# Patient Record
Sex: Female | Born: 1940 | Race: Black or African American | Hispanic: No | Smoking: Never smoker
Health system: Southern US, Community
[De-identification: ages and names within clinical notes are randomized; demographics above are authoritative.]

## PROBLEM LIST (undated history)

## (undated) DIAGNOSIS — I119 Hypertensive heart disease without heart failure: Secondary | ICD-10-CM

## (undated) DIAGNOSIS — E119 Type 2 diabetes mellitus without complications: Secondary | ICD-10-CM

## (undated) DIAGNOSIS — I5189 Other ill-defined heart diseases: Secondary | ICD-10-CM

## (undated) DIAGNOSIS — M8580 Other specified disorders of bone density and structure, unspecified site: Secondary | ICD-10-CM

## (undated) DIAGNOSIS — IMO0001 Reserved for inherently not codable concepts without codable children: Secondary | ICD-10-CM

## (undated) DIAGNOSIS — I700 Atherosclerosis of aorta: Secondary | ICD-10-CM

## (undated) DIAGNOSIS — I455 Other specified heart block: Secondary | ICD-10-CM

## (undated) DIAGNOSIS — N6009 Solitary cyst of unspecified breast: Secondary | ICD-10-CM

## (undated) DIAGNOSIS — M1990 Unspecified osteoarthritis, unspecified site: Secondary | ICD-10-CM

## (undated) DIAGNOSIS — I2510 Atherosclerotic heart disease of native coronary artery without angina pectoris: Secondary | ICD-10-CM

## (undated) DIAGNOSIS — K219 Gastro-esophageal reflux disease without esophagitis: Secondary | ICD-10-CM

## (undated) DIAGNOSIS — E785 Hyperlipidemia, unspecified: Secondary | ICD-10-CM

## (undated) DIAGNOSIS — I4719 Other supraventricular tachycardia: Secondary | ICD-10-CM

## (undated) DIAGNOSIS — G629 Polyneuropathy, unspecified: Secondary | ICD-10-CM

## (undated) DIAGNOSIS — Q7649 Other congenital malformations of spine, not associated with scoliosis: Secondary | ICD-10-CM

## (undated) DIAGNOSIS — N183 Chronic kidney disease, stage 3 unspecified: Secondary | ICD-10-CM

## (undated) DIAGNOSIS — T4145XA Adverse effect of unspecified anesthetic, initial encounter: Secondary | ICD-10-CM

## (undated) DIAGNOSIS — I10 Essential (primary) hypertension: Secondary | ICD-10-CM

## (undated) DIAGNOSIS — T7840XA Allergy, unspecified, initial encounter: Secondary | ICD-10-CM

## (undated) DIAGNOSIS — I471 Supraventricular tachycardia: Secondary | ICD-10-CM

## (undated) DIAGNOSIS — D649 Anemia, unspecified: Secondary | ICD-10-CM

## (undated) HISTORY — PX: ABDOMINAL HYSTERECTOMY: SHX81

## (undated) HISTORY — DX: Solitary cyst of unspecified breast: N60.09

## (undated) HISTORY — DX: Supraventricular tachycardia: I47.1

## (undated) HISTORY — DX: Other supraventricular tachycardia: I47.19

## (undated) HISTORY — DX: Hypertensive heart disease without heart failure: I11.9

## (undated) HISTORY — DX: Anemia, unspecified: D64.9

## (undated) HISTORY — DX: Hyperlipidemia, unspecified: E78.5

## (undated) HISTORY — DX: Atherosclerotic heart disease of native coronary artery without angina pectoris: I25.10

## (undated) HISTORY — PX: BREAST LUMPECTOMY: SHX2

## (undated) HISTORY — DX: Other ill-defined heart diseases: I51.89

## (undated) HISTORY — DX: Essential (primary) hypertension: I10

## (undated) HISTORY — PX: ABDOMINAL ADHESION SURGERY: SHX90

## (undated) HISTORY — DX: Type 2 diabetes mellitus without complications: E11.9

## (undated) HISTORY — PX: COLONOSCOPY: SHX5424

## (undated) HISTORY — DX: Other specified disorders of bone density and structure, unspecified site: M85.80

## (undated) HISTORY — DX: Unspecified osteoarthritis, unspecified site: M19.90

## (undated) HISTORY — PX: ANUS SURGERY: SHX302

## (undated) HISTORY — DX: Chronic kidney disease, stage 3 unspecified: N18.30

## (undated) HISTORY — PX: BREAST BIOPSY: SHX20

## (undated) HISTORY — PX: APPENDECTOMY: SHX54

## (undated) HISTORY — DX: Atherosclerosis of aorta: I70.0

## (undated) HISTORY — DX: Other specified heart block: I45.5

---

## 1984-01-12 DIAGNOSIS — T8859XA Other complications of anesthesia, initial encounter: Secondary | ICD-10-CM

## 1984-01-12 HISTORY — DX: Other complications of anesthesia, initial encounter: T88.59XA

## 1984-01-12 HISTORY — PX: CHOLECYSTECTOMY: SHX55

## 1997-07-01 ENCOUNTER — Ambulatory Visit (HOSPITAL_COMMUNITY): Admission: RE | Admit: 1997-07-01 | Discharge: 1997-07-01 | Payer: Self-pay | Admitting: Rheumatology

## 1997-11-06 ENCOUNTER — Ambulatory Visit (HOSPITAL_BASED_OUTPATIENT_CLINIC_OR_DEPARTMENT_OTHER): Admission: RE | Admit: 1997-11-06 | Discharge: 1997-11-06 | Payer: Self-pay | Admitting: Orthopedic Surgery

## 1999-08-19 ENCOUNTER — Encounter: Payer: Self-pay | Admitting: Internal Medicine

## 1999-08-19 ENCOUNTER — Encounter: Admission: RE | Admit: 1999-08-19 | Discharge: 1999-08-19 | Payer: Self-pay | Admitting: Rheumatology

## 1999-11-16 ENCOUNTER — Encounter: Payer: Self-pay | Admitting: *Deleted

## 1999-11-16 ENCOUNTER — Emergency Department (HOSPITAL_COMMUNITY): Admission: EM | Admit: 1999-11-16 | Discharge: 1999-11-17 | Payer: Self-pay | Admitting: *Deleted

## 2000-05-31 ENCOUNTER — Emergency Department (HOSPITAL_COMMUNITY): Admission: EM | Admit: 2000-05-31 | Discharge: 2000-05-31 | Payer: Self-pay | Admitting: Emergency Medicine

## 2000-08-09 ENCOUNTER — Ambulatory Visit (HOSPITAL_COMMUNITY): Admission: RE | Admit: 2000-08-09 | Discharge: 2000-08-09 | Payer: Self-pay | Admitting: Orthopedic Surgery

## 2000-08-09 ENCOUNTER — Encounter: Payer: Self-pay | Admitting: Orthopedic Surgery

## 2000-08-24 ENCOUNTER — Encounter: Payer: Self-pay | Admitting: Orthopedic Surgery

## 2000-08-24 ENCOUNTER — Ambulatory Visit (HOSPITAL_COMMUNITY): Admission: RE | Admit: 2000-08-24 | Discharge: 2000-08-24 | Payer: Self-pay | Admitting: Orthopedic Surgery

## 2001-12-15 ENCOUNTER — Emergency Department (HOSPITAL_COMMUNITY): Admission: EM | Admit: 2001-12-15 | Discharge: 2001-12-15 | Payer: Self-pay | Admitting: Emergency Medicine

## 2001-12-15 ENCOUNTER — Encounter: Payer: Self-pay | Admitting: Emergency Medicine

## 2002-11-19 ENCOUNTER — Emergency Department (HOSPITAL_COMMUNITY): Admission: EM | Admit: 2002-11-19 | Discharge: 2002-11-19 | Payer: Self-pay

## 2004-06-09 ENCOUNTER — Ambulatory Visit (HOSPITAL_COMMUNITY): Admission: RE | Admit: 2004-06-09 | Discharge: 2004-06-09 | Payer: Self-pay | Admitting: Cardiology

## 2005-10-08 ENCOUNTER — Other Ambulatory Visit: Admission: RE | Admit: 2005-10-08 | Discharge: 2005-10-08 | Payer: Self-pay | Admitting: Internal Medicine

## 2005-11-12 ENCOUNTER — Encounter: Admission: RE | Admit: 2005-11-12 | Discharge: 2005-11-12 | Payer: Self-pay | Admitting: Internal Medicine

## 2006-03-20 ENCOUNTER — Emergency Department (HOSPITAL_COMMUNITY): Admission: EM | Admit: 2006-03-20 | Discharge: 2006-03-20 | Payer: Self-pay | Admitting: Emergency Medicine

## 2006-05-03 ENCOUNTER — Emergency Department (HOSPITAL_COMMUNITY): Admission: EM | Admit: 2006-05-03 | Discharge: 2006-05-03 | Payer: Self-pay | Admitting: Emergency Medicine

## 2006-07-08 ENCOUNTER — Emergency Department (HOSPITAL_COMMUNITY): Admission: EM | Admit: 2006-07-08 | Discharge: 2006-07-09 | Payer: Self-pay | Admitting: Obstetrics and Gynecology

## 2006-12-10 ENCOUNTER — Inpatient Hospital Stay (HOSPITAL_COMMUNITY): Admission: EM | Admit: 2006-12-10 | Discharge: 2006-12-13 | Payer: Self-pay | Admitting: Emergency Medicine

## 2008-10-31 ENCOUNTER — Encounter: Admission: RE | Admit: 2008-10-31 | Discharge: 2008-10-31 | Payer: Self-pay | Admitting: Internal Medicine

## 2008-11-08 ENCOUNTER — Encounter: Admission: RE | Admit: 2008-11-08 | Discharge: 2008-11-08 | Payer: Self-pay | Admitting: Internal Medicine

## 2008-11-11 ENCOUNTER — Encounter: Admission: RE | Admit: 2008-11-11 | Discharge: 2008-11-11 | Payer: Self-pay | Admitting: Internal Medicine

## 2008-11-15 ENCOUNTER — Encounter: Admission: RE | Admit: 2008-11-15 | Discharge: 2008-11-15 | Payer: Self-pay | Admitting: Internal Medicine

## 2008-12-19 ENCOUNTER — Encounter: Admission: RE | Admit: 2008-12-19 | Discharge: 2008-12-19 | Payer: Self-pay | Admitting: General Surgery

## 2008-12-20 ENCOUNTER — Ambulatory Visit (HOSPITAL_BASED_OUTPATIENT_CLINIC_OR_DEPARTMENT_OTHER): Admission: RE | Admit: 2008-12-20 | Discharge: 2008-12-20 | Payer: Self-pay | Admitting: General Surgery

## 2008-12-20 ENCOUNTER — Encounter: Admission: RE | Admit: 2008-12-20 | Discharge: 2008-12-20 | Payer: Self-pay | Admitting: General Surgery

## 2010-02-01 ENCOUNTER — Encounter: Payer: Self-pay | Admitting: Internal Medicine

## 2010-03-04 ENCOUNTER — Other Ambulatory Visit: Payer: Self-pay | Admitting: Internal Medicine

## 2010-03-04 DIAGNOSIS — Z1231 Encounter for screening mammogram for malignant neoplasm of breast: Secondary | ICD-10-CM

## 2010-03-26 ENCOUNTER — Ambulatory Visit
Admission: RE | Admit: 2010-03-26 | Discharge: 2010-03-26 | Disposition: A | Discharge status: Home / Self Care | Payer: Medicaid Other | Source: Ambulatory Visit | Attending: Internal Medicine | Admitting: Internal Medicine

## 2010-03-26 DIAGNOSIS — Z1231 Encounter for screening mammogram for malignant neoplasm of breast: Secondary | ICD-10-CM

## 2010-04-14 LAB — BASIC METABOLIC PANEL
BUN: 10 mg/dL (ref 6–23)
Calcium: 9.4 mg/dL (ref 8.4–10.5)
Creatinine, Ser: 0.8 mg/dL (ref 0.4–1.2)
GFR calc non Af Amer: >60 mL/min (ref >60)
Glucose, Bld: 207 mg/dL — ABNORMAL HIGH (ref 70–99)

## 2010-04-14 LAB — CBC
Platelets: 191 10*3/uL (ref 150–400)
RDW: 15.2 % (ref 11.5–15.5)

## 2010-04-14 LAB — DIFFERENTIAL
Basophils Absolute: 0 10*3/uL (ref 0.0–0.1)
Basophils Relative: 0 % (ref 0–1)
Lymphocytes Relative: 31 % (ref 12–46)
Neutro Abs: 3.8 10*3/uL (ref 1.7–7.7)

## 2010-04-14 LAB — GLUCOSE, CAPILLARY
Glucose-Capillary: 117 mg/dL — ABNORMAL HIGH (ref 70–99)
Glucose-Capillary: 138 mg/dL — ABNORMAL HIGH (ref 70–99)

## 2010-05-26 NOTE — H&P (Signed)
NAMEMUNIRA, POLSON           ACCOUNT NO.:  [id number]   MEDICAL RECORD NO.:  [id number]          PATIENT TYPE:  INP   LOCATION:  4729                         FACILITY:  [facility]   PHYSICIAN:  Kela Millin, M.D.DATE OF BIRTH:  02-12-1940   DATE OF ADMISSION:  12/10/2006  DATE OF DISCHARGE:  07/09/2006                              HISTORY & PHYSICAL   PRIMARY CARE PHYSICIAN:   RHEUMATOLOGIST:  Dr. Coral Spikes.   CHIEF COMPLAINT:  Coffee ground emesis in the ER.   HISTORY OF PRESENT ILLNESS:  The patient is a 70 year old black female  with past medical history significant for gout intermittently on  prednisone per her rheumatologist, and also daily BC powder use (2 times  a day), and ibuprofen on average once a month, other history of diabetes  and hypertension, who presents with above complaints.  She says that she  was in her usual state of health until the p.m. prior to admission when  she began having nausea and vomiting.  She states that she really did  not notice that it was dark or bloody until she came to the ER and  vomited a large amount of coffee-ground emesis.  Per family, the patient  had eaten dinner at a local seafood restaurant, and after she went home  she began having nausea and vomiting and complaint of some abdominal  pain that was generalized and radiating to her back.  It is noted per ER  records that the patient had a syncopal episode at home, although at the  time of this interview along with family at bedside do not report any  syncopal episode.  The patient denies diarrhea and admits to one  melanotic stool about a week prior to admission.  The patient also  states that on and off she has had stomach problems and earlier this  year her gastroenterologist, Dr. Madilyn Fireman, did an EGD and per patient  report this was within normal limits.   In the ER lab work revealed an H&H of 9.1 and 28.2, her blood pressure  initially 113/36 but decreased to 97/46 on  recheck.  Gastroenterology  was consulted and Dr. Bosie Clos saw the patient in the ER and a medicine  service admission was requested for further evaluation and management.   PAST MEDICAL HISTORY:  As above.   MEDICATIONS:  1. Metoprolol 100 mg daily  2. Colchicine 0.6 mg b.i.d.  3. Prednisone -- states that she has not taken any prednisone in      several months.  4. Allopurinol 100 mg 2 tablets daily  5. Glyburide 5 mg daily   ALLERGIES:  CODEINE.   SOCIAL HISTORY:  She denies tobacco.  She also denies alcohol.   SOCIAL HISTORY:  Her brother had pancreatic cancer and her sister  cervical cancer.  Her mother had an MI in her 53's.  Her sister had  hypertension with renal failure.   REVIEW OF SYSTEMS:  As per HPI, other review of systems negative.   PHYSICAL EXAMINATION:  IN GENERAL:  The patient is a pleasant, elderly  black female in no apparent distress.  VITAL SIGNS:  Temperature 97.1, blood pressure 147/77, pulse 87,  respiratory rate 16, Oxygen saturation of 100%.  HEENT:  PERRL, EOMI, dry mucous membranes, sclerae anicteric.  NECK:  Supple, no adenopathy, no thyromegaly and no JVD.  LUNGS:  Decreased breath sounds at the bases, otherwise clear to  auscultation.  No wheezes.  CARDIOVASCULAR:  Regular rate and rhythm, normal S1, S2.  ABDOMEN:  Soft, bowel sounds present, nontender, nondistended.  No  organomegaly and no masses palpable.  EXTREMITIES:  No cyanosis and no edema.  NEURO:  Cranial nerves II-XII are grossly intact, strength 4-5/5 and  symmetric.  Nonfocal exam.   LABORATORY DATA:  Occult blood, gastric - positive.  Point of care  markers negative.  White cell count is 11.2 with a hemoglobin of 9.1,  hematocrit 28.2, platelet count 206, neutrophil count 63%.  Sodium is  140 with potassium of 3.5, chloride 103, CO2 of 27, glucose 258, BUN 29,  creatinine of 0.99, calcium 8.5, total protein is 5.9, albumin 2.8.  AST  is 22, lipase is 33. Urinalysis nitrite  negative, small leukocyte  esterase with 7-10 WBC and many bacteria.  Chest x-ray:  Low lung volumes with cardiomegaly, no acute  cardiopulmonary findings.   ASSESSMENT AND PLAN:  1. Upper GI bleed.  As discussed above, likely NSAID-induced.      Gastroenterology consulted and EGD done per Dr. Bosie Clos revealing      a pre-pyloric ulcer with edema, blood clots noted in stomach and      another non-bleeding ulcer also noted.  IV fluids/ fluid      resuscitation, monitor H&H and transfuse as appropriate.  The      patient started on PPI, GI following.  2. Probable UTI.  Obtain urine cultures, place on empiric antibiotics.  3. Hypertension.  Monitor blood pressure and treat as appropriate.      Hold off anti-hypertensives for now secondary to #1.  4. Diabetes mellitus.  Monitor Accu-Cheks, cover with sliding scale,      and resume Glyburide when tolerating p.o.  5. Gout.  Continue Allopurinol.      Kela Millin, M.D.  Electronically Signed     ACV/MEDQ  D:  12/11/2006  T:  12/11/2006  Job:  [id number]   cc:   Demetria Pore. Coral Spikes, M.D.

## 2010-05-26 NOTE — Consult Note (Signed)
NAMEWILDER, AMODEI           ACCOUNT NO.:  [id number]   MEDICAL RECORD NO.:  [id number]          PATIENT TYPE:  EMS   LOCATION:  MAJO                         FACILITY:  [facility]   PHYSICIAN:  Shirley Friar, MDDATE OF BIRTH:  July 01, 1940   DATE OF CONSULTATION:  12/10/2006  DATE OF DISCHARGE:                                 CONSULTATION   REASON FOR CONSULTATION:  Gastrointestinal bleed.   HISTORY OF PRESENT ILLNESS:  Nancy [name] as a 70 year old black female  being seen in consultation at the request of Dr. Weldon Inches in regards to  GI bleed.  She has a history of gout, diabetes, hypertension and  headaches and presented with acute onset of nausea, vomiting and  abdominal pain yesterday while at a restaurant.  Her family also reports  that she had syncope.  She vomited coffee ground emesis last night and  several times this a.m.  She was initially hypotensive in the 90's per a  report from EMS through the ER staff.  She is now in the 110'2 to 140's  blood pressure wise.  Most recent check is 97 systolic.  Hemoglobin is  9.1 and BUN 29.  She does report taking several BC powders daily for  years for headaches.  She denies any previous history of ulcers.  She  reports she had a colonoscopy and upper endoscopy this year but the  results are not available at the time of this dictation.  She currently  has an NG tube down and has about 200 mL of coffee ground emesis in it.  She denies any bright red blood with the vomiting and denies any  hematochezia or melena.   PAST MEDICAL HISTORY:  Diabetes, gout, hypertension, history of  headaches.   MEDICATIONS:  Metoprolol, colchicine, prednisone, allopurinol,  glyburide.   FAMILY HISTORY:  Noncontributory.   SOCIAL HISTORY:  Denies cigarettes, alcohol or tobacco.   ALLERGIES:  Codeine.   PHYSICAL EXAMINATION:  VITAL SIGNS:  Temperature 97.1, pulse 80, blood  pressure 113/36.  GENERAL:  Lethargic, mild acute distress.  ABDOMEN:   Soft, nontender, nondistended.  Positive bowel sounds.   LABS:  Hemoglobin 9.1, white blood count of 11.2, platelet count 206,  BUN 29, creatinine 0.99.   IMPRESSION:  A 70 year old black female with upper gastrointestinal  bleed concerning for bleeding ulcer especially due to her NSAID use and  chronic steroid use.   PLAN:  Would be for upper endoscopy this a.m. to evaluate and try to  treat any active bleeding.  I will also put her on a Protonix continuous  drip at 8 mg per hour.  Will follow her H and H's closely and transfuse  as needed.  Will ask [facility] hospitalist to admit the patient due to her  multiple co-morbidities to help with management for that.      Shirley Friar, MD  Electronically Signed     VCS/MEDQ  D:  12/10/2006  T:  12/10/2006  Job:  [id number]   cc:   Demetria Pore. Coral Spikes, M.D.

## 2010-05-26 NOTE — Op Note (Signed)
NAMETAKILA, KRONBERG           ACCOUNT NO.:  [id number]   MEDICAL RECORD NO.:  [id number]          PATIENT TYPE:  EMS   LOCATION:  MAJO                         FACILITY:  [facility]   PHYSICIAN:  Shirley Friar, MDDATE OF BIRTH:  Sep 14, 1940   DATE OF PROCEDURE:  DATE OF DISCHARGE:                               OPERATIVE REPORT   PROCEDURE:  Upper endoscopy.   INDICATION:  Hematemesis.   MEDICATIONS:  Fentanyl 50 mcg IV, Versed 4 mg IV.   FINDINGS:  The endoscope was inserted through the oropharynx and the  esophagus was intubated.  The endoscope was advanced down to the stomach  where a large amount of dark colored and black colored blood products  were noted in the stomach.  The NG tube was removed during the procedure  and upon removing the NG tube better visualization of the stomach was  obtained.  On careful inspection of the mucosal lining there was an 8 mm  ulcer in the prepyloric channel with a small amount of flat red  appearance in the base.  No obvious vessel and no vessel protuberance  was seen.  The endoscope was carefully advanced through this area into  the pyloric channel which was edematous and down into the duodenal bulb.  No mucosal abnormalities were seen in the duodenal bulb.  There was a  trace amount of black colored material in that area.  The endoscope was  passed to the second portion of the duodenum which again revealed black  colored material but no mucosal abnormalities.  The endoscope was  withdrawn back into the stomach.  Retroflexion revealed a small hiatal  hernia.  Repeated irrigation and aspiration of the black colored  material in the fundus was attempted but the entire material could not  be aspirated completely due to the semisolid amount.  This prevented  adequate visualization of the entire fundus.  No active bleeding was  seen.  On repeated irrigation there was some fresh blood noted on the  lesser curvature of the stomach that appeared  to be scope trauma  related.  The ulcer in the prepyloric channel was again reevaluated and  there was a small amount of oozing around the ulcer which appeared to be  due to scope trauma and this spontaneously subsided.  The decision was  made not to do any therapeutic maneuvers to the ulcer since there was no  evidence of any active bleeding and no evidence of visible vessel.  The  endoscope was withdrawn back to the esophagus and no mucosal  abnormalities were seen at the GE junction nor in the distal esophagus.  The endoscope was then withdrawn to confirm above findings.   ASSESSMENT:  1. Gastric ulcer in prepyloric channel, likely secondary to NSAIDs.  2. Old blood in stomach prevented adequate visualization of entire      fundus.   PLAN:  1. Liquid diet.  2. Leave NG tube out if possible.  3. Start IV Protonix continuous infusion.  4. Follow H and H's.  5. Check Helicobacter pylori serology and treat if positive.  6. Avoid NSAIDs.  Shirley Friar, MD  Electronically Signed     VCS/MEDQ  D:  12/10/2006  T:  12/10/2006  Job:  [id number]   cc:   Demetria Pore. Coral Spikes, M.D.  John C. Madilyn Fireman, M.D.

## 2010-05-29 NOTE — Discharge Summary (Signed)
Nancy [name], Nancy [name]           ACCOUNT NO.:  [id number]   MEDICAL RECORD NO.:  [id number]          PATIENT TYPE:  INP   LOCATION:  4729                         FACILITY:  [facility]   PHYSICIAN:  Ramiro Harvest, MD    DATE OF BIRTH:  17-Jun-1940   DATE OF ADMISSION:  12/10/2006  DATE OF DISCHARGE:  12/13/2006                               DISCHARGE SUMMARY   ATTENDING PHYSICIAN:  Ramiro Harvest, MD.   GASTROENTEROLOGIST:  Shirley Friar, MD.   PCP: [facility]   DISCHARGE DIAGNOSES:  1. Upper gastrointestinal bleed secondary to a gastric ulcer in the      prepyloric channel likely NSAID induced.  Helicobacter pylori      positive.  2. Gastric ulcer likely secondary to NSAID induced.  3. Urinary tract infection.  4. Hypertension.  5. Diabetes mellitus type 2.  6. Hypokalemia.  7. Gout.  8. History of headaches.  9. Status post cholecystectomy.   DISCHARGE MEDICATIONS:  1. Prevpac 1 tab p.o. b.i.d. x14 days.  2. Omeprazole 40 mg p.o. daily x2 months.  Start after Prevpac is      finished.  3. Allopurinol 200 mg p.o. daily.  4. Colchicine 0.6 mg p.o. b.i.d.  5. Glyburide 10 mg p.o. b.i.d.  6. Metoprolol 100 mg p.o. daily.  7. Prednisone 5 mg p.o. p.r.n.  8. Lisinopril 40 mg p.o. daily.  9. Metformin 500 mg p.o. daily.  10.K-Dur 40 mEq p.o. x1 on December 14, 2006.   DISPOSITION AND FOLLOWUP:  The patient will be discharged home.  The  patient is to call to schedule a followup appointment with a primary  care physician in 1-2 weeks.  The patient is also to call to schedule a  followup appointment with Dr. Bosie Clos in 6 weeks.  Dr. Marge Duncans  number is [phone].  On followup the patient's PCP will need to check  the patient's basic metabolic profile to follow up the patient's  electrolytes.  The patient's blood pressure and hypertension will also  need to be reassessed per PCP.   PROCEDURES PERFORMED:  A chest x-ray was performed on December 10, 2006,  that  showed low lung volumes and cardiomegaly.  No acute cardiopulmonary  findings.  An EGD was done on December 10, 2006, which was done by Dr.  Shirley Friar which showed a gastric ulcer in the prepyloric  channel secondary to NSAIDs.  Old blood in the stomach prevented  adequate visualization of entire fundus.  Also a transfusion, blood  transfusion was done, 2 units packed red blood cells was done on  December 11, 2006.   HOSPITAL CONSULTATIONS:  Gastroenterology consult was done.  The patient  was seen by [facility], Dr. Bosie Clos on December 10, 2006.   BRIEF ADMISSION HISTORY AND PHYSICAL:  Nancy [name] is a 70-  year-old African American female with past medical history significant  for gout intermittently on prednisone per rheumatologist and also on  daily BC powder use twice a day and ibuprofen on average once a month  with other history of diabetes and hypertension who presented with  complaints of coffee ground  emesis to the ED.  The patient stated that  she was in her usual state of health until the night prior to admission  when she began having some nausea and vomiting.  The patient states that  she really did not notice that it was dark or bloody until she came to  the ED and vomited a large amount of coffee ground emesis.  Per family  the patient had eaten dinner at a local seafood restaurant and after she  went home she began having and vomiting and complained of some abdominal  pain that was generalized and radiating to her back.  It was noticed per  her ED record that the patient had a syncopal episode at home although  at the time of interview along with family at bedside the patient did  not report any syncopal episode.  The patient denied any diarrhea,  admits to one melenic stool about 1 week prior to admission.  The  patient also stated that on and off she has been having stomach problems  and had been to see gastroenterologist, Dr. Madilyn Fireman, had done an EGD  of  the patient who reports this was within normal limits.  In the ED lab  work revealed an H and H of 9.1 and 28.2.  Blood pressure initially  113/36 but decreased to 97/46.  Gastroenterology was consulted.  The  patient was seen by Dr. Bosie Clos in the ED and medicine service  admission was requested for further evaluation and management.   PHYSICAL EXAMINATION:  VITAL SIGNS:  Temperature 97.1, blood pressure  147/77, pulse of 87, respiratory rate 16, satting 100%.  GENERAL:  The patient is a pleasant, elderly black female in no apparent  distress.  HEENT:  Normocephalic, atraumatic.  Pupils equal, round and reactive to  light.  Extraocular movements intact.  Mucous membranes were dry.  Sclerae was anicteric.  NECK:  Supple.  No lymphadenopathy.  No thyromegaly.  No JVD.  RESPIRATORY:  Decreased breath sounds of the bases otherwise clear to  auscultation bilaterally.  No wheezes, no rhonchi.  CARDIOVASCULAR:  Regular rate and rhythm.  Normal S1, S2.  ABDOMEN:  Was soft.  Nontender.  Nondistended.  Positive bowel sounds.  No organomegaly.  No masses palpable.  EXTREMITIES:  No clubbing, cyanosis or edema.  NEUROLOGICAL:  Cranial nerves II-XII are grossly intact.  Strength was 4-  5/5 bilaterally and symmetrically and nonfocal exam.   ADMISSION LABS:  Occult gastric positive.  Point of care cardiac markers  were negative.  White cell count 11.2, hemoglobin 9.1, hematocrit 28.2,  platelet count 208, neutrophil count 63%, sodium 140, potassium 3.5,  chloride 103, bicarb 27, glucose 258, BUN 29, creatinine 0.99.  Calcium  of 8.5, total protein of 5.9, albumin 2.8, AST 22, lipase 33.  Urinalysis is nitrate negative, small leukocyte esterase with 7-10 white  blood cells and many bacteria.  Chest x-ray low lung volumes with  cardiomegaly.  No acute cardiopulmonary findings.   HOSPITAL COURSE:  1. Upper gastrointestinal bleed.  The patient had initially presented      with coffee ground  emesis.  It was felt this was likely NSAID      induced and gastroenterology was consulted and EGD was done per Dr.      Bosie Clos which revealed prepyloric ulcer with edema, blood clots      also noted in the stomach and another nonbleeding ulcer was also      noted.  The patient was placed  on IV fluids and was fluid      resuscitated.  The patient was initially placed n.p.o.  Hemoglobin      was monitored and the patient was started on a proton pump      inhibitor drip.  GI followed the patient throughout the      hospitalization.  Initially the patient's hemoglobin dropped to      7.0.  The patient was transfused 2 units packed red blood cells.      Helicobacter pylori was also sent after GI was done.  The patient's      hemoglobin remained stable for the rest of the hospitalization.      The patient's proton pump inhibitor was subsequently changed to      b.i.d. Protonix.  CBCs were checked every 8 hours.  The patient's      diet was advanced from n.p.o. to clear liquids.  The patient's      hemoglobin remained stable throughout the hospitalization.  The      patient's Helicobacter pylori came back positive.  The patient was      placed on Prevpac on day prior to discharge.  The patient was      discharged home on Prevpac for a 14 day course of it and then to      continue omeprazole 40 mg daily for 2 months thereafter.  To follow      up with Dr. Bosie Clos in 6 weeks for possible re-endoscopy.  The      patient was stable throughout the rest of the hospitalization and      the patient was discharged in a stable and improved condition.  2. Gastric ulcer secondary to Helicobacter pylori positive.  See      problem #1.  The patient was discharged home on Prevpac and      omeprazole.  3. Urinary tract infection.  It was noted that the patient had a      urinary tract infection initially after urinalysis was done.  Urine      cultures came back with greater than 100,000 of E. coli which  was      sensitive to ciprofloxacin.  The patient was treated with      ciprofloxacin x3 days.  The patient was discharged in a stable and      improved condition.  4. Hypertension.  The patient's blood pressure medications were      initially held on day of admission.  The patient's metoprolol was      then restarted and the patient's blood pressure remained stable      throughout the hospitalization.  The patient was discharged back on      home regimen of lisinopril and metoprolol on discharge.  The      patient is to follow up with PCP on discharge.  5. Type 2 diabetes.  The patient was initially placed on a sliding      scale insulin as she was n.p.o. and CBGs were checked every 4 hours      while she was n.p.o. and then switched to a.c. and at bedtime.      Once the patient started tolerating p.o. the patient was restarted      on the home dose of glyburide.  The patient's CBGs remained stable      throughout her hospitalization.  The patient was discharged back      home on her home regimen of metformin and glyburide.  6.  Hypokalemia.  The patient became hypokalemic throughout      hospitalization.  This could have been likely secondary to problem      #1.  The patient's potassium was repleted and the patient was given      one more dose of K-Dur 40 mEq to be taken the next day after      discharge.  The patient will need a BMET checked by PCP to follow      up on her hypokalemia.  The patient was discharged in stable and      improved condition.  7. Gout, stable.  The patient was maintained on her home dose of      allopurinol throughout the hospitalization and discharged home back      on the home regimen of allopurinol and colchicine.   The rest of the patient's chronic medical history were stable throughout  the hospitalization.  The patient was discharged in a stable and  improved condition to follow up with PCP in 2 weeks and with GI in 6  weeks.  On day of discharge vital  signs:  Temperature of 98.8, pulse of  72, blood pressure was 136/78, satting 94% on room air.  CBGs ranged  from 77 through 187.   DISCHARGE LABS:  BMET - sodium 142, potassium was 2.9, chloride 108,  bicarb 28, BUN 4, creatinine 0.89, glucose of 112, calcium of 8.5.  The  patient was given K-Dur prior to discharge and given a tablet of K-Dur  to be taken the next day at home.  To follow up with PCP for a basic  metabolic profile.  CBC on day of discharge, white count of 8.1,  hemoglobin 9.9, hematocrit 29.0, platelet count 146.   The patient was discharged in stable and improved condition to follow up  with PCP and gastroenterology as stated above.   It was a pleasure taking care of Ms. Gwyndolyn Kaufman.      Ramiro Harvest, MD  Electronically Signed     DT/MEDQ  D:  01/15/2007  T:  01/15/2007  Job:  [id number]   cc:   Demetria Pore. Coral Spikes, M.D.

## 2010-10-19 LAB — CBC
Hemoglobin: 9.7 — ABNORMAL LOW
MCHC: 33.4
MCV: 85.1
MCV: 85.1
Platelets: 146 — ABNORMAL LOW
RDW: 15.4
RDW: 15.7 — ABNORMAL HIGH
WBC: 8.1

## 2010-10-19 LAB — HEMOGLOBIN AND HEMATOCRIT, BLOOD
HCT: 32.3 — ABNORMAL LOW
Hemoglobin: 10.7 — ABNORMAL LOW
Hemoglobin: 9.6 — ABNORMAL LOW

## 2010-10-19 LAB — BASIC METABOLIC PANEL
BUN: 4 — ABNORMAL LOW
Chloride: 108
Creatinine, Ser: 0.89
Glucose, Bld: 112 — ABNORMAL HIGH

## 2010-10-20 LAB — COMPREHENSIVE METABOLIC PANEL
ALT: 21
AST: 22
Albumin: 2.8 — ABNORMAL LOW
Alkaline Phosphatase: 70
Calcium: 8.5
GFR calc Af Amer: >60
Potassium: 3.5
Sodium: 140
Total Protein: 5.9 — ABNORMAL LOW

## 2010-10-20 LAB — CBC
Hemoglobin: 9.1 — ABNORMAL LOW
MCHC: 32.3
Platelets: 206
RDW: 16.2 — ABNORMAL HIGH

## 2010-10-20 LAB — DIFFERENTIAL
Basophils Relative: 0
Eosinophils Absolute: 0.1 — ABNORMAL LOW
Lymphs Abs: 3.5
Monocytes Absolute: 0.4
Monocytes Relative: 4

## 2010-10-20 LAB — I-STAT 8, (EC8 V) (CONVERTED LAB)
BUN: 33 — ABNORMAL HIGH
Chloride: 106
Glucose, Bld: 237 — ABNORMAL HIGH
Hemoglobin: 9.5 — ABNORMAL LOW
Potassium: 3.7
Sodium: 144
pH, Ven: 7.344 — ABNORMAL HIGH

## 2010-10-20 LAB — CROSSMATCH: ABO/RH(D): A POS

## 2010-10-20 LAB — POCT I-STAT 3, ART BLOOD GAS (G3+)
O2 Saturation: 99
Operator id: 138421
pCO2 arterial: 35.5
pH, Arterial: 7.446 — ABNORMAL HIGH

## 2010-10-20 LAB — URINE MICROSCOPIC-ADD ON

## 2010-10-20 LAB — URINE CULTURE
Colony Count: >=100000
Special Requests: POSITIVE

## 2010-10-20 LAB — POCT CARDIAC MARKERS
Myoglobin, poc: 35.4
Operator id: 284251
Troponin i, poc: <0.05

## 2010-10-20 LAB — PREPARE RBC (CROSSMATCH)

## 2010-10-20 LAB — URINALYSIS, ROUTINE W REFLEX MICROSCOPIC
Glucose, UA: NEGATIVE
Hgb urine dipstick: NEGATIVE
Ketones, ur: NEGATIVE
Protein, ur: NEGATIVE
pH: 6

## 2010-10-20 LAB — HEMOGLOBIN AND HEMATOCRIT, BLOOD
HCT: 21.6 — ABNORMAL LOW
HCT: 25.5 — ABNORMAL LOW
Hemoglobin: 7 — CL
Hemoglobin: 9.6 — ABNORMAL LOW

## 2010-10-20 LAB — GASTRIC OCCULT BLOOD (1-CARD TO LAB): Occult Blood, Gastric: POSITIVE — AB

## 2010-10-28 LAB — CBC
HCT: 38
Platelets: 237
RDW: 16.2 — ABNORMAL HIGH

## 2010-10-28 LAB — DIFFERENTIAL
Basophils Relative: 1
Eosinophils Relative: 2
Lymphocytes Relative: 34
Monocytes Absolute: 0.7
Monocytes Relative: 8
Neutro Abs: 5

## 2010-10-28 LAB — COMPREHENSIVE METABOLIC PANEL
AST: 36
Albumin: 3.5
Alkaline Phosphatase: 101
BUN: 6
GFR calc Af Amer: >60
Potassium: 3.3 — ABNORMAL LOW
Total Protein: 7.8

## 2010-10-28 LAB — POCT CARDIAC MARKERS
CKMB, poc: 1
CKMB, poc: 1.4
Myoglobin, poc: 52.7
Troponin i, poc: <0.05

## 2010-12-22 ENCOUNTER — Encounter (INDEPENDENT_AMBULATORY_CARE_PROVIDER_SITE_OTHER): Payer: Self-pay | Admitting: General Surgery

## 2010-12-23 ENCOUNTER — Encounter (INDEPENDENT_AMBULATORY_CARE_PROVIDER_SITE_OTHER): Payer: Self-pay | Admitting: Surgery

## 2010-12-23 ENCOUNTER — Ambulatory Visit (INDEPENDENT_AMBULATORY_CARE_PROVIDER_SITE_OTHER): Payer: Medicare Other | Admitting: Surgery

## 2010-12-23 VITALS — BP 158/88 | HR 100 | Temp 98.8°F | Resp 24 | Ht 62.0 in | Wt 186.8 lb

## 2010-12-23 DIAGNOSIS — L089 Local infection of the skin and subcutaneous tissue, unspecified: Secondary | ICD-10-CM | POA: Insufficient documentation

## 2010-12-23 DIAGNOSIS — L723 Sebaceous cyst: Secondary | ICD-10-CM

## 2010-12-23 NOTE — Patient Instructions (Signed)
Change dressing twice daily. You may shower before dressng changes. Continue your antibiotic until it is all gone, See me for follow up next week

## 2010-12-23 NOTE — Progress Notes (Signed)
NAME: Nancy [name]                                                                                      DOB: 04-06-1940 DATE: 12/23/2010               MRN: [id number]   CC:  Chief Complaint  Patient presents with  . Abscess    urgent- eval lft breast cyst    HPI:  RAEGYN RENDA is a 70 y.o.  female who was referred  by Dr. Gregor Hams evaluation of Infected epidermoid cyst of the medial aspect of the left breast. It was I&D'd but still draining and not shrinking  PMH:  has a past medical history of Hypertension; Gout; Type II diabetes mellitus; Chest pain; Osteopenia; Gastric ulcer; Hyperlipidemia; Osteoarthritis; and Breast cyst.  PSH:   has past surgical history that includes Abdominal hysterectomy; Abdominal adhesion surgery; Breast lumpectomy; Cholecystectomy; Appendectomy; Anus surgery; and Breast biopsy.  ALLERGIES:   Allergies  Allergen Reactions  . Codeine     MEDICATIONS: Current outpatient prescriptions:allopurinol (ZYLOPRIM) 100 MG tablet, Take 100 mg by mouth daily.  , Disp: , Rfl: ;  AMLODIPINE BESYLATE PO, Take 10 mg by mouth daily.  , Disp: , Rfl: ;  aspirin 81 MG tablet, Take 81 mg by mouth daily.  , Disp: , Rfl: ;  colchicine (COLCRYS) 0.6 MG tablet, Take 0.6 mg by mouth daily.  , Disp: , Rfl: ;  gabapentin (NEURONTIN) 300 MG capsule, Take 300 mg by mouth 3 (three) times daily.  , Disp: , Rfl:  glyBURIDE (DIABETA) 5 MG tablet, Take 5 mg by mouth daily with breakfast.  , Disp: , Rfl: ;  lisinopril (PRINIVIL,ZESTRIL) 40 MG tablet, Take 40 mg by mouth daily.  , Disp: , Rfl: ;  loperamide (IMODIUM) 2 MG capsule, Take 2 mg by mouth as needed.  , Disp: , Rfl: ;  metFORMIN (GLUCOPHAGE) 1000 MG tablet, Take 1,000 mg by mouth 2 (two) times daily with a meal.  , Disp: , Rfl:  metoprolol (LOPRESSOR) 100 MG tablet, Take 100 mg by mouth 1 day or 1 dose.  , Disp: , Rfl: ;  omeprazole (PRILOSEC) 20 MG capsule, Take 20 mg by mouth daily.  , Disp: , Rfl: ;  predniSONE (DELTASONE) 5 MG  tablet, Take 5 mg by mouth daily.  , Disp: , Rfl: ;  sucralfate (CARAFATE) 1 G tablet, Take 1 g by mouth as needed.  , Disp: , Rfl:  traMADol (ULTRAM) 50 MG tablet, Take 50 mg by mouth every 6 (six) hours as needed. Maximum dose= 8 tablets per day , Disp: , Rfl:   ROS: Noted and basically negative  EXAM:   General: Patient alert, co-operatitve and no distress Breast; There is an infected sebaceous cyst in the very medial aspect of the breast with a small opening draining purulent material  DATA REVIEWED:  Notes from Dr Jillyn Hidden  IMPRESSION:  Infected sebaceous cyst,  PLAN:   I believe this needs to be excised today with local. Discussed with patient and she is agreeable  Procedure Note: I anesthetized the area with 1% Xylocaine with epi using  about 5 cc. I waited about 10 minutes. There was excellent anesthesia. I made an elliptical incision and took some of the overlying skin off. There was some sebum and and debris which I cleaned out. I packed it.  The patient tolerated the procedure well. She began routine wound care tomorrow.  ; Currie Paris 12/23/2010  CC: Candi Leash, MD, Candi Leash, MD

## 2010-12-25 ENCOUNTER — Telehealth (INDEPENDENT_AMBULATORY_CARE_PROVIDER_SITE_OTHER): Payer: Self-pay

## 2010-12-25 NOTE — Telephone Encounter (Signed)
Patient called and said she could not pack her wound. Talked to Dr Jamey Ripa and he said wound was superficial and to just let soap and water run over area, pat dry and put dry  dressing over area. Patient understood instructions.

## 2010-12-29 ENCOUNTER — Ambulatory Visit (INDEPENDENT_AMBULATORY_CARE_PROVIDER_SITE_OTHER): Payer: Medicare Other | Admitting: Surgery

## 2010-12-29 ENCOUNTER — Encounter (INDEPENDENT_AMBULATORY_CARE_PROVIDER_SITE_OTHER): Payer: Self-pay | Admitting: Surgery

## 2010-12-29 VITALS — BP 118/84 | HR 84 | Temp 97.2°F | Resp 20 | Ht 62.0 in | Wt 190.0 lb

## 2010-12-29 DIAGNOSIS — L723 Sebaceous cyst: Secondary | ICD-10-CM

## 2010-12-29 DIAGNOSIS — L089 Local infection of the skin and subcutaneous tissue, unspecified: Secondary | ICD-10-CM

## 2010-12-29 NOTE — Patient Instructions (Signed)
Continue to take care of the incision as you have been doing. If it does not completely heal in 2-3 weeks make another appointment to see me. Occasionally their is a little residual cyst that has to be removed

## 2010-12-29 NOTE — Progress Notes (Signed)
The patient comes back for followup where we opened and removed a small epidermoid cyst of the left medial breast area.  The patient feels like this area is improving.  Exam: Areas some granulation is forming. There was still a little sebum and cyst wall which was able to easily debride out since we were left as is clean granulation tissues.  Impression resolving epidermoid abscess Plan: continue local wound care and see her back p.r.n. if this fails to heal. I did tell her sometime this had to be excised.

## 2011-07-09 ENCOUNTER — Emergency Department (HOSPITAL_COMMUNITY): Payer: PRIVATE HEALTH INSURANCE

## 2011-07-09 ENCOUNTER — Emergency Department (HOSPITAL_COMMUNITY)
Admission: EM | Admit: 2011-07-09 | Discharge: 2011-07-09 | Disposition: A | Discharge status: Home / Self Care | Payer: PRIVATE HEALTH INSURANCE | Attending: Emergency Medicine | Admitting: Emergency Medicine

## 2011-07-09 ENCOUNTER — Encounter (HOSPITAL_COMMUNITY): Payer: Self-pay

## 2011-07-09 DIAGNOSIS — R0602 Shortness of breath: Secondary | ICD-10-CM

## 2011-07-09 DIAGNOSIS — R0789 Other chest pain: Secondary | ICD-10-CM | POA: Insufficient documentation

## 2011-07-09 DIAGNOSIS — E119 Type 2 diabetes mellitus without complications: Secondary | ICD-10-CM | POA: Insufficient documentation

## 2011-07-09 DIAGNOSIS — I10 Essential (primary) hypertension: Secondary | ICD-10-CM | POA: Insufficient documentation

## 2011-07-09 DIAGNOSIS — R7989 Other specified abnormal findings of blood chemistry: Secondary | ICD-10-CM

## 2011-07-09 DIAGNOSIS — Z79899 Other long term (current) drug therapy: Secondary | ICD-10-CM | POA: Insufficient documentation

## 2011-07-09 DIAGNOSIS — R799 Abnormal finding of blood chemistry, unspecified: Secondary | ICD-10-CM | POA: Insufficient documentation

## 2011-07-09 LAB — BASIC METABOLIC PANEL
BUN: 17 mg/dL (ref 6–23)
Creatinine, Ser: 0.97 mg/dL (ref 0.50–1.10)
GFR calc Af Amer: 68 mL/min — ABNORMAL LOW (ref >90)
GFR calc non Af Amer: 58 mL/min — ABNORMAL LOW (ref >90)

## 2011-07-09 LAB — CBC WITH DIFFERENTIAL/PLATELET
Basophils Relative: 0 % (ref 0–1)
Eosinophils Absolute: 0 10*3/uL (ref 0.0–0.7)
MCH: 28.3 pg (ref 26.0–34.0)
MCHC: 34.3 g/dL (ref 30.0–36.0)
Neutrophils Relative %: 67 % (ref 43–77)
Platelets: 255 10*3/uL (ref 150–400)
RDW: 14.9 % (ref 11.5–15.5)

## 2011-07-09 MED ORDER — IOHEXOL 350 MG/ML SOLN
62.0000 mL | Freq: Once | INTRAVENOUS | Status: AC | PRN
  Administered 2011-07-09: 62 mL via INTRAVENOUS

## 2011-07-09 NOTE — Discharge Instructions (Signed)
Shortness of Breath Shortness of breath (dyspnea) is the feeling of uneasy breathing. Shortness of breath does not always mean that there is a life-threatening illness. However, shortness of breath requires immediate medical care. CAUSES  Causes for shortness of breath include:  Not enough oxygen in the air (as with high altitudes or with a smoke-filled room).   Short-term (acute) lung disease, including:   Infections such as pneumonia.   Fluid in the lungs, such as heart failure.   A blood clot in the lungs (pulmonary embolism).   Lasting (chronic) lung diseases.   Heart disease (heart attack, angina, heart failure, and others).   Low red blood cells (anemia).   Poor physical fitness. This can cause shortness of breath when you exercise.   Chest or back injuries or stiffness.   Being overweight (obese).   Anxiety. This can make you feel like you are not getting enough air.  DIAGNOSIS  Serious medical problems can usually be found during your physical exam. Many tests may also be done to determine why you are having shortness of breath. Tests include:  Chest X-rays.   Lung function tests.   Blood tests.   Electrocardiography.   Exercise testing.   A cardiac echo.   Imaging scans.  Your caregiver may not be able to find a cause for your shortness of breath after your exam. In this case, it is important to have a follow-up exam with your caregiver as directed.  HOME CARE INSTRUCTIONS   Do not smoke. Smoking is a common cause of shortness of breath. Ask for help to stop smoking.   Avoid being around chemicals that may bother your breathing (paint fumes, dust).   Rest as needed. Slowly resume your usual activities.   If medicines were prescribed, take them as directed for the full length of time directed. This includes oxygen and any inhaled medicines.   Follow up with your caregiver as directed. Waiting to do so or failure to follow up could result in worsening of  your condition and possible disability or death.   Be sure you understand what to do or who to call if your shortness of breath worsens.  SEEK MEDICAL CARE IF:   Your condition does not improve in the time expected.   You have a hard time doing your normal activities even with rest.   You have any side effects or problems with the medicines prescribed.   You develop any new symptoms.  SEEK IMMEDIATE MEDICAL CARE IF:   Your shortness of breath is getting worse.   You feel lightheaded, faint, or develop a cough not controlled with medicines.   You start coughing up blood.   You have pain with breathing.   You have chest pain or pain in your arms, shoulders, or abdomen.   You have a fever.   You are unable to walk up stairs or exercise the way you normally do.   Your symptoms are getting worse.  Document Released: 09/22/2000 Document Revised: 12/17/2010 Document Reviewed: 05/10/2007 [employer] Patient Information 2012 [employer]. 

## 2011-07-09 NOTE — ED Notes (Signed)
Pt presents with 1 month h/o shortness of breath.  Pt reports symptoms have been intermittent, pt seen at MD office yesterday for same, had blood work drawn with d-dimer elevated.  Pt referred here.  Pt reports chest "fullness" that has been intermittent, denies any at present.  Pt reports generalized fatigue.

## 2011-07-09 NOTE — ED Provider Notes (Signed)
History     CSN: [id number]  Arrival date & time 07/09/11  1244   First MD Initiated Contact with Patient 07/09/11 1503      Chief Complaint  Patient presents with  . Shortness of Breath    HPI Comments: Pt states she has been fatigued and short of breath.  She mentioned it to her doctor yesterday who did blood tests.  Her d dimer was elevated and she was told to come to the ED.  Patient is a 71 y.o. female presenting with shortness of breath. The history is provided by the patient.  Shortness of Breath  The current episode started more than 2 weeks ago (about month). The onset was gradual. The problem occurs continuously. The problem has been unchanged. The problem is moderate. The symptoms are relieved by rest. The symptoms are aggravated by activity. Associated symptoms include chest pressure and shortness of breath. Pertinent negatives include no chest pain, no stridor, no cough and no wheezing.  Sometimes her chest will feel heavy in her chest when she exerts herself.  She has plans for an echo with her doctor next Tuesday.  Not on estrogen meds.  No prior DVT/PE. Recent car ride to [place].  She did notice leg swelling after that (bilateral) but it got better with diuretics.  Past Medical History  Diagnosis Date  . Hypertension   . Gout   . Type II diabetes mellitus   . Chest pain   . Osteopenia   . Gastric ulcer   . Hyperlipidemia   . Osteoarthritis   . Breast cyst     left breast    Past Surgical History  Procedure Date  . Abdominal hysterectomy   . Abdominal adhesion surgery   . Breast lumpectomy     left  . Cholecystectomy   . Appendectomy   . Anus surgery   . Breast biopsy     Family History  Problem Relation Age of Onset  . Heart disease Mother   . Cancer Sister     ovarian    History  Substance Use Topics  . Smoking status: Never Smoker   . Smokeless tobacco: Not on file  . Alcohol Use: Yes    OB History    Grav Para Term Preterm Abortions  TAB SAB Ect Mult Living                  Review of Systems  Respiratory: Positive for shortness of breath. Negative for cough, wheezing and stridor.   Cardiovascular: Negative for chest pain.  All other systems reviewed and are negative.    Allergies  Codeine  Home Medications   Current Outpatient Rx  Name Route Sig Dispense Refill  . ALLOPURINOL 100 MG PO TABS Oral Take 200 mg by mouth daily.     [name] AMLODIPINE BESYLATE 10 MG PO TABS Oral Take 10 mg by mouth daily.    . ASPIRIN EC 81 MG PO TBEC Oral Take 81 mg by mouth daily.    . COLCHICINE 0.6 MG PO TABS Oral Take 0.6 mg by mouth every other day.     [name] GABAPENTIN 300 MG PO CAPS Oral Take 300 mg by mouth at bedtime.     . GLYBURIDE 5 MG PO TABS Oral Take 5 mg by mouth 2 (two) times daily with a meal.     . HYDROCODONE-ACETAMINOPHEN 5-325 MG PO TABS Oral Take 1 tablet by mouth every 8 (eight) hours as needed. For pain    .  LISINOPRIL 40 MG PO TABS Oral Take 40 mg by mouth daily.      [name] METFORMIN HCL 1000 MG PO TABS Oral Take 1,000 mg by mouth 2 (two) times daily with a meal.      . METOPROLOL TARTRATE 100 MG PO TABS Oral Take 100 mg by mouth daily.     [name] OMEPRAZOLE 20 MG PO CPDR Oral Take 20 mg by mouth daily as needed.     [name] VITAMIN D (ERGOCALCIFEROL) 50000 UNITS PO CAPS Oral Take 50,000 Units by mouth every 7 (seven) days.    [name] PREDNISONE 5 MG PO TABS Oral Take 5 mg by mouth every other day. For swelling      BP 133/68  Pulse 83  Temp 98.5 F (36.9 C) (Oral)  Resp 20  Ht 5\' 2"  (1.575 m)  Wt 193 lb (87.544 kg)  BMI 35.30 kg/m2  SpO2 98%  Physical Exam  Nursing note and vitals reviewed. Constitutional: She appears well-developed and well-nourished. No distress.  HENT:  Head: Normocephalic and atraumatic.  Right Ear: External ear normal.  Left Ear: External ear normal.  Eyes: Conjunctivae are normal. Right eye exhibits no discharge. Left eye exhibits no discharge. No scleral icterus.  Neck: Neck supple. No tracheal  deviation present.  Cardiovascular: Normal rate, regular rhythm and intact distal pulses.   Pulmonary/Chest: Effort normal and breath sounds normal. No stridor. No respiratory distress. She has no wheezes. She has no rales.  Abdominal: Soft. Bowel sounds are normal. She exhibits no distension. There is no tenderness. There is no rebound and no guarding.  Musculoskeletal: She exhibits no edema and no tenderness.       Mild ttp knee, no calf or popliteal ttp  Neurological: She is alert. She has normal strength. No sensory deficit. Cranial nerve deficit:  no gross defecits noted. She exhibits normal muscle tone. She displays no seizure activity. Coordination normal.  Skin: Skin is warm and dry. No rash noted.  Psychiatric: She has a normal mood and affect.    ED Course  Procedures (including critical care time)  Rate: 86  Rhythm: normal sinus rhythm  QRS Axis: normal  Intervals: normal  ST/T Wave abnormalities: normal  Conduction Disutrbances:none  Narrative Interpretation: nl  Old EKG Reviewed: none available  Labs Reviewed  BASIC METABOLIC PANEL - Abnormal; Notable for the following:    GFR calc non Af Amer 58 (*)     GFR calc Af Amer 68 (*)     All other components within normal limits  PRO B NATRIURETIC PEPTIDE - Abnormal; Notable for the following:    Pro B Natriuretic peptide (BNP) 250.6 (*)     All other components within normal limits  D-DIMER, QUANTITATIVE - Abnormal; Notable for the following:    D-Dimer, Quant 1.31 (*)     All other components within normal limits  CBC WITH DIFFERENTIAL  POCT I-STAT TROPONIN I   Dg Chest 2 View  07/09/2011  *RADIOLOGY REPORT*  Clinical Data: Shortness of breath  CHEST - 2 VIEW  Comparison: 12/19/2008  Findings: Surgical clips are present in the left axilla.  Heart size and mediastinal contours appear normal.  No pleural effusion or edema identified.  Mild multilevel spondylosis identified within the thoracic spine.  IMPRESSION:  1.  No  acute cardiopulmonary abnormalities.  Original Report Authenticated By: Rosealee Albee, M.D.   Ct Angio Chest W/cm &/or Wo Cm  07/09/2011  *RADIOLOGY REPORT*  Clinical Data: Shortness of breath.  Elevated  D-dimer.  History of lumpectomy.  CT ANGIOGRAPHY CHEST  Technique:  Multidetector CT imaging of the chest using the standard protocol during bolus administration of intravenous contrast. Multiplanar reconstructed images including MIPs were obtained and reviewed to evaluate the vascular anatomy.  Contrast: 62mL OMNIPAQUE IOHEXOL 350 MG/ML SOLN  Comparison: Plain films 07/09/2011.  No prior CT.  Findings: Lung windows demonstrate mild degradation by motion and patient body habitus.  Mild volume loss in the inferior right middle lobe and dependent lung bases bilaterally.  Soft tissue windows:  The quality of this exam for evaluation of pulmonary embolism is moderate.  The primary limitations are detailed above.  The bolus is well timed. No evidence of pulmonary embolism to the large segmental level.  Tortuous descending thoracic aorta.  Moderate cardiomegaly, without pericardial or pleural effusion.  No mediastinal or hilar adenopathy.  A small hiatal hernia.  Limited abdominal imaging demonstrates too small to characterize left hepatic lobe lesion on image 67.  Cholecystectomy.  Surgical clips in the left axilla or lateral left breast. No acute osseous abnormality.  IMPRESSION: 1.  Degraded exam secondary to patient body habitus and respiratory motion.  No pulmonary embolism to the large segmental level. 2.  Cardiomegaly without explanation for shortness of breath. 3.  Small hiatal hernia. 4.  Too small to characterize lateral segment left liver lobe lesion.  Most likely small cyst.  Original Report Authenticated By: Consuello Bossier, M.D.     1. Shortness of breath   2. Elevated d-dimer       MDM  Patient's symptoms have been ongoing for over a month, I doubt unstable angina.. She was sent in  specifically to the emergency room to evaluate for pulmonary embolism by her cardiologist.. The CT scan although somewhat limited does not show any evidence of significant pulmonary embolism. At this point do not feel that she needs any further workup regarding this etiology. Cardiomegaly was noted on the CT scan however the patient does have plans for followup ultrasound on Tuesday. She would prefer to go home. At this point he do not feel there is any evidence to suggest an acute emergency medical condition. I did explain to her that if she had any worsening symptoms over the weekend to not hesitate to come back to the emergency room.        Celene Kras, MD 07/09/11 [phone]

## 2011-08-31 ENCOUNTER — Other Ambulatory Visit: Payer: Self-pay | Admitting: Pain Medicine

## 2011-08-31 DIAGNOSIS — M25569 Pain in unspecified knee: Secondary | ICD-10-CM

## 2011-09-07 ENCOUNTER — Ambulatory Visit
Admission: RE | Admit: 2011-09-07 | Discharge: 2011-09-07 | Disposition: A | Discharge status: Home / Self Care | Payer: PRIVATE HEALTH INSURANCE | Source: Ambulatory Visit | Attending: Pain Medicine | Admitting: Pain Medicine

## 2011-09-07 DIAGNOSIS — M25569 Pain in unspecified knee: Secondary | ICD-10-CM

## 2011-10-04 ENCOUNTER — Emergency Department (HOSPITAL_COMMUNITY): Payer: PRIVATE HEALTH INSURANCE

## 2011-10-04 ENCOUNTER — Encounter (HOSPITAL_COMMUNITY): Payer: Self-pay | Admitting: *Deleted

## 2011-10-04 ENCOUNTER — Emergency Department (HOSPITAL_COMMUNITY)
Admission: EM | Admit: 2011-10-04 | Discharge: 2011-10-04 | Disposition: A | Discharge status: Home / Self Care | Payer: PRIVATE HEALTH INSURANCE | Attending: Emergency Medicine | Admitting: Emergency Medicine

## 2011-10-04 DIAGNOSIS — S82143A Displaced bicondylar fracture of unspecified tibia, initial encounter for closed fracture: Secondary | ICD-10-CM

## 2011-10-04 DIAGNOSIS — I10 Essential (primary) hypertension: Secondary | ICD-10-CM | POA: Insufficient documentation

## 2011-10-04 DIAGNOSIS — W010XXA Fall on same level from slipping, tripping and stumbling without subsequent striking against object, initial encounter: Secondary | ICD-10-CM | POA: Insufficient documentation

## 2011-10-04 DIAGNOSIS — E119 Type 2 diabetes mellitus without complications: Secondary | ICD-10-CM | POA: Insufficient documentation

## 2011-10-04 DIAGNOSIS — Z8041 Family history of malignant neoplasm of ovary: Secondary | ICD-10-CM | POA: Insufficient documentation

## 2011-10-04 DIAGNOSIS — S82109A Unspecified fracture of upper end of unspecified tibia, initial encounter for closed fracture: Secondary | ICD-10-CM | POA: Insufficient documentation

## 2011-10-04 DIAGNOSIS — Z8249 Family history of ischemic heart disease and other diseases of the circulatory system: Secondary | ICD-10-CM | POA: Insufficient documentation

## 2011-10-04 DIAGNOSIS — Z885 Allergy status to narcotic agent status: Secondary | ICD-10-CM | POA: Insufficient documentation

## 2011-10-04 MED ORDER — OXYCODONE-ACETAMINOPHEN 5-325 MG PO TABS
2.0000 | ORAL_TABLET | Freq: Once | ORAL | Status: AC
  Administered 2011-10-04: 2 via ORAL
  Filled 2011-10-04: qty 2

## 2011-10-04 MED ORDER — OXYCODONE-ACETAMINOPHEN 5-325 MG PO TABS: 1.0000 | ORAL_TABLET | Freq: Four times a day (QID) | ORAL | Status: DC | PRN

## 2011-10-04 NOTE — ED Provider Notes (Signed)
History  This chart was scribed for Ronee Ranganathan B. Bernette Mayers, MD by Shari Heritage. The patient was seen in room TR08C/TR08C. Patient's care was started at 0947.     CSN: [id number]  Arrival date & time 10/04/11  7829   First MD Initiated Contact with Patient 10/04/11 [phone]      Chief Complaint  Patient presents with  . Knee Injury    The history is provided by the patient. No language interpreter was used.    Nancy [name] is a 71 y.o. female who presents to the Emergency Department complaining of moderate to severe, constant and worsening right knee pain resulting from a fall in the bathtub that occurred 2 days ago. Patient states that she twisted it when she fell. Patient states that she has had trouble walking since the incident. Patient sees Dr. Jordan Likes for pain management. She has a history of back pain and left knee pain. She also takes Prednisone as needed for treatment of gout flare-ups.  Past Medical History  Diagnosis Date  . Hypertension   . Gout   . Type II diabetes mellitus   . Chest pain   . Osteopenia   . Gastric ulcer   . Hyperlipidemia   . Osteoarthritis   . Breast cyst     left breast    Past Surgical History  Procedure Date  . Abdominal hysterectomy   . Abdominal adhesion surgery   . Breast lumpectomy     left  . Cholecystectomy   . Appendectomy   . Anus surgery   . Breast biopsy     Family History  Problem Relation Age of Onset  . Heart disease Mother   . Cancer Sister     ovarian    History  Substance Use Topics  . Smoking status: Never Smoker   . Smokeless tobacco: Not on file  . Alcohol Use: Yes    OB History    Grav Para Term Preterm Abortions TAB SAB Ect Mult Living                  Review of Systems A complete 10 system review of systems was obtained and all systems are negative except as noted in the HPI and PMH.   Allergies  Codeine  Home Medications   Current Outpatient Rx  Name Route Sig Dispense Refill  .  ALLOPURINOL 100 MG PO TABS Oral Take 200 mg by mouth daily.     [name] AMLODIPINE BESYLATE 10 MG PO TABS Oral Take 10 mg by mouth daily.    . ASPIRIN EC 81 MG PO TBEC Oral Take 81 mg by mouth daily.    [name] CALCIUM + D PO Oral Take 1 tablet by mouth daily.    . COLCHICINE 0.6 MG PO TABS Oral Take 0.6 mg by mouth every other day.     [name] GABAPENTIN 300 MG PO CAPS Oral Take 300 mg by mouth at bedtime.     . GLYBURIDE 5 MG PO TABS Oral Take 5 mg by mouth 2 (two) times daily with a meal.     . HYDROCODONE-ACETAMINOPHEN 5-325 MG PO TABS Oral Take 1 tablet by mouth every 8 (eight) hours as needed. For pain    . LISINOPRIL 40 MG PO TABS Oral Take 40 mg by mouth daily.      [name] METFORMIN HCL 1000 MG PO TABS Oral Take 1,000 mg by mouth 2 (two) times daily with a meal.      . METOPROLOL TARTRATE  100 MG PO TABS Oral Take 100 mg by mouth daily.     [name] PREDNISONE 5 MG PO TABS Oral Take 5 mg by mouth every other day. For swelling    . VITAMIN D (ERGOCALCIFEROL) 50000 UNITS PO CAPS Oral Take 50,000 Units by mouth every 7 (seven) days.    . OMEPRAZOLE 20 MG PO CPDR Oral Take 20 mg by mouth daily as needed.       BP 157/63  Pulse 110  Temp 98.6 F (37 C) (Oral)  Resp 20  SpO2 96%  Physical Exam  Nursing note and vitals reviewed. Constitutional: She is oriented to person, place, and time. She appears well-developed and well-nourished.  HENT:  Head: Normocephalic and atraumatic.  Eyes: EOM are normal. Pupils are equal, round, and reactive to light.  Neck: Normal range of motion. Neck supple.  Cardiovascular: Normal rate, normal heart sounds and intact distal pulses.   Pulmonary/Chest: Effort normal and breath sounds normal.  Abdominal: Bowel sounds are normal. She exhibits no distension. There is no tenderness.  Musculoskeletal: Normal range of motion. She exhibits no edema and no tenderness.       Right knee: She exhibits swelling. tenderness found.       Right foot: She exhibits swelling.       Swelling and  tenderness to the right knee. Mild swelling to the right foot.  Neurological: She is alert and oriented to person, place, and time. She has normal strength. No cranial nerve deficit or sensory deficit.  Skin: Skin is warm and dry. No rash noted.  Psychiatric: She has a normal mood and affect.    ED Course  Procedures (including critical care time) DIAGNOSTIC STUDIES: Oxygen Saturation is 96% on room air, adequate by my interpretation.    COORDINATION OF CARE: 9:58am- Patient informed of current plan for treatment and evaluation and agrees with plan at this time. Will order CT and X-ray of right knee.   Labs Reviewed - No data to display  Ct Knee Right Wo Contrast  10/04/2011  *RADIOLOGY REPORT*  Clinical Data: Knee pain with inability to bear weight status post fall 2 days ago.  Lateral tibial plateau fracture on radiographs.  CT OF THE RIGHT KNEE WITHOUT CONTRAST  Technique:  Multidetector CT imaging was performed according to the standard protocol. Multiplanar CT image reconstructions were also generated.  Comparison: Radiographs same date.  Findings: Intra-articular fracture involving the lateral tibial plateau posteriorly demonstrates up to 4 mm of depression of the articular surface.  The fracture does not extend into the proximal tibiofibular joint.  No other fractures are identified.  There are tricompartmental degenerative changes with osteophytes and meniscal chondrocalcinosis.  There is a moderate sized hemarthrosis without layering intra- articular fat.  There is a small to moderate Baker's cyst which contains small calcified loose bodies.  IMPRESSION:  1.  Mildly depressed intra-articular fracture of the lateral tibial plateau posteriorly. 2.  Associated hemarthrosis without layering intra-articular fat. 3.  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   Original Report Authenticated By: Gerrianne Scale, M.D.    Dg  Knee Complete 4 Views Right  10/04/2011  *RADIOLOGY REPORT*  Clinical Data: Pain post fall, unable to bear weight  RIGHT KNEE - COMPLETE 4+ VIEW  Comparison: None  Findings: Osseous demineralization. Joint spaces preserved. Chondrocalcinosis. Mildly depressed fracture identified at lateral margin of lateral tibial plateau. No additional fracture, dislocation, or bone destruction. Minimal patellofemoral degenerative changes  and spurring. Large knee joint effusion.  IMPRESSION: Mildly depressed fracture at lateral margin of lateral tibial plateau with associated knee joint effusion. Osseous demineralization. Chondrocalcinosis question CPPD/pseudogout.   Original Report Authenticated By: Lollie Marrow, M.D.      No diagnosis found.    MDM  Discussed with Dr. August Saucer who requests CT of the knee, immobilizer, crutches, non-weight bearing and follow-up in the office this afternoon. Pt feeling better with pain medications.       I personally performed the services described in the documentation, which were scribed in my presence. The recorded information has been reviewed and considered.     Kani Chauvin B. Bernette Mayers, MD 10/04/11 1218

## 2011-10-04 NOTE — ED Notes (Signed)
Pt instructed not to bear weight on right leg, dc in wheelchair

## 2011-10-04 NOTE — Progress Notes (Signed)
Orthopedic Tech Progress Note Patient Details:  Nancy [name] 12/05/1940 [id number] Knee Immobilizer delivered to patient in [employer]. Patient going out for CT scan. Nurse to put patient in Knee Immobilizer upon return.  Ortho Devices Type of Ortho Device: Knee Immobilizer Ortho Device/Splint Location: Right LE Ortho Device/Splint Interventions: Ordered   [place] R Thompson 10/04/2011, 11:12 AM

## 2011-10-04 NOTE — ED Notes (Signed)
To ED for eval of right knee pain and swelling since falling in tube on Saturday. States she has been able to walk 'until now'. Noted swelling to right leg.

## 2011-12-21 ENCOUNTER — Other Ambulatory Visit: Payer: Self-pay | Admitting: Family Medicine

## 2011-12-21 DIAGNOSIS — Z1231 Encounter for screening mammogram for malignant neoplasm of breast: Secondary | ICD-10-CM

## 2011-12-21 DIAGNOSIS — Z780 Asymptomatic menopausal state: Secondary | ICD-10-CM

## 2011-12-21 DIAGNOSIS — IMO0002 Reserved for concepts with insufficient information to code with codable children: Secondary | ICD-10-CM

## 2012-01-24 ENCOUNTER — Other Ambulatory Visit: Payer: PRIVATE HEALTH INSURANCE

## 2012-01-24 ENCOUNTER — Ambulatory Visit: Payer: PRIVATE HEALTH INSURANCE

## 2012-02-16 ENCOUNTER — Ambulatory Visit
Admission: RE | Admit: 2012-02-16 | Discharge: 2012-02-16 | Disposition: A | Discharge status: Home / Self Care | Payer: PRIVATE HEALTH INSURANCE | Source: Ambulatory Visit | Attending: Family Medicine | Admitting: Family Medicine

## 2012-02-16 DIAGNOSIS — Z780 Asymptomatic menopausal state: Secondary | ICD-10-CM

## 2012-02-16 DIAGNOSIS — Z1231 Encounter for screening mammogram for malignant neoplasm of breast: Secondary | ICD-10-CM

## 2012-02-16 DIAGNOSIS — IMO0002 Reserved for concepts with insufficient information to code with codable children: Secondary | ICD-10-CM

## 2012-11-27 ENCOUNTER — Encounter: Payer: Self-pay | Admitting: Podiatry

## 2012-11-27 ENCOUNTER — Ambulatory Visit (INDEPENDENT_AMBULATORY_CARE_PROVIDER_SITE_OTHER): Payer: Medicare Other | Admitting: Podiatry

## 2012-11-27 ENCOUNTER — Ambulatory Visit (INDEPENDENT_AMBULATORY_CARE_PROVIDER_SITE_OTHER): Payer: Medicare Other

## 2012-11-27 VITALS — BP 155/76 | HR 69 | Resp 18

## 2012-11-27 DIAGNOSIS — S93609A Unspecified sprain of unspecified foot, initial encounter: Secondary | ICD-10-CM

## 2012-11-27 DIAGNOSIS — R52 Pain, unspecified: Secondary | ICD-10-CM

## 2012-11-27 NOTE — Patient Instructions (Signed)
Wear her lace up athletic style shoe on the right foot until all pain stops. Return in the next several weeks for debridement of mycotic toenails.

## 2012-11-27 NOTE — Progress Notes (Signed)
°  Subjective:    Patient ID: Nancy [name], female    DOB: 03-28-1940, 72 y.o.   MRN: [id number]  HPI i fell down some steps one month ago and sharp pain and swelling and it was on my right foot and bruised real bad and hurts around big toe and color finally came back. Also is complaining of some pain in her hallux toenails.    Review of Systems  Constitutional: Positive for fatigue.  Eyes: Positive for pain.  Respiratory: Negative.   Cardiovascular: Negative.   Gastrointestinal: Negative.   Endocrine: Negative.   Genitourinary: Negative.   Musculoskeletal: Positive for back pain.  Skin: Negative.   Allergic/Immunologic: Negative.   Neurological: Positive for headaches.  Hematological: Negative.   Psychiatric/Behavioral: Negative.        Objective:   Physical Exam  Orientated x19 72-year-old black female.   Vascular: The DP and PT pulses are two over four bilaterally. Capillary fill is immediate bilaterally. Feet are warm to touch bilaterally.  Neurological: Sensation detained in monofilament wire intact 10 over 10 locations bilaterally. Vibratory sensation intact bilaterally.  Dermatological: Mild hypertrophy and incurvation of toenails x10 including the hallux toenails.  Musculoskeletal: Patient is limping gait because of knee pain. She is palpable tenderness over the second and third metatarsal. On her right foot without a palpable lesions. X-ray report see attached report demonstrates no fractures or dislocations noted.      Assessment & Plan:   Assessment: Sprain right foot Incurvated hallux nails bilaterally Satisfactory neurovascular status  Plan: Patient advised that she has no fracture dislocation in the right foot. Advised her to reduce her activity and wear a lace up athletic style shoe until [place] comfortable. Reappoint at her convenience for evaluation of ingrowing hallux toenails.

## 2012-12-11 ENCOUNTER — Ambulatory Visit: Payer: Medicare Other | Admitting: Podiatry

## 2012-12-11 ENCOUNTER — Other Ambulatory Visit: Payer: Self-pay | Admitting: Gastroenterology

## 2012-12-14 ENCOUNTER — Ambulatory Visit
Admission: RE | Admit: 2012-12-14 | Discharge: 2012-12-14 | Disposition: A | Discharge status: Home / Self Care | Payer: PRIVATE HEALTH INSURANCE | Source: Ambulatory Visit | Attending: Gastroenterology | Admitting: Gastroenterology

## 2012-12-14 MED ORDER — IOHEXOL 300 MG/ML  SOLN
100.0000 mL | Freq: Once | INTRAMUSCULAR | Status: AC | PRN
  Administered 2012-12-14: 100 mL via INTRAVENOUS

## 2012-12-22 ENCOUNTER — Emergency Department (HOSPITAL_COMMUNITY): Payer: PRIVATE HEALTH INSURANCE

## 2012-12-22 ENCOUNTER — Encounter (HOSPITAL_COMMUNITY): Payer: Self-pay | Admitting: Emergency Medicine

## 2012-12-22 DIAGNOSIS — M899 Disorder of bone, unspecified: Secondary | ICD-10-CM | POA: Insufficient documentation

## 2012-12-22 DIAGNOSIS — M542 Cervicalgia: Secondary | ICD-10-CM | POA: Insufficient documentation

## 2012-12-22 DIAGNOSIS — E785 Hyperlipidemia, unspecified: Secondary | ICD-10-CM | POA: Insufficient documentation

## 2012-12-22 DIAGNOSIS — Z8719 Personal history of other diseases of the digestive system: Secondary | ICD-10-CM | POA: Insufficient documentation

## 2012-12-22 DIAGNOSIS — Z7982 Long term (current) use of aspirin: Secondary | ICD-10-CM | POA: Insufficient documentation

## 2012-12-22 DIAGNOSIS — Z79899 Other long term (current) drug therapy: Secondary | ICD-10-CM | POA: Insufficient documentation

## 2012-12-22 DIAGNOSIS — M109 Gout, unspecified: Secondary | ICD-10-CM | POA: Insufficient documentation

## 2012-12-22 DIAGNOSIS — M545 Low back pain, unspecified: Secondary | ICD-10-CM | POA: Insufficient documentation

## 2012-12-22 DIAGNOSIS — M1710 Unilateral primary osteoarthritis, unspecified knee: Secondary | ICD-10-CM | POA: Insufficient documentation

## 2012-12-22 DIAGNOSIS — I10 Essential (primary) hypertension: Secondary | ICD-10-CM | POA: Insufficient documentation

## 2012-12-22 DIAGNOSIS — E119 Type 2 diabetes mellitus without complications: Secondary | ICD-10-CM | POA: Insufficient documentation

## 2012-12-22 DIAGNOSIS — Z885 Allergy status to narcotic agent status: Secondary | ICD-10-CM | POA: Insufficient documentation

## 2012-12-22 DIAGNOSIS — G8929 Other chronic pain: Secondary | ICD-10-CM | POA: Insufficient documentation

## 2012-12-22 NOTE — ED Notes (Signed)
Pt. reports right side of neck pain onset Wednesday morning when she woke up , denies injury . No fever or neck stiffness. Pain worse with movement and certain positions .

## 2012-12-23 ENCOUNTER — Encounter (HOSPITAL_COMMUNITY): Payer: Self-pay | Admitting: Emergency Medicine

## 2012-12-23 ENCOUNTER — Emergency Department (HOSPITAL_COMMUNITY)
Admission: EM | Admit: 2012-12-23 | Discharge: 2012-12-23 | Disposition: A | Discharge status: Home / Self Care | Payer: PRIVATE HEALTH INSURANCE | Attending: Emergency Medicine | Admitting: Emergency Medicine

## 2012-12-23 DIAGNOSIS — M542 Cervicalgia: Secondary | ICD-10-CM

## 2012-12-23 MED ORDER — FENTANYL CITRATE 0.05 MG/ML IJ SOLN
50.0000 ug | INTRAMUSCULAR | Status: DC | PRN
  Administered 2012-12-23 (×3): 50 ug via INTRAVENOUS
  Filled 2012-12-23 (×2): qty 2

## 2012-12-23 MED ORDER — HYDROMORPHONE HCL PF 1 MG/ML IJ SOLN
1.0000 mg | Freq: Once | INTRAMUSCULAR | Status: AC
  Administered 2012-12-23: 1 mg via INTRAVENOUS
  Filled 2012-12-23: qty 1

## 2012-12-23 MED ORDER — CYCLOBENZAPRINE HCL 10 MG PO TABS: 10.0000 mg | ORAL_TABLET | Freq: Two times a day (BID) | ORAL | Status: DC | PRN

## 2012-12-23 MED ORDER — DIAZEPAM 5 MG PO TABS
5.0000 mg | ORAL_TABLET | Freq: Once | ORAL | Status: AC
  Administered 2012-12-23: 5 mg via ORAL
  Filled 2012-12-23: qty 1

## 2012-12-23 MED ORDER — OXYCODONE-ACETAMINOPHEN 5-325 MG PO TABS: 1.0000 | ORAL_TABLET | Freq: Four times a day (QID) | ORAL | Status: DC | PRN

## 2012-12-23 MED ORDER — ONDANSETRON HCL 4 MG/2ML IJ SOLN
4.0000 mg | Freq: Once | INTRAMUSCULAR | Status: AC
  Administered 2012-12-23: 4 mg via INTRAVENOUS
  Filled 2012-12-23: qty 2

## 2012-12-23 NOTE — ED Provider Notes (Signed)
CSN: [id number]     Arrival date & time 12/22/12  2143 History   First MD Initiated Contact with Patient 12/23/12 0014     Chief Complaint  Patient presents with  . Neck Pain   (Consider location/radiation/quality/duration/timing/severity/associated sxs/prior Treatment) HPI History provided by patient. Has chronic low back pain and has been recommended to have surgery by her orthopedic surgeon Dr. Shon Baton. She has been putting this off until she can have her left knee addressed. She has arthritis in her knee and pain with walking unchanged. Most recently he has been sleeping in a recliner due to her back pain. 4 days ago woke up with right-sided neck pain, hurts to move it. Pain moderate to severe. She took one hydrocodone a few days ago but has not taken anything since then. She presents with more severe pain tonight unable to move her neck without excruciating pain. No associated weakness or numbness. She denies any fall or direct trauma. No history of neck problems in the past. Her back pain and knee pain are unchanged.  Past Medical History  Diagnosis Date  . Hypertension   . Gout   . Type II diabetes mellitus   . Chest pain   . Osteopenia   . Gastric ulcer   . Hyperlipidemia   . Osteoarthritis   . Breast cyst     left breast   Past Surgical History  Procedure Laterality Date  . Abdominal hysterectomy    . Abdominal adhesion surgery    . Breast lumpectomy      left  . Cholecystectomy    . Appendectomy    . Anus surgery    . Breast biopsy     Family History  Problem Relation Age of Onset  . Heart disease Mother   . Cancer Sister     ovarian   History  Substance Use Topics  . Smoking status: Never Smoker   . Smokeless tobacco: Not on file  . Alcohol Use: Yes   OB History   Grav Para Term Preterm Abortions TAB SAB Ect Mult Living                 Review of Systems  Constitutional: Negative for fever and chills.  Eyes: Negative for visual disturbance.   Respiratory: Negative for shortness of breath.   Cardiovascular: Negative for chest pain.  Gastrointestinal: Negative for abdominal pain.  Genitourinary: Negative for flank pain.  Musculoskeletal: Positive for back pain, neck pain and neck stiffness.  Skin: Negative for rash.  Neurological: Negative for headaches.  All other systems reviewed and are negative.    Allergies  Codeine  Home Medications   Current Outpatient Rx  Name  Route  Sig  Dispense  Refill  . allopurinol (ZYLOPRIM) 100 MG tablet   Oral   Take 200 mg by mouth daily.          [name] amLODipine (NORVASC) 10 MG tablet   Oral   Take 10 mg by mouth daily.         [name] aspirin EC 81 MG tablet   Oral   Take 81 mg by mouth daily.         . colchicine (COLCRYS) 0.6 MG tablet   Oral   Take 0.6 mg by mouth every other day.          . gabapentin (NEURONTIN) 300 MG capsule   Oral   Take 300 mg by mouth at bedtime.          [name]  glyBURIDE (DIABETA) 5 MG tablet   Oral   Take 5 mg by mouth 2 (two) times daily with a meal.          . HYDROcodone-acetaminophen (NORCO) 5-325 MG per tablet   Oral   Take 1 tablet by mouth every 8 (eight) hours as needed. For pain         . lisinopril (PRINIVIL,ZESTRIL) 40 MG tablet   Oral   Take 40 mg by mouth daily.           . metFORMIN (GLUCOPHAGE) 1000 MG tablet   Oral   Take 1,000 mg by mouth 2 (two) times daily with a meal.           . metoprolol (LOPRESSOR) 100 MG tablet   Oral   Take 100 mg by mouth daily.          [name] omeprazole (PRILOSEC) 20 MG capsule   Oral   Take 20 mg by mouth daily as needed (acid reflux).          . predniSONE (DELTASONE) 5 MG tablet   Oral   Take 5 mg by mouth every other day. For swelling         . Vitamin D, Ergocalciferol, (DRISDOL) 50000 UNITS CAPS   Oral   Take 50,000 Units by mouth every 7 (seven) days.          BP 175/82  Pulse 92  Temp(Src) 98.6 F (37 C) (Oral)  Resp 20  SpO2 100% Physical Exam   Constitutional: She is oriented to person, place, and time. She appears well-developed and well-nourished.  HENT:  Head: Normocephalic and atraumatic.  Eyes: EOM are normal. Pupils are equal, round, and reactive to light.  Neck:  Some midline cervical tenderness with significant right paracervical tenderness and muscle spasm. She has equal grips, biceps, triceps with distal sensorium to light touch equal and intact to upper extremities  Cardiovascular: Normal rate, regular rhythm and intact distal pulses.   Pulmonary/Chest: Effort normal and breath sounds normal. No respiratory distress.  Abdominal: Soft. She exhibits no distension.  Musculoskeletal: Normal range of motion. She exhibits no edema.  No significant tenderness over thoracic or lumbar spine otherwise. Gait intact  Neurological: She is alert and oriented to person, place, and time.  Skin: Skin is warm and dry.    ED Course  Procedures (including critical care time) Labs Review Labs Reviewed - No data to display Imaging Review Dg Cervical Spine Complete  12/22/2012   CLINICAL DATA:  Awoke Wednesday with pain in the back of the neck. Gradual worsening.  EXAM: CERVICAL SPINE  4+ VIEWS  COMPARISON:  None.  FINDINGS: There is no evidence of cervical spine fracture or prevertebral soft tissue swelling. Alignment is normal. No other significant bone abnormalities are identified. Osseous demineralization. Disc space narrowing C5-6 and C6-7. Foraminal narrowing most notable at C5-6 bilaterally. Clear lung apices.  IMPRESSION: Spondylosis as described.  No acute findings.   Electronically Signed   By: Davonna Belling M.D.   On: 12/22/2012 23:42    IV fentanyl and on recheck no change in her pain. Valium provided. IV Dilaudid with pain decreased to 3/10  Plan discharge home with prescription for oxycodone. Patient agrees to take this as prescribed and followup with Dr. Shon Baton in the clinic. She agrees to strict return precautions.   MDM   Dx: Neck pain, cervical strain  X-ray reviewed as above no bony fractures, spondylosis as above IV narcotics pain control. Vital  signs and nursing notes reviewed and considered    Sunnie Nielsen, MD 12/23/12 [phone]

## 2012-12-25 ENCOUNTER — Ambulatory Visit: Payer: Medicare Other | Admitting: Podiatry

## 2013-01-03 ENCOUNTER — Encounter: Payer: Self-pay | Admitting: General Surgery

## 2013-01-03 DIAGNOSIS — I10 Essential (primary) hypertension: Secondary | ICD-10-CM | POA: Insufficient documentation

## 2013-01-17 ENCOUNTER — Encounter: Payer: Self-pay | Admitting: General Surgery

## 2013-01-17 ENCOUNTER — Ambulatory Visit (INDEPENDENT_AMBULATORY_CARE_PROVIDER_SITE_OTHER): Payer: PRIVATE HEALTH INSURANCE | Admitting: Cardiology

## 2013-01-17 ENCOUNTER — Encounter: Payer: Self-pay | Admitting: Cardiology

## 2013-01-17 VITALS — BP 140/80 | HR 80 | Ht 62.0 in | Wt 173.4 lb

## 2013-01-17 DIAGNOSIS — I519 Heart disease, unspecified: Secondary | ICD-10-CM

## 2013-01-17 DIAGNOSIS — I5032 Chronic diastolic (congestive) heart failure: Secondary | ICD-10-CM | POA: Insufficient documentation

## 2013-01-17 DIAGNOSIS — I5189 Other ill-defined heart diseases: Secondary | ICD-10-CM

## 2013-01-17 DIAGNOSIS — I10 Essential (primary) hypertension: Secondary | ICD-10-CM

## 2013-01-17 DIAGNOSIS — R0989 Other specified symptoms and signs involving the circulatory and respiratory systems: Secondary | ICD-10-CM

## 2013-01-17 NOTE — Progress Notes (Signed)
[place], [place]  [address] Phone: [phone] Fax:  [phone]  Date:  01/17/2013   ID:  Nancy [name], DOB January 03, 1941, MRN [id number]  PCP:  Antony Blackbird, MD  Cardiologist:  Fransico Him, MD     History of Present Illness: Nancy [name] is a 73 y.o. female with a history of HTN and diastolic dysfunction who presents today for followup.  She is doing well.  She denies any chest pain, SOB, DOE ,LE edema, palpitations or syncope.     Wt Readings from Last 3 Encounters:  01/17/13 173 lb 6.4 oz (78.654 kg)  07/09/11 193 lb (87.544 kg)  12/29/10 190 lb (86.183 kg)     Past Medical History  Diagnosis Date  . Gout   . Type II diabetes mellitus   . Chest pain   . Osteopenia   . Gastric ulcer   . Hyperlipidemia   . Osteoarthritis   . Breast cyst     left breast  . Hypertension   . Diastolic dysfunction     with mild pulmonary HTN by echo 07/2011    Current Outpatient Prescriptions  Medication Sig Dispense Refill  . allopurinol (ZYLOPRIM) 100 MG tablet Take 200 mg by mouth daily.       [name] amLODipine (NORVASC) 10 MG tablet Take 10 mg by mouth daily.      [name] aspirin EC 81 MG tablet Take 81 mg by mouth daily.      . colchicine (COLCRYS) 0.6 MG tablet Take 0.6 mg by mouth every other day.       . cyclobenzaprine (FLEXERIL) 10 MG tablet Take 1 tablet (10 mg total) by mouth 2 (two) times daily as needed for muscle spasms.  20 tablet  0  . gabapentin (NEURONTIN) 300 MG capsule Take 300 mg by mouth at bedtime.       [name] lisinopril (PRINIVIL,ZESTRIL) 40 MG tablet Take 40 mg by mouth daily.        . metFORMIN (GLUCOPHAGE) 1000 MG tablet Take 1,000 mg by mouth 2 (two) times daily with a meal.        . metoprolol (LOPRESSOR) 100 MG tablet Take 100 mg by mouth daily.       [name] omeprazole (PRILOSEC) 20 MG capsule Take 20 mg by mouth daily as needed (acid reflux).       . predniSONE (DELTASONE) 5 MG tablet Take 5 mg by mouth every other day. For swelling        . Vitamin D, Ergocalciferol, (DRISDOL) 50000 UNITS CAPS Take 50,000 Units by mouth every 7 (seven) days.       No current facility-administered medications for this visit.    Allergies:    Allergies  Allergen Reactions  . Codeine Other (See Comments)    headache    Social History:  The patient  reports that she has never smoked. She does not have any smokeless tobacco history on file. She reports that she drinks alcohol. She reports that she does not use illicit drugs.   Family History:  The patient's family history includes Cancer in her sister; Heart disease in her mother.   ROS:  Please see the history of present illness.      All other systems reviewed and negative.   PHYSICAL EXAM: VS:  BP 140/80  Pulse 80  Ht 5\' 2"  (1.575 m)  Wt 173 lb 6.4 oz (78.654 kg)  BMI 31.71 kg/m2 Well nourished, well developed, in no acute  distress HEENT: normal Neck: no JVD Cardiac:  normal S1, S2; RRR; no murmur Lungs:  clear to auscultation bilaterally, no wheezing, rhonchi or rales Abd: soft, nontender, no hepatomegaly Ext: no edema Skin: warm and dry Neuro:  CNs 2-12 intact, no focal abnormalities noted       ASSESSMENT AND PLAN:  1. Diastolic Dysfunction 2. HTN - well controlled  - continue amlodipine/Lisinopril/metoprolol 3.   Mild pulmonary HTN  - repeat echo to reassess  Followup with me in 1 year  Signed, Fransico Him, MD 01/17/2013 10:33 AM

## 2013-01-17 NOTE — Patient Instructions (Signed)
Your physician recommends that you continue on your current medications as directed. Please refer to the Current Medication list given to you today.  Your physician has requested that you have an echocardiogram. Echocardiography is a painless test that uses sound waves to create images of your heart. It provides your doctor with information about the size and shape of your heart and how well your heart's chambers and valves are working. This procedure takes approximately one hour. There are no restrictions for this procedure.  Your physician wants you to follow-up in: 12 Months with Dr Mallie Snooks will receive a reminder letter in the mail two months in advance. If you don't receive a letter, please call our office to schedule the follow-up appointment.

## 2013-02-01 ENCOUNTER — Encounter: Payer: Self-pay | Admitting: Cardiovascular Disease

## 2013-02-01 ENCOUNTER — Ambulatory Visit (HOSPITAL_COMMUNITY): Payer: PRIVATE HEALTH INSURANCE | Attending: Cardiology | Admitting: Radiology

## 2013-02-01 DIAGNOSIS — E785 Hyperlipidemia, unspecified: Secondary | ICD-10-CM | POA: Insufficient documentation

## 2013-02-01 DIAGNOSIS — I2789 Other specified pulmonary heart diseases: Secondary | ICD-10-CM | POA: Insufficient documentation

## 2013-02-01 DIAGNOSIS — Z6831 Body mass index (BMI) 31.0-31.9, adult: Secondary | ICD-10-CM | POA: Insufficient documentation

## 2013-02-01 DIAGNOSIS — E669 Obesity, unspecified: Secondary | ICD-10-CM | POA: Insufficient documentation

## 2013-02-01 DIAGNOSIS — R072 Precordial pain: Secondary | ICD-10-CM

## 2013-02-01 DIAGNOSIS — R0989 Other specified symptoms and signs involving the circulatory and respiratory systems: Secondary | ICD-10-CM | POA: Insufficient documentation

## 2013-02-01 DIAGNOSIS — R079 Chest pain, unspecified: Secondary | ICD-10-CM | POA: Insufficient documentation

## 2013-02-01 DIAGNOSIS — I10 Essential (primary) hypertension: Secondary | ICD-10-CM | POA: Insufficient documentation

## 2013-02-01 NOTE — Progress Notes (Signed)
Echocardiogram performed.  

## 2013-02-15 ENCOUNTER — Encounter: Payer: Self-pay | Admitting: Neurology

## 2013-02-19 ENCOUNTER — Ambulatory Visit (INDEPENDENT_AMBULATORY_CARE_PROVIDER_SITE_OTHER): Payer: Medicare Other | Admitting: Neurology

## 2013-02-19 ENCOUNTER — Encounter: Payer: Self-pay | Admitting: Neurology

## 2013-02-19 ENCOUNTER — Encounter (INDEPENDENT_AMBULATORY_CARE_PROVIDER_SITE_OTHER): Payer: Self-pay

## 2013-02-19 VITALS — BP 145/85 | HR 69 | Ht 61.75 in | Wt 178.0 lb

## 2013-02-19 DIAGNOSIS — G609 Hereditary and idiopathic neuropathy, unspecified: Secondary | ICD-10-CM

## 2013-02-19 DIAGNOSIS — R269 Unspecified abnormalities of gait and mobility: Secondary | ICD-10-CM

## 2013-02-19 DIAGNOSIS — R2681 Unsteadiness on feet: Secondary | ICD-10-CM

## 2013-02-19 NOTE — Patient Instructions (Signed)
Overall you are doing fairly well but I do want to suggest a few things today:   Remember to drink plenty of fluid, eat healthy meals and do not skip any meals. Try to eat protein with a every meal and eat a healthy snack such as fruit or nuts in between meals. Try to keep a regular sleep-wake schedule and try to exercise daily, particularly in the form of walking, 20-30 minutes a day, if you can.   As far as diagnostic testing:  1)We will check a MRI of the cervical spine  I would like to see you back once the imaging is completed, sooner if we need to. Please call us with any interim questions, concerns, problems, updates or refill requests.   Please also call us for any test results so we can go over those with you on the phone.  My clinical assistant and will answer any of your questions and relay your messages to me and also relay most of my messages to you.   Our phone number is [phone]. We also have an after hours call service for urgent matters and there is a physician on-call for urgent questions. For any emergencies you know to call 911 or go to the nearest emergency room

## 2013-02-19 NOTE — Progress Notes (Signed)
[facility]    Provider:  Dr Janann Colonel Referring Provider: Antony Blackbird, MD Primary Care Physician:  Antony Blackbird, MD  CC:  Gait instability  HPI:  Nancy [name] is a 73 y.o. female here as a referral from Dr. Chapman Fitch for gait instability  Symptoms began around Christmas 2014, noted trouble walking, would feel off balance, felt like she was going to tip over. Would tip/veer to both right and left. Denies any dizziness or vertigo sensation. During these episodes had a lot of cervical neck pain. Patient notes using hydrocodone, valium and flexeril around this time for the pain and muscle spasms. Has not been taking these medications as much recently. Notes some generalized weakness during this time.   She feels that her symptoms have improved, has been doing PT for gait instability and back pain. Notes chronic history of cervical, lumbar pain and bilateral knee pain.   PCP notes: For past few weeks notes a progression in her neck pain, going along with this has had worsening gait instability, bumping into things, feels she would fall if there wasn't something to hold onto. Has history of chronic cervical and lumbar disease. Has poorly controlled DM, HbA1c has been >10, most recent 7.4. B12 >300.    Review of Systems: Out of a complete 14 system review, the patient complains of only the following symptoms, and all other reviewed systems are negative. Positive dizziness difficulty walking knee pain  History   Social History  . Marital Status: Divorced    Spouse Name: N/A    Number of Children: 28  . Years of Education: 12   Occupational History  . Not on file.   Social History Main Topics  . Smoking status: Never Smoker   . Smokeless tobacco: Not on file  . Alcohol Use: Yes  . Drug Use: No  . Sexual Activity: Not on file   Other Topics Concern  . Not on file   Social History Narrative   Patient is single, has 3 children living 1 deceased   Patient is  right handed   Education level is 12   Caffeine consumption is 0    Family History  Problem Relation Age of Onset  . Heart disease Mother   . Cancer Sister     ovarian    Past Medical History  Diagnosis Date  . Gout   . Type II diabetes mellitus   . Chest pain   . Osteopenia   . Gastric ulcer   . Hyperlipidemia   . Osteoarthritis   . Breast cyst     left breast  . Hypertension   . Diastolic dysfunction     with mild pulmonary HTN by echo 07/2011    Past Surgical History  Procedure Laterality Date  . Abdominal hysterectomy    . Abdominal adhesion surgery    . Breast lumpectomy      left  . Cholecystectomy    . Appendectomy    . Anus surgery    . Breast biopsy      Current Outpatient Prescriptions  Medication Sig Dispense Refill  . acetaminophen (TYLENOL) 325 MG tablet Take 325 mg by mouth every 6 (six) hours as needed.      [name] allopurinol (ZYLOPRIM) 100 MG tablet Take 200 mg by mouth daily.       [name] amLODipine (NORVASC) 10 MG tablet Take 10 mg by mouth daily.      [name] amoxicillin (AMOXIL) 500 MG capsule Take 500 mg by mouth  3 (three) times daily.      [name] aspirin EC 81 MG tablet Take 81 mg by mouth daily.      . colchicine (COLCRYS) 0.6 MG tablet Take 0.6 mg by mouth every other day.       . cyclobenzaprine (FLEXERIL) 10 MG tablet Take 1 tablet (10 mg total) by mouth 2 (two) times daily as needed for muscle spasms.  20 tablet  0  . diazepam (VALIUM) 5 MG tablet Take 5 mg by mouth at bedtime as needed for muscle spasms (Take 1/2 tablet or 1 tablet as needed at bedtime).      . furosemide (LASIX) 20 MG tablet Take 20 mg by mouth.      . gabapentin (NEURONTIN) 300 MG capsule Take 300 mg by mouth at bedtime.       [name] glipiZIDE (GLUCOTROL) 5 MG tablet Take 5 mg by mouth daily before breakfast.      . HYDROcodone-acetaminophen (NORCO/VICODIN) 5-325 MG per tablet       . lisinopril (PRINIVIL,ZESTRIL) 40 MG tablet Take 40 mg by mouth daily.        . metFORMIN (GLUCOPHAGE) 1000  MG tablet Take 1,000 mg by mouth 2 (two) times daily with a meal.        . metoprolol (LOPRESSOR) 100 MG tablet Take 100 mg by mouth daily.       [name] omeprazole (PRILOSEC) 20 MG capsule Take 20 mg by mouth daily as needed (acid reflux).       . predniSONE (DELTASONE) 5 MG tablet Take 5 mg by mouth every other day. For swelling      . traZODone (DESYREL) 50 MG tablet Take 50 mg by mouth at bedtime. Take 1 to 2 tablets at bedtime to help with sleep      . Vitamin D, Ergocalciferol, (DRISDOL) 50000 UNITS CAPS Take 50,000 Units by mouth every 7 (seven) days.       No current facility-administered medications for this visit.    Allergies as of 02/19/2013 - Review Complete 02/19/2013  Allergen Reaction Noted  . Codeine Other (See Comments) 12/22/2010    Vitals: BP 145/85  Pulse 69  Ht 5' 1.75" (1.568 m)  Wt 178 lb (80.74 kg)  BMI 32.84 kg/m2 Last Weight:  Wt Readings from Last 1 Encounters:  02/19/13 178 lb (80.74 kg)   Last Height:   Ht Readings from Last 1 Encounters:  02/19/13 5' 1.75" (1.568 m)     Physical exam: Exam: Gen: NAD, conversant Eyes: anicteric sclerae, moist conjunctivae HENT: Atraumatic, oropharynx clear Neck: Trachea midline; supple,  Lungs: CTA, no wheezing, rales, rhonic                          CV: RRR, no MRG Abdomen: Soft, non-tender;  Extremities: No peripheral edema  Skin: Normal temperature, no rash,  Psych: Appropriate affect, pleasant  Neuro: MS: AA&Ox3, appropriately interactive, normal affect   Speech: fluent w/o paraphasic error  Memory: good recent and remote recall  CN: PERRL, EOMI no nystagmus, no ptosis, sensation intact to LT V1-V3 bilat, face symmetric, no weakness, hearing grossly intact, palate elevates symmetrically, shoulder shrug 5/5 bilat,  tongue protrudes midline, no fasiculations noted.  Motor: normal bulk and tone Strength: 5/5  In all extremities  Coord: rapid alternating and point-to-point (FNF, HTS) movements  intact.  Reflexes: diminished but symmetrical, bilat downgoing toes  Sens:intact LT and PP in all extremities, decreased vibration and mildly decreased proprioception  bilateral LE  Gait: stands slowly without assistance, narrow based, good arm swing, unsteady, veers to right and left. Able to toe walk, multiple mis-steps with tandem walking. Wobbles but does not fall with eyes closed in Romberg test, steady with eyes open   Assessment:  After physical and neurologic examination, review of laboratory studies, imaging, neurophysiology testing and pre-existing records, assessment will be reviewed on the problem list.  Plan:  Treatment plan and additional workup will be reviewed under Problem List.  1)Gait instability 2)Peripheral neuropathy  72y/o woman presenting for initial evaluation of gait instability. Physical exam pertinent for some signs of LE peripheral neuropathy. Suspect patients gait instability is likely multifactorial and related to peripheral neuropathy, recent polypharmacy and possible cervical cord involvement. Will check MRI C spine, continue with PT for gait training, follow up with PCP for blood sugar/DM control. Patient states she has tapered down usage of hydrocodone and muscle relaxants. Have lower suspicion her symptoms represent a cerebellar process such as CVA, will hold off on brain imaging at this time. Follow up once MRI completed.    Jim Like, DO  [facility] [address] [place], [place] [address]  Phone [phone] Fax [phone]

## 2013-03-02 ENCOUNTER — Ambulatory Visit
Admission: RE | Admit: 2013-03-02 | Discharge: 2013-03-02 | Disposition: A | Discharge status: Home / Self Care | Payer: PRIVATE HEALTH INSURANCE | Source: Ambulatory Visit | Attending: Neurology | Admitting: Neurology

## 2013-03-02 DIAGNOSIS — R269 Unspecified abnormalities of gait and mobility: Secondary | ICD-10-CM

## 2013-03-02 DIAGNOSIS — R2681 Unsteadiness on feet: Secondary | ICD-10-CM

## 2013-03-06 NOTE — Progress Notes (Signed)
Quick Note:  Spoke with patient to inform her of normal MRI Cervical spine results, patient expressed understanding, and will call back with any questions or concerns ______

## 2013-03-15 ENCOUNTER — Other Ambulatory Visit: Payer: Self-pay

## 2013-03-15 MED ORDER — FUROSEMIDE 20 MG PO TABS: 20.0000 mg | ORAL_TABLET | Freq: Every day | ORAL | Status: DC

## 2013-06-22 ENCOUNTER — Encounter (HOSPITAL_COMMUNITY): Payer: Self-pay | Admitting: Pharmacy Technician

## 2013-07-05 ENCOUNTER — Encounter (HOSPITAL_COMMUNITY): Payer: Self-pay | Admitting: *Deleted

## 2013-07-17 ENCOUNTER — Other Ambulatory Visit: Payer: Self-pay | Admitting: Gastroenterology

## 2013-07-17 NOTE — Addendum Note (Signed)
Addended by: Arta Silence on: 07/17/2013 04:25 PM   Modules accepted: Orders

## 2013-07-18 ENCOUNTER — Ambulatory Visit (HOSPITAL_COMMUNITY)
Admission: RE | Admit: 2013-07-18 | Discharge: 2013-07-18 | Disposition: A | Discharge status: Home / Self Care | Payer: PRIVATE HEALTH INSURANCE | Source: Ambulatory Visit | Attending: Gastroenterology | Admitting: Gastroenterology

## 2013-07-18 ENCOUNTER — Encounter (HOSPITAL_COMMUNITY): Payer: PRIVATE HEALTH INSURANCE | Admitting: Anesthesiology

## 2013-07-18 ENCOUNTER — Encounter (HOSPITAL_COMMUNITY)
Admission: RE | Disposition: A | Discharge status: Home / Self Care | Payer: Self-pay | Source: Ambulatory Visit | Attending: Gastroenterology

## 2013-07-18 ENCOUNTER — Encounter (HOSPITAL_COMMUNITY): Payer: Self-pay

## 2013-07-18 ENCOUNTER — Ambulatory Visit (HOSPITAL_COMMUNITY): Payer: PRIVATE HEALTH INSURANCE | Admitting: Anesthesiology

## 2013-07-18 DIAGNOSIS — R748 Abnormal levels of other serum enzymes: Secondary | ICD-10-CM | POA: Diagnosis not present

## 2013-07-18 DIAGNOSIS — K279 Peptic ulcer, site unspecified, unspecified as acute or chronic, without hemorrhage or perforation: Secondary | ICD-10-CM | POA: Diagnosis not present

## 2013-07-18 DIAGNOSIS — K2960 Other gastritis without bleeding: Secondary | ICD-10-CM | POA: Insufficient documentation

## 2013-07-18 DIAGNOSIS — K219 Gastro-esophageal reflux disease without esophagitis: Secondary | ICD-10-CM | POA: Diagnosis not present

## 2013-07-18 DIAGNOSIS — R1013 Epigastric pain: Secondary | ICD-10-CM | POA: Diagnosis present

## 2013-07-18 DIAGNOSIS — Z800 Family history of malignant neoplasm of digestive organs: Secondary | ICD-10-CM | POA: Diagnosis not present

## 2013-07-18 DIAGNOSIS — E119 Type 2 diabetes mellitus without complications: Secondary | ICD-10-CM | POA: Insufficient documentation

## 2013-07-18 DIAGNOSIS — I10 Essential (primary) hypertension: Secondary | ICD-10-CM | POA: Insufficient documentation

## 2013-07-18 HISTORY — PX: EUS: SHX5427

## 2013-07-18 HISTORY — DX: Other congenital malformations of spine, not associated with scoliosis: Q76.49

## 2013-07-18 HISTORY — PX: ESOPHAGOGASTRODUODENOSCOPY (EGD) WITH PROPOFOL: SHX5813

## 2013-07-18 HISTORY — DX: Adverse effect of unspecified anesthetic, initial encounter: T41.45XA

## 2013-07-18 HISTORY — DX: Gastro-esophageal reflux disease without esophagitis: K21.9

## 2013-07-18 LAB — GLUCOSE, CAPILLARY: Glucose-Capillary: 144 mg/dL — ABNORMAL HIGH (ref 70–99)

## 2013-07-18 SURGERY — ESOPHAGOGASTRODUODENOSCOPY (EGD) WITH PROPOFOL
Anesthesia: Monitor Anesthesia Care

## 2013-07-18 MED ORDER — LACTATED RINGERS IV SOLN
INTRAVENOUS | Status: DC | PRN
  Administered 2013-07-18: 10:00:00 via INTRAVENOUS

## 2013-07-18 MED ORDER — BUTAMBEN-TETRACAINE-BENZOCAINE 2-2-14 % EX AERO
INHALATION_SPRAY | CUTANEOUS | Status: DC | PRN
  Administered 2013-07-18: 2 via TOPICAL

## 2013-07-18 MED ORDER — GLYCOPYRROLATE 0.2 MG/ML IJ SOLN
INTRAMUSCULAR | Status: AC
  Filled 2013-07-18: qty 1

## 2013-07-18 MED ORDER — PROPOFOL INFUSION 10 MG/ML OPTIME
INTRAVENOUS | Status: DC | PRN
  Administered 2013-07-18: 140 ug/kg/min via INTRAVENOUS

## 2013-07-18 MED ORDER — PROPOFOL 10 MG/ML IV BOLUS
INTRAVENOUS | Status: AC
  Filled 2013-07-18: qty 20

## 2013-07-18 MED ORDER — FENTANYL CITRATE 0.05 MG/ML IJ SOLN
INTRAMUSCULAR | Status: DC | PRN
  Administered 2013-07-18 (×2): 50 ug via INTRAVENOUS

## 2013-07-18 MED ORDER — LIDOCAINE HCL (CARDIAC) 20 MG/ML IV SOLN
INTRAVENOUS | Status: AC
  Filled 2013-07-18: qty 5

## 2013-07-18 MED ORDER — LIDOCAINE HCL (CARDIAC) 20 MG/ML IV SOLN
INTRAVENOUS | Status: DC | PRN
  Administered 2013-07-18: 100 mg via INTRAVENOUS

## 2013-07-18 MED ORDER — FENTANYL CITRATE 0.05 MG/ML IJ SOLN
INTRAMUSCULAR | Status: AC
  Filled 2013-07-18: qty 2

## 2013-07-18 MED ORDER — GLYCOPYRROLATE 0.2 MG/ML IJ SOLN
INTRAMUSCULAR | Status: DC | PRN
  Administered 2013-07-18: 0.2 mg via INTRAVENOUS

## 2013-07-18 SURGICAL SUPPLY — 15 items

## 2013-07-18 NOTE — Op Note (Signed)
[facility] [place], [address]   ENDOSCOPIC ULTRASOUND PROCEDURE REPORT  PATIENT: Camora, Tremain  MR#: [id number] BIRTHDATE: [birth year]  GENDER: Female ENDOSCOPIST: Arta Silence, MD REFERRED BY:  Cammie Fulp PROCEDURE DATE:  07/18/2013 PROCEDURE:   Upper EUS ASA CLASS:      Class II INDICATIONS:   1.  epigastric abdominal pain, hyperlipasemia, family history of pancreatic cancer MEDICATIONS: MAC sedation, administered by CRNA  DESCRIPTION OF PROCEDURE:   After the risks benefits and alternatives of the procedure were  explained, informed consent was obtained. The patient was then placed in the left, lateral, decubitus postion and IV sedation was administered. Throughout the procedure, the patients blood pressure, pulse and oxygen saturations were monitored continuously.  Under direct visualization, the standard forward-viewing endoscope followed by the forward-viewing radial echoendoscope were sequentially introduced through the mouth  and advanced to the second portion of the duodenum .  Water was used as necessary to provide an acoustic interface.  Upon completion of the imaging, water was removed and the patient was sent to the recovery room in satisfactory condition.    FINDINGS:      EGD:  Mild antral gastritis.  Otherwise normal endoscopy to the second portion of the duodenum. EUS: Scattered hyperechoic strands/foci, otherwise normal pancreas; no pancreatic mass, chronic pancreatitis, peripancreatic adenopathy, or pancreatic cysts were identified.  Ampulla a bit bulbous but otherwise normal-appearing both endoscopically and via EUS.  Bile duct non-dilated and no evidence of bile duct mass or choledocholithiasis were identified.  Low cystic duct take-off incidentally noted.  Post-cholecystectomy.  IMPRESSION:     As above.  No specific abnormality seen to account for her abdominal pain and mild hyperlipasemia.  Perhaps she  had previously passed a bile duct stone.  No evidence of pancreatic mass or chronic pancreatitis.  RECOMMENDATIONS:     1.  Watch for potential complications of procedure. 2.  If symptoms persist, consider trial of PPI +/- sucralfate. 3.  Follow-up with [facility] in 3 months, or sooner if symptoms worsen.   _______________________________ Lorrin MaisArta Silence, MD 07/18/2013 11:07 AM   CC:

## 2013-07-18 NOTE — Transfer of Care (Signed)
Immediate Anesthesia Transfer of Care Note  Patient: Nancy [name]  Procedure(s) Performed: Procedure(s): ESOPHAGOGASTRODUODENOSCOPY (EGD) WITH PROPOFOL (N/A) ESOPHAGEAL ENDOSCOPIC ULTRASOUND (EUS) RADIAL (N/A)  Patient Location: PACU  Anesthesia Type:MAC  Level of Consciousness: awake, alert  and oriented  Airway & Oxygen Therapy: Patient Spontanous Breathing and Patient connected to nasal cannula oxygen  Post-op Assessment: Report given to PACU RN  Post vital signs: Reviewed and stable  Complications: No apparent anesthesia complications

## 2013-07-18 NOTE — Discharge Instructions (Signed)
Esophagogastroduodenoscopy °Care After °Refer to this sheet in the next few weeks. These instructions provide you with information on caring for yourself after your procedure. Your caregiver may also give you more specific instructions. Your treatment has been planned according to current medical practices, but problems sometimes occur. Call your caregiver if you have any problems or questions after your procedure.  °HOME CARE INSTRUCTIONS °· Do not eat or drink anything until the numbing medicine (local anesthetic) has worn off and your gag reflex has returned. You will know that the local anesthetic has worn off when you can swallow comfortably. °· Do not drive for 12 hours after the procedure or as directed by your caregiver. °· Only take medicines as directed by your caregiver. °SEEK MEDICAL CARE IF:  °· You cannot stop coughing. °· You are not urinating at all or less than usual. °SEEK IMMEDIATE MEDICAL CARE IF: °· You have difficulty swallowing. °· You cannot eat or drink. °· You have worsening throat or chest pain. °· You have dizziness, lightheadedness, or you faint. °· You have nausea or vomiting. °· You have chills. °· You have a fever. °· You have severe abdominal pain. °· You have black, tarry, or bloody stools. °Document Released: 12/15/2011 Document Reviewed: 12/15/2011 °ExitCare® Patient Information ©2015 [employer]. This information is not intended to replace advice given to you by your health care provider. Make sure you discuss any questions you have with your health care provider. ° °

## 2013-07-18 NOTE — Anesthesia Postprocedure Evaluation (Signed)
Anesthesia Post Note  Patient: Nancy [name]  Procedure(s) Performed: Procedure(s) (LRB): ESOPHAGOGASTRODUODENOSCOPY (EGD) WITH PROPOFOL (N/A) ESOPHAGEAL ENDOSCOPIC ULTRASOUND (EUS) RADIAL (N/A)  Anesthesia type: MAC  Patient location: PACU  Post pain: Pain level controlled  Post assessment: Post-op Vital signs reviewed  Last Vitals:  Filed Vitals:   07/18/13 1104  BP: 128/67  Pulse: 76  Temp:   Resp: 14    Post vital signs: Reviewed  Level of consciousness: sedated  Complications: No apparent anesthesia complications

## 2013-07-18 NOTE — H&P (Signed)
Patient interval history reviewed.  Patient examined again.  There has been no change from documented H/P dated 06/19/13 (scanned into chart from our office) except as documented above.  Assessment:  1.  Upper abdominal pain. 2.  Hyperlipasemia.  Plan:  1.  Endoscopy with and without ultrasound (EGD/EUS) with possible mucosal or fine needle aspiration biopsies, as needed. 2.  Risks (bleeding, infection, bowel perforation that could require surgery, sedation-related changes in cardiopulmonary systems), benefits (identification and possible treatment of source of symptoms, exclusion of certain causes of symptoms), and alternatives (watchful waiting, radiographic imaging studies, empiric medical treatment) of upper endoscopy with and without ultrasound with or without biopsies (EGD +/- biopsies, + EUS +/- FNA) were explained to patient/family in detail and patient wishes to proceed.

## 2013-07-18 NOTE — Anesthesia Preprocedure Evaluation (Addendum)
Anesthesia Evaluation  Patient identified by MRN, date of birth, ID band Patient awake    Reviewed: Allergy & Precautions, H&P , NPO status , Patient's Chart, lab work & pertinent test results  History of Anesthesia Complications (+) PROLONGED EMERGENCE  Airway Mallampati: III TM Distance: >3 FB Neck ROM: Full    Dental  (+) Edentulous Upper, Dental Advisory Given, Missing   Pulmonary neg pulmonary ROS,  breath sounds clear to auscultation  Pulmonary exam normal       Cardiovascular hypertension, Pt. on medications Rhythm:Regular Rate:Normal     Neuro/Psych negative neurological ROS  negative psych ROS   GI/Hepatic Neg liver ROS, PUD, GERD-  Medicated,  Endo/Other  diabetes, Type 2, Oral Hypoglycemic Agents  Renal/GU negative Renal ROS  negative genitourinary   Musculoskeletal negative musculoskeletal ROS (+)   Abdominal   Peds  Hematology negative hematology ROS (+)   Anesthesia Other Findings   Reproductive/Obstetrics                          Anesthesia Physical Anesthesia Plan  ASA: II  Anesthesia Plan: MAC   Post-op Pain Management:    Induction: Intravenous  Airway Management Planned: Simple Face Mask and Nasal Cannula  Additional Equipment:   Intra-op Plan:   Post-operative Plan: Extubation in OR  Informed Consent: I have reviewed the patients History and Physical, chart, labs and discussed the procedure including the risks, benefits and alternatives for the proposed anesthesia with the patient or authorized representative who has indicated his/her understanding and acceptance.   Dental advisory given  Plan Discussed with: CRNA  Anesthesia Plan Comments:         Anesthesia Quick Evaluation

## 2013-07-19 ENCOUNTER — Encounter (HOSPITAL_COMMUNITY): Payer: Self-pay | Admitting: Gastroenterology

## 2013-08-09 ENCOUNTER — Other Ambulatory Visit: Payer: Self-pay

## 2013-08-09 DIAGNOSIS — Z1231 Encounter for screening mammogram for malignant neoplasm of breast: Secondary | ICD-10-CM

## 2013-08-28 ENCOUNTER — Ambulatory Visit
Admission: RE | Admit: 2013-08-28 | Discharge: 2013-08-28 | Disposition: A | Discharge status: Home / Self Care | Payer: PRIVATE HEALTH INSURANCE | Source: Ambulatory Visit

## 2013-08-28 DIAGNOSIS — Z1231 Encounter for screening mammogram for malignant neoplasm of breast: Secondary | ICD-10-CM

## 2013-10-29 ENCOUNTER — Other Ambulatory Visit: Payer: Self-pay | Admitting: Family Medicine

## 2013-10-29 DIAGNOSIS — R0989 Other specified symptoms and signs involving the circulatory and respiratory systems: Secondary | ICD-10-CM

## 2013-11-01 ENCOUNTER — Encounter (INDEPENDENT_AMBULATORY_CARE_PROVIDER_SITE_OTHER): Payer: Self-pay

## 2013-11-01 ENCOUNTER — Ambulatory Visit
Admission: RE | Admit: 2013-11-01 | Discharge: 2013-11-01 | Disposition: A | Discharge status: Home / Self Care | Payer: PRIVATE HEALTH INSURANCE | Source: Ambulatory Visit | Attending: Family Medicine | Admitting: Family Medicine

## 2013-11-01 DIAGNOSIS — R0989 Other specified symptoms and signs involving the circulatory and respiratory systems: Secondary | ICD-10-CM

## 2014-01-02 ENCOUNTER — Other Ambulatory Visit: Payer: Self-pay | Admitting: Family Medicine

## 2014-01-02 DIAGNOSIS — N644 Mastodynia: Secondary | ICD-10-CM

## 2014-01-16 ENCOUNTER — Ambulatory Visit
Admission: RE | Admit: 2014-01-16 | Discharge: 2014-01-16 | Disposition: A | Discharge status: Home / Self Care | Payer: Medicare Other | Source: Ambulatory Visit | Attending: Family Medicine | Admitting: Family Medicine

## 2014-01-16 DIAGNOSIS — N644 Mastodynia: Secondary | ICD-10-CM

## 2014-01-17 ENCOUNTER — Encounter: Payer: Self-pay | Admitting: Cardiology

## 2014-01-17 ENCOUNTER — Ambulatory Visit (INDEPENDENT_AMBULATORY_CARE_PROVIDER_SITE_OTHER): Payer: Medicare Other | Admitting: Cardiology

## 2014-01-17 ENCOUNTER — Ambulatory Visit: Payer: Medicaid Other | Admitting: Cardiology

## 2014-01-17 VITALS — BP 140/82 | HR 71 | Ht 61.0 in | Wt 188.0 lb

## 2014-01-17 DIAGNOSIS — I519 Heart disease, unspecified: Secondary | ICD-10-CM

## 2014-01-17 DIAGNOSIS — I5189 Other ill-defined heart diseases: Secondary | ICD-10-CM

## 2014-01-17 DIAGNOSIS — R0989 Other specified symptoms and signs involving the circulatory and respiratory systems: Secondary | ICD-10-CM

## 2014-01-17 DIAGNOSIS — I10 Essential (primary) hypertension: Secondary | ICD-10-CM

## 2014-01-17 NOTE — Patient Instructions (Signed)
Your physician recommends that you continue on your current medications as directed. Please refer to the Current Medication list given to you today.  Your physician wants you to follow-up in: 1 year with Dr. Turner. You will receive a reminder letter in the mail two months in advance. If you don't receive a letter, please call our office to schedule the follow-up appointment.  

## 2014-01-17 NOTE — Progress Notes (Signed)
[facility], [place], [place]  [address] Phone: [phone] Fax:  [phone]  Date:  01/17/2014   ID:  Nancy [name], DOB 02/25/1940, MRN [id number]  PCP:  Antony Blackbird, MD  Cardiologist:  Fransico Him, MD    History of Present Illness: Nancy [name] is a 74 y.o. female with a history of HTN and diastolic dysfunction who presents today for followup. She is doing well. She denies any anginal chest pain, LE edema, palpitations or syncope. She has chronic exertional SOB and problems with fatigue that are stable and have not changed any.    Stress test for this in the past was normal.  She does not sleep well at night due to back pain and wakes up frequently at night.     Wt Readings from Last 3 Encounters:  01/17/14 188 lb (85.276 kg)  07/18/13 178 lb (80.74 kg)  02/19/13 178 lb (80.74 kg)     Past Medical History  Diagnosis Date  . Gout   . Type II diabetes mellitus   . Chest pain   . Osteopenia   . Gastric ulcer   . Hyperlipidemia   . Osteoarthritis   . Breast cyst     left breast  . Hypertension   . Diastolic dysfunction     with mild pulmonary HTN by echo 07/2011  . GERD (gastroesophageal reflux disease)   . Spine deformity     lower spine  . Complication of anesthesia 1986    slow to awaken after cholecystectomy    Current Outpatient Prescriptions  Medication Sig Dispense Refill  . acetaminophen (TYLENOL) 325 MG tablet Take 325 mg by mouth every 6 (six) hours as needed (Pain).     [name] allopurinol (ZYLOPRIM) 100 MG tablet Take 200 mg by mouth daily.     [name] amLODipine (NORVASC) 10 MG tablet Take 10 mg by mouth every morning.     [name] aspirin EC 81 MG tablet Take 81 mg by mouth daily.    . colchicine (COLCRYS) 0.6 MG tablet Take 0.6 mg by mouth daily as needed (Gout).     [name] dexlansoprazole (DEXILANT) 60 MG capsule Take 60 mg by mouth daily.    . furosemide (LASIX) 20 MG tablet Take 1 tablet (20 mg total) by mouth daily. 30 tablet 6  . gabapentin  (NEURONTIN) 300 MG capsule Take 300 mg by mouth at bedtime.     [name] glipiZIDE (GLUCOTROL) 5 MG tablet Take 5 mg by mouth daily before breakfast.    . lisinopril (PRINIVIL,ZESTRIL) 40 MG tablet Take 40 mg by mouth every morning.     . metFORMIN (GLUCOPHAGE) 1000 MG tablet Take 1,000 mg by mouth 2 (two) times daily with a meal.      . metoprolol (LOPRESSOR) 100 MG tablet Take 100 mg by mouth every morning.     [name] omeprazole (PRILOSEC) 20 MG capsule Take 20 mg by mouth daily as needed (acid reflux).     Vladimir Faster Glycol-Propyl Glycol (SYSTANE OP) Apply 1 drop to eye as needed (Dry eyes).    . predniSONE (DELTASONE) 5 MG tablet Take 5 mg by mouth every other day as needed (Swelling). For swelling    . simvastatin (ZOCOR) 20 MG tablet Take 20 mg by mouth every evening.     . traZODone (DESYREL) 50 MG tablet Take 50-100 mg by mouth at bedtime. Take 1 to 2 tablets at bedtime to help with sleep    . diazepam (VALIUM) 5  MG tablet Take 2.5-5 mg by mouth at bedtime as needed for muscle spasms (Take 1/2 tablet or 1 tablet as needed at bedtime).      No current facility-administered medications for this visit.    Allergies:    Allergies  Allergen Reactions  . Codeine Other (See Comments)    headache    Social History:  The patient  reports that she has never smoked. She has never used smokeless tobacco. She reports that she drinks alcohol. She reports that she does not use illicit drugs.   Family History:  The patient's family history includes Cancer in her sister; Heart disease in her mother.   ROS:  Please see the history of present illness.      All other systems reviewed and negative.   PHYSICAL EXAM: VS:  BP 140/82 mmHg  Pulse 71  Ht 5\' 1"  (1.549 m)  Wt 188 lb (85.276 kg)  BMI 35.54 kg/m2 Well nourished, well developed, in no acute distress HEENT: normal Neck: no JVD Cardiac:  normal S1, S2; RRR; no murmur Lungs:  clear to auscultation bilaterally, no wheezing, rhonchi or rales Abd:  soft, nontender, no hepatomegaly Ext: no edema Skin: warm and dry Neuro:  CNs 2-12 intact, no focal abnormalities noted  EKG:  NSR with possible anterior infarct - no change from prior EKG  ASSESSMENT AND PLAN:  1.   Diastolic Dysfunction 2.   HTN - well controlled - continue amlodipine/Lisinopril/metoprolol    3. Mild pulmonary HTN - none on last echo    4.  Type II DM - per PCP  Followup with me in 1 year    Signed, Fransico Him, MD [facility] 01/17/2014 10:12 AM

## 2014-06-02 ENCOUNTER — Other Ambulatory Visit: Payer: Self-pay | Admitting: Cardiology

## 2014-06-04 NOTE — Telephone Encounter (Signed)
Per note 1.7.16

## 2014-08-19 ENCOUNTER — Telehealth: Payer: Self-pay | Admitting: Cardiology

## 2014-08-19 DIAGNOSIS — I498 Other specified cardiac arrhythmias: Secondary | ICD-10-CM

## 2014-08-19 DIAGNOSIS — R002 Palpitations: Secondary | ICD-10-CM

## 2014-08-19 NOTE — Telephone Encounter (Signed)
Place a 30 day event monitor

## 2014-08-19 NOTE — Telephone Encounter (Signed)
Patient called c/o foot swelling, an intermittent "flutter" in her chest, and fatigue for one week. Patient st her feet are not so swollen she cannot wear shoes.  She went to the doctor last week and her BP was elevated at 170/80 (this is an estimate). She had not yet taken her medications that day. She describes the "flutter" as "feeling pins in her chest that go down her back." These episodes last seconds at a time.  She has taken her medications as directed. Patient is scheduled to for an OV with Dr. Radford Pax 8/10.

## 2014-08-19 NOTE — Telephone Encounter (Signed)
Event monitor ordered for scheduling same day as OV.

## 2014-08-19 NOTE — Telephone Encounter (Signed)
New Message   Pt c/o of chest discomfort ("Fluttering" feeling) and swelling in her feet that started about 1 week ago. Pt contacted PCP who told her to make appt w/ Dr Radford Pax- made appt for WEd 8/10 @ 11:15. Please call back and discuss.  Pt c/o swelling: STAT is pt has developed SOB within 24 hours  1. How long have you been experiencing swelling? 1 week   2. Where is the swelling located? feet  3.  Are you currently taking a "fluid pill"? yes  4.  Are you currently SOB? Only after exertion   5.  Have you traveled recently? Yes but not out of country

## 2014-08-20 ENCOUNTER — Other Ambulatory Visit: Payer: Self-pay | Admitting: Family Medicine

## 2014-08-20 DIAGNOSIS — N6320 Unspecified lump in the left breast, unspecified quadrant: Secondary | ICD-10-CM

## 2014-08-21 ENCOUNTER — Ambulatory Visit (INDEPENDENT_AMBULATORY_CARE_PROVIDER_SITE_OTHER): Payer: Medicare Other | Admitting: Cardiology

## 2014-08-21 ENCOUNTER — Encounter: Payer: Self-pay | Admitting: Cardiology

## 2014-08-21 VITALS — BP 158/74 | HR 76 | Ht 61.0 in | Wt 188.8 lb

## 2014-08-21 DIAGNOSIS — I519 Heart disease, unspecified: Secondary | ICD-10-CM

## 2014-08-21 DIAGNOSIS — I5189 Other ill-defined heart diseases: Secondary | ICD-10-CM

## 2014-08-21 DIAGNOSIS — R079 Chest pain, unspecified: Secondary | ICD-10-CM

## 2014-08-21 DIAGNOSIS — I10 Essential (primary) hypertension: Secondary | ICD-10-CM | POA: Diagnosis not present

## 2014-08-21 DIAGNOSIS — R0989 Other specified symptoms and signs involving the circulatory and respiratory systems: Secondary | ICD-10-CM | POA: Diagnosis not present

## 2014-08-21 NOTE — Patient Instructions (Addendum)
Medication Instructions:  Your physician recommends that you continue on your current medications as directed. Please refer to the Current Medication list given to you today.   Labwork: None  Testing/Procedures: Your physician has requested that you have a lexiscan myoview. For further information please visit [email or web address]. Please follow instruction sheet, as given.  Follow-Up: Your physician wants you to follow-up in: 1 year with Dr. Radford Pax. You will receive a reminder letter in the mail two months in advance. If you don't receive a letter, please call our office to schedule the follow-up appointment.   Any Other Special Instructions Will Be Listed Below (If Applicable).

## 2014-08-21 NOTE — Progress Notes (Signed)
Cardiology Office Note   Date:  08/21/2014   ID:  Nancy [name], DOB 1940/05/06, MRN [id number]  PCP:  Antony Blackbird, MD    Chief Complaint  Patient presents with  . Follow-up    essential hypertension, benign      History of Present Illness: Nancy [name] is a 74 y.o. female with a history of HTN and diastolic dysfunction who presents today for followup. She has been having some episodes of chest pain recently that occurs mainly with no exertion although it can occur when she is up moving around.  It is a sharp pain with no radiation and occurs in her left breast.  She denies any nausea or diaphoresis with the pain.  She denies any palpitations or syncope. She occasionally has some mild ankle edema but none today.  She has chronic exertional SOB and problems with fatigue that are stable and have not changed any. Stress test for this in the past was normal.  Past Medical History  Diagnosis Date  . Gout   . Type II diabetes mellitus   . Chest pain   . Osteopenia   . Gastric ulcer   . Hyperlipidemia   . Osteoarthritis   . Breast cyst     left breast  . Hypertension   . Diastolic dysfunction     with mild pulmonary HTN by echo 07/2011  . GERD (gastroesophageal reflux disease)   . Spine deformity     lower spine  . Complication of anesthesia 1986    slow to awaken after cholecystectomy    Past Surgical History  Procedure Laterality Date  . Abdominal hysterectomy    . Abdominal adhesion surgery    . Breast lumpectomy      left  . Appendectomy    . Anus surgery      for torn tissue  . Breast biopsy    . Cholecystectomy  1986  . Esophagogastroduodenoscopy (egd) with propofol N/A 07/18/2013    Procedure: ESOPHAGOGASTRODUODENOSCOPY (EGD) WITH PROPOFOL;  Surgeon: Arta Silence, MD;  Location: [facility];  Service: Endoscopy;  Laterality: N/A;  . Eus N/A 07/18/2013    Procedure: ESOPHAGEAL ENDOSCOPIC ULTRASOUND (EUS) RADIAL;  Surgeon:  Arta Silence, MD;  Location: [facility];  Service: Endoscopy;  Laterality: N/A;     Current Outpatient Prescriptions  Medication Sig Dispense Refill  . acetaminophen (TYLENOL) 325 MG tablet Take 325 mg by mouth every 6 (six) hours as needed (Pain).     [name] AIMSCO INSULIN SYR ULTRA THIN 31G X 5/16" 0.3 ML MISC     . allopurinol (ZYLOPRIM) 100 MG tablet Take 200 mg by mouth daily.     [name] amLODipine (NORVASC) 10 MG tablet Take 10 mg by mouth every morning.     [name] aspirin EC 81 MG tablet Take 81 mg by mouth daily.    . colchicine (COLCRYS) 0.6 MG tablet Take 0.6 mg by mouth daily as needed (Gout).     [name] dexlansoprazole (DEXILANT) 60 MG capsule Take 60 mg by mouth daily.    . diazepam (VALIUM) 5 MG tablet Take 2.5-5 mg by mouth at bedtime as needed for muscle spasms (Take 1/2 tablet or 1 tablet as needed at bedtime).     . furosemide (LASIX) 20 MG tablet TAKE ONE TABLET BY MOUTH ONCE DAILY 30 tablet 10  . gabapentin (NEURONTIN) 300 MG capsule Take 300 mg by mouth at  bedtime.     [name] glipiZIDE (GLUCOTROL) 5 MG tablet Take 5 mg by mouth daily before breakfast.    . HYDROcodone-acetaminophen (NORCO/VICODIN) 5-325 MG per tablet Take 1 tablet by mouth as needed.    [name] lisinopril (PRINIVIL,ZESTRIL) 40 MG tablet Take 40 mg by mouth every morning.     . metFORMIN (GLUCOPHAGE) 1000 MG tablet Take 1,000 mg by mouth 2 (two) times daily with a meal.      . metoprolol (LOPRESSOR) 100 MG tablet Take 100 mg by mouth every morning.     . nystatin cream (MYCOSTATIN) Apply 1 application topically 2 (two) times daily.    [name] omeprazole (PRILOSEC) 20 MG capsule Take 20 mg by mouth daily as needed (acid reflux).     . ONE TOUCH ULTRA TEST test strip     . Polyethyl Glycol-Propyl Glycol (SYSTANE OP) Apply 1 drop to eye as needed (Dry eyes).    . predniSONE (DELTASONE) 20 MG tablet Take 20 mg by mouth as needed (for swelling).     . simvastatin (ZOCOR) 20 MG tablet Take 20 mg by mouth every evening.     . traZODone  (DESYREL) 50 MG tablet Take 50-100 mg by mouth at bedtime. Take 1 to 2 tablets at bedtime to help with sleep     No current facility-administered medications for this visit.    Allergies:   Codeine    Social History:  The patient  reports that she has never smoked. She has never used smokeless tobacco. She reports that she drinks alcohol. She reports that she does not use illicit drugs.   Family History:  The patient's family history includes Cancer in her sister; Heart disease in her mother.    ROS:  Please see the history of present illness.   Otherwise, review of systems are positive for none.   All other systems are reviewed and negative.    PHYSICAL EXAM: VS:  BP 158/74 mmHg  Pulse 76  Ht 5\' 1"  (1.549 m)  Wt 188 lb 12.8 oz (85.639 kg)  BMI 35.69 kg/m2  SpO2 94% , BMI Body mass index is 35.69 kg/(m^2). GEN: Well nourished, well developed, in no acute distress HEENT: normal Neck: no JVD, carotid bruits, or masses Cardiac: RRR; no murmurs, rubs, or gallops,no edema  Respiratory:  clear to auscultation bilaterally, normal work of breathing GI: soft, nontender, nondistended, + BS MS: no deformity or atrophy Skin: warm and dry, no rash Neuro:  Strength and sensation are intact Psych: euthymic mood, full affect   EKG:  EKG is not ordered today.    Recent Labs: No results found for requested labs within last 365 days.    Lipid Panel No results found for: CHOL, TRIG, HDL, CHOLHDL, VLDL, LDLCALC, LDLDIRECT    Wt Readings from Last 3 Encounters:  08/21/14 188 lb 12.8 oz (85.639 kg)  01/17/14 188 lb (85.276 kg)  07/18/13 178 lb (80.74 kg)    ASSESSMENT AND PLAN:  1. Diastolic Dysfunction 2. HTN - borderline controlled - she has not taken her meds yet and she is having a lot of back pain. - continue amlodipine/Lisinopril/metoprolol  - 24 hour BP monitor  3. Mild pulmonary HTN - none on last echo  4. Type II DM - per PCP    5.  Chest pain that is  atypical but she has risk factors including HTN, DM and post menopause state.  I have recommended proceeding with Lexiscan myoview to rule out ischemia.  Current medicines are reviewed at length with the patient today.  The patient does not have concerns regarding medicines.  The following changes have been made:  no change  Labs/ tests ordered today: See above Assessment and Plan No orders of the defined types were placed in this encounter.     Disposition:   FU with me in 1 year  Signed, Sueanne Margarita, MD  08/21/2014 11:30 AM    [facility] [place], [place], [place]  [address] Phone: [phone]; Fax: [phone]

## 2014-08-22 ENCOUNTER — Telehealth (HOSPITAL_COMMUNITY): Payer: Self-pay

## 2014-08-22 NOTE — Telephone Encounter (Signed)
Patient given detailed instructions per Myocardial Perfusion Study Information Sheet for test on 08-27-2014 at 0900. Patient Notified to arrive 15 minutes early, and that it is imperative to arrive on time for appointment to keep from having the test rescheduled. Patient verbalized understanding. Nancy [name], Nancy [name]

## 2014-08-27 ENCOUNTER — Ambulatory Visit (HOSPITAL_COMMUNITY): Payer: Medicare Other | Attending: Cardiovascular Disease

## 2014-08-27 ENCOUNTER — Ambulatory Visit (INDEPENDENT_AMBULATORY_CARE_PROVIDER_SITE_OTHER): Payer: Medicare Other

## 2014-08-27 VITALS — Ht 61.0 in | Wt 188.0 lb

## 2014-08-27 DIAGNOSIS — R002 Palpitations: Secondary | ICD-10-CM

## 2014-08-27 DIAGNOSIS — R079 Chest pain, unspecified: Secondary | ICD-10-CM

## 2014-08-27 DIAGNOSIS — R0602 Shortness of breath: Secondary | ICD-10-CM

## 2014-08-27 DIAGNOSIS — G4440 Drug-induced headache, not elsewhere classified, not intractable: Secondary | ICD-10-CM

## 2014-08-27 LAB — MYOCARDIAL PERFUSION IMAGING
CHL CUP NUCLEAR SDS: 6
CHL CUP NUCLEAR SSS: 7
CSEPPHR: 104 {beats}/min
LHR: 0.27
LV dias vol: 59 mL
LVSYSVOL: 11 mL
Rest HR: 78 {beats}/min
SRS: 1
TID: 0.96

## 2014-08-27 MED ORDER — REGADENOSON 0.4 MG/5ML IV SOLN
0.4000 mg | Freq: Once | INTRAVENOUS | Status: AC
  Administered 2014-08-27: 0.4 mg via INTRAVENOUS

## 2014-08-27 MED ORDER — TECHNETIUM TC 99M SESTAMIBI GENERIC - CARDIOLITE
30.4000 | Freq: Once | INTRAVENOUS | Status: AC | PRN
  Administered 2014-08-27: 30 via INTRAVENOUS

## 2014-08-27 MED ORDER — TECHNETIUM TC 99M SESTAMIBI GENERIC - CARDIOLITE
10.8000 | Freq: Once | INTRAVENOUS | Status: AC | PRN
  Administered 2014-08-27: 11 via INTRAVENOUS

## 2014-08-27 MED ORDER — AMINOPHYLLINE 25 MG/ML IV SOLN
75.0000 mg | Freq: Once | INTRAVENOUS | Status: AC
  Administered 2014-08-27: 75 mg via INTRAVENOUS

## 2014-08-29 ENCOUNTER — Telehealth: Payer: Self-pay | Admitting: *Deleted

## 2014-08-29 NOTE — Telephone Encounter (Signed)
Pt notified of myoview results by phone with verbal understanding 

## 2014-09-03 ENCOUNTER — Other Ambulatory Visit: Payer: Self-pay | Admitting: Family Medicine

## 2014-09-03 DIAGNOSIS — N6320 Unspecified lump in the left breast, unspecified quadrant: Secondary | ICD-10-CM

## 2014-09-11 ENCOUNTER — Other Ambulatory Visit: Payer: Medicare Other

## 2014-09-30 ENCOUNTER — Ambulatory Visit
Admission: RE | Admit: 2014-09-30 | Discharge: 2014-09-30 | Disposition: A | Discharge status: Home / Self Care | Payer: Medicare Other | Source: Ambulatory Visit | Attending: Family Medicine | Admitting: Family Medicine

## 2014-09-30 ENCOUNTER — Other Ambulatory Visit: Payer: Self-pay | Admitting: Family Medicine

## 2014-09-30 DIAGNOSIS — N6320 Unspecified lump in the left breast, unspecified quadrant: Secondary | ICD-10-CM

## 2015-02-27 ENCOUNTER — Ambulatory Visit (HOSPITAL_BASED_OUTPATIENT_CLINIC_OR_DEPARTMENT_OTHER): Payer: Medicare Other | Attending: Family Medicine

## 2015-02-27 VITALS — Ht 62.0 in | Wt 192.0 lb

## 2015-02-27 DIAGNOSIS — Z79899 Other long term (current) drug therapy: Secondary | ICD-10-CM | POA: Insufficient documentation

## 2015-02-27 DIAGNOSIS — R0683 Snoring: Secondary | ICD-10-CM | POA: Insufficient documentation

## 2015-02-27 DIAGNOSIS — Z6835 Body mass index (BMI) 35.0-35.9, adult: Secondary | ICD-10-CM | POA: Insufficient documentation

## 2015-02-27 DIAGNOSIS — E669 Obesity, unspecified: Secondary | ICD-10-CM | POA: Insufficient documentation

## 2015-02-27 DIAGNOSIS — I119 Hypertensive heart disease without heart failure: Secondary | ICD-10-CM | POA: Insufficient documentation

## 2015-02-27 DIAGNOSIS — G4733 Obstructive sleep apnea (adult) (pediatric): Secondary | ICD-10-CM | POA: Insufficient documentation

## 2015-03-02 DIAGNOSIS — G4733 Obstructive sleep apnea (adult) (pediatric): Secondary | ICD-10-CM | POA: Diagnosis not present

## 2015-03-02 NOTE — Progress Notes (Signed)
Patient Name: Nancy [name], [place] Date: 02/27/2015 Gender: Female D.O.B: 1940-06-09 Age (years): 74 Referring Provider: Cammie Fulp Height (inches): 62 Interpreting Physician: Baird Lyons MD, ABSM Weight (lbs): 192 RPSGT: Baxter Flattery BMI: 35 MRN: [id number] Neck Size: 15.50 CLINICAL INFORMATION Sleep Study Type: Split Night CPAP Indication for sleep study: Obesity, OSA, Snoring, Witnessed Apneas Epworth Sleepiness Score: 7  SLEEP STUDY TECHNIQUE As per the AASM Manual for the Scoring of Sleep and Associated Events v2.3 (April 2016) with a hypopnea requiring 4% desaturations. The channels recorded and monitored were frontal, central and occipital EEG, electrooculogram (EOG), submentalis EMG (chin), nasal and oral airflow, thoracic and abdominal wall motion, anterior tibialis EMG, snore microphone, electrocardiogram, and pulse oximetry. Continuous positive airway pressure (CPAP) was initiated when the patient met split night criteria and was titrated according to treat sleep-disordered breathing.  MEDICATIONS Medications taken by the patient : Charted for review Medications administered by patient during sleep study :Gabapentin  RESPIRATORY PARAMETERS Diagnostic Total AHI (/hr): 28.4 RDI (/hr): 28.4 OA Index (/hr): 8.3 CA Index (/hr): 0.0 REM AHI (/hr): 80.0 NREM AHI (/hr): 24.0 Supine AHI (/hr): 36.9 Non-supine AHI (/hr): 4.49 Min O2 Sat (%): 77.00 Mean O2 (%): 91.31 Time below 88% (min): 8.3   Titration Optimal Pressure (cm): 10 AHI at Optimal Pressure (/hr): 0.0 Min O2 at Optimal Pressure (%): 91.0 Supine % at Optimal (%): 0 Sleep % at Optimal (%): 52    SLEEP ARCHITECTURE The recording time for the entire night was 420.6 minutes. During a baseline period of 178.3 minutes, the patient slept for 152.3 minutes in REM and nonREM, yielding a sleep efficiency of 85.4%. Sleep onset after lights out was 16.0 minutes with a REM latency of 93.5 minutes. The patient spent  3.28% of the night in stage N1 sleep, 46.81% in stage N2 sleep, 42.03% in stage N3 and 7.88% in REM.   During the titration period of 234.0 minutes, the patient slept for 88.0 minutes in REM and nonREM, yielding a sleep efficiency of 37.6%. Sleep onset after CPAP initiation was 24.4 minutes with a REM latency of N/A minutes. The patient spent 17.05% of the night in stage N1 sleep, 67.61% in stage N2 sleep, 15.34% in stage N3 and 0.00% in REM.  CARDIAC DATA The 2 lead EKG demonstrated sinus rhythm. The mean heart rate was 65.84 beats per minute. Other EKG findings include: None.  LEG MOVEMENT DATA The total Periodic Limb Movements of Sleep (PLMS) were 86. The PLMS index was 21.46 .  IMPRESSIONS - Moderate obstructive sleep apnea occurred during the diagnostic portion of the study(AHI = 28.4/hour). An optimal PAP pressure was selected for this patient ( 10 cm of water) - No significant central sleep apnea occurred during the diagnostic portion of the study (CAI = 0.0/hour). - Moderate oxygen desaturation was noted during the diagnostic portion of the study (Min O2 =77.00%). - No snoring was audible during the diagnostic portion of the study. - No cardiac abnormalities were noted during this study. - Mild periodic limb movements of sleep occurred during the study.  DIAGNOSIS - Obstructive Sleep Apnea (327.23 [G47.33 ICD-10]) RECOMMENDATIONS - Trial of CPAP therapy on 10 cm H2O with a Medium size Fisher&Paykel Full Face Mask Simplus mask and heated humidification. - Avoid alcohol, sedatives and other CNS depressants that may worsen sleep apnea and disrupt normal sleep architecture. - Sleep hygiene should be reviewed to assess factors that may improve sleep quality. - Weight management and regular exercise should be initiated or  continued.  Deneise Lever Diplomate, American Board of Sleep Medicine  ELECTRONICALLY SIGNED ON:  03/02/2015, 4:56 PM [place] PH: [phone]  [phone]   FX: [phone] [place]

## 2015-05-05 ENCOUNTER — Other Ambulatory Visit: Payer: Self-pay | Admitting: Family Medicine

## 2015-05-05 ENCOUNTER — Ambulatory Visit
Admission: RE | Admit: 2015-05-05 | Discharge: 2015-05-05 | Disposition: A | Discharge status: Home / Self Care | Payer: Medicare Other | Source: Ambulatory Visit | Attending: Family Medicine | Admitting: Family Medicine

## 2015-05-05 DIAGNOSIS — M2550 Pain in unspecified joint: Secondary | ICD-10-CM

## 2015-05-08 ENCOUNTER — Other Ambulatory Visit: Payer: Self-pay | Admitting: Family Medicine

## 2015-05-08 ENCOUNTER — Ambulatory Visit
Admission: RE | Admit: 2015-05-08 | Discharge: 2015-05-08 | Disposition: A | Discharge status: Home / Self Care | Payer: Medicare Other | Source: Ambulatory Visit | Attending: Family Medicine | Admitting: Family Medicine

## 2015-05-08 DIAGNOSIS — M2550 Pain in unspecified joint: Secondary | ICD-10-CM

## 2015-07-04 ENCOUNTER — Other Ambulatory Visit: Payer: Self-pay | Admitting: Orthopedic Surgery

## 2015-07-11 ENCOUNTER — Encounter: Payer: Self-pay | Admitting: Podiatry

## 2015-07-11 ENCOUNTER — Ambulatory Visit (INDEPENDENT_AMBULATORY_CARE_PROVIDER_SITE_OTHER): Payer: Medicare Other | Admitting: Podiatry

## 2015-07-11 ENCOUNTER — Ambulatory Visit (INDEPENDENT_AMBULATORY_CARE_PROVIDER_SITE_OTHER): Payer: Medicare Other

## 2015-07-11 VITALS — BP 140/82 | HR 92 | Resp 12

## 2015-07-11 DIAGNOSIS — M79673 Pain in unspecified foot: Secondary | ICD-10-CM

## 2015-07-11 MED ORDER — NONFORMULARY OR COMPOUNDED ITEM: Status: DC

## 2015-07-11 NOTE — Progress Notes (Signed)
   Subjective:    Patient ID: Nancy [name], female    DOB: [birth year], 75 y.o.   MRN: [id number]  HPI  75 year old female presents the office today for concerns of pain to the ball of her feet on the forefoot which is been ongoing for quite some time greater than 1 year. She states the pain is worse at night. She denies any recent injury or trauma. She is on gabapentin. Denies any swelling or redness. She's had no previous treatment. No other complaints at this time.  Review of Systems  Musculoskeletal: Positive for back pain and gait problem.  Skin: Positive for color change.       Objective:   Physical Exam General: AAO x3, NAD  Dermatological: There are no open sores, no preulcerative lesions, no rash or signs of infection present.  Vascular: Dorsalis Pedis artery and Posterior Tibial artery pedal pulses are palpable bilateral with immedate capillary fill time. There is no pain with calf compression, swelling, warmth, erythema.   Neruologic: Vibratory sensation decreased. Sensation with Simms 1 2 monofilament. We intact.  Musculoskeletal: There is prominence the metatarsal heads plantarly with atrophy of the fat pad. There is no specific area pinpoint bony tenderness there is no pain vibratory sensation. No pain, crepitus, or limitation noted with foot and ankle range of motion bilateral. Muscular strength 5/5 in all groups tested bilateral.  Gait: Unassisted, Nonantalgic.       Assessment & Plan:  75 year old female bilateral forefoot pain, neuropathy, metatarsalgia -Treatment options discussed including all alternatives, risks, and complications -Etiology of symptoms were discussed -X-rays were obtained and reviewed with the patient.  -Ordered compound cream to help with her symptoms as well. -Offloading pads. -Discussed shoe gear modifications -Discussed the treatment options for neuropathy. Will start with a cream. -Follow-up as scheduled or sooner if any issues are  to arise. Encouraged to call any questions, concerns or any change in symptoms.  Celesta Gentile, DPM

## 2015-07-11 NOTE — Patient Instructions (Signed)

## 2015-07-18 ENCOUNTER — Encounter (HOSPITAL_BASED_OUTPATIENT_CLINIC_OR_DEPARTMENT_OTHER): Payer: Self-pay | Admitting: *Deleted

## 2015-07-22 ENCOUNTER — Encounter (HOSPITAL_BASED_OUTPATIENT_CLINIC_OR_DEPARTMENT_OTHER)
Admission: RE | Admit: 2015-07-22 | Discharge: 2015-07-22 | Disposition: A | Discharge status: Home / Self Care | Payer: Medicare Other | Source: Ambulatory Visit | Attending: Orthopedic Surgery | Admitting: Orthopedic Surgery

## 2015-07-22 DIAGNOSIS — K219 Gastro-esophageal reflux disease without esophagitis: Secondary | ICD-10-CM | POA: Diagnosis not present

## 2015-07-22 DIAGNOSIS — E119 Type 2 diabetes mellitus without complications: Secondary | ICD-10-CM | POA: Diagnosis not present

## 2015-07-22 DIAGNOSIS — Z7984 Long term (current) use of oral hypoglycemic drugs: Secondary | ICD-10-CM | POA: Diagnosis not present

## 2015-07-22 DIAGNOSIS — M1990 Unspecified osteoarthritis, unspecified site: Secondary | ICD-10-CM | POA: Diagnosis not present

## 2015-07-22 DIAGNOSIS — E669 Obesity, unspecified: Secondary | ICD-10-CM | POA: Diagnosis not present

## 2015-07-22 DIAGNOSIS — Z6833 Body mass index (BMI) 33.0-33.9, adult: Secondary | ICD-10-CM | POA: Diagnosis not present

## 2015-07-22 DIAGNOSIS — Z794 Long term (current) use of insulin: Secondary | ICD-10-CM | POA: Diagnosis not present

## 2015-07-22 DIAGNOSIS — I10 Essential (primary) hypertension: Secondary | ICD-10-CM | POA: Diagnosis not present

## 2015-07-22 DIAGNOSIS — I5032 Chronic diastolic (congestive) heart failure: Secondary | ICD-10-CM | POA: Diagnosis not present

## 2015-07-22 DIAGNOSIS — M67442 Ganglion, left hand: Secondary | ICD-10-CM | POA: Diagnosis not present

## 2015-07-22 DIAGNOSIS — F419 Anxiety disorder, unspecified: Secondary | ICD-10-CM | POA: Diagnosis not present

## 2015-07-22 DIAGNOSIS — E785 Hyperlipidemia, unspecified: Secondary | ICD-10-CM | POA: Diagnosis not present

## 2015-07-22 DIAGNOSIS — M6748 Ganglion, other site: Secondary | ICD-10-CM | POA: Diagnosis present

## 2015-07-22 DIAGNOSIS — Z7982 Long term (current) use of aspirin: Secondary | ICD-10-CM | POA: Diagnosis not present

## 2015-07-22 LAB — BASIC METABOLIC PANEL
Anion gap: 7 (ref 5–15)
BUN: 9 mg/dL (ref 6–20)
CALCIUM: 9.5 mg/dL (ref 8.9–10.3)
CO2: 29 mmol/L (ref 22–32)
CREATININE: 0.88 mg/dL (ref 0.44–1.00)
Chloride: 103 mmol/L (ref 101–111)
GFR calc non Af Amer: >60 mL/min (ref >60)
GLUCOSE: 136 mg/dL — AB (ref 65–99)
Potassium: 4.9 mmol/L (ref 3.5–5.1)
Sodium: 139 mmol/L (ref 135–145)

## 2015-07-24 ENCOUNTER — Ambulatory Visit (HOSPITAL_BASED_OUTPATIENT_CLINIC_OR_DEPARTMENT_OTHER): Payer: Medicare Other | Admitting: Certified Registered"

## 2015-07-24 ENCOUNTER — Encounter (HOSPITAL_BASED_OUTPATIENT_CLINIC_OR_DEPARTMENT_OTHER)
Admission: RE | Disposition: A | Discharge status: Home / Self Care | Payer: Self-pay | Source: Ambulatory Visit | Attending: Orthopedic Surgery

## 2015-07-24 ENCOUNTER — Encounter (HOSPITAL_BASED_OUTPATIENT_CLINIC_OR_DEPARTMENT_OTHER): Payer: Self-pay

## 2015-07-24 ENCOUNTER — Ambulatory Visit (HOSPITAL_BASED_OUTPATIENT_CLINIC_OR_DEPARTMENT_OTHER)
Admission: RE | Admit: 2015-07-24 | Discharge: 2015-07-24 | Disposition: A | Discharge status: Home / Self Care | Payer: Medicare Other | Source: Ambulatory Visit | Attending: Orthopedic Surgery | Admitting: Orthopedic Surgery

## 2015-07-24 DIAGNOSIS — F419 Anxiety disorder, unspecified: Secondary | ICD-10-CM | POA: Insufficient documentation

## 2015-07-24 DIAGNOSIS — E785 Hyperlipidemia, unspecified: Secondary | ICD-10-CM | POA: Insufficient documentation

## 2015-07-24 DIAGNOSIS — M67442 Ganglion, left hand: Secondary | ICD-10-CM | POA: Insufficient documentation

## 2015-07-24 DIAGNOSIS — I10 Essential (primary) hypertension: Secondary | ICD-10-CM | POA: Insufficient documentation

## 2015-07-24 DIAGNOSIS — Z794 Long term (current) use of insulin: Secondary | ICD-10-CM | POA: Insufficient documentation

## 2015-07-24 DIAGNOSIS — Z6833 Body mass index (BMI) 33.0-33.9, adult: Secondary | ICD-10-CM | POA: Insufficient documentation

## 2015-07-24 DIAGNOSIS — K219 Gastro-esophageal reflux disease without esophagitis: Secondary | ICD-10-CM | POA: Diagnosis not present

## 2015-07-24 DIAGNOSIS — Z7982 Long term (current) use of aspirin: Secondary | ICD-10-CM | POA: Insufficient documentation

## 2015-07-24 DIAGNOSIS — E119 Type 2 diabetes mellitus without complications: Secondary | ICD-10-CM | POA: Insufficient documentation

## 2015-07-24 DIAGNOSIS — E669 Obesity, unspecified: Secondary | ICD-10-CM | POA: Insufficient documentation

## 2015-07-24 DIAGNOSIS — M1990 Unspecified osteoarthritis, unspecified site: Secondary | ICD-10-CM | POA: Insufficient documentation

## 2015-07-24 DIAGNOSIS — Z7984 Long term (current) use of oral hypoglycemic drugs: Secondary | ICD-10-CM | POA: Insufficient documentation

## 2015-07-24 DIAGNOSIS — I5032 Chronic diastolic (congestive) heart failure: Secondary | ICD-10-CM | POA: Insufficient documentation

## 2015-07-24 HISTORY — PX: MASS EXCISION: SHX2000

## 2015-07-24 LAB — GLUCOSE, CAPILLARY
Glucose-Capillary: 142 mg/dL — ABNORMAL HIGH (ref 65–99)
Glucose-Capillary: 146 mg/dL — ABNORMAL HIGH (ref 65–99)

## 2015-07-24 SURGERY — EXCISION MASS
Anesthesia: Monitor Anesthesia Care | Site: Finger | Laterality: Left

## 2015-07-24 MED ORDER — LACTATED RINGERS IV SOLN
INTRAVENOUS | Status: DC
  Administered 2015-07-24: 08:00:00 via INTRAVENOUS

## 2015-07-24 MED ORDER — SCOPOLAMINE 1 MG/3DAYS TD PT72: 1.0000 | MEDICATED_PATCH | Freq: Once | TRANSDERMAL | Status: DC | PRN

## 2015-07-24 MED ORDER — HYDROCODONE-ACETAMINOPHEN 5-325 MG PO TABS: ORAL_TABLET | ORAL | Status: DC

## 2015-07-24 MED ORDER — ONDANSETRON HCL 4 MG/2ML IJ SOLN
INTRAMUSCULAR | Status: AC
  Filled 2015-07-24: qty 2

## 2015-07-24 MED ORDER — FENTANYL CITRATE (PF) 100 MCG/2ML IJ SOLN: 25.0000 ug | INTRAMUSCULAR | Status: DC | PRN

## 2015-07-24 MED ORDER — OXYCODONE HCL 5 MG PO TABS: 5.0000 mg | ORAL_TABLET | Freq: Once | ORAL | Status: DC | PRN

## 2015-07-24 MED ORDER — ONDANSETRON HCL 4 MG/2ML IJ SOLN: 4.0000 mg | Freq: Four times a day (QID) | INTRAMUSCULAR | Status: DC | PRN

## 2015-07-24 MED ORDER — GLYCOPYRROLATE 0.2 MG/ML IJ SOLN: 0.2000 mg | Freq: Once | INTRAMUSCULAR | Status: DC | PRN

## 2015-07-24 MED ORDER — OXYCODONE HCL 5 MG/5ML PO SOLN
5.0000 mg | Freq: Once | ORAL | Status: DC | PRN
Start: 2015-07-24 — End: 2015-07-24

## 2015-07-24 MED ORDER — BUPIVACAINE HCL (PF) 0.25 % IJ SOLN
INTRAMUSCULAR | Status: AC
  Filled 2015-07-24: qty 30

## 2015-07-24 MED ORDER — ONDANSETRON HCL 4 MG/2ML IJ SOLN
INTRAMUSCULAR | Status: DC | PRN
  Administered 2015-07-24: 4 mg via INTRAVENOUS

## 2015-07-24 MED ORDER — FENTANYL CITRATE (PF) 100 MCG/2ML IJ SOLN
INTRAMUSCULAR | Status: AC
  Filled 2015-07-24: qty 2

## 2015-07-24 MED ORDER — PROPOFOL 500 MG/50ML IV EMUL
INTRAVENOUS | Status: DC | PRN
  Administered 2015-07-24: 75 ug/kg/min via INTRAVENOUS

## 2015-07-24 MED ORDER — BUPIVACAINE HCL (PF) 0.25 % IJ SOLN
INTRAMUSCULAR | Status: DC | PRN
  Administered 2015-07-24: 5 mL

## 2015-07-24 MED ORDER — CEFAZOLIN SODIUM-DEXTROSE 2-4 GM/100ML-% IV SOLN
2.0000 g | INTRAVENOUS | Status: AC
  Administered 2015-07-24: 2 g via INTRAVENOUS

## 2015-07-24 MED ORDER — CEFAZOLIN SODIUM-DEXTROSE 2-4 GM/100ML-% IV SOLN
INTRAVENOUS | Status: AC
Start: 2015-07-24 — End: 2015-07-24
  Filled 2015-07-24: qty 100

## 2015-07-24 MED ORDER — MIDAZOLAM HCL 2 MG/2ML IJ SOLN: 1.0000 mg | INTRAMUSCULAR | Status: DC | PRN

## 2015-07-24 MED ORDER — LIDOCAINE HCL (CARDIAC) 20 MG/ML IV SOLN
INTRAVENOUS | Status: DC | PRN
  Administered 2015-07-24: 50 mg via INTRAVENOUS

## 2015-07-24 MED ORDER — CHLORHEXIDINE GLUCONATE 4 % EX LIQD: 60.0000 mL | Freq: Once | CUTANEOUS | Status: DC

## 2015-07-24 MED ORDER — FENTANYL CITRATE (PF) 100 MCG/2ML IJ SOLN
50.0000 ug | INTRAMUSCULAR | Status: DC | PRN
  Administered 2015-07-24: 50 ug via INTRAVENOUS
  Administered 2015-07-24: 25 ug via INTRAVENOUS

## 2015-07-24 SURGICAL SUPPLY — 57 items
APL SKNCLS STERI-STRIP NONHPOA (GAUZE/BANDAGES/DRESSINGS)
BANDAGE ACE 3X5.8 VEL STRL LF (GAUZE/BANDAGES/DRESSINGS) IMPLANT
BANDAGE COBAN STERILE 2 (GAUZE/BANDAGES/DRESSINGS) IMPLANT
BENZOIN TINCTURE PRP APPL 2/3 (GAUZE/BANDAGES/DRESSINGS) IMPLANT
BLADE MINI RND TIP GREEN BEAV (BLADE) ×2 IMPLANT
BLADE SURG 15 STRL LF DISP TIS (BLADE) ×2 IMPLANT
BLADE SURG 15 STRL SS (BLADE) ×6
BNDG CMPR 9X4 STRL LF SNTH (GAUZE/BANDAGES/DRESSINGS)
BNDG COHESIVE 1X5 TAN STRL LF (GAUZE/BANDAGES/DRESSINGS) ×2 IMPLANT
BNDG CONFORM 2 STRL LF (GAUZE/BANDAGES/DRESSINGS) IMPLANT
BNDG ELASTIC 2X5.8 VLCR STR LF (GAUZE/BANDAGES/DRESSINGS) IMPLANT
BNDG ESMARK 4X9 LF (GAUZE/BANDAGES/DRESSINGS) IMPLANT
BNDG GAUZE 1X2.1 STRL (MISCELLANEOUS) IMPLANT
BNDG GAUZE ELAST 4 BULKY (GAUZE/BANDAGES/DRESSINGS) IMPLANT
BNDG PLASTER X FAST 3X3 WHT LF (CAST SUPPLIES) IMPLANT
BNDG PLSTR 9X3 FST ST WHT (CAST SUPPLIES)
CHLORAPREP W/TINT 26ML (MISCELLANEOUS) ×3 IMPLANT
CLOSURE WOUND 1/2 X4 (GAUZE/BANDAGES/DRESSINGS)
CORDS BIPOLAR (ELECTRODE) ×3 IMPLANT
COVER BACK TABLE 60X90IN (DRAPES) ×3 IMPLANT
COVER MAYO STAND STRL (DRAPES) ×3 IMPLANT
CUFF TOURNIQUET SINGLE 18IN (TOURNIQUET CUFF) ×3 IMPLANT
DRAPE EXTREMITY T 121X128X90 (DRAPE) ×3 IMPLANT
DRAPE SURG 17X23 STRL (DRAPES) ×3 IMPLANT
GAUZE SPONGE 4X4 12PLY STRL (GAUZE/BANDAGES/DRESSINGS) ×3 IMPLANT
GAUZE XEROFORM 1X8 LF (GAUZE/BANDAGES/DRESSINGS) ×3 IMPLANT
GLOVE BIO SURGEON STRL SZ 6.5 (GLOVE) ×1 IMPLANT
GLOVE BIO SURGEON STRL SZ7.5 (GLOVE) ×3 IMPLANT
GLOVE BIO SURGEONS STRL SZ 6.5 (GLOVE) ×1
GLOVE BIOGEL PI IND STRL 7.0 (GLOVE) IMPLANT
GLOVE BIOGEL PI IND STRL 8 (GLOVE) ×1 IMPLANT
GLOVE BIOGEL PI INDICATOR 7.0 (GLOVE) ×4
GLOVE BIOGEL PI INDICATOR 8 (GLOVE) ×2
GOWN STRL REUS W/ TWL LRG LVL3 (GOWN DISPOSABLE) ×1 IMPLANT
GOWN STRL REUS W/TWL LRG LVL3 (GOWN DISPOSABLE) ×3
GOWN STRL REUS W/TWL XL LVL3 (GOWN DISPOSABLE) ×3 IMPLANT
NDL HYPO 25X1 1.5 SAFETY (NEEDLE) ×1 IMPLANT
NEEDLE HYPO 25X1 1.5 SAFETY (NEEDLE) ×3 IMPLANT
NS IRRIG 1000ML POUR BTL (IV SOLUTION) ×3 IMPLANT
PACK BASIN DAY SURGERY FS (CUSTOM PROCEDURE TRAY) ×3 IMPLANT
PAD CAST 3X4 CTTN HI CHSV (CAST SUPPLIES) IMPLANT
PAD CAST 4YDX4 CTTN HI CHSV (CAST SUPPLIES) IMPLANT
PADDING CAST ABS 4INX4YD NS (CAST SUPPLIES) ×2
PADDING CAST ABS COTTON 4X4 ST (CAST SUPPLIES) ×1 IMPLANT
PADDING CAST COTTON 3X4 STRL (CAST SUPPLIES)
PADDING CAST COTTON 4X4 STRL (CAST SUPPLIES)
STOCKINETTE 4X48 STRL (DRAPES) ×3 IMPLANT
STRIP CLOSURE SKIN 1/2X4 (GAUZE/BANDAGES/DRESSINGS) IMPLANT
SUT ETHILON 3 0 PS 1 (SUTURE) IMPLANT
SUT ETHILON 4 0 PS 2 18 (SUTURE) ×3 IMPLANT
SUT ETHILON 5 0 P 3 18 (SUTURE)
SUT NYLON ETHILON 5-0 P-3 1X18 (SUTURE) IMPLANT
SUT VIC AB 4-0 P2 18 (SUTURE) IMPLANT
SYR BULB 3OZ (MISCELLANEOUS) ×3 IMPLANT
SYR CONTROL 10ML LL (SYRINGE) ×3 IMPLANT
TOWEL OR 17X24 6PK STRL BLUE (TOWEL DISPOSABLE) ×4 IMPLANT
UNDERPAD 30X30 (UNDERPADS AND DIAPERS) ×3 IMPLANT

## 2015-07-24 NOTE — Discharge Instructions (Addendum)
[facility] Instructions °Hand Surgery ° °Wound Care: °Keep your hand elevated above the level of your heart.  Do not allow it to dangle by your side.  Keep the dressing dry and do not remove it unless your doctor advises you to do so.  He will usually change it at the time of your post-op visit.  Moving your fingers is advised to stimulate circulation but will depend on the site of your surgery.  If you have a splint applied, your doctor will advise you regarding movement. ° °Activity: °Do not drive or operate machinery today.  Rest today and then you may return to your normal activity and work as indicated by your physician. ° °Diet:  °Drink liquids today or eat a light diet.  You may resume a regular diet tomorrow.   ° °General expectations: °Pain for two to three days. °Fingers may become slightly swollen. ° °Call your doctor if any of the following occur: °Severe pain not relieved by pain medication. °Elevated temperature. °Dressing soaked with blood. °Inability to move fingers. °White or bluish color to fingers. ° ° °Post Anesthesia Home Care Instructions ° °Activity: °Get plenty of rest for the remainder of the day. A responsible adult should stay with you for 24 hours following the procedure.  °For the next 24 hours, DO NOT: °-Drive a car °-Operate machinery °-Drink alcoholic beverages °-Take any medication unless instructed by your physician °-Make any legal decisions or sign important papers. ° °Meals: °Start with liquid foods such as gelatin or soup. Progress to regular foods as tolerated. Avoid greasy, spicy, heavy foods. If nausea and/or vomiting occur, drink only clear liquids until the nausea and/or vomiting subsides. Call your physician if vomiting continues. ° °Special Instructions/Symptoms: °Your throat may feel dry or sore from the anesthesia or the breathing tube placed in your throat during surgery. If this causes discomfort, gargle with warm salt water. The discomfort should disappear within 24  hours. ° °If you had a scopolamine patch placed behind your ear for the management of post- operative nausea and/or vomiting: ° °1. The medication in the patch is effective for 72 hours, after which it should be removed.  Wrap patch in a tissue and discard in the trash. Wash hands thoroughly with soap and water. °2. You may remove the patch earlier than 72 hours if you experience unpleasant side effects which may include dry mouth, dizziness or visual disturbances. °3. Avoid touching the patch. Wash your hands with soap and water after contact with the patch. °  ° ° °Post Anesthesia Home Care Instructions ° °Activity: °Get plenty of rest for the remainder of the day. A responsible adult should stay with you for 24 hours following the procedure.  °For the next 24 hours, DO NOT: °-Drive a car °-Operate machinery °-Drink alcoholic beverages °-Take any medication unless instructed by your physician °-Make any legal decisions or sign important papers. ° °Meals: °Start with liquid foods such as gelatin or soup. Progress to regular foods as tolerated. Avoid greasy, spicy, heavy foods. If nausea and/or vomiting occur, drink only clear liquids until the nausea and/or vomiting subsides. Call your physician if vomiting continues. ° °Special Instructions/Symptoms: °Your throat may feel dry or sore from the anesthesia or the breathing tube placed in your throat during surgery. If this causes discomfort, gargle with warm salt water. The discomfort should disappear within 24 hours. ° °If you had a scopolamine patch placed behind your ear for the management of post- operative nausea and/or vomiting: ° °  1. The medication in the patch is effective for 72 hours, after which it should be removed.  Wrap patch in a tissue and discard in the trash. Wash hands thoroughly with soap and water. °2. You may remove the patch earlier than 72 hours if you experience unpleasant side effects which may include dry mouth, dizziness or visual  disturbances. °3. Avoid touching the patch. Wash your hands with soap and water after contact with the patch. °  ° °

## 2015-07-24 NOTE — H&P (Signed)
Nancy [name] is an 75 y.o. female.   Chief Complaint: left long finger cyst HPI: 75 yo female with mass of left long finger.  It is painful.  She wishes to have it removed.  Allergies:  Allergies  Allergen Reactions  . Codeine Other (See Comments)    headache    Past Medical History  Diagnosis Date  . Gout   . Type II diabetes mellitus ([place])   . Chest pain   . Osteopenia   . Gastric ulcer   . Hyperlipidemia   . Osteoarthritis   . Breast cyst     left breast  . Hypertension   . Diastolic dysfunction     with mild pulmonary HTN by echo 07/2011  . GERD (gastroesophageal reflux disease)   . Spine deformity     lower spine  . Complication of anesthesia 1986    slow to awaken after cholecystectomy  . Anxiety     Past Surgical History  Procedure Laterality Date  . Abdominal hysterectomy    . Abdominal adhesion surgery    . Breast lumpectomy      left  . Appendectomy    . Anus surgery      for torn tissue  . Breast biopsy    . Cholecystectomy  1986  . Esophagogastroduodenoscopy (egd) with propofol N/A 07/18/2013    Procedure: ESOPHAGOGASTRODUODENOSCOPY (EGD) WITH PROPOFOL;  Surgeon: Arta Silence, MD;  Location: [facility];  Service: Endoscopy;  Laterality: N/A;  . Eus N/A 07/18/2013    Procedure: ESOPHAGEAL ENDOSCOPIC ULTRASOUND (EUS) RADIAL;  Surgeon: Arta Silence, MD;  Location: [facility];  Service: Endoscopy;  Laterality: N/A;    Family History: Family History  Problem Relation Age of Onset  . Heart disease Mother   . Cancer Sister     ovarian    Social History:   reports that she has never smoked. She has never used smokeless tobacco. She reports that she drinks alcohol. She reports that she does not use illicit drugs.  Medications: Medications Prior to Admission  Medication Sig Dispense Refill  . allopurinol (ZYLOPRIM) 100 MG tablet Take 200 mg by mouth daily.     [name] amLODipine (NORVASC) 10 MG tablet Take 10 mg by mouth every morning.     [name]  aspirin EC 81 MG tablet Take 81 mg by mouth daily.    [name] dexlansoprazole (DEXILANT) 60 MG capsule Take 60 mg by mouth daily.    . furosemide (LASIX) 20 MG tablet TAKE ONE TABLET BY MOUTH ONCE DAILY 30 tablet 10  . gabapentin (NEURONTIN) 300 MG capsule Take 300 mg by mouth 3 (three) times daily.     [name] glipiZIDE (GLUCOTROL) 5 MG tablet Take 5 mg by mouth daily before breakfast.    . HYDROcodone-acetaminophen (NORCO/VICODIN) 5-325 MG per tablet Take 1 tablet by mouth as needed.    [name] lisinopril (PRINIVIL,ZESTRIL) 40 MG tablet Take 40 mg by mouth every morning.     . meloxicam (MOBIC) 7.5 MG tablet Take 7.5 mg by mouth daily.    . metFORMIN (GLUCOPHAGE) 1000 MG tablet Take 1,000 mg by mouth 2 (two) times daily with a meal.      . metoprolol (LOPRESSOR) 100 MG tablet Take 100 mg by mouth every morning.     . NONFORMULARY OR COMPOUNDED [place] compound:  Peripheral Neuropathy cream - Bupivacaine 1%, doxepin 3%, Gabapentin 6%, Pentoxifylline 3%, Topiramate 1%, apply 1-2 grams to affected area 3-4 times daily. 120 each 2  . Polyethyl  Glycol-Propyl Glycol (SYSTANE OP) Apply 1 drop to eye as needed (Dry eyes).    . simvastatin (ZOCOR) 20 MG tablet Take 20 mg by mouth every evening.     . AIMSCO INSULIN SYR ULTRA THIN 31G X 5/16" 0.3 ML MISC     . colchicine (COLCRYS) 0.6 MG tablet Take 0.6 mg by mouth daily as needed (Gout).     . diazepam (VALIUM) 5 MG tablet Take 2.5-5 mg by mouth at bedtime as needed for muscle spasms (Take 1/2 tablet or 1 tablet as needed at bedtime).     . nystatin cream (MYCOSTATIN) Apply 1 application topically 2 (two) times daily.    . ONE TOUCH ULTRA TEST test strip     . predniSONE (DELTASONE) 20 MG tablet Take 20 mg by mouth as needed (for swelling).     . traZODone (DESYREL) 50 MG tablet Take 50-100 mg by mouth at bedtime. Take 1 to 2 tablets at bedtime to help with sleep      Results for orders placed or performed during the hospital encounter of 07/24/15 (from  the past 48 hour(s))  Basic metabolic panel     Status: Abnormal   Collection Time: 07/22/15 10:40 AM  Result Value Ref Range   Sodium 139 135 - 145 mmol/L   Potassium 4.9 3.5 - 5.1 mmol/L   Chloride 103 101 - 111 mmol/L   CO2 29 22 - 32 mmol/L   Glucose, Bld 136 (H) 65 - 99 mg/dL   BUN 9 6 - 20 mg/dL   Creatinine, Ser 0.88 0.44 - 1.00 mg/dL   Calcium 9.5 8.9 - 10.3 mg/dL   GFR calc non Af Amer >60 >60 mL/min   GFR calc Af Amer >60 >60 mL/min    Comment: (NOTE) The eGFR has been calculated using the CKD EPI equation. This calculation has not been validated in all clinical situations. eGFR's persistently <60 mL/min signify possible Chronic Kidney Disease.    Anion gap 7 5 - 15  Glucose, capillary     Status: Abnormal   Collection Time: 07/24/15  8:33 AM  Result Value Ref Range   Glucose-Capillary 142 (H) 65 - 99 mg/dL    No results found.   A comprehensive review of systems was negative except for: Constitutional: positive for sweats Neurological: positive for dizziness  Blood pressure 140/66, pulse 65, temperature 98.2 F (36.8 C), temperature source Oral, resp. rate 18, height 5' 1.5" (1.562 m), weight 81.874 kg (180 lb 8 oz), SpO2 97 %.  General appearance: alert, cooperative and appears stated age Head: Normocephalic, without obvious abnormality, atraumatic Neck: supple, symmetrical, trachea midline Resp: clear to auscultation bilaterally Cardio: regular rate and rhythm GI: non-tender Extremities: Intact sensation and capillary refill all digits.  +epl/fpl/io.  No wounds.  Pulses: 2+ and symmetric Skin: Skin color, texture, turgor normal. No rashes or lesions Neurologic: Grossly normal Incision/Wound:none  Assessment/Plan Left long finger annular ligament cyst.  Non operative and operative treatment options were discussed with the patient and patient wishes to proceed with operative treatment. Risks, benefits, and alternatives of surgery were discussed and the  patient agrees with the plan of care.   KUZMA,KEVIN R 07/24/2015, 8:35 AM   

## 2015-07-24 NOTE — Brief Op Note (Signed)
07/24/2015  10:12 AM  PATIENT:  Nancy [name]  75 y.o. female  PRE-OPERATIVE DIAGNOSIS:  LEFT LONG FINGER ANNULAR LIGAMENT CYST  POST-OPERATIVE DIAGNOSIS:  LEFT LONG FINGER ANNULAR LIGAMENT CYST  PROCEDURE:  Procedure(s): EXCISION MASS LEFT LONG FINGER (Left)  SURGEON:  Surgeon(s) and Role:    * Leanora Cover, MD - Primary  PHYSICIAN ASSISTANT:   ASSISTANTS: none   ANESTHESIA:   Bier block with sedation  EBL:     BLOOD ADMINISTERED:none  DRAINS: none   LOCAL MEDICATIONS USED:  MARCAINE     SPECIMEN:  Source of Specimen:  left long finger  DISPOSITION OF SPECIMEN:  PATHOLOGY  COUNTS:  YES  TOURNIQUET:   Total Tourniquet Time Documented: Forearm (Left) - 25 minutes Total: Forearm (Left) - 25 minutes   DICTATION: .Other Dictation: Dictation Number [id number]  PLAN OF CARE: Discharge to home after PACU  PATIENT DISPOSITION:  PACU - hemodynamically stable.

## 2015-07-24 NOTE — Anesthesia Preprocedure Evaluation (Signed)
Anesthesia Evaluation  Patient identified by MRN, date of birth, ID band Patient awake    Reviewed: Allergy & Precautions, NPO status , Patient's Chart, lab work & pertinent test results  Airway Mallampati: II   Neck ROM: full    Dental   Pulmonary neg pulmonary ROS,    breath sounds clear to auscultation       Cardiovascular hypertension,  Rhythm:regular Rate:Normal     Neuro/Psych Anxiety    GI/Hepatic PUD, GERD  ,  Endo/Other  diabetes, Type 2obese  Renal/GU      Musculoskeletal  (+) Arthritis ,   Abdominal   Peds  Hematology   Anesthesia Other Findings   Reproductive/Obstetrics                             Anesthesia Physical Anesthesia Plan  ASA: III  Anesthesia Plan: MAC   Post-op Pain Management:    Induction: Intravenous  Airway Management Planned: Simple Face Mask  Additional Equipment:   Intra-op Plan:   Post-operative Plan:   Informed Consent: I have reviewed the patients History and Physical, chart, labs and discussed the procedure including the risks, benefits and alternatives for the proposed anesthesia with the patient or authorized representative who has indicated his/her understanding and acceptance.     Plan Discussed with: CRNA, Anesthesiologist and Surgeon  Anesthesia Plan Comments:         Anesthesia Quick Evaluation

## 2015-07-24 NOTE — Transfer of Care (Signed)
Immediate Anesthesia Transfer of Care Note  Patient: Nancy [name]  Procedure(s) Performed: Procedure(s): EXCISION MASS LEFT LONG FINGER (Left)  Patient Location: PACU  Anesthesia Type:Bier block  Level of Consciousness: awake, sedated and patient cooperative  Airway & Oxygen Therapy: Patient Spontanous Breathing and Patient connected to face mask oxygen  Post-op Assessment: Report given to RN and Post -op Vital signs reviewed and stable  Post vital signs: Reviewed and stable  Last Vitals:  Filed Vitals:   07/24/15 0805  BP: 140/66  Pulse: 65  Temp: 36.8 C  Resp: 18    Last Pain: There were no vitals filed for this visit.       Complications: No apparent anesthesia complications

## 2015-07-24 NOTE — Op Note (Signed)
[id number] 

## 2015-07-24 NOTE — Anesthesia Postprocedure Evaluation (Signed)
Anesthesia Post Note  Patient: Nancy [name]  Procedure(s) Performed: Procedure(s) (LRB): EXCISION MASS LEFT LONG FINGER (Left)  Patient location during evaluation: PACU Anesthesia Type: MAC and Bier Block Level of consciousness: awake and alert Pain management: pain level controlled Vital Signs Assessment: post-procedure vital signs reviewed and stable Respiratory status: spontaneous breathing, nonlabored ventilation, respiratory function stable and patient connected to nasal cannula oxygen Cardiovascular status: stable and blood pressure returned to baseline Anesthetic complications: no    Last Vitals:  Filed Vitals:   07/24/15 1100 07/24/15 1110  BP: 129/68 133/72  Pulse: 64 64  Temp:    Resp: 14 13    Last Pain:  Filed Vitals:   07/24/15 1113  PainSc: 0-No pain                 Dierdre Mccalip S

## 2015-07-25 NOTE — Op Note (Deleted)
Nancy [name], Nancy [name]           ACCOUNT NO.:  [id number]  MEDICAL RECORD NO.:  [id number]  LOCATION:                               FACILITY:  [facility]  PHYSICIAN:  Leanora Cover, MD        DATE OF BIRTH:  04-03-1940  DATE OF PROCEDURE:  07/24/2015 DATE OF DISCHARGE:                              OPERATIVE REPORT   PREOPERATIVE DIAGNOSIS:  Left long finger annular ligament cyst.  POSTOPERATIVE DIAGNOSIS:  Left long finger annular ligament cyst.  PROCEDURE:  Excision of left long finger annular ligament cyst.  SURGEON:  Leanora Cover, MD.  ASSISTANT:  None.  ANESTHESIA:  Bier block with sedation.  IV FLUIDS:  Per anesthesia flow sheet.  ESTIMATED BLOOD LOSS:  Minimal.  COMPLICATIONS:  None.  SPECIMEN:  Cyst to Pathology.  TOURNIQUET TIME:  25 minutes.  DISPOSITION:  Stable to PACU.  INDICATIONS:  [name] is a 75 year old female, who has noted a mass in the left long finger.  It is bothersome to her.  She wished to have it excised.  Risks, benefits, and alternatives to surgery were discussed including risk of blood loss, infection, damage to nerves, vessels, tendons, ligaments, bone, failure of surgery, need for additional surgery, complications with wound healing, continued pain, and recurrence of mass.  The patient voiced understanding of risks and elected to proceed.  OPERATIVE COURSE:  After being identified preoperatively by myself, the patient and I agreed upon procedure and site of procedure.  The surgical site was marked.  The risks, benefits, and alternatives of surgery were reviewed.  She wished to proceed.  Surgical consent had been signed. She was given IV Ancef as preoperative antibiotic prophylaxis.  She was transferred to the operating room and placed on the operating room table in supine position with the left upper extremity on an armboard.  Bier block anesthesia was induced by anesthesiologist.  The left upper extremity was prepped and draped in  normal sterile orthopedic fashion. Surgical pause was performed between surgeons, anesthesia, and operating room staff, and all were in agreement as to the patient, procedure, and site.  Tourniquet at the proximal aspect of the forearm had been inflated for the Bier block.  Incision was made at the volar aspect of the proximal finger flexion crease of the long finger.  Skin subcutaneous tissues by spreading technique.  Bipolar electrocautery was used to obtain hemostasis.  The mass was identified.  It was at the ulnar side of the A2 pulley.  It was freed up of soft tissue attachments and removed.  A window was made in the A2 pulley to prevent cyst recurrence.  There was another small cyst distal to this.  This was also removed.  A small window was made in this area as well.  The cyst was sent to Pathology for examination.  The wound was copiously irrigated with sterile saline.  It was closed with 4-0 nylon in a horizontal mattress fashion.  It was injected with 5 mL of 0.25% plain Marcaine to aid in postoperative analgesia.  The wound was then dressed with sterile Xeroform, 4x4s, and wrapped with a Coban dressing.  Tourniquet deflated 25 minutes.  Fingertips were pink with brisk  capillary refill after deflation of the tourniquet.  The operative drapes were broken down. The patient was awoken from anesthesia safely.  She was transferred back to stretcher and taken to PACU in stable condition.  I will see her back in the office in 1 week for postoperative followup.  I will give her Norco 5/325, 1-2 p.o. q.6 hours p.r.n. pain, dispensed #20.     Leanora Cover, MD   ______________________________ Leanora Cover, MD    KK/MEDQ  D:  07/24/2015  T:  07/24/2015  Job:  [id number]

## 2015-07-25 NOTE — Op Note (Signed)
Nancy [name], Nancy [name]           ACCOUNT NO.:  [id number]  MEDICAL RECORD NO.:  [id number]  LOCATION:                               FACILITY:  [facility]  PHYSICIAN:  Leanora Cover, MD        DATE OF BIRTH:  [birth year]  DATE OF PROCEDURE:  07/24/2015 DATE OF DISCHARGE:                              OPERATIVE REPORT   PREOPERATIVE DIAGNOSIS:  Left long finger annular ligament cyst.  POSTOPERATIVE DIAGNOSIS:  Left long finger annular ligament cyst.  PROCEDURE:  Excision of left long finger annular ligament cyst.  SURGEON:  Leanora Cover, MD.  ASSISTANT:  None.  ANESTHESIA:  Bier block with sedation.  IV FLUIDS:  Per anesthesia flow sheet.  ESTIMATED BLOOD LOSS:  Minimal.  COMPLICATIONS:  None.  SPECIMEN:  Cyst to Pathology.  TOURNIQUET TIME:  25 minutes.  DISPOSITION:  Stable to PACU.  INDICATIONS:  Nancy [name] is a 75 year old female, who has noted a mass in the left long finger.  It is bothersome to her.  She wished to have it excised.  Risks, benefits, and alternatives to surgery were discussed including risk of blood loss, infection, damage to nerves, vessels, tendons, ligaments, bone, failure of surgery, need for additional surgery, complications with wound healing, continued pain, and recurrence of mass.  The patient voiced understanding of risks and elected to proceed.  OPERATIVE COURSE:  After being identified preoperatively by myself, the patient and I agreed upon procedure and site of procedure.  The surgical site was marked.  The risks, benefits, and alternatives of surgery were reviewed.  She wished to proceed.  Surgical consent had been signed. She was given IV Ancef as preoperative antibiotic prophylaxis.  She was transferred to the operating room and placed on the operating room table in supine position with the left upper extremity on an armboard.  Bier block anesthesia was induced by anesthesiologist.  The left upper extremity was prepped and draped in  normal sterile orthopedic fashion. Surgical pause was performed between surgeons, anesthesia, and operating room staff, and all were in agreement as to the patient, procedure, and site.  Tourniquet at the proximal aspect of the forearm had been inflated for the Bier block.  Incision was made at the volar aspect of the proximal finger flexion crease of the long finger.  Skin subcutaneous tissues by spreading technique.  Bipolar electrocautery was used to obtain hemostasis.  The mass was identified.  It was at the ulnar side of the A2 pulley.  It was freed up of soft tissue attachments and removed.  A window was made in the A2 pulley to prevent cyst recurrence.  There was another small cyst distal to this.  This was also removed.  A small window was made in this area as well.  The cyst was sent to Pathology for examination.  The wound was copiously irrigated with sterile saline.  It was closed with 4-0 nylon in a horizontal mattress fashion.  It was injected with 5 mL of 0.25% plain Marcaine to aid in postoperative analgesia.  The wound was then dressed with sterile Xeroform, 4x4s, and wrapped with a Coban dressing.  Tourniquet deflated 25 minutes.  Fingertips were pink with brisk  capillary refill after deflation of the tourniquet.  The operative drapes were broken down. The patient was awoken from anesthesia safely.  She was transferred back to stretcher and taken to PACU in stable condition.  I will see her back in the office in 1 week for postoperative followup.  I will give her Norco 5/325, 1-2 p.o. q.6 hours p.r.n. pain, dispensed #20.     Leanora Cover, MD   ______________________________ Leanora Cover, MD    KK/MEDQ  D:  07/24/2015  T:  07/24/2015  Job:  [id number]

## 2015-07-27 ENCOUNTER — Encounter (HOSPITAL_BASED_OUTPATIENT_CLINIC_OR_DEPARTMENT_OTHER): Payer: Self-pay | Admitting: Orthopedic Surgery

## 2015-08-15 ENCOUNTER — Ambulatory Visit: Payer: Self-pay | Admitting: Physician Assistant

## 2015-08-20 ENCOUNTER — Encounter: Payer: Self-pay | Admitting: Cardiology

## 2015-08-20 ENCOUNTER — Telehealth: Payer: Self-pay

## 2015-08-20 NOTE — Telephone Encounter (Signed)
Received surgical clearance request from [facility] for spinal procedure. Per Dr. Ihor Gully was to be called to see if she has had any symptoms prior to giving clearance.  Patient reports no CP, swelling, or SOB.

## 2015-08-29 ENCOUNTER — Encounter: Payer: Self-pay | Admitting: Podiatry

## 2015-08-29 ENCOUNTER — Ambulatory Visit (INDEPENDENT_AMBULATORY_CARE_PROVIDER_SITE_OTHER): Payer: Medicare Other | Admitting: Podiatry

## 2015-08-29 DIAGNOSIS — M79673 Pain in unspecified foot: Secondary | ICD-10-CM

## 2015-08-29 DIAGNOSIS — G629 Polyneuropathy, unspecified: Secondary | ICD-10-CM | POA: Diagnosis not present

## 2015-08-29 DIAGNOSIS — M7740 Metatarsalgia, unspecified foot: Secondary | ICD-10-CM

## 2015-09-01 NOTE — Progress Notes (Signed)
Subjective: 75 year old female presents the office today for concerns of continued burning, tingling pain to both of her feet. She is currently taking gabapentin 600 mg in the morning and at night in 300 and at lunch time. She has Become punk cream and she feels that this has helped somewhat. Offloading pads were also help the pain in the ball of her feet. No recent injury trauma. No swelling or redness. He denies any claudication symptoms. Denies any systemic complaints such as fevers, chills, nausea, vomiting. No acute changes since last appointment, and no other complaints at this time.   She is awaiting back surgery.  Objective: AAO x3, NAD DP/PT pulses palpable bilaterally, CRT less than 3 seconds Decreased sensation with Simms Weinstein monofilament decreased from Dr. Niel Hummer. There is no specific area pinpoint bony tenderness is no pain vibratory sensation. His prominent metatarsal heads after the fat pad. Although there is no tenderness states it gently she states that she has pain in this area with walking. No overlying edema, erythema, increase in warmth. No edema, erythema, increase in warmth to bilateral lower extremities.  No open lesions or pre-ulcerative lesions.  No pain with calf compression, swelling, warmth, erythema  Assessment: Metatarsalgia/capsulitis, neuropathy  Plan: -All treatment options discussed with the patient including all alternatives, risks, complications.  -I discussed the increase in intensity gabapentin however discussed with her primary care physician prior to doing this. She states that sometimes she'll take 3 pills at nighttime to help her sleep due to the pain and this does help. Continue the compound cream as well. -Metatarsal offloading pads to help with the foot pain. Also discussed shoe gear modifications. -Patient encouraged to call the office with any questions, concerns, change in symptoms.   Celesta Gentile, DPM

## 2015-09-02 ENCOUNTER — Encounter: Payer: Self-pay | Admitting: Cardiology

## 2015-09-02 NOTE — Progress Notes (Signed)
Cardiology Office Note    Date:  09/03/2015   ID:  Nancy [name], DOB 1940-03-30, MRN [id number]  PCP:  Antony Blackbird, MD  Cardiologist:  Fransico Him, MD   Chief Complaint  Patient presents with  . Hypertension    History of Present Illness:  Nancy [name] is a 75 y.o. female with a history of HTN and diastolic dysfunction who presents today for followup. She has chronic exertional SOB (mainly with extreme exertion and thinks its because of back problems)  and problems with fatigue that are stable and have not changed any and she thinks that is due to her back problems.When I saw her last she has some atypical CP and nuclear stress test showed no ischemia.  She is doing well today.  She denies any  LE edema, dizziness, palpitations or syncope.  She has not had any more of the CP she had when I saw her last.    Past Medical History:  Diagnosis Date  . Anxiety   . Breast cyst    left breast  . Chest pain   . Complication of anesthesia 1986   slow to awaken after cholecystectomy  . Diastolic dysfunction    with mild pulmonary HTN by echo 07/2011  . Gastric ulcer   . GERD (gastroesophageal reflux disease)   . Gout   . Hyperlipidemia   . Hypertension   . Osteoarthritis   . Osteopenia   . Spine deformity    lower spine  . Type II diabetes mellitus ([place])     Past Surgical History:  Procedure Laterality Date  . ABDOMINAL ADHESION SURGERY    . ABDOMINAL HYSTERECTOMY    . ANUS SURGERY     for torn tissue  . APPENDECTOMY    . BREAST BIOPSY    . BREAST LUMPECTOMY     left  . CHOLECYSTECTOMY  1986  . ESOPHAGOGASTRODUODENOSCOPY (EGD) WITH PROPOFOL N/A 07/18/2013   Procedure: ESOPHAGOGASTRODUODENOSCOPY (EGD) WITH PROPOFOL;  Surgeon: Arta Silence, MD;  Location: [facility];  Service: Endoscopy;  Laterality: N/A;  . EUS N/A 07/18/2013   Procedure: ESOPHAGEAL ENDOSCOPIC ULTRASOUND (EUS) RADIAL;  Surgeon: Arta Silence, MD;  Location: [facility];  Service:  Endoscopy;  Laterality: N/A;  . MASS EXCISION Left 07/24/2015   Procedure: EXCISION MASS LEFT LONG FINGER;  Surgeon: Leanora Cover, MD;  Location: [facility];  Service: Orthopedics;  Laterality: Left;    Current Medications: Outpatient Medications Prior to Visit  Medication Sig Dispense Refill  . AIMSCO INSULIN SYR ULTRA THIN 31G X 5/16" 0.3 ML MISC     . allopurinol (ZYLOPRIM) 100 MG tablet Take 200 mg by mouth daily.     [name] amLODipine (NORVASC) 10 MG tablet Take 10 mg by mouth every morning.     [name] aspirin EC 81 MG tablet Take 81 mg by mouth daily.    . colchicine (COLCRYS) 0.6 MG tablet Take 0.6 mg by mouth daily as needed (Gout).     [name] dexlansoprazole (DEXILANT) 60 MG capsule Take 60 mg by mouth daily.    . diazepam (VALIUM) 5 MG tablet Take 2.5-5 mg by mouth at bedtime as needed for muscle spasms (Take 1/2 tablet or 1 tablet as needed at bedtime).     . furosemide (LASIX) 20 MG tablet TAKE ONE TABLET BY MOUTH ONCE DAILY 30 tablet 10  . gabapentin (NEURONTIN) 300 MG capsule Take 300 mg by mouth 3 (three) times daily.     [name] glipiZIDE (GLUCOTROL) 5  MG tablet Take 5 mg by mouth daily before breakfast.    . HYDROcodone-acetaminophen (NORCO) 5-325 MG tablet 1-2 tabs po q6 hours prn pain 20 tablet 0  . HYDROcodone-acetaminophen (NORCO/VICODIN) 5-325 MG per tablet Take 1 tablet by mouth as needed.    [name] lisinopril (PRINIVIL,ZESTRIL) 40 MG tablet Take 40 mg by mouth every morning.     . meloxicam (MOBIC) 7.5 MG tablet Take 7.5 mg by mouth daily.    . metFORMIN (GLUCOPHAGE) 1000 MG tablet Take 1,000 mg by mouth 2 (two) times daily with a meal.      . metoprolol (LOPRESSOR) 100 MG tablet Take 100 mg by mouth every morning.     . NONFORMULARY OR COMPOUNDED [place] compound:  Peripheral Neuropathy cream - Bupivacaine 1%, doxepin 3%, Gabapentin 6%, Pentoxifylline 3%, Topiramate 1%, apply 1-2 grams to affected area 3-4 times daily. 120 each 2  . nystatin cream (MYCOSTATIN)  Apply 1 application topically 2 (two) times daily.    . ONE TOUCH ULTRA TEST test strip     . Polyethyl Glycol-Propyl Glycol (SYSTANE OP) Apply 1 drop to eye as needed (Dry eyes).    . predniSONE (DELTASONE) 20 MG tablet Take 20 mg by mouth as needed (for swelling).     . simvastatin (ZOCOR) 20 MG tablet Take 20 mg by mouth every evening.     . traZODone (DESYREL) 50 MG tablet Take 50-100 mg by mouth at bedtime. Take 1 to 2 tablets at bedtime to help with sleep     No facility-administered medications prior to visit.      Allergies:   Codeine   Social History   Social History  . Marital status: Divorced    Spouse name: N/A  . Number of children: 4  . Years of education: 39   Social History Main Topics  . Smoking status: Never Smoker  . Smokeless tobacco: Never Used  . Alcohol use Yes     Comment: occasional wine  . Drug use: No  . Sexual activity: Not Asked   Other Topics Concern  . None   Social History Narrative   Patient is single, has 3 children living 1 deceased   Patient is right handed   Education level is 12   Caffeine consumption is 0     Family History:  The patient's family history includes Cancer in her sister; Heart disease in her mother.   ROS:   Please see the history of present illness.    Review of Systems  Musculoskeletal: Positive for back pain.  Neurological: Positive for loss of balance.   All other systems reviewed and are negative.   PHYSICAL EXAM:   VS:  BP (!) 170/62   Pulse 87   Ht 5' 1.5" (1.562 m)   Wt 180 lb 6.4 oz (81.8 kg)   SpO2 97%   BMI 33.53 kg/m    GEN: Well nourished, well developed, in no acute distress  HEENT: normal  Neck: no JVD, carotid bruits, or masses Cardiac: RRR; no murmurs, rubs, or gallops,no edema.  Intact distal pulses bilaterally.  Respiratory:  clear to auscultation bilaterally, normal work of breathing GI: soft, nontender, nondistended, + BS MS: no deformity or atrophy  Skin: warm and dry, no  rash Neuro:  Alert and Oriented x 3, Strength and sensation are intact Psych: euthymic mood, full affect  Wt Readings from Last 3 Encounters:  09/03/15 180 lb 6.4 oz (81.8 kg)  07/24/15 180 lb 8 oz (81.9 kg)  02/27/15 192 lb (87.1 kg)      Studies/Labs Reviewed:   EKG:  EKG is not ordered today.   Recent Labs: 07/22/2015: BUN 9; Creatinine, Ser 0.88; Potassium 4.9; Sodium 139   Lipid Panel No results found for: CHOL, TRIG, HDL, CHOLHDL, VLDL, LDLCALC, LDLDIRECT  Additional studies/ records that were reviewed today include:  none    ASSESSMENT:    1. Essential hypertension, benign   2. Diastolic dysfunction   3. Preoperative clearance   4. Other osteoarthritis of spine, lumbar region      PLAN:  In order of problems listed above:  1. HTN - BP poorly controlled today.She has not taken her meds today. Continue amlodipine/ACE I and BB.  I have asked her to check her BP daily for a week and call with results.  2. Diastolic dysfunction - continue treatment for HTN.  No evidence of CHF on exam.  3. Preoperative cardiac clearance.  She is not having any anginal symptoms and had a nuclear stress test a year ago showing no ischemia.  She is low risk from a cardiac standpoint for back surgery.     Medication Adjustments/Labs and Tests Ordered: Current medicines are reviewed at length with the patient today.  Concerns regarding medicines are outlined above.  Medication changes, Labs and Tests ordered today are listed in the Patient Instructions below.  There are no Patient Instructions on file for this visit.   Signed, Fransico Him, MD  09/03/2015 10:49 AM    [address], [place], [place]  [address] Phone: [phone]; Fax: [phone]

## 2015-09-03 ENCOUNTER — Encounter: Payer: Self-pay | Admitting: Cardiology

## 2015-09-03 ENCOUNTER — Ambulatory Visit (INDEPENDENT_AMBULATORY_CARE_PROVIDER_SITE_OTHER): Payer: Medicare Other | Admitting: Cardiology

## 2015-09-03 ENCOUNTER — Encounter (INDEPENDENT_AMBULATORY_CARE_PROVIDER_SITE_OTHER): Payer: Self-pay

## 2015-09-03 VITALS — BP 170/62 | HR 87 | Ht 61.5 in | Wt 180.4 lb

## 2015-09-03 DIAGNOSIS — I5189 Other ill-defined heart diseases: Secondary | ICD-10-CM

## 2015-09-03 DIAGNOSIS — I10 Essential (primary) hypertension: Secondary | ICD-10-CM

## 2015-09-03 DIAGNOSIS — I519 Heart disease, unspecified: Secondary | ICD-10-CM | POA: Diagnosis not present

## 2015-09-03 DIAGNOSIS — Z01818 Encounter for other preprocedural examination: Secondary | ICD-10-CM | POA: Diagnosis not present

## 2015-09-03 DIAGNOSIS — M47896 Other spondylosis, lumbar region: Secondary | ICD-10-CM

## 2015-09-03 NOTE — Patient Instructions (Signed)
Medication Instructions:  Your physician recommends that you continue on your current medications as directed. Please refer to the Current Medication list given to you today.   Labwork: None  Testing/Procedures: None  Follow-Up: Your physician wants you to follow-up in: 1 year with Dr. Turner. You will receive a reminder letter in the mail two months in advance. If you don't receive a letter, please call our office to schedule the follow-up appointment.   Any Other Special Instructions Will Be Listed Below (If Applicable). Please check your BLOOD PRESSURE daily for a week and call with results.    If you need a refill on your cardiac medications before your next appointment, please call your pharmacy.   

## 2015-09-08 ENCOUNTER — Inpatient Hospital Stay (HOSPITAL_COMMUNITY): Admission: RE | Admit: 2015-09-08 | Payer: Medicare Other | Source: Ambulatory Visit

## 2015-09-10 ENCOUNTER — Encounter (HOSPITAL_COMMUNITY): Payer: Self-pay

## 2015-09-10 ENCOUNTER — Encounter (HOSPITAL_COMMUNITY)
Admission: RE | Admit: 2015-09-10 | Discharge: 2015-09-10 | Disposition: A | Discharge status: Home / Self Care | Payer: Medicare Other | Source: Ambulatory Visit | Attending: Orthopedic Surgery | Admitting: Orthopedic Surgery

## 2015-09-10 DIAGNOSIS — Z01818 Encounter for other preprocedural examination: Secondary | ICD-10-CM | POA: Diagnosis not present

## 2015-09-10 DIAGNOSIS — Z01812 Encounter for preprocedural laboratory examination: Secondary | ICD-10-CM | POA: Diagnosis not present

## 2015-09-10 DIAGNOSIS — Z7982 Long term (current) use of aspirin: Secondary | ICD-10-CM | POA: Diagnosis not present

## 2015-09-10 DIAGNOSIS — K219 Gastro-esophageal reflux disease without esophagitis: Secondary | ICD-10-CM | POA: Diagnosis not present

## 2015-09-10 DIAGNOSIS — M5137 Other intervertebral disc degeneration, lumbosacral region: Secondary | ICD-10-CM | POA: Insufficient documentation

## 2015-09-10 DIAGNOSIS — Z79899 Other long term (current) drug therapy: Secondary | ICD-10-CM | POA: Diagnosis not present

## 2015-09-10 DIAGNOSIS — E785 Hyperlipidemia, unspecified: Secondary | ICD-10-CM | POA: Diagnosis not present

## 2015-09-10 DIAGNOSIS — Z7984 Long term (current) use of oral hypoglycemic drugs: Secondary | ICD-10-CM | POA: Insufficient documentation

## 2015-09-10 DIAGNOSIS — E119 Type 2 diabetes mellitus without complications: Secondary | ICD-10-CM | POA: Diagnosis not present

## 2015-09-10 DIAGNOSIS — Z0183 Encounter for blood typing: Secondary | ICD-10-CM | POA: Diagnosis not present

## 2015-09-10 DIAGNOSIS — I10 Essential (primary) hypertension: Secondary | ICD-10-CM | POA: Insufficient documentation

## 2015-09-10 HISTORY — DX: Reserved for inherently not codable concepts without codable children: IMO0001

## 2015-09-10 LAB — CBC
HCT: 35.4 % — ABNORMAL LOW (ref 36.0–46.0)
Hemoglobin: 11.4 g/dL — ABNORMAL LOW (ref 12.0–15.0)
MCH: 25.9 pg — AB (ref 26.0–34.0)
MCHC: 32.2 g/dL (ref 30.0–36.0)
MCV: 80.5 fL (ref 78.0–100.0)
PLATELETS: 245 10*3/uL (ref 150–400)
RBC: 4.4 MIL/uL (ref 3.87–5.11)
RDW: 15.9 % — ABNORMAL HIGH (ref 11.5–15.5)
WBC: 6.7 10*3/uL (ref 4.0–10.5)

## 2015-09-10 LAB — SURGICAL PCR SCREEN
MRSA, PCR: POSITIVE — AB
STAPHYLOCOCCUS AUREUS: POSITIVE — AB

## 2015-09-10 LAB — BASIC METABOLIC PANEL
Anion gap: 9 (ref 5–15)
BUN: 8 mg/dL (ref 6–20)
CO2: 23 mmol/L (ref 22–32)
CREATININE: 0.82 mg/dL (ref 0.44–1.00)
Calcium: 9.3 mg/dL (ref 8.9–10.3)
Chloride: 107 mmol/L (ref 101–111)
Glucose, Bld: 174 mg/dL — ABNORMAL HIGH (ref 65–99)
POTASSIUM: 3.4 mmol/L — AB (ref 3.5–5.1)
SODIUM: 139 mmol/L (ref 135–145)

## 2015-09-10 LAB — GLUCOSE, CAPILLARY: Glucose-Capillary: 215 mg/dL — ABNORMAL HIGH (ref 65–99)

## 2015-09-10 LAB — TYPE AND SCREEN
ABO/RH(D): A POS
Antibody Screen: NEGATIVE

## 2015-09-10 LAB — ABO/RH: ABO/RH(D): A POS

## 2015-09-10 NOTE — Pre-Procedure Instructions (Signed)
Nancy [name]  09/10/2015      [facility] [place], [place] - [address] [place] [address] Phone: [phone] Fax: [phone]    Your procedure is scheduled on Sept 6.  Report to [facility] Admitting at 630 A.M.  Call this number if you have problems the morning of surgery:   [phone]   Remember:  Do not eat food or drink liquids after midnight.   Take these medicines the morning of surgery with A SIP OF WATER Allopurinol (Zyloprim), amlodipine (Norvasc), Colchicine (Colcrys) if needed, Gabapentin (Neurontin), Hydrocodone (Norco) if needed, Metoprolol (Lopressor), Omeprazole (Prilosec), Prednisone (Deltasone) if needed  1 week prior to surgery stop taking aspirin, BC's, Goody's, Herbal medications, Fish oil, Ibuprofen, Advil, Motrin, Aleve, Meloxicam (Mobic)    How to Manage Your Diabetes Before and After Surgery  Why is it important to control my blood sugar before and after surgery? . Improving blood sugar levels before and after surgery helps healing and can limit problems. . A way of improving blood sugar control is eating a healthy diet by: o  Eating less sugar and carbohydrates o  Increasing activity/exercise o  Talking with your doctor about reaching your blood sugar goals . High blood sugars (greater than 180 mg/dL) can raise your risk of infections and slow your recovery, so you will need to focus on controlling your diabetes during the weeks before surgery. . Make sure that the doctor who takes care of your diabetes knows about your planned surgery including the date and location.  How do I manage my blood sugar before surgery? . Check your blood sugar at least 4 times a day, starting 2 days before surgery, to make sure that the level is not too high or low. o Check your blood sugar the morning of your surgery when you wake up and every 2 hours until you get to the Short Stay unit. . If  your blood sugar is less than 70 mg/dL, you will need to treat for low blood sugar: o Do not take insulin. o Treat a low blood sugar (less than 70 mg/dL) with  cup of clear juice (cranberry or apple), 4 glucose tablets, OR glucose gel. o Recheck blood sugar in 15 minutes after treatment (to make sure it is greater than 70 mg/dL). If your blood sugar is not greater than 70 mg/dL on recheck, call [phone] for further instructions. . Report your blood sugar to the short stay nurse when you get to Short Stay.  . If you are admitted to the hospital after surgery: o Your blood sugar will be checked by the staff and you will probably be given insulin after surgery (instead of oral diabetes medicines) to make sure you have good blood sugar levels. o The goal for blood sugar control after surgery is 80-180 mg/dL.      WHAT DO I DO ABOUT MY DIABETES MEDICATION?   [name] Do not take oral diabetes medicines (pills) the morning of surgery. Glipizide (Glucotrol) and Metformin (Glucophage)  . THE NIGHT BEFORE SURGERY, take ___________ units of ___________insulin.       [name] HE MORNING OF SURGERY, take _____________ units of __________insulin.  . The day of surgery, do not take other diabetes injectables, including Byetta (exenatide), Bydureon (exenatide ER), Victoza (liraglutide), or Trulicity (dulaglutide).  . If your CBG is greater than 220 mg/dL, you may take  of your sliding scale (correction) dose of insulin.  Other  Instructions:          Patient Signature:  Date:   Nurse Signature:  Date:   Reviewed and Endorsed by [facility] Patient Education Committee, August 2015  Do not wear jewelry, make-up or nail polish.  Do not wear lotions, powders, or perfumes, or deoderant.  Do not shave 48 hours prior to surgery.  Men may shave face and neck.  Do not bring valuables to the hospital.  [facility] is not responsible for any belongings or valuables.  Contacts, dentures or bridgework may  not be worn into surgery.  Leave your suitcase in the car.  After surgery it may be brought to your room.  For patients admitted to the hospital, discharge time will be determined by your treatment team.  Patients discharged the day of surgery will not be allowed to drive home.    Special instructions:  [place] - Preparing for Surgery  Before surgery, you can play an important role.  Because skin is not sterile, your skin needs to be as free of germs as possible.  You can reduce the number of germs on you skin by washing with CHG (chlorahexidine gluconate) soap before surgery.  CHG is an antiseptic cleaner which kills germs and bonds with the skin to continue killing germs even after washing.  Please DO NOT use if you have an allergy to CHG or antibacterial soaps.  If your skin becomes reddened/irritated stop using the CHG and inform your nurse when you arrive at Short Stay.  Do not shave (including legs and underarms) for at least 48 hours prior to the first CHG shower.  You may shave your face.  Please follow these instructions carefully:   1.  Shower with CHG Soap the night before surgery and the                                morning of Surgery.  2.  If you choose to wash your hair, wash your hair first as usual with your       normal shampoo.  3.  After you shampoo, rinse your hair and body thoroughly to remove the                      Shampoo.  4.  Use CHG as you would any other liquid soap.  You can apply chg directly       to the skin and wash gently with scrungie or a clean washcloth.  5.  Apply the CHG Soap to your body ONLY FROM THE NECK DOWN.        Do not use on open wounds or open sores.  Avoid contact with your eyes,       ears, mouth and genitals (private parts).  Wash genitals (private parts)       with your normal soap.  6.  Wash thoroughly, paying special attention to the area where your surgery        will be performed.  7.  Thoroughly rinse your body with warm water  from the neck down.  8.  DO NOT shower/wash with your normal soap after using and rinsing off       the CHG Soap.  9.  Pat yourself dry with a clean towel.            10.  Wear clean pajamas.  11.  Place clean sheets on your bed the night of your first shower and do not        sleep with pets.  Day of Surgery  Do not apply any lotions/deoderants the morning of surgery.  Please wear clean clothes to the hospital/surgery center.     Please read over the following fact sheets that you were given. Pain Booklet, Blood Transfusion Information, MRSA Information and Surgical Site Infection Prevention

## 2015-09-10 NOTE — Progress Notes (Addendum)
PCP is Dr. Antony Blackbird Cardiologist is Dr.-Traci Radford Pax Echo noted form 2015 Stress test noted from 2016 Reports fasting Cbg's 99-150

## 2015-09-11 LAB — HEMOGLOBIN A1C
HEMOGLOBIN A1C: 8.6 % — AB (ref 4.8–5.6)
MEAN PLASMA GLUCOSE: 200 mg/dL

## 2015-09-11 NOTE — Progress Notes (Signed)
Anesthesia Chart Review:  Pt is a 75 year old female scheduled for L5-S1 transforaminal lumbar interbody fusion, L4-S1 posterior spinal fusion on 09/17/2015 with Melina Schools, MD.   - PCP is Antony Blackbird, MD.  - Cardiologist is Fransico Him, MD who has cleared pt for surgery at low risk.   PMH includes:  HTN, DM, hyperlipidemia, GERD. Never smoker. BMI 34  Medications include: amlodipine, ASA, lasix, glipizide, lisinopril, metformin, metoprolol, prilosec, simvastatin  Preoperative labs reviewed.  HgbA1c 8.6, glucose 174  EKG 07/22/15: NSR.   Cardiac event monitor 09/30/14:   Normal Sinus Rhythm with average heart rate 74 bpm.  PACs  Atrial triplet  Sinus tachycardia up to 127 bpm  Ectopic atrial rhythm  Nuclear stress test 08/27/14:   Nuclear stress EF: 81%.  The left ventricular ejection fraction is hyperdynamic (>65%).  There was no ST segment deviation noted during stress.  The study is normal.  Echo 02/01/13:  - Left ventricle: The cavity size was normal. There was moderate concentric hypertrophy. Systolic function was vigorous. The estimated ejection fraction was in the rangeof 65% to 70%. Wall motion was normal; there were no regional wall motion abnormalities. - Left atrium: The atrium was mildly dilated. - Atrial septum: No defect or patent foramen ovale was identified.  If no changes, I anticipate pt can proceed with surgery as scheduled.   Willeen Cass, FNP-BC [facility]/Anesthesiology Phone: [phone] 09/11/2015 1:30 PM

## 2015-09-16 MED ORDER — CEFAZOLIN SODIUM-DEXTROSE 2-4 GM/100ML-% IV SOLN
2.0000 g | INTRAVENOUS | Status: AC
  Administered 2015-09-17 (×2): 2 g via INTRAVENOUS
  Filled 2015-09-16: qty 100

## 2015-09-17 ENCOUNTER — Inpatient Hospital Stay (HOSPITAL_COMMUNITY): Payer: Medicare Other

## 2015-09-17 ENCOUNTER — Inpatient Hospital Stay (HOSPITAL_COMMUNITY)
Admission: RE | Disposition: A | Discharge status: Home Health | Payer: Self-pay | Source: Ambulatory Visit | Attending: Orthopedic Surgery

## 2015-09-17 ENCOUNTER — Inpatient Hospital Stay (HOSPITAL_COMMUNITY)
Admission: RE | Admit: 2015-09-17 | Discharge: 2015-09-22 | DRG: 460 | Disposition: A | Discharge status: Home Health | Payer: Medicare Other | Source: Ambulatory Visit | Attending: Orthopedic Surgery | Admitting: Orthopedic Surgery

## 2015-09-17 ENCOUNTER — Inpatient Hospital Stay (HOSPITAL_COMMUNITY): Payer: Medicare Other | Admitting: Emergency Medicine

## 2015-09-17 ENCOUNTER — Encounter (HOSPITAL_COMMUNITY): Payer: Self-pay | Admitting: *Deleted

## 2015-09-17 ENCOUNTER — Inpatient Hospital Stay (HOSPITAL_COMMUNITY): Payer: Medicare Other | Admitting: Critical Care Medicine

## 2015-09-17 DIAGNOSIS — K219 Gastro-esophageal reflux disease without esophagitis: Secondary | ICD-10-CM | POA: Diagnosis present

## 2015-09-17 DIAGNOSIS — M8580 Other specified disorders of bone density and structure, unspecified site: Secondary | ICD-10-CM | POA: Diagnosis present

## 2015-09-17 DIAGNOSIS — M5116 Intervertebral disc disorders with radiculopathy, lumbar region: Secondary | ICD-10-CM | POA: Diagnosis present

## 2015-09-17 DIAGNOSIS — G8918 Other acute postprocedural pain: Secondary | ICD-10-CM | POA: Diagnosis not present

## 2015-09-17 DIAGNOSIS — K5900 Constipation, unspecified: Secondary | ICD-10-CM | POA: Diagnosis not present

## 2015-09-17 DIAGNOSIS — I10 Essential (primary) hypertension: Secondary | ICD-10-CM | POA: Diagnosis present

## 2015-09-17 DIAGNOSIS — E119 Type 2 diabetes mellitus without complications: Secondary | ICD-10-CM | POA: Diagnosis present

## 2015-09-17 DIAGNOSIS — Z794 Long term (current) use of insulin: Secondary | ICD-10-CM

## 2015-09-17 DIAGNOSIS — M1990 Unspecified osteoarthritis, unspecified site: Secondary | ICD-10-CM | POA: Diagnosis present

## 2015-09-17 DIAGNOSIS — Z7982 Long term (current) use of aspirin: Secondary | ICD-10-CM | POA: Diagnosis not present

## 2015-09-17 DIAGNOSIS — I272 Other secondary pulmonary hypertension: Secondary | ICD-10-CM | POA: Diagnosis present

## 2015-09-17 DIAGNOSIS — M4806 Spinal stenosis, lumbar region: Secondary | ICD-10-CM | POA: Diagnosis present

## 2015-09-17 DIAGNOSIS — M4316 Spondylolisthesis, lumbar region: Secondary | ICD-10-CM | POA: Diagnosis present

## 2015-09-17 DIAGNOSIS — M5117 Intervertebral disc disorders with radiculopathy, lumbosacral region: Secondary | ICD-10-CM | POA: Diagnosis present

## 2015-09-17 DIAGNOSIS — Z981 Arthrodesis status: Secondary | ICD-10-CM

## 2015-09-17 DIAGNOSIS — E785 Hyperlipidemia, unspecified: Secondary | ICD-10-CM | POA: Diagnosis present

## 2015-09-17 DIAGNOSIS — M549 Dorsalgia, unspecified: Secondary | ICD-10-CM | POA: Diagnosis present

## 2015-09-17 DIAGNOSIS — Z419 Encounter for procedure for purposes other than remedying health state, unspecified: Secondary | ICD-10-CM

## 2015-09-17 HISTORY — PX: SPINAL FUSION: SHX223

## 2015-09-17 HISTORY — PX: TRANSFORAMINAL LUMBAR INTERBODY FUSION (TLIF) WITH PEDICLE SCREW FIXATION 1 LEVEL: SHX6141

## 2015-09-17 LAB — GLUCOSE, CAPILLARY
GLUCOSE-CAPILLARY: 180 mg/dL — AB (ref 65–99)
Glucose-Capillary: 130 mg/dL — ABNORMAL HIGH (ref 65–99)
Glucose-Capillary: 193 mg/dL — ABNORMAL HIGH (ref 65–99)
Glucose-Capillary: 199 mg/dL — ABNORMAL HIGH (ref 65–99)

## 2015-09-17 SURGERY — TRANSFORAMINAL LUMBAR INTERBODY FUSION (TLIF) WITH PEDICLE SCREW FIXATION 1 LEVEL
Anesthesia: General | Site: Back

## 2015-09-17 MED ORDER — LACTATED RINGERS IV SOLN
INTRAVENOUS | Status: DC | PRN
  Administered 2015-09-17: 09:00:00 via INTRAVENOUS

## 2015-09-17 MED ORDER — ONDANSETRON HCL 4 MG/2ML IJ SOLN
INTRAMUSCULAR | Status: AC
  Filled 2015-09-17: qty 2

## 2015-09-17 MED ORDER — 0.9 % SODIUM CHLORIDE (POUR BTL) OPTIME
TOPICAL | Status: DC | PRN
  Administered 2015-09-17 (×3): 1000 mL

## 2015-09-17 MED ORDER — SIMVASTATIN 20 MG PO TABS
20.0000 mg | ORAL_TABLET | Freq: Every evening | ORAL | Status: DC
  Administered 2015-09-17 – 2015-09-22 (×5): 20 mg via ORAL
  Filled 2015-09-17 (×6): qty 1

## 2015-09-17 MED ORDER — METFORMIN HCL 500 MG PO TABS
1000.0000 mg | ORAL_TABLET | Freq: Two times a day (BID) | ORAL | Status: DC
  Administered 2015-09-17 – 2015-09-22 (×10): 1000 mg via ORAL
  Filled 2015-09-17 (×11): qty 2

## 2015-09-17 MED ORDER — OXYCODONE HCL 5 MG PO TABS
ORAL_TABLET | ORAL | Status: AC
Start: 2015-09-17 — End: 2015-09-18
  Filled 2015-09-17: qty 1

## 2015-09-17 MED ORDER — INSULIN ASPART 100 UNIT/ML ~~LOC~~ SOLN
0.0000 [IU] | Freq: Three times a day (TID) | SUBCUTANEOUS | Status: DC
  Administered 2015-09-18 (×2): 3 [IU] via SUBCUTANEOUS
  Administered 2015-09-18 – 2015-09-19 (×2): 5 [IU] via SUBCUTANEOUS
  Administered 2015-09-19: 3 [IU] via SUBCUTANEOUS
  Administered 2015-09-19: 2 [IU] via SUBCUTANEOUS
  Administered 2015-09-20 – 2015-09-21 (×3): 3 [IU] via SUBCUTANEOUS
  Administered 2015-09-21: 2 [IU] via SUBCUTANEOUS
  Administered 2015-09-22: 3 [IU] via SUBCUTANEOUS
  Administered 2015-09-22: 5 [IU] via SUBCUTANEOUS

## 2015-09-17 MED ORDER — INSULIN ASPART 100 UNIT/ML ~~LOC~~ SOLN
0.0000 [IU] | SUBCUTANEOUS | Status: DC
  Administered 2015-09-17: 3 [IU] via SUBCUTANEOUS

## 2015-09-17 MED ORDER — DEXAMETHASONE SODIUM PHOSPHATE 10 MG/ML IJ SOLN
INTRAMUSCULAR | Status: AC
  Filled 2015-09-17: qty 1

## 2015-09-17 MED ORDER — LACTATED RINGERS IV SOLN
INTRAVENOUS | Status: DC
Start: 2015-09-17 — End: 2015-09-22
  Administered 2015-09-19 – 2015-09-20 (×2): via INTRAVENOUS

## 2015-09-17 MED ORDER — OXYCODONE HCL 5 MG PO TABS
5.0000 mg | ORAL_TABLET | Freq: Once | ORAL | Status: AC | PRN
  Administered 2015-09-17: 5 mg via ORAL

## 2015-09-17 MED ORDER — MORPHINE SULFATE (PF) 2 MG/ML IV SOLN
1.0000 mg | INTRAVENOUS | Status: DC | PRN
  Administered 2015-09-17: 4 mg via INTRAVENOUS
  Filled 2015-09-17: qty 2

## 2015-09-17 MED ORDER — SUCCINYLCHOLINE CHLORIDE 20 MG/ML IJ SOLN
INTRAMUSCULAR | Status: DC | PRN
  Administered 2015-09-17: 120 mg via INTRAVENOUS

## 2015-09-17 MED ORDER — ALLOPURINOL 100 MG PO TABS
200.0000 mg | ORAL_TABLET | Freq: Every day | ORAL | Status: DC
Start: 2015-09-18 — End: 2015-09-22
  Administered 2015-09-18 – 2015-09-22 (×5): 200 mg via ORAL
  Filled 2015-09-17 (×5): qty 2

## 2015-09-17 MED ORDER — HEMOSTATIC AGENTS (NO CHARGE) OPTIME
TOPICAL | Status: DC | PRN
  Administered 2015-09-17 (×2): 1 via TOPICAL

## 2015-09-17 MED ORDER — PROPOFOL 500 MG/50ML IV EMUL
INTRAVENOUS | Status: DC | PRN
  Administered 2015-09-17: 50 ug/kg/min via INTRAVENOUS

## 2015-09-17 MED ORDER — THROMBIN 20000 UNITS EX SOLR
CUTANEOUS | Status: AC
  Filled 2015-09-17: qty 20000

## 2015-09-17 MED ORDER — TRAZODONE HCL 50 MG PO TABS
50.0000 mg | ORAL_TABLET | Freq: Every day | ORAL | Status: DC
  Administered 2015-09-17 – 2015-09-18 (×2): 100 mg via ORAL
  Filled 2015-09-17 (×3): qty 2

## 2015-09-17 MED ORDER — ONDANSETRON HCL 4 MG/2ML IJ SOLN
4.0000 mg | INTRAMUSCULAR | Status: DC | PRN
  Filled 2015-09-17: qty 2

## 2015-09-17 MED ORDER — LIDOCAINE HCL (CARDIAC) 20 MG/ML IV SOLN
INTRAVENOUS | Status: DC | PRN
Start: 2015-09-17 — End: 2015-09-17
  Administered 2015-09-17: 60 mg via INTRAVENOUS

## 2015-09-17 MED ORDER — FENTANYL CITRATE (PF) 100 MCG/2ML IJ SOLN
INTRAMUSCULAR | Status: AC
  Filled 2015-09-17: qty 2

## 2015-09-17 MED ORDER — ACETAMINOPHEN 10 MG/ML IV SOLN
1000.0000 mg | INTRAVENOUS | Status: AC
  Administered 2015-09-17: 1000 mg via INTRAVENOUS
  Filled 2015-09-17: qty 100

## 2015-09-17 MED ORDER — POLYETHYL GLYCOL-PROPYL GLYCOL 0.4-0.3 % OP GEL
OPHTHALMIC | Status: DC | PRN
  Filled 2015-09-17: qty 10

## 2015-09-17 MED ORDER — FENTANYL CITRATE (PF) 100 MCG/2ML IJ SOLN
INTRAMUSCULAR | Status: DC | PRN
  Administered 2015-09-17 (×5): 50 ug via INTRAVENOUS

## 2015-09-17 MED ORDER — ONDANSETRON HCL 4 MG/2ML IJ SOLN
INTRAMUSCULAR | Status: DC | PRN
  Administered 2015-09-17: 4 mg via INTRAVENOUS

## 2015-09-17 MED ORDER — CEFAZOLIN IN D5W 1 GM/50ML IV SOLN
1.0000 g | Freq: Three times a day (TID) | INTRAVENOUS | Status: AC
  Administered 2015-09-17 – 2015-09-18 (×2): 1 g via INTRAVENOUS
  Filled 2015-09-17 (×2): qty 50

## 2015-09-17 MED ORDER — ALBUMIN HUMAN 5 % IV SOLN
INTRAVENOUS | Status: DC | PRN
  Administered 2015-09-17: 11:00:00 via INTRAVENOUS

## 2015-09-17 MED ORDER — AMLODIPINE BESYLATE 10 MG PO TABS
10.0000 mg | ORAL_TABLET | Freq: Every morning | ORAL | Status: DC
  Administered 2015-09-18 – 2015-09-22 (×5): 10 mg via ORAL
  Filled 2015-09-17 (×5): qty 1

## 2015-09-17 MED ORDER — GLIPIZIDE 5 MG PO TABS
5.0000 mg | ORAL_TABLET | Freq: Every day | ORAL | Status: DC
  Administered 2015-09-18 – 2015-09-22 (×5): 5 mg via ORAL
  Filled 2015-09-17 (×6): qty 1

## 2015-09-17 MED ORDER — OXYCODONE HCL 5 MG/5ML PO SOLN: 5.0000 mg | Freq: Once | ORAL | Status: AC | PRN

## 2015-09-17 MED ORDER — SODIUM CHLORIDE 0.9% FLUSH: 3.0000 mL | INTRAVENOUS | Status: DC | PRN

## 2015-09-17 MED ORDER — DEXAMETHASONE SODIUM PHOSPHATE 4 MG/ML IJ SOLN
INTRAMUSCULAR | Status: DC | PRN
  Administered 2015-09-17: 4 mg via INTRAVENOUS

## 2015-09-17 MED ORDER — DIAZEPAM 5 MG PO TABS: 5.0000 mg | ORAL_TABLET | Freq: Three times a day (TID) | ORAL | 0 refills | Status: DC | PRN

## 2015-09-17 MED ORDER — METOPROLOL TARTRATE 100 MG PO TABS
100.0000 mg | ORAL_TABLET | Freq: Every morning | ORAL | Status: DC
  Administered 2015-09-18 – 2015-09-22 (×5): 100 mg via ORAL
  Filled 2015-09-17 (×4): qty 1
  Filled 2015-09-17: qty 4

## 2015-09-17 MED ORDER — LISINOPRIL 20 MG PO TABS
40.0000 mg | ORAL_TABLET | Freq: Every morning | ORAL | Status: DC
  Administered 2015-09-18 – 2015-09-22 (×5): 40 mg via ORAL
  Filled 2015-09-17 (×5): qty 2

## 2015-09-17 MED ORDER — KETAMINE HCL 10 MG/ML IJ SOLN
INTRAMUSCULAR | Status: DC | PRN
  Administered 2015-09-17: 10 mg via INTRAVENOUS
  Administered 2015-09-17: 40 mg via INTRAVENOUS

## 2015-09-17 MED ORDER — OXYCODONE HCL 5 MG PO TABS
10.0000 mg | ORAL_TABLET | ORAL | Status: DC | PRN
  Administered 2015-09-17 – 2015-09-20 (×10): 10 mg via ORAL
  Filled 2015-09-17 (×11): qty 2

## 2015-09-17 MED ORDER — MIDAZOLAM HCL 5 MG/5ML IJ SOLN
INTRAMUSCULAR | Status: DC | PRN
  Administered 2015-09-17: 1 mg via INTRAVENOUS

## 2015-09-17 MED ORDER — ONDANSETRON HCL 4 MG PO TABS: 4.0000 mg | ORAL_TABLET | Freq: Three times a day (TID) | ORAL | 0 refills | Status: DC | PRN

## 2015-09-17 MED ORDER — BUPIVACAINE-EPINEPHRINE 0.25% -1:200000 IJ SOLN
INTRAMUSCULAR | Status: DC | PRN
  Administered 2015-09-17 (×2): 10 mL

## 2015-09-17 MED ORDER — MENTHOL 3 MG MT LOZG: 1.0000 | LOZENGE | OROMUCOSAL | Status: DC | PRN

## 2015-09-17 MED ORDER — FENTANYL CITRATE (PF) 100 MCG/2ML IJ SOLN
25.0000 ug | INTRAMUSCULAR | Status: DC | PRN
  Administered 2015-09-17 (×4): 25 ug via INTRAVENOUS

## 2015-09-17 MED ORDER — FENTANYL CITRATE (PF) 100 MCG/2ML IJ SOLN
INTRAMUSCULAR | Status: AC
  Filled 2015-09-17: qty 4

## 2015-09-17 MED ORDER — PROPOFOL 10 MG/ML IV BOLUS
INTRAVENOUS | Status: DC | PRN
  Administered 2015-09-17: 120 mg via INTRAVENOUS

## 2015-09-17 MED ORDER — PHENOL 1.4 % MT LIQD: 1.0000 | OROMUCOSAL | Status: DC | PRN

## 2015-09-17 MED ORDER — OXYCODONE-ACETAMINOPHEN 10-325 MG PO TABS: 1.0000 | ORAL_TABLET | ORAL | 0 refills | Status: DC | PRN

## 2015-09-17 MED ORDER — LACTATED RINGERS IV SOLN
INTRAVENOUS | Status: DC | PRN
  Administered 2015-09-17 (×2): via INTRAVENOUS

## 2015-09-17 MED ORDER — KETAMINE HCL-SODIUM CHLORIDE 100-0.9 MG/10ML-% IV SOSY
PREFILLED_SYRINGE | INTRAVENOUS | Status: AC
  Filled 2015-09-17: qty 20

## 2015-09-17 MED ORDER — BUPIVACAINE-EPINEPHRINE (PF) 0.25% -1:200000 IJ SOLN
INTRAMUSCULAR | Status: AC
  Filled 2015-09-17: qty 30

## 2015-09-17 MED ORDER — THROMBIN 20000 UNITS EX KIT
PACK | CUTANEOUS | Status: DC | PRN
  Administered 2015-09-17: 20 mL via TOPICAL

## 2015-09-17 MED ORDER — INSULIN ASPART 100 UNIT/ML ~~LOC~~ SOLN: 0.0000 [IU] | Freq: Every day | SUBCUTANEOUS | Status: DC

## 2015-09-17 MED ORDER — PHENYLEPHRINE 40 MCG/ML (10ML) SYRINGE FOR IV PUSH (FOR BLOOD PRESSURE SUPPORT)
PREFILLED_SYRINGE | INTRAVENOUS | Status: AC
  Filled 2015-09-17: qty 10

## 2015-09-17 MED ORDER — DIAZEPAM 5 MG PO TABS
10.0000 mg | ORAL_TABLET | Freq: Three times a day (TID) | ORAL | Status: DC | PRN
  Administered 2015-09-17 – 2015-09-18 (×3): 10 mg via ORAL
  Filled 2015-09-17 (×3): qty 2

## 2015-09-17 MED ORDER — CEFAZOLIN SODIUM 1 G IJ SOLR
INTRAMUSCULAR | Status: AC
  Filled 2015-09-17: qty 20

## 2015-09-17 MED ORDER — SODIUM CHLORIDE 0.9 % IV SOLN: 250.0000 mL | INTRAVENOUS | Status: DC

## 2015-09-17 MED ORDER — SODIUM CHLORIDE 0.9% FLUSH
3.0000 mL | Freq: Two times a day (BID) | INTRAVENOUS | Status: DC
  Administered 2015-09-17 – 2015-09-22 (×7): 3 mL via INTRAVENOUS

## 2015-09-17 MED ORDER — PHENYLEPHRINE HCL 10 MG/ML IJ SOLN
INTRAMUSCULAR | Status: DC | PRN
  Administered 2015-09-17: 20 ug/min via INTRAVENOUS

## 2015-09-17 MED ORDER — POLYVINYL ALCOHOL 1.4 % OP SOLN
1.0000 [drp] | OPHTHALMIC | Status: DC | PRN
  Filled 2015-09-17: qty 15

## 2015-09-17 MED ORDER — PHENYLEPHRINE HCL 10 MG/ML IJ SOLN
INTRAMUSCULAR | Status: DC | PRN
  Administered 2015-09-17 (×2): 40 ug via INTRAVENOUS

## 2015-09-17 MED ORDER — METFORMIN HCL 500 MG PO TABS: 1000.0000 mg | ORAL_TABLET | Freq: Two times a day (BID) | ORAL | Status: DC

## 2015-09-17 MED ORDER — LUBIPROSTONE 24 MCG PO CAPS
24.0000 ug | ORAL_CAPSULE | Freq: Two times a day (BID) | ORAL | Status: DC
  Administered 2015-09-17 – 2015-09-22 (×10): 24 ug via ORAL
  Filled 2015-09-17 (×12): qty 1

## 2015-09-17 MED ORDER — MAGNESIUM CITRATE PO SOLN
1.0000 | Freq: Once | ORAL | Status: AC
  Administered 2015-09-17: 1 via ORAL
  Filled 2015-09-17: qty 296

## 2015-09-17 MED ORDER — PROMETHAZINE HCL 25 MG/ML IJ SOLN: 6.2500 mg | INTRAMUSCULAR | Status: DC | PRN

## 2015-09-17 MED ORDER — FUROSEMIDE 20 MG PO TABS
20.0000 mg | ORAL_TABLET | Freq: Every day | ORAL | Status: DC
  Administered 2015-09-18 – 2015-09-22 (×5): 20 mg via ORAL
  Filled 2015-09-17 (×5): qty 1

## 2015-09-17 MED ORDER — PROPOFOL 10 MG/ML IV BOLUS
INTRAVENOUS | Status: AC
  Filled 2015-09-17: qty 20

## 2015-09-17 MED ORDER — MIDAZOLAM HCL 2 MG/2ML IJ SOLN
INTRAMUSCULAR | Status: AC
  Filled 2015-09-17: qty 2

## 2015-09-17 MED ORDER — COLCHICINE 0.6 MG PO TABS
0.6000 mg | ORAL_TABLET | Freq: Every day | ORAL | Status: DC | PRN
  Administered 2015-09-20: 0.6 mg via ORAL
  Filled 2015-09-17 (×2): qty 1

## 2015-09-17 MED ORDER — LIDOCAINE 2% (20 MG/ML) 5 ML SYRINGE
INTRAMUSCULAR | Status: AC
  Filled 2015-09-17: qty 5

## 2015-09-17 SURGICAL SUPPLY — 90 items
AGENT HMST MTR 8 SURGIFLO (HEMOSTASIS) ×2
BLADE SURG ROTATE 9660 (MISCELLANEOUS) IMPLANT
BUR EGG ELITE 4.0 (BURR) ×1 IMPLANT
BUR EGG ELITE 4.0MM (BURR)
CLIP NEUROVISION LG (CLIP) ×2 IMPLANT
CLOSURE STERI-STRIP 1/2X4 (GAUZE/BANDAGES/DRESSINGS) ×2
CLOSURE WOUND 1/2 X4 (GAUZE/BANDAGES/DRESSINGS)
CLSR STERI-STRIP ANTIMIC 1/2X4 (GAUZE/BANDAGES/DRESSINGS) ×2 IMPLANT
CORDS BIPOLAR (ELECTRODE) ×3 IMPLANT
COVER MAYO STAND STRL (DRAPES) ×4 IMPLANT
COVER SURGICAL LIGHT HANDLE (MISCELLANEOUS) ×3 IMPLANT
DEVICE ENDSKLTN NANOLOCK 10 XL (Cage) IMPLANT
DRAPE C-ARM 42X72 X-RAY (DRAPES) ×3 IMPLANT
DRAPE POUCH INSTRU U-SHP 10X18 (DRAPES) ×3 IMPLANT
DRAPE SURG 17X23 STRL (DRAPES) ×3 IMPLANT
DRAPE U-SHAPE 47X51 STRL (DRAPES) ×3 IMPLANT
DRSG AQUACEL AG ADV 3.5X 6 (GAUZE/BANDAGES/DRESSINGS) ×4 IMPLANT
DRSG AQUACEL AG ADV 3.5X10 (GAUZE/BANDAGES/DRESSINGS) ×1 IMPLANT
DURAPREP 26ML APPLICATOR (WOUND CARE) ×3 IMPLANT
ELECT BLADE 4.0 EZ CLEAN MEGAD (MISCELLANEOUS) ×3
ELECT PENCIL ROCKER SW 15FT (MISCELLANEOUS) ×3 IMPLANT
ELECT REM PT RETURN 9FT ADLT (ELECTROSURGICAL) ×3
ELECTRODE BLDE 4.0 EZ CLN MEGD (MISCELLANEOUS) IMPLANT
ELECTRODE REM PT RTRN 9FT ADLT (ELECTROSURGICAL) ×1 IMPLANT
ENDOSKELETON NANOLOCK 10 XL (Cage) ×3 IMPLANT
GLOVE BIO SURGEON STRL SZ 6.5 (GLOVE) ×2 IMPLANT
GLOVE BIO SURGEONS STRL SZ 6.5 (GLOVE) ×1
GLOVE BIOGEL PI IND STRL 6.5 (GLOVE) ×1 IMPLANT
GLOVE BIOGEL PI IND STRL 8.5 (GLOVE) ×1 IMPLANT
GLOVE BIOGEL PI INDICATOR 6.5 (GLOVE) ×2
GLOVE BIOGEL PI INDICATOR 8.5 (GLOVE) ×2
GLOVE SS BIOGEL STRL SZ 8.5 (GLOVE) ×1 IMPLANT
GLOVE SUPERSENSE BIOGEL SZ 8.5 (GLOVE) ×2
GOWN STRL REUS W/ TWL XL LVL3 (GOWN DISPOSABLE) ×2 IMPLANT
GOWN STRL REUS W/TWL 2XL LVL3 (GOWN DISPOSABLE) ×6 IMPLANT
GOWN STRL REUS W/TWL XL LVL3 (GOWN DISPOSABLE) ×6
GUIDEWIRE NITINOL BEVEL TIP (WIRE) ×12 IMPLANT
KIT BASIN OR (CUSTOM PROCEDURE TRAY) ×3 IMPLANT
KIT ROOM TURNOVER OR (KITS) ×3 IMPLANT
LIGHT SOURCE ANGLE TIP STR 7FT (MISCELLANEOUS) ×2 IMPLANT
MODULE NVM5 NEXT GEN EMG (NEEDLE) ×2 IMPLANT
NDL I-PASS III (NEEDLE) IMPLANT
NDL SPNL 18GX3.5 QUINCKE PK (NEEDLE) ×1 IMPLANT
NEEDLE 22X1 1/2 (OR ONLY) (NEEDLE) ×3 IMPLANT
NEEDLE I-PASS III (NEEDLE) ×3 IMPLANT
NEEDLE SPNL 18GX3.5 QUINCKE PK (NEEDLE) ×6 IMPLANT
NS IRRIG 1000ML POUR BTL (IV SOLUTION) ×3 IMPLANT
PACK LAMINECTOMY ORTHO (CUSTOM PROCEDURE TRAY) ×3 IMPLANT
PACK UNIVERSAL I (CUSTOM PROCEDURE TRAY) ×3 IMPLANT
PAD ARMBOARD 7.5X6 YLW CONV (MISCELLANEOUS) ×8 IMPLANT
PATTIES SURGICAL .5 X.5 (GAUZE/BANDAGES/DRESSINGS) IMPLANT
PATTIES SURGICAL .5 X1 (DISPOSABLE) ×5 IMPLANT
PROBE BALL TIP NVM5 SNG USE (BALLOONS) ×2 IMPLANT
PUTTY BONE DBX 5CC MIX (Putty) ×2 IMPLANT
PUTTY DBX 1CC (Putty) ×3 IMPLANT
PUTTY DBX 1CC DEPUY (Putty) IMPLANT
REDUCTION EXT RELINE MAS MOD (Neuro Prosthesis/Implant) ×4 IMPLANT
ROD RELINE MAS 5.5X55MM LORD (Rod) ×4 IMPLANT
SCREW LOCK RELINE 5.5 TULIP (Screw) ×10 IMPLANT
SCREW RELINE MAS POLY 6.5X40MM (Screw) ×2 IMPLANT
SCREW RELINE RED 6.5X45MM POLY (Screw) ×2 IMPLANT
SCREW RELINE RED POLY 6.5X35MM (Screw) ×2 IMPLANT
SCREW SHANK RELINE MOD 7.5X35 (Screw) ×3 IMPLANT
SCREW SHANK RELINE MOD 7.5X45 (Screw) ×3 IMPLANT
SCREW SHANK RLINE MD 7.5X35 2C (Screw) IMPLANT
SCREW SHANK RLINE MD 7.5X45 2C (Screw) IMPLANT
SHEET CONFORM 45LX20WX5H (Bone Implant) ×2 IMPLANT
SPOGE SURGIFLO 8M (HEMOSTASIS) ×4
SPONGE LAP 4X18 X RAY DECT (DISPOSABLE) ×6 IMPLANT
SPONGE SURGIFLO 8M (HEMOSTASIS) IMPLANT
SPONGE SURGIFOAM ABS GEL 100 (HEMOSTASIS) ×3 IMPLANT
STRIP CLOSURE SKIN 1/2X4 (GAUZE/BANDAGES/DRESSINGS) ×1 IMPLANT
SURGIFLO W/THROMBIN 8M KIT (HEMOSTASIS) IMPLANT
SUT BONE WAX W31G (SUTURE) ×3 IMPLANT
SUT MNCRL AB 3-0 PS2 18 (SUTURE) ×2 IMPLANT
SUT MON AB 3-0 SH 27 (SUTURE) ×3
SUT MON AB 3-0 SH27 (SUTURE) ×1 IMPLANT
SUT STRATAFIX 1PDS 45CM VIOLET (SUTURE) ×2 IMPLANT
SUT VIC AB 1 CT1 18XCR BRD 8 (SUTURE) IMPLANT
SUT VIC AB 1 CT1 8-18 (SUTURE) ×6
SUT VIC AB 1 CTX 36 (SUTURE)
SUT VIC AB 1 CTX36XBRD ANBCTR (SUTURE) ×2 IMPLANT
SUT VIC AB 2-0 CT1 18 (SUTURE) ×5 IMPLANT
SYR BULB IRRIGATION 50ML (SYRINGE) ×3 IMPLANT
SYR CONTROL 10ML LL (SYRINGE) ×3 IMPLANT
TOWEL OR 17X24 6PK STRL BLUE (TOWEL DISPOSABLE) ×5 IMPLANT
TOWEL OR 17X26 10 PK STRL BLUE (TOWEL DISPOSABLE) ×3 IMPLANT
TRAY FOLEY CATH 16FRSI W/METER (SET/KITS/TRAYS/PACK) ×3 IMPLANT
WATER STERILE IRR 1000ML POUR (IV SOLUTION) ×1 IMPLANT
YANKAUER SUCT BULB TIP NO VENT (SUCTIONS) ×3 IMPLANT

## 2015-09-17 NOTE — Anesthesia Postprocedure Evaluation (Signed)
Anesthesia Post Note  Patient: Nancy [name]  Procedure(s) Performed: Procedure(s) (LRB): TRANSFORAMINAL LUMBAR INTERBODY FUSION (TLIF) WITH PEDICLE SCREW FIXATION 1 LEVEL , Lumbar 4-5 (N/A) FUSION POSTERIOR SPINAL MULTILEVEL L4- S1 (N/A)  Patient location during evaluation: PACU Anesthesia Type: General Level of consciousness: sedated Pain management: pain level controlled Vital Signs Assessment: post-procedure vital signs reviewed and stable Respiratory status: spontaneous breathing and respiratory function stable Cardiovascular status: stable Anesthetic complications: no    Last Vitals:  Vitals:   09/17/15 1415 09/17/15 1420  BP:  (!) 184/76  Pulse: 68 70  Resp: 16 14  Temp:                   Masaichi Kracht DANIEL

## 2015-09-17 NOTE — Brief Op Note (Signed)
09/17/2015  1:03 PM  PATIENT:  Nancy [name]  75 y.o. 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  PROCEDURE:  Procedure(s): TRANSFORAMINAL LUMBAR INTERBODY FUSION (TLIF) WITH PEDICLE SCREW FIXATION 1 LEVEL , Lumbar 4-5 (N/A) FUSION POSTERIOR SPINAL MULTILEVEL L4- S1 (N/A)  SURGEON:  Surgeon(s) and Role:    * Melina Schools, MD - Primary  PHYSICIAN ASSISTANT:   ASSISTANTS: Carmen Mayo   ANESTHESIA:   general  EBL:  Total I/O In: 2250 [I.V.:2000; IV Piggyback:250] Out: 900 [Urine:450; Blood:450]  BLOOD ADMINISTERED:none  DRAINS: none   LOCAL MEDICATIONS USED:  MARCAINE     SPECIMEN:  No Specimen  DISPOSITION OF SPECIMEN:  N/A  COUNTS:  YES  TOURNIQUET:  * No tourniquets in log *  DICTATION: .Other Dictation: Dictation Number [id number]  PLAN OF CARE: Admit to inpatient   PATIENT DISPOSITION:  PACU - hemodynamically stable.

## 2015-09-17 NOTE — Anesthesia Procedure Notes (Signed)
Procedure Name: Intubation Date/Time: 09/17/2015 8:48 AM Performed by: Jenne Campus Pre-anesthesia Checklist: Patient identified, Emergency Drugs available, Suction available and Patient being monitored Patient Re-evaluated:Patient Re-evaluated prior to inductionOxygen Delivery Method: Circle System Utilized Preoxygenation: Pre-oxygenation with 100% oxygen Intubation Type: IV induction Ventilation: Mask ventilation without difficulty Laryngoscope Size: Miller and 2 Grade View: Grade I Tube type: Oral Tube size: 7.0 mm Number of attempts: 1 Airway Equipment and Method: Stylet and Oral airway Placement Confirmation: ETT inserted through vocal cords under direct vision,  positive ETCO2 and breath sounds checked- equal and bilateral Secured at: 20 cm Tube secured with: Tape Dental Injury: Teeth and Oropharynx as per pre-operative assessment

## 2015-09-17 NOTE — Anesthesia Preprocedure Evaluation (Addendum)
Anesthesia Evaluation  Patient identified by MRN, date of birth, ID band Patient awake    Reviewed: Allergy & Precautions, NPO status , Patient's Chart, lab work & pertinent test results, reviewed documented beta blocker date and time   History of Anesthesia Complications (+) PROLONGED EMERGENCE and history of anesthetic complications  Airway Mallampati: II  TM Distance: >3 FB Neck ROM: Full    Dental  (+) Edentulous Upper, Dental Advisory Given   Pulmonary    Pulmonary exam normal        Cardiovascular hypertension, Pt. on medications and Pt. on home beta blockers  Rhythm:Regular Rate:Normal     Neuro/Psych    GI/Hepatic Neg liver ROS, PUD, GERD  Medicated and Controlled,  Endo/Other  diabetes, Type 2  Renal/GU negative Renal ROS  negative genitourinary   Musculoskeletal  (+) Arthritis ,   Abdominal   Peds negative pediatric ROS (+)  Hematology   Anesthesia Other Findings   Reproductive/Obstetrics negative OB ROS                           Anesthesia Physical Anesthesia Plan  ASA: III  Anesthesia Plan: General   Post-op Pain Management:    Induction: Intravenous  Airway Management Planned: Oral ETT  Additional Equipment:   Intra-op Plan:   Post-operative Plan: Extubation in OR  Informed Consent: I have reviewed the patients History and Physical, chart, labs and discussed the procedure including the risks, benefits and alternatives for the proposed anesthesia with the patient or authorized representative who has indicated his/her understanding and acceptance.   Dental advisory given  Plan Discussed with:   Anesthesia Plan Comments:         Anesthesia Quick Evaluation

## 2015-09-17 NOTE — Transfer of Care (Signed)
Immediate Anesthesia Transfer of Care Note  Patient: Nancy [name]  Procedure(s) Performed: Procedure(s): TRANSFORAMINAL LUMBAR INTERBODY FUSION (TLIF) WITH PEDICLE SCREW FIXATION 1 LEVEL , Lumbar 4-5 (N/A) FUSION POSTERIOR SPINAL MULTILEVEL L4- S1 (N/A)  Patient Location: PACU  Anesthesia Type:General  Level of Consciousness: awake, alert , patient cooperative and responds to stimulation  Airway & Oxygen Therapy: Patient Spontanous Breathing and Patient connected to face mask oxygen  Post-op Assessment: Report given to RN, Post -op Vital signs reviewed and stable and Patient moving all extremities X 4  Post vital signs: Reviewed and stable  Last Vitals:  Vitals:   09/17/15 0733 09/17/15 0746  BP:  (!) 199/79  Pulse: 72   Resp: 20   Temp: 36.9 C     Last Pain:  Vitals:   09/17/15 0733  TempSrc: Oral         Complications: No apparent anesthesia complications

## 2015-09-17 NOTE — H&P (Signed)
History of Present Illness  The patient is a 75 year old female who comes in today for a preoperative History and Physical. The patient is scheduled for a TLIF L4-5, PSF L4-S1, poss interbody fusion L5-S1 to be performed by Dr. Duane Lope D. Rolena Infante, MD at [facility] on 09/17/15 . Please see the hospital record for complete dictated history and physical.  Transition into care is described as the following: The patient is transitioning into care from another physician (PCP for pre op clearance) and a summary of care was reviewed.   Problem List/Past Medical  Lumbar spinal stenosis (M48.07)  Lumbar radiculopathy (M54.16)  Problems Reconciled   Allergies CODEINE [11/02/2005]: Allergies Reconciled   Family History Heart Disease  mother Osteoarthritis  sister Rheumatoid Arthritis  sister  Social History Tobacco use  never smoker Living situation  live alone Illicit drug use  no Marital status  divorced Alcohol use  current drinker; drinks wine; only occasionally per week Number of flights of stairs before winded  4-5 Exercise  Exercises rarely; does other Children  3 Current work status  retired Engineer, agricultural (Previously)  no Drug/Alcohol Rehab (Currently)  no  Medication History  Meloxicam (7.5MG  Tablet, Oral) Active. (qd) Simvastatin (20MG  Tablet, Oral) Active. (qd) Gabapentin (300MG  Capsule, Oral) Active. (tid) TraZODone HCl (50MG  Tablet, Oral) Active. (qhs prn) Refresh Optive Advanced (Ophthalmic) Specific strength unknown - Active. Aspirin Child (81MG  Tablet Chewable, Oral) Active. (qd "i haven't taken it lately") PriLOSEC (20MG  Capsule DR, Oral) Active. (qd) MetFORMIN HCl (500MG  Tablet, Oral) Active. (bid) GlyBURIDE (5MG  Tablet, Oral) Active. (bid) Metoprolol Succinate (100MG  Tablet ER, Oral) Active. (qd) Lisinopril (40MG  Tablet, Oral) Active. (qd) AmLODIPine Besylate (10MG  Tablet, Oral) Active. (qd) Allopurinol (100MG  Tablet,  Oral) Active. (#2bid) Colcrys (0.6MG  Tablet, Oral) Active. (prn) Hydrocodone-Acetaminophen (7.5-325MG  Tablet, Oral) Active. (prn "for my back , feet and knees" PCP Rx's) Medications Reconciled  Vitals 09/09/2015 8:09 AM Weight: 180 lb Height: 61.5in Body Surface Area: 1.82 m Body Mass Index: 33.46 kg/m  Temp.: 98.67F  Pulse: 96 (Regular)  BP: 142/82 (Sitting, Left Arm, Standard)  General General Appearance-Not in acute distress. Orientation-Oriented X3. Build & Nutrition-Well nourished and Well developed.  Integumentary General Characteristics Surgical Scars - no surgical scar evidence of previous lumbar surgery. Lumbar Spine-Skin examination of the lumbar spine is without deformity, skin lesions, lacerations or abrasions.  Chest and Lung Exam Auscultation Breath sounds - Normal and Clear.  Cardiovascular Auscultation Rhythm - Regular rate and rhythm.  Abdomen Palpation/Percussion Palpation and Percussion of the abdomen reveal - Soft, Non Tender and No Rebound tenderness.  Peripheral Vascular Lower Extremity Palpation - Posterior tibial pulse - Bilateral - 1+. Dorsalis pedis pulse - Bilateral - 1+.  Neurologic Sensation Lower Extremity - Bilateral - sensation is diminished in the lower extremity. Reflexes Patellar Reflex - Bilateral - 2+. Achilles Reflex - Bilateral - 2+. Clonus - Bilateral - clonus not present. Hoffman's Sign - Bilateral - Hoffman's sign not present. Testing Seated Straight Leg Raise - Bilateral - Seated straight leg raise negative.  Musculoskeletal Spine/Ribs/Pelvis  Lumbosacral Spine: Inspection and Palpation - Tenderness - left lumbar paraspinals tender to palpation and right lumbar paraspinals tender to palpation. Strength and Tone: Strength - Hip Flexion - Bilateral - 5/5. Knee Extension - Bilateral - 5/5. Knee Flexion - Bilateral - 5/5. Ankle Dorsiflexion - Left - 4+/5. Right - 5/5. Ankle Plantarflexion - Left - 4+/5.  Right - 5/5. Heel walk - Bilateral - able to heel walk with moderate difficulty. Toe Walk -  Bilateral - able to walk on toes with moderate difficulty. Heel-Toe Walk - Bilateral - able to heel-toe walk with moderate difficulty. ROM - Flexion - moderately decreased range of motion and painful. Extension - moderately decreased range of motion and painful. Left Lateral Bending - moderately decreased range of motion and painful. Right Lateral Bending - moderately decreased range of motion and painful. Right Rotation - moderately decreased range of motion and painful. Left Rotation - moderately decreased range of motion and painful. Pain - . Lumbosacral Spine - Waddell's Signs - no Waddell's signs present. Lower Extremity Range of Motion - No true hip, knee or ankle pain with range of motion. Gait and Station - [employer] - cane.   IMAGING MRI from 07/30/2015 was reviewed. She has severe spinal stenosis grade I, borderline II spondylolisthesis at L4-5. There is a left disc herniation at L5-S1 with advanced degenerative disc disease L5-S1.  Assessment & Plan   Posterior decompression/Fusion:Risks of surgery include infection, bleeding, nerve damage, death, stroke, paralysis, failure to heal, need for further surgery, ongoing or worse pain, need for further surgery, CSF leak, loss of bowel or bladder, and recurrent disc herniation or stenosis which would necessitate need for further surgery. Non-union, hardware failure, adjacent segment disease and recurrent pain. Hardware breakage, mal-position requiring surgery to correct or remove.  Goal Of Surgery: Discussed that goal of surgery is to reduce pain and improve function and quality of life. Patient is aware that despite all appropriate treatment that there pain and function could be the same, worse, or different.  Plans Transcription At this point in time because of the ongoing loss in quality of life and pain and the failure to improve with  physical therapy, ejection therapy and activity modification, she would like to proceed with surgery. To address this, I think the best course of action is posterior transforaminal lumbar interbody fusion at L4-5, posterior decompression at L5-S1 with extension of the fusion down to S1. There is no significant disc space at L5-S1 and I think she may benefit from a fusion to address the discogenic portion of her pain. She would also need a decompression to address that disc herniation. Depending upon the ability to open up that disc space, I would either do a posterolateral arthrodesis or an interbody fusion as well. I have explained the risks and benefits of surgery to the patient. All of her questions were addressed. We will go ahead and get preoperative medical clearance from her primary care physician and then move forward with surgery in a timely fashion.

## 2015-09-18 ENCOUNTER — Inpatient Hospital Stay (HOSPITAL_COMMUNITY): Payer: Medicare Other

## 2015-09-18 ENCOUNTER — Encounter (HOSPITAL_COMMUNITY): Payer: Self-pay | Admitting: Orthopedic Surgery

## 2015-09-18 LAB — GLUCOSE, CAPILLARY
GLUCOSE-CAPILLARY: 233 mg/dL — AB (ref 65–99)
GLUCOSE-CAPILLARY: 193 mg/dL — AB (ref 65–99)
Glucose-Capillary: 214 mg/dL — ABNORMAL HIGH (ref 65–99)
Glucose-Capillary: 186 mg/dL — ABNORMAL HIGH (ref 65–99)
Glucose-Capillary: 151 mg/dL — ABNORMAL HIGH (ref 65–99)

## 2015-09-18 MED ORDER — CHLORHEXIDINE GLUCONATE CLOTH 2 % EX PADS
6.0000 | MEDICATED_PAD | Freq: Every day | CUTANEOUS | Status: AC
  Administered 2015-09-18 – 2015-09-22 (×5): 6 via TOPICAL

## 2015-09-18 NOTE — Evaluation (Signed)
Occupational Therapy Evaluation Patient Details Name: Nancy [name] MRN: [id number] DOB: 09/29/1940 Today's Date: 09/18/2015    History of Present Illness Patient is a 75 y/o female with hx of DM, HTN, HLD, diastolic dysfunction present s/p L4-5 TLIF, posterior fusion L5-S1.   Clinical Impression   PTA, pt was independent with ADLs and occasionally used RW for mobility. Pt currently requires min assist for seated LB ADLs and min guard assist for basic transfers. Reviewed back precautions, brace wear protocol, compensatory strategies for LB ADLs, and use of AE. Pt plans to d/c home with 24/7 assistance from multiple family members. Pt will benefit from continued acute OT to increase independence and safety with ADLs and mobility to allow for safe discharge home. Recommend 3in1 for home use.    Follow Up Recommendations  No OT follow up;Supervision/Assistance - 24 hour    Equipment Recommendations  3 in 1 bedside comode    Recommendations for Other Services       Precautions / Restrictions Precautions Precautions: Back;Fall Precaution Booklet Issued: Yes (comment) Precaution Comments: Pt able to recall 2/3 back precautions at beginning of session. Reviewed precautions and handout Required Braces or Orthoses: Spinal Brace Spinal Brace: Lumbar corset;Applied in sitting position Restrictions Weight Bearing Restrictions: No      Mobility Bed Mobility Overal bed mobility: Needs Assistance Bed Mobility: Rolling;Sidelying to Sit;Sit to Sidelying Rolling: Supervision Sidelying to sit: Min assist     Sit to sidelying: Supervision General bed mobility comments: Assist for trunk support to come to sitting position. HOB flat, no use of bedrails to simulate home environment.  Transfers Overall transfer level: Needs assistance Equipment used: Rolling walker (2 wheeled) Transfers: Sit to/from Stand Sit to Stand: Min guard         General transfer comment: Initial stand, pt  required min guard assist due to weakness in BLE. Subsequent transfers required supervision. VCs for safe hand placement and proper RW use.    Balance Overall balance assessment: Needs assistance Sitting-balance support: No upper extremity supported;Feet supported Sitting balance-Leahy Scale: Good     Standing balance support: No upper extremity supported;During functional activity Standing balance-Leahy Scale: Fair Standing balance comment: Able to maintain balance without UE support for static standing tasks only                            ADL Overall ADL's : Needs assistance/impaired Eating/Feeding: Set up;Sitting   Grooming: Wash/dry hands;Wash/dry face;Supervision/safety;Standing   Upper Body Bathing: Supervision/ safety;Sitting   Lower Body Bathing: Minimal assistance;Sit to/from stand;Cueing for compensatory techniques Lower Body Bathing Details (indicate cue type and reason): educated on use of long-handled sponge Upper Body Dressing : Supervision/safety;Sitting   Lower Body Dressing: Minimal assistance;Sit to/from stand;Cueing for compensatory techniques Lower Body Dressing Details (indicate cue type and reason): unable to reach feet or cross ankle-over-knee; daughter can assist Toilet Transfer: Supervision/safety;Cueing for safety;BSC;RW   Toileting- Clothing Manipulation and Hygiene: Supervision/safety;Sit to/from stand Toileting - Clothing Manipulation Details (indicate cue type and reason): educated on use of wet wipes     Functional mobility during ADLs: Supervision/safety;Rolling walker       Vision Vision Assessment?: No apparent visual deficits   Perception     Praxis      Pertinent Vitals/Pain Pain Assessment: 0-10 Pain Score: 6  Pain Location: back Pain Descriptors / Indicators: Aching;Sore Pain Intervention(s): Limited activity within patient's tolerance;Monitored during session;Premedicated before session;Repositioned     Hand  Dominance Right  Extremity/Trunk Assessment Upper Extremity Assessment Upper Extremity Assessment: Overall WFL for tasks assessed   Lower Extremity Assessment Lower Extremity Assessment: Defer to PT evaluation   Cervical / Trunk Assessment Cervical / Trunk Assessment: Other exceptions Cervical / Trunk Exceptions: s/p lumbar surgery   Communication Communication Communication: No difficulties   Cognition Arousal/Alertness: Awake/alert Behavior During Therapy: WFL for tasks assessed/performed Overall Cognitive Status: Within Functional Limits for tasks assessed                     General Comments       Exercises       Shoulder Instructions      Home Living Family/patient expects to be discharged to:: Private residence Living Arrangements: Children;Other relatives Available Help at Discharge: Family;Available 24 hours/day Type of Home: House Home Access: Stairs to enter [employer] of Steps: 1   Home Layout: Two level;Able to live on main level with bedroom/bathroom     Bathroom Shower/Tub: Occupational psychologist: Standard     Home Equipment: Shower seat - built in;Walker - 2 wheels;Cane - single point          Prior Functioning/Environment Level of Independence: Independent with assistive device(s)  Gait / Transfers Assistance Needed: Uses sister's RW as needed          OT Diagnosis: Generalized weakness;Acute pain   OT Problem List: Decreased strength;Impaired balance (sitting and/or standing);Decreased safety awareness;Decreased knowledge of precautions;Decreased knowledge of use of DME or AE;Pain   OT Treatment/Interventions: Self-care/ADL training;Therapeutic exercise;DME and/or AE instruction;Therapeutic activities;Patient/family education;Balance training    OT Goals(Current goals can be found in the care plan section) Acute Rehab OT Goals Patient Stated Goal: to stop hurting OT Goal Formulation: With patient Time  For Goal Achievement: 10/02/15 Potential to Achieve Goals: Good ADL Goals Pt Will Perform Tub/Shower Transfer: Shower transfer;with supervision;ambulating;shower seat;rolling walker Additional ADL Goal #1: Pt will don/doff back brace independently. Additional ADL Goal #2: Pt will demonstrate adherence to 3/3 back precautions with min verbal cues.  OT Frequency: Min 2X/week   Barriers to D/C:            Co-evaluation              End of Session Equipment Utilized During Treatment: Back brace;Gait belt;Rolling walker Nurse Communication: Mobility status  Activity Tolerance: Patient tolerated treatment well Patient left: in bed;with call bell/phone within reach   Time: 1121-1145 OT Time Calculation (min): 24 min Charges:  OT General Charges $OT Visit: 1 Procedure OT Evaluation $OT Eval Moderate Complexity: 1 Procedure OT Treatments $Self Care/Home Management : 8-22 mins G-Codes:    Redmond Baseman, OTR/L Pager: [phone] 09/18/2015, 1:41 PM

## 2015-09-18 NOTE — Progress Notes (Signed)
OT Cancellation Note  Patient Details Name: Nancy [name] MRN: [id number] DOB: [birth year]   Cancelled Treatment:    Reason Eval/Treat Not Completed: Patient at procedure or test/ unavailable. (Echo)  Redmond Baseman, OTR/L Pager: [phone] 09/18/2015, 8:07 AM

## 2015-09-18 NOTE — Op Note (Signed)
Nancy [name], Nancy [name]           ACCOUNT NO.:  [id number]  MEDICAL RECORD NO.:  [id number]  LOCATION:  [id number]                        FACILITY:  [facility]  PHYSICIAN:  Vedanth Sirico D. Rolena Infante, M.D. DATE OF BIRTH:  1940-02-19  DATE OF PROCEDURE:  09/17/2015 DATE OF DISCHARGE:                              OPERATIVE REPORT   PREOPERATIVE DIAGNOSES: 1. Degenerative lumbar spondylolisthesis, L4-L5 with neuropathic left     leg pain. 2. L5-S1 degenerative disk disease with left foraminal and lateral     recess stenosis.  POSTOPERATIVE DIAGNOSES: 1. Degenerative lumbar spondylolisthesis, L4-L5 with neuropathic left     leg pain. 2. L5-S1 degenerative disk disease with left foraminal and lateral     recess stenosis.  OPERATIVE PROCEDURES: 1. Transforaminal lumbar interbody fusion, L4-L5. 2. Posterior arthrodesis L5-S1 with local bone and allograft DBX mix. 3. Posterior pedicle screw fixation, L5-S1. 4. Posterior fusion instrumentation L4-S1.  HISTORY:  This is a very pleasant woman who has been having progressive debilitating back, buttock, and neuropathic left leg pain.  Attempts at conservative management have failed to alleviate her symptoms, and the overall quality of her life has continued to deteriorate.  After discussing all treatment options and having failed conservative management, she elected to proceed with surgery.  All appropriate risks, benefits, and alternatives were discussed with the patient and consent was obtained.  OPERATIVE NOTE:  The patient was brought to the operating room and placed supine on the operating room table.  After successful induction of general anesthesia and endotracheal intubation, TEDs, SCDs, and Foley were inserted and she was turned prone onto the Wilson frame.  All bony prominences were well padded.  The back was prepped and draped in a standard fashion.  Time-out was taken to confirm patient, procedure, and all other pertinent important data.   Once this was completed, a Wiltse incision was made on the right-hand side.  After identifying the lateral borders of the L4-L5 and S1 pedicles on x-ray, Jamshidi needle was advanced percutaneously down to the level of the lateral aspect of the facet in the midportion of the transverse process.  Once I confirmed satisfactory position in both the AP and lateral planes, I advanced the Jamshidi needle while stimulating it into the L4 pedicle.  Once I had neared the medial wall of the L4 pedicle, I then switched to the lateral view.  I confirmed that I was just beyond the posterior wall of the vertebral body.  Confirming that I had taken a true transpedicular approach.  Also there were no abnormal EMG of free runs during the cannulation portion.  Once I advanced it down into the vertebral body, I placed my guide pin to cannulate the pedicle.  I repeated this exact same procedure at the right L5 and right S1 pedicles.  Once all 3 pedicles were cannulated, I then tapped and placed a 45-mm length NuVasive 6.5 diameter pedicle screw at L4, a 40-mm length screw at L5, and a 35-mm screw at S1.  All screws had excellent purchase.  I then directly stimulated each screw, and there were no adverse free runs at 40 milliamps of stimulation.  At this point, I went to the left-hand side, which was the symptomatic  side.  I again identified the lateral border of the pedicles, made Wiltse incision, sharply dissected down to the deep fascia.  I then advanced Jamshidi needle, and using the same technique I had used on the contralateral side, I cannulated the L4, the L5, and the S1 pedicles. At this point, I elected not to place the L5 screw since it would block by visualization, and I did not think it was adding stability to the overall construct.  I then placed 7.5-mm diameter screws 45-mm length at L4, 35-mm length at S1.  All screws had excellent purchase.  I again directly stimulated the 2 screws and there  were no adverse events at 40 milliamps.  At this point, I then assembled my retracting device and stripped the paraspinal muscles to expose the lateral border of the L4 and L5 spinous processes.  A retracting system was built and I could not clearly visualize the L4-L5 and L5-S1 levels.  At this point, a Cobb osteotome was used to resect the inferior L4 facet in its entirety.  I then used 3-mm Kerrison rongeur to perform a very generous laminotomy of L4.  At this point, I now had a good exposure of the very thickened and almost calcified ligamentum flavum.  Using a Penfield 4, I dissected through the ligamentum flavum until I could create a plane between the thecal sac and the ligamentum flavum.  Using the Kerrison rongeur, I resected the ligamentum flavum to expose the posterolateral aspect of the thecal sac.  I continued my dissection into the lateral recess removing the medial osteophytes from the L5 superior facet and resecting the entire L4 pars.  Once this was done, I could now clearly visualize the L4 nerve root in the foramen, the L4-L5 disk space, and the L5 nerve root as it was passing the medial-inferior aspect of the pedicle.  I then used a neural patty to retract and protect the nerve roots and then incised the annulus with a 15-blade scalpel.  Using pituitary rongeurs, reverse cutting curettes, and Kerrison rongeurs, I resected all of the disk material at this level. Once I had an adequate diskectomy, I then irrigated the wound copiously with normal saline.  I then placed my autograft conform sheet along the anterior anulus.  I then measured and elected to use a size 10 Titan extra long intervertebral TLIF cage, packed with autograft and DBX.  I then malleted this down into a vertical position and then kicked it across to the other side, so it was horizontal in the anterior third of the disk space.  It was well fitting.  At this point, I then went repositioned my  retractors.  I could better visualize the L5-S1 level. I then used a Kerrison rongeur to begin my laminotomy of L5.  I then dissected through the ligamentum flavum with Penfield 4 and then resected the medial overhanging osteophytes to completely decompress the lateral recess.  At this point, I passed my neural patty underneath the remaining portion of the L5 lamina.  With the thecal sac protected, I used my 3-mm Kerrison rongeur to complete my L5 laminectomy.  Once this was done, I then traced the L5 nerve root into its foramen confirming an adequate decompression as well as the S1.  I then obtained hemostasis using bipolar electrocautery and FloSeal. Because of the significant thickening of the ligamentum flavum, I elected to remove this from the right-hand side at L4-L5.  Working from the left across the midline, I protected the  thecal sac and used my 2-mm Kerrison punch to resect the right-sided ligamentum flavum and add to my adequate decompression.  Once I was over into the lateral recess, I could visualize the L5 nerve root in the lateral recess confirming a satisfactory right-sided posterior lateral decompression.  At this point, I then decorticated the L5-S1 facet complex, and then using a large curette, I decorticated the lateral aspect of the facet at L4-L5 and L5-S1.  I then placed the bone graft in the posterior lateral gutter at the L5-S1 level.  At this point, I irrigated, confirmed hemostasis, and closed with interrupted #1 Vicryl sutures, 2-0 Vicryl sutures, and 3-0 Monocryl.  I then went to the contralateral side, measured and placed the same size 55-mm length rod at this level.  I then placed bone graft in the posterior lateral gutter at the L5-S1 level and then placed my locking caps in and secured the right-sided rod.  All screws were torqued off. Again, this wound was irrigated and closed in similar fashion.  Final x-rays were satisfactory.  Steri-Strips dry  dressings were applied.  The patient was ultimately extubated, transferred to PACU without incident.  At the end of the case, all needle and sponge counts were correct.     Nyquan Selbe D. Rolena Infante, M.D.     DDB/MEDQ  D:  09/17/2015  T:  09/18/2015  Job:  [id number]

## 2015-09-18 NOTE — Progress Notes (Signed)
Bed request to transfer  patient to 81M approved for patient safety. Patient alert and oriented, very unsteady, needs 1-2 person assist with walker, has the tendency to get up by herself per family, appreciate patient transfer to another unit equipped with bed alarm. Foley d/cd and patient  Is  voiding, had one incontinent episode prior to transfer. Family members with patient, very supportive. Surgical site with dressing CDI. House supervisor aware of transfer, SWAT RN came to help, help appreciated.. Will follow up.

## 2015-09-18 NOTE — Progress Notes (Signed)
Patient ID: Nancy [name], female   DOB: [birth year], 75 y.o.   MRN: [id number]    Subjective: 1 Day Post-Op Procedure(s) (LRB): TRANSFORAMINAL LUMBAR INTERBODY FUSION (TLIF) WITH PEDICLE SCREW FIXATION 1 LEVEL , Lumbar 4-5 (N/A) FUSION POSTERIOR SPINAL MULTILEVEL L4- S1 (N/A) Patient reports pain as 5 on 0-10 scale.   Denies CP or SOB.  Pt has not voided since foley was removed this am. Positive flatus. Denies Nausea.  reports decreased leg pain Objective: Vital signs in last 24 hours: Temp:  [98 F (36.7 C)-99.6 F (37.6 C)] 99.6 F (37.6 C) (09/07 0400) Pulse Rate:  [64-89] 89 (09/07 0400) Resp:  [14-24] 18 (09/07 0400) BP: (155-199)/(49-104) 171/68 (09/07 0400) SpO2:  [95 %-100 %] 98 % (09/07 0400)  Intake/Output from previous day: 09/06 0701 - 09/07 0700 In: 2500 [I.V.:2250; IV Piggyback:250] Out: 2975 [Urine:2525; Blood:450] Intake/Output this shift: No intake/output data recorded.  Labs: No results for input(s): HGB in the last 72 hours. No results for input(s): WBC, RBC, HCT, PLT in the last 72 hours. No results for input(s): NA, K, CL, CO2, BUN, CREATININE, GLUCOSE, CALCIUM in the last 72 hours. No results for input(s): LABPT, INR in the last 72 hours.  Physical Exam: Neurologically intact ABD soft Sensation intact distally Dorsiflexion/Plantar flexion intact Incision: no drainage Compartment soft  Assessment/Plan: 1 Day Post-Op Procedure(s) (LRB): TRANSFORAMINAL LUMBAR INTERBODY FUSION (TLIF) WITH PEDICLE SCREW FIXATION 1 LEVEL , Lumbar 4-5 (N/A) FUSION POSTERIOR SPINAL MULTILEVEL L4- S1 (N/A) Advance diet Up with therapy  [place], Darla Lesches for Dr. Melina Schools [facility] [phone] 09/18/2015, 7:36 AM

## 2015-09-18 NOTE — Evaluation (Signed)
Physical Therapy Evaluation Patient Details Name: Nancy Nancy [name] MRN: [id number] DOB: [birth year] Today's Date: 09/18/2015   History of Present Illness  Patient is Nancy [name] 75 y/o female with hx of DM, HTN, HLD, diastolic dysfunction present s/p L4-5 TLIF, posterior fusion L5-S1.  Clinical Impression  Patient presents with pain and post surgical deficits s/p above surgery. Education on back precautions, brace, positioning, activity progression and log roll technique. Increased time to perform all mobility. Requires encouragement for mobility. Tolerated transfers and gait training with min Nancy [name] for balance/safety. Will follow acutely to maximize independence and mobility prior to return home. Pt will have support of daughter at d/c.    Follow Up Recommendations Home health PT;Supervision for mobility/OOB    Equipment Recommendations  Rolling walker with 5" wheels;3in1 (PT)    Recommendations for Other Services OT consult     Precautions / Restrictions Precautions Precautions: Back Precaution Booklet Issued: No Precaution Comments: Reviewed back precautions. Required Braces or Orthoses: Spinal Brace Spinal Brace: Lumbar corset Restrictions Weight Bearing Restrictions: No      Mobility  Bed Mobility Overal bed mobility: Needs Assistance Bed Mobility: Rolling;Sidelying to Sit Rolling: Min guard Sidelying to sit: Min assist;HOB elevated       General bed mobility comments: Cues for log roll technique. Increased time for mobility. No dizziness.  Transfers Overall transfer level: Needs assistance Equipment used: Rolling walker (2 wheeled) Transfers: Sit to/from Stand Sit to Stand: Min assist         General transfer comment: Assist toboost to standing with cues for hand placement/technique. Increased time. transferred to chair post ambulation bout.  Ambulation/Gait Ambulation/Gait assistance: Min assist Ambulation Distance (Feet): 50 Feet Assistive device: Rolling walker (2  wheeled) Gait Pattern/deviations: Step-to pattern;Step-through pattern;Decreased stride length;Trunk flexed Gait velocity: decreased Gait velocity interpretation: Below normal speed for age/gender General Gait Details: Very slow, unsteady gait with multiple short standing rest breaks due to arms fatiguing. Cues to keep eyes opened.   Stairs            Wheelchair Mobility    Modified Rankin (Stroke Patients Only)       Balance Overall balance assessment: Needs assistance Sitting-balance support: Feet supported;Single extremity supported Sitting balance-Leahy Scale: Fair     Standing balance support: During functional activity Standing balance-Leahy Scale: Poor Standing balance comment: Reliant on BUEs for support.                             Pertinent Vitals/Pain Pain Assessment: Faces Faces Pain Scale: Hurts whole lot Pain Location: back Pain Descriptors / Indicators: Sore;Operative site guarding Pain Intervention(s): Monitored during session;Repositioned;Patient requesting pain meds-RN notified;Limited activity within patient's tolerance    Home Living Family/patient expects to be discharged to:: Private residence Living Arrangements: Children;Other relatives Available Help at Discharge: Family;Available 24 hours/day Type of Home: House Home Access: Stairs to enter   [employer] of Steps: 1 Home Layout: Two level;Able to live on main level with bedroom/bathroom Home Equipment: Shower seat      Prior Function Level of Independence: Needs assistance   Gait / Transfers Assistance Needed: Uses sister's RW as needed.            Hand Dominance        Extremity/Trunk Assessment   Upper Extremity Assessment: Defer to OT evaluation           Lower Extremity Assessment: Generalized weakness         Communication  Communication: No difficulties  Cognition Arousal/Alertness: Awake/alert Behavior During Therapy: WFL for tasks  assessed/performed Overall Cognitive Status: Within Functional Limits for tasks assessed                      General Comments      Exercises        Assessment/Plan    PT Assessment Patient needs continued PT services  PT Diagnosis Difficulty walking;Acute pain;Generalized weakness   PT Problem List Decreased strength;Decreased mobility;Decreased activity tolerance;Decreased balance;Pain;Decreased knowledge of precautions  PT Treatment Interventions DME instruction;Therapeutic activities;Gait training;Therapeutic exercise;Patient/family education;Balance training;Functional mobility training;Stair training   PT Goals (Current goals can be found in the Care Plan section) Acute Rehab PT Goals Patient Stated Goal: to stop hurting PT Goal Formulation: With patient Time For Goal Achievement: 10/02/15 Potential to Achieve Goals: Fair    Frequency Min 5X/week   Barriers to discharge        Co-evaluation               End of Session Equipment Utilized During Treatment: Gait belt;Back brace Activity Tolerance: Patient limited by pain;Patient limited by fatigue Patient left: in chair;with call bell/phone within reach Nurse Communication: Mobility status         Time: [id number] PT Time Calculation (min) (ACUTE ONLY): 27 min   Charges:   PT Evaluation $PT Eval Moderate Complexity: 1 Procedure PT Treatments $Gait Training: 8-22 mins   PT G Codes:        Nancy Nancy [name] Nancy Nancy [name] 09/18/2015, 9:25 AM Nancy Nancy [name], PT, DPT [phone]

## 2015-09-19 LAB — GLUCOSE, CAPILLARY
GLUCOSE-CAPILLARY: 173 mg/dL — AB (ref 65–99)
GLUCOSE-CAPILLARY: 129 mg/dL — AB (ref 65–99)
Glucose-Capillary: 209 mg/dL — ABNORMAL HIGH (ref 65–99)
Glucose-Capillary: 181 mg/dL — ABNORMAL HIGH (ref 65–99)

## 2015-09-19 MED ORDER — DIAZEPAM 5 MG PO TABS: 5.0000 mg | ORAL_TABLET | Freq: Three times a day (TID) | ORAL | Status: DC | PRN

## 2015-09-19 MED FILL — Thrombin For Soln 20000 Unit: CUTANEOUS | Qty: 1 | Status: AC

## 2015-09-19 NOTE — Progress Notes (Signed)
Patient ID: Nancy [name], female   DOB: 1940/08/13, 75 y.o.   MRN: [id number] Physician Discharge Summary  Patient ID: Nancy [name] MRN: [id number] DOB/AGE: 1940-09-18 75 y.o.  Admit date: 09/17/2015 Discharge date: 09/19/2015  Admission Diagnoses:  Degenerative Lumbar Spondylolisthesis, LLE pain  Discharge Diagnoses:  Active Problems:   Back pain   Past Medical History:  Diagnosis Date  . Breast cyst    left breast  . Chest pain   . Complication of anesthesia 1986   slow to awaken after cholecystectomy  . Diastolic dysfunction    with mild pulmonary HTN by echo 07/2011  . Gastric ulcer   . GERD (gastroesophageal reflux disease)   . Gout   . Hyperlipidemia   . Hypertension   . Osteoarthritis   . Osteopenia   . Shortness of breath dyspnea    with activity  . Spine deformity    lower spine  . Type II diabetes mellitus (HCC)     Surgeries: Procedure(s): TRANSFORAMINAL LUMBAR INTERBODY FUSION (TLIF) WITH PEDICLE SCREW FIXATION 1 LEVEL , Lumbar 4-5 FUSION POSTERIOR SPINAL MULTILEVEL L4- S1 on 09/17/2015   Consultants (if any):   Discharged Condition: Improved  Hospital Course: Nancy [name] is an 75 y.o. female who was admitted 09/17/2015 with a diagnosis of degenerative Lumbar Spondylisthesis, LLE pain and went to the operating room on 09/17/2015 and underwent the above named procedures. Decreased leg pain. Incisional back pain present. On day 1 post op she was having difficulty getting out of bed and ambulation because of pain.  Post op day 2,  She reports 5/10 pain laying in the bed. Decreased leg pain.  Continued incisional pain.  Pt urinating w/o difficulty.  Pain is limiting the pts ability to participate in PT.  Dr Rolena Infante referred the pt for a SW consult to consider rehab facility placement.   She was given perioperative antibiotics:  Anti-infectives    Start     Dose/Rate Route Frequency Ordered Stop   09/17/15 2100  ceFAZolin (ANCEF) IVPB 1 g/50 mL premix      1 g 100 mL/hr over 30 Minutes Intravenous Every 8 hours 09/17/15 1603 09/18/15 0455   09/17/15 0800  ceFAZolin (ANCEF) IVPB 2g/100 mL premix     2 g 200 mL/hr over 30 Minutes Intravenous To ShortStay [facility] 09/16/15 1253 09/17/15 1256    .  She was given sequential compression devices, early ambulation, and TED for DVT prophylaxis.  She benefited maximally from the hospital stay and there were no complications.    Recent vital signs:  Vitals:   09/19/15 0046 09/19/15 0902  BP: (!) 157/69 (!) 155/51  Pulse: (!) 106 100  Resp: 18 20  Temp: 100 F (37.8 C) 99.7 F (37.6 C)    Recent laboratory studies:  Lab Results  Component Value Date   HGB 11.4 (L) 09/10/2015   HGB 12.6 07/09/2011   HGB 12.2 12/19/2008   Lab Results  Component Value Date   WBC 6.7 09/10/2015   PLT 245 09/10/2015   No results found for: INR Lab Results  Component Value Date   NA 139 09/10/2015   K 3.4 (L) 09/10/2015   CL 107 09/10/2015   CO2 23 09/10/2015   BUN 8 09/10/2015   CREATININE 0.82 09/10/2015   GLUCOSE 174 (H) 09/10/2015    Discharge Medications:     Medication List    STOP taking these medications   gabapentin 300 MG capsule Commonly known as:  NEURONTIN  HYDROcodone-acetaminophen 5-325 MG tablet Commonly known as:  NORCO   meloxicam 7.5 MG tablet Commonly known as:  MOBIC   NONFORMULARY OR COMPOUNDED ITEM   predniSONE 20 MG tablet Commonly known as:  DELTASONE     TAKE these medications   AIMSCO INSULIN SYR ULTRA THIN 31G X 5/16" 0.3 ML Misc Generic drug:  Insulin Syringe-Needle U-100   allopurinol 100 MG tablet Commonly known as:  ZYLOPRIM Take 200 mg by mouth daily.   amLODipine 10 MG tablet Commonly known as:  NORVASC Take 10 mg by mouth every morning.   aspirin EC 81 MG tablet Take 81 mg by mouth daily.   COLCRYS 0.6 MG tablet Generic drug:  colchicine Take 0.6 mg by mouth daily as needed (Gout).   diazepam 5 MG tablet Commonly known as:   VALIUM Take 1 tablet (5 mg total) by mouth every 8 (eight) hours as needed for muscle spasms. What changed:  how much to take  when to take this  reasons to take this   furosemide 20 MG tablet Commonly known as:  LASIX TAKE ONE TABLET BY MOUTH ONCE DAILY   glipiZIDE 5 MG tablet Commonly known as:  GLUCOTROL Take 5 mg by mouth daily before breakfast.   lisinopril 40 MG tablet Commonly known as:  PRINIVIL,ZESTRIL Take 40 mg by mouth every morning.   metFORMIN 1000 MG tablet Commonly known as:  GLUCOPHAGE Take 1,000 mg by mouth 2 (two) times daily with a meal.   metoprolol 100 MG tablet Commonly known as:  LOPRESSOR Take 100 mg by mouth every morning.   omeprazole 20 MG capsule Commonly known as:  PRILOSEC Take 20 mg by mouth daily.   ondansetron 4 MG tablet Commonly known as:  ZOFRAN Take 1 tablet (4 mg total) by mouth every 8 (eight) hours as needed for nausea or vomiting.   ONE TOUCH ULTRA TEST test strip Generic drug:  glucose blood   oxyCODONE-acetaminophen 10-325 MG tablet Commonly known as:  PERCOCET Take 1 tablet by mouth every 4 (four) hours as needed for pain.   simvastatin 20 MG tablet Commonly known as:  ZOCOR Take 20 mg by mouth every evening.   SYSTANE OP Apply 1 drop to eye as needed (Dry eyes).   traZODone 50 MG tablet Commonly known as:  DESYREL Take 50-100 mg by mouth at bedtime. Take 1 to 2 tablets at bedtime to help with sleep       Diagnostic Studies: Dg Lumbar Spine 2-3 Views  Result Date: 09/18/2015 CLINICAL DATA:  75 year old female with a history of postop lumbar surgery. EXAM: LUMBAR SPINE - 2-3 VIEW COMPARISON:  Plain film 09/17/2015, CT 12/14/2012 FINDINGS: Surgical changes of posterior lumbar interbody fusion with bilateral pedicle screw and rod fixation spanning L4-S1. There is unilateral pedicle screw on the right at L5. Interbody spacer in place after discectomy of L4-L5. Alignment relatively maintained, with unchanged trace  anterolisthesis of L4 on L5. Disc space narrowing with vacuum disc phenomenon at L5-S1 is unchanged. No complicating features. No fracture identified. IMPRESSION: Status post posterior lumbar interbody fusion spanning L4-S1 with unilateral right L5 pedicle screw and interbody spacer at L4-L5. No complicating features. Signed, Dulcy Fanny. Earleen Newport, DO [facility] Electronically Signed   By: Corrie Mckusick D.O.   On: 09/18/2015 08:55   Dg Lumbar Spine 2-3 Views  Result Date: 09/17/2015 CLINICAL DATA:  75 year old female undergoing lumbar surgery. Initial encounter. EXAM: DG C-ARM GT 120 MIN; LUMBAR SPINE -  2-3 VIEW FLUOROSCOPY TIME:  Fluoroscopy Time:  3 minutes 25 seconds Radiation Exposure Index (if provided by the fluoroscopic device): Number of Acquired Spot Images: None COMPARISON:  CT Abdomen and Pelvis 12/14/2012 FINDINGS: Normal lumbar segmentation demonstrated on the 2014 comparison. Four intraoperative fluoroscopic images of the lower lumbar spine are provided. These demonstrate sequelae of transpedicular fusion hardware placement bilaterally at L4, on the right at L5, and bilaterally at S1. There is an L4-L5 interbody implant. Grade 1 anterolisthesis Re demonstrated at L4-L5. IMPRESSION: Postoperative changes at L4-L5 and L5-S1. Electronically Signed   By: Genevie Ann M.D.   On: 09/17/2015 13:55   Dg C-arm Gt 120 Min  Result Date: 09/17/2015 CLINICAL DATA:  75 year old female undergoing lumbar surgery. Initial encounter. EXAM: DG C-ARM GT 120 MIN; LUMBAR SPINE - 2-3 VIEW FLUOROSCOPY TIME:  Fluoroscopy Time:  3 minutes 25 seconds Radiation Exposure Index (if provided by the fluoroscopic device): Number of Acquired Spot Images: None COMPARISON:  CT Abdomen and Pelvis 12/14/2012 FINDINGS: Normal lumbar segmentation demonstrated on the 2014 comparison. Four intraoperative fluoroscopic images of the lower lumbar spine are provided. These demonstrate sequelae  of transpedicular fusion hardware placement bilaterally at L4, on the right at L5, and bilaterally at S1. There is an L4-L5 interbody implant. Grade 1 anterolisthesis Re demonstrated at L4-L5. IMPRESSION: Postoperative changes at L4-L5 and L5-S1. Electronically Signed   By: Genevie Ann M.D.   On: 09/17/2015 13:55    Disposition: 01-Home or Self Care Pt will present to clinic in 2 weeks.  SW has discussed with pt family Rehab placement. They are in agreement.   Pt will Dc to a Rehab facility Post op medications provided   Follow-up Information    BROOKS,DAHARI D, MD. Schedule an appointment as soon as possible for a visit in 2 weeks.   Specialty:  Orthopedic Surgery Why:  If symptoms worsen, For suture removal, For wound re-check Contact information: [address] [place]  [id number] [phone]            Signed: Valinda Hoar 09/19/2015, 2:02 PM

## 2015-09-19 NOTE — Clinical Social Work Placement (Signed)
   CLINICAL SOCIAL WORK PLACEMENT  NOTE  Date:  09/19/2015  Patient Details  Name: JAIDON WOOLCOTT MRN: [id number] Date of Birth: 02-12-1940  Clinical Social Work is seeking post-discharge placement for this patient at the [place] level of care (*CSW will initial, date and re-position this form in  chart as items are completed):  Yes   Patient/family provided with [place] Work Department's list of facilities offering this level of care within the geographic area requested by the patient (or if unable, by the patient's family).  Yes   Patient/family informed of their freedom to choose among providers that offer the needed level of care, that participate in Medicare, Medicaid or managed care program needed by the patient, have an available bed and are willing to accept the patient.  Yes   Patient/family informed of [place]'s ownership interest in [facility] and [facility], as well as of the fact that they are under no obligation to receive care at these facilities.  PASRR submitted to EDS on 09/19/15     PASRR number received on 09/19/15     Existing PASRR number confirmed on       FL2 transmitted to all facilities in geographic area requested by pt/family on 09/19/15     FL2 transmitted to all facilities within larger geographic area on       Patient informed that his/her managed care company has contracts with or will negotiate with certain facilities, including the following:            Patient/family informed of bed offers received.  Patient chooses bed at       Physician recommends and patient chooses bed at      Patient to be transferred to   on  .  Patient to be transferred to facility by       Patient family notified on   of transfer.  Name of family member notified:        PHYSICIAN       Additional Comment:    _______________________________________________ Darden Dates, LCSW 09/19/2015, 2:07 PM

## 2015-09-19 NOTE — Clinical Social Work Note (Signed)
Clinical Social Work Assessment  Patient Details  Name: Nancy [name] MRN: [id number] Date of Birth: February 01, 1940  Date of referral:  09/19/15               Reason for consult:  Facility Placement                Permission sought to share information with:  Family Supports Permission granted to share information::  Yes, Verbal Permission Granted  Name::     [name]  Relationship::  Daughter  Contact Information:  [phone]  Housing/Transportation Living arrangements for the past 2 months:  Single Family Home Source of Information:  Adult Children Patient Interpreter Needed:  None Criminal Activity/Legal Involvement Pertinent to Current Situation/Hospitalization:  No - Comment as needed Significant Relationships:  Adult Children, Other Family Members Lives with:  Adult Children Do you feel safe going back to the place where you live?  Yes Need for family participation in patient care:  Yes (Comment)  Care giving concerns:  No care giving concerns identified.   Social Worker assessment / plan:  CSW met with pt and daughter to address consult. Pt was sleeping soundly at time of assessment, however is alert and oriented x4. CSW introduced herself and explained role of social work. CSW also explained role of social work. CSW also explained process of discharging to SNF. Pt's daughter is able to assist pt at discharge however pt would benefit from STR prior to returning home. CSW initiated a SNF search and followed up with bed offers. Pt's daughter chose [facility]. CSW updated facility. PA is aware as pt is potentially ready for discharge today. CSW will continue to follow.   Employment status:  Retired Nurse, adult PT Recommendations:  Home with [place], 24 Hour Supervision (MD is recommending SNF. ) Information / Referral to community resources:  [place]  Patient/Family's Response to care:  Pt's daughter was appreciative of  CSW's support.   Patient/Family's Understanding of and Emotional Response to Diagnosis, Current Treatment, and Prognosis:  Pt's daughter understands that pt would benefit from STR at SNF prior to returning home.   Emotional Assessment Appearance:  Appears stated age Attitude/Demeanor/Rapport:  Other Affect (typically observed):    Orientation:  Oriented to Self, Oriented to Place, Oriented to  Time, Oriented to Situation (Per chart review) Alcohol / Substance use:  Never Used Psych involvement (Current and /or in the community):  No (Comment)  Discharge Needs  Concerns to be addressed:  No discharge needs identified Readmission within the last 30 days:  No Current discharge risk:  None Barriers to Discharge:  Continued Medical Work up   [employer], LCSW 09/19/2015, 2:34 PM

## 2015-09-19 NOTE — Progress Notes (Signed)
Patient is not eating at this time. Pt appears lethargic on assessment. VSS. IVF resumed. Family at bedside and request to have PM meds held. Will continue to monitor.   Ave Filter, RN

## 2015-09-19 NOTE — Discharge Summary (Signed)
Nancy Hoar, PA-C Physician Assistant Certified Signed Progress Notes Date of Service: 09/19/2015 2:02 PM    Expand All Collapse All   [] Hide copied text [] Hover for attribution information Patient ID: Nancy [name], female   DOB: [birth year], 75 y.o.   MRN: [id number] Physician Discharge Summary  Patient ID: Nancy [name] MRN: [id number] DOB/AGE: 10-21-1940 75 y.o.  Admit date: 09/17/2015 Discharge date: 09/20/2015  Admission Diagnoses:  Degenerative Lumbar Spondylolisthesis, LLE pain  Discharge Diagnoses:  Active Problems:   Back pain       Past Medical History:  Diagnosis Date  . Breast cyst    left breast  . Chest pain   . Complication of anesthesia 1986   slow to awaken after cholecystectomy  . Diastolic dysfunction    with mild pulmonary HTN by echo 07/2011  . Gastric ulcer   . GERD (gastroesophageal reflux disease)   . Gout   . Hyperlipidemia   . Hypertension   . Osteoarthritis   . Osteopenia   . Shortness of breath dyspnea    with activity  . Spine deformity    lower spine  . Type II diabetes mellitus (HCC)     Surgeries: Procedure(s): TRANSFORAMINAL LUMBAR INTERBODY FUSION (TLIF) WITH PEDICLE SCREW FIXATION 1 LEVEL , Lumbar 4-5 FUSION POSTERIOR SPINAL MULTILEVEL L4- S1 on 09/17/2015   Consultants (if any):   Discharged Condition: Improved  Hospital Course: Nancy [name] is an 75 y.o. female who was admitted 09/17/2015 with a diagnosis of degenerative Lumbar Spondylisthesis, LLE pain and went to the operating room on 09/17/2015 and underwent the above named procedures. Decreased leg pain. Incisional back pain present. On day 1 post op she was having difficulty getting out of bed and ambulation because of pain.  Post op day 2,  She reports 5/10 pain laying in the bed. Decreased leg pain.  Continued incisional pain.  Pt urinating w/o difficulty.  Pain is limiting the pts ability to participate in PT.  Dr Rolena Infante referred  the pt for a SW consult to consider rehab facility placement.   She was given perioperative antibiotics:            Anti-infectives    Start     Dose/Rate Route Frequency Ordered Stop   09/17/15 2100  ceFAZolin (ANCEF) IVPB 1 g/50 mL premix     1 g 100 mL/hr over 30 Minutes Intravenous Every 8 hours 09/17/15 1603 09/18/15 0455   09/17/15 0800  ceFAZolin (ANCEF) IVPB 2g/100 mL premix     2 g 200 mL/hr over 30 Minutes Intravenous To ShortStay Surgical 09/16/15 1253 09/17/15 1256    .  She was given sequential compression devices, early ambulation, and TED for DVT prophylaxis.  She benefited maximally from the hospital stay and there were no complications.    Recent vital signs:      Vitals:   09/19/15 0046 09/19/15 0902  BP: (!) 157/69 (!) 155/51  Pulse: (!) 106 100  Resp: 18 20  Temp: 100 F (37.8 C) 99.7 F (37.6 C)    Recent laboratory studies:  Recent Labs       Lab Results  Component Value Date   HGB 11.4 (L) 09/10/2015   HGB 12.6 07/09/2011   HGB 12.2 12/19/2008     Recent Labs  Lab Results  Component Value Date   WBC 6.7 09/10/2015   PLT 245 09/10/2015     Recent Labs  No results found for: INR   Recent Labs  Lab Results  Component Value Date   NA 139 09/10/2015   K 3.4 (L) 09/10/2015   CL 107 09/10/2015   CO2 23 09/10/2015   BUN 8 09/10/2015   CREATININE 0.82 09/10/2015   GLUCOSE 174 (H) 09/10/2015      Discharge Medications:     Medication List    STOP taking these medications   gabapentin 300 MG capsule Commonly known as:  NEURONTIN  HYDROcodone-acetaminophen 5-325 MG tablet Commonly known as:  NORCO  meloxicam 7.5 MG tablet Commonly known as:  MOBIC  NONFORMULARY OR COMPOUNDED ITEM  predniSONE 20 MG tablet Commonly known as:  DELTASONE    TAKE these medications   AIMSCO INSULIN SYR ULTRA THIN 31G X 5/16" 0.3 ML Misc Generic drug:  Insulin Syringe-Needle U-100  allopurinol 100 MG  tablet Commonly known as:  ZYLOPRIM Take 200 mg by mouth daily.  amLODipine 10 MG tablet Commonly known as:  NORVASC Take 10 mg by mouth every morning.  aspirin EC 81 MG tablet Take 81 mg by mouth daily.  COLCRYS 0.6 MG tablet Generic drug:  colchicine Take 0.6 mg by mouth daily as needed (Gout).  diazepam 5 MG tablet Commonly known as:  VALIUM Take 1 tablet (5 mg total) by mouth every 8 (eight) hours as needed for muscle spasms. What changed:  how much to take  when to take this  reasons to take this  furosemide 20 MG tablet Commonly known as:  LASIX TAKE ONE TABLET BY MOUTH ONCE DAILY  glipiZIDE 5 MG tablet Commonly known as:  GLUCOTROL Take 5 mg by mouth daily before breakfast.  lisinopril 40 MG tablet Commonly known as:  PRINIVIL,ZESTRIL Take 40 mg by mouth every morning.  metFORMIN 1000 MG tablet Commonly known as:  GLUCOPHAGE Take 1,000 mg by mouth 2 (two) times daily with a meal.  metoprolol 100 MG tablet Commonly known as:  LOPRESSOR Take 100 mg by mouth every morning.  omeprazole 20 MG capsule Commonly known as:  PRILOSEC Take 20 mg by mouth daily.  ondansetron 4 MG tablet Commonly known as:  ZOFRAN Take 1 tablet (4 mg total) by mouth every 8 (eight) hours as needed for nausea or vomiting.  ONE TOUCH ULTRA TEST test strip Generic drug:  glucose blood  oxyCODONE-acetaminophen 10-325 MG tablet Commonly known as:  PERCOCET Take 1 tablet by mouth every 4 (four) hours as needed for pain.  simvastatin 20 MG tablet Commonly known as:  ZOCOR Take 20 mg by mouth every evening.  SYSTANE OP Apply 1 drop to eye as needed (Dry eyes).  traZODone 50 MG tablet Commonly known as:  DESYREL Take 50-100 mg by mouth at bedtime. Take 1 to 2 tablets at bedtime to help with sleep      Diagnostic Studies:  Imaging Results  Dg Lumbar Spine 2-3 Views  Result Date: 09/18/2015 CLINICAL DATA:  75 year old female with a history of postop lumbar surgery. EXAM: LUMBAR SPINE -  2-3 VIEW COMPARISON:  Plain film 09/17/2015, CT 12/14/2012 FINDINGS: Surgical changes of posterior lumbar interbody fusion with bilateral pedicle screw and rod fixation spanning L4-S1. There is unilateral pedicle screw on the right at L5. Interbody spacer in place after discectomy of L4-L5. Alignment relatively maintained, with unchanged trace anterolisthesis of L4 on L5. Disc space narrowing with vacuum disc phenomenon at L5-S1 is unchanged. No complicating features. No fracture identified. IMPRESSION: Status post posterior lumbar interbody fusion spanning L4-S1 with unilateral right L5 pedicle screw and interbody spacer at L4-L5. No complicating  features. Signed, Dulcy Fanny. Earleen Newport, DO [facility] Electronically Signed   By: Corrie Mckusick D.O.   On: 09/18/2015 08:55   Dg Lumbar Spine 2-3 Views  Result Date: 09/17/2015 CLINICAL DATA:  75 year old female undergoing lumbar surgery. Initial encounter. EXAM: DG C-ARM GT 120 MIN; LUMBAR SPINE - 2-3 VIEW FLUOROSCOPY TIME:  Fluoroscopy Time:  3 minutes 25 seconds Radiation Exposure Index (if provided by the fluoroscopic device): Number of Acquired Spot Images: None COMPARISON:  CT Abdomen and Pelvis 12/14/2012 FINDINGS: Normal lumbar segmentation demonstrated on the 2014 comparison. Four intraoperative fluoroscopic images of the lower lumbar spine are provided. These demonstrate sequelae of transpedicular fusion hardware placement bilaterally at L4, on the right at L5, and bilaterally at S1. There is an L4-L5 interbody implant. Grade 1 anterolisthesis Re demonstrated at L4-L5. IMPRESSION: Postoperative changes at L4-L5 and L5-S1. Electronically Signed   By: Genevie Ann M.D.   On: 09/17/2015 13:55   Dg C-arm Gt 120 Min  Result Date: 09/17/2015 CLINICAL DATA:  75 year old female undergoing lumbar surgery. Initial encounter. EXAM: DG C-ARM GT 120 MIN; LUMBAR SPINE - 2-3 VIEW FLUOROSCOPY TIME:  Fluoroscopy Time:  3  minutes 25 seconds Radiation Exposure Index (if provided by the fluoroscopic device): Number of Acquired Spot Images: None COMPARISON:  CT Abdomen and Pelvis 12/14/2012 FINDINGS: Normal lumbar segmentation demonstrated on the 2014 comparison. Four intraoperative fluoroscopic images of the lower lumbar spine are provided. These demonstrate sequelae of transpedicular fusion hardware placement bilaterally at L4, on the right at L5, and bilaterally at S1. There is an L4-L5 interbody implant. Grade 1 anterolisthesis Re demonstrated at L4-L5. IMPRESSION: Postoperative changes at L4-L5 and L5-S1. Electronically Signed   By: Genevie Ann M.D.   On: 09/17/2015 13:55     Disposition: 01-Home or Self Care Pt will present to clinic in 2 weeks.  SW has discussed with pt family Rehab placement. They are in agreement.   Pt will Dc to a Rehab facility Post op medications provided      Follow-up Information    Johany Hansman D, MD. Schedule an appointment as soon as possible for a visit in 2 weeks.   Specialty:  Orthopedic Surgery Why:  If symptoms worsen, For suture removal, For wound re-check Contact information: [address] [place] [address] [id number]            Signed: Valinda [name] 09/19/2015, 2:02 PM      Addendum:  [place] Pugh. Babish   PA-C  09/20/2015, 11:07 AM  Patient alert and orientated x3.  Lethargy improved  Ambulation improving. Mild nausea Plan on d/c to home with HHS and hospital bed Cleared by PT F/U in 7-9 days for re-evaluation Wound healing well

## 2015-09-19 NOTE — Progress Notes (Addendum)
    Subjective: Procedure(s) (LRB): TRANSFORAMINAL LUMBAR INTERBODY FUSION (TLIF) WITH PEDICLE SCREW FIXATION 1 LEVEL , Lumbar 4-5 (N/A) FUSION POSTERIOR SPINAL MULTILEVEL L4- S1 (N/A) 2 Days Post-Op  Patient reports pain as 5 on 0-10 scale.  Reports decreased leg pain reports incisional back pain   Positive void Negative bowel movement Positive flatus Negative chest pain or shortness of breath  Objective: Vital signs in last 24 hours: Temp:  [100 F (37.8 C)-101.1 F (38.4 C)] 100 F (37.8 C) (09/08 0046) Pulse Rate:  [92-106] 106 (09/08 0046) Resp:  [18] 18 (09/08 0046) BP: (156-161)/(58-69) 157/69 (09/08 0046) SpO2:  [95 %-98 %] 98 % (09/08 0046) Weight:  [86.8 kg (191 lb 5.8 oz)] 86.8 kg (191 lb 5.8 oz) (09/07 2218)  Intake/Output from previous day: 09/07 0701 - 09/08 0700 In: 480 [P.O.:480] Out: 200 [Urine:200]  Labs: No results for input(s): WBC, RBC, HCT, PLT in the last 72 hours. No results for input(s): NA, K, CL, CO2, BUN, CREATININE, GLUCOSE, CALCIUM in the last 72 hours. No results for input(s): LABPT, INR in the last 72 hours.  Physical Exam: Neurologically intact ABD soft Intact pulses distally Incision: dressing C/D/I and no drainage Compartment soft  Assessment/Plan: Patient stable  xrays satisfactory Continue mobilization with physical therapy Continue care  Advance diet Up with therapy  Patient transferred for bed safety. Will look into SNF placement Continue mobilization  Dressing change today   Melina Schools, MD [place] [phone]   Also patient seems somewhat lethargic. Vitals are stable but she feels somewhat overmedicated. Will adjust valium and d/c trazadone

## 2015-09-19 NOTE — Progress Notes (Signed)
Physical Therapy Treatment Patient Details Name: Nancy [name] MRN: [id number] DOB: [birth year] Today's Date: 09/19/2015    History of Present Illness Patient is a 75 y/o female with hx of DM, HTN, HLD, diastolic dysfunction present s/p L4-5 TLIF, posterior fusion L5-S1.    PT Comments    Patient limited by pain this session. Pt reported no back pain and pain in L knee. Max encouragement to participate. Daughter present. Continue to progress as tolerated.   Follow Up Recommendations  Home health PT;Supervision for mobility/OOB     Equipment Recommendations  Rolling walker with 5" wheels;3in1 (PT)    Recommendations for Other Services OT consult     Precautions / Restrictions Precautions Precautions: Back;Fall Precaution Comments: reviewed 3/3 precautions; pt would not try to verbalize precautions Required Braces or Orthoses: Spinal Brace Spinal Brace: Lumbar corset;Applied in sitting position Restrictions Weight Bearing Restrictions: No    Mobility  Bed Mobility               General bed mobility comments: OOB in chair upon arrival  Transfers Overall transfer level: Needs assistance Equipment used: Rolling walker (2 wheeled) Transfers: Sit to/from Stand Sit to Stand: Max assist         General transfer comment: X2 from recliner; max encouragement to mobilize; max cues with hand over hand guidance of hands and positioning of feet as pt followed commands very inconsistently and kept grasping onto RW and not letting go; assist to power up into standing; pt quickly returned to sitting with first trial and took one L step forward second trial and then returned to sitting due to c/o L knee pain  Ambulation/Gait                 Stairs            Wheelchair Mobility    Modified Rankin (Stroke Patients Only)       Balance                                    Cognition Arousal/Alertness: Lethargic;Suspect due to  medications Behavior During Therapy: [facility] for tasks assessed/performed Overall Cognitive Status: Impaired/Different from baseline Area of Impairment: Following commands;Memory     Memory: Decreased recall of precautions;Decreased short-term memory Following Commands: Follows one step commands inconsistently;Follows one step commands with increased time            Exercises      General Comments        Pertinent Vitals/Pain Pain Assessment: Faces Faces Pain Scale: Hurts little more Pain Location: L knee Pain Descriptors / Indicators: Sore Pain Intervention(s): Limited activity within patient's tolerance;Monitored during session;Premedicated before session;Repositioned    Home Living                      Prior Function            PT Goals (current goals can now be found in the care plan section) Acute Rehab PT Goals Patient Stated Goal: none stated Progress towards PT goals: Not progressing toward goals - comment    Frequency  Min 5X/week    PT Plan Current plan remains appropriate    Co-evaluation             End of Session Equipment Utilized During Treatment: Gait belt;Back brace Activity Tolerance: Patient limited by pain;Patient limited by lethargy Patient left: in chair;with call bell/phone within  reach;with chair alarm set;with family/visitor present     Time: [id number] PT Time Calculation (min) (ACUTE ONLY): 26 min  Charges:  $Therapeutic Activity: 23-37 mins                    G Codes:      Salina April, PTA Pager: [phone]   09/19/2015, 1:00 PM

## 2015-09-19 NOTE — Progress Notes (Signed)
Pt is more alert at this time. ICS encourage. Oral fluid given. Will continue to monitor.   Ave Filter, RN

## 2015-09-19 NOTE — Progress Notes (Signed)
Occupational Therapy Treatment Patient Details Name: Nancy [name] MRN: [id number] DOB: 1940/08/16 Today's Date: 09/19/2015    History of present illness Patient is a 75 y/o female with hx of DM, HTN, HLD, diastolic dysfunction present s/p L4-5 TLIF, posterior fusion L5-S1.   OT comments  Pt demonstrates pocketing of food from lunch during 3n1 transfer. Pt spitting food into her own hand and daughter attempting to give patient a tissue. Pt requires repetition to all commands and implusive behavior. Pt with poor return demo of sequence to basic adl task. Recommend SNF.    Follow Up Recommendations  SNF;Supervision/Assistance - 24 hour    Equipment Recommendations  3 in 1 bedside comode    Recommendations for Other Services      Precautions / Restrictions Precautions Precautions: Back;Fall Precaution Comments: reviewed 3/3 precautions; pt would not try to verbalize precautions Required Braces or Orthoses: Spinal Brace Spinal Brace: Lumbar corset;Applied in sitting position Restrictions Weight Bearing Restrictions: No       Mobility Bed Mobility Overal bed mobility: Needs Assistance Bed Mobility: Sit to Supine       Sit to supine: Total assist;+2 for physical assistance   General bed mobility comments: total (A) for all aspects  Transfers Overall transfer level: Needs assistance Equipment used: 2 person hand held assist Transfers: Sit to/from Stand;Stand Pivot Transfers Sit to Stand: Max assist;+2 physical assistance;+2 safety/equipment Stand pivot transfers: Max assist;+2 physical assistance;+2 safety/equipment       General transfer comment: pt required (A) in all aspects weight shift and to pivot. pt demonstrates no initiation    Balance Overall balance assessment: Needs assistance Sitting-balance support: Bilateral upper extremity supported;Feet supported Sitting balance-Leahy Scale: Poor                             ADL Overall ADL's : Needs  assistance/impaired       Grooming Details (indicate cue type and reason): pt pulling food out of her mouth from lunch. pt pocketing food and self retrieving it now that she is more aroused.                 Toilet Transfer: +2 for physical assistance;Maximal assistance;Stand-pivot Toilet Transfer Details (indicate cue type and reason): pt requires facilitation of weight shift and hips repositioned. Pt states "wait" and posterior leanign at times Toileting- Clothing Manipulation and Hygiene: Total assistance;+2 for physical assistance         General ADL Comments: pt requesting to void bladder and then when staff arriving less motivated to complete transfer. Daughter present stating "come on mom you dont want to have an accident they are here to help you come on. You got to get up" Pt required rocking momentum and pad at hips total +2 to achieve static standing      Vision                     Perception     Praxis      Cognition   Behavior During Therapy: Flat affect Overall Cognitive Status: Impaired/Different from baseline Area of Impairment: Following commands;Safety/judgement;Awareness;Problem solving     Memory: Decreased recall of precautions;Decreased short-term memory  Following Commands: Follows one step commands inconsistently Safety/Judgement: Decreased awareness of safety;Decreased awareness of deficits Awareness: Intellectual Problem Solving: Slow processing;Decreased initiation;Difficulty sequencing General Comments: Pt required repetition of commands from staff and daughter. Daughter present and coaching patient through transfer and task.  Extremity/Trunk Assessment               Exercises     Shoulder Instructions       General Comments      Pertinent Vitals/ Pain       Pain Assessment: Faces Pain Score: 10-Worst pain ever Faces Pain Scale: Hurts worst Pain Location: no location given only facial grimace Pain Descriptors /  Indicators: Grimacing Pain Intervention(s): Limited activity within patient's tolerance;Repositioned;Monitored during session  Home Living                                          Prior Functioning/Environment              Frequency Min 2X/week     Progress Toward Goals  OT Goals(current goals can now be found in the care plan section)  Progress towards OT goals: Progressing toward goals  Acute Rehab OT Goals Patient Stated Goal: none stated OT Goal Formulation: With patient Time For Goal Achievement: 10/02/15 Potential to Achieve Goals: Good ADL Goals Pt Will Perform Tub/Shower Transfer: Shower transfer;with supervision;ambulating;shower seat;rolling walker Additional ADL Goal #1: Pt will don/doff back brace independently. Additional ADL Goal #2: Pt will demonstrate adherence to 3/3 back precautions with min verbal cues.  Plan Discharge plan remains appropriate    Co-evaluation                 End of Session Equipment Utilized During Treatment: Back brace;Gait belt   Activity Tolerance Patient tolerated treatment well   Patient Left in bed;with call bell/phone within reach;with bed alarm set;with family/visitor present   Nurse Communication Mobility status;Precautions        Time: [id number] OT Time Calculation (min): 17 min  Charges: OT General Charges $OT Visit: 1 Procedure OT Treatments $Self Care/Home Management : 8-22 mins $Therapeutic Activity: 8-22 mins  Parke Poisson B 09/19/2015, 4:17 PM    Jeri Modena   OTR/L Pager: [phone] Office: [phone] .

## 2015-09-19 NOTE — NC FL2 (Signed)
[place] LEVEL OF CARE SCREENING TOOL     IDENTIFICATION  Patient Name: Nancy [name] Birthdate: 24-Jul-1940 Sex: female Admission Date (Current Location): 09/17/2015  [facility] and [place] Number:  Herbalist and Address:  [facility] [place], [place] [address] [place], [place] [address]      Provider Number: [id number]  Attending Physician Name and Address:  Melina Schools, MD  Relative Name and Phone Number:       Current Level of Care: Hospital Recommended Level of Care: [place] Prior Approval Number:    Date Approved/Denied:   PASRR Number: [id number]  Discharge Plan: SNF    Current Diagnoses: Patient Active Problem List   Diagnosis Date Noted  . Back pain 09/17/2015  . Gait instability 02/19/2013  . Pulmonary hyperinflation 01/17/2013  . Diastolic dysfunction   . Essential hypertension, benign 01/03/2013  . Infected sebaceous cyst 12/23/2010    Orientation RESPIRATION BLADDER Height & Weight     Self, Time, Situation, Place  Normal Incontinent Weight: 191 lb 5.8 oz (86.8 kg) Height:  5' 1.5" (156.2 cm)  BEHAVIORAL SYMPTOMS/MOOD NEUROLOGICAL BOWEL NUTRITION STATUS      Continent Diet (Carb Modified, Thin Liquids)  AMBULATORY STATUS COMMUNICATION OF NEEDS Skin   Limited Assist Verbally Normal                       Personal Care Assistance Level of Assistance  Bathing, Feeding, Dressing Bathing Assistance: Limited assistance Feeding assistance: Independent Dressing Assistance: Limited assistance     Functional Limitations Info  Sight, Hearing, Speech Sight Info: Adequate Hearing Info: Adequate Speech Info: Adequate    SPECIAL CARE FACTORS FREQUENCY  PT (By licensed PT), OT (By licensed OT)     PT Frequency: 5 OT Frequency: 5            Contractures Contractures Info: Not present    Additional Factors Info  Code Status, Allergies, Insulin Sliding Scale, Isolation Precautions  Code Status Info: Full Code Allergies Info: Codeine   Insulin Sliding Scale Info: 4x/day Isolation Precautions Info: Contact-MRSA     Current Medications (09/19/2015):  This is the current hospital active medication list Current Facility-Administered Medications  Medication Dose Route Frequency Provider Last Rate Last Dose  . 0.9 %  sodium chloride infusion  250 mL Intravenous Continuous Melina Schools, MD      . allopurinol (ZYLOPRIM) tablet 200 mg  200 mg Oral Daily Melina Schools, MD   200 mg at 09/19/15 0901  . amLODipine (NORVASC) tablet 10 mg  10 mg Oral q morning - 10a Melina Schools, MD   10 mg at 09/19/15 0901  . Chlorhexidine Gluconate Cloth 2 % PADS 6 each  6 each Topical Q0600 Melina Schools, MD   6 each at 09/19/15 0612  . colchicine tablet 0.6 mg  0.6 mg Oral Daily PRN Melina Schools, MD      . diazepam (VALIUM) tablet 5 mg  5 mg Oral Q8H PRN Melina Schools, MD      . furosemide (LASIX) tablet 20 mg  20 mg Oral Daily Melina Schools, MD   20 mg at 09/19/15 0901  . glipiZIDE (GLUCOTROL) tablet 5 mg  5 mg Oral QAC breakfast Melina Schools, MD   5 mg at 09/19/15 [id number]  . insulin aspart (novoLOG) injection 0-15 Units  0-15 Units Subcutaneous TID WC Melina Schools, MD   5 Units at 09/19/15 1200  . insulin aspart (novoLOG) injection 0-5  Units  0-5 Units Subcutaneous QHS Melina Schools, MD      . lactated ringers infusion   Intravenous Continuous Melina Schools, MD      . lisinopril (PRINIVIL,ZESTRIL) tablet 40 mg  40 mg Oral q morning - 10a Melina Schools, MD   40 mg at 09/19/15 0901  . lubiprostone (AMITIZA) capsule 24 mcg  24 mcg Oral BID WC Melina Schools, MD   24 mcg at 09/19/15 0849  . menthol-cetylpyridinium (CEPACOL) lozenge 3 mg  1 lozenge Oral PRN Melina Schools, MD       Or  . phenol (CHLORASEPTIC) mouth spray 1 spray  1 spray Mouth/Throat PRN Melina Schools, MD      . metFORMIN (GLUCOPHAGE) tablet 1,000 mg  1,000 mg Oral BID WC Melina Schools, MD   1,000 mg at 09/19/15 0849  . metoprolol  tartrate (LOPRESSOR) tablet 100 mg  100 mg Oral q morning - 10a Melina Schools, MD   100 mg at 09/19/15 0902  . ondansetron (ZOFRAN) injection 4 mg  4 mg Intravenous Q4H PRN Melina Schools, MD      . oxyCODONE (Oxy IR/ROXICODONE) immediate release tablet 10 mg  10 mg Oral Q4H PRN Melina Schools, MD   10 mg at 09/19/15 0849  . polyvinyl alcohol (LIQUIFILM TEARS) 1.4 % ophthalmic solution 1 drop  1 drop Both Eyes PRN Melina Schools, MD      . simvastatin (ZOCOR) tablet 20 mg  20 mg Oral QPM Melina Schools, MD   20 mg at 09/18/15 1700  . sodium chloride flush (NS) 0.9 % injection 3 mL  3 mL Intravenous Q12H Melina Schools, MD   3 mL at 09/19/15 0902  . sodium chloride flush (NS) 0.9 % injection 3 mL  3 mL Intravenous PRN Melina Schools, MD         Discharge Medications: Please see discharge summary for a list of discharge medications.  Relevant Imaging Results:  Relevant Lab Results:   Additional Information [id number]  Darden Dates, LCSW

## 2015-09-19 NOTE — Progress Notes (Signed)
Occupational Therapy Treatment Patient Details Name: Nancy [name] MRN: [id number] DOB: 18-Sep-1940 Today's Date: 09/19/2015    History of present illness Patient is a 75 y/o female with hx of DM, HTN, HLD, diastolic dysfunction present s/p L4-5 TLIF, posterior fusion L5-S1.   OT comments  Pt with minimal arousal this session compared to previous OT evaluation notes. Pt is not appropriate for LB AE education or brace education at this time. Rn made aware of lethargic. Pt with medications provided at 8 Am per chart. Recommendation changed to SNF LEVEL care. Daughter present and made aware.   Follow Up Recommendations  SNF;Supervision/Assistance - 24 hour    Equipment Recommendations  3 in 1 bedside comode    Recommendations for Other Services      Precautions / Restrictions Precautions Precautions: Back;Fall Precaution Comments: reviewed 3/3 precautions; pt would not try to verbalize precautions Required Braces or Orthoses: Spinal Brace Spinal Brace: Lumbar corset;Applied in sitting position Restrictions Weight Bearing Restrictions: No       Mobility Bed Mobility               General bed mobility comments: in chair but unable to arouse enought to transfer back to bed . Recommend RN staff use lift to get back to bed  Transfers Overall transfer level: Needs assistance Equipment used: Rolling walker (2 wheeled) Transfers: Sit to/from Stand Sit to Stand: Max assist         General transfer comment: X2 from recliner; max encouragement to mobilize; max cues with hand over hand guidance of hands and positioning of feet as pt followed commands very inconsistently and kept grasping onto RW and not letting go; assist to power up into standing; pt quickly returned to sitting with first trial and took one L step forward second trial and then returned to sitting due to c/o L knee pain    Balance                                   ADL Overall ADL's : Needs  assistance/impaired                                       General ADL Comments: Pt in chair on arrival. pt provided lights on, wash cloth to face, sternal rub and covers removed. Changed positions with feet dangling. pt with arousal but not demonstrating focused attention. tp falling back to sleep almost as soon as arousal. RN Daphne made aware of inability to progress with thearapy at this time. Ot will change discharge recommendations      Vision                     Perception     Praxis      Cognition   Behavior During Therapy: Flat affect Overall Cognitive Status: Difficult to assess Area of Impairment: Following commands;Memory     Memory: Decreased recall of precautions;Decreased short-term memory  Following Commands: Follows one step commands inconsistently;Follows one step commands with increased time            Extremity/Trunk Assessment               Exercises     Shoulder Instructions       General Comments      Pertinent Vitals/ Pain  Pain Assessment: Faces Pain Score: 10-Worst pain ever Faces Pain Scale: Hurts little more Pain Location: L knee  Pain Descriptors / Indicators: Sore Pain Intervention(s): Limited activity within patient's tolerance;Monitored during session;Premedicated before session;Repositioned  Home Living                                          Prior Functioning/Environment              Frequency Min 2X/week     Progress Toward Goals  OT Goals(current goals can now be found in the care plan section)  Progress towards OT goals: Not progressing toward goals - comment  Acute Rehab OT Goals Patient Stated Goal: none stated OT Goal Formulation: With patient Time For Goal Achievement: 10/02/15 Potential to Achieve Goals: Good ADL Goals Pt Will Perform Tub/Shower Transfer: Shower transfer;with supervision;ambulating;shower seat;rolling walker Additional ADL Goal #1: Pt  will don/doff back brace independently. Additional ADL Goal #2: Pt will demonstrate adherence to 3/3 back precautions with min verbal cues.  Plan Discharge plan needs to be updated    Co-evaluation                 End of Session Equipment Utilized During Treatment: Back brace   Activity Tolerance Patient limited by fatigue   Patient Left in chair;with chair alarm set;with family/visitor present   Nurse Communication Mobility status;Precautions;Need for lift equipment        Time: [id number] OT Time Calculation (min): 12 min  Charges: OT General Charges $OT Visit: 1 Procedure OT Treatments $Therapeutic Activity: 8-22 mins  Parke Poisson B 09/19/2015, 3:09 PM  Jeri Modena   OTR/L Pager: [phone] Office: [phone] .

## 2015-09-19 NOTE — Progress Notes (Signed)
PT Cancellation Note  Patient Details Name: Nancy [name] MRN: [id number] DOB: [birth year]   Cancelled Treatment:    Reason Eval/Treat Not Completed: Pain limiting ability to participate PT will check on pt later as time allows.    Salina April, PTA Pager: [phone]   09/19/2015, 9:08 AM

## 2015-09-20 LAB — GLUCOSE, CAPILLARY
GLUCOSE-CAPILLARY: 185 mg/dL — AB (ref 65–99)
GLUCOSE-CAPILLARY: 169 mg/dL — AB (ref 65–99)
GLUCOSE-CAPILLARY: 108 mg/dL — AB (ref 65–99)
Glucose-Capillary: 113 mg/dL — ABNORMAL HIGH (ref 65–99)

## 2015-09-20 LAB — URINALYSIS, ROUTINE W REFLEX MICROSCOPIC
Bilirubin Urine: NEGATIVE
Glucose, UA: 100 mg/dL — AB
Hgb urine dipstick: NEGATIVE
KETONES UR: 15 mg/dL — AB
LEUKOCYTES UA: NEGATIVE
NITRITE: NEGATIVE
PROTEIN: NEGATIVE mg/dL
Specific Gravity, Urine: 1.016 (ref 1.005–1.030)
pH: 6 (ref 5.0–8.0)

## 2015-09-20 MED ORDER — HYDROCODONE-ACETAMINOPHEN 5-325 MG PO TABS
1.0000 | ORAL_TABLET | ORAL | Status: DC | PRN
  Administered 2015-09-20 – 2015-09-21 (×2): 1 via ORAL
  Administered 2015-09-21: 2 via ORAL
  Administered 2015-09-21: 1 via ORAL
  Administered 2015-09-21 (×2): 2 via ORAL
  Administered 2015-09-21: 1 via ORAL
  Administered 2015-09-22: 2 via ORAL
  Administered 2015-09-22: 1 via ORAL
  Filled 2015-09-20 (×5): qty 1
  Filled 2015-09-20 (×4): qty 2

## 2015-09-20 MED ORDER — ACETAMINOPHEN 325 MG PO TABS
650.0000 mg | ORAL_TABLET | Freq: Four times a day (QID) | ORAL | Status: DC | PRN
  Administered 2015-09-20 (×2): 650 mg via ORAL
  Filled 2015-09-20 (×2): qty 2

## 2015-09-20 NOTE — Progress Notes (Addendum)
     Subjective: 3 Days Post-Op Procedure(s) (LRB): TRANSFORAMINAL LUMBAR INTERBODY FUSION (TLIF) WITH PEDICLE SCREW FIXATION 1 LEVEL , Lumbar 4-5 (N/A) FUSION POSTERIOR SPINAL MULTILEVEL L4- S1 (N/A)   Patient reports pain as mild, pain controlled.  However, pt continued to get lethargic after oxy.  Pt's family also didn't like the choice of facility and wish to bring the pt home instead of bring her to a skilled nursing facility.  Discussed with the nurse regarding d/c discharge do to lethargy.  We will change her meds to Norco and stop the Oxy.  Plan for d/c home tomorrow if she is doing well.  Objective:   VITALS:   Vitals:   09/20/15 0542 09/20/15 0910  BP: (!) 106/44 130/65  Pulse:  98  Resp:  18  Temp: 99.1 F (37.3 C) 98.6 F (37 C)    Dorsiflexion/Plantar flexion intact Incision: dressing C/D/I No cellulitis present Compartment soft  LABS No results for input(s): HGB, HCT, WBC, PLT in the last 72 hours.  No results for input(s): NA, K, BUN, CREATININE, GLUCOSE in the last 72 hours.   Assessment/Plan: 3 Days Post-Op Procedure(s) (LRB): TRANSFORAMINAL LUMBAR INTERBODY FUSION (TLIF) WITH PEDICLE SCREW FIXATION 1 LEVEL , Lumbar 4-5 (N/A) FUSION POSTERIOR SPINAL MULTILEVEL L4- S1 (N/A)  Up with therapy Discharge home with home health, when ready probably tomorrow Changed analgesic meds from Oxy to Norco  Follow up in 2 weeks at [facility]. Follow up with Dr. Rolena Infante in 2 weeks.  Contact information:  [facility] [address] [place] Nancy [name]   PAC  09/20/2015, 11:05 AM

## 2015-09-20 NOTE — Progress Notes (Signed)
Physical Therapy Treatment Patient Details Name: Nancy [name] MRN: [id number] DOB: April 11, 1940 Today's Date: 09/20/2015    History of Present Illness Patient is a 75 y/o female with hx of DM, HTN, HLD, diastolic dysfunction present s/p L4-5 TLIF, posterior fusion L5-S1.    PT Comments    Pt making slow progress today vs previous session with increased activity and less overall assistance needed with mobility. Pt continues to demo flat affect and decreased initiation with mobility. Acute PT to continue to progress toward goals.    Follow Up Recommendations  Home health PT;Supervision for mobility/OOB     Equipment Recommendations  Rolling walker with 5" wheels;3in1 (PT)    Recommendations for Other Services OT consult     Precautions / Restrictions Precautions Precautions: Back;Fall Precaution Comments: reviewed 3/3 precautions; pt was able to verbalize understanding Required Braces or Orthoses: Spinal Brace Spinal Brace: Lumbar corset;Applied in sitting position (already on pt with her in recliner on arrival) Restrictions Weight Bearing Restrictions: No    Mobility  Bed Mobility               General bed mobility comments: not performed today as pt was in reclincer before and after session  Transfers Overall transfer level: Needs assistance Equipment used: Rolling walker (2 wheeled) Transfers: Sit to/from Stand Sit to Stand: Mod assist;+2 safety/equipment;+2 physical assistance         General transfer comment: multimodal cues/assistance/facilitation needed to scoot to edge of chair, for hand/foot placement and for anterior weight shifting to stand, assist needed to power up. min assist with cues for hand placement needed with sitting back down to recliner.  Ambulation/Gait Ambulation/Gait assistance: Min assist;+2 safety/equipment Ambulation Distance (Feet): 10 Feet Assistive device: Rolling walker (2 wheeled) Gait Pattern/deviations: Step-through  pattern;Decreased stride length;Trunk flexed Gait velocity: decreased Gait velocity interpretation: Below normal speed for age/gender General Gait Details: slow gait pattern with cues/facilitation for posture, walker position and to keep eyes open with gait.       Cognition Arousal/Alertness: Awake/alert Behavior During Therapy: Flat affect Overall Cognitive Status: Impaired/Different from baseline Area of Impairment: Safety/judgement;Problem solving     Memory: Decreased recall of precautions;Decreased short-term memory Following Commands: Follows one step commands consistently;Follows multi-step commands inconsistently Safety/Judgement: Decreased awareness of safety;Decreased awareness of deficits Awareness: Intellectual Problem Solving: Decreased initiation;Difficulty sequencing;Requires verbal cues General Comments: pt able to better follow commands today with less cues and direction needed. Does continue to need increased time for processing.      Pertinent Vitals/Pain Pain Assessment: 0-10 Pain Score: 7  Pain Location: back and buttocks Pain Descriptors / Indicators: Aching;Sore;Grimacing;Guarding Pain Intervention(s): Limited activity within patient's tolerance;Monitored during session;Repositioned;RN gave pain meds during session;Patient requesting pain meds-RN notified     PT Goals (current goals can now be found in the care plan section) Acute Rehab PT Goals Patient Stated Goal: none stated PT Goal Formulation: With patient Time For Goal Achievement: 10/02/15 Potential to Achieve Goals: Fair Progress towards PT goals: Progressing toward goals    Frequency  Min 5X/week    PT Plan Current plan remains appropriate    End of Session Equipment Utilized During Treatment: Gait belt;Back brace Activity Tolerance: Patient tolerated treatment well;Patient limited by pain Patient left: in chair;with call bell/phone within reach;with nursing/sitter in room;with chair  alarm set     Time: [id number] PT Time Calculation (min) (ACUTE ONLY): 18 min  Charges:  $Gait Training: 8-22 mins  Willow Ora 09/20/2015, 1:46 PM   Willow Ora, PTA, CLT Acute Rehab Services [phone] 09/20/15, 1:49 PM

## 2015-09-20 NOTE — Progress Notes (Signed)
Patient's daughter states patient will not be going to [facility], states she is going to take patient home to her house where patient lives. She states she has walker, BSC for patient's use. RN discussed discharge f/u appt with Dr. Rolena Infante (pt's daughter states she will make appt), s/sx infection, educated patient and daughter to use incentive spirometer, move frequently, keep anti embolism stockings on. Per Dr. Rolena Infante, if patient sleepy, only administer 1/2 of one percocet to patient, never administer percocet and valium at same time. Patient's neuro assessment unchanged. Prescriptions and discharge instructions given to patient's mother.

## 2015-09-20 NOTE — Clinical Social Work Placement (Addendum)
   CLINICAL SOCIAL WORK PLACEMENT  NOTE  Date:  09/20/2015  Patient Details  Name: Nancy [name] MRN: [id number] Date of Birth: [birth year]  Clinical Social Work is seeking post-discharge placement for this patient at the [place] level of care (*CSW will initial, date and re-position this form in  chart as items are completed):  Yes   Patient/family provided with [place] Work Department's list of facilities offering this level of care within the geographic area requested by the patient (or if unable, by the patient's family).  Yes   Patient/family informed of their freedom to choose among providers that offer the needed level of care, that participate in Medicare, Medicaid or managed care program needed by the patient, have an available bed and are willing to accept the patient.  Yes   Patient/family informed of [facility]'s ownership interest in [facility] and [facility], as well as of the fact that they are under no obligation to receive care at these facilities.  PASRR submitted to E[facility] on 09/19/15     PASRR number received on 09/19/15     Existing PASRR number confirmed on       FL2 transmitted to all facilities in geographic area requested by pt/family on 09/19/15     FL2 transmitted to all facilities within larger geographic area on       Patient informed that his/her managed care company has contracts with or will negotiate with certain facilities, including the following:        Yes   Patient/family informed of bed offers received.  Patient chooses bed at [facility] recommends and patient chooses bed at      Patient to be transferred to [facility] on 09/20/15.  Patient to be transferred to facility by Ambulance     Patient family notified on 09/20/15 of transfer.  Name of family member notified:  Rip Harbour     PHYSICIAN Please prepare priority discharge summary, including  medications, Please prepare prescriptions, Please sign FL2     Additional Comment:   Per MD patient ready for DC to [facility]. RN, patient, patient's family, and facility notified of DC. RN given number for report. DC packet on chart. Ambulance transport requested for patient for 2:30PM pickup per family's request. CSW signing off.     UPDATE: The patient and family have now decided to DC home. Transport updated to patient's home address. RN and [facility] updated on disposition change. RNCM assisting with [facility] needs. CSW signing off _______________________________________________ Rigoberto Noel, LCSW 09/20/2015, 12:12 PM

## 2015-09-20 NOTE — Progress Notes (Signed)
Patient has become more disoriented, oriented x2, states sentences that do not respond appropriately to questions asked. IV to be restarted, IV fluids will be restarted. PA is aware patient more disoriented. Medications changed, discharge held. PTAR notified.

## 2015-09-20 NOTE — Progress Notes (Signed)
Patient transferred to chair from bedside commode. Patient is a 2 person max assist. Patient currently sitting in chair with chair alarm on, daughter at bedside. Encouraged incentive spirometer.

## 2015-09-20 NOTE — Care Management Note (Signed)
Case Management Note  Patient Details  Name: Nancy [name] MRN: [id number] Date of Birth: 04-Mar-1940  Subjective/Objective:                  1. Degenerative lumbar spondylolisthesis, L4-L5 with neuropathic left     leg pain. 2. L5-S1 degenerative disk disease with left foraminal and lateral     recess stenosis Action/Plan: Discharge planning Expected Discharge Date:  09/20/15               Expected Discharge Plan:  [facility]  In-House Referral:     Discharge planning Services  CM Consult  Post Acute Care Choice:  Home Health Choice offered to:  Patient, Adult Children  DME Arranged:  3-N-1 DME Agency:  [facility] Arranged:  PT, Nurse's Aide [place] Agency:  [facility]  Status of Service:  Completed, signed off  If discussed at [place] of Stay Meetings, dates discussed:    Additional Comments: CM met with pt to offer choice of home health agency.  Pt chooses [facility] to render HHPT/Aide and contact is daughter Buzzy Han [phone].  CM called Adrian Prince, PA and requested orders and face to face.  Referral called to [facility] rep, Jermaine with daughter's contact information and monitoring for F2F.  CM notified [facility] DME rep, Reggie to please deliver the 3n1 to room for daughter, Cinda Quest to take home.  CSW Marshell Levan has already set up PTAR for transport home.  No other CM needs were communicated. Dellie Catholic, RN 09/20/2015, 2:17 PM

## 2015-09-20 NOTE — Progress Notes (Signed)
Patient and family aware of discharge to [facility]. Report given to [facility]. Patient's neuro assessment unchanged, Iv removed. Will continue to monitor.

## 2015-09-21 LAB — GLUCOSE, CAPILLARY
GLUCOSE-CAPILLARY: 98 mg/dL (ref 65–99)
GLUCOSE-CAPILLARY: 173 mg/dL — AB (ref 65–99)
GLUCOSE-CAPILLARY: 149 mg/dL — AB (ref 65–99)
GLUCOSE-CAPILLARY: 129 mg/dL — AB (ref 65–99)

## 2015-09-21 NOTE — Progress Notes (Signed)
   Subjective: 4 Days Post-Op Procedure(s) (LRB): TRANSFORAMINAL LUMBAR INTERBODY FUSION (TLIF) WITH PEDICLE SCREW FIXATION 1 LEVEL , Lumbar 4-5 (N/A) FUSION POSTERIOR SPINAL MULTILEVEL L4- S1 (N/A)  Pt c/o mild pain to lumbar spine today Also c/o some recent incontinence Plan is to go home now instead of SNF according to the daughter Working with trying to get hospital bed at home Otherwise doing fair Patient reports pain as mild.  Objective:   VITALS:   Vitals:   09/21/15 0132 09/21/15 0500  BP: (!) 143/63 128/81  Pulse: 96 84  Resp: 20 18  Temp: 99.7 F (37.6 C) 98.6 F (37 C)    Lumbar incision healing well Mild pain with movement nv intact distally bilateral lower extremities  LABS No results for input(s): HGB, HCT, WBC, PLT in the last 72 hours.  No results for input(s): NA, K, BUN, CREATININE, GLUCOSE in the last 72 hours.   Assessment/Plan: 4 Days Post-Op Procedure(s) (LRB): TRANSFORAMINAL LUMBAR INTERBODY FUSION (TLIF) WITH PEDICLE SCREW FIXATION 1 LEVEL , Lumbar 4-5 (N/A) FUSION POSTERIOR SPINAL MULTILEVEL L4- S1 (N/A) Continue PT/Ot Probable d/c home tomorrow Pain management    Merla Riches, MPAS, PA-C  09/21/2015, 8:41 AM

## 2015-09-22 LAB — GLUCOSE, CAPILLARY
GLUCOSE-CAPILLARY: 199 mg/dL — AB (ref 65–99)
Glucose-Capillary: 212 mg/dL — ABNORMAL HIGH (ref 65–99)
Glucose-Capillary: 96 mg/dL (ref 65–99)

## 2015-09-22 MED ORDER — MAGNESIUM CITRATE PO SOLN
0.5000 | Freq: Once | ORAL | Status: AC
  Administered 2015-09-22: 0.5 via ORAL
  Filled 2015-09-22: qty 296

## 2015-09-22 MED ORDER — FLEET ENEMA 7-19 GM/118ML RE ENEM
1.0000 | ENEMA | Freq: Once | RECTAL | Status: AC
  Administered 2015-09-22: 1 via RECTAL
  Filled 2015-09-22: qty 1

## 2015-09-22 NOTE — Clinical Social Work Note (Signed)
Per weekend report and progression, pt will discharge home. RNCM is following for discharge planning needs. CSW is signing off as no further needs identified.   Darden Dates, MSW, LCSW Clinical Social Worker  [phone]

## 2015-09-22 NOTE — Progress Notes (Signed)
    Subjective: Procedure(s) (LRB): TRANSFORAMINAL LUMBAR INTERBODY FUSION (TLIF) WITH PEDICLE SCREW FIXATION 1 LEVEL , Lumbar 4-5 (N/A) FUSION POSTERIOR SPINAL MULTILEVEL L4- S1 (N/A) 5 Days Post-Op  Patient reports pain as 4 on 0-10 scale.  Reports decreased leg pain reports incisional back pain   Positive void Negative bowel movement Positive flatus Negative chest pain or shortness of breath  Objective: Vital signs in last 24 hours: Temp:  [98.8 F (37.1 C)-99.9 F (37.7 C)] 99.5 F (37.5 C) (09/11 1035) Pulse Rate:  [90-95] 90 (09/11 1035) Resp:  [18-20] 18 (09/11 1035) BP: (120-147)/(54-78) 120/66 (09/11 1035) SpO2:  [96 %-99 %] 96 % (09/11 1035)  Intake/Output from previous day: No intake/output data recorded.  Labs: No results for input(s): WBC, RBC, HCT, PLT in the last 72 hours. No results for input(s): NA, K, CL, CO2, BUN, CREATININE, GLUCOSE, CALCIUM in the last 72 hours. No results for input(s): LABPT, INR in the last 72 hours.  Physical Exam: Neurologically intact ABD soft Dorsiflexion/Plantar flexion intact Compartment soft  Assessment/Plan: Patient stable  Continue mobilization with physical therapy Continue care  Advance diet Up with therapy  Patient doing better Bowel prep for constipation No complaints of incontinence of saddle anesthesia Ambulation improved Plan on d/c later today after fleets/mag citrate HHS and hospital bed arranged  Melina Schools, MD [place] [phone]

## 2015-09-22 NOTE — Progress Notes (Signed)
Physical Therapy Treatment Patient Details Name: Nancy [name] MRN: [id number] DOB: 07/18/1940 Today's Date: 09/22/2015    History of Present Illness Patient is a 75 y/o female with hx of DM, HTN, HLD, diastolic dysfunction present s/p L4-5 TLIF, posterior fusion L5-S1.    PT Comments    Patient continues to require max encouragement to participate especially for ambulation. Pt tolerated ambulating 71ft in room. Daughter present for session. Continue to progress as tolerated with anticipated d/c home with HHPT.   Follow Up Recommendations  Home health PT;Supervision for mobility/OOB     Equipment Recommendations  Rolling walker with 5" wheels;3in1 (PT)    Recommendations for Other Services OT consult     Precautions / Restrictions Precautions Precautions: Back;Fall Precaution Booklet Issued: Yes (comment) Precaution Comments: reviewed precautions with pt and daughter Required Braces or Orthoses: Spinal Brace Spinal Brace: Lumbar corset;Applied in sitting position Restrictions Weight Bearing Restrictions: No    Mobility  Bed Mobility Overal bed mobility: Needs Assistance Bed Mobility: Rolling;Sidelying to Sit Rolling: Min assist Sidelying to sit: Mod assist;HOB elevated       General bed mobility comments: cues for sequencing/technique; assist to flex L LE to roll with tactile cues for hand placement on bed rail and assist to elevate trunk into sitting  Transfers Overall transfer level: Needs assistance Equipment used: Rolling walker (2 wheeled) Transfers: Sit to/from [facility] Sit to Stand: Mod assist;+2 physical assistance Stand pivot transfers: Mod assist;+2 physical assistance       General transfer comment: multimodal cues for hand/foot placement; assist to power up and to manage RW; from EOB and [facility]  Ambulation/Gait Ambulation/Gait assistance: Min assist Ambulation Distance (Feet): 16 Feet Assistive device: Rolling walker (2  wheeled) Gait Pattern/deviations: Step-through pattern;Shuffle;Decreased stride length;Trunk flexed     General Gait Details: slow, shuffling gait at times; cues for posture, safe use of AD, and bilat step length; max encouragement to ambulate with pt repeating "I don't want to!"; cues to keep eyes open; pt has tendency to lean on therapist and take hands off of RW   Stairs            Wheelchair Mobility    Modified Rankin (Stroke Patients Only)       Balance     Sitting balance-Leahy Scale: Fair       Standing balance-Leahy Scale: Fair                      Cognition Arousal/Alertness: Awake/alert Behavior During Therapy: Flat affect Overall Cognitive Status: Impaired/Different from baseline Area of Impairment: Safety/judgement;Problem solving;Following commands       Following Commands: Follows one step commands inconsistently;Follows one step commands with increased time Safety/Judgement: Decreased awareness of safety;Decreased awareness of deficits Awareness: Intellectual Problem Solving: Decreased initiation;Difficulty sequencing;Requires verbal cues;Requires tactile cues;Slow processing      Exercises      General Comments        Pertinent Vitals/Pain Pain Assessment: Faces Faces Pain Scale: Hurts little more Pain Location: back Pain Descriptors / Indicators: Grimacing;Guarding;Moaning;Sore Pain Intervention(s): Limited activity within patient's tolerance;Monitored during session;Premedicated before session;Repositioned    Home Living                      Prior Function            PT Goals (current goals can now be found in the care plan section) Acute Rehab PT Goals Patient Stated Goal: none stated Progress towards PT goals:  Progressing toward goals    Frequency  Min 5X/week    PT Plan Current plan remains appropriate    Co-evaluation             End of Session Equipment Utilized During Treatment: Gait  belt;Back brace Activity Tolerance: Patient limited by pain Patient left: in chair;with call bell/phone within reach;with chair alarm set;with family/visitor present     Time: [id number] PT Time Calculation (min) (ACUTE ONLY): 40 min  Charges:  $Gait Training: 8-22 mins $Therapeutic Activity: 8-22 mins                    G Codes:      Salina April, PTA Pager: [phone]   09/22/2015, 1:03 PM

## 2015-09-22 NOTE — Care Management Note (Signed)
Case Management Note  Patient Details  Name: MARKYLA MECCA MRN: [id number] Date of Birth: 1940-06-05  Subjective/Objective:                    Action/Plan: Patient discharging home today with [facility] services and DME. Pts daughter has already taken 3 in 1 and walker home. Daughter requested bed for home. CM informed Carmen with Dr Rolena Infante and Lenna Sciara with [facility] DME. Bed delivered to her home. Pt needing transportation home. CM spoke with pts daughter and she would like transport arranged for after she returns to hospital for discharge. PTAR set up for 5:30 pm and daughter informed and in agreement. Butch Penny with [facility] informed of discharge also. Bedside RN updated.   Expected Discharge Date:   (Pending)               Expected Discharge Plan:  [place]  In-House Referral:     Discharge planning Services  CM Consult  Post Acute Care Choice:  Home Health Choice offered to:  Patient, Adult Children  DME Arranged:  3-N-1 DME Agency:  [place] Arranged:  PT, Nurse's Aide [place] Agency:  [place]  Status of Service:  Completed, signed off  If discussed at [place] of Stay Meetings, dates discussed:    Additional Comments:  Pollie Friar, RN 09/22/2015, 4:25 PM

## 2015-09-22 NOTE — Progress Notes (Signed)
Patient is discharged from room 5M03 at this time. Alert and in stable condition. IV site d/c'd and instructions read to patient and daughter with understanding verbalized. Left unit via stretcher by PTAR with all belongings at side.

## 2015-10-01 ENCOUNTER — Inpatient Hospital Stay (HOSPITAL_COMMUNITY): Payer: Medicare Other | Admitting: Anesthesiology

## 2015-10-01 ENCOUNTER — Encounter (HOSPITAL_COMMUNITY): Payer: Self-pay | Admitting: Surgery

## 2015-10-01 ENCOUNTER — Encounter (HOSPITAL_COMMUNITY)
Admission: AD | Disposition: A | Discharge status: Skilled Nursing Facility | Payer: Self-pay | Source: Ambulatory Visit | Attending: Orthopedic Surgery

## 2015-10-01 ENCOUNTER — Inpatient Hospital Stay (HOSPITAL_COMMUNITY)
Admission: AD | Admit: 2015-10-01 | Discharge: 2015-10-08 | DRG: 908 | Disposition: A | Discharge status: Skilled Nursing Facility | Payer: Medicare Other | Source: Ambulatory Visit | Attending: Orthopedic Surgery | Admitting: Orthopedic Surgery

## 2015-10-01 DIAGNOSIS — Z6835 Body mass index (BMI) 35.0-35.9, adult: Secondary | ICD-10-CM

## 2015-10-01 DIAGNOSIS — Z1612 Extended spectrum beta lactamase (ESBL) resistance: Secondary | ICD-10-CM | POA: Diagnosis present

## 2015-10-01 DIAGNOSIS — A498 Other bacterial infections of unspecified site: Secondary | ICD-10-CM | POA: Diagnosis not present

## 2015-10-01 DIAGNOSIS — Z9049 Acquired absence of other specified parts of digestive tract: Secondary | ICD-10-CM | POA: Diagnosis not present

## 2015-10-01 DIAGNOSIS — Z981 Arthrodesis status: Secondary | ICD-10-CM | POA: Diagnosis not present

## 2015-10-01 DIAGNOSIS — I110 Hypertensive heart disease with heart failure: Secondary | ICD-10-CM | POA: Diagnosis present

## 2015-10-01 DIAGNOSIS — T849XXA Unspecified complication of internal orthopedic prosthetic device, implant and graft, initial encounter: Secondary | ICD-10-CM

## 2015-10-01 DIAGNOSIS — I5189 Other ill-defined heart diseases: Secondary | ICD-10-CM | POA: Diagnosis present

## 2015-10-01 DIAGNOSIS — E1165 Type 2 diabetes mellitus with hyperglycemia: Secondary | ICD-10-CM | POA: Diagnosis not present

## 2015-10-01 DIAGNOSIS — R410 Disorientation, unspecified: Secondary | ICD-10-CM | POA: Diagnosis not present

## 2015-10-01 DIAGNOSIS — I959 Hypotension, unspecified: Secondary | ICD-10-CM | POA: Diagnosis not present

## 2015-10-01 DIAGNOSIS — S31000A Unspecified open wound of lower back and pelvis without penetration into retroperitoneum, initial encounter: Secondary | ICD-10-CM | POA: Diagnosis not present

## 2015-10-01 DIAGNOSIS — T783XXA Angioneurotic edema, initial encounter: Secondary | ICD-10-CM | POA: Diagnosis not present

## 2015-10-01 DIAGNOSIS — B9689 Other specified bacterial agents as the cause of diseases classified elsewhere: Secondary | ICD-10-CM | POA: Diagnosis not present

## 2015-10-01 DIAGNOSIS — R262 Difficulty in walking, not elsewhere classified: Secondary | ICD-10-CM | POA: Diagnosis not present

## 2015-10-01 DIAGNOSIS — M545 Low back pain: Secondary | ICD-10-CM | POA: Diagnosis not present

## 2015-10-01 DIAGNOSIS — M109 Gout, unspecified: Secondary | ICD-10-CM | POA: Diagnosis present

## 2015-10-01 DIAGNOSIS — B9620 Unspecified Escherichia coli [E. coli] as the cause of diseases classified elsewhere: Secondary | ICD-10-CM | POA: Diagnosis not present

## 2015-10-01 DIAGNOSIS — I519 Heart disease, unspecified: Secondary | ICD-10-CM | POA: Diagnosis not present

## 2015-10-01 DIAGNOSIS — T148 Other injury of unspecified body region: Secondary | ICD-10-CM | POA: Diagnosis not present

## 2015-10-01 DIAGNOSIS — E119 Type 2 diabetes mellitus without complications: Secondary | ICD-10-CM | POA: Diagnosis not present

## 2015-10-01 DIAGNOSIS — K219 Gastro-esophageal reflux disease without esophagitis: Secondary | ICD-10-CM | POA: Diagnosis present

## 2015-10-01 DIAGNOSIS — E118 Type 2 diabetes mellitus with unspecified complications: Secondary | ICD-10-CM | POA: Diagnosis not present

## 2015-10-01 DIAGNOSIS — IMO0001 Reserved for inherently not codable concepts without codable children: Secondary | ICD-10-CM

## 2015-10-01 DIAGNOSIS — T814XXA Infection following a procedure, initial encounter: Secondary | ICD-10-CM | POA: Diagnosis present

## 2015-10-01 DIAGNOSIS — I272 Other secondary pulmonary hypertension: Secondary | ICD-10-CM | POA: Diagnosis present

## 2015-10-01 DIAGNOSIS — Z8249 Family history of ischemic heart disease and other diseases of the circulatory system: Secondary | ICD-10-CM

## 2015-10-01 DIAGNOSIS — T783XXD Angioneurotic edema, subsequent encounter: Secondary | ICD-10-CM | POA: Diagnosis not present

## 2015-10-01 DIAGNOSIS — Z79899 Other long term (current) drug therapy: Secondary | ICD-10-CM

## 2015-10-01 DIAGNOSIS — I10 Essential (primary) hypertension: Secondary | ICD-10-CM | POA: Diagnosis present

## 2015-10-01 DIAGNOSIS — K5900 Constipation, unspecified: Secondary | ICD-10-CM | POA: Diagnosis not present

## 2015-10-01 DIAGNOSIS — Z7982 Long term (current) use of aspirin: Secondary | ICD-10-CM

## 2015-10-01 DIAGNOSIS — T8131XA Disruption of external operation (surgical) wound, not elsewhere classified, initial encounter: Secondary | ICD-10-CM | POA: Diagnosis not present

## 2015-10-01 DIAGNOSIS — L89159 Pressure ulcer of sacral region, unspecified stage: Secondary | ICD-10-CM | POA: Diagnosis present

## 2015-10-01 DIAGNOSIS — Z885 Allergy status to narcotic agent status: Secondary | ICD-10-CM | POA: Diagnosis not present

## 2015-10-01 DIAGNOSIS — E1169 Type 2 diabetes mellitus with other specified complication: Secondary | ICD-10-CM | POA: Diagnosis not present

## 2015-10-01 DIAGNOSIS — E785 Hyperlipidemia, unspecified: Secondary | ICD-10-CM | POA: Diagnosis present

## 2015-10-01 DIAGNOSIS — F05 Delirium due to known physiological condition: Secondary | ICD-10-CM | POA: Diagnosis not present

## 2015-10-01 DIAGNOSIS — E1142 Type 2 diabetes mellitus with diabetic polyneuropathy: Secondary | ICD-10-CM

## 2015-10-01 DIAGNOSIS — I5032 Chronic diastolic (congestive) heart failure: Secondary | ICD-10-CM | POA: Diagnosis present

## 2015-10-01 DIAGNOSIS — M549 Dorsalgia, unspecified: Secondary | ICD-10-CM | POA: Diagnosis present

## 2015-10-01 DIAGNOSIS — L089 Local infection of the skin and subcutaneous tissue, unspecified: Secondary | ICD-10-CM | POA: Diagnosis not present

## 2015-10-01 DIAGNOSIS — Y838 Other surgical procedures as the cause of abnormal reaction of the patient, or of later complication, without mention of misadventure at the time of the procedure: Secondary | ICD-10-CM | POA: Diagnosis present

## 2015-10-01 DIAGNOSIS — Z7984 Long term (current) use of oral hypoglycemic drugs: Secondary | ICD-10-CM

## 2015-10-01 DIAGNOSIS — T148XXA Other injury of unspecified body region, initial encounter: Secondary | ICD-10-CM

## 2015-10-01 DIAGNOSIS — T464X5A Adverse effect of angiotensin-converting-enzyme inhibitors, initial encounter: Secondary | ICD-10-CM | POA: Diagnosis not present

## 2015-10-01 HISTORY — PX: LUMBAR WOUND DEBRIDEMENT: SHX1988

## 2015-10-01 LAB — COMPREHENSIVE METABOLIC PANEL
ALT: 12 U/L — AB (ref 14–54)
ANION GAP: 9 (ref 5–15)
AST: 18 U/L (ref 15–41)
Albumin: 2.8 g/dL — ABNORMAL LOW (ref 3.5–5.0)
Alkaline Phosphatase: 77 U/L (ref 38–126)
BUN: 11 mg/dL (ref 6–20)
CALCIUM: 9 mg/dL (ref 8.9–10.3)
CHLORIDE: 99 mmol/L — AB (ref 101–111)
CO2: 27 mmol/L (ref 22–32)
CREATININE: 1 mg/dL (ref 0.44–1.00)
GFR, EST NON AFRICAN AMERICAN: 55 mL/min — AB (ref >60)
Glucose, Bld: 164 mg/dL — ABNORMAL HIGH (ref 65–99)
Potassium: 3.9 mmol/L (ref 3.5–5.1)
SODIUM: 135 mmol/L (ref 135–145)
Total Bilirubin: 0.5 mg/dL (ref 0.3–1.2)
Total Protein: 7.4 g/dL (ref 6.5–8.1)

## 2015-10-01 LAB — GLUCOSE, CAPILLARY
GLUCOSE-CAPILLARY: 143 mg/dL — AB (ref 65–99)
GLUCOSE-CAPILLARY: 143 mg/dL — AB (ref 65–99)
Glucose-Capillary: 133 mg/dL — ABNORMAL HIGH (ref 65–99)

## 2015-10-01 LAB — CBC WITH DIFFERENTIAL/PLATELET
Basophils Absolute: 0 10*3/uL (ref 0.0–0.1)
Basophils Relative: 0 %
EOS ABS: 0 10*3/uL (ref 0.0–0.7)
EOS PCT: 0 %
HCT: 29.3 % — ABNORMAL LOW (ref 36.0–46.0)
Hemoglobin: 9.6 g/dL — ABNORMAL LOW (ref 12.0–15.0)
LYMPHS ABS: 2.2 10*3/uL (ref 0.7–4.0)
LYMPHS PCT: 17 %
MCH: 26.5 pg (ref 26.0–34.0)
MCHC: 32.8 g/dL (ref 30.0–36.0)
MCV: 80.9 fL (ref 78.0–100.0)
MONOS PCT: 8 %
Monocytes Absolute: 1 10*3/uL (ref 0.1–1.0)
Neutro Abs: 9.6 10*3/uL — ABNORMAL HIGH (ref 1.7–7.7)
Neutrophils Relative %: 75 %
PLATELETS: 534 10*3/uL — AB (ref 150–400)
RBC: 3.62 MIL/uL — AB (ref 3.87–5.11)
RDW: 16.7 % — ABNORMAL HIGH (ref 11.5–15.5)
WBC: 13 10*3/uL — AB (ref 4.0–10.5)

## 2015-10-01 LAB — PHOSPHORUS: PHOSPHORUS: 4.7 mg/dL — AB (ref 2.5–4.6)

## 2015-10-01 LAB — MAGNESIUM: MAGNESIUM: 1.3 mg/dL — AB (ref 1.7–2.4)

## 2015-10-01 LAB — SURGICAL PCR SCREEN
MRSA, PCR: NEGATIVE
STAPHYLOCOCCUS AUREUS: NEGATIVE

## 2015-10-01 LAB — APTT: aPTT: 31 seconds (ref 24–36)

## 2015-10-01 LAB — PROTIME-INR
INR: 1.18
Prothrombin Time: 15.1 seconds (ref 11.4–15.2)

## 2015-10-01 SURGERY — LUMBAR WOUND DEBRIDEMENT
Anesthesia: General

## 2015-10-01 MED ORDER — VANCOMYCIN HCL 10 G IV SOLR
1500.0000 mg | Freq: Once | INTRAVENOUS | Status: AC
  Administered 2015-10-01: 1500 mg via INTRAVENOUS
  Filled 2015-10-01: qty 1500

## 2015-10-01 MED ORDER — FUROSEMIDE 20 MG PO TABS
20.0000 mg | ORAL_TABLET | Freq: Every day | ORAL | Status: DC
  Administered 2015-10-02 – 2015-10-08 (×7): 20 mg via ORAL
  Filled 2015-10-01 (×7): qty 1

## 2015-10-01 MED ORDER — PHENOL 1.4 % MT LIQD: 1.0000 | OROMUCOSAL | Status: DC | PRN

## 2015-10-01 MED ORDER — LUBIPROSTONE 24 MCG PO CAPS
24.0000 ug | ORAL_CAPSULE | Freq: Two times a day (BID) | ORAL | Status: DC
  Administered 2015-10-02 – 2015-10-08 (×12): 24 ug via ORAL
  Filled 2015-10-01 (×14): qty 1

## 2015-10-01 MED ORDER — KETAMINE HCL 10 MG/ML IJ SOLN
INTRAMUSCULAR | Status: DC | PRN
  Administered 2015-10-01: 20 mg via INTRAVENOUS
  Administered 2015-10-01: 10 mg via INTRAVENOUS

## 2015-10-01 MED ORDER — ALLOPURINOL 100 MG PO TABS
200.0000 mg | ORAL_TABLET | Freq: Every day | ORAL | Status: DC
  Administered 2015-10-02 – 2015-10-08 (×7): 200 mg via ORAL
  Filled 2015-10-01 (×7): qty 2

## 2015-10-01 MED ORDER — VANCOMYCIN HCL IN DEXTROSE 750-5 MG/150ML-% IV SOLN
750.0000 mg | Freq: Two times a day (BID) | INTRAVENOUS | Status: DC
  Administered 2015-10-02 – 2015-10-04 (×5): 750 mg via INTRAVENOUS
  Filled 2015-10-01 (×6): qty 150

## 2015-10-01 MED ORDER — SODIUM CHLORIDE 0.9% FLUSH: 3.0000 mL | INTRAVENOUS | Status: DC | PRN

## 2015-10-01 MED ORDER — METHOCARBAMOL 1000 MG/10ML IJ SOLN
500.0000 mg | Freq: Four times a day (QID) | INTRAVENOUS | Status: DC | PRN
  Administered 2015-10-02 – 2015-10-03 (×3): 500 mg via INTRAVENOUS
  Filled 2015-10-01 (×7): qty 5

## 2015-10-01 MED ORDER — HYDROMORPHONE HCL 1 MG/ML IJ SOLN
0.2500 mg | INTRAMUSCULAR | Status: DC | PRN
  Administered 2015-10-01: 0.25 mg via INTRAVENOUS
  Administered 2015-10-01: 0.5 mg via INTRAVENOUS
  Administered 2015-10-01: 0.25 mg via INTRAVENOUS

## 2015-10-01 MED ORDER — MENTHOL 3 MG MT LOZG: 1.0000 | LOZENGE | OROMUCOSAL | Status: DC | PRN

## 2015-10-01 MED ORDER — KETOROLAC TROMETHAMINE 30 MG/ML IJ SOLN
INTRAMUSCULAR | Status: AC
  Filled 2015-10-01: qty 1

## 2015-10-01 MED ORDER — PROPOFOL 10 MG/ML IV BOLUS
INTRAVENOUS | Status: AC
  Filled 2015-10-01: qty 20

## 2015-10-01 MED ORDER — PIPERACILLIN-TAZOBACTAM 3.375 G IVPB
3.3750 g | Freq: Three times a day (TID) | INTRAVENOUS | Status: DC
  Administered 2015-10-01 – 2015-10-02 (×3): 3.375 g via INTRAVENOUS
  Filled 2015-10-01 (×5): qty 50

## 2015-10-01 MED ORDER — FLEET ENEMA 7-19 GM/118ML RE ENEM: 1.0000 | ENEMA | Freq: Every day | RECTAL | Status: DC | PRN

## 2015-10-01 MED ORDER — SODIUM CHLORIDE 0.9 % IV SOLN
250.0000 mL | INTRAVENOUS | Status: DC
Start: 2015-10-01 — End: 2015-10-03

## 2015-10-01 MED ORDER — ONDANSETRON HCL 4 MG/2ML IJ SOLN: 4.0000 mg | INTRAMUSCULAR | Status: DC | PRN

## 2015-10-01 MED ORDER — 0.9 % SODIUM CHLORIDE (POUR BTL) OPTIME
TOPICAL | Status: DC | PRN
Start: 2015-10-01 — End: 2015-10-01
  Administered 2015-10-01 (×4): 1000 mL

## 2015-10-01 MED ORDER — OXYCODONE HCL 5 MG PO TABS
10.0000 mg | ORAL_TABLET | ORAL | Status: DC | PRN
  Administered 2015-10-02 – 2015-10-03 (×4): 10 mg via ORAL
  Filled 2015-10-01 (×4): qty 2

## 2015-10-01 MED ORDER — CEFAZOLIN IN D5W 1 GM/50ML IV SOLN
1.0000 g | Freq: Three times a day (TID) | INTRAVENOUS | Status: AC
  Administered 2015-10-02 (×2): 1 g via INTRAVENOUS
  Filled 2015-10-01 (×2): qty 50

## 2015-10-01 MED ORDER — MIDAZOLAM HCL 2 MG/2ML IJ SOLN
INTRAMUSCULAR | Status: AC
  Filled 2015-10-01: qty 2

## 2015-10-01 MED ORDER — HYDROCODONE-ACETAMINOPHEN 7.5-325 MG PO TABS
1.0000 | ORAL_TABLET | Freq: Once | ORAL | Status: AC | PRN
  Administered 2015-10-01: 1 via ORAL

## 2015-10-01 MED ORDER — THROMBIN 20000 UNITS EX SOLR
CUTANEOUS | Status: AC
  Filled 2015-10-01: qty 20000

## 2015-10-01 MED ORDER — ONDANSETRON HCL 4 MG/2ML IJ SOLN
INTRAMUSCULAR | Status: DC | PRN
  Administered 2015-10-01: 4 mg via INTRAVENOUS

## 2015-10-01 MED ORDER — CEFAZOLIN SODIUM 1 G IJ SOLR
INTRAMUSCULAR | Status: DC | PRN
  Administered 2015-10-01: 2 g via INTRAMUSCULAR

## 2015-10-01 MED ORDER — MAGNESIUM SULFATE 2 GM/50ML IV SOLN
2.0000 g | Freq: Once | INTRAVENOUS | Status: AC
  Administered 2015-10-02: 2 g via INTRAVENOUS
  Filled 2015-10-01: qty 50

## 2015-10-01 MED ORDER — PHENYLEPHRINE HCL 10 MG/ML IJ SOLN
INTRAMUSCULAR | Status: DC | PRN
  Administered 2015-10-01: 80 ug via INTRAVENOUS

## 2015-10-01 MED ORDER — LACTATED RINGERS IV SOLN
INTRAVENOUS | Status: DC
  Administered 2015-10-01: 21:00:00 via INTRAVENOUS

## 2015-10-01 MED ORDER — PROPOFOL 10 MG/ML IV BOLUS
INTRAVENOUS | Status: DC | PRN
  Administered 2015-10-01: 150 mg via INTRAVENOUS

## 2015-10-01 MED ORDER — METFORMIN HCL 500 MG PO TABS: 1000.0000 mg | ORAL_TABLET | Freq: Two times a day (BID) | ORAL | Status: DC

## 2015-10-01 MED ORDER — MIDAZOLAM HCL 5 MG/5ML IJ SOLN
INTRAMUSCULAR | Status: DC | PRN
  Administered 2015-10-01: 1 mg via INTRAVENOUS

## 2015-10-01 MED ORDER — FENTANYL CITRATE (PF) 100 MCG/2ML IJ SOLN
INTRAMUSCULAR | Status: AC
  Filled 2015-10-01: qty 4

## 2015-10-01 MED ORDER — MORPHINE SULFATE (PF) 2 MG/ML IV SOLN
1.0000 mg | INTRAVENOUS | Status: DC | PRN
  Administered 2015-10-01: 2 mg via INTRAVENOUS
  Filled 2015-10-01: qty 1

## 2015-10-01 MED ORDER — KETAMINE HCL-SODIUM CHLORIDE 100-0.9 MG/10ML-% IV SOSY
PREFILLED_SYRINGE | INTRAVENOUS | Status: AC
  Filled 2015-10-01: qty 10

## 2015-10-01 MED ORDER — HYDROMORPHONE HCL 1 MG/ML IJ SOLN
INTRAMUSCULAR | Status: AC
Start: 2015-10-01 — End: 2015-10-02
  Filled 2015-10-01: qty 1

## 2015-10-01 MED ORDER — ONDANSETRON HCL 4 MG/2ML IJ SOLN
INTRAMUSCULAR | Status: AC
  Filled 2015-10-01: qty 2

## 2015-10-01 MED ORDER — FENTANYL CITRATE (PF) 100 MCG/2ML IJ SOLN
INTRAMUSCULAR | Status: DC | PRN
  Administered 2015-10-01: 50 ug via INTRAVENOUS
  Administered 2015-10-01: 100 ug via INTRAVENOUS
  Administered 2015-10-01: 50 ug via INTRAVENOUS

## 2015-10-01 MED ORDER — LIDOCAINE HCL (CARDIAC) 20 MG/ML IV SOLN
INTRAVENOUS | Status: DC | PRN
  Administered 2015-10-01: 60 mg via INTRAVENOUS

## 2015-10-01 MED ORDER — ROCURONIUM BROMIDE 100 MG/10ML IV SOLN
INTRAVENOUS | Status: DC | PRN
  Administered 2015-10-01: 40 mg via INTRAVENOUS

## 2015-10-01 MED ORDER — METOPROLOL TARTRATE 5 MG/5ML IV SOLN
INTRAVENOUS | Status: DC | PRN
  Administered 2015-10-01 (×2): 2 mg via INTRAVENOUS
  Administered 2015-10-01: 1 mg via INTRAVENOUS

## 2015-10-01 MED ORDER — HYDROCODONE-ACETAMINOPHEN 7.5-325 MG PO TABS
ORAL_TABLET | ORAL | Status: AC
  Filled 2015-10-01: qty 1

## 2015-10-01 MED ORDER — METHOCARBAMOL 500 MG PO TABS
500.0000 mg | ORAL_TABLET | Freq: Four times a day (QID) | ORAL | Status: DC | PRN
  Administered 2015-10-01 – 2015-10-08 (×13): 500 mg via ORAL
  Filled 2015-10-01 (×13): qty 1

## 2015-10-01 MED ORDER — DIPHENHYDRAMINE HCL 50 MG/ML IJ SOLN
25.0000 mg | Freq: Once | INTRAMUSCULAR | Status: AC
  Administered 2015-10-02: 25 mg via INTRAVENOUS
  Filled 2015-10-01: qty 1

## 2015-10-01 MED ORDER — LACTATED RINGERS IV SOLN
INTRAVENOUS | Status: DC
  Administered 2015-10-01: 17:00:00 via INTRAVENOUS

## 2015-10-01 MED ORDER — METOPROLOL TARTRATE 100 MG PO TABS
100.0000 mg | ORAL_TABLET | Freq: Every morning | ORAL | Status: DC
  Administered 2015-10-02 – 2015-10-08 (×7): 100 mg via ORAL
  Filled 2015-10-01 (×7): qty 1

## 2015-10-01 MED ORDER — COLCHICINE 0.6 MG PO TABS
0.6000 mg | ORAL_TABLET | Freq: Every day | ORAL | Status: DC | PRN
  Administered 2015-10-07: 0.6 mg via ORAL
  Filled 2015-10-01: qty 1

## 2015-10-01 MED ORDER — SODIUM CHLORIDE 0.9% FLUSH
3.0000 mL | Freq: Two times a day (BID) | INTRAVENOUS | Status: DC
  Administered 2015-10-03: 3 mL via INTRAVENOUS

## 2015-10-01 MED ORDER — PANTOPRAZOLE SODIUM 40 MG PO TBEC
40.0000 mg | DELAYED_RELEASE_TABLET | Freq: Every day | ORAL | Status: DC
  Administered 2015-10-02 – 2015-10-08 (×7): 40 mg via ORAL
  Filled 2015-10-01 (×7): qty 1

## 2015-10-01 MED ORDER — BUPIVACAINE-EPINEPHRINE (PF) 0.25% -1:200000 IJ SOLN
INTRAMUSCULAR | Status: AC
  Filled 2015-10-01: qty 30

## 2015-10-01 MED ORDER — AMLODIPINE BESYLATE 10 MG PO TABS
10.0000 mg | ORAL_TABLET | Freq: Every morning | ORAL | Status: DC
  Administered 2015-10-02 – 2015-10-08 (×7): 10 mg via ORAL
  Filled 2015-10-01 (×7): qty 1

## 2015-10-01 MED ORDER — KETOROLAC TROMETHAMINE 15 MG/ML IJ SOLN
15.0000 mg | Freq: Once | INTRAMUSCULAR | Status: AC
  Administered 2015-10-01: 15 mg via INTRAVENOUS
  Filled 2015-10-01: qty 1

## 2015-10-01 MED ORDER — SUGAMMADEX SODIUM 200 MG/2ML IV SOLN
INTRAVENOUS | Status: DC | PRN
  Administered 2015-10-01: 160 mg via INTRAVENOUS

## 2015-10-01 MED ORDER — GLIPIZIDE 5 MG PO TABS
5.0000 mg | ORAL_TABLET | Freq: Every day | ORAL | Status: DC
  Administered 2015-10-03 – 2015-10-08 (×6): 5 mg via ORAL
  Filled 2015-10-01 (×7): qty 1

## 2015-10-01 MED ORDER — PROMETHAZINE HCL 25 MG/ML IJ SOLN: 6.2500 mg | INTRAMUSCULAR | Status: DC | PRN

## 2015-10-01 MED ORDER — LISINOPRIL 40 MG PO TABS: 40.0000 mg | ORAL_TABLET | Freq: Every morning | ORAL | Status: DC

## 2015-10-01 MED ORDER — LIDOCAINE 2% (20 MG/ML) 5 ML SYRINGE
INTRAMUSCULAR | Status: AC
  Filled 2015-10-01: qty 5

## 2015-10-01 SURGICAL SUPPLY — 61 items
BUR EGG ELITE 4.0 (BURR) IMPLANT
BUR EGG ELITE 4.0MM (BURR)
CANISTER SUCTION 2500CC (MISCELLANEOUS) ×3 IMPLANT
CLOSURE WOUND 1/2 X4 (GAUZE/BANDAGES/DRESSINGS)
CORDS BIPOLAR (ELECTRODE) ×1 IMPLANT
COVER SURGICAL LIGHT HANDLE (MISCELLANEOUS) ×3 IMPLANT
DRAPE POUCH INSTRU U-SHP 10X18 (DRAPES) ×1 IMPLANT
DRAPE PROXIMA HALF (DRAPES) ×1 IMPLANT
DRAPE SURG 17X23 STRL (DRAPES) ×3 IMPLANT
DRAPE U-SHAPE 47X51 STRL (DRAPES) ×1 IMPLANT
DRESSING ALLEVYN LIFE SACRUM (GAUZE/BANDAGES/DRESSINGS) ×2 IMPLANT
DRSG AQUACEL AG ADV 3.5X10 (GAUZE/BANDAGES/DRESSINGS) ×1 IMPLANT
DRSG VAC ATS SM SENSATRAC (GAUZE/BANDAGES/DRESSINGS) ×4 IMPLANT
DURAPREP 26ML APPLICATOR (WOUND CARE) ×1 IMPLANT
ELECT BLADE 4.0 EZ CLEAN MEGAD (MISCELLANEOUS)
ELECT CAUTERY BLADE 6.4 (BLADE) ×3 IMPLANT
ELECT PENCIL ROCKER SW 15FT (MISCELLANEOUS) ×3 IMPLANT
ELECT REM PT RETURN 9FT ADLT (ELECTROSURGICAL) ×3
ELECTRODE BLDE 4.0 EZ CLN MEGD (MISCELLANEOUS) IMPLANT
ELECTRODE REM PT RTRN 9FT ADLT (ELECTROSURGICAL) ×1 IMPLANT
EVACUATOR 1/8 PVC DRAIN (DRAIN) IMPLANT
GLOVE BIO SURGEON STRL SZ 6.5 (GLOVE) ×2 IMPLANT
GLOVE BIO SURGEONS STRL SZ 6.5 (GLOVE) ×1
GLOVE BIOGEL PI IND STRL 6.5 (GLOVE) ×1 IMPLANT
GLOVE BIOGEL PI IND STRL 8.5 (GLOVE) ×1 IMPLANT
GLOVE BIOGEL PI INDICATOR 6.5 (GLOVE) ×2
GLOVE BIOGEL PI INDICATOR 8.5 (GLOVE) ×2
GLOVE SS BIOGEL STRL SZ 8.5 (GLOVE) ×1 IMPLANT
GLOVE SUPERSENSE BIOGEL SZ 8.5 (GLOVE) ×2
GOWN STRL REUS W/ TWL LRG LVL3 (GOWN DISPOSABLE) ×1 IMPLANT
GOWN STRL REUS W/TWL 2XL LVL3 (GOWN DISPOSABLE) ×6 IMPLANT
GOWN STRL REUS W/TWL LRG LVL3 (GOWN DISPOSABLE) ×3
KIT BASIN OR (CUSTOM PROCEDURE TRAY) ×3 IMPLANT
KIT ROOM TURNOVER OR (KITS) ×3 IMPLANT
NDL SPNL 18GX3.5 QUINCKE PK (NEEDLE) ×2 IMPLANT
NEEDLE 22X1 1/2 (OR ONLY) (NEEDLE) ×1 IMPLANT
NEEDLE SPNL 18GX3.5 QUINCKE PK (NEEDLE) IMPLANT
NS IRRIG 1000ML POUR BTL (IV SOLUTION) ×3 IMPLANT
PACK LAMINECTOMY ORTHO (CUSTOM PROCEDURE TRAY) ×3 IMPLANT
PACK UNIVERSAL I (CUSTOM PROCEDURE TRAY) ×1 IMPLANT
PAD ARMBOARD 7.5X6 YLW CONV (MISCELLANEOUS) ×2 IMPLANT
PAD NEG PRESSURE SENSATRAC (MISCELLANEOUS) ×2 IMPLANT
PATTIES SURGICAL .5 X.5 (GAUZE/BANDAGES/DRESSINGS) IMPLANT
PATTIES SURGICAL .5 X1 (DISPOSABLE) IMPLANT
SPONGE SURGIFOAM ABS GEL 100 (HEMOSTASIS) IMPLANT
STRIP CLOSURE SKIN 1/2X4 (GAUZE/BANDAGES/DRESSINGS) ×1 IMPLANT
SURGIFLO W/THROMBIN 8M KIT (HEMOSTASIS) IMPLANT
SUT BONE WAX W31G (SUTURE) ×1 IMPLANT
SUT MON AB 3-0 SH 27 (SUTURE)
SUT MON AB 3-0 SH27 (SUTURE) ×1 IMPLANT
SUT VIC AB 0 CT1 27 (SUTURE)
SUT VIC AB 0 CT1 27XBRD ANBCTR (SUTURE) ×1 IMPLANT
SUT VIC AB 1 CTX 36 (SUTURE)
SUT VIC AB 1 CTX36XBRD ANBCTR (SUTURE) ×2 IMPLANT
SUT VIC AB 2-0 CT1 18 (SUTURE) ×1 IMPLANT
SYR BULB IRRIGATION 50ML (SYRINGE) ×3 IMPLANT
SYR CONTROL 10ML LL (SYRINGE) ×1 IMPLANT
TOWEL OR 17X24 6PK STRL BLUE (TOWEL DISPOSABLE) ×3 IMPLANT
TOWEL OR 17X26 10 PK STRL BLUE (TOWEL DISPOSABLE) ×3 IMPLANT
WATER STERILE IRR 1000ML POUR (IV SOLUTION) ×1 IMPLANT
YANKAUER SUCT BULB TIP NO VENT (SUCTIONS) ×3 IMPLANT

## 2015-10-01 NOTE — Anesthesia Preprocedure Evaluation (Signed)
Anesthesia Evaluation  Patient identified by MRN, date of birth, ID band Patient awake    Reviewed: Allergy & Precautions, NPO status , Patient's Chart, lab work & pertinent test results, reviewed documented beta blocker date and time   History of Anesthesia Complications (+) PROLONGED EMERGENCE and history of anesthetic complications  Airway Mallampati: II  TM Distance: >3 FB Neck ROM: Full    Dental  (+) Edentulous Upper, Dental Advisory Given   Pulmonary    Pulmonary exam normal        Cardiovascular hypertension, Pt. on medications and Pt. on home beta blockers  Rhythm:Regular Rate:Normal     Neuro/Psych    GI/Hepatic Neg liver ROS, PUD, GERD  Medicated and Controlled,  Endo/Other  diabetes, Type 2  Renal/GU negative Renal ROS  negative genitourinary   Musculoskeletal  (+) Arthritis ,   Abdominal   Peds negative pediatric ROS (+)  Hematology   Anesthesia Other Findings   Reproductive/Obstetrics negative OB ROS                             Lab Results  Component Value Date   WBC 6.7 09/10/2015   HGB 11.4 (L) 09/10/2015   HCT 35.4 (L) 09/10/2015   MCV 80.5 09/10/2015   PLT 245 09/10/2015   Lab Results  Component Value Date   CREATININE 0.82 09/10/2015   BUN 8 09/10/2015   NA 139 09/10/2015   K 3.4 (L) 09/10/2015   CL 107 09/10/2015   CO2 23 09/10/2015    Anesthesia Physical  Anesthesia Plan  ASA: III  Anesthesia Plan: General   Post-op Pain Management:    Induction: Intravenous  Airway Management Planned: Oral ETT  Additional Equipment:   Intra-op Plan:   Post-operative Plan: Extubation in OR  Informed Consent: I have reviewed the patients History and Physical, chart, labs and discussed the procedure including the risks, benefits and alternatives for the proposed anesthesia with the patient or authorized representative who has indicated his/her understanding  and acceptance.   Dental advisory given  Plan Discussed with:   Anesthesia Plan Comments:         Anesthesia Quick Evaluation

## 2015-10-01 NOTE — Brief Op Note (Signed)
10/01/2015  6:16 PM  PATIENT:  Nancy [name]  75 y.o. female  PRE-OPERATIVE DIAGNOSIS:  infected lumbar wound  POST-OPERATIVE DIAGNOSIS:  infected lumbar wound  PROCEDURE:  Procedure(s): LUMBAR WOUND DEBRIDEMENT (N/A)  SURGEON:  Surgeon(s) and Role:    * Melina Schools, MD - Primary  PHYSICIAN ASSISTANT:   ASSISTANTS: none   ANESTHESIA:   general  EBL:  Total I/O In: 700 [I.V.:700] Out: 50 [Blood:50]  BLOOD ADMINISTERED:none  DRAINS: none   LOCAL MEDICATIONS USED:  MARCAINE     SPECIMEN:  Aspirate and Mastectomy with ALND  DISPOSITION OF SPECIMEN:  micro  COUNTS:  YES  TOURNIQUET:  * No tourniquets in log *  DICTATION: .Other Dictation: Dictation Number [id number]  PLAN OF CARE: Admit to inpatient   PATIENT DISPOSITION:  PACU - hemodynamically stable.

## 2015-10-01 NOTE — Consult Note (Signed)
[facility] Medical Consultation  Nancy [name] [id number] DOB: 1940/05/02 DOA: 10/01/2015 PCP: Antony Blackbird, MD   Requesting physician: Melina Schools, MD Date of consultation: 10/01/2015. Reason for consultation: Medical management.  Impression/Recommendations Principal Problem:   Wound infection ([place]) Continue treatment and postop care per Dr. Rolena Infante. Vancomycin and Zosyn per pharmacy until seen by ID. Follow-up wound culture and sensitivity ID will be consulting in a.m.  Active Problems:   Essential hypertension, benign Lisinopril was held due to angioedema. Please see for coverage note. Continue amlodipine 10 mg by mouth daily. Continue metoprolol 100 mg by mouth daily. Continue furosemide 20 mg by mouth daily. Monitor blood pressure periodically.    Diastolic dysfunction Continue furosemide and beta blocker. Lisinopril held due to angioedema.    Back pain Continue Norco, oxycodone and hydromorphone as needed. Continue Robaxin as needed.    Gout Continue colchicine 0.6 milligram by mouth daily. Continue allopurinol 200 mg by mouth daily.    Hyperlipidemia Continue last done modifications. Follow-up with PCP.    Type 2 diabetes mellitus (HCC) Carbohydrate modified diet. Continue glipizide 5 mg by mouth daily before breakfast.   We will follow up again tomorrow. Please contact me if I can be of assistance in the meanwhile. Thank you for this consultation.  Chief Complaint: Wound infection.  HPI:  75 year old female with a past medical history of GERD, PUD, diastolic CHF, pulmonary hypertension, essential hypertension, gout, hyperlipidemia, type 2 diabetes, osteoarthritis, osteopenia who is 2 weeks s/p TLIF. She was following earlier today with Dr. Rolena Infante at the office and had complaints of  pain, healing complications with drainage and dehiscence of wound. She just underwent a formal I&D and application of wound VAC by Dr. Rolena Infante and we are being  consulted for medical management.  She denies fever, but complains of chills, fatigue, states that she has been mostly bedbound after she was discharged from the hospital. She denies chest pain, dyspnea, palpitations, pitting edema lower extremities, abdominal pain, nausea, emesis, diarrhea or constipation. She denies dysuria, hematuria or urgency.  Review of Systems:  As mentioned in history of present illness.  Past Medical History:  Diagnosis Date  . Breast cyst    left breast  . Chest pain   . Complication of anesthesia 1986   slow to awaken after cholecystectomy  . Diastolic dysfunction    with mild pulmonary HTN by echo 07/2011  . Gastric ulcer   . GERD (gastroesophageal reflux disease)   . Gout   . Hyperlipidemia   . Hypertension   . Osteoarthritis   . Osteopenia   . Shortness of breath dyspnea    with activity  . Spine deformity    lower spine  . Type II diabetes mellitus ([place])    Past Surgical History:  Procedure Laterality Date  . ABDOMINAL ADHESION SURGERY    . ABDOMINAL HYSTERECTOMY    . ANUS SURGERY     for torn tissue  . APPENDECTOMY    . BREAST BIOPSY    . BREAST LUMPECTOMY     left  . CHOLECYSTECTOMY  1986  . COLONOSCOPY    . ESOPHAGOGASTRODUODENOSCOPY (EGD) WITH PROPOFOL N/A 07/18/2013   Procedure: ESOPHAGOGASTRODUODENOSCOPY (EGD) WITH PROPOFOL;  Surgeon: Arta Silence, MD;  Location: [facility];  Service: Endoscopy;  Laterality: N/A;  . EUS N/A 07/18/2013   Procedure: ESOPHAGEAL ENDOSCOPIC ULTRASOUND (EUS) RADIAL;  Surgeon: Arta Silence, MD;  Location: [facility];  Service: Endoscopy;  Laterality: N/A;  . MASS EXCISION Left 07/24/2015   Procedure:  EXCISION MASS LEFT LONG FINGER;  Surgeon: Leanora Cover, MD;  Location: [place];  Service: Orthopedics;  Laterality: Left;  . SPINAL FUSION N/A 09/17/2015   Procedure: FUSION POSTERIOR SPINAL MULTILEVEL L4- S1;  Surgeon: Melina Schools, MD;  Location: [place];  Service: Orthopedics;  Laterality:  N/A;  . TRANSFORAMINAL LUMBAR INTERBODY FUSION (TLIF) WITH PEDICLE SCREW FIXATION 1 LEVEL N/A 09/17/2015   Procedure: TRANSFORAMINAL LUMBAR INTERBODY FUSION (TLIF) WITH PEDICLE SCREW FIXATION 1 LEVEL , Lumbar 4-5;  Surgeon: Melina Schools, MD;  Location: [place];  Service: Orthopedics;  Laterality: N/A;   Social History:  reports that she has never smoked. She has never used smokeless tobacco. She reports that she drinks alcohol. She reports that she does not use drugs.  Allergies  Allergen Reactions  . Codeine Other (See Comments)    headache   Family History  Problem Relation Age of Onset  . Heart disease Mother   . Cancer Sister     ovarian    Prior to Admission medications   Medication Sig Start Date End Date Taking? Authorizing Provider  allopurinol (ZYLOPRIM) 100 MG tablet Take 200 mg by mouth daily.    Yes Historical Provider, MD  amLODipine (NORVASC) 10 MG tablet Take 10 mg by mouth every morning.    Yes Historical Provider, MD  aspirin EC 81 MG tablet Take 81 mg by mouth daily.   Yes Historical Provider, MD  colchicine (COLCRYS) 0.6 MG tablet Take 0.6 mg by mouth daily as needed (Gout).    Yes Historical Provider, MD  furosemide (LASIX) 20 MG tablet TAKE ONE TABLET BY MOUTH ONCE DAILY Patient taking differently: Take 20 mg by mouth once daily 06/04/14  Yes Traci R Turner, MD  glipiZIDE (GLUCOTROL) 5 MG tablet Take 5 mg by mouth daily before breakfast.   Yes Historical Provider, MD  lisinopril (PRINIVIL,ZESTRIL) 40 MG tablet Take 40 mg by mouth every morning.    Yes Historical Provider, MD  metFORMIN (GLUCOPHAGE) 1000 MG tablet Take 1,000 mg by mouth 2 (two) times daily with a meal.     Yes Historical Provider, MD  metoprolol (LOPRESSOR) 100 MG tablet Take 100 mg by mouth every morning.    Yes Historical Provider, MD  omeprazole (PRILOSEC) 20 MG capsule Take 20 mg by mouth daily.   Yes Historical Provider, MD  simvastatin (ZOCOR) 20 MG tablet Take 20 mg by mouth every evening.    Yes  Historical Provider, MD  traZODone (DESYREL) 50 MG tablet Take 50-100 mg by mouth at bedtime as needed for sleep.    Yes Historical Provider, MD  Polyethyl Glycol-Propyl Glycol (SYSTANE OP) Apply 1 drop to eye as needed (Dry eyes).    Historical Provider, MD   Physical Exam: Blood pressure (!) 143/53, pulse 99, temperature 97.5 F (36.4 C), resp. rate 20, height 5' 1.5" (1.562 m), weight 86.8 kg (191 lb 5.8 oz), SpO2 100 %. Vitals:   10/01/15 1629 10/01/15 1822  BP: (!) 143/53   Pulse: 99   Resp: 20   Temp: 99.7 F (37.6 C) 97.5 F (36.4 C)     General:  Afebrile, NAD.   Eyes: PERRL, anicteric.   ENT: Oral mucosa was moist and dentition was accident.   Neck: Supple, no JVD.   Cardiovascular: S1-S2 RRR.   Respiratory: CTA bilaterally.   Abdomen: BS positive, soft, nontender.   Skin: Dressing and wound VAC in place.   Musculoskeletal: No clubbing or cyanosis.   Psychiatric: Depressed mood.  Neurologic: Awake, alert, oriented 3, moves all extremities, but unable to fully evaluate due to back pain.  Labs on Admission:  Basic Metabolic Panel:  Recent Labs Lab 10/01/15 1711  NA 133*  K 6.3*  CL 97*  CO2 25  GLUCOSE 142*  BUN 11  CREATININE 1.07*  CALCIUM 9.4   Liver Function Tests: No results for input(s): AST, ALT, ALKPHOS, BILITOT, PROT, ALBUMIN in the last 168 hours. No results for input(s): LIPASE, AMYLASE in the last 168 hours. No results for input(s): AMMONIA in the last 168 hours. CBC:  Recent Labs Lab 10/01/15 1711  WBC 13.0*  NEUTROABS 9.6*  HGB 9.6*  HCT 29.3*  MCV 80.9  PLT 534*   Cardiac Enzymes: No results for input(s): CKTOTAL, CKMB, CKMBINDEX, TROPONINI in the last 168 hours. BNP: Invalid input(s): POCBNP CBG:  Recent Labs Lab 10/01/15 1638 10/01/15 1831  GLUCAP 143* 133*    Radiological Exams on Admission: No results found.   Time spent: 60 minutes.  Reubin Milan [facility] Pager  [phone].  If 7PM-7AM, please contact night-coverage [email or web address] Password [facility] 10/01/2015, 7:39 PM

## 2015-10-01 NOTE — Transfer of Care (Signed)
Immediate Anesthesia Transfer of Care Note  Patient: Nancy [name]  Procedure(s) Performed: Procedure(s): LUMBAR WOUND DEBRIDEMENT (N/A)  Patient Location: PACU  Anesthesia Type:General  Level of Consciousness: awake, alert  and patient cooperative  Airway & Oxygen Therapy: Patient Spontanous Breathing and Patient connected to nasal cannula oxygen  Post-op Assessment: Report given to RN and Post -op Vital signs reviewed and stable  Post vital signs: Reviewed and stable  Last Vitals:  Vitals:   10/01/15 1629  BP: (!) 143/53  Pulse: 99  Resp: 20  Temp: 37.6 C    Last Pain:  Vitals:   10/01/15 1701  TempSrc:   PainSc: 9       Patients Stated Pain Goal: 2 ([id number])  Complications: No apparent anesthesia complications

## 2015-10-01 NOTE — H&P (Signed)
FULP, CAMMIE, MD Chief Complaint: Wound break down History: Patient is 2 weeks s/p TLIF with pain and wound healing complications.  Presents to office and noted to have pain and wound breakdown.  Patient has been sedentary remaining mostly in bed.  As a result of the dehiscence and underlying DM I elected to take her to the OR for formal debridement and wound vac application.   Past Medical History:  Diagnosis Date  . Breast cyst    left breast  . Chest pain   . Complication of anesthesia 1986   slow to awaken after cholecystectomy  . Diastolic dysfunction    with mild pulmonary HTN by echo 07/2011  . Gastric ulcer   . GERD (gastroesophageal reflux disease)   . Gout   . Hyperlipidemia   . Hypertension   . Osteoarthritis   . Osteopenia   . Shortness of breath dyspnea    with activity  . Spine deformity    lower spine  . Type II diabetes mellitus (HCC)     Allergies  Allergen Reactions  . Codeine Other (See Comments)    headache    No current facility-administered medications on file prior to encounter.    Current Outpatient Prescriptions on File Prior to Encounter  Medication Sig Dispense Refill  . allopurinol (ZYLOPRIM) 100 MG tablet Take 200 mg by mouth daily.     [name] amLODipine (NORVASC) 10 MG tablet Take 10 mg by mouth every morning.     [name] aspirin EC 81 MG tablet Take 81 mg by mouth daily.    . colchicine (COLCRYS) 0.6 MG tablet Take 0.6 mg by mouth daily as needed (Gout).     . diazepam (VALIUM) 5 MG tablet Take 1 tablet (5 mg total) by mouth every 8 (eight) hours as needed for muscle spasms. 60 tablet 0  . furosemide (LASIX) 20 MG tablet TAKE ONE TABLET BY MOUTH ONCE DAILY 30 tablet 10  . glipiZIDE (GLUCOTROL) 5 MG tablet Take 5 mg by mouth daily before breakfast.    . lisinopril (PRINIVIL,ZESTRIL) 40 MG tablet Take 40 mg by mouth every morning.     . metFORMIN (GLUCOPHAGE) 1000 MG tablet Take 1,000 mg by mouth 2 (two) times daily with a meal.      . metoprolol  (LOPRESSOR) 100 MG tablet Take 100 mg by mouth every morning.     [name] omeprazole (PRILOSEC) 20 MG capsule Take 20 mg by mouth daily.    . ondansetron (ZOFRAN) 4 MG tablet Take 1 tablet (4 mg total) by mouth every 8 (eight) hours as needed for nausea or vomiting. 20 tablet 0  . ONE TOUCH ULTRA TEST test strip     . oxyCODONE-acetaminophen (PERCOCET) 10-325 MG tablet Take 1 tablet by mouth every 4 (four) hours as needed for pain. 60 tablet 0  . Polyethyl Glycol-Propyl Glycol (SYSTANE OP) Apply 1 drop to eye as needed (Dry eyes).    . simvastatin (ZOCOR) 20 MG tablet Take 20 mg by mouth every evening.     . traZODone (DESYREL) 50 MG tablet Take 50-100 mg by mouth at bedtime. Take 1 to 2 tablets at bedtime to help with sleep      Physical Exam: Vitals:   10/01/15 1629  BP: (!) 143/53  Pulse: 99  Resp: 20  Temp: 99.7 F (37.6 C)  A+OX3 NVI Negative st leg raise test Compartments soft/NT Intact 1+ DP/PT pulses Wound: erythema and edema noted - eschar formation NO expressible purulent material  No exposed hardware No SOB/CP Abd Soft/NT   Image: Dg Lumbar Spine 2-3 Views  Result Date: 09/18/2015 CLINICAL DATA:  75 year old female with a history of postop lumbar surgery. EXAM: LUMBAR SPINE - 2-3 VIEW COMPARISON:  Plain film 09/17/2015, CT 12/14/2012 FINDINGS: Surgical changes of posterior lumbar interbody fusion with bilateral pedicle screw and rod fixation spanning L4-S1. There is unilateral pedicle screw on the right at L5. Interbody spacer in place after discectomy of L4-L5. Alignment relatively maintained, with unchanged trace anterolisthesis of L4 on L5. Disc space narrowing with vacuum disc phenomenon at L5-S1 is unchanged. No complicating features. No fracture identified. IMPRESSION: Status post posterior lumbar interbody fusion spanning L4-S1 with unilateral right L5 pedicle screw and interbody spacer at L4-L5. No complicating features. Signed, Dulcy Fanny. Earleen Newport, DO [facility] Electronically Signed   By: Corrie Mckusick D.O.   On: 09/18/2015 08:55   Dg Lumbar Spine 2-3 Views  Result Date: 09/17/2015 CLINICAL DATA:  75 year old female undergoing lumbar surgery. Initial encounter. EXAM: DG C-ARM GT 120 MIN; LUMBAR SPINE - 2-3 VIEW FLUOROSCOPY TIME:  Fluoroscopy Time:  3 minutes 25 seconds Radiation Exposure Index (if provided by the fluoroscopic device): Number of Acquired Spot Images: None COMPARISON:  CT Abdomen and Pelvis 12/14/2012 FINDINGS: Normal lumbar segmentation demonstrated on the 2014 comparison. Four intraoperative fluoroscopic images of the lower lumbar spine are provided. These demonstrate sequelae of transpedicular fusion hardware placement bilaterally at L4, on the right at L5, and bilaterally at S1. There is an L4-L5 interbody implant. Grade 1 anterolisthesis Re demonstrated at L4-L5. IMPRESSION: Postoperative changes at L4-L5 and L5-S1. Electronically Signed   By: Genevie Ann M.D.   On: 09/17/2015 13:55   Dg C-arm Gt 120 Min  Result Date: 09/17/2015 CLINICAL DATA:  75 year old female undergoing lumbar surgery. Initial encounter. EXAM: DG C-ARM GT 120 MIN; LUMBAR SPINE - 2-3 VIEW FLUOROSCOPY TIME:  Fluoroscopy Time:  3 minutes 25 seconds Radiation Exposure Index (if provided by the fluoroscopic device): Number of Acquired Spot Images: None COMPARISON:  CT Abdomen and Pelvis 12/14/2012 FINDINGS: Normal lumbar segmentation demonstrated on the 2014 comparison. Four intraoperative fluoroscopic images of the lower lumbar spine are provided. These demonstrate sequelae of transpedicular fusion hardware placement bilaterally at L4, on the right at L5, and bilaterally at S1. There is an L4-L5 interbody implant. Grade 1 anterolisthesis Re demonstrated at L4-L5. IMPRESSION: Postoperative changes at L4-L5 and L5-S1. Electronically Signed   By: Genevie Ann M.D.   On: 09/17/2015 13:55    A/P: Patient is 2 weeks s/p TLIF for  spinal stenosis with instability.  Patient presents to my office today with increase in back and leg pain, and wound drainage.  She reports only getting out bed and ambulating when HHPT came to the house.  Otherwise she has remained in bed.   Evaluation of wound demonstrates wound breakdown and minimal drainage.  Given history of DM and sedentary activity and potential for spread to infection deep I elected to return to OR for formal I&D and application of wound vac.  Will start abx's after cultures are taken.   Given severity of pain will obtain CT scan during this admission to evaluate screw placement.  Will require SNF d/c as she is not fit for home discharge.

## 2015-10-01 NOTE — Progress Notes (Signed)
Pharmacy Antibiotic Note  Nancy [name] is a 75 y.o. female admitted on 10/01/2015 with lumbar wound infection.  Pharmacy has been consulted for Vancomycin and Zosyn dosing.  Patient currently with leukocytosis and Tm 99.7. SCr is 1.07 (up from baseline of 0.8) with estimated CrCl~45-50 mL/min.   Plan: Vancomycin 1500mg  IV once, then 750mg   IV every 12 hours.  Goal trough 15-20 mcg/mL. Zosyn 3.375g IV q8h (4 hour infusion).  Monitor renal function, culture results, and clinical status.   Height: 5' 1.5" (156.2 cm) Weight: 191 lb 5.8 oz (86.8 kg) IBW/kg (Calculated) : 48.95  Temp (24hrs), Avg:99.7 F (37.6 C), Min:99.7 F (37.6 C), Max:99.7 F (37.6 C)   Recent Labs Lab 10/01/15 1711  WBC 13.0*  CREATININE 1.07*    Estimated Creatinine Clearance: 47.4 mL/min (by C-G formula based on SCr of 1.07 mg/dL (H)).    Allergies  Allergen Reactions  . Codeine Other (See Comments)    headache    Antimicrobials this admission: 9/20 Ancef pre-op 2g IV x1.  9/20 Vancomycin >> 9/20 Zosyn >>  Dose adjustments this admission: na  Microbiology results: 9/20 Lumbar wound cx: 9/20 MRSA pcr: negative  Thank you for allowing pharmacy to be a part of this patient's care.  Sloan Leiter, PharmD, BCPS Clinical Pharmacist [phone] 10/01/2015 6:51 PM

## 2015-10-01 NOTE — Anesthesia Procedure Notes (Signed)
Procedure Name: Intubation Date/Time: 10/01/2015 5:23 PM Performed by: Salli Quarry Denney Shein Pre-anesthesia Checklist: Patient identified, Emergency Drugs available, Suction available and Patient being monitored Patient Re-evaluated:Patient Re-evaluated prior to inductionOxygen Delivery Method: Circle System Utilized Preoxygenation: Pre-oxygenation with 100% oxygen Intubation Type: IV induction Ventilation: Mask ventilation without difficulty Laryngoscope Size: Mac and 3 Grade View: Grade I Tube type: Oral Tube size: 7.0 mm Number of attempts: 1 Airway Equipment and Method: Stylet and Oral airway Placement Confirmation: ETT inserted through vocal cords under direct vision,  positive ETCO2 and breath sounds checked- equal and bilateral Secured at: 22 cm Tube secured with: Tape Dental Injury: Teeth and Oropharynx as per pre-operative assessment

## 2015-10-01 NOTE — Progress Notes (Signed)
CRITICAL VALUE ALERT  Critical value received: K+ 6.3  Date of notification:  10/01/15  Time of notification:  [id number]  Critical value read back:Yes.    Nurse who received alert:  Laurita Quint RN  MD notified (1st page):  Rolena Infante MD  Time of first page:  1758  Time MD responded:  1758

## 2015-10-02 ENCOUNTER — Encounter (HOSPITAL_COMMUNITY): Payer: Self-pay | Admitting: Orthopedic Surgery

## 2015-10-02 DIAGNOSIS — Y838 Other surgical procedures as the cause of abnormal reaction of the patient, or of later complication, without mention of misadventure at the time of the procedure: Secondary | ICD-10-CM

## 2015-10-02 DIAGNOSIS — T783XXA Angioneurotic edema, initial encounter: Secondary | ICD-10-CM

## 2015-10-02 DIAGNOSIS — Z981 Arthrodesis status: Secondary | ICD-10-CM

## 2015-10-02 DIAGNOSIS — E119 Type 2 diabetes mellitus without complications: Secondary | ICD-10-CM

## 2015-10-02 DIAGNOSIS — T8131XA Disruption of external operation (surgical) wound, not elsewhere classified, initial encounter: Principal | ICD-10-CM

## 2015-10-02 DIAGNOSIS — E118 Type 2 diabetes mellitus with unspecified complications: Secondary | ICD-10-CM

## 2015-10-02 DIAGNOSIS — T783XXD Angioneurotic edema, subsequent encounter: Secondary | ICD-10-CM

## 2015-10-02 LAB — GLUCOSE, CAPILLARY
GLUCOSE-CAPILLARY: 187 mg/dL — AB (ref 65–99)
Glucose-Capillary: 137 mg/dL — ABNORMAL HIGH (ref 65–99)
Glucose-Capillary: 100 mg/dL — ABNORMAL HIGH (ref 65–99)
Glucose-Capillary: 150 mg/dL — ABNORMAL HIGH (ref 65–99)
Glucose-Capillary: 206 mg/dL — ABNORMAL HIGH (ref 65–99)

## 2015-10-02 MED ORDER — DEXTROSE 5 % IV SOLN
2.0000 g | INTRAVENOUS | Status: DC
  Administered 2015-10-02 – 2015-10-04 (×3): 2 g via INTRAVENOUS
  Filled 2015-10-02 (×4): qty 2

## 2015-10-02 MED ORDER — DIPHENHYDRAMINE HCL 50 MG/ML IJ SOLN
25.0000 mg | Freq: Four times a day (QID) | INTRAMUSCULAR | Status: DC | PRN
  Administered 2015-10-05 – 2015-10-07 (×3): 25 mg via INTRAVENOUS
  Filled 2015-10-02 (×3): qty 1

## 2015-10-02 MED ORDER — INSULIN ASPART 100 UNIT/ML ~~LOC~~ SOLN
0.0000 [IU] | Freq: Three times a day (TID) | SUBCUTANEOUS | Status: DC
  Administered 2015-10-03: 2 [IU] via SUBCUTANEOUS
  Administered 2015-10-03: 5 [IU] via SUBCUTANEOUS
  Administered 2015-10-04: 2 [IU] via SUBCUTANEOUS
  Administered 2015-10-04: 3 [IU] via SUBCUTANEOUS
  Administered 2015-10-05: 2 [IU] via SUBCUTANEOUS
  Administered 2015-10-05 – 2015-10-06 (×2): 5 [IU] via SUBCUTANEOUS
  Administered 2015-10-06: 3 [IU] via SUBCUTANEOUS

## 2015-10-02 MED ORDER — INSULIN ASPART 100 UNIT/ML ~~LOC~~ SOLN
0.0000 [IU] | SUBCUTANEOUS | Status: DC
  Administered 2015-10-02: 2 [IU] via SUBCUTANEOUS
  Administered 2015-10-02: 5 [IU] via SUBCUTANEOUS
  Administered 2015-10-02: 2 [IU] via SUBCUTANEOUS

## 2015-10-02 MED ORDER — EPINEPHRINE HCL 0.1 MG/ML IJ SOSY
PREFILLED_SYRINGE | INTRAMUSCULAR | Status: AC
  Administered 2015-10-02: 01:00:00
  Filled 2015-10-02: qty 10

## 2015-10-02 MED ORDER — EPINEPHRINE HCL 1 MG/ML IJ SOLN: 0.3000 mg | Freq: Once | INTRAMUSCULAR | Status: AC

## 2015-10-02 MED ORDER — SODIUM CHLORIDE 0.9 % IV SOLN
INTRAVENOUS | Status: DC
  Administered 2015-10-02 – 2015-10-06 (×2): via INTRAVENOUS

## 2015-10-02 MED ORDER — HYDROMORPHONE HCL 1 MG/ML IJ SOLN
0.5000 mg | INTRAMUSCULAR | Status: DC | PRN
  Administered 2015-10-02 (×2): 0.5 mg via INTRAVENOUS
  Filled 2015-10-02 (×2): qty 1

## 2015-10-02 MED ORDER — INSULIN ASPART 100 UNIT/ML ~~LOC~~ SOLN
0.0000 [IU] | Freq: Every day | SUBCUTANEOUS | Status: DC
  Administered 2015-10-04: 2 [IU] via SUBCUTANEOUS

## 2015-10-02 MED ORDER — MAGNESIUM CITRATE PO SOLN
0.5000 | Freq: Once | ORAL | Status: AC
  Administered 2015-10-07: 0.5 via ORAL
  Filled 2015-10-02: qty 296

## 2015-10-02 NOTE — Evaluation (Signed)
Occupational Therapy Evaluation Patient Details Name: KEMONIE DUCA MRN: [id number] DOB: 11-25-1940 Today's Date: 10/02/2015    History of Present Illness Patient is a 75 y/o female admitted with surgical wound infection.  hx of DM, HTN, HLD, diastolic dysfunction, s/p L4-5 TLIF, posterior fusion L5-S1 on 9/6.   Clinical Impression   Pt was assisted for ADL and ambulating minimally with RW since her surgery. Pt readily willing to work with therapies today and initiated extending walking distance. Requires min to max assist for ADL and min assist for mobility. Recommending SNF for further therapy prior to return home with pt in agreement.    Follow Up Recommendations  SNF;Supervision/Assistance - 24 hour    Equipment Recommendations  None recommended by OT    Recommendations for Other Services       Precautions / Restrictions Precautions Precautions: Back;Fall Precaution Booklet Issued: No Precaution Comments: pt able to recall 2/3 back precautions, pt with wound vac Required Braces or Orthoses: Spinal Brace Spinal Brace: Lumbar corset;Applied in sitting position Restrictions Weight Bearing Restrictions: No      Mobility Bed Mobility Overal bed mobility: Needs Assistance Bed Mobility: Rolling;Sidelying to Sit Rolling: Supervision Sidelying to sit: Min assist;HOB elevated       General bed mobility comments: pt using log roll technique, min assist to elevate trunk, increased time  Transfers Overall transfer level: Needs assistance Equipment used: Rolling walker (2 wheeled) Transfers: Sit to/from Stand Sit to Stand: Min guard;Min assist         General transfer comment: min from bed to steady, min guard from 3 in 1, cues for hand placement    Balance     Sitting balance-Leahy Scale: Fair       Standing balance-Leahy Scale: Poor                              ADL Overall ADL's : Needs assistance/impaired Eating/Feeding: Set up;Sitting   Grooming: Set up;Sitting   Upper Body Bathing: Minimal assitance;Sitting   Lower Body Bathing: Maximal assistance;Sit to/from stand   Upper Body Dressing : Minimal assistance;Sitting   Lower Body Dressing: Maximal assistance;Sit to/from stand Lower Body Dressing Details (indicate cue type and reason): unable to reach feet or cross ankle-over-knee Toilet Transfer: Minimal assistance;+2 for safety/equipment;Ambulation;BSC;RW   Toileting- Clothing Manipulation and Hygiene: Minimal assistance;Sit to/from stand Toileting - Clothing Manipulation Details (indicate cue type and reason): performed pericare after urinating, assist for buttocks     Functional mobility during ADLs: Min guard;Rolling walker;+2 for safety/equipment General ADL Comments: Pt readily willing to get OOB, requested to walk further distance.     Vision Vision Assessment?: No apparent visual deficits   Perception     Praxis      Pertinent Vitals/Pain Pain Assessment: Faces Faces Pain Scale: Hurts even more Pain Location: back Pain Descriptors / Indicators: Grimacing;Guarding;Moaning Pain Intervention(s): Monitored during session;Premedicated before session;Repositioned     Hand Dominance Right   Extremity/Trunk Assessment Upper Extremity Assessment Upper Extremity Assessment: Overall WFL for tasks assessed   Lower Extremity Assessment Lower Extremity Assessment: Defer to PT evaluation       Communication Communication Communication: Expressive difficulties (low volume, slightly difficult to understand/tongue edema)   Cognition Arousal/Alertness: Awake/alert Behavior During Therapy: Flat affect Overall Cognitive Status: Within Functional Limits for tasks assessed                 General Comments: somewhat slow processing   General  Comments       Exercises       Shoulder Instructions      Home Living Family/patient expects to be discharged to:: Private residence Living Arrangements:  Children Available Help at Discharge: Family;Available 24 hours/day Type of Home: House Home Access: Stairs to enter [employer] of Steps: 1   Home Layout: Two level;Able to live on main level with bedroom/bathroom     Bathroom Shower/Tub: Occupational psychologist: Standard     Home Equipment: Shower seat - built in;Walker - 2 wheels;Cane - single point;Bedside commode;Hospital bed          Prior Functioning/Environment Level of Independence: Needs assistance  Gait / Transfers Assistance Needed: ambulating with a walker short distances with HHPT ADL's / Homemaking Assistance Needed: Daughter was assisting with ADL and performing all IADL. Pt used BSC.            OT Problem List: Decreased strength;Impaired balance (sitting and/or standing);Decreased safety awareness;Decreased knowledge of precautions;Decreased knowledge of use of DME or AE;Pain   OT Treatment/Interventions: Self-care/ADL training;DME and/or AE instruction;Therapeutic activities;Patient/family education;Balance training    OT Goals(Current goals can be found in the care plan section) Acute Rehab OT Goals Patient Stated Goal: pt agreeable to further therapy in SNF OT Goal Formulation: With patient Time For Goal Achievement: 10/16/15 Potential to Achieve Goals: Good ADL Goals Pt Will Perform Grooming: with supervision;standing Pt Will Perform Lower Body Bathing: with supervision;with adaptive equipment;sit to/from stand Pt Will Perform Lower Body Dressing: with supervision;with adaptive equipment;sit to/from stand Pt Will Transfer to Toilet: with supervision;ambulating;bedside commode (over toilet) Pt Will Perform Toileting - Clothing Manipulation and hygiene: with supervision;sit to/from stand Additional ADL Goal #1: Pt will generalize back precautions in ADL and mobility independently.  OT Frequency: Min 2X/week   Barriers to D/C:            Co-evaluation PT/OT/SLP  Co-Evaluation/Treatment: Yes Reason for Co-Treatment: For patient/therapist safety   OT goals addressed during session: ADL's and self-care      End of Session Equipment Utilized During Treatment: Back brace;Gait belt Nurse Communication: Mobility status  Activity Tolerance: Patient tolerated treatment well Patient left: in chair;with call bell/phone within reach   Time: [id number] OT Time Calculation (min): 38 min Charges:  OT General Charges $OT Visit: 1 Procedure OT Evaluation $OT Eval Moderate Complexity: 1 Procedure G-Codes:    Malka So 10/02/2015, 10:13 AM  [phone]

## 2015-10-02 NOTE — Progress Notes (Signed)
    Subjective: 1 Day Post-Op Procedure(s) (LRB): LUMBAR WOUND DEBRIDEMENT (N/A) Patient reports pain as 4 on 0-10 scale.   Denies CP or SOB.  Voiding without difficulty. Positive flatus.  Pt laughing and joking today.  Significant improvement from yesterday. Objective: Vital signs in last 24 hours: Temp:  [97.5 F (36.4 C)-99.7 F (37.6 C)] 98.7 F (37.1 C) (09/21 1003) Pulse Rate:  [80-99] 85 (09/21 1149) Resp:  [10-20] 16 (09/21 0951) BP: (120-150)/(46-96) 133/58 (09/21 1149) SpO2:  [85 %-100 %] 96 % (09/21 1029) Weight:  [86.8 kg (191 lb 5.8 oz)] 86.8 kg (191 lb 5.8 oz) (09/20 1629)  Intake/Output from previous day: 09/20 0701 - 09/21 0700 In: 1137.5 [I.V.:1137.5] Out: 175 [Drains:125; Blood:50] Intake/Output this shift: No intake/output data recorded.  Labs:  Recent Labs  10/01/15 1711  HGB 9.6*    Recent Labs  10/01/15 1711  WBC 13.0*  RBC 3.62*  HCT 29.3*  PLT 534*    Recent Labs  10/01/15 1711 10/01/15 2044  NA 133* 135  K 6.3* 3.9  CL 97* 99*  CO2 25 27  BUN 11 11  CREATININE 1.07* 1.00  GLUCOSE 142* 164*  CALCIUM 9.4 9.0    Recent Labs  10/01/15 1711  INR 1.18    Physical Exam: Neurologically intact ABD soft Sensation intact distally Dorsiflexion/Plantar flexion intact Incision: pt has wound vac Compartment soft  Assessment/Plan: 1 Day Post-Op Procedure(s) (LRB): LUMBAR WOUND DEBRIDEMENT (N/A) Advance diet Up with therapy  Wound vac dressing will be changed on Friday Consider SNF placement   [place], Darla Lesches for Dr. Melina Schools [facility] [phone] 10/02/2015, 1:04 PM    Patient ID: Nancy [name], female   DOB: 02-07-1940, 75 y.o.   MRN: [id number]

## 2015-10-02 NOTE — Anesthesia Postprocedure Evaluation (Signed)
Anesthesia Post Note  Patient: Nancy [name]  Procedure(s) Performed: Procedure(s) (LRB): LUMBAR WOUND DEBRIDEMENT (N/A)  Patient location during evaluation: PACU Anesthesia Type: General Level of consciousness: awake and alert Pain management: pain level controlled Vital Signs Assessment: post-procedure vital signs reviewed and stable Respiratory status: spontaneous breathing, nonlabored ventilation, respiratory function stable and patient connected to nasal cannula oxygen Cardiovascular status: blood pressure returned to baseline and stable Postop Assessment: no signs of nausea or vomiting Anesthetic complications: no    Last Vitals:  Vitals:   10/02/15 0130 10/02/15 0615  BP: (!) 133/96 (!) 120/52  Pulse: 85 80  Resp: 16 16  Temp: 36.7 C 36.7 C    Last Pain:  Vitals:   10/02/15 0809  TempSrc:   PainSc: [place], [place] E

## 2015-10-02 NOTE — Progress Notes (Signed)
Called MD on call b/c pt was given IV Toradol and Morphine @2257  for pain. Upon returning to pt room to assess pt's pain @2350  RN noticed that the pt's tongue was swollen. Pt didn't have and respiratory distress no stridor or hypoxia. Vital signs was taking BP=133/55 O2 Sat=100/2L HR=86. Orders where giving to give pt 25mg  IV Benadryl and Epi pen and placed on tele and continue pulse. RN will continue to monitor.

## 2015-10-02 NOTE — Op Note (Signed)
NAMENOBIA, WOLTHUIS           ACCOUNT NO.:  [id number]  MEDICAL RECORD NO.:  [id number]  LOCATION:  [id number]                        FACILITY:  [facility]  PHYSICIAN:  Daivion Pape D. Rolena Infante, M.D. DATE OF BIRTH:  August 19, 1940  DATE OF PROCEDURE:  10/01/2015 DATE OF DISCHARGE:                              OPERATIVE REPORT   PREOPERATIVE DIAGNOSIS:  Wound dehiscence.  POSTOPERATIVE DIAGNOSIS:  Wound dehiscence.  OPERATIVE PROCEDURE:  Incision and drainage, and application of wound VAC.  COMPLICATIONS:  None.  CONDITION:  Stable.  HISTORY:  This is a very pleasant 75 year old woman who 2 weeks ago had a TLIF at L4-5.  She was ultimately discharged home, doing well, and then over the last 10 days, she has essentially been at bed rest, refusing to get out of bed.  She has gotten out of bed only when the home health PT was there.  Otherwise, she has been lying directly on the wound.  She presents today in my office with evidence of an early stage decubitus ulcer in the sacrum as well as pressure necrosis of the surgical wounds.  As a result, I elected to take her to the operating room to aggressively debride, apply wound VAC, in hopes of preventing a deeper wound infection and the need for removal of hardware especially given her underlying diabetes.  All appropriate risks, benefits, and alternatives were explained to the patient and consent was obtained.  OPERATIVE NOTE:  Patient was brought to the operating room and placed supine on the operating table.  After successful induction of general anesthesia and endotracheal intubation, TEDs, SCDs were applied and she was turned prone onto a Wilson frame.  All bony prominences were well padded and the back was prepped and draped in a standard fashion.  Time- out was taken confirming patient, procedure, and all other pertinent important data.  The Monocryl sutures were removed and the wound edges were cleaned and freshened with a 15-blade  scalpel.  I deeply debrided the subcutaneous adipose tissue which did appear slightly necrotic.  The deep fascia was palpated in both incision sites and the sutures were removed.  There was no purulent drainage deep to the deep fascia.  I did take cultures of each site and then antibiotics were given.  I then irrigated with 4 L of normal saline through the wounds.  I then placed a wound VAC and then the sticky dressing.  I then also placed a sacral dressing to pad the sacral area in hopes of preventing further breakdown.  The patient was ultimately extubated, transferred to the PACU without incident.  At the end of the case, all needle and sponge counts were correct.  There were no adverse intraoperative events.     Chanda Laperle D. Rolena Infante, M.D.     DDB/MEDQ  D:  10/01/2015  T:  10/02/2015  Job:  [id number]

## 2015-10-02 NOTE — Care Management Note (Addendum)
Case Management Note  Patient Details  Name: Nancy [name] MRN: [id number] Date of Birth: [birth year]  Subjective/Objective:                    Action/Plan: OT recommends SNF , consulted SW  Consulted for home health needs. Patient recently discharged with hospital bed, 3 n 1 and walker , and HHPT and aide through [place] [facility] .   Patient currently has VAC in place. VAC application form placed on chart . If home VAC needed MD please sign form. Will need wound measurements .  Awaiting PT/OT evals , if patient discharges home will need orders for home health PT/OT/RN/aide and VAC form signed . Thanks Magdalen Spatz RN BSN [phone]  Expected Discharge Date:                  Expected Discharge Plan:     In-House Referral:     Discharge planning Services  CM Consult  Post Acute Care Choice:  Home Health Choice offered to:     DME Arranged:    DME Agency:     HH Arranged:    HH Agency:     Status of Service:  In process, will continue to follow  If discussed at Long Length of Stay Meetings, dates discussed:    Additional Comments:  Nancy Favre, RN 10/02/2015, 10:03 AM

## 2015-10-02 NOTE — Consult Note (Signed)
[facility] Medical Consultation  Nancy [name] [id number] DOB: January 04, 1941 DOA: 10/01/2015 PCP: Antony Blackbird, MD   Requesting physician: Melina Schools, MD Date of consultation: 10/01/2015. Reason for consultation: Medical management. Chief Complaint: Wound infection  Brief Summary:  75 year old female with a past medical history of GERD, PUD, diastolic CHF, pulmonary hypertension, essential hypertension, gout, hyperlipidemia, type 2 diabetes, osteoarthritis, osteopenia who is 2 weeks s/p TLIF. aAdmitted with healing complications with drainage and dehiscence of wound. She just underwent a formal I&D and application of wound VAC by Dr. Rolena Infante and we are being consulted for medical management.  Patient seen and examined today at bedside. Patient continues to c/o back pain from the procedure. Overnight had an episode of angioedema, which has resolved. ACEi was d/ced. Patient eating well with no complaints.  She denies fever, chest pain, dyspnea, palpitations, pitting edema lower extremities, abdominal pain, nausea, emesis, diarrhea or constipation.   Impression/Recommendations:  Angioedema - Resolved  Patient eating and breathing room air. Likely 2/2 ACEi vs NSAIDs allergic rx.  ACEi hold - ? If ARBS needed  Can d/c telemetry and continues pulse ox   Wound infection (HCC) - Cultures no growth UTD Continue treatment and postop care per Dr. Rolena Infante. Zosyn and Vanc will consult ID for further recommendation  Follow-up wound culture and sensitivity  Essential hypertension, benign - BP stable  Lisinopril was held due to angioedema - last night which has resolved.  Continue amlodipine 10 mg by mouth daily. Continue metoprolol 100 mg by mouth daily. Continue furosemide 20 mg by mouth daily. Monitor blood pressure periodically.  Diastolic dysfunction Continue furosemide and beta blocker. Lisinopril held due to angioedema.  Back pain Continue Norco, oxycodone and  hydromorphone as needed. Continue Robaxin as needed.   Gout - Stable  Continue colchicine 0.6 milligram by mouth daily. Continue allopurinol 200 mg by mouth daily.  Hyperlipidemia - stable  Follow-up with PCP.  Type 2 diabetes mellitus (HCC) - stable Carbohydrate modified diet. Continue glipizide 5 mg by mouth daily before breakfast.  Past Medical History:  Diagnosis Date  . Breast cyst    left breast  . Chest pain   . Complication of anesthesia 1986   slow to awaken after cholecystectomy  . Diastolic dysfunction    with mild pulmonary HTN by echo 07/2011  . Gastric ulcer   . GERD (gastroesophageal reflux disease)   . Gout   . Hyperlipidemia   . Hypertension   . Osteoarthritis   . Osteopenia   . Shortness of breath dyspnea    with activity  . Spine deformity    lower spine  . Type II diabetes mellitus ([place])    Past Surgical History:  Procedure Laterality Date  . ABDOMINAL ADHESION SURGERY    . ABDOMINAL HYSTERECTOMY    . ANUS SURGERY     for torn tissue  . APPENDECTOMY    . BREAST BIOPSY    . BREAST LUMPECTOMY     left  . CHOLECYSTECTOMY  1986  . COLONOSCOPY    . ESOPHAGOGASTRODUODENOSCOPY (EGD) WITH PROPOFOL N/A 07/18/2013   Procedure: ESOPHAGOGASTRODUODENOSCOPY (EGD) WITH PROPOFOL;  Surgeon: Arta Silence, MD;  Location: [facility];  Service: Endoscopy;  Laterality: N/A;  . EUS N/A 07/18/2013   Procedure: ESOPHAGEAL ENDOSCOPIC ULTRASOUND (EUS) RADIAL;  Surgeon: Arta Silence, MD;  Location: [facility];  Service: Endoscopy;  Laterality: N/A;  . LUMBAR WOUND DEBRIDEMENT N/A 10/01/2015   Procedure: LUMBAR WOUND DEBRIDEMENT;  Surgeon: Melina Schools, MD;  Location: [facility];  Service: Orthopedics;  Laterality: N/A;  . MASS EXCISION Left 07/24/2015   Procedure: EXCISION MASS LEFT LONG FINGER;  Surgeon: Leanora Cover, MD;  Location: [facility];  Service: Orthopedics;  Laterality: Left;  . SPINAL FUSION N/A 09/17/2015   Procedure: FUSION POSTERIOR SPINAL  MULTILEVEL L4- S1;  Surgeon: Melina Schools, MD;  Location: [place];  Service: Orthopedics;  Laterality: N/A;  . TRANSFORAMINAL LUMBAR INTERBODY FUSION (TLIF) WITH PEDICLE SCREW FIXATION 1 LEVEL N/A 09/17/2015   Procedure: TRANSFORAMINAL LUMBAR INTERBODY FUSION (TLIF) WITH PEDICLE SCREW FIXATION 1 LEVEL , Lumbar 4-5;  Surgeon: Melina Schools, MD;  Location: [facility];  Service: Orthopedics;  Laterality: N/A;   Social History:  reports that she has never smoked. She has never used smokeless tobacco. She reports that she drinks alcohol. She reports that she does not use drugs.  Allergies  Allergen Reactions  . Morphine And Related Swelling    10/02/15 - given Toradol + morphine and RN noted tongue swelling - given Epi + Benadryl Has had morphine in past w/o problem  . Toradol [Ketorolac Tromethamine] Swelling    10/02/15 - given Toradol + morphine and RN noted tongue swelling - given Epi + Benadryl Has taken ibuprofen in past w/o problem  . Codeine Other (See Comments)    headache   Family History  Problem Relation Age of Onset  . Heart disease Mother   . Cancer Sister     ovarian    Prior to Admission medications   Medication Sig Start Date End Date Taking? Authorizing Provider  allopurinol (ZYLOPRIM) 100 MG tablet Take 200 mg by mouth daily.    Yes Historical Provider, MD  amLODipine (NORVASC) 10 MG tablet Take 10 mg by mouth every morning.    Yes Historical Provider, MD  aspirin EC 81 MG tablet Take 81 mg by mouth daily.   Yes Historical Provider, MD  colchicine (COLCRYS) 0.6 MG tablet Take 0.6 mg by mouth daily as needed (Gout).    Yes Historical Provider, MD  furosemide (LASIX) 20 MG tablet TAKE ONE TABLET BY MOUTH ONCE DAILY Patient taking differently: Take 20 mg by mouth once daily 06/04/14  Yes Traci R Turner, MD  glipiZIDE (GLUCOTROL) 5 MG tablet Take 5 mg by mouth daily before breakfast.   Yes Historical Provider, MD  lisinopril (PRINIVIL,ZESTRIL) 40 MG tablet Take 40 mg by mouth every  morning.    Yes Historical Provider, MD  metFORMIN (GLUCOPHAGE) 1000 MG tablet Take 1,000 mg by mouth 2 (two) times daily with a meal.     Yes Historical Provider, MD  metoprolol (LOPRESSOR) 100 MG tablet Take 100 mg by mouth every morning.    Yes Historical Provider, MD  omeprazole (PRILOSEC) 20 MG capsule Take 20 mg by mouth daily.   Yes Historical Provider, MD  simvastatin (ZOCOR) 20 MG tablet Take 20 mg by mouth every evening.    Yes Historical Provider, MD  traZODone (DESYREL) 50 MG tablet Take 50-100 mg by mouth at bedtime as needed for sleep.    Yes Historical Provider, MD  Polyethyl Glycol-Propyl Glycol (SYSTANE OP) Apply 1 drop to eye as needed (Dry eyes).    Historical Provider, MD   Physical Exam: Blood pressure (!) 102/52, pulse 79, temperature 98.3 F (36.8 C), temperature source Oral, resp. rate 16, height 5' 1.5" (1.562 m), weight 86.8 kg (191 lb 5.8 oz), SpO2 98 %. Vitals:   10/02/15 1149 10/02/15 1404  BP: (!) 133/58 (!) 102/52  Pulse: 85 79  Resp:    Temp:  98.3 F (36.8 C)    General:  Afebrile, Mild distress due to pain   ENT: OP clear and patent   Neck: Supple, no JVD.   Cardiovascular: S1-S2 RRR.   Respiratory: CTA bilaterally.   Abdomen: BS positive, soft, nontender.   Skin: Dressing and wound VAC in place.   Musculoskeletal: No clubbing or cyanosis.        Neurologic: Awake, alert, oriented 3, moves all extremities, but unable to fully evaluate due to back pain.  Labs on Admission:  Basic Metabolic Panel:  Recent Labs Lab 10/01/15 1711 10/01/15 2044  NA 133* 135  K 6.3* 3.9  CL 97* 99*  CO2 25 27  GLUCOSE 142* 164*  BUN 11 11  CREATININE 1.07* 1.00  CALCIUM 9.4 9.0  MG  --  1.3*  PHOS  --  4.7*   Liver Function Tests:  Recent Labs Lab 10/01/15 2044  AST 18  ALT 12*  ALKPHOS 77  BILITOT 0.5  PROT 7.4  ALBUMIN 2.8*   No results for input(s): LIPASE, AMYLASE in the last 168 hours. No results for input(s): AMMONIA in the last  168 hours. CBC:  Recent Labs Lab 10/01/15 1711  WBC 13.0*  NEUTROABS 9.6*  HGB 9.6*  HCT 29.3*  MCV 80.9  PLT 534*   Cardiac Enzymes: No results for input(s): CKTOTAL, CKMB, CKMBINDEX, TROPONINI in the last 168 hours. BNP: Invalid input(s): POCBNP CBG:  Recent Labs Lab 10/01/15 1831 10/01/15 2208 10/02/15 0407 10/02/15 0746 10/02/15 1210  GLUCAP 133* 143* 137* 100* 150*    Radiological Exams on Admission: No results found.   Chipper Oman [facility] Pager [phone].  If 7PM-7AM, please contact night-coverage [email or web address] Password [facility] 10/02/2015, 3:34 PM

## 2015-10-02 NOTE — Significant Event (Signed)
Rapid Response Event Note  Overview: Time Called: 0005 Arrival Time: 0015 Event Type: Respiratory   Call received per floor RN regarding Pt with new tongue and facial swelling after administration of Toradol and morphine about 1 hr ago. Surgeon on call and [facility] paged. Benadryl ordered x 1 per [facility] NP. Pt seen at 0015 resting in bed, follows commands, no respiratory distress. Placed on continuous pulse ox yielding 98-100% on 2 L [place]. VSS.  Pt nods head to trouble swallowing, small amount of clear sputum suction from oral cavity. Tongue is  very large and Pt unable to close mouth. No swelling in neck or rash assessed. No stridor noted. Benadryl given 25 mg IVP. Advised RN to page [facility] NP to request bedside consult to determine if Pt ok to remain on med surg due to concerns over her airway.  RN to monitor closely and update Provider and myself for worsening changes.     Event Summary: Name of Physician Notified: Alanda Slim at [facility]  Name of Consulting Physician Notified: Tylene Fantasia NP [facility] to bedside at    Outcome: Stayed in room and stabalized  Event End Time: 0030  Ranell Patrick Lautaro Koral

## 2015-10-02 NOTE — Progress Notes (Signed)
Pharmacy Antibiotic Note  Nancy [name] is a 75 y.o. female admitted on 10/01/2015 with lumbar wound infection.  Continues on vancomycin with zosyn >> ceftriaxone.  Remains afebrile, with WBC mildly elevated at 13.0 as of 9/20. Wound cultures NGTD x1 day. SCr 1.00 (up from baseline of 0.8) with estimated CrCl~50 mL/min.   Plan: -D/C Zosyn -Start ceftriaxone 2g q24h -Continue vancomycin 750mg  q12h -Monitor renal function, culture results, and clinical course   Height: 5' 1.5" (156.2 cm) Weight: 191 lb 5.8 oz (86.8 kg) IBW/kg (Calculated) : 48.95  Temp (24hrs), Avg:98.4 F (36.9 C), Min:97.9 F (36.6 C), Max:99.2 F (37.3 C)   Recent Labs Lab 10/01/15 1711 10/01/15 2044  WBC 13.0*  --   CREATININE 1.07* 1.00    Estimated Creatinine Clearance: 50.7 mL/min (by C-G formula based on SCr of 1 mg/dL).    Allergies  Allergen Reactions  . Morphine And Related Swelling    10/02/15 - given Toradol + morphine and RN noted tongue swelling - given Epi + Benadryl Has had morphine in past w/o problem  . Toradol [Ketorolac Tromethamine] Swelling    10/02/15 - given Toradol + morphine and RN noted tongue swelling - given Epi + Benadryl Has taken ibuprofen in past w/o problem  . Codeine Other (See Comments)    headache    Antimicrobials this admission: 9/21 Ceftriaxone >> 9/20 Vancomycin >> 9/20 Zosyn >> 9/21 9/20 Ancef pre-op 2g IV x1  Dose adjustments this admission: na  Microbiology results: 9/20 Lumbar wound cx: NGTD x1 day 9/20 MRSA pcr: negative  Thank you for allowing pharmacy to be a part of this patient's care.  Stephens November, PharmD Clinical Pharmacist 6:33 PM, 10/02/2015  10/02/2015 6:27 PM

## 2015-10-02 NOTE — Consult Note (Signed)
[facility] for Infectious Disease  Date of Admission:  10/01/2015  Date of Consult:  10/02/2015  Reason for Consult: post op wound dehiscence  Referring Physician: Dr. Melina Schools   Impression/Recommendation Nancy [name] op wound dehiscence - unclear if this was related to infection or multifactorial in the setting of DM and immobility  Uncontrolled DM (HbA1c 8/30 8.6)  Continue vancomycin  Switch zosyn to ceftriaxone  Follow up intraoperative wound culture   Thank you so much for this interesting consult,   Nancy [name] (pager) [phone] [email or web address]  Nancy [name] is an 75 y.o. female. PMH lumbar spinal stenosis, type 2 diabetes mellitus ( HbA1c 8/30 8.6), gout, osteoarthritis diastolic CHF, GERD, HTN, HLD.  HPI: She had posterior spinal fusion surgery 9/6. Presented to 2 week follow up appointment yesterday and was found to have wound dehiscence. Wound debridement was performed and 2 wound vacs were placed. When she returned home from the initial surgery she continued to have back pain but did not notice that the wound wasn't healing and she doesn't remember if she had fever or chills. She denies fever, dysuria, shortness of breath, cough, headaches, changes in vision, peripheral numbness or tingling. On admission she had stable vital signs and WBC 13,000.    Past Medical History:  Diagnosis Date  . Breast cyst    left breast  . Chest pain   . Complication of anesthesia 1986   slow to awaken after cholecystectomy  . Diastolic dysfunction    with mild pulmonary HTN by echo 07/2011  . Gastric ulcer   . GERD (gastroesophageal reflux disease)   . Gout   . Hyperlipidemia   . Hypertension   . Osteoarthritis   . Osteopenia   . Shortness of breath dyspnea    with activity  . Spine deformity    lower spine  . Type II diabetes mellitus ([place])     Past Surgical History:  Procedure Laterality Date  . ABDOMINAL ADHESION SURGERY    . ABDOMINAL HYSTERECTOMY    .  ANUS SURGERY     for torn tissue  . APPENDECTOMY    . BREAST BIOPSY    . BREAST LUMPECTOMY     left  . CHOLECYSTECTOMY  1986  . COLONOSCOPY    . ESOPHAGOGASTRODUODENOSCOPY (EGD) WITH PROPOFOL N/A 07/18/2013   Procedure: ESOPHAGOGASTRODUODENOSCOPY (EGD) WITH PROPOFOL;  Surgeon: Arta Silence, MD;  Location: [facility];  Service: Endoscopy;  Laterality: N/A;  . EUS N/A 07/18/2013   Procedure: ESOPHAGEAL ENDOSCOPIC ULTRASOUND (EUS) RADIAL;  Surgeon: Arta Silence, MD;  Location: [facility];  Service: Endoscopy;  Laterality: N/A;  . LUMBAR WOUND DEBRIDEMENT N/A 10/01/2015   Procedure: LUMBAR WOUND DEBRIDEMENT;  Surgeon: Melina Schools, MD;  Location: [facility];  Service: Orthopedics;  Laterality: N/A;  . MASS EXCISION Left 07/24/2015   Procedure: EXCISION MASS LEFT LONG FINGER;  Surgeon: Leanora Cover, MD;  Location: [facility] [place];  Service: Orthopedics;  Laterality: Left;  . SPINAL FUSION N/A 09/17/2015   Procedure: FUSION POSTERIOR SPINAL MULTILEVEL L4- S1;  Surgeon: Melina Schools, MD;  Location: [facility];  Service: Orthopedics;  Laterality: N/A;  . TRANSFORAMINAL LUMBAR INTERBODY FUSION (TLIF) WITH PEDICLE SCREW FIXATION 1 LEVEL N/A 09/17/2015   Procedure: TRANSFORAMINAL LUMBAR INTERBODY FUSION (TLIF) WITH PEDICLE SCREW FIXATION 1 LEVEL , Lumbar 4-5;  Surgeon: Melina Schools, MD;  Location: [facility];  Service: Orthopedics;  Laterality: N/A;     Allergies  Allergen Reactions  . Morphine And Related Swelling  10/02/15 - given Toradol + morphine and RN noted tongue swelling - given Epi + Benadryl Has had morphine in past w/o problem  . Toradol [Ketorolac Tromethamine] Swelling    10/02/15 - given Toradol + morphine and RN noted tongue swelling - given Epi + Benadryl Has taken ibuprofen in past w/o problem  . Codeine Other (See Comments)    headache    Medications: I have reviewed the patient's current medications.  Abtx:  Anti-infectives    Start     Dose/Rate Route Frequency Ordered Stop    10/02/15 0830  vancomycin (VANCOCIN) IVPB 750 mg/150 ml premix     750 mg 150 mL/hr over 60 Minutes Intravenous Every 12 hours 10/01/15 2022     10/01/15 2100  ceFAZolin (ANCEF) IVPB 1 g/50 mL premix     1 g 100 mL/hr over 30 Minutes Intravenous Every 8 hours 10/01/15 2037 10/02/15 0607   10/01/15 2030  vancomycin (VANCOCIN) 1,500 mg in sodium chloride 0.9 % 500 mL IVPB     1,500 mg 250 mL/hr over 120 Minutes Intravenous  Once 10/01/15 2022 10/01/15 2322   10/01/15 2030  piperacillin-tazobactam (ZOSYN) IVPB 3.375 g     3.375 g 12.5 mL/hr over 240 Minutes Intravenous Every 8 hours 10/01/15 2022        Total days of antibiotics: 2 vanco/zosyn           Social History:  reports that she has never smoked. She has never used smokeless tobacco. She reports that she drinks alcohol. She reports that she does not use drugs.  Family History  Problem Relation Age of Onset  . Heart disease Mother   . Cancer Sister     ovarian    General: denies fever or weakness  Neuro: denies headache, recent changes in vision, dizziness, tingling or numbness  Pulm: denies shortness of breath or cough  Cardiac: denies chest pain or palpitations  GI: denies changes in appetite  GU: denies dysuria  MSK: positive for back pain   Blood pressure (!) 102/52, pulse 79, temperature 98.3 F (36.8 C), temperature source Oral, resp. rate 16, height 5' 1.5" (1.562 m), weight 191 lb 5.8 oz (86.8 kg), SpO2 98 %. BP (!) 102/52 (BP Location: Right Arm)   Pulse 79   Temp 98.3 F (36.8 C) (Oral)   Resp 16   Ht 5' 1.5" (1.562 m)   Wt 191 lb 5.8 oz (86.8 kg)   SpO2 98%   BMI 35.57 kg/m   General Appearance:    Alert, cooperative, no distress, appears stated age  Head:    Normocephalic, without obvious abnormality, atraumatic  Eyes:    PERRL, conjunctiva/corneas clear, EOM's intact     Nose:   Nares normal, no drainage   Throat:   Lips, mucosa, and tongue normal; gums normal     Back:     Symmetric, no  curvature, ROM normal, no CVA tenderness  Lungs:     Clear to auscultation bilaterally, respirations unlabored      Heart:    Regular rate and rhythm, S1 and S2 normal, no murmur, rub   or gallop     Abdomen:     Soft, bowel sounds active all four quadrants, mild diffuse tenderness, no guarding        Extremities:   Extremities normal, atraumatic, no cyanosis or edema     Skin:   Skin color, texture, turgor normal, no rashes or lesions  Lymph nodes:   Cervical, supraclavicular, and  axillary nodes normal        Results for orders placed or performed during the hospital encounter of 10/01/15 (from the past 48 hour(s))  Glucose, capillary     Status: Abnormal   Collection Time: 10/01/15  4:38 PM  Result Value Ref Range   Glucose-Capillary 143 (H) 65 - 99 mg/dL  Surgical pcr screen     Status: None   Collection Time: 10/01/15  4:56 PM  Result Value Ref Range   MRSA, PCR NEGATIVE NEGATIVE   Staphylococcus aureus NEGATIVE NEGATIVE    Comment:        The Xpert SA Assay (FDA approved for NASAL specimens in patients over 37 years of age), is one component of a comprehensive surveillance program.  Test performance has been validated by [facility] for patients greater than or equal to 74 year old. It is not intended to diagnose infection nor to guide or monitor treatment.   CBC with Differential/Platelet     Status: Abnormal   Collection Time: 10/01/15  5:11 PM  Result Value Ref Range   WBC 13.0 (H) 4.0 - 10.5 K/uL   RBC 3.62 (L) 3.87 - 5.11 MIL/uL   Hemoglobin 9.6 (L) 12.0 - 15.0 g/dL   HCT 29.3 (L) 36.0 - 46.0 %   MCV 80.9 78.0 - 100.0 fL   MCH 26.5 26.0 - 34.0 pg   MCHC 32.8 30.0 - 36.0 g/dL   RDW 16.7 (H) 11.5 - 15.5 %   Platelets 534 (H) 150 - 400 K/uL   Neutrophils Relative % 75 %   Neutro Abs 9.6 (H) 1.7 - 7.7 K/uL   Lymphocytes Relative 17 %   Lymphs Abs 2.2 0.7 - 4.0 K/uL   Monocytes Relative 8 %   Monocytes Absolute 1.0 0.1 - 1.0 K/uL   Eosinophils Relative 0  %   Eosinophils Absolute 0.0 0.0 - 0.7 K/uL   Basophils Relative 0 %   Basophils Absolute 0.0 0.0 - 0.1 K/uL  Basic metabolic panel     Status: Abnormal   Collection Time: 10/01/15  5:11 PM  Result Value Ref Range   Sodium 133 (L) 135 - 145 mmol/L   Potassium 6.3 (HH) 3.5 - 5.1 mmol/L    Comment: SPECIMEN HEMOLYZED. HEMOLYSIS MAY AFFECT INTEGRITY OF RESULTS. B BIBBS,RN 1756 10/01/15 D BRADLEY    Chloride 97 (L) 101 - 111 mmol/L   CO2 25 22 - 32 mmol/L   Glucose, Bld 142 (H) 65 - 99 mg/dL   BUN 11 6 - 20 mg/dL   Creatinine, Ser 1.07 (H) 0.44 - 1.00 mg/dL   Calcium 9.4 8.9 - 10.3 mg/dL   GFR calc non Af Amer 50 (L) >60 mL/min   GFR calc Af Amer 58 (L) >60 mL/min    Comment: (NOTE) The eGFR has been calculated using the CKD EPI equation. This calculation has not been validated in all clinical situations. eGFR's persistently <60 mL/min signify possible Chronic Kidney Disease.    Anion gap 11 5 - 15  Protime-INR     Status: None   Collection Time: 10/01/15  5:11 PM  Result Value Ref Range   Prothrombin Time 15.1 11.4 - 15.2 seconds   INR 1.18   APTT     Status: None   Collection Time: 10/01/15  5:11 PM  Result Value Ref Range   aPTT 31 24 - 36 seconds  Aerobic/Anaerobic Culture (surgical/deep wound)     Status: None (Preliminary result)   Collection Time: 10/01/15  5:44 PM  Result Value Ref Range   Specimen Description WOUND    Special Requests RIGHT LUMBAR SPEC A    Gram Stain NO WBC SEEN NO ORGANISMS SEEN     Culture NO GROWTH < 24 HOURS    Report Status PENDING   Aerobic/Anaerobic Culture (surgical/deep wound)     Status: None (Preliminary result)   Collection Time: 10/01/15  5:51 PM  Result Value Ref Range   Specimen Description WOUND    Special Requests LEFT LUMBAR SPEC B    Gram Stain NO WBC SEEN NO ORGANISMS SEEN     Culture NO GROWTH < 24 HOURS    Report Status PENDING   Glucose, capillary     Status: Abnormal   Collection Time: 10/01/15  6:31 PM  Result  Value Ref Range   Glucose-Capillary 133 (H) 65 - 99 mg/dL   Comment 1 Notify RN   Comprehensive metabolic panel     Status: Abnormal   Collection Time: 10/01/15  8:44 PM  Result Value Ref Range   Sodium 135 135 - 145 mmol/L   Potassium 3.9 3.5 - 5.1 mmol/L    Comment: DELTA CHECK NOTED   Chloride 99 (L) 101 - 111 mmol/L   CO2 27 22 - 32 mmol/L   Glucose, Bld 164 (H) 65 - 99 mg/dL   BUN 11 6 - 20 mg/dL   Creatinine, Ser 1.00 0.44 - 1.00 mg/dL   Calcium 9.0 8.9 - 10.3 mg/dL   Total Protein 7.4 6.5 - 8.1 g/dL   Albumin 2.8 (L) 3.5 - 5.0 g/dL   AST 18 15 - 41 U/L   ALT 12 (L) 14 - 54 U/L   Alkaline Phosphatase 77 38 - 126 U/L   Total Bilirubin 0.5 0.3 - 1.2 mg/dL   GFR calc non Af Amer 55 (L) >60 mL/min   GFR calc Af Amer >60 >60 mL/min    Comment: (NOTE) The eGFR has been calculated using the CKD EPI equation. This calculation has not been validated in all clinical situations. eGFR's persistently <60 mL/min signify possible Chronic Kidney Disease.    Anion gap 9 5 - 15  Magnesium     Status: Abnormal   Collection Time: 10/01/15  8:44 PM  Result Value Ref Range   Magnesium 1.3 (L) 1.7 - 2.4 mg/dL  Phosphorus     Status: Abnormal   Collection Time: 10/01/15  8:44 PM  Result Value Ref Range   Phosphorus 4.7 (H) 2.5 - 4.6 mg/dL  Glucose, capillary     Status: Abnormal   Collection Time: 10/01/15 10:08 PM  Result Value Ref Range   Glucose-Capillary 143 (H) 65 - 99 mg/dL   Comment 1 Notify RN    Comment 2 Document in Chart   Glucose, capillary     Status: Abnormal   Collection Time: 10/02/15  4:07 AM  Result Value Ref Range   Glucose-Capillary 137 (H) 65 - 99 mg/dL   Comment 1 Notify RN    Comment 2 Document in Chart   Glucose, capillary     Status: Abnormal   Collection Time: 10/02/15  7:46 AM  Result Value Ref Range   Glucose-Capillary 100 (H) 65 - 99 mg/dL  Glucose, capillary     Status: Abnormal   Collection Time: 10/02/15 12:10 PM  Result Value Ref Range    Glucose-Capillary 150 (H) 65 - 99 mg/dL  Glucose, capillary     Status: Abnormal   Collection Time: 10/02/15  4:54 PM  Result Value Ref Range   Glucose-Capillary 206 (H) 65 - 99 mg/dL      Component Value Date/Time   SDES WOUND 10/01/2015 1751   SPECREQUEST LEFT LUMBAR SPEC B 10/01/2015 1751   CULT NO GROWTH < 24 HOURS 10/01/2015 1751   REPTSTATUS PENDING 10/01/2015 1751   No results found. Recent Results (from the past 240 hour(s))  Surgical pcr screen     Status: None   Collection Time: 10/01/15  4:56 PM  Result Value Ref Range Status   MRSA, PCR NEGATIVE NEGATIVE Final   Staphylococcus aureus NEGATIVE NEGATIVE Final    Comment:        The Xpert SA Assay (FDA approved for NASAL specimens in patients over 11 years of age), is one component of a comprehensive surveillance program.  Test performance has been validated by [facility] for patients greater than or equal to 89 year old. It is not intended to diagnose infection nor to guide or monitor treatment.   Aerobic/Anaerobic Culture (surgical/deep wound)     Status: None (Preliminary result)   Collection Time: 10/01/15  5:44 PM  Result Value Ref Range Status   Specimen Description WOUND  Final   Special Requests RIGHT LUMBAR SPEC A  Final   Gram Stain NO WBC SEEN NO ORGANISMS SEEN   Final   Culture NO GROWTH < 24 HOURS  Final   Report Status PENDING  Incomplete  Aerobic/Anaerobic Culture (surgical/deep wound)     Status: None (Preliminary result)   Collection Time: 10/01/15  5:51 PM  Result Value Ref Range Status   Specimen Description WOUND  Final   Special Requests LEFT LUMBAR SPEC B  Final   Gram Stain NO WBC SEEN NO ORGANISMS SEEN   Final   Culture NO GROWTH < 24 HOURS  Final   Report Status PENDING  Incomplete      10/02/2015, 5:10 PM     LOS: 1 day    Records and images were personally reviewed where available.  Pt seen, case reviewed with Dr Hetty Ely.

## 2015-10-02 NOTE — Evaluation (Signed)
Physical Therapy Evaluation Patient Details Name: Nancy [name] MRN: [id number] DOB: [birth year] Today's Date: 10/02/2015   History of Present Illness  Patient is a 75 y/o female admitted with surgical wound infection.  hx of DM, HTN, HLD, diastolic dysfunction, s/p L4-5 TLIF, posterior fusion L5-S1 on 9/6.  Clinical Impression  Pt admitted with above diagnosis. Pt currently with functional limitations due to the deficits listed below (see PT Problem List). Pt will benefit from skilled PT to increase their independence and safety with mobility to allow discharge to the venue listed below.  Pt moving slowly, but appears more motivated than during last admission.  Recommend SNF at this time to maximize her independence before returning home.     Follow Up Recommendations SNF    Equipment Recommendations  None recommended by PT    Recommendations for Other Services       Precautions / Restrictions Precautions Precautions: Back;Fall Precaution Booklet Issued: No Precaution Comments: pt able to recall 2/3 back precautions, pt with wound vac Required Braces or Orthoses: Spinal Brace Spinal Brace: Lumbar corset;Applied in sitting position Restrictions Weight Bearing Restrictions: No      Mobility  Bed Mobility Overal bed mobility: Independent Bed Mobility: Rolling;Sidelying to Sit Rolling: Supervision Sidelying to sit: Min assist;HOB elevated       General bed mobility comments: pt using log roll technique, min assist to elevate trunk, increased time  Transfers Overall transfer level: Needs assistance Equipment used: Rolling walker (2 wheeled) Transfers: Sit to/from Stand Sit to Stand: Min guard;Min assist         General transfer comment: min from bed to steady, min guard from 3 in 1, cues for hand placement.  slow transition.  Ambulation/Gait Ambulation/Gait assistance: Min assist Ambulation Distance (Feet): 24 Feet Assistive device: Rolling walker (2  wheeled) Gait Pattern/deviations: Decreased step length - right;Decreased step length - left;Shuffle;Wide base of support Gait velocity: decreased   General Gait Details: Slow cadence with decreased step length and occasional shuffle, but when offered seat midway through gait, she declined and ambulated back.  Stairs            Wheelchair Mobility    Modified Rankin (Stroke Patients Only)       Balance     Sitting balance-Leahy Scale: Fair       Standing balance-Leahy Scale: Poor Standing balance comment: requires UE support                             Pertinent Vitals/Pain Pain Assessment: Faces Faces Pain Scale: Hurts even more Pain Location: back Pain Descriptors / Indicators: Grimacing;Guarding;Moaning Pain Intervention(s): Monitored during session;Premedicated before session;Repositioned    Home Living Family/patient expects to be discharged to:: Private residence Living Arrangements: Children Available Help at Discharge: Family;Available 24 hours/day Type of Home: House Home Access: Stairs to enter   [employer] of Steps: 1 Home Layout: Two level;Able to live on main level with bedroom/bathroom Home Equipment: Shower seat - built in;Walker - 2 wheels;Cane - single point;Bedside commode;Hospital bed      Prior Function Level of Independence: Needs assistance   Gait / Transfers Assistance Needed: ambulating with a walker short distances with HHPT  ADL's / Homemaking Assistance Needed: Daughter was assisting with ADL and performing all IADL. Pt used BSC.        Hand Dominance   Dominant Hand: Right    Extremity/Trunk Assessment   Upper Extremity Assessment: Defer to OT evaluation  Lower Extremity Assessment: Overall WFL for tasks assessed;Generalized weakness      Cervical / Trunk Assessment: Other exceptions  Communication   Communication: Expressive difficulties (low volume, slightly difficult to  understand/tongue edema)  Cognition Arousal/Alertness: Awake/alert Behavior During Therapy: Flat affect Overall Cognitive Status: Within Functional Limits for tasks assessed                 General Comments: somewhat slow processing    General Comments      Exercises     Assessment/Plan    PT Assessment Patient needs continued PT services  PT Problem List Decreased strength;Decreased activity tolerance;Decreased balance;Decreased knowledge of use of DME;Decreased mobility          PT Treatment Interventions DME instruction;Gait training;Functional mobility training;Balance training;Therapeutic exercise;Therapeutic activities    PT Goals (Current goals can be found in the Care Plan section)  Acute Rehab PT Goals Patient Stated Goal: pt agreeable to further therapy in SNF PT Goal Formulation: With patient Time For Goal Achievement: 10/09/15 Potential to Achieve Goals: Good    Frequency Min 5X/week   Barriers to discharge        Co-evaluation PT/OT/SLP Co-Evaluation/Treatment: Yes Reason for Co-Treatment: For patient/therapist safety PT goals addressed during session: Mobility/safety with mobility;Proper use of DME OT goals addressed during session: ADL's and self-care       End of Session Equipment Utilized During Treatment: Gait belt;Back brace Activity Tolerance: Patient tolerated treatment well Patient left: in chair;with call bell/phone within reach Nurse Communication: Mobility status         Time: [id number] PT Time Calculation (min) (ACUTE ONLY): 38 min   Charges:   PT Evaluation $PT Eval Moderate Complexity: 1 Procedure PT Treatments $Gait Training: 8-22 mins   PT G Codes:        Sharonica Kraszewski LUBECK 10/02/2015, 10:37 AM

## 2015-10-02 NOTE — NC FL2 (Signed)
[place] LEVEL OF CARE SCREENING TOOL     IDENTIFICATION  Patient Name: Nancy [name] Birthdate: [birth year] Sex: female Admission Date (Current Location): 10/01/2015  [facility] and [place] Number:  Herbalist and Address:  [facility], [place] [address] [place], [place] [address]      Provider Number: [id number]  Attending Physician Name and Address:  Melina Schools, MD  Relative Name and Phone Number:       Current Level of Care: Hospital Recommended Level of Care: [place] Prior Approval Number:    Date Approved/Denied:   PASRR Number:    Discharge Plan: SNF    Current Diagnoses: Patient Active Problem List   Diagnosis Date Noted  . Angioedema   . Gout 10/01/2015  . Hyperlipidemia 10/01/2015  . Type 2 diabetes mellitus ([place]) 10/01/2015  . Wound infection ([place]) 10/01/2015  . Back pain 09/17/2015  . Gait instability 02/19/2013  . Pulmonary hyperinflation 01/17/2013  . Diastolic dysfunction   . Essential hypertension, benign 01/03/2013  . Infected sebaceous cyst 12/23/2010    Orientation RESPIRATION BLADDER Height & Weight     Self, Time, Situation, Place  Normal Incontinent Weight: 191 lb 5.8 oz (86.8 kg) Height:  5' 1.5" (156.2 cm)  BEHAVIORAL SYMPTOMS/MOOD NEUROLOGICAL BOWEL NUTRITION STATUS      Incontinent Diet (Normal)  AMBULATORY STATUS COMMUNICATION OF NEEDS Skin   Limited Assist Verbally  (STAGE 2 ULCER ON buttocks. Patieint has a wouund vac)                       Personal Care Assistance Level of Assistance  Bathing, Dressing, Feeding Bathing Assistance: Limited assistance   Dressing Assistance: Limited assistance     Functional Limitations Info    Sight Info: Adequate Hearing Info: Adequate Speech Info: Adequate    SPECIAL CARE FACTORS FREQUENCY                       Contractures      Additional Factors Info    Code Status Info:  (Full)              Current Medications (10/02/2015):  This is the current hospital active medication list Current Facility-Administered Medications  Medication Dose Route Frequency Provider Last Rate Last Dose  . 0.9 %  sodium chloride infusion  250 mL Intravenous Continuous Melina Schools, MD      . 0.9 %  sodium chloride infusion   Intravenous Continuous Gardiner Barefoot, NP 75 mL/hr at 10/02/15 0107    . allopurinol (ZYLOPRIM) tablet 200 mg  200 mg Oral Daily Melina Schools, MD   200 mg at 10/02/15 1149  . amLODipine (NORVASC) tablet 10 mg  10 mg Oral q morning - 10a Melina Schools, MD   10 mg at 10/02/15 1150  . colchicine tablet 0.6 mg  0.6 mg Oral Daily PRN Melina Schools, MD      . diphenhydrAMINE (BENADRYL) injection 25 mg  25 mg Intravenous Q6H PRN Reubin Milan, MD      . furosemide (LASIX) tablet 20 mg  20 mg Oral Daily Melina Schools, MD   20 mg at 10/02/15 1149  . glipiZIDE (GLUCOTROL) tablet 5 mg  5 mg Oral QAC breakfast Melina Schools, MD      . HYDROmorphone (DILAUDID) injection 0.5 mg  0.5 mg Intravenous Q3H PRN Gardiner Barefoot, NP   0.5 mg at 10/02/15 0809  .  insulin aspart (novoLOG) injection 0-15 Units  0-15 Units Subcutaneous Q4H Gardiner Barefoot, NP   2 Units at 10/02/15 0423  . lubiprostone (AMITIZA) capsule 24 mcg  24 mcg Oral BID WC Melina Schools, MD      . magnesium citrate solution 0.5 Bottle  0.5 Bottle Oral Once Melina Schools, MD      . methocarbamol (ROBAXIN) tablet 500 mg  500 mg Oral Q6H PRN Melina Schools, MD   500 mg at 10/01/15 2129   Or  . methocarbamol (ROBAXIN) 500 mg in dextrose 5 % 50 mL IVPB  500 mg Intravenous Q6H PRN Melina Schools, MD   500 mg at 10/02/15 0811  . metoprolol (LOPRESSOR) tablet 100 mg  100 mg Oral q morning - 10a Melina Schools, MD   100 mg at 10/02/15 1150  . ondansetron (ZOFRAN) injection 4 mg  4 mg Intravenous Q4H PRN Melina Schools, MD      . oxyCODONE (Oxy IR/ROXICODONE) immediate release tablet 10 mg  10 mg Oral Q4H PRN Melina Schools, MD    10 mg at 10/02/15 1150  . pantoprazole (PROTONIX) EC tablet 40 mg  40 mg Oral Daily Melina Schools, MD   40 mg at 10/02/15 1149  . phenol (CHLORASEPTIC) mouth spray 1 spray  1 spray Mouth/Throat PRN Melina Schools, MD      . piperacillin-tazobactam (ZOSYN) IVPB 3.375 g  3.375 g Intravenous Q8H Priscella Mann, RPH   3.375 g at 10/02/15 1246  . sodium chloride flush (NS) 0.9 % injection 3 mL  3 mL Intravenous Q12H Melina Schools, MD      . sodium chloride flush (NS) 0.9 % injection 3 mL  3 mL Intravenous PRN Melina Schools, MD      . sodium phosphate (FLEET) 7-19 GM/118ML enema 1 enema  1 enema Rectal Daily PRN Melina Schools, MD      . vancomycin (VANCOCIN) IVPB 750 mg/150 ml premix  750 mg Intravenous Q12H Priscella Mann, RPH   750 mg at 10/02/15 1105     Discharge Medications: Please see discharge summary for a list of discharge medications.  Relevant Imaging Results:  Relevant Lab Results:   Additional Information    [name], [place] R

## 2015-10-02 NOTE — Progress Notes (Signed)
Shift event: Pt underwent a lumbar wound debridement tonight by ortho secondary to an infected wound 2 weeks postop TLIF. Ortho asked [facility] to consult for medical issues. Dr. Olevia Bowens from [place] saw pt earlier in shift.  Later, RN paged because pt's tongue was swollen. Rapid response to bedside. No respiratory distress but bedside visit warranted. NP's colleague had ordered 25mg  IV Benadryl and RN had given this.  This NP spoke to RN, Gerrit Friends, and went to the bedside. S: Per pt, she has never had anything like this happen to her before. She denies SOB. Difficult to talk with tongue size.  Per RN, pt was given Toradol then Morphine around 2300 hrs. About an hour later, RN returned to check on pt and her tongue was swollen. No stridor, hypoxia, change in vital signs, or respiratory distress noted.  O: Well appearing AAF in NAD. Alert and oriented. Tongue is large and pt is unable to close lips. + drooling. No rash or hives noted. No swelling of lips. No cervical lymphadenopathy noted. No asymmetrical swelling of face noted. No stridor. Lungs clear with good air exchange. RRR.  A/P: 1. Angioedema-never had before. Benadryl and Epi pen administered. Pt just received Toradol and Morphine before event. Pt is on an ACE I which is usually the culprit. Hold ACE and follow BP. Change morphine to Dilaudid. No further toradol. No respiratory distress noted and VSS. OK to leave on floor for now. Continuous pulse Ox for tonight. Q2, then q4 vitals. NPO until swelling down. HOB up as much as OK with ortho. Low threshold for transfer should respiratory compromise occur. Discussed plan with pt and RN. Dr. Olevia Bowens aware of event. 2. DM II-q4 CBG and SSI while NPO. Hold Metformin. 3. HTN-follow off ACE. ? Replace with ARB.  Will follow.  KJKG, NP [facility]

## 2015-10-03 DIAGNOSIS — Z978 Presence of other specified devices: Secondary | ICD-10-CM

## 2015-10-03 DIAGNOSIS — F05 Delirium due to known physiological condition: Secondary | ICD-10-CM

## 2015-10-03 DIAGNOSIS — S31000A Unspecified open wound of lower back and pelvis without penetration into retroperitoneum, initial encounter: Secondary | ICD-10-CM

## 2015-10-03 LAB — CBC
HEMATOCRIT: 24.8 % — AB (ref 36.0–46.0)
Hemoglobin: 7.7 g/dL — ABNORMAL LOW (ref 12.0–15.0)
MCH: 24.8 pg — AB (ref 26.0–34.0)
MCHC: 31 g/dL (ref 30.0–36.0)
MCV: 80 fL (ref 78.0–100.0)
PLATELETS: 417 10*3/uL — AB (ref 150–400)
RBC: 3.1 MIL/uL — AB (ref 3.87–5.11)
RDW: 16.5 % — ABNORMAL HIGH (ref 11.5–15.5)
WBC: 7.4 10*3/uL (ref 4.0–10.5)

## 2015-10-03 LAB — BASIC METABOLIC PANEL
Anion gap: 11 (ref 5–15)
BUN: 11 mg/dL (ref 6–20)
CHLORIDE: 97 mmol/L — AB (ref 101–111)
CO2: 25 mmol/L (ref 22–32)
CREATININE: 1.07 mg/dL — AB (ref 0.44–1.00)
Calcium: 9.4 mg/dL (ref 8.9–10.3)
GFR calc Af Amer: 58 mL/min — ABNORMAL LOW (ref >60)
GFR, EST NON AFRICAN AMERICAN: 50 mL/min — AB (ref >60)
GLUCOSE: 142 mg/dL — AB (ref 65–99)
POTASSIUM: 6.3 mmol/L — AB (ref 3.5–5.1)
SODIUM: 133 mmol/L — AB (ref 135–145)

## 2015-10-03 LAB — GLUCOSE, CAPILLARY
GLUCOSE-CAPILLARY: 129 mg/dL — AB (ref 65–99)
Glucose-Capillary: 232 mg/dL — ABNORMAL HIGH (ref 65–99)
Glucose-Capillary: 92 mg/dL (ref 65–99)
Glucose-Capillary: 160 mg/dL — ABNORMAL HIGH (ref 65–99)

## 2015-10-03 LAB — HEMOGLOBIN A1C
Hgb A1c MFr Bld: 7.3 % — ABNORMAL HIGH (ref 4.8–5.6)
MEAN PLASMA GLUCOSE: 163 mg/dL

## 2015-10-03 LAB — SEDIMENTATION RATE: Sed Rate: 135 mm/hr — ABNORMAL HIGH (ref 0–22)

## 2015-10-03 MED ORDER — WHITE PETROLATUM GEL
Status: AC
  Administered 2015-10-04: 06:00:00
  Filled 2015-10-03: qty 1

## 2015-10-03 MED ORDER — HYDROMORPHONE HCL 1 MG/ML IJ SOLN
2.0000 mg | INTRAMUSCULAR | Status: DC | PRN
  Administered 2015-10-03: 2 mg via INTRAVENOUS

## 2015-10-03 MED ORDER — HYDROMORPHONE HCL 1 MG/ML IJ SOLN
INTRAMUSCULAR | Status: AC
  Filled 2015-10-03: qty 1

## 2015-10-03 MED ORDER — FERROUS SULFATE 325 (65 FE) MG PO TABS
325.0000 mg | ORAL_TABLET | Freq: Three times a day (TID) | ORAL | Status: DC
Start: 2015-10-03 — End: 2015-10-08
  Administered 2015-10-03 – 2015-10-08 (×15): 325 mg via ORAL
  Filled 2015-10-03 (×14): qty 1

## 2015-10-03 MED ORDER — OXYCODONE HCL 5 MG PO TABS
5.0000 mg | ORAL_TABLET | ORAL | Status: DC | PRN
  Administered 2015-10-03 – 2015-10-04 (×4): 10 mg via ORAL
  Filled 2015-10-03 (×4): qty 2

## 2015-10-03 MED ORDER — HYDROMORPHONE HCL 1 MG/ML IJ SOLN
0.5000 mg | INTRAMUSCULAR | Status: DC | PRN
Start: 2015-10-03 — End: 2015-10-06
  Administered 2015-10-03 – 2015-10-06 (×10): 0.5 mg via INTRAVENOUS
  Filled 2015-10-03 (×10): qty 1

## 2015-10-03 MED ORDER — OXYCODONE HCL 5 MG PO TABS
5.0000 mg | ORAL_TABLET | ORAL | Status: DC | PRN
  Administered 2015-10-03 (×2): 5 mg via ORAL
  Filled 2015-10-03 (×2): qty 1

## 2015-10-03 MED ORDER — HYDROMORPHONE HCL 1 MG/ML IJ SOLN: 2.0000 mg | Freq: Once | INTRAMUSCULAR | Status: DC

## 2015-10-03 NOTE — Progress Notes (Signed)
Physical Therapy Treatment Patient Details Name: Nancy [name] MRN: [id number] DOB: 1941/01/03 Today's Date: 10/03/2015    History of Present Illness Patient is a 75 y/o female admitted with surgical wound infection.  hx of DM, HTN, HLD, diastolic dysfunction, s/p L4-5 TLIF, posterior fusion L5-S1 on 9/6.    PT Comments    Attempting to mobilize pt but activity limited by the patient's high level of confusion. All mobility was very slow and increased assistance needed. Unable to safely attempt ambulation due to pt refusal as well as safety concerns. PT to continue to follow and attempt to progress as tolerated. Recommendation for SNF remains appropriate.   Follow Up Recommendations  SNF     Equipment Recommendations  None recommended by PT    Recommendations for Other Services       Precautions / Restrictions Precautions Precautions: Back;Fall Required Braces or Orthoses: Spinal Brace Spinal Brace: Lumbar corset;Applied in sitting position Restrictions Weight Bearing Restrictions: No    Mobility  Bed Mobility Overal bed mobility: Needs Assistance Bed Mobility: Rolling;Sidelying to Sit Rolling: Mod assist Sidelying to sit: Mod assist       General bed mobility comments: Heavy cues needed along with physical assist for pt to come to sitting.    Transfers                 General transfer comment: unable to attempt, pt unwilling to attempt standing despite initial encouragement. Pt sitting EOB approximately 76minutes.   Ambulation/Gait                 Stairs            Wheelchair Mobility    Modified Rankin (Stroke Patients Only)       Balance Overall balance assessment: Needs assistance Sitting-balance support: No upper extremity supported Sitting balance-Leahy Scale: Fair                              Cognition Arousal/Alertness: Awake/alert Behavior During Therapy: Flat affect Overall Cognitive Status:  Impaired/Different from baseline Area of Impairment: Orientation;Awareness Orientation Level: Situation     Following Commands: Follows one step commands inconsistently   Awareness: Intellectual   General Comments: Pt recognizing that she is confused but unable to recall what's happening after explained to pt.     Exercises      General Comments        Pertinent Vitals/Pain Pain Assessment: Faces Faces Pain Scale: Hurts little more Pain Location: back Pain Descriptors / Indicators: Grimacing;Moaning Pain Intervention(s): Limited activity within patient's tolerance;Monitored during session;Repositioned    Home Living                      Prior Function            PT Goals (current goals can now be found in the care plan section) Acute Rehab PT Goals Patient Stated Goal: find out what going on  PT Goal Formulation: With patient Time For Goal Achievement: 10/09/15 Potential to Achieve Goals: Good Progress towards PT goals: Progressing toward goals (overall improving, current session limited by confusion)    Frequency    Min 5X/week      PT Plan Current plan remains appropriate    Co-evaluation             End of Session Equipment Utilized During Treatment: Back brace (brace applied, pt unwilling to attempt standing. ) Activity Tolerance: Other (comment) (  mobility limited by confusion) Patient left: in bed;with call bell/phone within reach;with bed alarm set (on Rt side with pillow between knees. )     Time: [id number] PT Time Calculation (min) (ACUTE ONLY): 30 min  Charges:  $Therapeutic Activity: 23-37 mins                    G Codes:      Cassell Clement, PT, CSCS Pager [phone] Office [phone]  10/03/2015, 2:54 PM

## 2015-10-03 NOTE — Progress Notes (Signed)
CONSULT PROGRESS NOTE [facility]   TASHUNA HODUM   [id number] DOB: [birth year]  DOA: 10/01/2015 PCP: Antony Blackbird, MD   Brief Narrative:   75 year old female with a past medical history of GERD, PUD, diastolic CHF, pulmonary hypertension, essential hypertension, gout, hyperlipidemia, type 2 diabetes, osteoarthritis, osteopenia who is 2 weeks s/p TLIF. aAdmitted with healing complications with drainage and dehiscence of wound. She just underwent a formal I&D and application of wound VAC by Dr. Rolena Infante and we are being consulted for medical management. Patient on IV abx, ID consulted.    Subjective: Patient seen and examined today with daughter at bedside. Patient continues to c/o back pain from the procedure, but better controlled with medications. Overnight had an episode of disorientation and confusion. She denies fever, chest pain, dyspnea, palpitations, pitting edema lower extremities, abdominal pain, nausea, emesis, diarrhea or constipation    Assessment & Plan:  Delirium - This could be pain related vs opioid induced. Is important to keep pain well control, will help with recovery, also there is a fine line between over treating pain which can cause this side effects.  - d/c dilaudid, decrease oxycodone to 5 mg, consider to add Ultram or Ketorolac  - Continue to monitor    Wound infection ([place]) - Cultures no growth UTD Continue treatment and postop care per Dr. Rolena Infante. ID recommendations appreciated  Zosyn switch to Ceftriaxone  Continue Vanco Follow-up wound culture and sensitivity  Essential hypertension, benign - BP stable  Lisinopril was held due to angioedema - last night which has resolved.  Continue amlodipine 10 mg by mouth daily. Continue metoprolol 100 mg by mouth daily. Continue furosemide 20 mg by mouth daily. Monitor blood pressure periodically.  Diastolic dysfunction Continue furosemide and beta blocker. Lisinopril held due to  angioedema.  Back pain - see #1  Medications adjusted   Gout - Stable  Continue colchicine 0.6 milligram by mouth daily. Continue allopurinol 200 mg by mouth daily.  Hyperlipidemia - stable  Follow-up with PCP.  Type 2 diabetes mellitus (HCC) - stable Carbohydrate modified diet. Continue glipizide 5 mg by mouth daily before breakfast  Angioedema - Resolved  Patient eating and breathing room air. Likely 2/2 ACEi vs NSAIDs allergic rx.  ACEi hold - ? If ARBS needed  Can d/c telemetry and continues pulse ox  DVT prophylaxis: SCV Code Status: Full  Family Communication: Daughter at bedside, all questions answered Disposition Plan:  Per Ortho   Objective: Vitals:   10/02/15 1829 10/02/15 2054 10/03/15 0100 10/03/15 0658  BP: 108/62 (!) 112/46 (!) 119/49 (!) 162/69  Pulse: 76 86 95 100  Resp: 16 18 17  (!) 21  Temp: 98.6 F (37 C) 98.6 F (37 C) 99.5 F (37.5 C) 99 F (37.2 C)  TempSrc: Oral Oral    SpO2: 96% 96% 95% 98%  Weight:      Height:        Intake/Output Summary (Last 24 hours) at 10/03/15 0913 Last data filed at 10/03/15 0741  Gross per 24 hour  Intake             3185 ml  Output              700 ml  Net             2485 ml   Filed Weights   10/01/15 1629  Weight: 86.8 kg (191 lb 5.8 oz)    Examination:   General:  Afebrile, Mild distress due to pain  ENT: OP clear and patent   Neck: Supple, no JVD.   Cardiovascular: S1-S2 RRR.   Respiratory: CTA bilaterally.   Abdomen: BS positive, soft, nontender.   Skin: Dressing and wound VAC in place.   Musculoskeletal: No clubbing or cyanosis.   Neurologic: Awake, alert, oriented 3, moves all extremities, but unable to fully evaluate due to back pain   Data Reviewed: I have personally reviewed following labs and imaging studies  CBC:  Recent Labs Lab 10/01/15 1711 10/03/15 0816  WBC 13.0* 7.4  NEUTROABS 9.6*  --   HGB 9.6* 7.7*  HCT 29.3* 24.8*  MCV 80.9 80.0  PLT 534* 417*    Basic Metabolic Panel:  Recent Labs Lab 10/01/15 1711 10/01/15 2044  NA 133* 135  K 6.3* 3.9  CL 97* 99*  CO2 25 27  GLUCOSE 142* 164*  BUN 11 11  CREATININE 1.07* 1.00  CALCIUM 9.4 9.0  MG  --  1.3*  PHOS  --  4.7*   GFR: Estimated Creatinine Clearance: 50.7 mL/min (by C-G formula based on SCr of 1 mg/dL). Liver Function Tests:  Recent Labs Lab 10/01/15 2044  AST 18  ALT 12*  ALKPHOS 77  BILITOT 0.5  PROT 7.4  ALBUMIN 2.8*   No results for input(s): LIPASE, AMYLASE in the last 168 hours. No results for input(s): AMMONIA in the last 168 hours. Coagulation Profile:  Recent Labs Lab 10/01/15 1711  INR 1.18   Cardiac Enzymes: No results for input(s): CKTOTAL, CKMB, CKMBINDEX, TROPONINI in the last 168 hours. BNP (last 3 results) No results for input(s): PROBNP in the last 8760 hours. HbA1C:  Recent Labs  10/01/15 2044  HGBA1C 7.3*   CBG:  Recent Labs Lab 10/02/15 0746 10/02/15 1210 10/02/15 1654 10/02/15 2159 10/03/15 0744  GLUCAP 100* 150* 206* 187* 129*   Lipid Profile: No results for input(s): CHOL, HDL, LDLCALC, TRIG, CHOLHDL, LDLDIRECT in the last 72 hours. Thyroid Function Tests: No results for input(s): TSH, T4TOTAL, FREET4, T3FREE, THYROIDAB in the last 72 hours. Anemia Panel: No results for input(s): VITAMINB12, FOLATE, FERRITIN, TIBC, IRON, RETICCTPCT in the last 72 hours. Sepsis Labs: No results for input(s): PROCALCITON, LATICACIDVEN in the last 168 hours.  Recent Results (from the past 240 hour(s))  Surgical pcr screen     Status: None   Collection Time: 10/01/15  4:56 PM  Result Value Ref Range Status   MRSA, PCR NEGATIVE NEGATIVE Final   Staphylococcus aureus NEGATIVE NEGATIVE Final    Comment:        The Xpert SA Assay (FDA approved for NASAL specimens in patients over 62 years of age), is one component of a comprehensive surveillance program.  Test performance has been validated by [facility] for patients  greater than or equal to 73 year old. It is not intended to diagnose infection nor to guide or monitor treatment.   Aerobic/Anaerobic Culture (surgical/deep wound)     Status: None (Preliminary result)   Collection Time: 10/01/15  5:44 PM  Result Value Ref Range Status   Specimen Description WOUND  Final   Special Requests RIGHT LUMBAR SPEC A  Final   Gram Stain NO WBC SEEN NO ORGANISMS SEEN   Final   Culture NO GROWTH < 24 HOURS  Final   Report Status PENDING  Incomplete  Aerobic/Anaerobic Culture (surgical/deep wound)     Status: None (Preliminary result)   Collection Time: 10/01/15  5:51 PM  Result Value Ref Range Status   Specimen Description WOUND  Final   Special Requests LEFT LUMBAR SPEC B  Final   Gram Stain NO WBC SEEN NO ORGANISMS SEEN   Final   Culture NO GROWTH < 24 HOURS  Final   Report Status PENDING  Incomplete      Radiology Studies: No results found.    Scheduled Meds: . allopurinol  200 mg Oral Daily  . amLODipine  10 mg Oral q morning - 10a  . cefTRIAXone (ROCEPHIN)  IV  2 g Intravenous Q24H  . furosemide  20 mg Oral Daily  . glipiZIDE  5 mg Oral QAC breakfast  . insulin aspart  0-15 Units Subcutaneous TID WC  . insulin aspart  0-5 Units Subcutaneous QHS  . lubiprostone  24 mcg Oral BID WC  . magnesium citrate  0.5 Bottle Oral Once  . metoprolol  100 mg Oral q morning - 10a  . pantoprazole  40 mg Oral Daily  . sodium chloride flush  3 mL Intravenous Q12H  . vancomycin  750 mg Intravenous Q12H   Continuous Infusions: . sodium chloride    . sodium chloride Stopped (10/03/15 0700)     LOS: 2 days   Chipper Oman, MD [facility] Pager [phone]  If 7PM-7AM, please contact night-coverage [email or web address] Password [facility] 10/03/2015, 9:13 AM

## 2015-10-03 NOTE — Care Management Note (Signed)
Case Management Note  Patient Details  Name: Nancy [name] MRN: [id number] Date of Birth: 1940/12/21  Subjective/Objective:                    Action/Plan: Spoke with patient and daughter at bedside . They are in agreement with SNF at discharge. [place].  SW [place] aware patient may need VAC and IV ABX  Expected Discharge Date:                  Expected Discharge Plan:  [place]  In-House Referral:     Discharge planning Services  CM Consult  Post Acute Care Choice:    Choice offered to:  Patient, Adult Children  DME Arranged:    DME Agency:     HH Arranged:    [place] Agency:     Status of Service:  Completed, signed off  If discussed at [employer] of Stay Meetings, dates discussed:    Additional Comments:  Marilu Favre, RN 10/03/2015, 8:59 AM

## 2015-10-03 NOTE — Progress Notes (Signed)
Patient ID: Nancy [name], female   DOB: [birth year], 75 y.o.   MRN: [id number]   Changed the pts wound vac dressing.  Pt found procedure painful.  Tissue was healthy.  No signs of infection.  Consulted wound care nurse after completed procedure.  This will need to be done again on Monday.   Tanaisha Pittman [place], PA-C

## 2015-10-03 NOTE — Progress Notes (Signed)
    Subjective: 2 Days Post-Op Procedure(s) (LRB): LUMBAR WOUND DEBRIDEMENT (N/A) Patient reports pain as 5 on 0-10 scale.  Sitting in chair. Pt found change of wound vac dressing very painful Denies CP or SOB.  Voiding without difficulty. Positive flatus.Pt had an episode of confusion last night.  She reports she was having vivid dreams and woke up and did not know where she was. Pt reports being oriented today. Objective: Vital signs in last 24 hours: Temp:  [98.3 F (36.8 C)-99.5 F (37.5 C)] 98.8 F (37.1 C) (09/22 1100) Pulse Rate:  [76-100] 97 (09/22 1100) Resp:  [16-21] 20 (09/22 1100) BP: (102-162)/(46-84) 146/84 (09/22 1100) SpO2:  [95 %-100 %] 100 % (09/22 1100)  Intake/Output from previous day: 09/21 0701 - 09/22 0700 In: 1968.8 [P.O.:250; I.V.:1718.8] Out: 700 [Urine:700] Intake/Output this shift: Total I/O In: 1216.3 [I.V.:756.3; IV Piggyback:460] Out: -   Labs:  Recent Labs  10/01/15 1711 10/03/15 0816  HGB 9.6* 7.7*    Recent Labs  10/01/15 1711 10/03/15 0816  WBC 13.0* 7.4  RBC 3.62* 3.10*  HCT 29.3* 24.8*  PLT 534* 417*    Recent Labs  10/01/15 1711 10/01/15 2044  NA 133* 135  K 6.3* 3.9  CL 97* 99*  CO2 25 27  BUN 11 11  CREATININE 1.07* 1.00  GLUCOSE 142* 164*  CALCIUM 9.4 9.0    Recent Labs  10/01/15 1711  INR 1.18    Physical Exam: Neurologically intact ABD soft Sensation intact distally Dorsiflexion/Plantar flexion intact Incision: wound vac in place Compartment soft Pt oriented x3  Assessment/Plan: 2 Days Post-Op Procedure(s) (LRB): LUMBAR WOUND DEBRIDEMENT (N/A) Advance diet Up with therapy  IV dilaudid has been discontinued because of disorientation. One time dose for wound vac dressing change Decreased Hgb - started [place] Decreasing WBC count Continue to be monitored by medicine/ID Consider DC to SNF next week  [place], Darla Lesches for Dr. Melina Schools [facility] [phone] 10/03/2015,  12:01 PM    Patient ID: Nancy [name], female   DOB: 28-Oct-1940, 75 y.o.   MRN: [id number]

## 2015-10-03 NOTE — Progress Notes (Signed)
INFECTIOUS DISEASE PROGRESS NOTE  ID: Nancy [name] is a 75 y.o. female with  Principal Problem:   Wound infection ([place]) Active Problems:   Essential hypertension, benign   Diastolic dysfunction   Back pain   Gout   Hyperlipidemia   Type 2 diabetes mellitus (HCC)   Angioedema  Subjective: Nancy [name] has had improvement in her back pain. She denies fever, cough, or burning with urination.  Confused currently, want's to know what it going on.  Abtx:  Anti-infectives    Start     Dose/Rate Route Frequency Ordered Stop   10/02/15 1930  cefTRIAXone (ROCEPHIN) 2 g in dextrose 5 % 50 mL IVPB     2 g 100 mL/hr over 30 Minutes Intravenous Every 24 hours 10/02/15 1826     10/02/15 0830  vancomycin (VANCOCIN) IVPB 750 mg/150 ml premix     750 mg 150 mL/hr over 60 Minutes Intravenous Every 12 hours 10/01/15 2022     10/01/15 2100  ceFAZolin (ANCEF) IVPB 1 g/50 mL premix     1 g 100 mL/hr over 30 Minutes Intravenous Every 8 hours 10/01/15 2037 10/02/15 0607   10/01/15 2030  vancomycin (VANCOCIN) 1,500 mg in sodium chloride 0.9 % 500 mL IVPB     1,500 mg 250 mL/hr over 120 Minutes Intravenous  Once 10/01/15 2022 10/01/15 2322   10/01/15 2030  piperacillin-tazobactam (ZOSYN) IVPB 3.375 g  Status:  Discontinued     3.375 g 12.5 mL/hr over 240 Minutes Intravenous Every 8 hours 10/01/15 2022 10/02/15 1825      Medications: I have reviewed the patient's current medications.  Objective: Vital signs in last 24 hours: Temp:  [98.3 F (36.8 C)-99.5 F (37.5 C)] 99 F (37.2 C) (09/22 0658) Pulse Rate:  [76-100] 100 (09/22 0658) Resp:  [16-21] 21 (09/22 0658) BP: (102-162)/(46-69) 162/69 (09/22 0658) SpO2:  [94 %-98 %] 98 % (09/22 0658)   General: Alert and oriented  Cardiac: regular rate and rhythm, no murmur  Pulm: breath sounds clear bilateral  Abdomen: normal bowel sounds, soft, non tender, non distended  Back: two wound vacs in place clean, dry, and intact draining  serosanguinous fluid    Lab Results  Recent Labs  10/01/15 1711 10/01/15 2044 10/03/15 0816  WBC 13.0*  --  7.4  HGB 9.6*  --  7.7*  HCT 29.3*  --  24.8*  NA 133* 135  --   K 6.3* 3.9  --   CL 97* 99*  --   CO2 25 27  --   BUN 11 11  --   CREATININE 1.07* 1.00  --    Liver Panel  Recent Labs  10/01/15 2044  PROT 7.4  ALBUMIN 2.8*  AST 18  ALT 12*  ALKPHOS 77  BILITOT 0.5   Sedimentation Rate No results for input(s): ESRSEDRATE in the last 72 hours. C-Reactive Protein No results for input(s): CRP in the last 72 hours.  Microbiology: Recent Results (from the past 240 hour(s))  Surgical pcr screen     Status: None   Collection Time: 10/01/15  4:56 PM  Result Value Ref Range Status   MRSA, PCR NEGATIVE NEGATIVE Final   Staphylococcus aureus NEGATIVE NEGATIVE Final    Comment:        The Xpert SA Assay (FDA approved for NASAL specimens in patients over 70 years of age), is one component of a comprehensive surveillance program.  Test performance has been validated by [facility] for patients greater  than or equal to 60 year old. It is not intended to diagnose infection nor to guide or monitor treatment.   Aerobic/Anaerobic Culture (surgical/deep wound)     Status: None (Preliminary result)   Collection Time: 10/01/15  5:44 PM  Result Value Ref Range Status   Specimen Description WOUND  Final   Special Requests RIGHT LUMBAR SPEC A  Final   Gram Stain NO WBC SEEN NO ORGANISMS SEEN   Final   Culture NO GROWTH < 24 HOURS  Final   Report Status PENDING  Incomplete  Aerobic/Anaerobic Culture (surgical/deep wound)     Status: None (Preliminary result)   Collection Time: 10/01/15  5:51 PM  Result Value Ref Range Status   Specimen Description WOUND  Final   Special Requests LEFT LUMBAR SPEC B  Final   Gram Stain NO WBC SEEN NO ORGANISMS SEEN   Final   Culture NO GROWTH < 24 HOURS  Final   Report Status PENDING  Incomplete    Studies/Results: No results  found.   Assessment/Plan: Post op wound dehiscence - unclear if this was related to infection or multifactorial in the setting of DM and immobility  Uncontrolled DM (HbA1c 8/30 8.6) ? sundowning  Continue vancomycin and ceftriaxone for coverage until wound cultures have resulted Follow up intraoperative wound culture   Total days of antibiotics: 2 vancomycin 1 ceftriaxone  Completed 2 days of ancef          [name] Infectious Diseases (pager) [phone] [email or web address] 10/03/2015, 9:06 AM  LOS: 2 days

## 2015-10-04 DIAGNOSIS — E1169 Type 2 diabetes mellitus with other specified complication: Secondary | ICD-10-CM

## 2015-10-04 DIAGNOSIS — L089 Local infection of the skin and subcutaneous tissue, unspecified: Secondary | ICD-10-CM

## 2015-10-04 DIAGNOSIS — B9689 Other specified bacterial agents as the cause of diseases classified elsewhere: Secondary | ICD-10-CM

## 2015-10-04 LAB — CBC
HEMATOCRIT: 24.5 % — AB (ref 36.0–46.0)
HEMOGLOBIN: 7.5 g/dL — AB (ref 12.0–15.0)
MCH: 24.8 pg — AB (ref 26.0–34.0)
MCHC: 30.6 g/dL (ref 30.0–36.0)
MCV: 80.9 fL (ref 78.0–100.0)
PLATELETS: 478 10*3/uL — AB (ref 150–400)
RBC: 3.03 MIL/uL — AB (ref 3.87–5.11)
RDW: 16.4 % — ABNORMAL HIGH (ref 11.5–15.5)
WBC: 7.9 10*3/uL (ref 4.0–10.5)

## 2015-10-04 LAB — VANCOMYCIN, TROUGH: Vancomycin Tr: 22 ug/mL (ref 15–20)

## 2015-10-04 LAB — HIGH SENSITIVITY CRP: CRP, High Sensitivity: 131.03 mg/L — ABNORMAL HIGH (ref 0.00–3.00)

## 2015-10-04 LAB — GLUCOSE, CAPILLARY
GLUCOSE-CAPILLARY: 130 mg/dL — AB (ref 65–99)
GLUCOSE-CAPILLARY: 174 mg/dL — AB (ref 65–99)
Glucose-Capillary: 109 mg/dL — ABNORMAL HIGH (ref 65–99)
Glucose-Capillary: 218 mg/dL — ABNORMAL HIGH (ref 65–99)

## 2015-10-04 MED ORDER — OXYCODONE HCL ER 15 MG PO T12A
15.0000 mg | EXTENDED_RELEASE_TABLET | Freq: Two times a day (BID) | ORAL | Status: DC
  Administered 2015-10-04 – 2015-10-05 (×3): 15 mg via ORAL
  Filled 2015-10-04 (×3): qty 1

## 2015-10-04 MED ORDER — HYDROMORPHONE HCL 1 MG/ML IJ SOLN
1.0000 mg | Freq: Once | INTRAMUSCULAR | Status: AC
  Administered 2015-10-04: 1 mg via INTRAVENOUS
  Filled 2015-10-04: qty 1

## 2015-10-04 MED ORDER — SENNOSIDES-DOCUSATE SODIUM 8.6-50 MG PO TABS
1.0000 | ORAL_TABLET | Freq: Every evening | ORAL | Status: DC | PRN
  Administered 2015-10-05 – 2015-10-06 (×2): 1 via ORAL
  Filled 2015-10-04 (×2): qty 1

## 2015-10-04 NOTE — Progress Notes (Signed)
Pharmacy Antibiotic Note  Nancy [name] is a 75 y.o. female admitted on 10/01/2015 with lumbar wound infection s/p debride on 9/20.  Pharmacy has been consulted for vancomycin dosing.  Vanc trough looks above goal but was drawn only 9hr after last dose and true trough calculates to ~16, within goal.  Plan: This patient's current antibiotics will be continued without adjustments.  Height: 5' 1.5" (156.2 cm) Weight: 191 lb 5.8 oz (86.8 kg) IBW/kg (Calculated) : 48.95  Temp (24hrs), Avg:99.3 F (37.4 C), Min:98.8 F (37.1 C), Max:99.9 F (37.7 C)   Recent Labs Lab 10/01/15 1711 10/01/15 2044 10/03/15 0816 10/04/15 0552  WBC 13.0*  --  7.4 7.9  CREATININE 1.07* 1.00  --   --   VANCOTROUGH  --   --   --  22*    Estimated Creatinine Clearance: 50.7 mL/min (by C-G formula based on SCr of 1 mg/dL).    Allergies  Allergen Reactions  . Morphine And Related Swelling    10/02/15 - given Toradol + morphine and RN noted tongue swelling - given Epi + Benadryl Has had morphine in past w/o problem  . Toradol [Ketorolac Tromethamine] Swelling    10/02/15 - given Toradol + morphine and RN noted tongue swelling - given Epi + Benadryl Has taken ibuprofen in past w/o problem  . Codeine Other (See Comments)    headache    Antimicrobials this admission: 9/21 Ceftriaxone >> 9/20 Vancomycin >> 9/20 Zosyn >> 9/21 9/20 Ancef pre-op 2g IV x1  Microbiology results: 9/20 Lumbar wound cx: ngtd 9/20 MRSA pcr: negative  Thank you for allowing pharmacy to be a part of this patient's care.  Wynona Neat, PharmD, BCPS  10/04/2015 7:24 AM

## 2015-10-04 NOTE — Progress Notes (Signed)
INFECTIOUS DISEASE PROGRESS NOTE  ID: Nancy [name] is a 75 y.o. female with  Principal Problem:   Wound infection ([place]) Active Problems:   Essential hypertension, benign   Diastolic dysfunction   Back pain   Gout   Hyperlipidemia   Type 2 diabetes mellitus (HCC)   Angioedema  Subjective: Resting quietly, still some confusion per family.   Abtx:  Anti-infectives    Start     Dose/Rate Route Frequency Ordered Stop   10/02/15 1930  cefTRIAXone (ROCEPHIN) 2 g in dextrose 5 % 50 mL IVPB     2 g 100 mL/hr over 30 Minutes Intravenous Every 24 hours 10/02/15 1826     10/02/15 0830  vancomycin (VANCOCIN) IVPB 750 mg/150 ml premix     750 mg 150 mL/hr over 60 Minutes Intravenous Every 12 hours 10/01/15 2022     10/01/15 2100  ceFAZolin (ANCEF) IVPB 1 g/50 mL premix     1 g 100 mL/hr over 30 Minutes Intravenous Every 8 hours 10/01/15 2037 10/02/15 0607   10/01/15 2030  vancomycin (VANCOCIN) 1,500 mg in sodium chloride 0.9 % 500 mL IVPB     1,500 mg 250 mL/hr over 120 Minutes Intravenous  Once 10/01/15 2022 10/01/15 2322   10/01/15 2030  piperacillin-tazobactam (ZOSYN) IVPB 3.375 g  Status:  Discontinued     3.375 g 12.5 mL/hr over 240 Minutes Intravenous Every 8 hours 10/01/15 2022 10/02/15 1825      Medications:  Scheduled: . allopurinol  200 mg Oral Daily  . amLODipine  10 mg Oral q morning - 10a  . cefTRIAXone (ROCEPHIN)  IV  2 g Intravenous Q24H  . ferrous sulfate  325 mg Oral TID WC  . furosemide  20 mg Oral Daily  . glipiZIDE  5 mg Oral QAC breakfast  . insulin aspart  0-15 Units Subcutaneous TID WC  . insulin aspart  0-5 Units Subcutaneous QHS  . lubiprostone  24 mcg Oral BID WC  . magnesium citrate  0.5 Bottle Oral Once  . metoprolol  100 mg Oral q morning - 10a  . oxyCODONE  15 mg Oral Q12H  . pantoprazole  40 mg Oral Daily  . vancomycin  750 mg Intravenous Q12H    Objective: Vital signs in last 24 hours: Temp:  [99.1 F (37.3 C)-99.9 F (37.7 C)]  99.1 F (37.3 C) (09/23 0500) Pulse Rate:  [96-98] 98 (09/23 0500) Resp:  [20] 20 (09/23 0500) BP: (131-148)/(65) 148/65 (09/23 0500) SpO2:  [93 %-98 %] 98 % (09/23 0500)   General appearance: cooperative and no distress Incision/Wound: clean, VACs in place.   Lab Results  Recent Labs  10/01/15 1711 10/01/15 2044 10/03/15 0816 10/04/15 0552  WBC 13.0*  --  7.4 7.9  HGB 9.6*  --  7.7* 7.5*  HCT 29.3*  --  24.8* 24.5*  NA 133* 135  --   --   K 6.3* 3.9  --   --   CL 97* 99*  --   --   CO2 25 27  --   --   BUN 11 11  --   --   CREATININE 1.07* 1.00  --   --    Liver Panel  Recent Labs  10/01/15 2044  PROT 7.4  ALBUMIN 2.8*  AST 18  ALT 12*  ALKPHOS 77  BILITOT 0.5   Sedimentation Rate  Recent Labs  10/03/15 0816  ESRSEDRATE 135*   C-Reactive Protein No results for input(s): CRP in  the last 72 hours.  Microbiology: Recent Results (from the past 240 hour(s))  Surgical pcr screen     Status: None   Collection Time: 10/01/15  4:56 PM  Result Value Ref Range Status   MRSA, PCR NEGATIVE NEGATIVE Final   Staphylococcus aureus NEGATIVE NEGATIVE Final    Comment:        The Xpert SA Assay (FDA approved for NASAL specimens in patients over 55 years of age), is one component of a comprehensive surveillance program.  Test performance has been validated by [facility] for patients greater than or equal to 58 year old. It is not intended to diagnose infection nor to guide or monitor treatment.   Aerobic/Anaerobic Culture (surgical/deep wound)     Status: None (Preliminary result)   Collection Time: 10/01/15  5:44 PM  Result Value Ref Range Status   Specimen Description WOUND  Final   Special Requests RIGHT LUMBAR SPEC A  Final   Gram Stain NO WBC SEEN NO ORGANISMS SEEN   Final   Culture RARE GRAM NEGATIVE RODS  Final   Report Status PENDING  Incomplete  Aerobic/Anaerobic Culture (surgical/deep wound)     Status: None (Preliminary result)   Collection  Time: 10/01/15  5:51 PM  Result Value Ref Range Status   Specimen Description WOUND  Final   Special Requests LEFT LUMBAR SPEC B  Final   Gram Stain NO WBC SEEN NO ORGANISMS SEEN   Final   Culture   Final    NO GROWTH 2 DAYS NO ANAEROBES ISOLATED; CULTURE IN PROGRESS FOR 5 DAYS   Report Status PENDING  Incomplete    Studies/Results: No results found.   Assessment/Plan: Post op wound dehiscence- suspected infection Uncontrolled DM (HbA1c 8/30 8.6) ? Sundowning  anbx day 3  Will stop vanco Continue ceftriaxone alone Await Cx's (so far growing GNR) Place PIC (discussed with family, she will go to rehab at d/c.          Bobby Rumpf Infectious Diseases (pager) [phone] [email or web address] 10/04/2015, 3:06 PM  LOS: 3 days

## 2015-10-04 NOTE — Progress Notes (Signed)
   Subjective: 3 Days Post-Op Procedure(s) (LRB): LUMBAR WOUND DEBRIDEMENT (N/A)  Pt had some pain issues last night but somewhat better this am Denies any new symptoms or issues Currently lying on right side Patient reports pain as moderate.  Objective:   VITALS:   Vitals:   10/03/15 2029 10/04/15 0500  BP: 131/65 (!) 148/65  Pulse: 96 98  Resp: 20 20  Temp: 99.9 F (37.7 C) 99.1 F (37.3 C)    Wound vac in place to lumbar spine in good position nv intact distally  No signs of drainage and suction is appropriate  LABS  Recent Labs  10/01/15 1711 10/03/15 0816 10/04/15 0552  HGB 9.6* 7.7* 7.5*  HCT 29.3* 24.8* 24.5*  WBC 13.0* 7.4 7.9  PLT 534* 417* 478*     Recent Labs  10/01/15 1711 10/01/15 2044  NA 133* 135  K 6.3* 3.9  BUN 11 11  CREATININE 1.07* 1.00  GLUCOSE 142* 164*     Assessment/Plan: 3 Days Post-Op Procedure(s) (LRB): LUMBAR WOUND DEBRIDEMENT (N/A) Plan to d/c to SNF next week Continue current treatment Pain management Pulmonary toilet    Brad Ramon Brant, MPAS, PA-C  10/04/2015, 8:30 AM

## 2015-10-04 NOTE — Progress Notes (Signed)
CONSULT PROGRESS NOTE [facility]   Nancy [name]   [id number] DOB: 10/09/1940  DOA: 10/01/2015 PCP: Antony Blackbird, MD   Brief Narrative:   75 year old female with a past medical history of GERD, PUD, diastolic CHF, pulmonary hypertension, essential hypertension, gout, hyperlipidemia, type 2 diabetes, osteoarthritis, osteopenia who is 2 weeks s/p TLIF. aAdmitted with healing complications with drainage and dehiscence of wound. She just underwent a formal I&D and application of wound VAC by Dr. Rolena Infante and we are being consulted for medical management. Patient on IV abx, ID consulted.    Subjective: Patient seen and examined today with daughter at bedside. Patient continues to c/o back pain, now severe from the procedure. She denies fever, chest pain, dyspnea, palpitations, abdominal pain, nausea, emesis, diarrhea or constipation.    Assessment & Plan:  Back pain - Severe 2/2 orthopedic procedure  Pain control adjusted to OxyContin - will give longer relieve, and will continue Dilaudid for breakthrough.  1 mg of Dilaudid ordered OTO given severe pain during my exam.   Delirium - Resolved, today alert and oriented although in severe pain.  This could have been pain related vs opioid induced. Is important to keep pain well control, will help with recovery, also there is a fine line between over treating pain which can cause this side effects.    Wound infection ([place]) - Cultures no growth UTD Continue treatment and postop care per Dr. Rolena Infante. ID recommendations appreciated  Continue Ceftriaxone  Continue Vanco  Follow-up wound culture and sensitivity  Essential hypertension, benign - BP stable  Lisinopril was held due to angioedema - last night which has resolved.  Continue amlodipine 10 mg by mouth daily. Continue metoprolol 100 mg by mouth daily. Continue furosemide 20 mg by mouth daily. Monitor blood pressure periodically.  Diastolic dysfunction Continue  furosemide and beta blocker. Lisinopril held due to angioedema.  Gout - Stable  Continue colchicine 0.6 milligram by mouth daily. Continue allopurinol 200 mg by mouth daily.  Hyperlipidemia - stable  Follow-up with PCP.  Type 2 diabetes mellitus (HCC) - stable Carbohydrate modified diet. Continue glipizide 5 mg by mouth daily before breakfast  Angioedema - Resolved  Patient eating and breathing room air. Likely 2/2 ACEi vs NSAIDs allergic rx.  ACEi hold - ? If ARBS needed  Can d/c telemetry and continues pulse ox  DVT prophylaxis: SCV Code Status: Full  Family Communication: Daughter at bedside, all questions answered Disposition Plan:  Per Ortho   Objective: Vitals:   10/03/15 1100 10/03/15 1451 10/03/15 2029 10/04/15 0500  BP: (!) 146/84 (!) 108/45 131/65 (!) 148/65  Pulse: 97 78 96 98  Resp: 20 20 20 20   Temp: 98.8 F (37.1 C) 99.2 F (37.3 C) 99.9 F (37.7 C) 99.1 F (37.3 C)  TempSrc: Oral Oral Oral Oral  SpO2: 100% 97% 93% 98%  Weight:      Height:        Intake/Output Summary (Last 24 hours) at 10/04/15 1157 Last data filed at 10/04/15 0541  Gross per 24 hour  Intake           184.83 ml  Output              102 ml  Net            82.83 ml   Filed Weights   10/01/15 1629  Weight: 86.8 kg (191 lb 5.8 oz)    Examination:   General:  Afebrile, Severe distress due to pain  Cardiovascular: S1-S2 RRR.   Respiratory: CTA bilaterally.   Abdomen: BS positive, soft, nontender.   Skin: Dressing and wound VAC in place.   Musculoskeletal: No clubbing or cyanosis.   Neurologic: Awake, alert, oriented 3, moves all extremities, but unable to fully evaluate due to back pain   Data Reviewed: I have personally reviewed following labs and imaging studies  CBC:  Recent Labs Lab 10/01/15 1711 10/03/15 0816 10/04/15 0552  WBC 13.0* 7.4 7.9  NEUTROABS 9.6*  --   --   HGB 9.6* 7.7* 7.5*  HCT 29.3* 24.8* 24.5*  MCV 80.9 80.0 80.9  PLT 534* 417*  123456*   Basic Metabolic Panel:  Recent Labs Lab 10/01/15 1711 10/01/15 2044  NA 133* 135  K 6.3* 3.9  CL 97* 99*  CO2 25 27  GLUCOSE 142* 164*  BUN 11 11  CREATININE 1.07* 1.00  CALCIUM 9.4 9.0  MG  --  1.3*  PHOS  --  4.7*   GFR: Estimated Creatinine Clearance: 50.7 mL/min (by C-G formula based on SCr of 1 mg/dL). Liver Function Tests:  Recent Labs Lab 10/01/15 2044  AST 18  ALT 12*  ALKPHOS 77  BILITOT 0.5  PROT 7.4  ALBUMIN 2.8*   No results for input(s): LIPASE, AMYLASE in the last 168 hours. No results for input(s): AMMONIA in the last 168 hours. Coagulation Profile:  Recent Labs Lab 10/01/15 1711  INR 1.18   Cardiac Enzymes: No results for input(s): CKTOTAL, CKMB, CKMBINDEX, TROPONINI in the last 168 hours. BNP (last 3 results) No results for input(s): PROBNP in the last 8760 hours. HbA1C:  Recent Labs  10/01/15 2044  HGBA1C 7.3*   CBG:  Recent Labs Lab 10/03/15 0744 10/03/15 1212 10/03/15 1610 10/03/15 2137 10/04/15 0802  GLUCAP 129* 232* 92 160* 130*   Lipid Profile: No results for input(s): CHOL, HDL, LDLCALC, TRIG, CHOLHDL, LDLDIRECT in the last 72 hours. Thyroid Function Tests: No results for input(s): TSH, T4TOTAL, FREET4, T3FREE, THYROIDAB in the last 72 hours. Anemia Panel: No results for input(s): VITAMINB12, FOLATE, FERRITIN, TIBC, IRON, RETICCTPCT in the last 72 hours. Sepsis Labs: No results for input(s): PROCALCITON, LATICACIDVEN in the last 168 hours.  Recent Results (from the past 240 hour(s))  Surgical pcr screen     Status: None   Collection Time: 10/01/15  4:56 PM  Result Value Ref Range Status   MRSA, PCR NEGATIVE NEGATIVE Final   Staphylococcus aureus NEGATIVE NEGATIVE Final    Comment:        The Xpert SA Assay (FDA approved for NASAL specimens in patients over 45 years of age), is one component of a comprehensive surveillance program.  Test performance has been validated by [facility] for patients  greater than or equal to 37 year old. It is not intended to diagnose infection nor to guide or monitor treatment.   Aerobic/Anaerobic Culture (surgical/deep wound)     Status: None (Preliminary result)   Collection Time: 10/01/15  5:44 PM  Result Value Ref Range Status   Specimen Description WOUND  Final   Special Requests RIGHT LUMBAR SPEC A  Final   Gram Stain NO WBC SEEN NO ORGANISMS SEEN   Final   Culture RARE GRAM NEGATIVE RODS  Final   Report Status PENDING  Incomplete  Aerobic/Anaerobic Culture (surgical/deep wound)     Status: None (Preliminary result)   Collection Time: 10/01/15  5:51 PM  Result Value Ref Range Status   Specimen Description WOUND  Final  Special Requests LEFT LUMBAR SPEC B  Final   Gram Stain NO WBC SEEN NO ORGANISMS SEEN   Final   Culture   Final    NO GROWTH 2 DAYS NO ANAEROBES ISOLATED; CULTURE IN PROGRESS FOR 5 DAYS   Report Status PENDING  Incomplete      Radiology Studies: No results found.   Scheduled Meds: . allopurinol  200 mg Oral Daily  . amLODipine  10 mg Oral q morning - 10a  . cefTRIAXone (ROCEPHIN)  IV  2 g Intravenous Q24H  . ferrous sulfate  325 mg Oral TID WC  . furosemide  20 mg Oral Daily  . glipiZIDE  5 mg Oral QAC breakfast  .  HYDROmorphone (DILAUDID) injection  1 mg Intravenous Once  . insulin aspart  0-15 Units Subcutaneous TID WC  . insulin aspart  0-5 Units Subcutaneous QHS  . lubiprostone  24 mcg Oral BID WC  . magnesium citrate  0.5 Bottle Oral Once  . metoprolol  100 mg Oral q morning - 10a  . oxyCODONE  15 mg Oral Q12H  . pantoprazole  40 mg Oral Daily  . vancomycin  750 mg Intravenous Q12H   Continuous Infusions: . sodium chloride 10 mL/hr at 10/03/15 1231     LOS: 3 days   Nancy Oman, MD [facility] Pager [phone]  If 7PM-7AM, please contact night-coverage [email or web address] Password [facility] 10/04/2015, 11:57 AM

## 2015-10-05 DIAGNOSIS — Z1612 Extended spectrum beta lactamase (ESBL) resistance: Secondary | ICD-10-CM

## 2015-10-05 DIAGNOSIS — A498 Other bacterial infections of unspecified site: Secondary | ICD-10-CM

## 2015-10-05 DIAGNOSIS — B9620 Unspecified Escherichia coli [E. coli] as the cause of diseases classified elsewhere: Secondary | ICD-10-CM

## 2015-10-05 LAB — CBC
HEMATOCRIT: 24.6 % — AB (ref 36.0–46.0)
HEMOGLOBIN: 7.5 g/dL — AB (ref 12.0–15.0)
MCH: 24.5 pg — ABNORMAL LOW (ref 26.0–34.0)
MCHC: 30.5 g/dL (ref 30.0–36.0)
MCV: 80.4 fL (ref 78.0–100.0)
Platelets: 465 10*3/uL — ABNORMAL HIGH (ref 150–400)
RBC: 3.06 MIL/uL — AB (ref 3.87–5.11)
RDW: 16.3 % — ABNORMAL HIGH (ref 11.5–15.5)
WBC: 6.8 10*3/uL (ref 4.0–10.5)

## 2015-10-05 LAB — GLUCOSE, CAPILLARY
GLUCOSE-CAPILLARY: 203 mg/dL — AB (ref 65–99)
GLUCOSE-CAPILLARY: 181 mg/dL — AB (ref 65–99)
Glucose-Capillary: 122 mg/dL — ABNORMAL HIGH (ref 65–99)
Glucose-Capillary: 85 mg/dL (ref 65–99)

## 2015-10-05 MED ORDER — SODIUM CHLORIDE 0.9% FLUSH
10.0000 mL | INTRAVENOUS | Status: DC | PRN
  Administered 2015-10-06: 10 mL
  Filled 2015-10-05: qty 40

## 2015-10-05 MED ORDER — SODIUM CHLORIDE 0.9% FLUSH
10.0000 mL | Freq: Two times a day (BID) | INTRAVENOUS | Status: DC
  Administered 2015-10-05 – 2015-10-07 (×3): 10 mL

## 2015-10-05 MED ORDER — SODIUM CHLORIDE 0.9 % IV SOLN
1.0000 g | INTRAVENOUS | Status: DC
  Administered 2015-10-05 – 2015-10-08 (×4): 1 g via INTRAVENOUS
  Filled 2015-10-05 (×4): qty 1

## 2015-10-05 MED ORDER — SODIUM CHLORIDE 0.9 % IV BOLUS (SEPSIS)
500.0000 mL | Freq: Once | INTRAVENOUS | Status: AC
  Administered 2015-10-05 (×2): 500 mL via INTRAVENOUS

## 2015-10-05 NOTE — Progress Notes (Signed)
Discuss the PICC insertion including risks and benefits.  Pt. Undecided and wants to think about it.  Will follow up later.

## 2015-10-05 NOTE — Progress Notes (Signed)
   Subjective: 4 Days Post-Op Procedure(s) (LRB): LUMBAR WOUND DEBRIDEMENT (N/A) Patient reports pain as mild.   Patient seen in rounds with Dr. Gladstone Lighter for Dr. Rolena Infante. Patient is well, and has had no acute complaints or problems. Sitting up in chair comfortably. No issues overnight.   Objective: Vital signs in last 24 hours: Temp:  [98.5 F (36.9 C)-98.7 F (37.1 C)] 98.7 F (37.1 C) (09/24 0609) Pulse Rate:  [81-91] 91 (09/24 0609) Resp:  [18-20] 18 (09/24 0609) BP: (109-114)/(39-54) 114/54 (09/24 0609) SpO2:  [93 %-95 %] 93 % (09/24 0609)  Intake/Output from previous day:  Intake/Output Summary (Last 24 hours) at 10/05/15 0809 Last data filed at 10/04/15 2000  Gross per 24 hour  Intake              785 ml  Output                0 ml  Net              785 ml     Labs:  Recent Labs  10/03/15 0816 10/04/15 0552 10/05/15 0648  HGB 7.7* 7.5* 7.5*    Recent Labs  10/04/15 0552 10/05/15 0648  WBC 7.9 6.8  RBC 3.03* 3.06*  HCT 24.5* 24.6*  PLT 478* 465*   EXAM General - Patient is Alert and Oriented Extremity - Neurologically intact Intact pulses distally Dorsiflexion/Plantar flexion intact Dressing/Incision - clean, dry Motor Function - intact, moving foot and toes well on exam.   Past Medical History:  Diagnosis Date  . Breast cyst    left breast  . Chest pain   . Complication of anesthesia 1986   slow to awaken after cholecystectomy  . Diastolic dysfunction    with mild pulmonary HTN by echo 07/2011  . Gastric ulcer   . GERD (gastroesophageal reflux disease)   . Gout   . Hyperlipidemia   . Hypertension   . Osteoarthritis   . Osteopenia   . Shortness of breath dyspnea    with activity  . Spine deformity    lower spine  . Type II diabetes mellitus (HCC)     Assessment/Plan: 4 Days Post-Op Procedure(s) (LRB): LUMBAR WOUND DEBRIDEMENT (N/A) Principal Problem:   Wound infection (HCC) Active Problems:   Essential hypertension, benign  Diastolic dysfunction   Back pain   Gout   Hyperlipidemia   Type 2 diabetes mellitus (HCC)   Angioedema  Estimated body mass index is 35.57 kg/m as calculated from the following:   Height as of this encounter: 5' 1.5" (1.562 m).   Weight as of this encounter: 86.8 kg (191 lb 5.8 oz). Advance diet Up with therapy  DVT Prophylaxis - SCDs Weight-Bearing as tolerated to   Will give bolus of NaCl 500 over 2 hours. Will hold off on transfusion for now. Hold amlodipine today due to hypotension but will keep metoprolol and lasix due to history of diastolic dysfunction. Dressing change tomorrow per Dr. Rolena Infante.   Ardeen Jourdain, PA-C Orthopaedic Surgery 10/05/2015, 8:09 AM

## 2015-10-05 NOTE — Progress Notes (Addendum)
INFECTIOUS DISEASE PROGRESS NOTE  ID: Nancy [name] is a 75 y.o. female with  Principal Problem:   Wound infection ([place]) Active Problems:   Essential hypertension, benign   Diastolic dysfunction   Back pain   Gout   Hyperlipidemia   Type 2 diabetes mellitus (HCC)   Angioedema  Subjective: Per family:  No BM in 5 days Getting depressed Has ambulated Prev urine incontenence.   Abtx:  Anti-infectives    Start     Dose/Rate Route Frequency Ordered Stop   10/05/15 1200  ertapenem (INVANZ) 1 g in sodium chloride 0.9 % 50 mL IVPB     1 g 100 mL/hr over 30 Minutes Intravenous Every 24 hours 10/05/15 1156     10/02/15 1930  cefTRIAXone (ROCEPHIN) 2 g in dextrose 5 % 50 mL IVPB  Status:  Discontinued     2 g 100 mL/hr over 30 Minutes Intravenous Every 24 hours 10/02/15 1826 10/05/15 1156   10/02/15 0830  vancomycin (VANCOCIN) IVPB 750 mg/150 ml premix  Status:  Discontinued     750 mg 150 mL/hr over 60 Minutes Intravenous Every 12 hours 10/01/15 2022 10/04/15 1533   10/01/15 2100  ceFAZolin (ANCEF) IVPB 1 g/50 mL premix     1 g 100 mL/hr over 30 Minutes Intravenous Every 8 hours 10/01/15 2037 10/02/15 0607   10/01/15 2030  vancomycin (VANCOCIN) 1,500 mg in sodium chloride 0.9 % 500 mL IVPB     1,500 mg 250 mL/hr over 120 Minutes Intravenous  Once 10/01/15 2022 10/01/15 2322   10/01/15 2030  piperacillin-tazobactam (ZOSYN) IVPB 3.375 g  Status:  Discontinued     3.375 g 12.5 mL/hr over 240 Minutes Intravenous Every 8 hours 10/01/15 2022 10/02/15 1825      Medications:  Scheduled: . allopurinol  200 mg Oral Daily  . amLODipine  10 mg Oral q morning - 10a  . ertapenem  1 g Intravenous Q24H  . ferrous sulfate  325 mg Oral TID WC  . furosemide  20 mg Oral Daily  . glipiZIDE  5 mg Oral QAC breakfast  . insulin aspart  0-15 Units Subcutaneous TID WC  . insulin aspart  0-5 Units Subcutaneous QHS  . lubiprostone  24 mcg Oral BID WC  . magnesium citrate  0.5 Bottle Oral  Once  . metoprolol  100 mg Oral q morning - 10a  . oxyCODONE  15 mg Oral Q12H  . pantoprazole  40 mg Oral Daily  . sodium chloride flush  10-40 mL Intracatheter Q12H    Objective: Vital signs in last 24 hours: Temp:  [98.5 F (36.9 C)-98.7 F (37.1 C)] 98.7 F (37.1 C) (09/24 0609) Pulse Rate:  [81-91] 91 (09/24 0609) Resp:  [18-20] 18 (09/24 0609) BP: (109-114)/(39-54) 114/54 (09/24 0609) SpO2:  [93 %-95 %] 93 % (09/24 0609)   General appearance: alert, fatigued and no distress Incision/Wound: clean, 2 vac in place  Lab Results  Recent Labs  10/04/15 0552 10/05/15 0648  WBC 7.9 6.8  HGB 7.5* 7.5*  HCT 24.5* 24.6*   Liver Panel No results for input(s): PROT, ALBUMIN, AST, ALT, ALKPHOS, BILITOT, BILIDIR, IBILI in the last 72 hours. Sedimentation Rate  Recent Labs  10/03/15 0816  ESRSEDRATE 135*   C-Reactive Protein No results for input(s): CRP in the last 72 hours.  Microbiology: Recent Results (from the past 240 hour(s))  Surgical pcr screen     Status: None   Collection Time: 10/01/15  4:56 PM  Result  Value Ref Range Status   MRSA, PCR NEGATIVE NEGATIVE Final   Staphylococcus aureus NEGATIVE NEGATIVE Final    Comment:        The Xpert SA Assay (FDA approved for NASAL specimens in patients over 70 years of age), is one component of a comprehensive surveillance program.  Test performance has been validated by [facility] for patients greater than or equal to 46 year old. It is not intended to diagnose infection nor to guide or monitor treatment.   Aerobic/Anaerobic Culture (surgical/deep wound)     Status: None (Preliminary result)   Collection Time: 10/01/15  5:44 PM  Result Value Ref Range Status   Specimen Description WOUND  Final   Special Requests RIGHT LUMBAR SPEC A  Final   Gram Stain NO WBC SEEN NO ORGANISMS SEEN   Final   Culture   Final    RARE ESCHERICHIA COLI Confirmed Extended Spectrum Beta-Lactamase Producer (ESBL) NO ANAEROBES  ISOLATED; CULTURE IN PROGRESS FOR 5 DAYS    Report Status PENDING  Incomplete   Organism ID, Bacteria ESCHERICHIA COLI  Final      Susceptibility   Escherichia coli - MIC*    AMPICILLIN >=32 RESISTANT Resistant     CEFAZOLIN >=64 RESISTANT Resistant     CEFEPIME 8 RESISTANT Resistant     CEFTAZIDIME <=1 RESISTANT Resistant     CEFTRIAXONE >=64 RESISTANT Resistant     CIPROFLOXACIN <=0.25 SENSITIVE Sensitive     GENTAMICIN <=1 SENSITIVE Sensitive     IMIPENEM <=0.25 SENSITIVE Sensitive     TRIMETH/SULFA >=320 RESISTANT Resistant     AMPICILLIN/SULBACTAM >=32 RESISTANT Resistant     PIP/TAZO <=4 SENSITIVE Sensitive     Extended ESBL POSITIVE Resistant     * RARE ESCHERICHIA COLI  Aerobic/Anaerobic Culture (surgical/deep wound)     Status: None (Preliminary result)   Collection Time: 10/01/15  5:51 PM  Result Value Ref Range Status   Specimen Description WOUND  Final   Special Requests LEFT LUMBAR SPEC B  Final   Gram Stain NO WBC SEEN NO ORGANISMS SEEN   Final   Culture   Final    NO GROWTH 2 DAYS NO ANAEROBES ISOLATED; CULTURE IN PROGRESS FOR 5 DAYS   Report Status PENDING  Incomplete    Studies/Results: No results found.   Assessment/Plan: Post op wound dehiscence- ESBL E coli Uncontrolled DM (HbA1c 8/30 8.6) ? Sundowning  Total days of antibiotics: 0 Invanz  Change her anbx to better match her bacteria Appreciate our [facility] for 6 weeks of Invanz.  Has PIC now To rehab Explained to family Available as needed.          Bobby Rumpf Infectious Diseases (pager) [phone] [email or web address] 10/05/2015, 11:57 AM  LOS: 4 days    Diagnosis: E coli wound infection  Culture Result: ESBL E coli  Allergies  Allergen Reactions  . Morphine And Related Swelling    10/02/15 - given Toradol + morphine and RN noted tongue swelling - given Epi + Benadryl Has had morphine in past w/o problem  . Toradol [Ketorolac Tromethamine] Swelling     10/02/15 - given Toradol + morphine and RN noted tongue swelling - given Epi + Benadryl Has taken ibuprofen in past w/o problem  . Codeine Other (See Comments)    headache    Discharge antibiotics: Invanz 1 g IVPB qday  Duration: 6 weeks End Date: 11-16-15  [facility] Care Per Protocol:  Labs weekly while on IV antibiotics: _x_ CBC with  differential __ BMP _x_ CMP _x_ CRP x__ ESR __ Vancomycin trough  Fax weekly labs to [phone]  Clinic Follow Up Appt: Primary MD

## 2015-10-05 NOTE — Progress Notes (Signed)
Peripherally Inserted Central Catheter/Midline Placement  The IV Nurse has discussed with the patient and/or persons authorized to consent for the patient, the purpose of this procedure and the potential benefits and risks involved with this procedure.  The benefits include less needle sticks, lab draws from the catheter, and the patient may be discharged home with the catheter. Risks include, but not limited to, infection, bleeding, blood clot (thrombus formation), and puncture of an artery; nerve damage and irregular heartbeat and possibility to perform a PICC exchange if needed/ordered by physician.  Alternatives to this procedure were also discussed.  Bard Power PICC patient education guide, fact sheet on infection prevention and patient information card has been provided to patient /or left at bedside.    PICC/Midline Placement Documentation  PICC Single Lumen 10/05/15 PICC Right Brachial 36 cm 1 cm (Active)  Indication for Insertion or Continuance of Line Home intravenous therapies (PICC only) 10/05/2015  3:23 PM  Exposed Catheter (cm) 1 cm 10/05/2015  3:23 PM  Site Assessment Clean;Dry;Intact 10/05/2015  3:23 PM  Line Status Flushed;Saline locked;Blood return noted 10/05/2015  3:23 PM  Dressing Type Transparent 10/05/2015  3:23 PM  Dressing Status Clean;Dry;Intact 10/05/2015  3:23 PM  Dressing Change Due 10/12/15 10/05/2015  3:23 PM       Nancy [name] 10/05/2015, 3:24 PM

## 2015-10-05 NOTE — Progress Notes (Signed)
CONSULT PROGRESS NOTE [facility]   ADALY MCLINN   [id number] DOB: [birth year]  DOA: 10/01/2015 PCP: Antony Blackbird, MD   Brief Narrative:   75 year old female with a past medical history of GERD, PUD, diastolic CHF, pulmonary hypertension, essential hypertension, gout, hyperlipidemia, type 2 diabetes, osteoarthritis, osteopenia who is 2 weeks s/p TLIF. aAdmitted with healing complications with drainage and dehiscence of wound. She just underwent a formal I&D and application of wound VAC by Dr. Rolena Infante and we are being consulted for medical management. Patient on IV abx, ID consulted. Wound cultures found to be E Coli ESBL +, patient started on Invanz.     Subjective: Patient seen and examined today with daughter at bedside. Patient continues to c/o back pain, now severe from the procedure. No changes. She denies fever, chest pain, dyspnea, palpitations, abdominal pain, nausea, emesis, diarrhea or constipation.    Assessment & Plan:  Wound infection ([place]) - E Coli - ESBL  Continue treatment and postop care per Dr. Rolena Infante. ID recommendations appreciated  D/c ceftriaxone - switch to ertapenem  D/c Vanco For PICC line today  Contact precautions   Back pain - Severe 2/2 orthopedic procedure - well control now  Pain control adjusted to OxyContin - will give longer relieve, and will continue Dilaudid for breakthrough.   Delirium - Resolved, today alert and oriented although in severe pain.  This could had been pain related vs opioid induced. Is important to keep pain well control, will help with recovery, also there is a fine line between over treating pain which can cause this side effects.   Essential hypertension, benign - BP stable  Lisinopril was held due to angioedema - last night which has resolved.  Continue amlodipine 10 mg by mouth daily. Continue metoprolol 100 mg by mouth daily. Continue furosemide 20 mg by mouth daily. Monitor blood pressure  periodically.  Diastolic dysfunction Continue furosemide and beta blocker. Lisinopril held due to angioedema.  Gout - Stable  Continue colchicine 0.6 milligram by mouth daily. Continue allopurinol 200 mg by mouth daily.  Hyperlipidemia - stable  Follow-up with PCP.  Type 2 diabetes mellitus (HCC) - stable Carbohydrate modified diet. Continue glipizide 5 mg by mouth daily before breakfast  Angioedema - Resolved  Patient eating and breathing room air. Likely 2/2 ACEi vs NSAIDs allergic rx.  ACEi hold - ? If [facility] needed  Can d/c telemetry and continues pulse ox  DVT prophylaxis: SCV Code Status: Full  Family Communication: Daughter at bedside, all questions answered Disposition Plan:  Per Ortho   Objective: Vitals:   10/03/15 2029 10/04/15 0500 10/04/15 2005 10/05/15 0609  BP: 131/65 (!) 148/65 (!) 109/39 (!) 114/54  Pulse: 96 98 81 91  Resp: 20 20 20 18   Temp: 99.9 F (37.7 C) 99.1 F (37.3 C) 98.5 F (36.9 C) 98.7 F (37.1 C)  TempSrc: Oral Oral Oral Oral  SpO2: 93% 98% 95% 93%  Weight:      Height:        Intake/Output Summary (Last 24 hours) at 10/05/15 1215 Last data filed at 10/04/15 2000  Gross per 24 hour  Intake              785 ml  Output                0 ml  Net              785 ml   Filed Weights   10/01/15 1629  Weight: 86.8 kg (  191 lb 5.8 oz)    Examination:   General:  Afebrile, comfortable  Cardiovascular: S1-S2 RRR.   Respiratory: CTA bilaterally.   Abdomen: BS positive, soft, nontender.   Skin: Dressing and wound VAC in place.   Musculoskeletal: No clubbing or cyanosis.   Neurologic: Awake, alert, oriented 3, moves all extremities, but unable to fully evaluate due to back pain   Data Reviewed: I have personally reviewed following labs and imaging studies  CBC:  Recent Labs Lab 10/01/15 1711 10/03/15 0816 10/04/15 0552 10/05/15 0648  WBC 13.0* 7.4 7.9 6.8  NEUTROABS 9.6*  --   --   --   HGB 9.6* 7.7* 7.5* 7.5*   HCT 29.3* 24.8* 24.5* 24.6*  MCV 80.9 80.0 80.9 80.4  PLT 534* 417* 478* [id number]*   Basic Metabolic Panel:  Recent Labs Lab 10/01/15 1711 10/01/15 2044  NA 133* 135  K 6.3* 3.9  CL 97* 99*  CO2 25 27  GLUCOSE 142* 164*  BUN 11 11  CREATININE 1.07* 1.00  CALCIUM 9.4 9.0  MG  --  1.3*  PHOS  --  4.7*   GFR: Estimated Creatinine Clearance: 50.7 mL/min (by C-G formula based on SCr of 1 mg/dL). Liver Function Tests:  Recent Labs Lab 10/01/15 2044  AST 18  ALT 12*  ALKPHOS 77  BILITOT 0.5  PROT 7.4  ALBUMIN 2.8*   No results for input(s): LIPASE, AMYLASE in the last 168 hours. No results for input(s): AMMONIA in the last 168 hours. Coagulation Profile:  Recent Labs Lab 10/01/15 1711  INR 1.18   Cardiac Enzymes: No results for input(s): CKTOTAL, CKMB, CKMBINDEX, TROPONINI in the last 168 hours. BNP (last 3 results) No results for input(s): PROBNP in the last 8760 hours. HbA1C: No results for input(s): HGBA1C in the last 72 hours. CBG:  Recent Labs Lab 10/04/15 0802 10/04/15 1228 10/04/15 1651 10/04/15 2137 10/05/15 0743  GLUCAP 130* 174* 109* 218* 122*   Lipid Profile: No results for input(s): CHOL, HDL, LDLCALC, TRIG, CHOLHDL, LDLDIRECT in the last 72 hours. Thyroid Function Tests: No results for input(s): TSH, T4TOTAL, FREET4, T3FREE, THYROIDAB in the last 72 hours. Anemia Panel: No results for input(s): VITAMINB12, FOLATE, FERRITIN, TIBC, IRON, RETICCTPCT in the last 72 hours. Sepsis Labs: No results for input(s): PROCALCITON, LATICACIDVEN in the last 168 hours.  Recent Results (from the past 240 hour(s))  Surgical pcr screen     Status: None   Collection Time: 10/01/15  4:56 PM  Result Value Ref Range Status   MRSA, PCR NEGATIVE NEGATIVE Final   Staphylococcus aureus NEGATIVE NEGATIVE Final    Comment:        The Xpert SA Assay (FDA approved for NASAL specimens in patients over 73 years of age), is one component of a comprehensive  surveillance program.  Test performance has been validated by [facility] for patients greater than or equal to 37 year old. It is not intended to diagnose infection nor to guide or monitor treatment.   Aerobic/Anaerobic Culture (surgical/deep wound)     Status: None (Preliminary result)   Collection Time: 10/01/15  5:44 PM  Result Value Ref Range Status   Specimen Description WOUND  Final   Special Requests RIGHT LUMBAR SPEC A  Final   Gram Stain NO WBC SEEN NO ORGANISMS SEEN   Final   Culture   Final    RARE ESCHERICHIA COLI Confirmed Extended Spectrum Beta-Lactamase Producer (ESBL) NO ANAEROBES ISOLATED; CULTURE IN PROGRESS FOR 5 DAYS  Report Status PENDING  Incomplete   Organism ID, Bacteria ESCHERICHIA COLI  Final      Susceptibility   Escherichia coli - MIC*    AMPICILLIN >=32 RESISTANT Resistant     CEFAZOLIN >=64 RESISTANT Resistant     CEFEPIME 8 RESISTANT Resistant     CEFTAZIDIME <=1 RESISTANT Resistant     CEFTRIAXONE >=64 RESISTANT Resistant     CIPROFLOXACIN <=0.25 SENSITIVE Sensitive     GENTAMICIN <=1 SENSITIVE Sensitive     IMIPENEM <=0.25 SENSITIVE Sensitive     TRIMETH/SULFA >=320 RESISTANT Resistant     AMPICILLIN/SULBACTAM >=32 RESISTANT Resistant     PIP/TAZO <=4 SENSITIVE Sensitive     Extended ESBL POSITIVE Resistant     * RARE ESCHERICHIA COLI  Aerobic/Anaerobic Culture (surgical/deep wound)     Status: None (Preliminary result)   Collection Time: 10/01/15  5:51 PM  Result Value Ref Range Status   Specimen Description WOUND  Final   Special Requests LEFT LUMBAR SPEC B  Final   Gram Stain NO WBC SEEN NO ORGANISMS SEEN   Final   Culture   Final    NO GROWTH 2 DAYS NO ANAEROBES ISOLATED; CULTURE IN PROGRESS FOR 5 DAYS   Report Status PENDING  Incomplete     Radiology Studies: No results found.   Scheduled Meds: . allopurinol  200 mg Oral Daily  . amLODipine  10 mg Oral q morning - 10a  . ertapenem  1 g Intravenous Q24H  . ferrous  sulfate  325 mg Oral TID WC  . furosemide  20 mg Oral Daily  . glipiZIDE  5 mg Oral QAC breakfast  . insulin aspart  0-15 Units Subcutaneous TID WC  . insulin aspart  0-5 Units Subcutaneous QHS  . lubiprostone  24 mcg Oral BID WC  . magnesium citrate  0.5 Bottle Oral Once  . metoprolol  100 mg Oral q morning - 10a  . oxyCODONE  15 mg Oral Q12H  . pantoprazole  40 mg Oral Daily   Continuous Infusions: . sodium chloride 10 mL/hr at 10/03/15 1231     LOS: 4 days   Chipper Oman, MD [facility] Pager [phone]  If 7PM-7AM, please contact night-coverage [email or web address] Password [email or web address] 10/05/2015, 12:15 PM

## 2015-10-06 ENCOUNTER — Inpatient Hospital Stay (HOSPITAL_COMMUNITY): Payer: Medicare Other

## 2015-10-06 LAB — AEROBIC/ANAEROBIC CULTURE W GRAM STAIN (SURGICAL/DEEP WOUND)
Culture: NO GROWTH
Gram Stain: NONE SEEN

## 2015-10-06 LAB — AEROBIC/ANAEROBIC CULTURE (SURGICAL/DEEP WOUND): GRAM STAIN: NONE SEEN

## 2015-10-06 LAB — CBC
HEMATOCRIT: 22.2 % — AB (ref 36.0–46.0)
Hemoglobin: 7 g/dL — ABNORMAL LOW (ref 12.0–15.0)
MCH: 24.7 pg — AB (ref 26.0–34.0)
MCHC: 31.5 g/dL (ref 30.0–36.0)
MCV: 78.4 fL (ref 78.0–100.0)
PLATELETS: 405 10*3/uL — AB (ref 150–400)
RBC: 2.83 MIL/uL — AB (ref 3.87–5.11)
RDW: 16.5 % — ABNORMAL HIGH (ref 11.5–15.5)
WBC: 6.1 10*3/uL (ref 4.0–10.5)

## 2015-10-06 LAB — GLUCOSE, CAPILLARY
GLUCOSE-CAPILLARY: 161 mg/dL — AB (ref 65–99)
GLUCOSE-CAPILLARY: 226 mg/dL — AB (ref 65–99)
GLUCOSE-CAPILLARY: 82 mg/dL (ref 65–99)
Glucose-Capillary: 121 mg/dL — ABNORMAL HIGH (ref 65–99)

## 2015-10-06 MED ORDER — HYDROMORPHONE HCL 1 MG/ML IJ SOLN
INTRAMUSCULAR | Status: AC
  Filled 2015-10-06: qty 1

## 2015-10-06 MED ORDER — HYDROMORPHONE HCL 1 MG/ML IJ SOLN
1.0000 mg | INTRAMUSCULAR | Status: AC
  Administered 2015-10-06: 1 mg via INTRAVENOUS

## 2015-10-06 MED ORDER — OXYCODONE-ACETAMINOPHEN 5-325 MG PO TABS
1.0000 | ORAL_TABLET | ORAL | Status: DC | PRN
  Administered 2015-10-06 – 2015-10-08 (×8): 2 via ORAL
  Filled 2015-10-06 (×9): qty 2

## 2015-10-06 NOTE — Progress Notes (Signed)
CONSULT PROGRESS NOTE [facility]   TERYN ICHIKAWA   [id number] DOB: 1940/10/30  DOA: 10/01/2015 PCP: Antony Blackbird, MD   Brief Narrative:   75 year old female with a past medical history of GERD, PUD, diastolic CHF, pulmonary hypertension, essential hypertension, gout, hyperlipidemia, type 2 diabetes, osteoarthritis, osteopenia who is 2 weeks s/p TLIF. aAdmitted with healing complications with drainage and dehiscence of wound. She just underwent a formal I&D and application of wound VAC by Dr. Rolena Infante and we are being consulted for medical management. Patient on IV abx, ID consulted. Wound cultures found to be E Coli ESBL +, patient started on Invanz.     Subjective: Patient seen and examined today with daughter at bedside. Pain improving Dr. Rolena Infante at bedside cleaning wound. She denies fever, chest pain, dyspnea, palpitations, abdominal pain, nausea, emesis, diarrhea or constipation. PICC line placed yesterday.    Assessment & Plan:  Wound infection ([place]) - E Coli - ESBL  Continue treatment and postop care per Dr. Rolena Infante. ID recommendations appreciated  Continue ertapenem  Contact precautions   Back pain - Severe 2/2 orthopedic procedure - well control now  Pain control adjusted to OxyContin - will give longer relieve, and will continue Dilaudid for breakthrough.   Delirium - Resolved, today alert and oriented although in severe pain.  This could had been pain related vs opioid induced. Is important to keep pain well control, will help with recovery, also there is a fine line between over treating pain which can cause this side effects.   Essential hypertension, benign - BP stable  Lisinopril was held due to angioedema - last night which has resolved.  Continue amlodipine 10 mg by mouth daily. Continue metoprolol 100 mg by mouth daily. Continue furosemide 20 mg by mouth daily. Monitor blood pressure periodically.  Diastolic dysfunction Continue furosemide and beta  blocker. Lisinopril held due to angioedema.  Gout - Stable  Continue colchicine 0.6 milligram by mouth daily. Continue allopurinol 200 mg by mouth daily.  Hyperlipidemia - stable  Follow-up with PCP.  Type 2 diabetes mellitus (HCC) - stable Carbohydrate modified diet. Continue glipizide 5 mg by mouth daily before breakfast  Angioedema - Resolved  Patient eating and breathing room air. Likely 2/2 ACEi vs NSAIDs allergic rx.  ACEi hold - ? If [facility] needed  Can d/c telemetry and continues pulse ox  DVT prophylaxis: SCV Code Status: Full  Family Communication: Daughter at bedside, all questions answered Disposition Plan:  Per Ortho, From the medical stand point patient is medically stable. Will need long term antibiotics and STR.    Objective: Vitals:   10/05/15 0609 10/05/15 1443 10/05/15 2237 10/06/15 0636  BP: (!) 114/54 (!) 110/49 (!) 142/63 (!) 145/60  Pulse: 91 83 (!) 104 95  Resp: 18 17 16 18   Temp: 98.7 F (37.1 C) 99 F (37.2 C) 98.8 F (37.1 C) 98.5 F (36.9 C)  TempSrc: Oral Oral Oral Oral  SpO2: 93% 98% 96% 98%  Weight:      Height:        Intake/Output Summary (Last 24 hours) at 10/06/15 1320 Last data filed at 10/06/15 0500  Gross per 24 hour  Intake          1563.35 ml  Output              800 ml  Net           763.35 ml   Filed Weights   10/01/15 1629  Weight: 86.8 kg (191 lb  5.8 oz)    Examination:   General:  Afebrile, comfortable  Cardiovascular: S1-S2 RRR.   Respiratory: CTA bilaterally.   Abdomen: BS positive, soft, nontender.   Skin: Dressing and wound VAC in place.   Musculoskeletal: No clubbing or cyanosis.   Neurologic: Awake, alert, oriented 3, moves all extremities, but unable to fully evaluate due to back pain   Data Reviewed: I have personally reviewed following labs and imaging studies  CBC:  Recent Labs Lab 10/01/15 1711 10/03/15 0816 10/04/15 0552 10/05/15 0648 10/06/15 0500  WBC 13.0* 7.4 7.9 6.8 6.1    NEUTROABS 9.6*  --   --   --   --   HGB 9.6* 7.7* 7.5* 7.5* 7.0*  HCT 29.3* 24.8* 24.5* 24.6* 22.2*  MCV 80.9 80.0 80.9 80.4 78.4  PLT 534* 417* 478* 465* 123456*   Basic Metabolic Panel:  Recent Labs Lab 10/01/15 1711 10/01/15 2044  NA 133* 135  K 6.3* 3.9  CL 97* 99*  CO2 25 27  GLUCOSE 142* 164*  BUN 11 11  CREATININE 1.07* 1.00  CALCIUM 9.4 9.0  MG  --  1.3*  PHOS  --  4.7*   GFR: Estimated Creatinine Clearance: 50.7 mL/min (by C-G formula based on SCr of 1 mg/dL). Liver Function Tests:  Recent Labs Lab 10/01/15 2044  AST 18  ALT 12*  ALKPHOS 77  BILITOT 0.5  PROT 7.4  ALBUMIN 2.8*   No results for input(s): LIPASE, AMYLASE in the last 168 hours. No results for input(s): AMMONIA in the last 168 hours. Coagulation Profile:  Recent Labs Lab 10/01/15 1711  INR 1.18   Cardiac Enzymes: No results for input(s): CKTOTAL, CKMB, CKMBINDEX, TROPONINI in the last 168 hours. BNP (last 3 results) No results for input(s): PROBNP in the last 8760 hours. HbA1C: No results for input(s): HGBA1C in the last 72 hours. CBG:  Recent Labs Lab 10/05/15 1223 10/05/15 1656 10/05/15 2202 10/06/15 0805 10/06/15 1214  GLUCAP 203* 85 181* 161* 226*   Lipid Profile: No results for input(s): CHOL, HDL, LDLCALC, TRIG, CHOLHDL, LDLDIRECT in the last 72 hours. Thyroid Function Tests: No results for input(s): TSH, T4TOTAL, FREET4, T3FREE, THYROIDAB in the last 72 hours. Anemia Panel: No results for input(s): VITAMINB12, FOLATE, FERRITIN, TIBC, IRON, RETICCTPCT in the last 72 hours. Sepsis Labs: No results for input(s): PROCALCITON, LATICACIDVEN in the last 168 hours.  Recent Results (from the past 240 hour(s))  Surgical pcr screen     Status: None   Collection Time: 10/01/15  4:56 PM  Result Value Ref Range Status   MRSA, PCR NEGATIVE NEGATIVE Final   Staphylococcus aureus NEGATIVE NEGATIVE Final    Comment:        The Xpert SA Assay (FDA approved for NASAL  specimens in patients over 7 years of age), is one component of a comprehensive surveillance program.  Test performance has been validated by [facility] for patients greater than or equal to 42 year old. It is not intended to diagnose infection nor to guide or monitor treatment.   Aerobic/Anaerobic Culture (surgical/deep wound)     Status: None (Preliminary result)   Collection Time: 10/01/15  5:44 PM  Result Value Ref Range Status   Specimen Description WOUND  Final   Special Requests RIGHT LUMBAR SPEC A  Final   Gram Stain NO WBC SEEN NO ORGANISMS SEEN   Final   Culture   Final    RARE ESCHERICHIA COLI Confirmed Extended Spectrum Beta-Lactamase Producer (ESBL) NO  ANAEROBES ISOLATED; CULTURE IN PROGRESS FOR 5 DAYS    Report Status PENDING  Incomplete   Organism ID, Bacteria ESCHERICHIA COLI  Final      Susceptibility   Escherichia coli - MIC*    AMPICILLIN >=32 RESISTANT Resistant     CEFAZOLIN >=64 RESISTANT Resistant     CEFEPIME 8 RESISTANT Resistant     CEFTAZIDIME <=1 RESISTANT Resistant     CEFTRIAXONE >=64 RESISTANT Resistant     CIPROFLOXACIN <=0.25 SENSITIVE Sensitive     GENTAMICIN <=1 SENSITIVE Sensitive     IMIPENEM <=0.25 SENSITIVE Sensitive     TRIMETH/SULFA >=320 RESISTANT Resistant     AMPICILLIN/SULBACTAM >=32 RESISTANT Resistant     PIP/TAZO <=4 SENSITIVE Sensitive     Extended ESBL POSITIVE Resistant     * RARE ESCHERICHIA COLI  Aerobic/Anaerobic Culture (surgical/deep wound)     Status: None (Preliminary result)   Collection Time: 10/01/15  5:51 PM  Result Value Ref Range Status   Specimen Description WOUND  Final   Special Requests LEFT LUMBAR SPEC B  Final   Gram Stain NO WBC SEEN NO ORGANISMS SEEN   Final   Culture   Final    NO GROWTH 2 DAYS NO ANAEROBES ISOLATED; CULTURE IN PROGRESS FOR 5 DAYS   Report Status PENDING  Incomplete     Radiology Studies: No results found.   Scheduled Meds: . allopurinol  200 mg Oral Daily  .  amLODipine  10 mg Oral q morning - 10a  . ertapenem  1 g Intravenous Q24H  . ferrous sulfate  325 mg Oral TID WC  . furosemide  20 mg Oral Daily  . glipiZIDE  5 mg Oral QAC breakfast  . HYDROmorphone      . insulin aspart  0-15 Units Subcutaneous TID WC  . insulin aspart  0-5 Units Subcutaneous QHS  . lubiprostone  24 mcg Oral BID WC  . magnesium citrate  0.5 Bottle Oral Once  . metoprolol  100 mg Oral q morning - 10a  . pantoprazole  40 mg Oral Daily  . sodium chloride flush  10-40 mL Intracatheter Q12H   Continuous Infusions: . sodium chloride 75 mL/hr at 10/06/15 0040     LOS: 5 days   Chipper Oman, MD [facility] Pager [phone]  If 7PM-7AM, please contact night-coverage [email or web address] Password [email or web address] 10/06/2015, 1:20 PM

## 2015-10-06 NOTE — Progress Notes (Signed)
Physical Therapy Treatment Patient Details Name: Nancy [name] MRN: [id number] DOB: 24-Sep-1940 Today's Date: 10/06/2015    History of Present Illness Patient is a 75 y/o female admitted with surgical wound infection.  hx of DM, HTN, HLD, diastolic dysfunction, s/p L4-5 TLIF, posterior fusion L5-S1 on 9/6.    PT Comments    The patient's mobility remains very slow and guarded throughout session. Consistent encouragement needed for pt participation as well as repeated cues for safety and sequencing. Pt reports having Lt knee pain but she is not sure when it started but does state that she has had pain in this areas before. Pt able to attempt ambulation but distance limited by the patient's reports of fatigue and pain. Overall, continuing to recommend SNF for further rehabilitation following acute stay. Patient's daughter present and supportive throughout session.   Follow Up Recommendations  SNF;Supervision/Assistance - 24 hour     Equipment Recommendations  None recommended by PT    Recommendations for Other Services       Precautions / Restrictions Precautions Precautions: Back;Fall Required Braces or Orthoses: Spinal Brace Spinal Brace: Lumbar corset;Applied in sitting position Restrictions Weight Bearing Restrictions: No    Mobility  Bed Mobility               General bed mobility comments: pt sitting on BSC upon arrvial  Transfers Overall transfer level: Needs assistance Equipment used: Rolling walker (2 wheeled) Transfers: Sit to/from Stand Sit to Stand: Min assist         General transfer comment: very slow transition to standing, needing cues for hand placement and assist to stand from [facility].   Ambulation/Gait Ambulation/Gait assistance: Min assist Ambulation Distance (Feet): 6 Feet Assistive device: Rolling walker (2 wheeled) Gait Pattern/deviations: Step-to pattern;Trunk flexed Gait velocity: decreased   General Gait Details: Pt reports having Lt  knee pain throughout session. Pt able to bear weight through LLE without buckling or noted instability or observed signs of increased pain. Heavy encouragement needed to attempt mobility throughout session.    Stairs            Wheelchair Mobility    Modified Rankin (Stroke Patients Only)       Balance Overall balance assessment: Needs assistance Sitting-balance support: No upper extremity supported Sitting balance-Leahy Scale: Fair     Standing balance support: Bilateral upper extremity supported Standing balance-Leahy Scale: Poor Standing balance comment: using rw for support                    Cognition Arousal/Alertness: Awake/alert Behavior During Therapy: Anxious Overall Cognitive Status: Impaired/Different from baseline Area of Impairment: Safety/judgement                    Exercises      General Comments General comments (skin integrity, edema, etc.): Pt indicates having incresed Lt knee pain with an insidious onset. Pt able to perform active knee extension but pt does report having pain, no noted erythema or swelling noted. No pain reproduced with knee flexion.       Pertinent Vitals/Pain Pain Assessment: Faces Faces Pain Scale: Hurts even more Pain Location: Lt knee, back Pain Descriptors / Indicators: Grimacing;Guarding (hurts) Pain Intervention(s): Limited activity within patient's tolerance;Monitored during session    Home Living                      Prior Function            PT Goals (current  goals can now be found in the care plan section) Acute Rehab PT Goals Patient Stated Goal: not expressed PT Goal Formulation: With patient Time For Goal Achievement: 10/09/15 Potential to Achieve Goals: Good Progress towards PT goals: Progressing toward goals    Frequency    Min 5X/week      PT Plan Current plan remains appropriate    Co-evaluation             End of Session Equipment Utilized During Treatment:  Back brace;Gait belt Activity Tolerance: Patient limited by pain Patient left: in chair;with call bell/phone within reach;with family/visitor present     Time: 0917-0949 PT Time Calculation (min) (ACUTE ONLY): 32 min  Charges:  $Therapeutic Activity: 23-37 mins                    G Codes:      Cassell Clement, PT, CSCS Pager [phone] Office [phone]  10/06/2015, 10:28 AM

## 2015-10-06 NOTE — Progress Notes (Signed)
    Subjective: Procedure(s) (LRB): LUMBAR WOUND DEBRIDEMENT (N/A) 5 Days Post-Op  Patient reports pain as 5 on 0-10 scale.  Reports decreased leg pain reports incisional back pain   Positive void Negative bowel movement Positive flatus Negative chest pain or shortness of breath  Objective: Vital signs in last 24 hours: Temp:  [98.5 F (36.9 C)-99 F (37.2 C)] 98.5 F (36.9 C) (09/25 0636) Pulse Rate:  [83-104] 95 (09/25 0636) Resp:  [16-18] 18 (09/25 0636) BP: (110-145)/(49-63) 145/60 (09/25 0636) SpO2:  [96 %-98 %] 98 % (09/25 0636)  Intake/Output from previous day: 09/24 0701 - 09/25 0700 In: 1803.4 [P.O.:600; I.V.:1153.4; IV Piggyback:50] Out: 800 [Urine:800]  Labs:  Recent Labs  10/05/15 0648 10/06/15 0500  WBC 6.8 6.1  RBC 3.06* 2.83*  HCT 24.6* 22.2*  PLT 465* 405*   No results for input(s): NA, K, CL, CO2, BUN, CREATININE, GLUCOSE, CALCIUM in the last 72 hours. No results for input(s): LABPT, INR in the last 72 hours.  Physical Exam: Neurologically intact Intact pulses distally Incision: no drainage Compartment soft  Assessment/Plan: Patient stable  xrays n/a Continue mobilization with physical therapy Continue care  1. VAC changed today.  No purulent drainage, no erythema 2. Sacral breakdown improving 3. Plan: SNF d/c tomorrow/wednesday Will order CT scan L/S spine to evaluate hardware Continue IV abx DM stable Iron supplement for anemia - chronic anemia with acute blood loss anemia secondary to primary spinal surgery  Melina Schools, MD [facility] [phone]

## 2015-10-07 DIAGNOSIS — I10 Essential (primary) hypertension: Secondary | ICD-10-CM

## 2015-10-07 DIAGNOSIS — E1142 Type 2 diabetes mellitus with diabetic polyneuropathy: Secondary | ICD-10-CM

## 2015-10-07 DIAGNOSIS — E1165 Type 2 diabetes mellitus with hyperglycemia: Secondary | ICD-10-CM

## 2015-10-07 DIAGNOSIS — M545 Low back pain: Secondary | ICD-10-CM

## 2015-10-07 DIAGNOSIS — T783XXA Angioneurotic edema, initial encounter: Secondary | ICD-10-CM

## 2015-10-07 DIAGNOSIS — A498 Other bacterial infections of unspecified site: Secondary | ICD-10-CM

## 2015-10-07 DIAGNOSIS — M109 Gout, unspecified: Secondary | ICD-10-CM

## 2015-10-07 DIAGNOSIS — T849XXA Unspecified complication of internal orthopedic prosthetic device, implant and graft, initial encounter: Secondary | ICD-10-CM

## 2015-10-07 DIAGNOSIS — Z981 Arthrodesis status: Secondary | ICD-10-CM

## 2015-10-07 DIAGNOSIS — E785 Hyperlipidemia, unspecified: Secondary | ICD-10-CM

## 2015-10-07 LAB — PREPARE RBC (CROSSMATCH)

## 2015-10-07 LAB — GLUCOSE, CAPILLARY
GLUCOSE-CAPILLARY: 68 mg/dL (ref 65–99)
GLUCOSE-CAPILLARY: 72 mg/dL (ref 65–99)
Glucose-Capillary: 103 mg/dL — ABNORMAL HIGH (ref 65–99)
Glucose-Capillary: 97 mg/dL (ref 65–99)

## 2015-10-07 MED ORDER — METHOCARBAMOL 500 MG PO TABS: 500.0000 mg | ORAL_TABLET | Freq: Three times a day (TID) | ORAL | 0 refills | Status: DC

## 2015-10-07 MED ORDER — MAGNESIUM CITRATE PO SOLN
0.5000 | Freq: Once | ORAL | Status: AC
  Administered 2015-10-07: 0.5 via ORAL
  Filled 2015-10-07: qty 296

## 2015-10-07 MED ORDER — FERROUS SULFATE 325 (65 FE) MG PO TABS: 325.0000 mg | ORAL_TABLET | Freq: Three times a day (TID) | ORAL | 0 refills | Status: DC

## 2015-10-07 MED ORDER — SODIUM CHLORIDE 0.9 % IV SOLN
Freq: Once | INTRAVENOUS | Status: DC
Start: 2015-10-07 — End: 2015-10-08

## 2015-10-07 MED ORDER — ONDANSETRON HCL 4 MG/2ML IJ SOLN: 4.0000 mg | INTRAMUSCULAR | 0 refills | Status: DC | PRN

## 2015-10-07 MED ORDER — INSULIN ASPART 100 UNIT/ML ~~LOC~~ SOLN: 0.0000 [IU] | Freq: Three times a day (TID) | SUBCUTANEOUS | 11 refills | Status: DC

## 2015-10-07 MED ORDER — INSULIN ASPART 100 UNIT/ML ~~LOC~~ SOLN: 0.0000 [IU] | Freq: Every day | SUBCUTANEOUS | 11 refills | Status: DC

## 2015-10-07 MED ORDER — OXYCODONE-ACETAMINOPHEN 5-325 MG PO TABS: 1.0000 | ORAL_TABLET | ORAL | 0 refills | Status: DC | PRN

## 2015-10-07 MED ORDER — FLEET ENEMA 7-19 GM/118ML RE ENEM
1.0000 | ENEMA | Freq: Once | RECTAL | Status: AC
  Administered 2015-10-07: 1 via RECTAL
  Filled 2015-10-07: qty 1

## 2015-10-07 MED ORDER — COLCHICINE 0.6 MG PO TABS
0.6000 mg | ORAL_TABLET | Freq: Every day | ORAL | Status: DC
  Administered 2015-10-08: 0.6 mg via ORAL
  Filled 2015-10-07: qty 1

## 2015-10-07 NOTE — Progress Notes (Signed)
Physical Therapy Treatment Patient Details Name: Nancy [name] MRN: [id number] DOB: January 09, 1941 Today's Date: 10/07/2015    History of Present Illness Patient is a 75 y/o female admitted with surgical wound infection.  hx of DM, HTN, HLD, diastolic dysfunction, s/p L4-5 TLIF, posterior fusion L5-S1 on 9/6.    PT Comments    Pt progressing slowly with mobility during PT session, requiring extensive time for bed mobility and transfers. Pt ambulating 10 ft with rw and encouragement for increasing ambulation distance. Based upon the patient's current mobility level, continuing to recommend SNF following acute stay.   Follow Up Recommendations  SNF;Supervision/Assistance - 24 hour     Equipment Recommendations  None recommended by PT    Recommendations for Other Services       Precautions / Restrictions Precautions Precautions: Back;Fall Required Braces or Orthoses: Spinal Brace Spinal Brace: Lumbar corset;Applied in sitting position Restrictions Weight Bearing Restrictions: No    Mobility  Bed Mobility Overal bed mobility: Needs Assistance Bed Mobility: Rolling;Sidelying to Sit Rolling: Mod assist Sidelying to sit: Mod assist       General bed mobility comments: encouraging pt to assist with UEs. Reviewing logroll technique  Transfers Overall transfer level: Needs assistance Equipment used: Rolling walker (2 wheeled) Transfers: Sit to/from Stand Sit to Stand: Min assist Stand pivot transfers: Min assist       General transfer comment: cues needed for sequence, assist needed with moving rw as well as hand placement.  Pivot from bed to [facility] and ambulating with chair following.   Ambulation/Gait Ambulation/Gait assistance: Min assist Ambulation Distance (Feet): 10 Feet Assistive device: Rolling walker (2 wheeled) Gait Pattern/deviations: Step-through pattern;Decreased step length - right;Decreased step length - left Gait velocity: decreased   General Gait  Details: cues for posture, assist with maneuvering rw.    Stairs            Wheelchair Mobility    Modified Rankin (Stroke Patients Only)       Balance Overall balance assessment: Needs assistance Sitting-balance support: No upper extremity supported Sitting balance-Leahy Scale: Fair     Standing balance support: Bilateral upper extremity supported Standing balance-Leahy Scale: Poor Standing balance comment: using rw for support                    Cognition Arousal/Alertness: Awake/alert Behavior During Therapy: WFL for tasks assessed/performed Overall Cognitive Status: Impaired/Different from baseline Area of Impairment: Safety/judgement                    Exercises      General Comments General comments (skin integrity, edema, etc.): Assist needed with donning brace.       Pertinent Vitals/Pain Pain Assessment: Faces Faces Pain Scale: Hurts even more Pain Location: Lt foot Pain Descriptors / Indicators: Grimacing;Guarding Pain Intervention(s): Monitored during session;Limited activity within patient's tolerance    Home Living                      Prior Function            PT Goals (current goals can now be found in the care plan section) Acute Rehab PT Goals Patient Stated Goal: get up to the commode PT Goal Formulation: With patient Time For Goal Achievement: 10/09/15 Potential to Achieve Goals: Good Progress towards PT goals: Progressing toward goals    Frequency    Min 5X/week      PT Plan Current plan remains appropriate    Co-evaluation  End of Session Equipment Utilized During Treatment: Back brace;Gait belt Activity Tolerance: Patient limited by fatigue Patient left: in chair;with call bell/phone within reach;with family/visitor present     Time: 1230-1315 PT Time Calculation (min) (ACUTE ONLY): 45 min  Charges:  $Gait Training: 8-22 mins $Therapeutic Activity: 23-37 mins                     G Codes:      Cassell Clement, PT, CSCS Pager [phone] Office [phone]  10/07/2015, 2:20 PM

## 2015-10-07 NOTE — Progress Notes (Signed)
Discharge to [facility] place cancelled tonight. Will aim to discharge tomorrow

## 2015-10-07 NOTE — Consult Note (Signed)
Medical Consultation   Nancy [name]  [id number]  DOB: Oct 30, 1940  DOA: 10/01/2015  PCP: Antony Blackbird, MD    Requesting physician: Dr.Dahari Rolena Infante Orthopedic surgery  Reason for consultation: Medical management   History of Present Illness: 75 year old female PMHx GERD, PUD, Diastolic CHF, Pulmonary hypertension, HTN,  Gout, HLD, DM Type 2 with complication, OA, Osteopenia who is 2 weeks S/P Transforaminal Lumbar interbody fusion(TLIF). Patient was discharged on 9/9 to home.   Admitted 9/20 with healing complications with drainage and dehiscence of wound. She just underwent a formal I&D and application of wound VAC by Dr. Rolena Infante and we are being consulted for medical management. Patient on IV abx, ID consulted. Wound cultures found to be E Coli ESBL +, patient started on Invanz.          Review of Systems:  Review of Systems  Constitutional: Negative.   HENT: Negative.   Eyes: Negative.   Respiratory: Negative.   Cardiovascular: Negative.   Gastrointestinal: Positive for abdominal pain. Negative for blood in stool, constipation, diarrhea, melena, nausea and vomiting.  Genitourinary: Negative.   Musculoskeletal: Negative.   Skin: Negative.   Neurological: Negative.   Psychiatric/Behavioral: Negative.       Past Medical History: Past Medical History:  Diagnosis Date  . Breast cyst    left breast  . Chest pain   . Complication of anesthesia 1986   slow to awaken after cholecystectomy  . Diastolic dysfunction    with mild pulmonary HTN by echo 07/2011  . Gastric ulcer   . GERD (gastroesophageal reflux disease)   . Gout   . Hyperlipidemia   . Hypertension   . Osteoarthritis   . Osteopenia   . Shortness of breath dyspnea    with activity  . Spine deformity    lower spine  . Type II diabetes mellitus ([place])     Past Surgical History: Past Surgical History:  Procedure Laterality Date  . ABDOMINAL ADHESION SURGERY    . ABDOMINAL  HYSTERECTOMY    . ANUS SURGERY     for torn tissue  . APPENDECTOMY    . BREAST BIOPSY    . BREAST LUMPECTOMY     left  . CHOLECYSTECTOMY  1986  . COLONOSCOPY    . ESOPHAGOGASTRODUODENOSCOPY (EGD) WITH PROPOFOL N/A 07/18/2013   Procedure: ESOPHAGOGASTRODUODENOSCOPY (EGD) WITH PROPOFOL;  Surgeon: Arta Silence, MD;  Location: [facility];  Service: Endoscopy;  Laterality: N/A;  . EUS N/A 07/18/2013   Procedure: ESOPHAGEAL ENDOSCOPIC ULTRASOUND (EUS) RADIAL;  Surgeon: Arta Silence, MD;  Location: [facility];  Service: Endoscopy;  Laterality: N/A;  . LUMBAR WOUND DEBRIDEMENT N/A 10/01/2015   Procedure: LUMBAR WOUND DEBRIDEMENT;  Surgeon: Melina Schools, MD;  Location: [place];  Service: Orthopedics;  Laterality: N/A;  . MASS EXCISION Left 07/24/2015   Procedure: EXCISION MASS LEFT LONG FINGER;  Surgeon: Leanora Cover, MD;  Location: [place];  Service: Orthopedics;  Laterality: Left;  . SPINAL FUSION N/A 09/17/2015   Procedure: FUSION POSTERIOR SPINAL MULTILEVEL L4- S1;  Surgeon: Melina Schools, MD;  Location: [place];  Service: Orthopedics;  Laterality: N/A;  . TRANSFORAMINAL LUMBAR INTERBODY FUSION (TLIF) WITH PEDICLE SCREW FIXATION 1 LEVEL N/A 09/17/2015   Procedure: TRANSFORAMINAL LUMBAR INTERBODY FUSION (TLIF) WITH PEDICLE SCREW FIXATION 1 LEVEL , Lumbar 4-5;  Surgeon: Melina Schools, MD;  Location: [place];  Service: Orthopedics;  Laterality: N/A;     Allergies:  Allergies  Allergen Reactions  . Morphine And Related Swelling    10/02/15 - given Toradol + morphine and RN noted tongue swelling - given Epi + Benadryl Has had morphine in past w/o problem  . Toradol [Ketorolac Tromethamine] Swelling    10/02/15 - given Toradol + morphine and RN noted tongue swelling - given Epi + Benadryl Has taken ibuprofen in past w/o problem  . Codeine Other (See Comments)    headache     Social History:  reports that she has never smoked. She has never used smokeless tobacco. She reports  that she drinks alcohol. She reports that she does not use drugs.   Family History: Family History  Problem Relation Age of Onset  . Heart disease Mother   . Cancer Sister     ovarian     Procedures/Significant Events:  9/6:-Transforaminal lumbar interbody fusion, L4-L5. -Posterior arthrodesis L5-S1 with local bone and allograft DBX mix. -Posterior pedicle screw fixation, L5-S1. -Posterior fusion instrumentation L4-S1.  Cultures 9/20 negative MRSA by PCR 9/20 right lumbosacral wound positive ESBL    Antimicrobials: Ancef 9/20>> 9/21 Ceftriaxone 9/21>> 9/24 Invanz 9/24>> Zosyn 9/20>> 9/21 Vancomycin 9/20>> 9/23   Devices None   LINES / TUBES:      Continuous Infusions: . sodium chloride 75 mL/hr at 10/06/15 0040     Physical Exam: Vitals:   10/05/15 2237 10/06/15 0636 10/06/15 2152 10/07/15 0633  BP: (!) 142/63 (!) 145/60 138/64 (!) 120/53  Pulse: (!) 104 95 89 87  Resp: 16 18 18 17   Temp: 98.8 F (37.1 C) 98.5 F (36.9 C) 98.3 F (36.8 C) 98.3 F (36.8 C)  TempSrc: Oral Oral Oral Oral  SpO2: 96% 98% 97% 96%  Weight:      Height:        General: A/O 4, NAD, sitting in chair comfortably, No acute respiratory distress Eyes: negative scleral hemorrhage, negative anisocoria, negative icterus ENT: Negative Runny nose, negative gingival bleeding, Neck:  Negative scars, masses, torticollis, lymphadenopathy, JVD Lungs: Clear to auscultation bilaterally without wheezes or crackles Cardiovascular: Regular rate and rhythm without murmur gallop or rub normal S1 and S2 Abdomen: negative abdominal pain, nondistended, positive soft, bowel sounds, no rebound, no ascites, no appreciable mass Extremities: No significant cyanosis, clubbing, or edema bilateral lower extremities Skin: Negative rashes, lesions, ulcers Psychiatric:  Negative depression, negative anxiety, negative fatigue, negative mania  Central nervous system:  Cranial nerves II through XII intact,  tongue/uvula midline, all extremities muscle strength 5/5, sensation intact throughout, negative dysarthria, negative expressive aphasia, negative receptive aphasia.  Data reviewed:  I have personally reviewed following labs and imaging studies Labs:  CBC:  Recent Labs Lab 10/01/15 1711 10/03/15 0816 10/04/15 0552 10/05/15 0648 10/06/15 0500  WBC 13.0* 7.4 7.9 6.8 6.1  NEUTROABS 9.6*  --   --   --   --   HGB 9.6* 7.7* 7.5* 7.5* 7.0*  HCT 29.3* 24.8* 24.5* 24.6* 22.2*  MCV 80.9 80.0 80.9 80.4 78.4  PLT 534* 417* 478* 465* 405*    Basic Metabolic Panel:  Recent Labs Lab 10/01/15 1711 10/01/15 2044  NA 133* 135  K 6.3* 3.9  CL 97* 99*  CO2 25 27  GLUCOSE 142* 164*  BUN 11 11  CREATININE 1.07* 1.00  CALCIUM 9.4 9.0  MG  --  1.3*  PHOS  --  4.7*   GFR Estimated Creatinine Clearance: 50.7 mL/min (by C-G formula based on SCr of 1 mg/dL). Liver Function Tests:  Recent Labs Lab  10/01/15 2044  AST 18  ALT 12*  ALKPHOS 77  BILITOT 0.5  PROT 7.4  ALBUMIN 2.8*   No results for input(s): LIPASE, AMYLASE in the last 168 hours. No results for input(s): AMMONIA in the last 168 hours. Coagulation profile  Recent Labs Lab 10/01/15 1711  INR 1.18    Cardiac Enzymes: No results for input(s): CKTOTAL, CKMB, CKMBINDEX, TROPONINI in the last 168 hours. BNP: Invalid input(s): POCBNP CBG:  Recent Labs Lab 10/06/15 0805 10/06/15 1214 10/06/15 1611 10/06/15 2149 10/07/15 0755  GLUCAP 161* 226* 82 121* 68   D-Dimer No results for input(s): DDIMER in the last 72 hours. Hgb A1c No results for input(s): HGBA1C in the last 72 hours. Lipid Profile No results for input(s): CHOL, HDL, LDLCALC, TRIG, CHOLHDL, LDLDIRECT in the last 72 hours. Thyroid function studies No results for input(s): TSH, T4TOTAL, T3FREE, THYROIDAB in the last 72 hours.  Invalid input(s): FREET3 Anemia work up No results for input(s): VITAMINB12, FOLATE, FERRITIN, TIBC, IRON, RETICCTPCT in the  last 72 hours. Urinalysis    Component Value Date/Time   COLORURINE YELLOW 09/20/2015 [place] 09/20/2015 1338   LABSPEC 1.016 09/20/2015 1338   PHURINE 6.0 09/20/2015 1338   GLUCOSEU 100 (A) 09/20/2015 1338   HGBUR NEGATIVE 09/20/2015 1338   BILIRUBINUR NEGATIVE 09/20/2015 1338   KETONESUR 15 (A) 09/20/2015 1338   PROTEINUR NEGATIVE 09/20/2015 1338   UROBILINOGEN 1.0 12/10/2006 0637   NITRITE NEGATIVE 09/20/2015 1338   LEUKOCYTESUR NEGATIVE 09/20/2015 1338     Microbiology Recent Results (from the past 240 hour(s))  Surgical pcr screen     Status: None   Collection Time: 10/01/15  4:56 PM  Result Value Ref Range Status   MRSA, PCR NEGATIVE NEGATIVE Final   Staphylococcus aureus NEGATIVE NEGATIVE Final    Comment:        The Xpert SA Assay (FDA approved for NASAL specimens in patients over 24 years of age), is one component of a comprehensive surveillance program.  Test performance has been validated by [facility] for patients greater than or equal to 29 year old. It is not intended to diagnose infection nor to guide or monitor treatment.   Aerobic/Anaerobic Culture (surgical/deep wound)     Status: None   Collection Time: 10/01/15  5:44 PM  Result Value Ref Range Status   Specimen Description WOUND  Final   Special Requests RIGHT LUMBAR SPEC A  Final   Gram Stain NO WBC SEEN NO ORGANISMS SEEN   Final   Culture   Final    RARE ESCHERICHIA COLI Confirmed Extended Spectrum Beta-Lactamase Producer (ESBL) NO ANAEROBES ISOLATED    Report Status 10/06/2015 FINAL  Final   Organism ID, Bacteria ESCHERICHIA COLI  Final      Susceptibility   Escherichia coli - MIC*    AMPICILLIN >=32 RESISTANT Resistant     CEFAZOLIN >=64 RESISTANT Resistant     CEFEPIME 8 RESISTANT Resistant     CEFTAZIDIME <=1 RESISTANT Resistant     CEFTRIAXONE >=64 RESISTANT Resistant     CIPROFLOXACIN <=0.25 SENSITIVE Sensitive     GENTAMICIN <=1 SENSITIVE Sensitive      IMIPENEM <=0.25 SENSITIVE Sensitive     TRIMETH/SULFA >=320 RESISTANT Resistant     AMPICILLIN/SULBACTAM >=32 RESISTANT Resistant     PIP/TAZO <=4 SENSITIVE Sensitive     Extended ESBL POSITIVE Resistant     * RARE ESCHERICHIA COLI  Aerobic/Anaerobic Culture (surgical/deep wound)     Status: None  Collection Time: 10/01/15  5:51 PM  Result Value Ref Range Status   Specimen Description WOUND  Final   Special Requests LEFT LUMBAR SPEC B  Final   Gram Stain NO WBC SEEN NO ORGANISMS SEEN   Final   Culture No growth aerobically or anaerobically.  Final   Report Status 10/06/2015 FINAL  Final       Inpatient Medications:   Scheduled Meds: . allopurinol  200 mg Oral Daily  . amLODipine  10 mg Oral q morning - 10a  . ertapenem  1 g Intravenous Q24H  . ferrous sulfate  325 mg Oral TID WC  . furosemide  20 mg Oral Daily  . glipiZIDE  5 mg Oral QAC breakfast  . insulin aspart  0-15 Units Subcutaneous TID WC  . insulin aspart  0-5 Units Subcutaneous QHS  . lubiprostone  24 mcg Oral BID WC  . magnesium citrate  0.5 Bottle Oral Once  . metoprolol  100 mg Oral q morning - 10a  . pantoprazole  40 mg Oral Daily  . sodium chloride flush  10-40 mL Intracatheter Q12H   Continuous Infusions: . sodium chloride 75 mL/hr at 10/06/15 0040     Radiological Exams on Admission: Ct Lumbar Spine Wo Contrast  Result Date: 10/06/2015 CLINICAL DATA:  75 y/o  F; severe lumbar spinal pain after fusion. EXAM: CT LUMBAR SPINE WITHOUT CONTRAST TECHNIQUE: Multidetector CT imaging of the lumbar spine was performed without intravenous contrast administration. Multiplanar CT image reconstructions were also generated. COMPARISON:  09/18/2015 lumbar spine radiographs. CT abdomen and pelvis 12/14/2012. FINDINGS: Segmentation: 5 lumbar type vertebrae. Alignment: Lumbar lordosis is maintained. Grade 1 L4-5 anterolisthesis. Vertebrae: Status post posterior instrumented fusion of L4 through S1, left-sided  laminectomies, L4-5 foraminotomy and facetectomy. Hardware includes vertical rods fixed by 3 right-sided transpedicular screws and left-sided L4 and S1 transpedicular screws. There is and interbody fusion prosthesis at L4-5. There is a linear tract traversing the left L5 pedicle probably representing sequelae of instrumentation. Vertebral bodies of levels of fusion are partially obscured by streak artifact. Left-sided L5-S1 posterior element morselized bone graft material noted. Paraspinal and other soft tissues: There are open wounds within the lumbar subcutaneous fat consistent with history of recent debridement. There is edema throughout the surrounding soft tissues. Disc levels: Prominent disc bulge at the L3-4 level. Severe disc space narrowing at L5-S1 with endplate degenerative changes. IMPRESSION: 1. Posterior instrumented fusion of L4 through S1. Hardware is intact and no acute hardware related complication is identified. 2. Open wounds within the subcutaneous fat at the L2-3 level and edema throughout paraspinal muscles at levels of fusion. No large discrete fluid collection is identified on this noncontrast study, suboptimal assessment without intravenous contrast. Electronically Signed   By: Kristine Garbe M.D.   On: 10/06/2015 16:02    Impression/Recommendations Principal Problem:   Wound infection ([place]) Active Problems:   Essential hypertension, benign   Diastolic dysfunction   Back pain   Gout   Hyperlipidemia   Type 2 diabetes mellitus ([facility])   Angioedema   Infection due to ESBL-producing Escherichia coli  Wound infection positive E Coli - ESBL  -Continue treatment and postop care per Dr. Rolena Infante. -Continue antibiotics per ID    Back pain  -Resolved -Continue current pain regimen.    Delirium  - Resolved,   Essential hypertension,  -Amlodipine 10 mg daily -Lasix 20 mg daily -Metoprolol 100 mg daily -Lisinopril was held due to angioedema   Diastolic  dysfunction -See hypertension . -  9/26 transfuse 1 unit PRBC  -Transfuse for hemoglobin<8  Gout - Stable  -Continue colchicine 0.6 milligram by mouth daily. -Continue allopurinol 200 mg by mouth daily. -Uric acid pending. -Uric acid goal<6 mg/dL  Hyperlipidemia Follow-up with PCP.  Type 2 diabetes mellitus uncontrolled with complications -Carbohydrate modified diet. -Continue glipizide 5 mg by mouth daily before breakfast -Moderate SSI  Angioedema - Resolved  -Patient eating and breathing room air. Likely 2/2 ACEi vs NSAIDs allergic rx.  -ACEi hold - ? If [facility] needed       Thank you for this consultation.  [facility] hospitalist team will follow the patient with you.   Time Spent: 35 minutes  Larcenia Holaday, Geraldo Docker M.D. [facility] 10/07/2015, 9:05 AM

## 2015-10-07 NOTE — Progress Notes (Signed)
Pt has discharge orders to [facility] but the discharge med script has not been signed. Pt also has a wound but there are no instructions as to what happens when pt is discharged except for the dressing changes to be done in doctor Brooks's office. [place] office paged

## 2015-10-07 NOTE — Progress Notes (Signed)
Inpatient Diabetes Program Recommendations  AACE/ADA: New Consensus Statement on Inpatient Glycemic Control (2015)  Target Ranges:  Prepandial:   less than 140 mg/dL      Peak postprandial:   less than 180 mg/dL (1-2 hours)      Critically ill patients:  140 - 180 mg/dL   Lab Results  Component Value Date   GLUCAP 103 (H) 10/07/2015   HGBA1C 7.3 (H) 10/01/2015    Review of Glycemic Control:  Results for Nancy [name], Nancy [name] (MRN [id number]) as of 10/07/2015 12:52  Ref. Range 10/06/2015 12:14 10/06/2015 16:11 10/06/2015 21:49 10/07/2015 07:55 10/07/2015 11:46  Glucose-Capillary Latest Ref Range: 65 - 99 mg/dL 226 (H) 82 121 (H) 68 103 (H)    Inpatient Diabetes Program Recommendations:   Consider holding Glucotrol while patient is in the hospital.   Thanks, Adah Perl, RN, BC-ADM Inpatient Diabetes Coordinator Pager [phone] (8a-5p)

## 2015-10-07 NOTE — Discharge Summary (Signed)
Physician Discharge Summary  Patient ID: Nancy [name] MRN: [id number] DOB/AGE: October 20, 1940 75 y.o.  Admit date: 10/01/2015 Discharge date: 10/07/2015  Admission Diagnoses:  Wound infection [facility])  Discharge Diagnoses:  Principal Problem:   Wound infection ([place]) Active Problems:   Essential hypertension, benign   Diastolic dysfunction   Back pain   Gout   Hyperlipidemia   Type 2 diabetes mellitus (HCC)   Angioedema   Infection due to ESBL-producing Escherichia coli   Past Medical History:  Diagnosis Date  . Breast cyst    left breast  . Chest pain   . Complication of anesthesia 1986   slow to awaken after cholecystectomy  . Diastolic dysfunction    with mild pulmonary HTN by echo 07/2011  . Gastric ulcer   . GERD (gastroesophageal reflux disease)   . Gout   . Hyperlipidemia   . Hypertension   . Osteoarthritis   . Osteopenia   . Shortness of breath dyspnea    with activity  . Spine deformity    lower spine  . Type II diabetes mellitus ([place])     Surgeries: Procedure(s): LUMBAR WOUND DEBRIDEMENT on 10/01/2015   Consultants (if any): Treatment Team:  Minerva Ends, MD  Discharged Condition: Improved  Hospital Course: Nancy [name] is an 75 y.o. female who was admitted 10/01/2015 with a diagnosis of Wound infection ([place]) and went to the operating room on 10/01/2015 and underwent the above named procedures.  Wound vac was placed in the OR.  Pt has been managed by Infectious Disease while inpatient an dis on IV antibiotics.  Pt is diabetic this has been manged while inpatient.  PT has worked with pt on a daily basis while inpatient.  Pt urinating w/o difficulty.  Pt had a allergic reaction to Morphine while in patient and was treated for such.  The next night the pt had an episode of confusion and not being aware of her surrounding.  Her pain medication were decreased and all IV narcotics were discontinued.   She was given perioperative antibiotics:   Anti-infectives    Start     Dose/Rate Route Frequency Ordered Stop   10/05/15 1300  ertapenem (INVANZ) 1 g in sodium chloride 0.9 % 50 mL IVPB     1 g 100 mL/hr over 30 Minutes Intravenous Every 24 hours 10/05/15 1156     10/02/15 1930  cefTRIAXone (ROCEPHIN) 2 g in dextrose 5 % 50 mL IVPB  Status:  Discontinued     2 g 100 mL/hr over 30 Minutes Intravenous Every 24 hours 10/02/15 1826 10/05/15 1156   10/02/15 0830  vancomycin (VANCOCIN) IVPB 750 mg/150 ml premix  Status:  Discontinued     750 mg 150 mL/hr over 60 Minutes Intravenous Every 12 hours 10/01/15 2022 10/04/15 1533   10/01/15 2100  ceFAZolin (ANCEF) IVPB 1 g/50 mL premix     1 g 100 mL/hr over 30 Minutes Intravenous Every 8 hours 10/01/15 2037 10/02/15 0607   10/01/15 2030  vancomycin (VANCOCIN) 1,500 mg in sodium chloride 0.9 % 500 mL IVPB     1,500 mg 250 mL/hr over 120 Minutes Intravenous  Once 10/01/15 2022 10/01/15 2322   10/01/15 2030  piperacillin-tazobactam (ZOSYN) IVPB 3.375 g  Status:  Discontinued     3.375 g 12.5 mL/hr over 240 Minutes Intravenous Every 8 hours 10/01/15 2022 10/02/15 1825    .  She was given sequential compression devices, early ambulation, and TED for DVT prophylaxis.  She benefited maximally from  the hospital stay and there were no complications.    Recent vital signs:  Vitals:   10/06/15 2152 10/07/15 0633  BP: 138/64 (!) 120/53  Pulse: 89 87  Resp: 18 17  Temp: 98.3 F (36.8 C) 98.3 F (36.8 C)    Recent laboratory studies:  Lab Results  Component Value Date   HGB 7.0 (L) 10/06/2015   HGB 7.5 (L) 10/05/2015   HGB 7.5 (L) 10/04/2015   Lab Results  Component Value Date   WBC 6.1 10/06/2015   PLT 405 (H) 10/06/2015   Lab Results  Component Value Date   INR 1.18 10/01/2015   Lab Results  Component Value Date   NA 135 10/01/2015   K 3.9 10/01/2015   CL 99 (L) 10/01/2015   CO2 27 10/01/2015   BUN 11 10/01/2015   CREATININE 1.00 10/01/2015   GLUCOSE 164 (H)  10/01/2015    Discharge Medications:     Medication List    TAKE these medications   allopurinol 100 MG tablet Commonly known as:  ZYLOPRIM Take 200 mg by mouth daily.   amLODipine 10 MG tablet Commonly known as:  NORVASC Take 10 mg by mouth every morning.   aspirin EC 81 MG tablet Take 81 mg by mouth daily.   COLCRYS 0.6 MG tablet Generic drug:  colchicine Take 0.6 mg by mouth daily as needed (Gout).   ferrous sulfate 325 (65 FE) MG tablet Take 1 tablet (325 mg total) by mouth 3 (three) times daily with meals.   furosemide 20 MG tablet Commonly known as:  LASIX TAKE ONE TABLET BY MOUTH ONCE DAILY What changed:  See the new instructions.   glipiZIDE 5 MG tablet Commonly known as:  GLUCOTROL Take 5 mg by mouth daily before breakfast.   insulin aspart 100 UNIT/ML injection Commonly known as:  novoLOG Inject 0-15 Units into the skin 3 (three) times daily with meals.   insulin aspart 100 UNIT/ML injection Commonly known as:  novoLOG Inject 0-5 Units into the skin at bedtime.   lisinopril 40 MG tablet Commonly known as:  PRINIVIL,ZESTRIL Take 40 mg by mouth every morning.   metFORMIN 1000 MG tablet Commonly known as:  GLUCOPHAGE Take 1,000 mg by mouth 2 (two) times daily with a meal.   methocarbamol 500 MG tablet Commonly known as:  ROBAXIN Take 1 tablet (500 mg total) by mouth 3 (three) times daily.   metoprolol 100 MG tablet Commonly known as:  LOPRESSOR Take 100 mg by mouth every morning.   omeprazole 20 MG capsule Commonly known as:  PRILOSEC Take 20 mg by mouth daily.   ondansetron 4 MG/2ML Soln injection Commonly known as:  ZOFRAN Inject 2 mLs (4 mg total) into the vein every 4 (four) hours as needed for nausea or vomiting.   oxyCODONE-acetaminophen 5-325 MG tablet Commonly known as:  PERCOCET/ROXICET Take 1-2 tablets by mouth every 4 (four) hours as needed for moderate pain.   simvastatin 20 MG tablet Commonly known as:  ZOCOR Take 20 mg by  mouth every evening.   SYSTANE OP Apply 1 drop to eye as needed (Dry eyes).   traZODone 50 MG tablet Commonly known as:  DESYREL Take 50-100 mg by mouth at bedtime as needed for sleep.       Diagnostic Studies: Dg Lumbar Spine 2-3 Views  Result Date: 09/18/2015 CLINICAL DATA:  75 year old female with a history of postop lumbar surgery. EXAM: LUMBAR SPINE - 2-3 VIEW COMPARISON:  Plain film 09/17/2015, CT 12/14/2012  FINDINGS: Surgical changes of posterior lumbar interbody fusion with bilateral pedicle screw and rod fixation spanning L4-S1. There is unilateral pedicle screw on the right at L5. Interbody spacer in place after discectomy of L4-L5. Alignment relatively maintained, with unchanged trace anterolisthesis of L4 on L5. Disc space narrowing with vacuum disc phenomenon at L5-S1 is unchanged. No complicating features. No fracture identified. IMPRESSION: Status post posterior lumbar interbody fusion spanning L4-S1 with unilateral right L5 pedicle screw and interbody spacer at L4-L5. No complicating features. Signed, Dulcy Fanny. Earleen Newport, DO [facility] Electronically Signed   By: Corrie Mckusick D.O.   On: 09/18/2015 08:55   Dg Lumbar Spine 2-3 Views  Result Date: 09/17/2015 CLINICAL DATA:  75 year old female undergoing lumbar surgery. Initial encounter. EXAM: DG C-ARM GT 120 MIN; LUMBAR SPINE - 2-3 VIEW FLUOROSCOPY TIME:  Fluoroscopy Time:  3 minutes 25 seconds Radiation Exposure Index (if provided by the fluoroscopic device): Number of Acquired Spot Images: None COMPARISON:  CT Abdomen and Pelvis 12/14/2012 FINDINGS: Normal lumbar segmentation demonstrated on the 2014 comparison. Four intraoperative fluoroscopic images of the lower lumbar spine are provided. These demonstrate sequelae of transpedicular fusion hardware placement bilaterally at L4, on the right at L5, and bilaterally at S1. There is an L4-L5 interbody implant. Grade 1  anterolisthesis Re demonstrated at L4-L5. IMPRESSION: Postoperative changes at L4-L5 and L5-S1. Electronically Signed   By: Genevie Ann M.D.   On: 09/17/2015 13:55   Ct Lumbar Spine Wo Contrast  Result Date: 10/06/2015 CLINICAL DATA:  75 y/o  F; severe lumbar spinal pain after fusion. EXAM: CT LUMBAR SPINE WITHOUT CONTRAST TECHNIQUE: Multidetector CT imaging of the lumbar spine was performed without intravenous contrast administration. Multiplanar CT image reconstructions were also generated. COMPARISON:  09/18/2015 lumbar spine radiographs. CT abdomen and pelvis 12/14/2012. FINDINGS: Segmentation: 5 lumbar type vertebrae. Alignment: Lumbar lordosis is maintained. Grade 1 L4-5 anterolisthesis. Vertebrae: Status post posterior instrumented fusion of L4 through S1, left-sided laminectomies, L4-5 foraminotomy and facetectomy. Hardware includes vertical rods fixed by 3 right-sided transpedicular screws and left-sided L4 and S1 transpedicular screws. There is and interbody fusion prosthesis at L4-5. There is a linear tract traversing the left L5 pedicle probably representing sequelae of instrumentation. Vertebral bodies of levels of fusion are partially obscured by streak artifact. Left-sided L5-S1 posterior element morselized bone graft material noted. Paraspinal and other soft tissues: There are open wounds within the lumbar subcutaneous fat consistent with history of recent debridement. There is edema throughout the surrounding soft tissues. Disc levels: Prominent disc bulge at the L3-4 level. Severe disc space narrowing at L5-S1 with endplate degenerative changes. IMPRESSION: 1. Posterior instrumented fusion of L4 through S1. Hardware is intact and no acute hardware related complication is identified. 2. Open wounds within the subcutaneous fat at the L2-3 level and edema throughout paraspinal muscles at levels of fusion. No large discrete fluid collection is identified on this noncontrast study, suboptimal assessment  without intravenous contrast. Electronically Signed   By: Kristine Garbe M.D.   On: 10/06/2015 16:02   Dg C-arm Gt 120 Min  Result Date: 09/17/2015 CLINICAL DATA:  75 year old female undergoing lumbar surgery. Initial encounter. EXAM: DG C-ARM GT 120 MIN; LUMBAR SPINE - 2-3 VIEW FLUOROSCOPY TIME:  Fluoroscopy Time:  3 minutes 25 seconds Radiation Exposure Index (if provided by the fluoroscopic device): Number of Acquired Spot Images: None COMPARISON:  CT Abdomen and Pelvis 12/14/2012 FINDINGS: Normal lumbar segmentation demonstrated on the 2014 comparison. Four intraoperative fluoroscopic images  of the lower lumbar spine are provided. These demonstrate sequelae of transpedicular fusion hardware placement bilaterally at L4, on the right at L5, and bilaterally at S1. There is an L4-L5 interbody implant. Grade 1 anterolisthesis Re demonstrated at L4-L5. IMPRESSION: Postoperative changes at L4-L5 and L5-S1. Electronically Signed   By: Genevie Ann M.D.   On: 09/17/2015 13:55    Disposition: 06-Home-Health Care Svc Wound vac dress needs to be changed 9/27 and 9/29 Pt will present to Dr. Rolena Infante clinic on 10/2 for wound vac dressing change Continue PICC line care Continue IV anx for wound infection Continue daily PT and rehab      Signed: Valinda Hoar 10/07/2015, 2:49 PM

## 2015-10-07 NOTE — Progress Notes (Signed)
Voice message left at [facility] place to notify them that pt will not be discharged from hospital tonight

## 2015-10-07 NOTE — Progress Notes (Signed)
    Subjective: Procedure(s) (LRB): LUMBAR WOUND DEBRIDEMENT (N/A) 6 Days Post-Op  Patient reports pain as 5-6 Reports unchanged leg pain reports incisional back pain   Positive void Negative bowel movement Positive flatus Negative chest pain or shortness of breath  Objective: Vital signs in last 24 hours: Temp:  [98.3 F (36.8 C)] 98.3 F (36.8 C) (09/26 [id number]) Pulse Rate:  [87-89] 87 (09/26 0633) Resp:  [17-18] 17 (09/26 [id number]) BP: (120-138)/(53-64) 120/53 (09/26 0633) SpO2:  [96 %-97 %] 96 % (09/26 [id number])  Intake/Output from previous day: 09/25 0701 - 09/26 0700 In: -  Out: 25 [Drains:25]  Labs:  Recent Labs  10/05/15 0648 10/06/15 0500  WBC 6.8 6.1  RBC 3.06* 2.83*  HCT 24.6* 22.2*  PLT 465* 405*   No results for input(s): NA, K, CL, CO2, BUN, CREATININE, GLUCOSE, CALCIUM in the last 72 hours. No results for input(s): LABPT, INR in the last 72 hours.  Physical Exam: Neurologically intact Intact pulses distally Incision: dressing C/D/I, scant drainage and vac functioning well  No cellulitis present Compartment soft  Assessment/Plan: Patient stable  CT scan: satisfactory  No hardware issues  Continue mobilization with physical therapy Continue care  SW for SNF placement Enema and mag citrate for constipation Continue to encourage mobilization  Melina Schools, MD [place] [phone]

## 2015-10-07 NOTE — Progress Notes (Signed)
SW spoke with nurse who states pt may not be ready for discharge until later this afternoon.  [place] Report Number: [phone] PTAR After: [place], MSW

## 2015-10-07 NOTE — Progress Notes (Signed)
Unit of blood transfusing as ordered. Pt to be discharged to [facility] place tonight

## 2015-10-08 DIAGNOSIS — T148 Other injury of unspecified body region: Secondary | ICD-10-CM

## 2015-10-08 DIAGNOSIS — I519 Heart disease, unspecified: Secondary | ICD-10-CM

## 2015-10-08 DIAGNOSIS — R262 Difficulty in walking, not elsewhere classified: Secondary | ICD-10-CM

## 2015-10-08 LAB — CBC WITH DIFFERENTIAL/PLATELET
Basophils Absolute: 0 10*3/uL (ref 0.0–0.1)
Basophils Relative: 1 %
EOS ABS: 0.3 10*3/uL (ref 0.0–0.7)
EOS PCT: 4 %
HCT: 26.6 % — ABNORMAL LOW (ref 36.0–46.0)
Hemoglobin: 8.5 g/dL — ABNORMAL LOW (ref 12.0–15.0)
LYMPHS ABS: 2 10*3/uL (ref 0.7–4.0)
LYMPHS PCT: 30 %
MCH: 26 pg (ref 26.0–34.0)
MCHC: 32 g/dL (ref 30.0–36.0)
MCV: 81.3 fL (ref 78.0–100.0)
MONO ABS: 0.8 10*3/uL (ref 0.1–1.0)
MONOS PCT: 12 %
Neutro Abs: 3.5 10*3/uL (ref 1.7–7.7)
Neutrophils Relative %: 53 %
PLATELETS: 345 10*3/uL (ref 150–400)
RBC: 3.27 MIL/uL — AB (ref 3.87–5.11)
RDW: 16.5 % — AB (ref 11.5–15.5)
WBC: 6.5 10*3/uL (ref 4.0–10.5)

## 2015-10-08 LAB — GLUCOSE, CAPILLARY
GLUCOSE-CAPILLARY: 98 mg/dL (ref 65–99)
Glucose-Capillary: 72 mg/dL (ref 65–99)

## 2015-10-08 LAB — COMPREHENSIVE METABOLIC PANEL
ALT: 8 U/L — AB (ref 14–54)
ANION GAP: 11 (ref 5–15)
AST: 18 U/L (ref 15–41)
Albumin: 2.4 g/dL — ABNORMAL LOW (ref 3.5–5.0)
Alkaline Phosphatase: 72 U/L (ref 38–126)
BUN: 6 mg/dL (ref 6–20)
CHLORIDE: 101 mmol/L (ref 101–111)
CO2: 27 mmol/L (ref 22–32)
CREATININE: 0.81 mg/dL (ref 0.44–1.00)
Calcium: 8.9 mg/dL (ref 8.9–10.3)
Glucose, Bld: 72 mg/dL (ref 65–99)
Potassium: 2.9 mmol/L — ABNORMAL LOW (ref 3.5–5.1)
SODIUM: 139 mmol/L (ref 135–145)
Total Bilirubin: 1 mg/dL (ref 0.3–1.2)
Total Protein: 6.5 g/dL (ref 6.5–8.1)

## 2015-10-08 LAB — TYPE AND SCREEN
ABO/RH(D): A POS
ANTIBODY SCREEN: NEGATIVE
UNIT DIVISION: 0

## 2015-10-08 LAB — MAGNESIUM: MAGNESIUM: 1.3 mg/dL — AB (ref 1.7–2.4)

## 2015-10-08 LAB — URIC ACID: Uric Acid, Serum: 4 mg/dL (ref 2.3–6.6)

## 2015-10-08 MED ORDER — POTASSIUM CHLORIDE CRYS ER 20 MEQ PO TBCR
80.0000 meq | EXTENDED_RELEASE_TABLET | Freq: Once | ORAL | Status: AC
  Administered 2015-10-08: 80 meq via ORAL
  Filled 2015-10-08: qty 4

## 2015-10-08 MED ORDER — HEPARIN SOD (PORK) LOCK FLUSH 100 UNIT/ML IV SOLN
250.0000 [IU] | INTRAVENOUS | Status: AC | PRN
  Administered 2015-10-08: 250 [IU]

## 2015-10-08 MED ORDER — MAGNESIUM OXIDE 400 (241.3 MG) MG PO TABS: 400.0000 mg | ORAL_TABLET | Freq: Two times a day (BID) | ORAL | 0 refills | Status: DC

## 2015-10-08 MED ORDER — SODIUM CHLORIDE 0.9 % IV SOLN
INTRAVENOUS | Status: DC
  Filled 2015-10-08 (×2): qty 1000

## 2015-10-08 MED ORDER — MAGNESIUM SULFATE 50 % IJ SOLN
3.0000 g | Freq: Once | INTRAVENOUS | Status: AC
  Administered 2015-10-08: 3 g via INTRAVENOUS
  Filled 2015-10-08: qty 6

## 2015-10-08 MED ORDER — HYDROMORPHONE HCL 1 MG/ML IJ SOLN
1.0000 mg | Freq: Once | INTRAMUSCULAR | Status: AC
  Administered 2015-10-08: 1 mg via INTRAVENOUS
  Filled 2015-10-08: qty 1

## 2015-10-08 NOTE — Progress Notes (Signed)
SW spoke with Christy/Admissions of [facility] who confirms that patient is welcomed to come this morning. Also, she confirms that she received the discharge summary yesterday and states that she has the scripts. She states there are no SW needs at this time.   SW will make nurse aware that pt is ready for discharge, and that SW will arrange transportation when ready. Nurse states to arrange transportation for 11:00am. SW called PTAR and scheduled pick up for 11:00am. Daughter made aware. Facility/Admissions ([place]) made aware.   Tilda Burrow, MSW [phone] 10/08/2015 8:59 AM

## 2015-10-08 NOTE — Consult Note (Signed)
[place] Nurse wound consult note Dr Rolena Infante of the ortho service called to request Vac dressing change and discuss plan of care. Wound type: 2 areas of full thickness post-op wounds to middle back Measurement: 4X3X3.5 and 4X3X4cm Wound bed: Beefy red Drainage (amount, consistency, odor) Small amt blood-tinged drainage, no odor Periwound: Intact skin surrounding Dressing procedure/placement/frequency: Pt medicated for pain prior to procedure and family members present to comfort her, but she still cried and yelled the entire time, even when dressings were not being removed or replaced and she was not being touched.  Moistened dressing prior to removal with NS and facilitate removal and decrease discomfort. Applied one piece of black foam to each wound bed and bridged together with another piece of sponge to one track pad.  Did not bridge track pad away from back to reduce pressue, since pt states she is avoiding laying in that position. Cont suction on at 180mm.  Pt given additional pain meds after the procedure was completed since it was very painful. WOC will plan to change dressing Fri if patient is still in the hospital at that time. Julien Girt MSN, RN, [place], [place], [employer]

## 2015-10-08 NOTE — Progress Notes (Signed)
Physical Therapy Treatment Patient Details Name: Nancy [name] MRN: [id number] DOB: Jan 14, 1940 Today's Date: 10/08/2015    History of Present Illness Patient is a 75 y/o female admitted with surgical wound infection.  hx of DM, HTN, HLD, diastolic dysfunction, s/p L4-5 TLIF, posterior fusion L5-S1 on 9/6.    PT Comments    Pt scheduled to go SNF this afternoon.  Pt moves slowly and needs cues for posture.  Moving overall at MIN A.  Follow Up Recommendations  SNF;Supervision/Assistance - 24 hour     Equipment Recommendations  None recommended by PT    Recommendations for Other Services       Precautions / Restrictions Precautions Precautions: Back;Fall Precaution Comments: wound vac Required Braces or Orthoses: Spinal Brace Spinal Brace: Lumbar corset;Applied in sitting position Restrictions Weight Bearing Restrictions: No    Mobility  Bed Mobility Overal bed mobility: Needs Assistance Bed Mobility: Rolling;Sidelying to Sit Rolling: Min assist Sidelying to sit: Min assist       General bed mobility comments: Pt moves very slowly  Transfers Overall transfer level: Needs assistance Equipment used: Rolling walker (2 wheeled) Transfers: Sit to/from Stand Sit to Stand: Min assist         General transfer comment: cues to not bend forward in transfer. Corset tightened in standing  Ambulation/Gait Ambulation/Gait assistance: Min assist Ambulation Distance (Feet): 10 Feet Assistive device: Rolling walker (2 wheeled) Gait Pattern/deviations: Step-through pattern Gait velocity: decreased   General Gait Details: cues for posture and RW placement   Stairs            Wheelchair Mobility    Modified Rankin (Stroke Patients Only)       Balance Overall balance assessment: Needs assistance         Standing balance support: Bilateral upper extremity supported Standing balance-Leahy Scale: Poor Standing balance comment: requires RW. Able to wipe  self after using BSC and keep 1 hand on RW                    Cognition Arousal/Alertness: Awake/alert Behavior During Therapy: WFL for tasks assessed/performed Overall Cognitive Status: Impaired/Different from baseline Area of Impairment: Safety/judgement                    Exercises      General Comments General comments (skin integrity, edema, etc.): Donned brace at EOB      Pertinent Vitals/Pain Pain Assessment: Faces Faces Pain Scale: Hurts little more Pain Location: back Pain Descriptors / Indicators: Grimacing Pain Intervention(s): Limited activity within patient's tolerance;Monitored during session;Premedicated before session;Repositioned    Home Living                      Prior Function            PT Goals (current goals can now be found in the care plan section) Acute Rehab PT Goals Patient Stated Goal: get up to the commode PT Goal Formulation: With patient Time For Goal Achievement: 10/09/15 Potential to Achieve Goals: Good Progress towards PT goals: Progressing toward goals    Frequency    Min 5X/week      PT Plan Current plan remains appropriate    Co-evaluation             End of Session Equipment Utilized During Treatment: Back brace;Gait belt Activity Tolerance: Patient tolerated treatment well;Patient limited by fatigue Patient left: in chair;with call bell/phone within reach;Other (comment) (Family outside room)  Time: [id number] PT Time Calculation (min) (ACUTE ONLY): 25 min  Charges:  $Gait Training: 8-22 mins $Therapeutic Activity: 8-22 mins                    G Codes:      Rani Sisney LUBECK 10/08/2015, 11:49 AM

## 2015-10-08 NOTE — Progress Notes (Signed)
OT Cancellation Note  Patient Details Name: Nancy [name] MRN: [id number] DOB: [birth year]   Cancelled Treatment:    Reason Eval/Treat Not Completed: Fatigue/lethargy limiting ability to participate;Other (comment). Pt fatigued from PT session and is leaving for and leaving for SNF this afternoon  [name] 10/08/2015, 1:17 PM

## 2015-10-08 NOTE — Progress Notes (Signed)
PASRR: [id number] Krystal Clark, MSW [phone]

## 2015-10-08 NOTE — Progress Notes (Signed)
Nurse reached out to SW and asked to reschedule pt's transportation to 1:00pm. SW made PTAR aware of change at 1:00pm. SW left VM on daughters phone and made her aware of time change . SW made facility aware.   Tilda Burrow, MSW

## 2015-10-08 NOTE — Progress Notes (Signed)
Inpatient Diabetes Program Recommendations  AACE/ADA: New Consensus Statement on Inpatient Glycemic Control (2015)  Target Ranges:  Prepandial:   less than 140 mg/dL      Peak postprandial:   less than 180 mg/dL (1-2 hours)      Critically ill patients:  140 - 180 mg/dL   Results for Nancy [name], Nancy [name] (MRN [id number]) as of 10/08/2015 11:51  Ref. Range 10/07/2015 07:55 10/07/2015 11:46 10/07/2015 17:03 10/07/2015 22:02 10/08/2015 07:54  Glucose-Capillary Latest Ref Range: 65 - 99 mg/dL 68 103 (H) 97 72 72    Review of Glycemic Control  Diabetes history: DM2 Outpatient Diabetes medications @ Discharge: Glipizide 5 mg daily before breakfast.  Current orders for Inpatient glycemic control: Sensitive Correction scale TIDWC and QHS, glipizide 5 mg daily before breakfast (no coverage needed in over 24 hours).  Inpatient Diabetes Program Recommendations: Follow up of glycemic control needed long term.  Thank you,  Windy Carina, RN, BSN Diabetes Coordinator Inpatient Diabetes Program [phone] (Team Pager) [phone] ([facility] office) [phone] [facility] office) [phone] [facility] office)

## 2015-10-08 NOTE — Progress Notes (Signed)
    Subjective: 7 Days Post-Op Procedure(s) (LRB): LUMBAR WOUND DEBRIDEMENT (N/A) Patient reports pain as 3 on 0-10 scale.   Denies CP or SOB.  Voiding without difficulty. Positive BM yesterday Objective: Vital signs in last 24 hours: Temp:  [97.8 F (36.6 C)-98.9 F (37.2 C)] 98.5 F (36.9 C) (09/27 0603) Pulse Rate:  [76-96] 96 (09/27 0603) Resp:  [14-18] 18 (09/27 0603) BP: (102-125)/(50-61) 125/58 (09/27 0603) SpO2:  [95 %-98 %] 97 % (09/27 0603)  Intake/Output from previous day: 09/26 0701 - 09/27 0700 In: 335 [Blood:335] Out: 100 [Drains:100] Intake/Output this shift: No intake/output data recorded.  Labs:  Recent Labs  10/06/15 0500 10/08/15 0440  HGB 7.0* 8.5*    Recent Labs  10/06/15 0500 10/08/15 0440  WBC 6.1 6.5  RBC 2.83* 3.27*  HCT 22.2* 26.6*  PLT 405* 345    Recent Labs  10/08/15 0440  NA 139  K 2.9*  CL 101  CO2 27  BUN 6  CREATININE 0.81  GLUCOSE 72  CALCIUM 8.9   No results for input(s): LABPT, INR in the last 72 hours.  Physical Exam: Neurologically intact ABD soft Sensation intact distally Incision: wound vac in place Compartment soft  Assessment/Plan: 7 Days Post-Op Procedure(s) (LRB): LUMBAR WOUND DEBRIDEMENT (N/A) Up with therapy Discharge to SNF  Scripts faxed to SNF yesterday Pt will present to Dr. Rolena Infante clinic on 10/2  Kameko Hukill, Darla Lesches for Dr. Melina Schools [facility] [phone] 10/08/2015, 8:46 AM    Patient ID: Nancy [name], female   DOB: 1940/12/27, 75 y.o.   MRN: [id number]

## 2015-10-08 NOTE — Progress Notes (Signed)
Pt discharged to [facility] via [place].  Pt has SL PICC to RT UA. Pt has wound to lower back, with wound vac tubing in place.  Report called and given to RN.  Pt has no complaints.  Will continue to monitor.

## 2015-10-08 NOTE — Consult Note (Addendum)
[facility] follow-up consult note   Nancy [name]  [id number]  DOB: 1940-12-24  DOA: 10/01/2015  PCP: Antony Blackbird, MD    Requesting physician: Dr.Dahari Rolena Infante Orthopedic surgery  Reason for consultation: Medical management   History of Present Illness: 75 year old female PMHx GERD, PUD, Diastolic CHF, Pulmonary hypertension, HTN,  Gout, HLD, DM Type 2 with complication, OA, Osteopenia who is 2 weeks S/P Transforaminal Lumbar interbody fusion(TLIF). Patient was discharged on 9/9 to home.   Admitted 9/20 with healing complications with drainage and dehiscence of wound. She just underwent a formal I&D and application of wound VAC by Dr. Rolena Infante and we are being consulted for medical management. Patient on IV abx, ID consulted. Wound cultures found to be E Coli ESBL +, patient started on Invanz.          Review of Systems:  Review of Systems  Constitutional: Negative.   HENT: Negative.   Eyes: Negative.   Respiratory: Negative.   Cardiovascular: Negative.   Gastrointestinal: Positive for abdominal pain. Negative for blood in stool, constipation, diarrhea, melena, nausea and vomiting.  Genitourinary: Negative.   Musculoskeletal: Negative.   Skin: Negative.   Neurological: Negative.   Psychiatric/Behavioral: Negative.       Past Medical History:   Past Surgical History: Past Surgical History:  Procedure Laterality Date  . ABDOMINAL ADHESION SURGERY    . ABDOMINAL HYSTERECTOMY    . ANUS SURGERY     for torn tissue  . APPENDECTOMY    . BREAST BIOPSY    . BREAST LUMPECTOMY     left  . CHOLECYSTECTOMY  1986  . COLONOSCOPY    . ESOPHAGOGASTRODUODENOSCOPY (EGD) WITH PROPOFOL N/A 07/18/2013   Procedure: ESOPHAGOGASTRODUODENOSCOPY (EGD) WITH PROPOFOL;  Surgeon: Arta Silence, MD;  Location: [facility];  Service: Endoscopy;  Laterality: N/A;  . EUS N/A 07/18/2013   Procedure: ESOPHAGEAL ENDOSCOPIC ULTRASOUND (EUS) RADIAL;  Surgeon: Arta Silence, MD;   Location: [facility];  Service: Endoscopy;  Laterality: N/A;  . LUMBAR WOUND DEBRIDEMENT N/A 10/01/2015   Procedure: LUMBAR WOUND DEBRIDEMENT;  Surgeon: Melina Schools, MD;  Location: [place];  Service: Orthopedics;  Laterality: N/A;  . MASS EXCISION Left 07/24/2015   Procedure: EXCISION MASS LEFT LONG FINGER;  Surgeon: Leanora Cover, MD;  Location: [place];  Service: Orthopedics;  Laterality: Left;  . SPINAL FUSION N/A 09/17/2015   Procedure: FUSION POSTERIOR SPINAL MULTILEVEL L4- S1;  Surgeon: Melina Schools, MD;  Location: [place];  Service: Orthopedics;  Laterality: N/A;  . TRANSFORAMINAL LUMBAR INTERBODY FUSION (TLIF) WITH PEDICLE SCREW FIXATION 1 LEVEL N/A 09/17/2015   Procedure: TRANSFORAMINAL LUMBAR INTERBODY FUSION (TLIF) WITH PEDICLE SCREW FIXATION 1 LEVEL , Lumbar 4-5;  Surgeon: Melina Schools, MD;  Location: [place];  Service: Orthopedics;  Laterality: N/A;     Allergies:   Allergies  Allergen Reactions  . Morphine And Related Swelling    10/02/15 - given Toradol + morphine and RN noted tongue swelling - given Epi + Benadryl Has had morphine in past w/o problem  . Toradol [Ketorolac Tromethamine] Swelling    10/02/15 - given Toradol + morphine and RN noted tongue swelling - given Epi + Benadryl Has taken ibuprofen in past w/o problem  . Codeine Other (See Comments)    headache      Procedures/Significant Events:  9/6:-Transforaminal lumbar interbody fusion, L4-L5. -Posterior arthrodesis L5-S1 with local bone and allograft DBX mix. -Posterior pedicle screw  fixation, L5-S1. -Posterior fusion instrumentation L4-S1.  Cultures 9/20 negative MRSA by PCR 9/20 right lumbosacral wound positive ESBL    Antimicrobials: Ancef 9/20>> 9/21 Ceftriaxone 9/21>> 9/24 Invanz 9/24>> Zosyn 9/20>> 9/21 Vancomycin 9/20>> 9/23   Devices None   LINES / TUBES:      Continuous Infusions: . 0.9 % sodium chloride with kcl       Physical Exam: Vitals:   10/07/15 1720  10/07/15 1815 10/07/15 1930 10/08/15 0603  BP: 114/61 (!) 124/53 (!) 109/50 (!) 125/58  Pulse: 76 82 78 96  Resp: 16 14 18 18   Temp: 98.1 F (36.7 C) 97.8 F (36.6 C) 98.7 F (37.1 C) 98.5 F (36.9 C)  TempSrc: Oral Oral Oral Oral  SpO2: 98% 97% 95% 97%  Weight:      Height:        General: A/O 4, NAD, sitting in chair comfortably, No acute respiratory distress Eyes: negative scleral hemorrhage, negative anisocoria, negative icterus ENT: Negative Runny nose, negative gingival bleeding, Neck:  Negative scars, masses, torticollis, lymphadenopathy, JVD Lungs: Clear to auscultation bilaterally without wheezes or crackles Cardiovascular: Regular rate and rhythm without murmur gallop or rub normal S1 and S2 Abdomen: negative abdominal pain, nondistended, positive soft, bowel sounds, no rebound, no ascites, no appreciable mass Extremities: No significant cyanosis, clubbing, or edema bilateral lower extremities Skin: Negative rashes, lesions, ulcers Psychiatric:  Negative depression, negative anxiety, negative fatigue, negative mania  Central nervous system:  Cranial nerves II through XII intact, tongue/uvula midline, all extremities muscle strength 5/5, sensation intact throughout, negative dysarthria, negative expressive aphasia, negative receptive aphasia.  Data reviewed:  I have personally reviewed following labs and imaging studies Labs:  CBC:  Recent Labs Lab 10/01/15 1711 10/03/15 0816 10/04/15 0552 10/05/15 0648 10/06/15 0500 10/08/15 0440  WBC 13.0* 7.4 7.9 6.8 6.1 6.5  NEUTROABS 9.6*  --   --   --   --  3.5  HGB 9.6* 7.7* 7.5* 7.5* 7.0* 8.5*  HCT 29.3* 24.8* 24.5* 24.6* 22.2* 26.6*  MCV 80.9 80.0 80.9 80.4 78.4 81.3  PLT 534* 417* 478* 465* 405* [id number]    Basic Metabolic Panel:  Recent Labs Lab 10/01/15 1711 10/01/15 2044 10/08/15 0440  NA 133* 135 139  K 6.3* 3.9 2.9*  CL 97* 99* 101  CO2 25 27 27   GLUCOSE 142* 164* 72  BUN 11 11 6   CREATININE 1.07* 1.00  0.81  CALCIUM 9.4 9.0 8.9  MG  --  1.3* 1.3*  PHOS  --  4.7*  --    GFR Estimated Creatinine Clearance: 62.6 mL/min (by C-G formula based on SCr of 0.81 mg/dL). Liver Function Tests:  Recent Labs Lab 10/01/15 2044 10/08/15 0440  AST 18 18  ALT 12* 8*  ALKPHOS 77 72  BILITOT 0.5 1.0  PROT 7.4 6.5  ALBUMIN 2.8* 2.4*   No results for input(s): LIPASE, AMYLASE in the last 168 hours. No results for input(s): AMMONIA in the last 168 hours. Coagulation profile  Recent Labs Lab 10/01/15 1711  INR 1.18    Cardiac Enzymes: No results for input(s): CKTOTAL, CKMB, CKMBINDEX, TROPONINI in the last 168 hours. BNP: Invalid input(s): POCBNP CBG:  Recent Labs Lab 10/07/15 0755 10/07/15 1146 10/07/15 1703 10/07/15 2202 10/08/15 0754  GLUCAP 68 103* 97 72 72   D-Dimer No results for input(s): DDIMER in the last 72 hours. Hgb A1c No results for input(s): HGBA1C in the last 72 hours. Lipid Profile No results for input(s): CHOL, HDL, LDLCALC, TRIG,  CHOLHDL, LDLDIRECT in the last 72 hours. Thyroid function studies No results for input(s): TSH, T4TOTAL, T3FREE, THYROIDAB in the last 72 hours.  Invalid input(s): FREET3 Anemia work up No results for input(s): VITAMINB12, FOLATE, FERRITIN, TIBC, IRON, RETICCTPCT in the last 72 hours. Urinalysis    Component Value Date/Time   COLORURINE YELLOW 09/20/2015 [place] 09/20/2015 1338   LABSPEC 1.016 09/20/2015 1338   PHURINE 6.0 09/20/2015 1338   GLUCOSEU 100 (A) 09/20/2015 1338   HGBUR NEGATIVE 09/20/2015 1338   BILIRUBINUR NEGATIVE 09/20/2015 1338   KETONESUR 15 (A) 09/20/2015 1338   PROTEINUR NEGATIVE 09/20/2015 1338   UROBILINOGEN 1.0 12/10/2006 0637   NITRITE NEGATIVE 09/20/2015 1338   LEUKOCYTESUR NEGATIVE 09/20/2015 1338     Microbiology Recent Results (from the past 240 hour(s))  Surgical pcr screen     Status: None   Collection Time: 10/01/15  4:56 PM  Result Value Ref Range Status   MRSA, PCR  NEGATIVE NEGATIVE Final   Staphylococcus aureus NEGATIVE NEGATIVE Final    Comment:        The Xpert SA Assay (FDA approved for NASAL specimens in patients over 22 years of age), is one component of a comprehensive surveillance program.  Test performance has been validated by [facility] for patients greater than or equal to 71 year old. It is not intended to diagnose infection nor to guide or monitor treatment.   Aerobic/Anaerobic Culture (surgical/deep wound)     Status: None   Collection Time: 10/01/15  5:44 PM  Result Value Ref Range Status   Specimen Description WOUND  Final   Special Requests RIGHT LUMBAR SPEC A  Final   Gram Stain NO WBC SEEN NO ORGANISMS SEEN   Final   Culture   Final    RARE ESCHERICHIA COLI Confirmed Extended Spectrum Beta-Lactamase Producer (ESBL) NO ANAEROBES ISOLATED    Report Status 10/06/2015 FINAL  Final   Organism ID, Bacteria ESCHERICHIA COLI  Final      Susceptibility   Escherichia coli - MIC*    AMPICILLIN >=32 RESISTANT Resistant     CEFAZOLIN >=64 RESISTANT Resistant     CEFEPIME 8 RESISTANT Resistant     CEFTAZIDIME <=1 RESISTANT Resistant     CEFTRIAXONE >=64 RESISTANT Resistant     CIPROFLOXACIN <=0.25 SENSITIVE Sensitive     GENTAMICIN <=1 SENSITIVE Sensitive     IMIPENEM <=0.25 SENSITIVE Sensitive     TRIMETH/SULFA >=320 RESISTANT Resistant     AMPICILLIN/SULBACTAM >=32 RESISTANT Resistant     PIP/TAZO <=4 SENSITIVE Sensitive     Extended ESBL POSITIVE Resistant     * RARE ESCHERICHIA COLI  Aerobic/Anaerobic Culture (surgical/deep wound)     Status: None   Collection Time: 10/01/15  5:51 PM  Result Value Ref Range Status   Specimen Description WOUND  Final   Special Requests LEFT LUMBAR SPEC B  Final   Gram Stain NO WBC SEEN NO ORGANISMS SEEN   Final   Culture No growth aerobically or anaerobically.  Final   Report Status 10/06/2015 FINAL  Final       Inpatient Medications:   Scheduled Meds: . sodium chloride    Intravenous Once  . allopurinol  200 mg Oral Daily  . amLODipine  10 mg Oral q morning - 10a  . colchicine  0.6 mg Oral Daily  . ertapenem  1 g Intravenous Q24H  . ferrous sulfate  325 mg Oral TID WC  . furosemide  20 mg Oral Daily  .  glipiZIDE  5 mg Oral QAC breakfast  . insulin aspart  0-15 Units Subcutaneous TID WC  . insulin aspart  0-5 Units Subcutaneous QHS  . lubiprostone  24 mcg Oral BID WC  . magnesium sulfate 1 - 4 g bolus IVPB  3 g Intravenous Once  . metoprolol  100 mg Oral q morning - 10a  . pantoprazole  40 mg Oral Daily  . potassium chloride  80 mEq Oral Once  . sodium chloride flush  10-40 mL Intracatheter Q12H   Continuous Infusions: . 0.9 % sodium chloride with kcl       Radiological Exams on Admission: Ct Lumbar Spine Wo Contrast  Result Date: 10/06/2015 CLINICAL DATA:  75 y/o  F; severe lumbar spinal pain after fusion. EXAM: CT LUMBAR SPINE WITHOUT CONTRAST TECHNIQUE: Multidetector CT imaging of the lumbar spine was performed without intravenous contrast administration. Multiplanar CT image reconstructions were also generated. COMPARISON:  09/18/2015 lumbar spine radiographs. CT abdomen and pelvis 12/14/2012. FINDINGS: Segmentation: 5 lumbar type vertebrae. Alignment: Lumbar lordosis is maintained. Grade 1 L4-5 anterolisthesis. Vertebrae: Status post posterior instrumented fusion of L4 through S1, left-sided laminectomies, L4-5 foraminotomy and facetectomy. Hardware includes vertical rods fixed by 3 right-sided transpedicular screws and left-sided L4 and S1 transpedicular screws. There is and interbody fusion prosthesis at L4-5. There is a linear tract traversing the left L5 pedicle probably representing sequelae of instrumentation. Vertebral bodies of levels of fusion are partially obscured by streak artifact. Left-sided L5-S1 posterior element morselized bone graft material noted. Paraspinal and other soft tissues: There are open wounds within the lumbar subcutaneous  fat consistent with history of recent debridement. There is edema throughout the surrounding soft tissues. Disc levels: Prominent disc bulge at the L3-4 level. Severe disc space narrowing at L5-S1 with endplate degenerative changes. IMPRESSION: 1. Posterior instrumented fusion of L4 through S1. Hardware is intact and no acute hardware related complication is identified. 2. Open wounds within the subcutaneous fat at the L2-3 level and edema throughout paraspinal muscles at levels of fusion. No large discrete fluid collection is identified on this noncontrast study, suboptimal assessment without intravenous contrast. Electronically Signed   By: Kristine Garbe M.D.   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 Rolena Infante. -Continue antibiotics per ID    Back pain  -Resolved -Continue current pain regimen.   Anticipated discharge today  Delirium  - Resolved,   Essential hypertension,  -Amlodipine 10 mg daily -Lasix 20 mg daily -Metoprolol 100 mg daily -Lisinopril was held due to angioedema   Diastolic dysfunction -See hypertension . -9/26 transfuse 1 unit PRBC   hemoglobin 8.5  Gout - Stable  -Continue colchicine 0.6 milligram by mouth daily. -Continue allopurinol 200 mg by mouth daily. -Uric acid pending. -Uric acid goal<6 mg/dL  Hyperlipidemia Follow-up with PCP.  Type 2  diabetes mellitus uncontrolled with complications -Carbohydrate modified diet. Hemoglobin A1c 7.3 on 9/20 -Continue glipizide  5 mg by mouth daily before breakfast -Moderate SSI  Angioedema - Resolved  -Patient eating and breathing room air. Likely 2/2 ACEi vs NSAIDs allergic rx.  -ACEi hold - ? If ARBS needed       Thank you for this consultation.  Our [facility] hospitalist team will follow the patient with you.   Time Spent: 28 minutes  Jamaiyah Pyle M.D. [facility] 10/08/2015, 8:19 AM

## 2015-10-13 ENCOUNTER — Ambulatory Visit: Payer: Self-pay | Admitting: Physician Assistant

## 2015-10-13 ENCOUNTER — Encounter: Payer: Self-pay | Admitting: Internal Medicine

## 2015-10-13 ENCOUNTER — Non-Acute Institutional Stay (SKILLED_NURSING_FACILITY): Payer: Medicare Other | Admitting: Internal Medicine

## 2015-10-13 DIAGNOSIS — E1169 Type 2 diabetes mellitus with other specified complication: Secondary | ICD-10-CM | POA: Diagnosis not present

## 2015-10-13 DIAGNOSIS — E876 Hypokalemia: Secondary | ICD-10-CM | POA: Diagnosis not present

## 2015-10-13 DIAGNOSIS — E440 Moderate protein-calorie malnutrition: Secondary | ICD-10-CM

## 2015-10-13 DIAGNOSIS — I10 Essential (primary) hypertension: Secondary | ICD-10-CM | POA: Diagnosis not present

## 2015-10-13 DIAGNOSIS — M545 Low back pain, unspecified: Secondary | ICD-10-CM

## 2015-10-13 DIAGNOSIS — R600 Localized edema: Secondary | ICD-10-CM | POA: Diagnosis not present

## 2015-10-13 DIAGNOSIS — M1000 Idiopathic gout, unspecified site: Secondary | ICD-10-CM

## 2015-10-13 DIAGNOSIS — D62 Acute posthemorrhagic anemia: Secondary | ICD-10-CM | POA: Diagnosis not present

## 2015-10-13 DIAGNOSIS — L089 Local infection of the skin and subcutaneous tissue, unspecified: Secondary | ICD-10-CM

## 2015-10-13 DIAGNOSIS — R531 Weakness: Secondary | ICD-10-CM | POA: Diagnosis not present

## 2015-10-13 DIAGNOSIS — K219 Gastro-esophageal reflux disease without esophagitis: Secondary | ICD-10-CM

## 2015-10-13 DIAGNOSIS — T148XXA Other injury of unspecified body region, initial encounter: Secondary | ICD-10-CM | POA: Diagnosis not present

## 2015-10-13 DIAGNOSIS — E782 Mixed hyperlipidemia: Secondary | ICD-10-CM | POA: Diagnosis not present

## 2015-10-13 NOTE — Progress Notes (Signed)
LOCATION: [facility]  PCP: Antony Blackbird, MD   Code Status: Full Code  Goals of care: Advanced Directive information Advanced Directives 10/01/2015  Does patient have an advance directive? No  Would patient like information on creating an advanced directive? No - patient declined information       Extended Emergency Contact Information Primary Emergency Contact: Pagett,Malinda Address: [address]  [name] Phone: [phone] Relation: Daughter Secondary Emergency Contact: Roseborough,Kevin Address: [place], [place] [address] [place] Phone: [phone] Relation: Son   Allergies  Allergen Reactions  . Morphine And Related Swelling    10/02/15 - given Toradol + morphine and RN noted tongue swelling - given Epi + Benadryl Has had morphine in past w/o problem  . Toradol [Ketorolac Tromethamine] Swelling    10/02/15 - given Toradol + morphine and RN noted tongue swelling - given Epi + Benadryl Has taken ibuprofen in past w/o problem  . Codeine Other (See Comments)    headache    No chief complaint on file.    HPI:  Patient is a 75 y.o. female seen today for short term rehabilitation post hospital admission from 10/01/15-10/07/15 with wound infection to lumbar region. She underwent lumbar wound debridement on 10/01/15 and had wound vac placed. She was also placed on iv antibiotic. She is seen in her room today. She has PMH of HTN, diastolic CHF, HLD, Type 2 DM among others. Per nursing, she has been noted ot have low cbg reading of 54 and 55  Review of Systems:  Constitutional: Negative for fever, chills and diaphoresis.  HENT: Negative for headache, congestion, nasal discharge Eyes: Negative for blurred vision, double vision and discharge.  Respiratory: Negative for cough, shortness of breath and wheezing.   Cardiovascular: Negative for chest pain, palpitations, leg swelling.  Gastrointestinal:  Negative for heartburn, nausea, vomiting, abdominal pain. Had bowel movement this am. Black colored stool. She is on iron Genitourinary: Negative for dysuria and flank pain.  Musculoskeletal: Negative for fall. positive for back pain Skin: Negative for itching, rash.  Neurological: Positive for occasional dizziness. Psychiatric/Behavioral: Negative for depression   Past Medical History:  Diagnosis Date  . Breast cyst    left breast  . Chest pain   . Complication of anesthesia 1986   slow to awaken after cholecystectomy  . Diastolic dysfunction    with mild pulmonary HTN by echo 07/2011  . Gastric ulcer   . GERD (gastroesophageal reflux disease)   . Gout   . Hyperlipidemia   . Hypertension   . Osteoarthritis   . Osteopenia   . Shortness of breath dyspnea    with activity  . Spine deformity    lower spine  . Type II diabetes mellitus ([place])    Past Surgical History:  Procedure Laterality Date  . ABDOMINAL ADHESION SURGERY    . ABDOMINAL HYSTERECTOMY    . ANUS SURGERY     for torn tissue  . APPENDECTOMY    . BREAST BIOPSY    . BREAST LUMPECTOMY     left  . CHOLECYSTECTOMY  1986  . COLONOSCOPY    . ESOPHAGOGASTRODUODENOSCOPY (EGD) WITH PROPOFOL N/A 07/18/2013   Procedure: ESOPHAGOGASTRODUODENOSCOPY (EGD) WITH PROPOFOL;  Surgeon: Arta Silence, MD;  Location: [facility];  Service: Endoscopy;  Laterality: N/A;  . EUS N/A 07/18/2013   Procedure: ESOPHAGEAL ENDOSCOPIC ULTRASOUND (EUS) RADIAL;  Surgeon: Arta Silence, MD;  Location: [facility];  Service: Endoscopy;  Laterality: N/A;  . LUMBAR WOUND DEBRIDEMENT N/A 10/01/2015   Procedure: LUMBAR WOUND DEBRIDEMENT;  Surgeon: Melina Schools, MD;  Location: [facility];  Service: Orthopedics;  Laterality: N/A;  . MASS EXCISION Left 07/24/2015   Procedure: EXCISION MASS LEFT LONG FINGER;  Surgeon: Leanora Cover, MD;  Location: [facility];  Service: Orthopedics;  Laterality: Left;  . SPINAL FUSION N/A 09/17/2015   Procedure:  FUSION POSTERIOR SPINAL MULTILEVEL L4- S1;  Surgeon: Melina Schools, MD;  Location: [facility];  Service: Orthopedics;  Laterality: N/A;  . TRANSFORAMINAL LUMBAR INTERBODY FUSION (TLIF) WITH PEDICLE SCREW FIXATION 1 LEVEL N/A 09/17/2015   Procedure: TRANSFORAMINAL LUMBAR INTERBODY FUSION (TLIF) WITH PEDICLE SCREW FIXATION 1 LEVEL , Lumbar 4-5;  Surgeon: Melina Schools, MD;  Location: [facility];  Service: Orthopedics;  Laterality: N/A;   Social History:   reports that she has never smoked. She has never used smokeless tobacco. She reports that she drinks alcohol. She reports that she does not use drugs.  Family History  Problem Relation Age of Onset  . Heart disease Mother   . Cancer Sister     ovarian    Medications:   Medication List       Accurate as of 10/13/15  3:05 PM. Always use your most recent med list.          allopurinol 100 MG tablet Commonly known as:  ZYLOPRIM Take 200 mg by mouth daily.   amLODipine 10 MG tablet Commonly known as:  NORVASC Take 10 mg by mouth every morning.   aspirin EC 81 MG tablet Take 81 mg by mouth daily.   COLCRYS 0.6 MG tablet Generic drug:  colchicine Take 0.6 mg by mouth daily.   ERTAPENEM SODIUM IV Inject 50 mLs into the vein daily.   ferrous sulfate 325 (65 FE) MG tablet Take 1 tablet (325 mg total) by mouth 3 (three) times daily with meals.   furosemide 20 MG tablet Commonly known as:  LASIX TAKE ONE TABLET BY MOUTH ONCE DAILY   glipiZIDE 5 MG tablet Commonly known as:  GLUCOTROL Take 5 mg by mouth daily before breakfast.   insulin aspart 100 UNIT/ML injection Commonly known as:  novoLOG Inject 0-5 Units into the skin at bedtime.   insulin lispro 100 UNIT/ML injection Commonly known as:  HUMALOG Inject 2-14 Units into the skin 3 (three) times daily before meals. 200-250= 2 units, 251-300= 4 units, 301-350= 6 units, 351-400= 8 units, 401-450= 12 units; IF HIGH GIVE 14 UNITS RECHECK IN 2 HOURS IF >400 /<100 CALL MD   lisinopril  40 MG tablet Commonly known as:  PRINIVIL,ZESTRIL Take 40 mg by mouth every morning.   metFORMIN 1000 MG tablet Commonly known as:  GLUCOPHAGE Take 1,000 mg by mouth 2 (two) times daily with a meal.   methocarbamol 500 MG tablet Commonly known as:  ROBAXIN Take 1 tablet (500 mg total) by mouth 3 (three) times daily.   metoprolol 100 MG tablet Commonly known as:  LOPRESSOR Take 100 mg by mouth every morning.   omeprazole 20 MG capsule Commonly known as:  PRILOSEC Take 20 mg by mouth daily.   ondansetron 4 MG tablet Commonly known as:  ZOFRAN Take 4 mg by mouth every 8 (eight) hours as needed for nausea or vomiting.   oxyCODONE-acetaminophen 5-325 MG tablet Commonly known as:  PERCOCET/ROXICET Take 1-2 tablets by mouth every 4 (four) hours as needed for moderate pain.   simvastatin  20 MG tablet Commonly known as:  ZOCOR Take 20 mg by mouth every evening.   SYSTANE OP Apply 1 drop to eye as needed (Dry eyes).   traZODone 50 MG tablet Commonly known as:  DESYREL Take 50-100 mg by mouth at bedtime as needed for sleep.       Immunizations:  There is no immunization history on file for this patient.   Physical Exam:  Vital signs not available for review  General- elderly female, obese, in no acute distress Head- normocephalic, atraumatic Nose- no nasal discharge Throat- moist mucus membrane Eyes- PERRLA, EOMI, no pallor, no icterus Neck- no cervical lymphadenopathy Cardiovascular- normal s1,s2, no murmur, trace leg edema Respiratory- bilateral clear to auscultation, no wheeze, no rhonchi, no crackles, no use of accessory muscles Abdomen- bowel sounds present, soft, non tender Musculoskeletal- able to move all 4 extremities, limited ROM on her back Neurological- alert and oriented to person, place and time Skin- warm and dry, wound vac to lumbar area, PICC line RUE Psychiatry- flat affect       Labs reviewed:   Labs reviewed: Basic Metabolic  Panel:  Recent Labs  10/01/15 1711 10/01/15 2044 10/08/15 0440  NA 133* 135 139  K 6.3* 3.9 2.9*  CL 97* 99* 101  CO2 25 27 27   GLUCOSE 142* 164* 72  BUN 11 11 6   CREATININE 1.07* 1.00 0.81  CALCIUM 9.4 9.0 8.9  MG  --  1.3* 1.3*  PHOS  --  4.7*  --    Liver Function Tests:  Recent Labs  10/01/15 2044 10/08/15 0440  AST 18 18  ALT 12* 8*  ALKPHOS 77 72  BILITOT 0.5 1.0  PROT 7.4 6.5  ALBUMIN 2.8* 2.4*   No results for input(s): LIPASE, AMYLASE in the last 8760 hours. No results for input(s): AMMONIA in the last 8760 hours. CBC:  Recent Labs  10/01/15 1711  10/05/15 0648 10/06/15 0500 10/08/15 0440  WBC 13.0*  < > 6.8 6.1 6.5  NEUTROABS 9.6*  --   --   --  3.5  HGB 9.6*  < > 7.5* 7.0* 8.5*  HCT 29.3*  < > 24.6* 22.2* 26.6*  MCV 80.9  < > 80.4 78.4 81.3  PLT 534*  < > 465* 405* 345  < > = values in this interval not displayed. Cardiac Enzymes: No results for input(s): CKTOTAL, CKMB, CKMBINDEX, TROPONINI in the last 8760 hours. BNP: Invalid input(s): POCBNP CBG:  Recent Labs  10/07/15 2202 10/08/15 0754 10/08/15 1159  GLUCAP 72 72 98    Radiological Exams: Dg Lumbar Spine 2-3 Views  Result Date: 09/18/2015 CLINICAL DATA:  75 year old female with a history of postop lumbar surgery. EXAM: LUMBAR SPINE - 2-3 VIEW COMPARISON:  Plain film 09/17/2015, CT 12/14/2012 FINDINGS: Surgical changes of posterior lumbar interbody fusion with bilateral pedicle screw and rod fixation spanning L4-S1. There is unilateral pedicle screw on the right at L5. Interbody spacer in place after discectomy of L4-L5. Alignment relatively maintained, with unchanged trace anterolisthesis of L4 on L5. Disc space narrowing with vacuum disc phenomenon at L5-S1 is unchanged. No complicating features. No fracture identified. IMPRESSION: Status post posterior lumbar interbody fusion spanning L4-S1 with unilateral right L5 pedicle screw and interbody spacer at L4-L5. No complicating features.  Signed, Dulcy Fanny. Earleen Newport, DO [facility] Electronically Signed   By: Corrie Mckusick D.O.   On: 09/18/2015 08:55   Dg Lumbar Spine 2-3 Views  Result Date: 09/17/2015 CLINICAL  DATA:  75 year old female undergoing lumbar surgery. Initial encounter. EXAM: DG C-ARM GT 120 MIN; LUMBAR SPINE - 2-3 VIEW FLUOROSCOPY TIME:  Fluoroscopy Time:  3 minutes 25 seconds Radiation Exposure Index (if provided by the fluoroscopic device): Number of Acquired Spot Images: None COMPARISON:  CT Abdomen and Pelvis 12/14/2012 FINDINGS: Normal lumbar segmentation demonstrated on the 2014 comparison. Four intraoperative fluoroscopic images of the lower lumbar spine are provided. These demonstrate sequelae of transpedicular fusion hardware placement bilaterally at L4, on the right at L5, and bilaterally at S1. There is an L4-L5 interbody implant. Grade 1 anterolisthesis Re demonstrated at L4-L5. IMPRESSION: Postoperative changes at L4-L5 and L5-S1. Electronically Signed   By: Genevie Ann M.D.   On: 09/17/2015 13:55   Ct Lumbar Spine Wo Contrast  Result Date: 10/06/2015 CLINICAL DATA:  75 y/o  F; severe lumbar spinal pain after fusion. EXAM: CT LUMBAR SPINE WITHOUT CONTRAST TECHNIQUE: Multidetector CT imaging of the lumbar spine was performed without intravenous contrast administration. Multiplanar CT image reconstructions were also generated. COMPARISON:  09/18/2015 lumbar spine radiographs. CT abdomen and pelvis 12/14/2012. FINDINGS: Segmentation: 5 lumbar type vertebrae. Alignment: Lumbar lordosis is maintained. Grade 1 L4-5 anterolisthesis. Vertebrae: Status post posterior instrumented fusion of L4 through S1, left-sided laminectomies, L4-5 foraminotomy and facetectomy. Hardware includes vertical rods fixed by 3 right-sided transpedicular screws and left-sided L4 and S1 transpedicular screws. There is and interbody fusion prosthesis at L4-5. There is a linear tract traversing the  left L5 pedicle probably representing sequelae of instrumentation. Vertebral bodies of levels of fusion are partially obscured by streak artifact. Left-sided L5-S1 posterior element morselized bone graft material noted. Paraspinal and other soft tissues: There are open wounds within the lumbar subcutaneous fat consistent with history of recent debridement. There is edema throughout the surrounding soft tissues. Disc levels: Prominent disc bulge at the L3-4 level. Severe disc space narrowing at L5-S1 with endplate degenerative changes. IMPRESSION: 1. Posterior instrumented fusion of L4 through S1. Hardware is intact and no acute hardware related complication is identified. 2. Open wounds within the subcutaneous fat at the L2-3 level and edema throughout paraspinal muscles at levels of fusion. No large discrete fluid collection is identified on this noncontrast study, suboptimal assessment without intravenous contrast. Electronically Signed   By: Kristine Garbe M.D.   On: 10/06/2015 16:02   Dg C-arm Gt 120 Min  Result Date: 09/17/2015 CLINICAL DATA:  75 year old female undergoing lumbar surgery. Initial encounter. EXAM: DG C-ARM GT 120 MIN; LUMBAR SPINE - 2-3 VIEW FLUOROSCOPY TIME:  Fluoroscopy Time:  3 minutes 25 seconds Radiation Exposure Index (if provided by the fluoroscopic device): Number of Acquired Spot Images: None COMPARISON:  CT Abdomen and Pelvis 12/14/2012 FINDINGS: Normal lumbar segmentation demonstrated on the 2014 comparison. Four intraoperative fluoroscopic images of the lower lumbar spine are provided. These demonstrate sequelae of transpedicular fusion hardware placement bilaterally at L4, on the right at L5, and bilaterally at S1. There is an L4-L5 interbody implant. Grade 1 anterolisthesis Re demonstrated at L4-L5. IMPRESSION: Postoperative changes at L4-L5 and L5-S1. Electronically Signed   By: Genevie Ann M.D.   On: 09/17/2015 13:55    Assessment/Plan  Generalized weakness Will  have her work with physical therapy and occupational therapy team to help with gait training and muscle strengthening exercises.fall precautions. Skin care. Encourage to be out of bed.   Lumbar pain S/p recent wound infection debridement and has wound vac. Continue percocet 5-325 mg but change frequency to q4h prn for pain. Continue robaxin  500 mg tid for muscle spasm.   Lumbar wound infection S/p wound debridement and wound vac placement. Awaiting wound closure. Has orthopedic follow up 10/15/15. Continue invanz via picc line until 11/11/15.   Blood loss anemia Post op. Continue ferrous sulfate. Monitor cbc  Protein calorie malnutrition RD to evaluate, monitor her intake  Hypokalemia Monitor bmp with her on lasix  Hypomagnesemia Check bmp  HTN Continue norvasc 10 mg daily with lisinopril 40 mg daily and lopressor 100 mg daily, check bp  Leg edema Continue lasix 20 mg daily, check bmp, add ted hose  Gout No recent flare up. Continue allopurinol and colchicine for now  Dm type 2 Monitor cbg. Few hypoglycemic reading. Continue novolog SSI with meals and discontinue the bedtime dosing. Discontinue glipizide for now. continue metformin 1000 mg bid  HLD Continue simvastatin   gerd Stable symptom on omeprazole, no change made     Goals of care: short term rehabilitation   Labs/tests ordered: cbc with diff and cmp weekly for now  Family/ staff Communication: reviewed care plan with patient, her daughter and nursing supervisor    Blanchie Serve, MD Internal Medicine [place], [place] [address] Cell Phone (Monday-Friday 8 am - 5 pm): [phone] On Call: [phone] and follow prompts after 5 pm and on weekends Office Phone: [phone] Office Fax: [phone]

## 2015-10-14 ENCOUNTER — Encounter (HOSPITAL_COMMUNITY): Payer: Self-pay | Admitting: *Deleted

## 2015-10-14 MED ORDER — ERTAPENEM SODIUM 1 G IJ SOLR: 1.0000 g | INTRAMUSCULAR | Status: DC

## 2015-10-14 MED ORDER — SODIUM CHLORIDE 0.9 % IV SOLN: 1.0000 g | INTRAVENOUS | Status: DC

## 2015-10-14 MED ORDER — SODIUM CHLORIDE 0.9 % IV SOLN
1.0000 g | INTRAVENOUS | Status: AC
  Administered 2015-10-15: 1 g via INTRAVENOUS
  Filled 2015-10-14: qty 1

## 2015-10-14 NOTE — Pre-Procedure Instructions (Addendum)
    Nancy [name]  10/14/2015    Your procedure is scheduled on Wednesday, October 4th.  Report to [facility] Admitting at 11:30 AM A.M.  Call this number if you have problems the morning of surgery:  [phone]   Remember:  Do not eat food or drink liquids after midnight.  Take these medicines the morning of surgery with A SIP OF WATER: Omeprazole, Colichocone, Lopressor, Norvasc.    If needed:  Percocet and Zofran, Methocarbamol. Do not give Lasix or Lisinopril the morning of surgery . Diabetic Medication Instructions:      Do Not Take Metformin the morning of Surgery.  NO Humolog Insulin at Bedtime tonight. Morning of Surgery : If your CBG is greater than 220 mg/dL, you may take  of your sliding scale (correction) dose of insulin.  . If your blood sugar is less than 70 mg/dL, you will need to treat for low blood sugar: o ,4 glucose tablets, OR glucose gel.  o Recheck blood sugar in 15 minutes after treatment (to make sure it is greater than 70 mg/dL). If your blood sugar is not greater than 70 mg/dL on recheck, call [phone] for further instructions. . Report your blood sugar to the short stay nurse when you get to Short Stay.  Please Send Current Medication Record with last doses documented.

## 2015-10-15 ENCOUNTER — Ambulatory Visit (HOSPITAL_COMMUNITY): Payer: Medicare Other | Admitting: Emergency Medicine

## 2015-10-15 ENCOUNTER — Encounter (HOSPITAL_COMMUNITY): Payer: Self-pay | Admitting: *Deleted

## 2015-10-15 ENCOUNTER — Observation Stay (HOSPITAL_COMMUNITY)
Admission: RE | Admit: 2015-10-15 | Discharge: 2015-10-16 | Disposition: A | Discharge status: Skilled Nursing Facility | Payer: Medicare Other | Source: Ambulatory Visit | Attending: Orthopedic Surgery | Admitting: Orthopedic Surgery

## 2015-10-15 ENCOUNTER — Encounter (HOSPITAL_COMMUNITY)
Admission: RE | Disposition: A | Discharge status: Skilled Nursing Facility | Payer: Self-pay | Source: Ambulatory Visit | Attending: Orthopedic Surgery

## 2015-10-15 DIAGNOSIS — M109 Gout, unspecified: Secondary | ICD-10-CM | POA: Diagnosis not present

## 2015-10-15 DIAGNOSIS — L089 Local infection of the skin and subcutaneous tissue, unspecified: Secondary | ICD-10-CM | POA: Diagnosis present

## 2015-10-15 DIAGNOSIS — T8131XA Disruption of external operation (surgical) wound, not elsewhere classified, initial encounter: Secondary | ICD-10-CM | POA: Insufficient documentation

## 2015-10-15 DIAGNOSIS — T814XXA Infection following a procedure, initial encounter: Principal | ICD-10-CM | POA: Insufficient documentation

## 2015-10-15 DIAGNOSIS — Z79899 Other long term (current) drug therapy: Secondary | ICD-10-CM | POA: Insufficient documentation

## 2015-10-15 DIAGNOSIS — E785 Hyperlipidemia, unspecified: Secondary | ICD-10-CM | POA: Diagnosis not present

## 2015-10-15 DIAGNOSIS — Z794 Long term (current) use of insulin: Secondary | ICD-10-CM | POA: Insufficient documentation

## 2015-10-15 DIAGNOSIS — Y831 Surgical operation with implant of artificial internal device as the cause of abnormal reaction of the patient, or of later complication, without mention of misadventure at the time of the procedure: Secondary | ICD-10-CM | POA: Diagnosis not present

## 2015-10-15 DIAGNOSIS — E1140 Type 2 diabetes mellitus with diabetic neuropathy, unspecified: Secondary | ICD-10-CM | POA: Insufficient documentation

## 2015-10-15 DIAGNOSIS — K219 Gastro-esophageal reflux disease without esophagitis: Secondary | ICD-10-CM | POA: Diagnosis not present

## 2015-10-15 DIAGNOSIS — I10 Essential (primary) hypertension: Secondary | ICD-10-CM | POA: Diagnosis not present

## 2015-10-15 DIAGNOSIS — T148XXA Other injury of unspecified body region, initial encounter: Secondary | ICD-10-CM

## 2015-10-15 DIAGNOSIS — Z7982 Long term (current) use of aspirin: Secondary | ICD-10-CM | POA: Insufficient documentation

## 2015-10-15 HISTORY — DX: Polyneuropathy, unspecified: G62.9

## 2015-10-15 HISTORY — PX: LUMBAR WOUND DEBRIDEMENT: SHX1988

## 2015-10-15 LAB — CBC
HCT: 30.8 % — ABNORMAL LOW (ref 36.0–46.0)
Hemoglobin: 9.5 g/dL — ABNORMAL LOW (ref 12.0–15.0)
MCH: 25 pg — AB (ref 26.0–34.0)
MCHC: 30.8 g/dL (ref 30.0–36.0)
MCV: 81.1 fL (ref 78.0–100.0)
PLATELETS: 328 10*3/uL (ref 150–400)
RBC: 3.8 MIL/uL — AB (ref 3.87–5.11)
RDW: 17.3 % — ABNORMAL HIGH (ref 11.5–15.5)
WBC: 6 10*3/uL (ref 4.0–10.5)

## 2015-10-15 LAB — BASIC METABOLIC PANEL
ANION GAP: 9 (ref 5–15)
BUN: 5 mg/dL — ABNORMAL LOW (ref 6–20)
CHLORIDE: 104 mmol/L (ref 101–111)
CO2: 27 mmol/L (ref 22–32)
Calcium: 9.1 mg/dL (ref 8.9–10.3)
Creatinine, Ser: 0.8 mg/dL (ref 0.44–1.00)
GFR calc Af Amer: >60 mL/min (ref >60)
GLUCOSE: 102 mg/dL — AB (ref 65–99)
POTASSIUM: 3.7 mmol/L (ref 3.5–5.1)
SODIUM: 140 mmol/L (ref 135–145)

## 2015-10-15 LAB — GLUCOSE, CAPILLARY
GLUCOSE-CAPILLARY: 88 mg/dL (ref 65–99)
GLUCOSE-CAPILLARY: 81 mg/dL (ref 65–99)
Glucose-Capillary: 80 mg/dL (ref 65–99)
Glucose-Capillary: 99 mg/dL (ref 65–99)

## 2015-10-15 SURGERY — LUMBAR WOUND DEBRIDEMENT
Anesthesia: General | Site: Back

## 2015-10-15 MED ORDER — PHENOL 1.4 % MT LIQD: 1.0000 | OROMUCOSAL | Status: DC | PRN

## 2015-10-15 MED ORDER — SODIUM CHLORIDE 0.9 % IV SOLN: 250.0000 mL | INTRAVENOUS | Status: DC

## 2015-10-15 MED ORDER — INSULIN ASPART 100 UNIT/ML ~~LOC~~ SOLN: 0.0000 [IU] | Freq: Every day | SUBCUTANEOUS | Status: DC

## 2015-10-15 MED ORDER — LIDOCAINE 2% (20 MG/ML) 5 ML SYRINGE
INTRAMUSCULAR | Status: AC
  Filled 2015-10-15: qty 5

## 2015-10-15 MED ORDER — VANCOMYCIN HCL 500 MG IV SOLR
INTRAVENOUS | Status: DC | PRN
  Administered 2015-10-15 (×2): 500 mg via TOPICAL

## 2015-10-15 MED ORDER — FERROUS SULFATE 325 (65 FE) MG PO TABS
325.0000 mg | ORAL_TABLET | Freq: Three times a day (TID) | ORAL | Status: DC
  Administered 2015-10-16 (×2): 325 mg via ORAL
  Filled 2015-10-15 (×2): qty 1

## 2015-10-15 MED ORDER — FENTANYL CITRATE (PF) 100 MCG/2ML IJ SOLN
100.0000 ug | Freq: Once | INTRAMUSCULAR | Status: AC
Start: 2015-10-15 — End: 2015-10-15
  Administered 2015-10-15: 50 ug via INTRAVENOUS
  Filled 2015-10-15: qty 2

## 2015-10-15 MED ORDER — SUGAMMADEX SODIUM 200 MG/2ML IV SOLN
INTRAVENOUS | Status: AC
  Filled 2015-10-15: qty 2

## 2015-10-15 MED ORDER — SODIUM CHLORIDE 0.9 % IR SOLN
Status: DC | PRN
  Administered 2015-10-15: 1000 mL

## 2015-10-15 MED ORDER — LIDOCAINE HCL (CARDIAC) 20 MG/ML IV SOLN
INTRAVENOUS | Status: DC | PRN
  Administered 2015-10-15: 60 mg via INTRAVENOUS

## 2015-10-15 MED ORDER — VANCOMYCIN HCL 1000 MG IV SOLR
INTRAVENOUS | Status: AC
  Filled 2015-10-15: qty 1000

## 2015-10-15 MED ORDER — FENTANYL CITRATE (PF) 100 MCG/2ML IJ SOLN
INTRAMUSCULAR | Status: AC
  Filled 2015-10-15: qty 2

## 2015-10-15 MED ORDER — AMLODIPINE BESYLATE 10 MG PO TABS
10.0000 mg | ORAL_TABLET | Freq: Every morning | ORAL | Status: DC
  Administered 2015-10-16: 10 mg via ORAL
  Filled 2015-10-15: qty 1

## 2015-10-15 MED ORDER — ONDANSETRON HCL 4 MG/2ML IJ SOLN
INTRAMUSCULAR | Status: DC | PRN
  Administered 2015-10-15: 4 mg via INTRAVENOUS

## 2015-10-15 MED ORDER — ONDANSETRON HCL 4 MG/2ML IJ SOLN
INTRAMUSCULAR | Status: AC
  Filled 2015-10-15: qty 2

## 2015-10-15 MED ORDER — 0.9 % SODIUM CHLORIDE (POUR BTL) OPTIME
TOPICAL | Status: DC | PRN
  Administered 2015-10-15: 1000 mL

## 2015-10-15 MED ORDER — SODIUM CHLORIDE 0.9% FLUSH: 3.0000 mL | Freq: Two times a day (BID) | INTRAVENOUS | Status: DC

## 2015-10-15 MED ORDER — MIDAZOLAM HCL 2 MG/2ML IJ SOLN
INTRAMUSCULAR | Status: AC
  Filled 2015-10-15: qty 2

## 2015-10-15 MED ORDER — FENTANYL CITRATE (PF) 100 MCG/2ML IJ SOLN: 25.0000 ug | INTRAMUSCULAR | Status: DC | PRN

## 2015-10-15 MED ORDER — METFORMIN HCL 500 MG PO TABS
1000.0000 mg | ORAL_TABLET | Freq: Two times a day (BID) | ORAL | Status: DC
  Administered 2015-10-16: 1000 mg via ORAL
  Filled 2015-10-15: qty 2

## 2015-10-15 MED ORDER — SODIUM CHLORIDE 0.9% FLUSH
10.0000 mL | INTRAVENOUS | Status: DC | PRN
  Administered 2015-10-16: 10 mL
  Filled 2015-10-15: qty 40

## 2015-10-15 MED ORDER — ALLOPURINOL 100 MG PO TABS
200.0000 mg | ORAL_TABLET | Freq: Every day | ORAL | Status: DC
  Administered 2015-10-16: 200 mg via ORAL
  Filled 2015-10-15: qty 2

## 2015-10-15 MED ORDER — ONDANSETRON HCL 4 MG/2ML IJ SOLN: 4.0000 mg | INTRAMUSCULAR | Status: DC | PRN

## 2015-10-15 MED ORDER — OXYCODONE HCL 5 MG/5ML PO SOLN: 5.0000 mg | Freq: Once | ORAL | Status: AC | PRN

## 2015-10-15 MED ORDER — LACTATED RINGERS IV SOLN
INTRAVENOUS | Status: DC
Start: 2015-10-15 — End: 2015-10-16
  Administered 2015-10-15: 21:00:00 via INTRAVENOUS

## 2015-10-15 MED ORDER — OXYCODONE HCL 5 MG PO TABS
5.0000 mg | ORAL_TABLET | Freq: Once | ORAL | Status: AC | PRN
  Administered 2015-10-15: 5 mg via ORAL

## 2015-10-15 MED ORDER — ONDANSETRON HCL 4 MG PO TABS: 4.0000 mg | ORAL_TABLET | Freq: Three times a day (TID) | ORAL | Status: DC | PRN

## 2015-10-15 MED ORDER — VANCOMYCIN HCL 500 MG IV SOLR
INTRAVENOUS | Status: AC
  Filled 2015-10-15: qty 500

## 2015-10-15 MED ORDER — ERTAPENEM SODIUM 1 G IJ SOLR
1.0000 g | INTRAMUSCULAR | Status: DC
  Filled 2015-10-15: qty 1

## 2015-10-15 MED ORDER — ROCURONIUM BROMIDE 10 MG/ML (PF) SYRINGE
PREFILLED_SYRINGE | INTRAVENOUS | Status: AC
  Filled 2015-10-15: qty 10

## 2015-10-15 MED ORDER — LACTATED RINGERS IV SOLN
INTRAVENOUS | Status: DC
  Administered 2015-10-15 (×2): via INTRAVENOUS

## 2015-10-15 MED ORDER — SUGAMMADEX SODIUM 200 MG/2ML IV SOLN
INTRAVENOUS | Status: DC | PRN
  Administered 2015-10-15: 170 mg via INTRAVENOUS

## 2015-10-15 MED ORDER — OXYCODONE-ACETAMINOPHEN 5-325 MG PO TABS
1.0000 | ORAL_TABLET | ORAL | Status: DC | PRN
  Administered 2015-10-15: 1 via ORAL
  Administered 2015-10-16 (×4): 2 via ORAL
  Filled 2015-10-15: qty 2
  Filled 2015-10-15: qty 1
  Filled 2015-10-15 (×3): qty 2

## 2015-10-15 MED ORDER — PANTOPRAZOLE SODIUM 40 MG PO TBEC
40.0000 mg | DELAYED_RELEASE_TABLET | Freq: Every day | ORAL | Status: DC
  Administered 2015-10-16: 40 mg via ORAL
  Filled 2015-10-15: qty 1

## 2015-10-15 MED ORDER — MIDAZOLAM HCL 2 MG/2ML IJ SOLN
1.0000 mg | Freq: Once | INTRAMUSCULAR | Status: AC
  Administered 2015-10-15: 2 mg via INTRAVENOUS

## 2015-10-15 MED ORDER — TRAZODONE HCL 50 MG PO TABS
50.0000 mg | ORAL_TABLET | Freq: Every evening | ORAL | Status: DC | PRN
Start: 2015-10-15 — End: 2015-10-16
  Administered 2015-10-15: 50 mg via ORAL
  Filled 2015-10-15: qty 1

## 2015-10-15 MED ORDER — KETOROLAC TROMETHAMINE 30 MG/ML IJ SOLN
INTRAMUSCULAR | Status: AC
  Filled 2015-10-15: qty 1

## 2015-10-15 MED ORDER — MENTHOL 3 MG MT LOZG: 1.0000 | LOZENGE | OROMUCOSAL | Status: DC | PRN

## 2015-10-15 MED ORDER — BUPIVACAINE-EPINEPHRINE (PF) 0.25% -1:200000 IJ SOLN
INTRAMUSCULAR | Status: AC
  Filled 2015-10-15: qty 30

## 2015-10-15 MED ORDER — FENTANYL CITRATE (PF) 100 MCG/2ML IJ SOLN
INTRAMUSCULAR | Status: DC | PRN
  Administered 2015-10-15 (×4): 50 ug via INTRAVENOUS

## 2015-10-15 MED ORDER — ROCURONIUM BROMIDE 100 MG/10ML IV SOLN
INTRAVENOUS | Status: DC | PRN
  Administered 2015-10-15: 40 mg via INTRAVENOUS

## 2015-10-15 MED ORDER — METOPROLOL TARTRATE 100 MG PO TABS
100.0000 mg | ORAL_TABLET | Freq: Every morning | ORAL | Status: DC
  Administered 2015-10-16: 100 mg via ORAL
  Filled 2015-10-15: qty 1

## 2015-10-15 MED ORDER — MIDAZOLAM HCL 5 MG/5ML IJ SOLN
INTRAMUSCULAR | Status: DC | PRN
  Administered 2015-10-15: 1 mg via INTRAVENOUS

## 2015-10-15 MED ORDER — FENTANYL CITRATE (PF) 100 MCG/2ML IJ SOLN
50.0000 ug | INTRAMUSCULAR | Status: DC | PRN
  Administered 2015-10-15 (×3): 50 ug via INTRAVENOUS

## 2015-10-15 MED ORDER — PROPOFOL 10 MG/ML IV BOLUS
INTRAVENOUS | Status: DC | PRN
  Administered 2015-10-15: 150 mg via INTRAVENOUS

## 2015-10-15 MED ORDER — METHOCARBAMOL 500 MG PO TABS
ORAL_TABLET | ORAL | Status: AC
  Filled 2015-10-15: qty 1

## 2015-10-15 MED ORDER — SODIUM CHLORIDE 0.9 % IV SOLN: 1.0000 g | INTRAVENOUS | 1 refills | Status: DC

## 2015-10-15 MED ORDER — GLIPIZIDE 5 MG PO TABS
5.0000 mg | ORAL_TABLET | Freq: Every day | ORAL | Status: DC
  Administered 2015-10-16: 5 mg via ORAL
  Filled 2015-10-15: qty 1

## 2015-10-15 MED ORDER — METHOCARBAMOL 500 MG PO TABS
500.0000 mg | ORAL_TABLET | Freq: Three times a day (TID) | ORAL | Status: DC
  Administered 2015-10-15 – 2015-10-16 (×2): 500 mg via ORAL
  Filled 2015-10-15: qty 1

## 2015-10-15 MED ORDER — PROPOFOL 10 MG/ML IV BOLUS
INTRAVENOUS | Status: AC
  Filled 2015-10-15: qty 20

## 2015-10-15 MED ORDER — SODIUM CHLORIDE 0.9% FLUSH: 3.0000 mL | INTRAVENOUS | Status: DC | PRN

## 2015-10-15 MED ORDER — SIMVASTATIN 20 MG PO TABS
20.0000 mg | ORAL_TABLET | Freq: Every evening | ORAL | Status: DC
  Administered 2015-10-15: 20 mg via ORAL
  Filled 2015-10-15: qty 1

## 2015-10-15 MED ORDER — LISINOPRIL 40 MG PO TABS
40.0000 mg | ORAL_TABLET | Freq: Every morning | ORAL | Status: DC
  Administered 2015-10-16: 40 mg via ORAL
  Filled 2015-10-15: qty 1
  Filled 2015-10-15: qty 2

## 2015-10-15 MED ORDER — METOPROLOL TARTRATE 5 MG/5ML IV SOLN
INTRAVENOUS | Status: DC | PRN
  Administered 2015-10-15 (×2): 1 mg via INTRAVENOUS
  Administered 2015-10-15: 2 mg via INTRAVENOUS

## 2015-10-15 MED ORDER — OXYCODONE HCL 5 MG PO TABS
ORAL_TABLET | ORAL | Status: AC
  Filled 2015-10-15: qty 1

## 2015-10-15 MED ORDER — FUROSEMIDE 20 MG PO TABS: 20.0000 mg | ORAL_TABLET | Freq: Every day | ORAL | Status: DC

## 2015-10-15 MED ORDER — METOPROLOL TARTRATE 5 MG/5ML IV SOLN
INTRAVENOUS | Status: AC
  Filled 2015-10-15: qty 5

## 2015-10-15 MED ORDER — SODIUM CHLORIDE 0.9% FLUSH: 10.0000 mL | Freq: Two times a day (BID) | INTRAVENOUS | Status: DC

## 2015-10-15 MED ORDER — INSULIN ASPART 100 UNIT/ML ~~LOC~~ SOLN
0.0000 [IU] | SUBCUTANEOUS | Status: DC
  Administered 2015-10-16: 1 [IU] via SUBCUTANEOUS

## 2015-10-15 MED ORDER — ASPIRIN EC 81 MG PO TBEC
81.0000 mg | DELAYED_RELEASE_TABLET | Freq: Every day | ORAL | Status: DC
  Administered 2015-10-16: 81 mg via ORAL
  Filled 2015-10-15: qty 1

## 2015-10-15 MED ORDER — INSULIN LISPRO 100 UNIT/ML ~~LOC~~ SOLN: 2.0000 [IU] | Freq: Three times a day (TID) | SUBCUTANEOUS | Status: DC

## 2015-10-15 SURGICAL SUPPLY — 67 items
BAG DECANTER FOR FLEXI CONT (MISCELLANEOUS) ×2 IMPLANT
BUR EGG ELITE 4.0 (BURR) IMPLANT
BUR EGG ELITE 4.0MM (BURR)
CANISTER SUCTION 2500CC (MISCELLANEOUS) ×3 IMPLANT
CLOSURE WOUND 1/2 X4 (GAUZE/BANDAGES/DRESSINGS)
CORDS BIPOLAR (ELECTRODE) ×3 IMPLANT
COVER SURGICAL LIGHT HANDLE (MISCELLANEOUS) ×3 IMPLANT
DRAPE POUCH INSTRU U-SHP 10X18 (DRAPES) ×3 IMPLANT
DRAPE PROXIMA HALF (DRAPES) ×1 IMPLANT
DRAPE SURG 17X23 STRL (DRAPES) ×3 IMPLANT
DRAPE U-SHAPE 47X51 STRL (DRAPES) ×3 IMPLANT
DRSG ADAPTIC 3X8 NADH LF (GAUZE/BANDAGES/DRESSINGS) ×2 IMPLANT
DRSG AQUACEL AG ADV 3.5X10 (GAUZE/BANDAGES/DRESSINGS) ×1 IMPLANT
DRSG PAD ABDOMINAL 8X10 ST (GAUZE/BANDAGES/DRESSINGS) ×2 IMPLANT
DURAPREP 26ML APPLICATOR (WOUND CARE) ×1 IMPLANT
ELECT BLADE 4.0 EZ CLEAN MEGAD (MISCELLANEOUS)
ELECT CAUTERY BLADE 6.4 (BLADE) ×3 IMPLANT
ELECT PENCIL ROCKER SW 15FT (MISCELLANEOUS) ×3 IMPLANT
ELECT REM PT RETURN 9FT ADLT (ELECTROSURGICAL) ×3
ELECTRODE BLDE 4.0 EZ CLN MEGD (MISCELLANEOUS) IMPLANT
ELECTRODE REM PT RTRN 9FT ADLT (ELECTROSURGICAL) ×1 IMPLANT
EVACUATOR 1/8 PVC DRAIN (DRAIN) IMPLANT
GAUZE SPONGE 4X4 12PLY STRL (GAUZE/BANDAGES/DRESSINGS) ×2 IMPLANT
GLOVE BIO SURGEON STRL SZ 6.5 (GLOVE) ×2 IMPLANT
GLOVE BIO SURGEONS STRL SZ 6.5 (GLOVE) ×1
GLOVE BIOGEL PI IND STRL 6.5 (GLOVE) ×1 IMPLANT
GLOVE BIOGEL PI IND STRL 8.5 (GLOVE) ×1 IMPLANT
GLOVE BIOGEL PI INDICATOR 6.5 (GLOVE) ×2
GLOVE BIOGEL PI INDICATOR 8.5 (GLOVE) ×2
GLOVE SS BIOGEL STRL SZ 8.5 (GLOVE) ×1 IMPLANT
GLOVE SUPERSENSE BIOGEL SZ 8.5 (GLOVE) ×2
GOWN STRL REUS W/ TWL LRG LVL3 (GOWN DISPOSABLE) ×1 IMPLANT
GOWN STRL REUS W/TWL 2XL LVL3 (GOWN DISPOSABLE) ×6 IMPLANT
GOWN STRL REUS W/TWL LRG LVL3 (GOWN DISPOSABLE) ×3
KIT BASIN OR (CUSTOM PROCEDURE TRAY) ×3 IMPLANT
KIT ROOM TURNOVER OR (KITS) ×3 IMPLANT
NDL SPNL 18GX3.5 QUINCKE PK (NEEDLE) ×2 IMPLANT
NEEDLE 22X1 1/2 (OR ONLY) (NEEDLE) ×3 IMPLANT
NEEDLE SPNL 18GX3.5 QUINCKE PK (NEEDLE) IMPLANT
NS IRRIG 1000ML POUR BTL (IV SOLUTION) ×3 IMPLANT
PACK LAMINECTOMY ORTHO (CUSTOM PROCEDURE TRAY) ×3 IMPLANT
PACK UNIVERSAL I (CUSTOM PROCEDURE TRAY) ×3 IMPLANT
PAD ARMBOARD 7.5X6 YLW CONV (MISCELLANEOUS) ×6 IMPLANT
PATTIES SURGICAL .5 X.5 (GAUZE/BANDAGES/DRESSINGS) IMPLANT
PATTIES SURGICAL .5 X1 (DISPOSABLE) IMPLANT
SPONGE SURGIFOAM ABS GEL 100 (HEMOSTASIS) IMPLANT
STRIP CLOSURE SKIN 1/2X4 (GAUZE/BANDAGES/DRESSINGS) ×1 IMPLANT
SURGIFLO W/THROMBIN 8M KIT (HEMOSTASIS) IMPLANT
SUT BONE WAX W31G (SUTURE) ×1 IMPLANT
SUT ETHILON 2 0 FS 18 (SUTURE) ×4 IMPLANT
SUT ETHILON 3 0 FSL (SUTURE) ×2 IMPLANT
SUT ETHILON 3 0 PS 1 (SUTURE) ×2 IMPLANT
SUT MON AB 3-0 SH 27 (SUTURE)
SUT MON AB 3-0 SH27 (SUTURE) ×1 IMPLANT
SUT PDS AB 1 CT  36 (SUTURE) ×12
SUT PDS AB 1 CT 36 (SUTURE) IMPLANT
SUT VIC AB 0 CT1 27 (SUTURE)
SUT VIC AB 0 CT1 27XBRD ANBCTR (SUTURE) ×1 IMPLANT
SUT VIC AB 1 CTX 36 (SUTURE)
SUT VIC AB 1 CTX36XBRD ANBCTR (SUTURE) ×2 IMPLANT
SUT VIC AB 2-0 CT1 18 (SUTURE) ×1 IMPLANT
SYR BULB IRRIGATION 50ML (SYRINGE) ×3 IMPLANT
SYR CONTROL 10ML LL (SYRINGE) ×1 IMPLANT
TOWEL OR 17X24 6PK STRL BLUE (TOWEL DISPOSABLE) ×3 IMPLANT
TOWEL OR 17X26 10 PK STRL BLUE (TOWEL DISPOSABLE) ×3 IMPLANT
WATER STERILE IRR 1000ML POUR (IV SOLUTION) ×1 IMPLANT
YANKAUER SUCT BULB TIP NO VENT (SUCTIONS) ×3 IMPLANT

## 2015-10-15 NOTE — Anesthesia Procedure Notes (Addendum)
Procedure Name: Intubation Date/Time: 10/15/2015 3:29 PM Performed by: Trixie Deis A Pre-anesthesia Checklist: Patient identified, Emergency Drugs available, Suction available and Patient being monitored Patient Re-evaluated:Patient Re-evaluated prior to inductionOxygen Delivery Method: Circle System Utilized Preoxygenation: Pre-oxygenation with 100% oxygen Intubation Type: IV induction Ventilation: Mask ventilation without difficulty and Oral airway inserted - appropriate to patient size Laryngoscope Size: Mac and 3 Grade View: Grade I Tube type: Oral Number of attempts: 1 Airway Equipment and Method: Stylet and Oral airway Placement Confirmation: ETT inserted through vocal cords under direct vision,  positive ETCO2 and breath sounds checked- equal and bilateral Secured at: 22 cm Tube secured with: Tape Dental Injury: Teeth and Oropharynx as per pre-operative assessment  Comments: Atraumatic placement by SRNA.

## 2015-10-15 NOTE — Anesthesia Preprocedure Evaluation (Signed)
Anesthesia Evaluation  Patient identified by MRN, date of birth, ID band Patient awake    Reviewed: Allergy & Precautions, NPO status , Patient's Chart, lab work & pertinent test results, reviewed documented beta blocker date and time   History of Anesthesia Complications (+) PROLONGED EMERGENCENegative for: history of anesthetic complications  Airway Mallampati: II  TM Distance: >3 FB Neck ROM: Full    Dental  (+) Edentulous Upper, Dental Advisory Given   Pulmonary neg pulmonary ROS,    Pulmonary exam normal        Cardiovascular hypertension, Pt. on medications and Pt. on home beta blockers  Rhythm:Regular Rate:Normal     Neuro/Psych negative neurological ROS  negative psych ROS   GI/Hepatic Neg liver ROS, PUD, GERD  Medicated and Controlled,  Endo/Other  diabetes, Type 2  Renal/GU negative Renal ROS  negative genitourinary   Musculoskeletal  (+) Arthritis ,   Abdominal   Peds negative pediatric ROS (+)  Hematology negative hematology ROS (+)   Anesthesia Other Findings   Reproductive/Obstetrics negative OB ROS                             Lab Results  Component Value Date   WBC 6.0 10/15/2015   HGB 9.5 (L) 10/15/2015   HCT 30.8 (L) 10/15/2015   MCV 81.1 10/15/2015   PLT 328 10/15/2015   Lab Results  Component Value Date   CREATININE 0.80 10/15/2015   BUN 5 (L) 10/15/2015   NA 140 10/15/2015   K 3.7 10/15/2015   CL 104 10/15/2015   CO2 27 10/15/2015    Anesthesia Physical  Anesthesia Plan  ASA: III  Anesthesia Plan: General   Post-op Pain Management:    Induction: Intravenous  Airway Management Planned: Oral ETT  Additional Equipment:   Intra-op Plan:   Post-operative Plan: Extubation in OR  Informed Consent: I have reviewed the patients History and Physical, chart, labs and discussed the procedure including the risks, benefits and alternatives for the  proposed anesthesia with the patient or authorized representative who has indicated his/her understanding and acceptance.   Dental advisory given  Plan Discussed with:   Anesthesia Plan Comments:         Anesthesia Quick Evaluation

## 2015-10-15 NOTE — Brief Op Note (Signed)
10/15/2015  4:41 PM  PATIENT:  [name]  75 y.o. female  PRE-OPERATIVE DIAGNOSIS:  wound dehisence  POST-OPERATIVE DIAGNOSIS:  wound dehisence  PROCEDURE:  Procedure(s): [place] OUT AND CLOSURE OF BACK WOUND (N/A)  SURGEON:  Surgeon(s) and Role:    * Melina Schools, MD - Primary  PHYSICIAN ASSISTANT:   ASSISTANTS: none   ANESTHESIA:   general  EBL:  Total I/O In: 900 [I.V.:900] Out: -   BLOOD ADMINISTERED:none  DRAINS: none   LOCAL MEDICATIONS USED:  NONE  SPECIMEN:  No Specimen  DISPOSITION OF SPECIMEN:  N/A  COUNTS:  YES  TOURNIQUET:  * No tourniquets in log *  DICTATION: .Other Dictation: Dictation Number [id number]  PLAN OF CARE: Admit for overnight observation  PATIENT DISPOSITION:  PACU - hemodynamically stable.

## 2015-10-15 NOTE — Transfer of Care (Signed)
Immediate Anesthesia Transfer of Care Note  Patient: Nancy [name]  Procedure(s) Performed: Procedure(s): [employer] OUT AND CLOSURE OF BACK WOUND (N/A)  Patient Location: PACU  Anesthesia Type:General  Level of Consciousness: awake, alert  and oriented  Airway & Oxygen Therapy: Patient Spontanous Breathing and Patient connected to nasal cannula oxygen  Post-op Assessment: Report given to RN, Post -op Vital signs reviewed and stable and Patient moving all extremities  Post vital signs: Reviewed and stable  Last Vitals:  Vitals:   10/15/15 1223  BP: (!) 136/53  Pulse: 69  Resp: 18  Temp: 37.1 C    Last Pain:  Vitals:   10/15/15 1223  TempSrc: Oral         Complications: No apparent anesthesia complications

## 2015-10-15 NOTE — Progress Notes (Signed)
Patient arrived to 5M18 from PACU. Safety precautions and orders reviewed with patient/family. VSS. PT denied pain and refusal of meds a this time. Pt noted tearful but per PACU RN, pt has been uncontrollable teary for the last 4 hours. Dght at bedside with update. Will continue to monitor.  Ave Filter, RN

## 2015-10-15 NOTE — Discharge Instructions (Signed)
1. Once a day bulky dry dressing change 2. IV antibiotics q24 hrs  3. Ambulate with assistance at least 3 times a day 4. Arrange follow up next week in office for wound check 5. PICC line care per protocol/routine

## 2015-10-16 ENCOUNTER — Encounter (HOSPITAL_COMMUNITY): Payer: Self-pay | Admitting: Orthopedic Surgery

## 2015-10-16 ENCOUNTER — Telehealth: Payer: Self-pay | Admitting: Podiatry

## 2015-10-16 DIAGNOSIS — T814XXA Infection following a procedure, initial encounter: Secondary | ICD-10-CM | POA: Diagnosis not present

## 2015-10-16 LAB — GLUCOSE, CAPILLARY
GLUCOSE-CAPILLARY: 114 mg/dL — AB (ref 65–99)
GLUCOSE-CAPILLARY: 121 mg/dL — AB (ref 65–99)
Glucose-Capillary: 108 mg/dL — ABNORMAL HIGH (ref 65–99)
Glucose-Capillary: 85 mg/dL (ref 65–99)

## 2015-10-16 MED ORDER — HEPARIN SOD (PORK) LOCK FLUSH 100 UNIT/ML IV SOLN
250.0000 [IU] | INTRAVENOUS | Status: AC | PRN
  Administered 2015-10-16: 250 [IU]

## 2015-10-16 MED ORDER — SODIUM CHLORIDE 0.9 % IV SOLN
1.0000 g | INTRAVENOUS | Status: DC
  Filled 2015-10-16: qty 1

## 2015-10-16 NOTE — Discharge Summary (Signed)
Physician Discharge Summary  Patient ID: Nancy [name] MRN: [id number] DOB/AGE: Oct 10, 1940 75 y.o.  Admit date: 10/15/2015 Discharge date: 10/16/2015  Admission Diagnoses:  Post- op wound infection  Discharge Diagnoses:  Active Problems:   Wound infection   Past Medical History:  Diagnosis Date  . Breast cyst    left breast  . Chest pain   . Complication of anesthesia 1986   slow to awaken after cholecystectomy  . Diastolic dysfunction    with mild pulmonary HTN by echo 07/2011  . Gastric ulcer   . GERD (gastroesophageal reflux disease)   . Gout   . Hyperlipidemia   . Hypertension   . Neuropathy ([place])    from diabetes per patient  . Osteoarthritis   . Osteopenia   . Shortness of breath dyspnea    with activity  . Spine deformity    lower spine  . Type II diabetes mellitus ([place])     Surgeries: Procedure(s): [facility] OUT AND CLOSURE OF BACK WOUND on 10/15/2015   Consultants (if any):   Discharged Condition: Improved  Hospital Course: Nancy [name] is an 75 y.o. female who was admitted 10/15/2015 with a diagnosis of Post op wound infection and went to the operating room on 10/15/2015 and underwent the above named procedures.  The wound vac was removed and the pts incision was resutured. Post op day one pt still reports decreased leg pain.  She reports incisional pain that is controlled on oral medication.  Pt reports urinating w/o difficulty.  Pt agreed with the pt returning to SNF for continued rehab after DC.   She was given perioperative antibiotics:  Anti-infectives    Start     Dose/Rate Route Frequency Ordered Stop   10/16/15 1500  ertapenem [facility]) injection 1 g  Status:  Discontinued     1 g Intramuscular Every 24 hours 10/15/15 2004 10/16/15 1301   10/16/15 1500  ertapenem (INVANZ) 1 g in sodium chloride 0.9 % 50 mL IVPB     1 g 100 mL/hr over 30 Minutes Intravenous Every 24 hours 10/16/15 1301     10/15/15 1556  polymyxin B 500,000 Units, bacitracin  50,000 Units in sodium chloride irrigation 0.9 % 500 mL irrigation  Status:  Discontinued       As needed 10/15/15 1556 10/15/15 1647   10/15/15 1556  vancomycin (VANCOCIN) powder  Status:  Discontinued       As needed 10/15/15 1557 10/15/15 1647   10/15/15 1300  ertapenem (INVANZ) 1 g in sodium chloride 0.9 % 50 mL IVPB     1 g 100 mL/hr over 30 Minutes Intravenous To [facility]Stay [facility] 10/14/15 1429 10/15/15 1608   10/15/15 0700  ertapenem (INVANZ) 1 g in sodium chloride 0.9 % 50 mL IVPB  Status:  Discontinued     1 g 100 mL/hr over 30 Minutes Intravenous Every 24 hours 10/14/15 1333 10/14/15 1812   10/15/15 0700  ertapenem (INVANZ) 1 g in sodium chloride 0.9 % 50 mL IVPB  Status:  Discontinued     1 g 100 mL/hr over 30 Minutes Intravenous To ShortStay Surgical 10/14/15 1338 10/14/15 1427   10/15/15 0700  ertapenem (INVANZ) 1 g in sodium chloride 0.9 % 50 mL IVPB  Status:  Discontinued     1 g 100 mL/hr over 30 Minutes Intravenous To ShortStay Surgical 10/14/15 1428 10/14/15 1808   10/15/15 0000  ertapenem 1 g in sodium chloride 0.9 % 50 mL     1 g 100  mL/hr over 30 Minutes Intravenous Every 24 hours 10/15/15 1656      .  She was given sequential compression devices, early ambulation, and TED for DVT prophylaxis.  She benefited maximally from the hospital stay and there were no complications.    Recent vital signs:  Vitals:   10/16/15 0500 10/16/15 1046  BP: 140/73 (!) 114/59  Pulse: 86 81  Resp: 18 16  Temp: 98.7 F (37.1 C) 98.6 F (37 C)    Recent laboratory studies:  Lab Results  Component Value Date   HGB 9.5 (L) 10/15/2015   HGB 8.5 (L) 10/08/2015   HGB 7.0 (L) 10/06/2015   Lab Results  Component Value Date   WBC 6.0 10/15/2015   PLT 328 10/15/2015   Lab Results  Component Value Date   INR 1.18 10/01/2015   Lab Results  Component Value Date   NA 140 10/15/2015   K 3.7 10/15/2015   CL 104 10/15/2015   CO2 27 10/15/2015   BUN 5 (L) 10/15/2015    CREATININE 0.80 10/15/2015   GLUCOSE 102 (H) 10/15/2015    Discharge Medications:     Medication List    TAKE these medications   allopurinol 100 MG tablet Commonly known as:  ZYLOPRIM Take 200 mg by mouth daily.   amLODipine 10 MG tablet Commonly known as:  NORVASC Take 10 mg by mouth every morning.   aspirin EC 81 MG tablet Take 81 mg by mouth daily.   COLCRYS 0.6 MG tablet Generic drug:  colchicine Take 0.6 mg by mouth daily.   ertapenem 1 g in sodium chloride 0.9 % 50 mL Inject 1 g into the vein daily.   ERTAPENEM SODIUM IV Inject 50 mLs into the vein daily.   ferrous sulfate 325 (65 FE) MG tablet Take 1 tablet (325 mg total) by mouth 3 (three) times daily with meals.   furosemide 20 MG tablet Commonly known as:  LASIX TAKE ONE TABLET BY MOUTH ONCE DAILY   glipiZIDE 5 MG tablet Commonly known as:  GLUCOTROL Take 5 mg by mouth daily before breakfast.   insulin aspart 100 UNIT/ML injection Commonly known as:  novoLOG Inject 0-5 Units into the skin at bedtime.   insulin lispro 100 UNIT/ML injection Commonly known as:  HUMALOG Inject 2-14 Units into the skin 3 (three) times daily before meals. 200-250= 2 units, 251-300= 4 units, 301-350= 6 units, 351-400= 8 units, 401-450= 12 units; IF HIGH GIVE 14 UNITS RECHECK IN 2 HOURS IF >400 /<100 CALL MD   lisinopril 40 MG tablet Commonly known as:  PRINIVIL,ZESTRIL Take 40 mg by mouth every morning.   metFORMIN 1000 MG tablet Commonly known as:  GLUCOPHAGE Take 1,000 mg by mouth 2 (two) times daily with a meal.   methocarbamol 500 MG tablet Commonly known as:  ROBAXIN Take 1 tablet (500 mg total) by mouth 3 (three) times daily.   metoprolol 100 MG tablet Commonly known as:  LOPRESSOR Take 100 mg by mouth every morning.   omeprazole 20 MG capsule Commonly known as:  PRILOSEC Take 20 mg by mouth daily.   ondansetron 4 MG tablet Commonly known as:  ZOFRAN Take 4 mg by mouth every 8 (eight) hours as needed  for nausea or vomiting.   oxyCODONE-acetaminophen 5-325 MG tablet Commonly known as:  PERCOCET/ROXICET Take 1-2 tablets by mouth every 4 (four) hours as needed for moderate pain.   simvastatin 20 MG tablet Commonly known as:  ZOCOR Take 20 mg by mouth  every evening.   SYSTANE OP Apply 1 drop to eye as needed (Dry eyes).   traZODone 50 MG tablet Commonly known as:  DESYREL Take 50-100 mg by mouth at bedtime as needed for sleep.       Diagnostic Studies: Dg Lumbar Spine 2-3 Views  Result Date: 09/18/2015 CLINICAL DATA:  75 year old female with a history of postop lumbar surgery. EXAM: LUMBAR SPINE - 2-3 VIEW COMPARISON:  Plain film 09/17/2015, CT 12/14/2012 FINDINGS: Surgical changes of posterior lumbar interbody fusion with bilateral pedicle screw and rod fixation spanning L4-S1. There is unilateral pedicle screw on the right at L5. Interbody spacer in place after discectomy of L4-L5. Alignment relatively maintained, with unchanged trace anterolisthesis of L4 on L5. Disc space narrowing with vacuum disc phenomenon at L5-S1 is unchanged. No complicating features. No fracture identified. IMPRESSION: Status post posterior lumbar interbody fusion spanning L4-S1 with unilateral right L5 pedicle screw and interbody spacer at L4-L5. No complicating features. Signed, Dulcy Fanny. Earleen Newport, DO [facility] Electronically Signed   By: Corrie Mckusick D.O.   On: 09/18/2015 08:55   Dg Lumbar Spine 2-3 Views  Result Date: 09/17/2015 CLINICAL DATA:  75 year old female undergoing lumbar surgery. Initial encounter. EXAM: DG C-ARM GT 120 MIN; LUMBAR SPINE - 2-3 VIEW FLUOROSCOPY TIME:  Fluoroscopy Time:  3 minutes 25 seconds Radiation Exposure Index (if provided by the fluoroscopic device): Number of Acquired Spot Images: None COMPARISON:  CT Abdomen and Pelvis 12/14/2012 FINDINGS: Normal lumbar segmentation demonstrated on the 2014 comparison. Four  intraoperative fluoroscopic images of the lower lumbar spine are provided. These demonstrate sequelae of transpedicular fusion hardware placement bilaterally at L4, on the right at L5, and bilaterally at S1. There is an L4-L5 interbody implant. Grade 1 anterolisthesis Re demonstrated at L4-L5. IMPRESSION: Postoperative changes at L4-L5 and L5-S1. Electronically Signed   By: Genevie Ann M.D.   On: 09/17/2015 13:55   Ct Lumbar Spine Wo Contrast  Result Date: 10/06/2015 CLINICAL DATA:  75 y/o  F; severe lumbar spinal pain after fusion. EXAM: CT LUMBAR SPINE WITHOUT CONTRAST TECHNIQUE: Multidetector CT imaging of the lumbar spine was performed without intravenous contrast administration. Multiplanar CT image reconstructions were also generated. COMPARISON:  09/18/2015 lumbar spine radiographs. CT abdomen and pelvis 12/14/2012. FINDINGS: Segmentation: 5 lumbar type vertebrae. Alignment: Lumbar lordosis is maintained. Grade 1 L4-5 anterolisthesis. Vertebrae: Status post posterior instrumented fusion of L4 through S1, left-sided laminectomies, L4-5 foraminotomy and facetectomy. Hardware includes vertical rods fixed by 3 right-sided transpedicular screws and left-sided L4 and S1 transpedicular screws. There is and interbody fusion prosthesis at L4-5. There is a linear tract traversing the left L5 pedicle probably representing sequelae of instrumentation. Vertebral bodies of levels of fusion are partially obscured by streak artifact. Left-sided L5-S1 posterior element morselized bone graft material noted. Paraspinal and other soft tissues: There are open wounds within the lumbar subcutaneous fat consistent with history of recent debridement. There is edema throughout the surrounding soft tissues. Disc levels: Prominent disc bulge at the L3-4 level. Severe disc space narrowing at L5-S1 with endplate degenerative changes. IMPRESSION: 1. Posterior instrumented fusion of L4 through S1. Hardware is intact and no acute hardware  related complication is identified. 2. Open wounds within the subcutaneous fat at the L2-3 level and edema throughout paraspinal muscles at levels of fusion. No large discrete fluid collection is identified on this noncontrast study, suboptimal assessment without intravenous contrast. Electronically Signed   By: Kristine Garbe M.D.   On:  10/06/2015 16:02   Dg C-arm Gt 120 Min  Result Date: 09/17/2015 CLINICAL DATA:  75 year old female undergoing lumbar surgery. Initial encounter. EXAM: DG C-ARM GT 120 MIN; LUMBAR SPINE - 2-3 VIEW FLUOROSCOPY TIME:  Fluoroscopy Time:  3 minutes 25 seconds Radiation Exposure Index (if provided by the fluoroscopic device): Number of Acquired Spot Images: None COMPARISON:  CT Abdomen and Pelvis 12/14/2012 FINDINGS: Normal lumbar segmentation demonstrated on the 2014 comparison. Four intraoperative fluoroscopic images of the lower lumbar spine are provided. These demonstrate sequelae of transpedicular fusion hardware placement bilaterally at L4, on the right at L5, and bilaterally at S1. There is an L4-L5 interbody implant. Grade 1 anterolisthesis Re demonstrated at L4-L5. IMPRESSION: Postoperative changes at L4-L5 and L5-S1. Electronically Signed   By: Genevie Ann M.D.   On: 09/17/2015 13:55    Disposition: 03-Skilled Nursing Facility Pt should present back to Dr. Rolena Infante clinic on 10/10 for wound check Pt will continue IV anx for post op wound infection Pt will be discharged back to SNF to continue rehab   Follow-up Information    BROOKS,DAHARI D, MD In 1 week.   Specialty:  Orthopedic Surgery Why:  For wound re-check Contact information: [address] [place] [address] [id number]            Signed: Valinda Hoar 10/16/2015, 1:12 PM

## 2015-10-16 NOTE — Evaluation (Signed)
Occupational Therapy Evaluation and Discharge Patient Details Name: Nancy [name] MRN: [id number] DOB: 1940-04-06 Today's Date: 10/16/2015    History of Present Illness Pt is a 75 y.o. female s/p s/p L4-5 TLIF, posterior fusion L5-S1 on 09/17/15. Presenting with wound healing complications s/p debridement on 10/01/15, wound vac on 10/02/15, and washout and closure on 10/15/15. PMHx: Chest pain, Diastolic dysfunction, GERD, Gout, Hyperlipidemia, HTN, DM 2.   Clinical Impression   Pt reports she has been independent with ADL and participating in functional mobility with use of RW since she has been at rehab PTA. Currently pt requires min guard assist for grooming, toilet transfers, and peri care with increased time and verbal cues. Pt able to verbally recall back precautions but requires consistent verbal cueing to maintain during functional activities. Pt planning to return to [facility] upon d/c for continued rehab; agree with SNF placement. Will defer all further OT needs to next venue. Please re-consult if needs change.     Follow Up Recommendations  SNF;Supervision/Assistance - 24 hour    Equipment Recommendations  Other (comment) (TBD at next venue)    Recommendations for Other Services       Precautions / Restrictions Precautions Precautions: Back;Fall Precaution Booklet Issued: No Precaution Comments: Pt able to verbally recall precautions. Required Braces or Orthoses: Spinal Brace Spinal Brace: Lumbar corset;Applied in sitting position Restrictions Weight Bearing Restrictions: No      Mobility Bed Mobility               General bed mobility comments: Pt OOB in chair upon arrival.  Transfers Overall transfer level: Needs assistance Equipment used: Rolling walker (2 wheeled) Transfers: Sit to/from Stand Sit to Stand: Min guard         General transfer comment: Increased time required. Good hand placement and technique.    Balance Overall balance  assessment: Needs assistance Sitting-balance support: Feet supported;No upper extremity supported Sitting balance-Leahy Scale: Fair     Standing balance support: No upper extremity supported;During functional activity Standing balance-Leahy Scale: Poor                              ADL Overall ADL's : Needs assistance/impaired Eating/Feeding: Set up;Sitting   Grooming: Min guard;Standing;Wash/dry Geophysical data processor Transfer: Min guard;Ambulation;BSC;RW Toilet Transfer Details (indicate cue type and reason): Very slow movements. Cues for keeping eyes open to not run into objects in room. Toileting- Water quality scientist and Hygiene: Min guard;Sit to/from stand Toileting - Clothing Manipulation Details (indicate cue type and reason): for peri care. Cues for no bending.     Functional mobility during ADLs: Min guard;Rolling walker General ADL Comments: Pt plan to return to SNF upon d/c; able to verbally recall precautions but needs cues to maintain during functional activities.     Vision Vision Assessment?: No apparent visual deficits   Perception     Praxis      Pertinent Vitals/Pain Pain Assessment: Faces Faces Pain Scale: Hurts whole lot Pain Location: back Pain Descriptors / Indicators: Grimacing;Spasm Pain Intervention(s): Monitored during session;Patient requesting pain meds-RN notified     Hand Dominance Right   Extremity/Trunk Assessment Upper Extremity Assessment Upper Extremity Assessment: Overall WFL for tasks assessed   Lower Extremity Assessment Lower Extremity Assessment: Defer to PT evaluation   Cervical / Trunk Assessment Cervical / Trunk Assessment: Other exceptions Cervical /  Trunk Exceptions: s/p spinal sx   Communication Communication Communication: No difficulties   Cognition Arousal/Alertness: Awake/alert Behavior During Therapy: WFL for tasks assessed/performed Overall Cognitive Status: Within Functional  Limits for tasks assessed                 General Comments: Eyes closed throughout. Constant cues for opening eyes.   General Comments       Exercises       Shoulder Instructions      Home Living Family/patient expects to be discharged to:: Skilled nursing facility                                 Additional Comments: Return to [facility]      Prior Functioning/Environment          Comments: Per daughter, pt has been ambulatory with use of RW and completing BADL independently at [facility] PTA.        OT Problem List:     OT Treatment/Interventions:      OT Goals(Current goals can be found in the care plan section) Acute Rehab OT Goals Patient Stated Goal: rehab before home OT Goal Formulation: All assessment and education complete, DC therapy  OT Frequency:     Barriers to D/C:            Co-evaluation              End of Session Equipment Utilized During Treatment: Rolling walker;Back brace Nurse Communication: Mobility status;Patient requests pain meds  Activity Tolerance: Patient limited by pain Patient left: in chair;with call bell/phone within reach;with chair alarm set;with family/visitor present   Time: [id number] OT Time Calculation (min): 18 min Charges:  OT General Charges $OT Visit: 1 Procedure OT Evaluation $OT Eval Moderate Complexity: 1 Procedure G-Codes: OT G-codes **NOT FOR INPATIENT CLASS** Functional Assessment Tool Used: Clinical judgement Functional Limitation: Self care Self Care Current Status [id number]): At least 1 percent but less than 20 percent impaired, limited or restricted Self Care Goal Status [id number]): At least 1 percent but less than 20 percent impaired, limited or restricted Self Care Discharge Status [phone]): At least 1 percent but less than 20 percent impaired, limited or restricted    Binnie Kand M.S., OTR/L Pager: [phone]  10/16/2015, 9:19 AM

## 2015-10-16 NOTE — Care Management Obs Status (Signed)
[place] NOTIFICATION   Patient Details  Name: Nancy [name] MRN: [id number] Date of Birth: 24-Aug-1940   Medicare Observation Status Notification Given:  Yes    Pollie Friar, RN 10/16/2015, 11:12 AM

## 2015-10-16 NOTE — Anesthesia Postprocedure Evaluation (Signed)
Anesthesia Post Note  Patient: Nancy [name]  Procedure(s) Performed: Procedure(s) (LRB): [employer] OUT AND CLOSURE OF BACK WOUND (N/A)  Patient location during evaluation: PACU Anesthesia Type: General Level of consciousness: awake and alert Pain management: pain level controlled Vital Signs Assessment: post-procedure vital signs reviewed and stable Respiratory status: spontaneous breathing, nonlabored ventilation and respiratory function stable Cardiovascular status: blood pressure returned to baseline and stable Postop Assessment: no signs of nausea or vomiting Anesthetic complications: no    Last Vitals:  Vitals:   10/16/15 0500 10/16/15 1046  BP: 140/73 (!) 114/59  Pulse: 86 81  Resp: 18 16  Temp: 37.1 C 37 C    Last Pain:  Vitals:   10/16/15 1046  TempSrc: Oral  PainSc:                  [name]

## 2015-10-16 NOTE — NC FL2 (Signed)
[place] LEVEL OF CARE SCREENING TOOL     IDENTIFICATION  Patient Name: Nancy [name] Birthdate: November 23, 1940 Sex: female Admission Date (Current Location): 10/15/2015  [facility] and [place] Number:  Herbalist and Address:  [facility], [place] [address] [place], [place] [address]      Provider Number: [id number]  Attending Physician Name and Address:  Melina Schools, MD  Relative Name and Phone Number:       Current Level of Care: Hospital Recommended Level of Care: [place] Prior Approval Number:    Date Approved/Denied:   PASRR Number: [id number]  Discharge Plan: SNF    Current Diagnoses: Patient Active Problem List   Diagnosis Date Noted  . Difficulty in walking, not elsewhere classified   . 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 Uncontrolled type 2 diabetes mellitus with complication ([place])   . 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 Angioedema   . Gout 10/01/2015  . Hyperlipidemia 10/01/2015  . Type 2 diabetes mellitus ([place]) 10/01/2015  . Wound infection 10/01/2015  . Back pain 09/17/2015  . Gait instability 02/19/2013  . Pulmonary hyperinflation 01/17/2013  . Diastolic dysfunction   . Essential hypertension, benign 01/03/2013  . Infected sebaceous cyst 12/23/2010    Orientation RESPIRATION BLADDER Height & Weight     Self, Time, Situation, Place  Normal Incontinent Weight: 173 lb 4.5 oz (78.6 kg) Height:  5\' 2"  (157.5 cm)  BEHAVIORAL SYMPTOMS/MOOD NEUROLOGICAL BOWEL NUTRITION STATUS      Continent Diet  AMBULATORY STATUS COMMUNICATION OF NEEDS Skin   Limited Assist Verbally Normal                       Personal Care Assistance Level of Assistance  Bathing, Feeding, Dressing Bathing Assistance: Limited assistance Feeding assistance: Independent Dressing Assistance: Limited assistance     Functional Limitations Info  Hearing, Speech, Sight Sight Info: Adequate Hearing  Info: Adequate Speech Info: Adequate    SPECIAL CARE FACTORS FREQUENCY  PT (By licensed PT), OT (By licensed OT)     PT Frequency: 5 OT Frequency: 5            Contractures Contractures Info: Not present    Additional Factors Info  Code Status, Allergies Code Status Info: Full Code Allergies Info:  Morphine And Related, Toradol Ketorolac Tromethamine, Codeine   Insulin Sliding Scale Info: 4x/day Isolation Precautions Info: Contact:  ESBL, MRSA     Current Medications (10/16/2015):  This is the current hospital active medication list Current Facility-Administered Medications  Medication Dose Route Frequency Provider Last Rate Last Dose  . 0.9 %  sodium chloride infusion  250 mL Intravenous Continuous Melina Schools, MD   Stopped at 10/15/15 2019  . allopurinol (ZYLOPRIM) tablet 200 mg  200 mg Oral Daily Melina Schools, MD   200 mg at 10/16/15 0907  . amLODipine (NORVASC) tablet 10 mg  10 mg Oral q morning - 10a Melina Schools, MD   10 mg at 10/16/15 0907  . aspirin EC tablet 81 mg  81 mg Oral Daily Melina Schools, MD   81 mg at 10/16/15 0907  . ertapenem [facility]) injection 1 g  1 g Intramuscular Q24H Melina Schools, MD      . ferrous sulfate tablet 325 mg  325 mg Oral TID WC Melina Schools, MD   325 mg at 10/16/15 0803  . furosemide (LASIX) tablet 20 mg  20 mg Oral Daily  Melina Schools, MD      . glipiZIDE (GLUCOTROL) tablet 5 mg  5 mg Oral QAC breakfast Melina Schools, MD   5 mg at 10/16/15 0803  . insulin aspart (novoLOG) injection 0-15 Units  0-15 Units Subcutaneous Q4H Melina Schools, MD      . insulin aspart (novoLOG) injection 0-5 Units  0-5 Units Subcutaneous QHS Melina Schools, MD      . lactated ringers infusion   Intravenous Continuous Melina Schools, MD 85 mL/hr at 10/15/15 2054    . lisinopril (PRINIVIL,ZESTRIL) tablet 40 mg  40 mg Oral q morning - 10a Melina Schools, MD   40 mg at 10/16/15 0907  . menthol-cetylpyridinium (CEPACOL) lozenge 3 mg  1 lozenge Oral PRN Melina Schools, MD       Or  . phenol (CHLORASEPTIC) mouth spray 1 spray  1 spray Mouth/Throat PRN Melina Schools, MD      . metFORMIN (GLUCOPHAGE) tablet 1,000 mg  1,000 mg Oral BID WC Melina Schools, MD   1,000 mg at 10/16/15 0803  . methocarbamol (ROBAXIN) tablet 500 mg  500 mg Oral TID Melina Schools, MD   500 mg at 10/16/15 0909  . metoprolol (LOPRESSOR) tablet 100 mg  100 mg Oral q morning - 10a Melina Schools, MD   100 mg at 10/16/15 0907  . ondansetron (ZOFRAN) injection 4 mg  4 mg Intravenous Q4H PRN Melina Schools, MD      . ondansetron [facility]) tablet 4 mg  4 mg Oral Q8H PRN Melina Schools, MD      . oxyCODONE-acetaminophen (PERCOCET/ROXICET) 5-325 MG per tablet 1-2 tablet  1-2 tablet Oral Q4H PRN Melina Schools, MD   2 tablet at 10/16/15 0908  . pantoprazole (PROTONIX) EC tablet 40 mg  40 mg Oral Daily Melina Schools, MD   40 mg at 10/16/15 0907  . simvastatin (ZOCOR) tablet 20 mg  20 mg Oral QPM Melina Schools, MD   20 mg at 10/15/15 2053  . sodium chloride flush (NS) 0.9 % injection 10-40 mL  10-40 mL Intracatheter Q12H Melina Schools, MD      . sodium chloride flush (NS) 0.9 % injection 10-40 mL  10-40 mL Intracatheter PRN Melina Schools, MD   10 mL at 10/16/15 0819  . sodium chloride flush (NS) 0.9 % injection 3 mL  3 mL Intravenous Q12H Melina Schools, MD      . sodium chloride flush (NS) 0.9 % injection 3 mL  3 mL Intravenous PRN Melina Schools, MD      . traZODone (DESYREL) tablet 50-100 mg  50-100 mg Oral QHS PRN Melina Schools, MD   50 mg at 10/15/15 2053     Discharge Medications: Please see discharge summary for a list of discharge medications.  Relevant Imaging Results:  Relevant Lab Results:   Additional Information SSN:  [id number]  Darden Dates, LCSW

## 2015-10-16 NOTE — Progress Notes (Signed)
Patient is being d/c to a skilled nursing facility. Report called to  [facility].

## 2015-10-16 NOTE — Telephone Encounter (Signed)
Spoke to the patient to schedule an appointment for diabetic shoe measurement. She stated she is currently in the hospital and will be released to rehab for 1 to 2 weeks. She will have her daughter call to schedule the appointment when she is close to getting out of rehab.

## 2015-10-16 NOTE — Evaluation (Signed)
Physical Therapy Evaluation Patient Details Name: Nancy [name] MRN: [id number] DOB: February 28, 1940 Today's Date: 10/16/2015   History of Present Illness  Pt is a 75 y.o. female s/p s/p L4-5 TLIF, posterior fusion L5-S1 on 09/17/15. Presenting with wound healing complications s/p debridement on 10/01/15, wound vac on 10/02/15, and washout and closure on 10/15/15. PMHx: Chest pain, Diastolic dysfunction, GERD, Gout, Hyperlipidemia, HTN, DM 2.  Clinical Impression  Pt [facility]min[facility] to get another couple of weeks of therapy before d/c home.  Pt still needs to integrate the education into her mobility-- she loves to "skirt around the "rules".    Follow Up Recommendations SNF    Equipment Recommendations  None recommended by PT    Recommendations for Other Services       Precautions / Restrictions Precautions Precautions: Back;Fall Precaution Booklet Issued: No Precaution Comments: Pt able to verbally recall precautions. Required Braces or Orthoses: Spinal Brace Spinal Brace: Lumbar corset;Applied in sitting position Restrictions Weight Bearing Restrictions: No      Mobility  Bed Mobility               General bed mobility comments: Pt OOB in chair upon arrival.  Transfers Overall transfer level: Needs assistance Equipment used: Rolling walker (2 wheeled) Transfers: Sit to/from [facility] Sit to Stand: Min guard Stand pivot transfers: Min guard       General transfer comment: Increased time required. Good hand placement and technique.  Ambulation/Gait Ambulation/Gait assistance: Supervision Ambulation Distance (Feet): 250 Feet Assistive device: Rolling walker (2 wheeled) Gait Pattern/deviations: Step-through pattern   Gait velocity interpretation: Below normal speed for age/gender General Gait Details: steady with good upright posture  Stairs            Wheelchair Mobility    Modified Rankin (Stroke Patients Only)        Balance Overall balance assessment: No apparent balance deficits (not formally assessed)   Sitting balance-Leahy Scale: Fair       Standing balance-Leahy Scale: Fair                               Pertinent Vitals/Pain Pain Assessment: 0-10 Pain Score: 5  Faces Pain Scale: Hurts whole lot Pain Location: back Pain Descriptors / Indicators: Grimacing;Sore Pain Intervention(s): Monitored during session;Repositioned    Home Living Family/patient expects to be discharged to:: Skilled nursing facility                 Additional Comments: Return to [facility]    Prior Function           Comments: Per daughter, pt has been ambulatory with use of RW and completing BADL independently at [facility].     Hand Dominance   Dominant Hand: Right    Extremity/Trunk Assessment   Upper Extremity Assessment: Overall WFL for tasks assessed           Lower Extremity Assessment: Overall WFL for tasks assessed (proximal weakness)      Cervical / Trunk Assessment: Other exceptions  Communication   Communication: No difficulties  Cognition Arousal/Alertness: Awake/alert Behavior During Therapy: WFL for tasks assessed/performed Overall Cognitive Status: Within Functional Limits for tasks assessed                      General Comments General comments (skin integrity, edema, etc.): Reiviewed all education    Exercises     Assessment/Plan  PT Assessment Patient needs continued PT services  PT Problem List Decreased strength;Decreased activity tolerance;Decreased mobility;Decreased knowledge of use of DME;Decreased knowledge of precautions;Pain          PT Treatment Interventions      PT Goals (Current goals can be found in the Care Plan section)  Acute Rehab PT Goals Patient Stated Goal: rehab before home PT Goal Formulation: With patient    Frequency     Barriers to discharge        Co-evaluation                End of Session Equipment Utilized During Treatment: Back brace (pt loves to take it off when it hurts, explained why not to.) Activity Tolerance: Patient tolerated treatment well Patient left: in chair;with call bell/phone within reach;with family/visitor present;with chair alarm set Nurse Communication: Mobility status    Functional Assessment Tool Used: clinical judgement Functional Limitation: Mobility: Walking and moving around Mobility: Walking and Moving Around Current Status [id number]): At least 1 percent but less than 20 percent impaired, limited or restricted Mobility: Walking and Moving Around Goal Status [phone]): At least 1 percent but less than 20 percent impaired, limited or restricted Mobility: Walking and Moving Around Discharge Status [phone]): At least 1 percent but less than 20 percent impaired, limited or restricted    Time: 1344-1418 PT Time Calculation (min) (ACUTE ONLY): 34 min   Charges:   PT Evaluation $PT Eval Low Complexity: 1 Procedure PT Treatments $Gait Training: 8-22 mins   PT G Codes:   PT G-Codes **NOT FOR INPATIENT CLASS** Functional Assessment Tool Used: clinical judgement Functional Limitation: Mobility: Walking and moving around Mobility: Walking and Moving Around Current Status [id number]): At least 1 percent but less than 20 percent impaired, limited or restricted Mobility: Walking and Moving Around Goal Status [phone]): At least 1 percent but less than 20 percent impaired, limited or restricted Mobility: Walking and Moving Around Discharge Status [phone]): At least 1 percent but less than 20 percent impaired, limited or restricted    Elton Catalano, Tessie Fass 10/16/2015, 2:30 PM 10/16/2015  Donnella Sham, PT [phone]  (pager)

## 2015-10-16 NOTE — Clinical Social Work Note (Signed)
Clinical Social Work Assessment  Patient Details  Name: Nancy [name] MRN: [id number] Date of Birth: 1940/10/15  Date of referral:  10/16/15               Reason for consult:  Facility Placement                Permission sought to share information with:  Family Supports Permission granted to share information::  Yes, Verbal Permission Granted  Name::     Nancy [name]  Relationship::  daughter  Contact Information:  [phone]  Housing/Transportation Living arrangements for the past 2 months:  [place], [place] of Information:  Patient, Adult Children Patient Interpreter Needed:  None Criminal Activity/Legal Involvement Pertinent to Current Situation/Hospitalization:  No - Comment as needed Significant Relationships:  Adult Children Lives with:  Facility Resident Do you feel safe going back to the place where you live?  Yes Need for family participation in patient care:  Yes (Comment)  Care giving concerns:  No care giving concerns identified.    Social Worker assessment / plan:  CSW met with pt and daighter to address consult. Pt was admitted from [facility] and is able to return to facility. CSW introduced herself and explained role of social work. CSW also explained the process of discharging to SNF. Pt is ready for discharge today. Facility is ready to admit pt as they have received discharge information. Pt's daughter will provide transportation. RN will call report. CSW is signing off as no further needs identified.   Employment status:  Retired Nurse, adult PT Recommendations:  [place] / Referral to community resources:  Other (Comment Required) ([place])  Patient/Family's Response to care:  Pt and daughter were appreciative of CSW support.   Patient/Family's Understanding of and Emotional Response to Diagnosis, Current Treatment, and Prognosis:  Pt and daughter  understand that pt would benefit from continued STR.   Emotional Assessment Appearance:  Appears stated age Attitude/Demeanor/Rapport:   (Appropriate) Affect (typically observed):  Accepting, Adaptable, Pleasant Orientation:  Oriented to Self, Oriented to Place, Oriented to  Time, Oriented to Situation Alcohol / Substance use:  Never Used Psych involvement (Current and /or in the community):  No (Comment)  Discharge Needs  Concerns to be addressed:  No discharge needs identified Readmission within the last 30 days:  Yes Current discharge risk:  None Barriers to Discharge:  No Barriers Identified   Darden Dates, LCSW 10/16/2015, 4:24 PM

## 2015-10-16 NOTE — Progress Notes (Signed)
    Subjective: Procedure(s) (LRB): WASH OUT AND CLOSURE OF BACK WOUND (N/A) 1 Day Post-Op  Patient reports pain as 2 on 0-10 scale.  Reports decreased leg pain reports incisional back pain   Positive void Negative bowel movement Positive flatus Negative chest pain or shortness of breath  Objective: Vital signs in last 24 hours: Temp:  [97.3 F (36.3 C)-98.8 F (37.1 C)] 98.7 F (37.1 C) (10/05 0500) Pulse Rate:  [69-92] 86 (10/05 0500) Resp:  [14-20] 18 (10/05 0500) BP: (136-187)/(53-100) 140/73 (10/05 0500) SpO2:  [95 %-100 %] 98 % (10/05 0500) Weight:  [78.6 kg (173 lb 4.5 oz)-86.6 kg (191 lb)] 78.6 kg (173 lb 4.5 oz) (10/04 2014)  Intake/Output from previous day: 10/04 0701 - 10/05 0700 In: 1898.5 [P.O.:720; I.V.:1178.5] Out: -   Labs:  Recent Labs  10/15/15 1154  WBC 6.0  RBC 3.80*  HCT 30.8*  PLT 328    Recent Labs  10/15/15 1154  NA 140  K 3.7  CL 104  CO2 27  BUN 5*  CREATININE 0.80  GLUCOSE 102*  CALCIUM 9.1   No results for input(s): LABPT, INR in the last 72 hours.  Physical Exam: Neurologically intact Intact pulses distally Incision: dressing C/D/I and no drainage Compartment soft  Assessment/Plan: Patient stable  xrays n/a Continue mobilization with physical therapy Continue care  Advance diet Up with therapy Discharge to SNF    Melina Schools, MD [place] [phone]

## 2015-10-16 NOTE — Op Note (Signed)
NAMECOLLENE, TIBBLES           ACCOUNT NO.:  [id number]  MEDICAL RECORD NO.:  [id number]  LOCATION:  [id number]                        FACILITY:  [facility]  PHYSICIAN:  Rovena Hearld D. Rolena Infante, M.D. DATE OF BIRTH:  01-17-1940  DATE OF PROCEDURE:  10/15/2015 DATE OF DISCHARGE:                              OPERATIVE REPORT   PREOPERATIVE DIAGNOSIS:  Postoperative wound infection.  POSTOPERATIVE DIAGNOSIS:  Postoperative wound infection.  OPERATIVE PROCEDURES:  Irrigation and debridement and primary wound closure.  COMPLICATIONS:  None.  CONDITION:  Stable.  HISTORY:  This is a very pleasant woman, who had a transforaminal lumbar interbody fusion and then shortly thereafter developed a pressure sore and superficial wound infection.  She was taken back to the operating room and had a formal I and D and application of a wound VAC.  She was given IV antibiotics as well as 3 times a week dressing changes and has done well.  Evaluation of the wound on Monday demonstrated that it was clean and dry.  There was no drainage.  She has had no fevers and was improving and more ambulatory.  As a result, I elected to take her back to the operating room today for a repeat washout and primary closure of the wound.  All appropriate risks, benefits, and alternatives were discussed with the patient and consent was obtained.  OPERATIVE NOTE:  The patient was brought to the operating room and placed supine on the operating table.  After successful induction of general anesthesia and endotracheal intubation, TEDs, SCDs were applied. She was turned prone onto a pelvic and chest roll.  All bony prominences were well padded.  The wound VAC was removed, and the back was prepped and draped using Betadine and scrubbed.  Time-out was taken to confirm patient, procedure, and all other pertinent important data.  The wound edges were freshened with a #15 blade scalpel, and then both MIS wounds were irrigated copiously  with a total of about 2 L of antibiotic irrigation.  Once this was done, the wounds themselves were reinspected. I then placed vancomycin powder into the wound and then closed them with a vertical mattress suture #1 PDS.  I had excellent closure.  I then over sewed the suture line with a 2-0 nylon.  A bulky dry dressing was applied, and the patient was extubated, transferred to the PACU without incident.  At the end of the case, all needle and sponge counts were correct.  There were no adverse intraoperative events.     Abigail Teall D. Rolena Infante, M.D.    DDB/MEDQ  D:  10/15/2015  T:  10/16/2015  Job:  [id number]

## 2015-10-20 ENCOUNTER — Encounter: Payer: Self-pay | Admitting: Internal Medicine

## 2015-10-20 ENCOUNTER — Non-Acute Institutional Stay (SKILLED_NURSING_FACILITY): Payer: Medicare Other | Admitting: Internal Medicine

## 2015-10-20 DIAGNOSIS — K219 Gastro-esophageal reflux disease without esophagitis: Secondary | ICD-10-CM

## 2015-10-20 DIAGNOSIS — E785 Hyperlipidemia, unspecified: Secondary | ICD-10-CM

## 2015-10-20 DIAGNOSIS — M545 Low back pain, unspecified: Secondary | ICD-10-CM

## 2015-10-20 DIAGNOSIS — D62 Acute posthemorrhagic anemia: Secondary | ICD-10-CM | POA: Diagnosis not present

## 2015-10-20 DIAGNOSIS — E440 Moderate protein-calorie malnutrition: Secondary | ICD-10-CM | POA: Diagnosis not present

## 2015-10-20 DIAGNOSIS — I5032 Chronic diastolic (congestive) heart failure: Secondary | ICD-10-CM | POA: Diagnosis not present

## 2015-10-20 DIAGNOSIS — A498 Other bacterial infections of unspecified site: Secondary | ICD-10-CM | POA: Diagnosis not present

## 2015-10-20 DIAGNOSIS — R2681 Unsteadiness on feet: Secondary | ICD-10-CM | POA: Diagnosis not present

## 2015-10-20 DIAGNOSIS — Z1612 Extended spectrum beta lactamase (ESBL) resistance: Secondary | ICD-10-CM

## 2015-10-20 DIAGNOSIS — I10 Essential (primary) hypertension: Secondary | ICD-10-CM | POA: Diagnosis not present

## 2015-10-20 DIAGNOSIS — IMO0002 Reserved for concepts with insufficient information to code with codable children: Secondary | ICD-10-CM

## 2015-10-20 DIAGNOSIS — G8929 Other chronic pain: Secondary | ICD-10-CM

## 2015-10-20 DIAGNOSIS — E1165 Type 2 diabetes mellitus with hyperglycemia: Secondary | ICD-10-CM

## 2015-10-20 DIAGNOSIS — G4700 Insomnia, unspecified: Secondary | ICD-10-CM | POA: Diagnosis not present

## 2015-10-20 DIAGNOSIS — Z794 Long term (current) use of insulin: Secondary | ICD-10-CM

## 2015-10-20 DIAGNOSIS — E118 Type 2 diabetes mellitus with unspecified complications: Secondary | ICD-10-CM

## 2015-10-20 DIAGNOSIS — T814XXD Infection following a procedure, subsequent encounter: Secondary | ICD-10-CM | POA: Diagnosis not present

## 2015-10-20 DIAGNOSIS — IMO0001 Reserved for inherently not codable concepts without codable children: Secondary | ICD-10-CM

## 2015-10-20 NOTE — Progress Notes (Signed)
LOCATION: [name]  PCP: Antony Blackbird, MD   Code Status: Full Code  Goals of care: Advanced Directive information Advanced Directives 10/01/2015  Does patient have an advance directive? No  Would patient like information on creating an advanced directive? No - patient declined information       Extended Emergency Contact Information Primary Emergency Contact: Pagett,Malinda Address: [address]  [name] Phone: [phone] Relation: Daughter Secondary Emergency Contact: Roseborough,Kevin Address: [place], [place] [address] [place] Phone: [phone] Relation: Son   Allergies  Allergen Reactions  . Morphine And Related Swelling    10/02/15 - given Toradol + morphine and RN noted tongue swelling - given Epi + Benadryl Has had morphine in past w/o problem  . Toradol [Ketorolac Tromethamine] Swelling    10/02/15 - given Toradol + morphine and RN noted tongue swelling - given Epi + Benadryl Has taken ibuprofen in past w/o problem  . Codeine Other (See Comments)    headache    Chief Complaint  Patient presents with  . Readmit To SNF    Readmission Visit     HPI:  Patient is a 75 y.o. female seen today for short term rehabilitation post hospital admission from 10/15/15-10/16/15 with post op wound infection to her surgical site. She underwent washout and closure of back wound on 10/15/15. She is currently on antibiotics. She is seen in her room with her son and daughter present.   Review of Systems:  Constitutional: Negative for fever, chills, diaphoresis. energy level is fair.  HENT: Negative for congestion, nasal discharge, sore throat, difficulty swallowing. Positive for headache.  Eyes: Negative for blurred vision, double vision and discharge.  Respiratory: Negative for cough, shortness of breath and wheezing.   Cardiovascular: Negative for chest pain, palpitations, leg swelling.    Gastrointestinal: Negative for heartburn, nausea, vomiting, abdominal pain. Last bowel movement was yesterday.  Genitourinary: Negative for dysuria and flank pain.  Musculoskeletal: Negative for fall in the facility. Positive for back pain.  Skin: Negative for itching, rash.  Neurological: Negative for dizziness. Psychiatric/Behavioral: Negative for depression.    Past Medical History:  Diagnosis Date  . Breast cyst    left breast  . Chest pain   . Complication of anesthesia 1986   slow to awaken after cholecystectomy  . Diastolic dysfunction    with mild pulmonary HTN by echo 07/2011  . Gastric ulcer   . GERD (gastroesophageal reflux disease)   . Gout   . Hyperlipidemia   . Hypertension   . Neuropathy ([place])    from diabetes per patient  . Osteoarthritis   . Osteopenia   . Shortness of breath dyspnea    with activity  . Spine deformity    lower spine  . Type II diabetes mellitus ([place])    Past Surgical History:  Procedure Laterality Date  . ABDOMINAL ADHESION SURGERY    . ABDOMINAL HYSTERECTOMY    . ANUS SURGERY     for torn tissue  . APPENDECTOMY    . BREAST BIOPSY    . BREAST LUMPECTOMY     left  . CHOLECYSTECTOMY  1986  . COLONOSCOPY    . ESOPHAGOGASTRODUODENOSCOPY (EGD) WITH PROPOFOL N/A 07/18/2013   Procedure: ESOPHAGOGASTRODUODENOSCOPY (EGD) WITH PROPOFOL;  Surgeon: Arta Silence, MD;  Location: [facility];  Service: Endoscopy;  Laterality: N/A;  . EUS N/A 07/18/2013   Procedure: ESOPHAGEAL  ENDOSCOPIC ULTRASOUND (EUS) RADIAL;  Surgeon: Arta Silence, MD;  Location: [facility];  Service: Endoscopy;  Laterality: N/A;  . LUMBAR WOUND DEBRIDEMENT N/A 10/01/2015   Procedure: LUMBAR WOUND DEBRIDEMENT;  Surgeon: Melina Schools, MD;  Location: [facility];  Service: Orthopedics;  Laterality: N/A;  . LUMBAR WOUND DEBRIDEMENT N/A 10/15/2015   Procedure: [facility] OUT AND CLOSURE OF BACK WOUND;  Surgeon: Melina Schools, MD;  Location: [place];  Service: Orthopedics;  Laterality: N/A;   . MASS EXCISION Left 07/24/2015   Procedure: EXCISION MASS LEFT LONG FINGER;  Surgeon: Leanora Cover, MD;  Location: [facility];  Service: Orthopedics;  Laterality: Left;  . SPINAL FUSION N/A 09/17/2015   Procedure: FUSION POSTERIOR SPINAL MULTILEVEL L4- S1;  Surgeon: Melina Schools, MD;  Location: [place];  Service: Orthopedics;  Laterality: N/A;  . TRANSFORAMINAL LUMBAR INTERBODY FUSION (TLIF) WITH PEDICLE SCREW FIXATION 1 LEVEL N/A 09/17/2015   Procedure: TRANSFORAMINAL LUMBAR INTERBODY FUSION (TLIF) WITH PEDICLE SCREW FIXATION 1 LEVEL , Lumbar 4-5;  Surgeon: Melina Schools, MD;  Location: [facility];  Service: Orthopedics;  Laterality: N/A;   Social History:   reports that she has never smoked. She has never used smokeless tobacco. She reports that she does not drink alcohol or use drugs.  Family History  Problem Relation Age of Onset  . Heart disease Mother   . Cancer Sister     ovarian    Medications:   Medication List       Accurate as of 10/20/15 11:01 AM. Always use your most recent med list.          allopurinol 100 MG tablet Commonly known as:  ZYLOPRIM Take 200 mg by mouth daily.   amLODipine 10 MG tablet Commonly known as:  NORVASC Take 10 mg by mouth every morning.   aspirin EC 81 MG tablet Take 81 mg by mouth daily.   COLCRYS 0.6 MG tablet Generic drug:  colchicine Take 0.6 mg by mouth daily.   ertapenem 1 g in sodium chloride 0.9 % 50 mL Inject 1 g into the vein daily.   ferrous sulfate 325 (65 FE) MG tablet Take 1 tablet (325 mg total) by mouth 3 (three) times daily with meals.   furosemide 20 MG tablet Commonly known as:  LASIX TAKE ONE TABLET BY MOUTH ONCE DAILY   glipiZIDE 5 MG tablet Commonly known as:  GLUCOTROL Take 5 mg by mouth daily before breakfast.   insulin aspart 100 UNIT/ML injection Commonly known as:  novoLOG Inject 0-5 Units into the skin at bedtime.   insulin lispro 100 UNIT/ML injection Commonly known as:   HUMALOG Inject 2-14 Units into the skin 3 (three) times daily before meals. 200-250= 2 units, 251-300= 4 units, 301-350= 6 units, 351-400= 8 units, 401-450= 12 units; IF HIGH GIVE 14 UNITS RECHECK IN 2 HOURS IF >400 /<100 CALL MD   lisinopril 40 MG tablet Commonly known as:  PRINIVIL,ZESTRIL Take 40 mg by mouth every morning.   metFORMIN 1000 MG tablet Commonly known as:  GLUCOPHAGE Take 1,000 mg by mouth 2 (two) times daily with a meal.   methocarbamol 500 MG tablet Commonly known as:  ROBAXIN Take 1 tablet (500 mg total) by mouth 3 (three) times daily.   metoprolol 100 MG tablet Commonly known as:  LOPRESSOR Take 100 mg by mouth every morning.   omeprazole 20 MG capsule Commonly known as:  PRILOSEC Take 20 mg by mouth daily.   ondansetron 4 MG tablet Commonly known as:  ZOFRAN Take 4 mg by mouth every 8 (eight) hours as needed for nausea or vomiting.   oxyCODONE-acetaminophen 5-325 MG tablet Commonly known as:  PERCOCET/ROXICET Take 1-2 tablets by mouth every 4 (four) hours as needed for moderate pain.   simvastatin 20 MG tablet Commonly known as:  ZOCOR Take 20 mg by mouth every evening.   SYSTANE OP Apply 1 drop to eye as needed (Dry eyes).   traZODone 50 MG tablet Commonly known as:  DESYREL Take 50-100 mg by mouth at bedtime as needed for sleep.       Immunizations:  There is no immunization history on file for this patient.   Physical Exam:  Vitals:   10/20/15 1051  BP: 117/72  Pulse: 72  Resp: 18  Temp: 97.6 F (36.4 C)  TempSrc: Oral  SpO2: 99%  Weight: 177 lb 3.2 oz (80.4 kg)  Height: 5\' 1"  (1.549 m)   Body mass index is 33.48 kg/m.  General- elderly female, obese, in no acute distress Head- normocephalic, atraumatic Nose- no nasal discharge Throat- moist mucus membrane Eyes- PERRLA, EOMI, no pallor, no icterus, no discharge, normal conjunctiva, normal sclera Neck- no cervical lymphadenopathy Cardiovascular- normal s1,s2, no murmur,  trace leg edema Respiratory- bilateral clear to auscultation, no wheeze, no rhonchi, no crackles, no use of accessory muscles Abdomen- bowel sounds present, soft, non tender Musculoskeletal- able to move all 4 extremities, limited ROM on her back Neurological- alert and oriented to person, place and time Skin- warm and dry, surgical incisions to her back with sutures present, mild erythema, minimal drainage, PICC line RUE Psychiatry- flat affect    Labs reviewed: Basic Metabolic Panel:  Recent Labs  10/01/15 2044 10/08/15 0440 10/15/15 1154  NA 135 139 140  K 3.9 2.9* 3.7  CL 99* 101 104  CO2 27 27 27   GLUCOSE 164* 72 102*  BUN 11 6 5*  CREATININE 1.00 0.81 0.80  CALCIUM 9.0 8.9 9.1  MG 1.3* 1.3*  --   PHOS 4.7*  --   --    Liver Function Tests:  Recent Labs  10/01/15 2044 10/08/15 0440  AST 18 18  ALT 12* 8*  ALKPHOS 77 72  BILITOT 0.5 1.0  PROT 7.4 6.5  ALBUMIN 2.8* 2.4*    CBC:  Recent Labs  10/01/15 1711  10/06/15 0500 10/08/15 0440 10/15/15 1154  WBC 13.0*  < > 6.1 6.5 6.0  NEUTROABS 9.6*  --   --  3.5  --   HGB 9.6*  < > 7.0* 8.5* 9.5*  HCT 29.3*  < > 22.2* 26.6* 30.8*  MCV 80.9  < > 78.4 81.3 81.1  PLT 534*  < > 405* 345 328  < > = values in this interval not displayed.   Radiological Exams: Ct Lumbar Spine Wo Contrast  Result Date: 10/06/2015 CLINICAL DATA:  75 y/o  F; severe lumbar spinal pain after fusion. EXAM: CT LUMBAR SPINE WITHOUT CONTRAST TECHNIQUE: Multidetector CT imaging of the lumbar spine was performed without intravenous contrast administration. Multiplanar CT image reconstructions were also generated. COMPARISON:  09/18/2015 lumbar spine radiographs. CT abdomen and pelvis 12/14/2012. FINDINGS: Segmentation: 5 lumbar type vertebrae. Alignment: Lumbar lordosis is maintained. Grade 1 L4-5 anterolisthesis. Vertebrae: Status post posterior instrumented fusion of L4 through S1, left-sided laminectomies, L4-5 foraminotomy and facetectomy.  Hardware includes vertical rods fixed by 3 right-sided transpedicular screws and left-sided L4 and S1 transpedicular screws. There is and interbody fusion prosthesis at L4-5. There is a linear tract traversing  the left L5 pedicle probably representing sequelae of instrumentation. Vertebral bodies of levels of fusion are partially obscured by streak artifact. Left-sided L5-S1 posterior element morselized bone graft material noted. Paraspinal and other soft tissues: There are open wounds within the lumbar subcutaneous fat consistent with history of recent debridement. There is edema throughout the surrounding soft tissues. Disc levels: Prominent disc bulge at the L3-4 level. Severe disc space narrowing at L5-S1 with endplate degenerative changes. IMPRESSION: 1. Posterior instrumented fusion of L4 through S1. Hardware is intact and no acute hardware related complication is identified. 2. Open wounds within the subcutaneous fat at the L2-3 level and edema throughout paraspinal muscles at levels of fusion. No large discrete fluid collection is identified on this noncontrast study, suboptimal assessment without intravenous contrast. Electronically Signed   By: Kristine Garbe M.D.   On: 10/06/2015 16:02    Assessment/Plan  Gait instability Post lumbar wound debridement and wound closure. Will have her work with physical therapy and occupational therapy team to help with gait training and muscle strengthening exercises.fall precautions. Skin care. Encourage to be out of bed.   E.coli infection Continue and complete course of ertapenem on 11/16/15 for ESBL E.coli. Contact precautions to be taken.   Lumbar wound infection S/p wound vac removal, wash out and closure of wound. Monitor clinically. Monitor wbc and temp curve. Continue wound care. Continue robaxin 500 mg tid for muscle spasm. Continue ertapenem for now until 11/16/15 per ID consult note 10/05/15  Lumbar pain Continue percocet 5-325 mg 1-2 tab  q4h prn pain and robaxin for muscle spasm  Blood loss anemia Post op, monitor cbc. Continue feso4  Protein calorie malnutrition Get RD to evaluate. Monitor weight. Encourage po intake  Diastolic CHF Stable, continue lasix 20 mg daily, lopressor 100 mg daily and lisinopril 40 mg daily. Monitor weight and breathing. To wear ted hose. Bmp check  HTN Monitor BP, continue lopressor, lisinopril and amlodipine. Continue baby aspirin  HLD Continue simvastatin  DM type 2 Lab Results  Component Value Date   HGBA1C 7.3 (H) 10/01/2015   Monitor cbg. Continue metformin 1000 mg bid and glipizide 5 mg daily.  gerd Stable symptom on omeprazole, no change made  Insomnia Continue trazodone    Goals of care: short term rehabilitation   Labs/tests ordered:  Family/ staff Communication: reviewed care plan with patient and nursing supervisor    Blanchie Serve, MD Internal Medicine [place], [place] [address] Cell Phone (Monday-Friday 8 am - 5 pm): [phone] On Call: [phone] and follow prompts after 5 pm and on weekends Office Phone: [phone] Office Fax: [phone]

## 2015-10-22 ENCOUNTER — Other Ambulatory Visit: Payer: Self-pay | Admitting: *Deleted

## 2015-10-22 MED ORDER — OXYCODONE-ACETAMINOPHEN 5-325 MG PO TABS: ORAL_TABLET | ORAL | 0 refills | Status: DC

## 2015-10-22 NOTE — Telephone Encounter (Signed)
[facility] [phone] Fax: [phone]  

## 2015-10-28 ENCOUNTER — Non-Acute Institutional Stay (SKILLED_NURSING_FACILITY): Payer: Medicare Other | Admitting: Family

## 2015-10-28 DIAGNOSIS — Z1612 Extended spectrum beta lactamase (ESBL) resistance: Secondary | ICD-10-CM

## 2015-10-28 DIAGNOSIS — A498 Other bacterial infections of unspecified site: Secondary | ICD-10-CM

## 2015-10-28 DIAGNOSIS — E1165 Type 2 diabetes mellitus with hyperglycemia: Secondary | ICD-10-CM | POA: Diagnosis not present

## 2015-10-28 DIAGNOSIS — T148XXA Other injury of unspecified body region, initial encounter: Secondary | ICD-10-CM | POA: Diagnosis not present

## 2015-10-28 DIAGNOSIS — R2681 Unsteadiness on feet: Secondary | ICD-10-CM

## 2015-10-28 DIAGNOSIS — E118 Type 2 diabetes mellitus with unspecified complications: Secondary | ICD-10-CM

## 2015-10-28 DIAGNOSIS — L089 Local infection of the skin and subcutaneous tissue, unspecified: Secondary | ICD-10-CM | POA: Diagnosis not present

## 2015-10-28 DIAGNOSIS — M1000 Idiopathic gout, unspecified site: Secondary | ICD-10-CM | POA: Diagnosis not present

## 2015-10-28 DIAGNOSIS — R609 Edema, unspecified: Secondary | ICD-10-CM

## 2015-10-28 DIAGNOSIS — I10 Essential (primary) hypertension: Secondary | ICD-10-CM

## 2015-10-28 DIAGNOSIS — R600 Localized edema: Secondary | ICD-10-CM

## 2015-10-28 DIAGNOSIS — E782 Mixed hyperlipidemia: Secondary | ICD-10-CM

## 2015-10-28 NOTE — Progress Notes (Signed)
Location:  [facility] Room Number: [place] of Service:   SNF  Provider: Marlowe Sax FNP-C   PCP: Antony Blackbird, MD Patient Care Team: Antony Blackbird, MD as PCP - General (Family Medicine)  Extended Emergency Contact Information Primary Emergency Contact: Pagett,Malinda Address: [address]          [place], [place] [address] [place] Phone: [phone] Relation: Daughter Secondary Emergency Contact: Roseborough,Kevin Address: [place], [place] [address] [place] Phone: [phone] Relation: Son  Code Status: Full Code  Goals of care:  Advanced Directive information Advanced Directives 10/01/2015  Does patient have an advance directive? No  Would patient like information on creating an advanced directive? No - patient declined information     Allergies  Allergen Reactions  . Morphine And Related Swelling    10/02/15 - given Toradol + morphine and RN noted tongue swelling - given Epi + Benadryl Has had morphine in past w/o problem  . Toradol [Ketorolac Tromethamine] Swelling    10/02/15 - given Toradol + morphine and RN noted tongue swelling - given Epi + Benadryl Has taken ibuprofen in past w/o problem  . Codeine Other (See Comments)    headache    Chief Complaint  Patient presents with  . Discharge Note    Discharge home     HPI:  75 y.o. Nancy [name] seen today at  [facility] for discharge home. She was here for short term Rehabilitation post hospital admission from 10/01/2015-10/07/2015 due to Wound infection. She underwent lumbar wound debridement in the OR on 10/01/2015 and Wound vac was placed.She was managed by Infectious Disease and treated with IV antibiotics.She had an allergic reaction to Morphine during course of admission at the hospital and all I.V. Narcotics were discontinued. She was discharge to Rehab for PT/OT Therapy. She was readmitted to [facility]  10/15/2015 -10/5 /2017 and taken to OR for wash out and closure of lumbar wound. Wound vac was removed in the OR and incision resutured. She returned to Rehab to continue with therapy. She has a medical history of HTN, Type 2 DM, Neuropathy, OA, Gout, Hyperlipidemia, GERD among other conditions. She is seen in her room today with daughter and daughter-in-law at bedside. She states back pain under control with current pain regimen. She was seen by Antionette Char Dr. Rolena Infante prior to this visit sutures removed and steri-strips applied. Okay for patient to take a shower. She is to follow up with Ortho in two weeks. She is currently on IV. Invanz 1 Gm every 24 Hours until 11/16/2015 for positive back wound culture ESBL E-coli. She has been afebrile. She has worked well with PT/OT now stable for discharge home.She will be discharged home with Home health PT/OT to continue with ROM, Exercise, Gait stability and muscle strengthening. She will also need a HH RN for I.V. Medication administration  teaching and management.Right hand PICC line care per home health protocol. She will need CBC/diff and CMP every Wednesday until I.V. invanz is completed 11/16/2015. She will require DME standard WC with Cushion, anti tippers, extended brake handles, removable elevating leg rests to allow her to maintain current level of independence with ADL's. Home health services will be arranged by facility social worker prior to discharge. Prescription medication will be written x 1 month then patient to follow up with PCP in 1-2 weeks.She denies any  acute issues this visit. Facility staff report no new concerns.       Past Medical History:  Diagnosis Date  . Breast cyst    left breast  . Chest pain   . Complication of anesthesia 1986   slow to awaken after cholecystectomy  . Diastolic dysfunction    with mild pulmonary HTN by echo 07/2011  . Gastric ulcer   . GERD (gastroesophageal reflux disease)   . Gout   . Hyperlipidemia   .  Hypertension   . Neuropathy ([place])    from diabetes per patient  . Osteoarthritis   . Osteopenia   . Shortness of breath dyspnea    with activity  . Spine deformity    lower spine  . Type II diabetes mellitus ([place])     Past Surgical History:  Procedure Laterality Date  . ABDOMINAL ADHESION SURGERY    . ABDOMINAL HYSTERECTOMY    . ANUS SURGERY     for torn tissue  . APPENDECTOMY    . BREAST BIOPSY    . BREAST LUMPECTOMY     left  . CHOLECYSTECTOMY  1986  . COLONOSCOPY    . ESOPHAGOGASTRODUODENOSCOPY (EGD) WITH PROPOFOL N/A 07/18/2013   Procedure: ESOPHAGOGASTRODUODENOSCOPY (EGD) WITH PROPOFOL;  Surgeon: Arta Silence, MD;  Location: [facility];  Service: Endoscopy;  Laterality: N/A;  . EUS N/A 07/18/2013   Procedure: ESOPHAGEAL ENDOSCOPIC ULTRASOUND (EUS) RADIAL;  Surgeon: Arta Silence, MD;  Location: [facility];  Service: Endoscopy;  Laterality: N/A;  . LUMBAR WOUND DEBRIDEMENT N/A 10/01/2015   Procedure: LUMBAR WOUND DEBRIDEMENT;  Surgeon: Melina Schools, MD;  Location: [facility];  Service: Orthopedics;  Laterality: N/A;  . LUMBAR WOUND DEBRIDEMENT N/A 10/15/2015   Procedure: [facility] OUT AND CLOSURE OF BACK WOUND;  Surgeon: Melina Schools, MD;  Location: [facility];  Service: Orthopedics;  Laterality: N/A;  . MASS EXCISION Left 07/24/2015   Procedure: EXCISION MASS LEFT LONG FINGER;  Surgeon: Leanora Cover, MD;  Location: [facility];  Service: Orthopedics;  Laterality: Left;  . SPINAL FUSION N/A 09/17/2015   Procedure: FUSION POSTERIOR SPINAL MULTILEVEL L4- S1;  Surgeon: Melina Schools, MD;  Location: [facility];  Service: Orthopedics;  Laterality: N/A;  . TRANSFORAMINAL LUMBAR INTERBODY FUSION (TLIF) WITH PEDICLE SCREW FIXATION 1 LEVEL N/A 09/17/2015   Procedure: TRANSFORAMINAL LUMBAR INTERBODY FUSION (TLIF) WITH PEDICLE SCREW FIXATION 1 LEVEL , Lumbar 4-5;  Surgeon: Melina Schools, MD;  Location: [facility];  Service: Orthopedics;  Laterality: N/A;      reports that she has never smoked. She  has never used smokeless tobacco. She reports that she does not drink alcohol or use drugs. Social History   Social History  . Marital status: Divorced    Spouse name: N/A  . Number of children: 4  . Years of education: 12   Occupational History  . Not on file.   Social History Main Topics  . Smoking status: Never Smoker  . Smokeless tobacco: Never Used  . Alcohol use No     Comment: occasional wine  . Drug use: No  . Sexual activity: Not on file   Other Topics Concern  . Not on file   Social History Narrative   Patient is single, has 3 children living 1 deceased   Patient is right handed   Education level is 12   Caffeine consumption is 0   Functional Status Survey:    Allergies  Allergen Reactions  . Morphine And Related Swelling    10/02/15 -  given Toradol + morphine and RN noted tongue swelling - given Epi + Benadryl Has had morphine in past w/o problem  . Toradol [Ketorolac Tromethamine] Swelling    10/02/15 - given Toradol + morphine and RN noted tongue swelling - given Epi + Benadryl Has taken ibuprofen in past w/o problem  . Codeine Other (See Comments)    headache    Pertinent  Health Maintenance Due  Topic Date Due  . FOOT EXAM  03/12/1950  . OPHTHALMOLOGY EXAM  03/12/1950  . COLONOSCOPY  03/12/1992  . PNA vac Low Risk Adult (1 of 2 - PCV13) 03/13/2007  . INFLUENZA VACCINE  08/12/2015  . HEMOGLOBIN A1C  03/30/2016  . MAMMOGRAM  09/29/2016  . DEXA SCAN  Completed    Medications:   Medication List       Accurate as of 10/28/15  4:15 PM. Always use your most recent med list.          allopurinol 100 MG tablet Commonly known as:  ZYLOPRIM Take 200 mg by mouth daily.   amLODipine 10 MG tablet Commonly known as:  NORVASC Take 10 mg by mouth every morning.   aspirin EC 81 MG tablet Take 81 mg by mouth daily.   COLCRYS 0.6 MG tablet Generic drug:  colchicine Take 0.6 mg by mouth daily.   ertapenem 1 g in sodium chloride 0.9 % 50  mL Inject 1 g into the vein daily.   ferrous sulfate 325 (65 FE) MG tablet Take 1 tablet (325 mg total) by mouth 3 (three) times daily with meals.   furosemide 20 MG tablet Commonly known as:  LASIX TAKE ONE TABLET BY MOUTH ONCE DAILY   glipiZIDE 5 MG tablet Commonly known as:  GLUCOTROL Take 5 mg by mouth daily before breakfast.   insulin aspart 100 UNIT/ML injection Commonly known as:  novoLOG Inject 0-5 Units into the skin at bedtime.   insulin lispro 100 UNIT/ML injection Commonly known as:  HUMALOG Inject 2-14 Units into the skin 3 (three) times daily before meals. 200-250= 2 units, 251-300= 4 units, 301-350= 6 units, 351-400= 8 units, 401-450= 12 units; IF HIGH GIVE 14 UNITS RECHECK IN 2 HOURS IF >400 /<100 CALL MD   lisinopril 40 MG tablet Commonly known as:  PRINIVIL,ZESTRIL Take 40 mg by mouth every morning.   metFORMIN 1000 MG tablet Commonly known as:  GLUCOPHAGE Take 1,000 mg by mouth 2 (two) times daily with a meal.   metoprolol 100 MG tablet Commonly known as:  LOPRESSOR Take 100 mg by mouth every morning.   omeprazole 20 MG capsule Commonly known as:  PRILOSEC Take 20 mg by mouth daily.   ondansetron 4 MG tablet Commonly known as:  ZOFRAN Take 4 mg by mouth every 8 (eight) hours as needed for nausea or vomiting.   oxyCODONE-acetaminophen 5-325 MG tablet Commonly known as:  PERCOCET/ROXICET Take one tablet by mouth three times daily as needed for pain. Do not exceed 4gm of Tylenol in 24 hours   simvastatin 20 MG tablet Commonly known as:  ZOCOR Take 20 mg by mouth every evening.   SYSTANE OP Apply 1 drop to eye as needed (Dry eyes).   traZODone 50 MG tablet Commonly known as:  DESYREL Take 50-100 mg by mouth at bedtime as needed for sleep.       Review of Systems  Constitutional: Negative for activity change, appetite change, chills, fatigue and fever.  HENT: Negative for congestion, rhinorrhea, sinus pressure, sneezing and sore throat.  Eyes: Negative.   Respiratory: Negative for cough, chest tightness, shortness of breath and wheezing.   Cardiovascular: Positive for leg swelling. Negative for chest pain and palpitations.  Gastrointestinal: Negative for abdominal distention, abdominal pain, constipation, diarrhea, nausea and vomiting.  Endocrine: Negative.   Genitourinary: Negative for dysuria, frequency and urgency.  Musculoskeletal: Positive for back pain and gait problem.  Skin: Negative for color change, pallor and rash.       Lumbar Surgical incision   Neurological: Negative for seizures, syncope, light-headedness and headaches.  Hematological: Does not bruise/bleed easily.  Psychiatric/Behavioral: Negative for agitation, confusion, hallucinations and sleep disturbance. The patient is not nervous/anxious.     Vitals:   10/28/15 1200  BP: 133/88  Pulse: 82  Resp: 20  Temp: 97.6 F (36.4 C)  SpO2: 97%  Weight: 177 lb (80.3 kg)  Height: 5\' 1"  (1.549 m)   Body mass index is 33.44 kg/m. Physical Exam  Constitutional: She is oriented to person, place, and time. She appears well-developed and well-nourished. No distress.  HENT:  Head: Normocephalic.  Mouth/Throat: Oropharynx is clear and moist. No oropharyngeal exudate.  Eyes: Conjunctivae and EOM are normal. Pupils are equal, round, and reactive to light. Right eye exhibits no discharge. Left eye exhibits no discharge. No scleral icterus.  Neck: Normal range of motion. No JVD present. No thyromegaly present.  Cardiovascular: Normal rate, regular rhythm, normal heart sounds and intact distal pulses.  Exam reveals no gallop and no friction rub.   No murmur heard. Pulmonary/Chest: Effort normal and breath sounds normal. No respiratory distress. She has no wheezes. She has no rales.  Abdominal: Soft. Bowel sounds are normal. She exhibits no distension. There is no tenderness. There is no rebound and no guarding.  Musculoskeletal: She exhibits no tenderness.   Unsteady gait. Moves X 4 extremities. Bilateral lower extremities 1-2 + edema.   Lymphadenopathy:    She has no cervical adenopathy.  Neurological: She is oriented to person, place, and time.  Skin: Skin is warm and dry. No rash noted. No erythema. No pallor.  Lumbar surgical incision with steri-strips dry,clean and intact. Surrounding skin tissues without any signs of infections.   Psychiatric: She has a normal mood and affect.    Labs reviewed: Basic Metabolic Panel:  Recent Labs  10/01/15 2044 10/08/15 0440 10/15/15 1154  NA 135 139 140  K 3.9 2.9* 3.7  CL 99* 101 104  CO2 27 27 27   GLUCOSE 164* 72 102*  BUN 11 6 5*  CREATININE 1.00 0.81 0.80  CALCIUM 9.0 8.9 9.1  MG 1.3* 1.3*  --   PHOS 4.7*  --   --    Liver Function Tests:  Recent Labs  10/01/15 2044 10/08/15 0440  AST 18 18  ALT 12* 8*  ALKPHOS 77 72  BILITOT 0.5 1.0  PROT 7.4 6.5  ALBUMIN 2.8* 2.4*   CBC:  Recent Labs  10/01/15 1711  10/06/15 0500 10/08/15 0440 10/15/15 1154  WBC 13.0*  < > 6.1 6.5 6.0  NEUTROABS 9.6*  --   --  3.5  --   HGB 9.6*  < > 7.0* 8.5* 9.5*  HCT 29.3*  < > 22.2* 26.6* 30.8*  MCV 80.9  < > 78.4 81.3 81.1  PLT 534*  < > 405* 345 328  < > = values in this interval not displayed.  Recent Labs  10/16/15 0350 10/16/15 0725 10/16/15 1202  GLUCAP 114* 85 121*    Assessment/Plan:   1. Essential hypertension,  benign B/p stable.Continue on Amlodipine, Lisinopril and Metoprolol. Monitor BMP   2. Uncontrolled type 2 diabetes mellitus with complication, unspecified long term insulin use status (HCC) CBG's stable ranging 80's-190's.testing three times daily before meals. Continue on Glipizide, Metformin and Humalog per SSI. Hgb A1C with PCP.   3. Gait instability Has worked well with PT/ OT. Will discharge home PT/OT to continue with ROM, Exercise, Gait stability and muscle strengthening. She will require DME standard WC with Cushion, anti tippers, extended brake handles,  removable elevating leg rests  to allow her to maintain current level of independence with ADL's. Fall and safety precautions.   4. Mixed hyperlipidemia Continue on simvastatin. Lipid panel with PCP.   5. Idiopathic gout, unspecified chronicity, unspecified site Asymptomatic. Continue on allopurinol and colchicine. Consider d/c colchicine.   6. Wound infection Afebrile. Status post short term Rehabilitation post hospital admission from 10/01/2015-10/07/2015 due to Wound infection. She underwent lumbar wound debridement in the OR on 10/01/2015 and Wound vac was placed.She was managed by Infectious Disease and treated with IV antibiotics.She had an allergic reaction to Morphine during course of admission at the hospital and all I.V. Narcotics were discontinued. She was discharge to Rehab for PT/OT Therapy. She was readmitted to [facility] 10/15/2015 -10/5 /2017 and taken to OR for wash out and closure of lumbar wound. Wound vac was removed in the OR and incision resutured.She was seen by Antionette Char Dr. Rolena Infante prior to this visit sutures removed and steri-strips applied. Okay for patient to take a shower. She is to follow up with Ortho in two weeks. Continue on ASA. Continue on Percocet Q 8 Hrs PRN for pain. Continue on I.v invanz 1 Gm. CBC/diff and CMP every Wednesday until I.V. invanz is completed 11/16/2015.Follow up with ID as directed.   7. Infection due to ESBL-producing Escherichia coli  currently on IV. Invanz 1 Gm every 24 Hours until 11/16/2015 for positive back wound culture ESBL E-coli. [place] RN for I.V. Medication administration  teaching and management.Right hand PICC line care per home health protocol. CBC/diff and CMP every Wednesday until I.V. invanz is completed 11/16/2015.  8. Peripheral edema Bilateral 1-2+ edema. Continue on Furosemide. Bilateral Knee high Ted hose on in the morning and off at bedtime.   Patient is being discharged with the following home health services:   -PT/OT to  continue with ROM, Exercise, Gait stability and muscle strengthening.   -HH RN for I.V. Medication administration  teaching and management.Right hand PICC line care per home health protocol.  CBC/diff and CMP every Wednesday until I.V. invanz is completed 11/16/2015.   Patient is being discharged with the following durable medical equipment:    - standard WC with Cushion, anti tippers, extended brake handles, removable elevating leg rests  to allow her to maintain current level of independence with ADL's.  Patient has been advised to f/u with their PCP in 1-2 weeks to for a transitions of care visit.  Social services at their facility was responsible for arranging this appointment.  Pt was provided with adequate prescriptions of noncontrolled medications to reach the scheduled appointment .  For controlled substances, a limited supply was provided as appropriate for the individual patient.  If the pt normally receives these medications from a pain clinic or has a contract with another physician, these medications should be received from that clinic or physician only).    Future labs/tests needed:  CBC/diff and CMP every Wednesday until I.V. invanz is completed 11/16/2015.

## 2015-10-29 ENCOUNTER — Encounter (HOSPITAL_COMMUNITY): Payer: Self-pay | Admitting: Emergency Medicine

## 2015-10-29 ENCOUNTER — Emergency Department (HOSPITAL_COMMUNITY)
Admission: EM | Admit: 2015-10-29 | Discharge: 2015-10-30 | Disposition: A | Discharge status: Home / Self Care | Payer: Medicare Other | Attending: Emergency Medicine | Admitting: Emergency Medicine

## 2015-10-29 DIAGNOSIS — S3992XS Unspecified injury of lower back, sequela: Secondary | ICD-10-CM | POA: Diagnosis present

## 2015-10-29 DIAGNOSIS — X58XXXS Exposure to other specified factors, sequela: Secondary | ICD-10-CM | POA: Diagnosis not present

## 2015-10-29 DIAGNOSIS — Z794 Long term (current) use of insulin: Secondary | ICD-10-CM | POA: Insufficient documentation

## 2015-10-29 DIAGNOSIS — E1140 Type 2 diabetes mellitus with diabetic neuropathy, unspecified: Secondary | ICD-10-CM | POA: Diagnosis not present

## 2015-10-29 DIAGNOSIS — S21209S Unspecified open wound of unspecified back wall of thorax without penetration into thoracic cavity, sequela: Secondary | ICD-10-CM | POA: Diagnosis not present

## 2015-10-29 DIAGNOSIS — Z7984 Long term (current) use of oral hypoglycemic drugs: Secondary | ICD-10-CM | POA: Diagnosis not present

## 2015-10-29 DIAGNOSIS — Z7982 Long term (current) use of aspirin: Secondary | ICD-10-CM | POA: Insufficient documentation

## 2015-10-29 DIAGNOSIS — I10 Essential (primary) hypertension: Secondary | ICD-10-CM | POA: Insufficient documentation

## 2015-10-29 NOTE — ED Triage Notes (Signed)
Pt. accidentally pulled out her PICC line this evening , no bleeding at insertion site /dressing applied at triage .

## 2015-10-30 ENCOUNTER — Encounter: Payer: Self-pay | Admitting: General Surgery

## 2015-10-30 ENCOUNTER — Other Ambulatory Visit: Payer: Self-pay | Admitting: Infectious Diseases

## 2015-10-30 ENCOUNTER — Ambulatory Visit (HOSPITAL_COMMUNITY)
Admission: RE | Admit: 2015-10-30 | Discharge: 2015-10-30 | Disposition: A | Discharge status: Home / Self Care | Payer: Medicare Other | Source: Ambulatory Visit | Attending: Infectious Diseases | Admitting: Infectious Diseases

## 2015-10-30 DIAGNOSIS — B999 Unspecified infectious disease: Secondary | ICD-10-CM

## 2015-10-30 DIAGNOSIS — I878 Other specified disorders of veins: Secondary | ICD-10-CM | POA: Diagnosis not present

## 2015-10-30 HISTORY — PX: IR GENERIC HISTORICAL: IMG1180011

## 2015-10-30 MED ORDER — HEPARIN SOD (PORK) LOCK FLUSH 100 UNIT/ML IV SOLN
INTRAVENOUS | Status: DC | PRN
  Administered 2015-10-30: .1 [IU]

## 2015-10-30 MED ORDER — LIDOCAINE HCL 1 % IJ SOLN
INTRAMUSCULAR | Status: AC
  Filled 2015-10-30: qty 20

## 2015-10-30 MED ORDER — HEPARIN SOD (PORK) LOCK FLUSH 100 UNIT/ML IV SOLN
INTRAVENOUS | Status: AC
  Filled 2015-10-30: qty 5

## 2015-10-30 MED ORDER — LIDOCAINE HCL 1 % IJ SOLN
INTRAMUSCULAR | Status: DC | PRN
  Administered 2015-10-30: 2 mL

## 2015-10-30 NOTE — Procedures (Signed)
Successful placement of a single lumen PICC at the cavo-atrial junction Right brachial, 35cm in length Ready for immediate use  Nancy [name] E 1:05 PM 10/30/2015

## 2015-10-30 NOTE — ED Notes (Signed)
MD at bedside. 

## 2015-10-30 NOTE — Discharge Instructions (Signed)
You will receive a call tomorrow to determine where to go for PICC line placement

## 2015-10-30 NOTE — ED Provider Notes (Signed)
[facility] DEPT Provider Note   CSN: [id number] Arrival date & time: 10/29/15  1851     History   Chief Complaint Chief Complaint  Patient presents with  . Needs new PICC line    HPI Nancy [name] is a 75 y.o. female who presents to have her PICC reinserted. PMH significant for post-op wound infection requiring wash out on 10/4. She was sent to SNF for short term rehab. A PICC was placed and she has been taking Invanz 1 gm daily IV which she is supposed to continue until 11/5. She has taken her dose today. She states that she was discharged home from rehab today. The staff changed the dressing around the PICC however family believes it was not secured well. When the patient came home and took her coat off the PICC came out. Bleeding was controlled with pressure. They deny any other complaints.  HPI  Past Medical History:  Diagnosis Date  . Breast cyst    left breast  . Chest pain   . Complication of anesthesia 1986   slow to awaken after cholecystectomy  . Diastolic dysfunction    with mild pulmonary HTN by echo 07/2011  . Gastric ulcer   . GERD (gastroesophageal reflux disease)   . Gout   . Hyperlipidemia   . Hypertension   . Neuropathy ([place])    from diabetes per patient  . Osteoarthritis   . Osteopenia   . Shortness of breath dyspnea    with activity  . Spine deformity    lower spine  . Type II diabetes mellitus [facility])     Patient Active Problem List   Diagnosis Date Noted  . Difficulty in walking, not elsewhere classified   . Spinal fusion failure ([place])   . Uncontrolled type 2 diabetes mellitus with complication ([place])   . 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 Angioedema   . Gout 10/01/2015  . Hyperlipidemia 10/01/2015  . Type 2 diabetes mellitus ([place]) 10/01/2015  . Wound infection 10/01/2015  . Back pain 09/17/2015  . Gait instability 02/19/2013  . Pulmonary hyperinflation 01/17/2013  . Diastolic dysfunction   . Essential  hypertension, benign 01/03/2013  . Infected sebaceous cyst 12/23/2010    Past Surgical History:  Procedure Laterality Date  . ABDOMINAL ADHESION SURGERY    . ABDOMINAL HYSTERECTOMY    . ANUS SURGERY     for torn tissue  . APPENDECTOMY    . BREAST BIOPSY    . BREAST LUMPECTOMY     left  . CHOLECYSTECTOMY  1986  . COLONOSCOPY    . ESOPHAGOGASTRODUODENOSCOPY (EGD) WITH PROPOFOL N/A 07/18/2013   Procedure: ESOPHAGOGASTRODUODENOSCOPY (EGD) WITH PROPOFOL;  Surgeon: Arta Silence, MD;  [facility];  Service: Endoscopy;  Laterality: N/A;  . EUS N/A 07/18/2013   Procedure: ESOPHAGEAL ENDOSCOPIC ULTRASOUND (EUS) RADIAL;  Surgeon: Arta Silence, MD;  [facility];  Service: Endoscopy;  Laterality: N/A;  . IR GENERIC HISTORICAL  10/30/2015   IR US GUIDE VASC ACCESS RIGHT 10/30/2015 Saverio Danker, PA-C [facility]  . IR GENERIC HISTORICAL  10/30/2015   IR FLUORO GUIDE CV LINE RIGHT 10/30/2015 Saverio Danker, PA-C [facility]  . LUMBAR WOUND DEBRIDEMENT N/A 10/01/2015   Procedure: LUMBAR WOUND DEBRIDEMENT;  Surgeon: Melina Schools, MD;  Location: [facility];  Service: Orthopedics;  Laterality: N/A;  . LUMBAR WOUND DEBRIDEMENT N/A 10/15/2015   Procedure: [facility] OUT AND CLOSURE OF BACK WOUND;  Surgeon: Melina Schools, MD;  Location: [facility];  Service:  Orthopedics;  Laterality: N/A;  . MASS EXCISION Left 07/24/2015   Procedure: EXCISION MASS LEFT LONG FINGER;  Surgeon: Leanora Cover, MD;  Location: [place];  Service: Orthopedics;  Laterality: Left;  . SPINAL FUSION N/A 09/17/2015   Procedure: FUSION POSTERIOR SPINAL MULTILEVEL L4- S1;  Surgeon: Melina Schools, MD;  Location: [facility];  Service: Orthopedics;  Laterality: N/A;  . TRANSFORAMINAL LUMBAR INTERBODY FUSION (TLIF) WITH PEDICLE SCREW FIXATION 1 LEVEL N/A 09/17/2015   Procedure: TRANSFORAMINAL LUMBAR INTERBODY FUSION (TLIF) WITH PEDICLE SCREW FIXATION 1 LEVEL , Lumbar 4-5;  Surgeon: Melina Schools, MD;  Location: [facility];   Service: Orthopedics;  Laterality: N/A;    OB History    No data available       Home Medications    Prior to Admission medications   Medication Sig Start Date End Date Taking? Authorizing Provider  allopurinol (ZYLOPRIM) 100 MG tablet Take 200 mg by mouth daily.    Yes Historical Provider, MD  amLODipine (NORVASC) 10 MG tablet Take 10 mg by mouth every morning.    Yes Historical Provider, MD  aspirin EC 81 MG tablet Take 81 mg by mouth daily.   Yes Historical Provider, MD  colchicine (COLCRYS) 0.6 MG tablet Take 0.6 mg by mouth daily.    Yes Historical Provider, MD  ertapenem 1 g in sodium chloride 0.9 % 50 mL Inject 1 g into the vein daily. 10/15/15  Yes Melina Schools, MD  ferrous sulfate 325 (65 FE) MG tablet Take 1 tablet (325 mg total) by mouth 3 (three) times daily with meals. 10/07/15  Yes Carmen Christina Mayo, PA-C  furosemide (LASIX) 20 MG tablet TAKE ONE TABLET BY MOUTH ONCE DAILY 06/04/14  Yes Sueanne Margarita, MD  glipiZIDE (GLUCOTROL) 5 MG tablet Take 5 mg by mouth daily before breakfast.   Yes Historical Provider, MD  insulin aspart (NOVOLOG) 100 UNIT/ML injection Inject 0-5 Units into the skin at bedtime. 10/07/15  Yes Carmen Christina Mayo, PA-C  insulin lispro (HUMALOG) 100 UNIT/ML injection Inject 2-14 Units into the skin 3 (three) times daily before meals. 200-250= 2 units, 251-300= 4 units, 301-350= 6 units, 351-400= 8 units, 401-450= 12 units; IF HIGH GIVE 14 UNITS RECHECK IN 2 HOURS IF >400 /<100 CALL MD   Yes Historical Provider, MD  lisinopril (PRINIVIL,ZESTRIL) 40 MG tablet Take 40 mg by mouth every morning.    Yes Historical Provider, MD  metFORMIN (GLUCOPHAGE) 1000 MG tablet Take 1,000 mg by mouth 2 (two) times daily with a meal.     Yes Historical Provider, MD  metoprolol (LOPRESSOR) 100 MG tablet Take 100 mg by mouth every morning.    Yes Historical Provider, MD  omeprazole (PRILOSEC) 20 MG capsule Take 20 mg by mouth daily.   Yes Historical Provider, MD    ondansetron (ZOFRAN) 4 MG tablet Take 4 mg by mouth every 8 (eight) hours as needed for nausea or vomiting.   Yes Historical Provider, MD  oxyCODONE-acetaminophen (PERCOCET/ROXICET) 5-325 MG tablet Take one tablet by mouth three times daily as needed for pain. Do not exceed 4gm of Tylenol in 24 hours 10/22/15  Yes Estill Dooms, MD  Polyethyl Glycol-Propyl Glycol (SYSTANE OP) Apply 1 drop to eye as needed (Dry eyes).   Yes Historical Provider, MD  simvastatin (ZOCOR) 20 MG tablet Take 20 mg by mouth every evening.    Yes Historical Provider, MD  traZODone (DESYREL) 50 MG tablet Take 50-100 mg by mouth at bedtime as needed for sleep.  Yes Historical Provider, MD    Family History Family History  Problem Relation Age of Onset  . Heart disease Mother   . Cancer Sister     ovarian    Social History Social History  Substance Use Topics  . Smoking status: Never Smoker  . Smokeless tobacco: Never Used  . Alcohol use No     Comment: occasional wine     Allergies   Morphine and related; Toradol [ketorolac tromethamine]; and Codeine   Review of Systems Review of Systems  Constitutional: Negative for fever.  Skin: Positive for wound.       Need to reinsert PICC  All other systems reviewed and are negative.    Physical Exam Updated Vital Signs BP 136/64   Pulse 74   Temp 97.6 F (36.4 C) (Oral)   Resp 16   Ht 5\' 1"  (1.549 m)   Wt 80.3 kg   SpO2 99%   BMI 33.44 kg/m   Physical Exam  Constitutional: She is oriented to person, place, and time. She appears well-developed and well-nourished. No distress.  HENT:  Head: Normocephalic and atraumatic.  Eyes: Conjunctivae are normal. Pupils are equal, round, and reactive to light. Right eye exhibits no discharge. Left eye exhibits no discharge. No scleral icterus.  Neck: Normal range of motion. Neck supple.  Cardiovascular: Normal rate and regular rhythm.   No murmur heard. Pulmonary/Chest: Effort normal and breath sounds  normal. No respiratory distress.  Abdominal: Soft. She exhibits no distension. There is no tenderness.  Musculoskeletal: She exhibits no edema.  Neurological: She is alert and oriented to person, place, and time.  Skin: Skin is warm and dry.  PICC site bleeding controlled  Psychiatric: She has a normal mood and affect. Her behavior is normal.  Nursing note and vitals reviewed.    ED Treatments / Results  Labs (all labs ordered are listed, but only abnormal results are displayed) Labs Reviewed - No data to display  EKG  EKG Interpretation None       Radiology Ir Fluoro Guide Cv Line Right  Result Date: 10/30/2015 Saverio Danker, PA-C     10/30/2015  1:05 PM Successful placement of a single lumen PICC at the cavo-atrial junction Right brachial, 35cm in length Ready for immediate use OSBORNE,Gustav Knueppel E 1:05 PM 10/30/2015    Ir US Guide Vasc Access Right  Result Date: 10/30/2015 Saverio Danker, PA-C     10/30/2015  1:05 PM Successful placement of a single lumen PICC at the cavo-atrial junction Right brachial, 35cm in length Ready for immediate use OSBORNE,Matt Delpizzo E 1:05 PM 10/30/2015     Procedures Procedures (including critical care time)  Medications Ordered in ED Medications - No data to display   Initial Impression / Assessment and Plan / ED Course  I have reviewed the triage vital signs and the nursing notes.  Pertinent labs & imaging results that were available during my care of the patient were reviewed by me and considered in my medical decision making (see chart for details).  Clinical Course   75 year old female in need of PICC reinsertion. Unfortunately PICC team is not available at night. Patient and family are obviously upset after waiting several hours to be seen. Discussed with Dr. Myrene Buddy and Charge RN Santiago Glad. Patient does not want to stay in ED overnight. Will attempt to arrange outpatient follow up so they do not have to come back to ED but stated I could not  promise that this could be arranged.  I would have to call at 6:30AM to see if patient can be scheduled.  6:30 AM : Called IR scheduling. They will get in contact with patient to arrange PICC placement.    Final Clinical Impressions(s) / ED Diagnoses   Final diagnoses:  Wound of back, unspecified laterality, sequela    New Prescriptions Discharge Medication List as of 10/30/2015  1:37 AM       Recardo Evangelist, PA-C 10/30/15 1928    Duffy Bruce, MD 10/31/15 [phone]

## 2015-11-05 ENCOUNTER — Other Ambulatory Visit (HOSPITAL_COMMUNITY)
Admission: RE | Admit: 2015-11-05 | Discharge: 2015-11-05 | Disposition: A | Discharge status: Home / Self Care | Payer: Medicare Other | Source: Other Acute Inpatient Hospital | Attending: Orthopedic Surgery | Admitting: Orthopedic Surgery

## 2015-11-05 DIAGNOSIS — E119 Type 2 diabetes mellitus without complications: Secondary | ICD-10-CM | POA: Insufficient documentation

## 2015-11-05 LAB — CBC WITH DIFFERENTIAL/PLATELET
BASOS PCT: 0 %
Basophils Absolute: 0 10*3/uL (ref 0.0–0.1)
EOS ABS: 0.1 10*3/uL (ref 0.0–0.7)
EOS PCT: 2 %
HCT: 32.5 % — ABNORMAL LOW (ref 36.0–46.0)
HEMOGLOBIN: 10.7 g/dL — AB (ref 12.0–15.0)
LYMPHS ABS: 2.1 10*3/uL (ref 0.7–4.0)
Lymphocytes Relative: 33 %
MCH: 25.8 pg — AB (ref 26.0–34.0)
MCHC: 32.9 g/dL (ref 30.0–36.0)
MCV: 78.5 fL (ref 78.0–100.0)
Monocytes Absolute: 0.5 10*3/uL (ref 0.1–1.0)
Monocytes Relative: 8 %
NEUTROS PCT: 57 %
Neutro Abs: 3.5 10*3/uL (ref 1.7–7.7)
PLATELETS: 307 10*3/uL (ref 150–400)
RBC: 4.14 MIL/uL (ref 3.87–5.11)
RDW: 16.2 % — ABNORMAL HIGH (ref 11.5–15.5)
WBC: 6.2 10*3/uL (ref 4.0–10.5)

## 2015-11-05 LAB — COMPREHENSIVE METABOLIC PANEL
ALBUMIN: 3.2 g/dL — AB (ref 3.5–5.0)
ALK PHOS: 75 U/L (ref 38–126)
ALT: 13 U/L — AB (ref 14–54)
ANION GAP: 10 (ref 5–15)
AST: 22 U/L (ref 15–41)
BUN: 9 mg/dL (ref 6–20)
CHLORIDE: 99 mmol/L — AB (ref 101–111)
CO2: 28 mmol/L (ref 22–32)
Calcium: 9.2 mg/dL (ref 8.9–10.3)
Creatinine, Ser: 0.62 mg/dL (ref 0.44–1.00)
GFR calc non Af Amer: >60 mL/min (ref >60)
GLUCOSE: 91 mg/dL (ref 65–99)
Potassium: 3.2 mmol/L — ABNORMAL LOW (ref 3.5–5.1)
SODIUM: 137 mmol/L (ref 135–145)
Total Bilirubin: 0.7 mg/dL (ref 0.3–1.2)
Total Protein: 7.8 g/dL (ref 6.5–8.1)

## 2015-12-15 ENCOUNTER — Other Ambulatory Visit: Payer: Self-pay | Admitting: Family

## 2015-12-15 ENCOUNTER — Other Ambulatory Visit: Payer: Self-pay | Admitting: Family Medicine

## 2015-12-15 DIAGNOSIS — Z1231 Encounter for screening mammogram for malignant neoplasm of breast: Secondary | ICD-10-CM

## 2015-12-18 ENCOUNTER — Encounter: Payer: Self-pay | Admitting: Internal Medicine

## 2015-12-18 ENCOUNTER — Ambulatory Visit (INDEPENDENT_AMBULATORY_CARE_PROVIDER_SITE_OTHER): Payer: Medicare Other | Admitting: Internal Medicine

## 2015-12-18 VITALS — BP 128/73 | HR 65 | Temp 97.6°F | Ht 61.0 in | Wt 175.0 lb

## 2015-12-18 DIAGNOSIS — A498 Other bacterial infections of unspecified site: Secondary | ICD-10-CM

## 2015-12-18 DIAGNOSIS — IMO0001 Reserved for inherently not codable concepts without codable children: Secondary | ICD-10-CM

## 2015-12-18 DIAGNOSIS — Z1612 Extended spectrum beta lactamase (ESBL) resistance: Secondary | ICD-10-CM | POA: Diagnosis not present

## 2015-12-18 DIAGNOSIS — T814XXA Infection following a procedure, initial encounter: Secondary | ICD-10-CM | POA: Diagnosis not present

## 2015-12-18 DIAGNOSIS — T8149XA Infection following a procedure, other surgical site, initial encounter: Secondary | ICD-10-CM | POA: Insufficient documentation

## 2015-12-18 NOTE — Assessment & Plan Note (Signed)
I strongly suspect that her postoperative wound infection has been cured with a combination of surgery and 6 weeks of IV ertapenem. She continues to improve one month after completing antibiotic therapy. I would not do any further diagnostic evaluation or treatment at this time. She can follow-up here as needed.

## 2015-12-18 NOTE — Progress Notes (Signed)
[facility] for Infectious Disease  Patient Active Problem List   Diagnosis Date Noted  . Postoperative wound infection 12/18/2015    Priority: High  . Infection due to ESBL-producing Escherichia coli     Priority: High  . Status post lumbar spinal fusion     Priority: Medium  . Difficulty in walking, not elsewhere classified   . Uncontrolled type 2 diabetes mellitus with complication ([place])   . Angioedema   . Gout 10/01/2015  . Hyperlipidemia 10/01/2015  . Type 2 diabetes mellitus ([place]) 10/01/2015  . Gait instability 02/19/2013  . Pulmonary hyperinflation 01/17/2013  . Diastolic dysfunction   . Essential hypertension, benign 01/03/2013    Patient's Medications  New Prescriptions   No medications on file  Previous Medications   ALLOPURINOL (ZYLOPRIM) 100 MG TABLET    Take 200 mg by mouth daily.    AMLODIPINE (NORVASC) 10 MG TABLET    Take 10 mg by mouth every morning.    ASPIRIN EC 81 MG TABLET    Take 81 mg by mouth daily.   COLCHICINE (COLCRYS) 0.6 MG TABLET    Take 0.6 mg by mouth daily.    FERROUS SULFATE 325 (65 FE) MG TABLET    Take 1 tablet (325 mg total) by mouth 3 (three) times daily with meals.   FUROSEMIDE (LASIX) 20 MG TABLET    TAKE ONE TABLET BY MOUTH ONCE DAILY   GLIPIZIDE (GLUCOTROL) 5 MG TABLET    Take 5 mg by mouth daily before breakfast.   INSULIN ASPART (NOVOLOG) 100 UNIT/ML INJECTION    Inject 0-5 Units into the skin at bedtime.   INSULIN LISPRO (HUMALOG) 100 UNIT/ML INJECTION    Inject 2-14 Units into the skin 3 (three) times daily before meals. 200-250= 2 units, 251-300= 4 units, 301-350= 6 units, 351-400= 8 units, 401-450= 12 units; IF HIGH GIVE 14 UNITS RECHECK IN 2 HOURS IF >400 /<100 CALL MD   LISINOPRIL (PRINIVIL,ZESTRIL) 40 MG TABLET    Take 40 mg by mouth every morning.    METFORMIN (GLUCOPHAGE) 1000 MG TABLET    Take 1,000 mg by mouth 2 (two) times daily with a meal.     METOPROLOL (LOPRESSOR) 100 MG TABLET    Take 100 mg by mouth  every morning.    OMEPRAZOLE (PRILOSEC) 20 MG CAPSULE    Take 20 mg by mouth daily.   ONDANSETRON (ZOFRAN) 4 MG TABLET    Take 4 mg by mouth every 8 (eight) hours as needed for nausea or vomiting.   OXYCODONE-ACETAMINOPHEN (PERCOCET/ROXICET) 5-325 MG TABLET    Take one tablet by mouth three times daily as needed for pain. Do not exceed 4gm of Tylenol in 24 hours   POLYETHYL GLYCOL-PROPYL GLYCOL (SYSTANE OP)    Apply 1 drop to eye as needed (Dry eyes).   SIMVASTATIN (ZOCOR) 20 MG TABLET    Take 20 mg by mouth every evening.    TRAZODONE (DESYREL) 50 MG TABLET    Take 50-100 mg by mouth at bedtime as needed for sleep.   Modified Medications   No medications on file  Discontinued Medications   ERTAPENEM 1 G IN SODIUM CHLORIDE 0.9 % 50 ML    Inject 1 g into the vein daily.    Subjective: Nancy [name] is in for her hospital follow-up visit. She underwent L4-S1 fusion on 09/17/2015. Postoperatively she developed wound dehiscence and infection. She was readmitted and underwent incision and drainage on 10/02/2015.  An ESBL producing Escherichia coli was grown from specimens taken from her right sided lumbar wound. The operative note stated that there was "no purulent drainage deep to fascia". She was seen by my partner, Dr. Johnnye Sima, who recommended IV ertapenem for 6 weeks. She completed therapy on 11/16/2015. She does not have much memory of her hospitalizations. She is feeling much better now. Her wounds have healed completely. He is having much less back pain and has not required any pain medication for the past 48 hours.  Review of Systems: Review of Systems  Constitutional: Positive for weight loss. Negative for chills, diaphoresis and fever.  Musculoskeletal: Positive for joint pain. Negative for back pain.       Recent gout in her left foot.    Past Medical History:  Diagnosis Date  . Breast cyst    left breast  . Chest pain   . Complication of anesthesia 1986   slow to awaken after  cholecystectomy  . Diastolic dysfunction    with mild pulmonary HTN by echo 07/2011  . Gastric ulcer   . GERD (gastroesophageal reflux disease)   . Gout   . Hyperlipidemia   . Hypertension   . Neuropathy ([place])    from diabetes per patient  . Osteoarthritis   . Osteopenia   . Shortness of breath dyspnea    with activity  . Spine deformity    lower spine  . Type II diabetes mellitus ([place])     Social History  Substance Use Topics  . Smoking status: Never Smoker  . Smokeless tobacco: Never Used  . Alcohol use No     Comment: occasional wine    Family History  Problem Relation Age of Onset  . Heart disease Mother   . Cancer Sister     ovarian    Allergies  Allergen Reactions  . Morphine And Related Swelling    10/02/15 - given Toradol + morphine and RN noted tongue swelling - given Epi + Benadryl Has had morphine in past w/o problem  . Toradol [Ketorolac Tromethamine] Swelling    10/02/15 - given Toradol + morphine and RN noted tongue swelling - given Epi + Benadryl Has taken ibuprofen in past w/o problem  . Codeine Other (See Comments)    headache    Objective: Vitals:   12/18/15 1402  BP: 128/73  Pulse: 65  Temp: 97.6 F (36.4 C)  TempSrc: Oral  Weight: 175 lb (79.4 kg)  Height: 5\' 1"  (1.549 m)   Body mass index is 33.07 kg/m.  Physical Exam  Constitutional: She is oriented to person, place, and time.  She is in good spirits.  Musculoskeletal:  Her 2 lumbar incisions have healed nicely without any evidence of infection.  Neurological: She is alert and oriented to person, place, and time.  She is walking with the aid of a cane because of her recent gout.  Skin: No rash noted.  Psychiatric: Affect normal.    Lab Results    Problem List Items Addressed This Visit      High   Infection due to ESBL-producing Escherichia coli - Primary   Postoperative wound infection    I strongly suspect that her postoperative wound infection has been cured with a  combination of surgery and 6 weeks of IV ertapenem. She continues to improve one month after completing antibiotic therapy. I would not do any further diagnostic evaluation or treatment at this time. She can follow-up here as needed.  Michel Bickers, MD [facility] [phone] pager   [phone] cell 12/18/2015, 2:39 PM

## 2015-12-22 ENCOUNTER — Encounter (HOSPITAL_COMMUNITY): Payer: Self-pay | Admitting: General Surgery

## 2016-01-01 ENCOUNTER — Encounter (INDEPENDENT_AMBULATORY_CARE_PROVIDER_SITE_OTHER): Payer: Self-pay | Admitting: Orthopedic Surgery

## 2016-01-01 ENCOUNTER — Ambulatory Visit (INDEPENDENT_AMBULATORY_CARE_PROVIDER_SITE_OTHER): Payer: Medicare Other | Admitting: Orthopedic Surgery

## 2016-01-01 DIAGNOSIS — M1712 Unilateral primary osteoarthritis, left knee: Secondary | ICD-10-CM

## 2016-01-01 DIAGNOSIS — M170 Bilateral primary osteoarthritis of knee: Secondary | ICD-10-CM | POA: Diagnosis not present

## 2016-01-01 DIAGNOSIS — M1711 Unilateral primary osteoarthritis, right knee: Secondary | ICD-10-CM

## 2016-01-01 MED ORDER — LIDOCAINE HCL 1 % IJ SOLN
5.0000 mL | INTRAMUSCULAR | Status: AC | PRN
  Administered 2016-01-01: 5 mL

## 2016-01-01 MED ORDER — METHYLPREDNISOLONE ACETATE 40 MG/ML IJ SUSP
40.0000 mg | INTRAMUSCULAR | Status: AC | PRN
  Administered 2016-01-01: 40 mg via INTRA_ARTICULAR

## 2016-01-01 MED ORDER — BUPIVACAINE HCL 0.25 % IJ SOLN
4.0000 mL | INTRAMUSCULAR | Status: AC | PRN
  Administered 2016-01-01: 4 mL via INTRA_ARTICULAR

## 2016-01-01 NOTE — Progress Notes (Signed)
Office Visit Note   Patient: Nancy [name]           Date of Birth: 11-01-1940           MRN: [id number] Visit Date: 01/01/2016 Requested by: Antony Blackbird, MD [phone], [place] [address] PCP: Antony Blackbird, MD  Subjective: Chief Complaint  Patient presents with  . Left Knee - Pain  . Right Knee - Pain    HPI Nancy [name] 75 year old patient with bilateral knee arthritis she's walking with a walker.  Recently had spinal fusion surgery in October 2017.  This was complicated by an infection.  She's had 2 cortisone shots in each knee this year.  States her mobility is limited due to pain.  States she has pain in both knees.  No real mechanical symptoms.              Review of Systems All systems reviewed are negative as they relate to the chief complaint within the history of present illness.  Patient denies  fevers or chills.    Assessment & Plan: Visit Diagnoses:  1. Primary osteoarthritis of both knees     Plan: Impression is bilateral knee arthritis.  Plan is aspiration injection of both knees today.  Pre-approve for Monovisc that can be injected when the shots wear off.  I'll see her back at that time.  Physical therapy for nonweightbearing quad strengthening exercises will be initiated in coordination with therapy for her back  Follow-Up Instructions: No Follow-up on file.   Orders:  No orders of the defined types were placed in this encounter.  No orders of the defined types were placed in this encounter.     Procedures: Large Joint Inj Date/Time: 01/01/2016 2:17 PM Performed by: Meredith Pel Authorized by: Meredith Pel   Consent Given by:  Patient Site marked: the procedure site was marked   Timeout: prior to procedure the correct patient, procedure, and site was verified   Indications:  Pain, joint swelling and diagnostic evaluation Location:  Knee Site:  R knee Prep: patient was prepped and draped in usual sterile fashion   Needle  Size:  18 G Needle Length:  1.5 inches Approach:  Superolateral Ultrasound Guidance: No   Fluoroscopic Guidance: No   Arthrogram: No   Medications:  5 mL lidocaine 1 %; 4 mL bupivacaine 0.25 %; 40 mg methylPREDNISolone acetate 40 MG/ML Patient tolerance:  Patient tolerated the procedure well with no immediate complications Large Joint Inj Date/Time: 01/01/2016 2:18 PM Performed by: Meredith Pel Authorized by: Meredith Pel   Consent Given by:  Patient Site marked: the procedure site was marked   Timeout: prior to procedure the correct patient, procedure, and site was verified   Indications:  Pain, joint swelling and diagnostic evaluation Location:  Knee Site:  L knee Prep: patient was prepped and draped in usual sterile fashion   Needle Size:  18 G Needle Length:  1.5 inches Approach:  Superolateral Ultrasound Guidance: No   Fluoroscopic Guidance: No   Arthrogram: No   Medications:  5 mL lidocaine 1 %; 4 mL bupivacaine 0.25 %; 40 mg methylPREDNISolone acetate 40 MG/ML Patient tolerance:  Patient tolerated the procedure well with no immediate complications     Clinical Data: No additional findings.  Objective: Vital Signs: There were no vitals taken for this visit.  Physical Exam   Constitutional: Patient appears well-developed HEENT:  Head: Normocephalic Eyes:EOM are normal Neck: Normal range of motion Cardiovascular: Normal  rate Pulmonary/chest: Effort normal Neurologic: Patient is alert Skin: Skin is warm Psychiatric: Patient has normal mood and affect    Ortho Exam examination of both knees demonstrates about a 5-10 flexion contracture.  Stable collateral ligaments.  Intact extensor mechanism.  Palpable pedal pulses.  Flexion is only to about 90-95 in both knees.  There is no groin pain with internal/external rotation of either leg.  No other masses lymph adenopathy or skin changes noted in the knee region  Specialty Comments:  No specialty  comments available.  Imaging: No results found.   PMFS History: Patient Active Problem List   Diagnosis Date Noted  . Primary osteoarthritis of both knees 01/01/2016  . Postoperative wound infection 12/18/2015  . Difficulty in walking, not elsewhere classified   . Status post lumbar spinal fusion   . Uncontrolled type 2 diabetes mellitus with complication ([place])   . 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 Angioedema   . Gout 10/01/2015  . Hyperlipidemia 10/01/2015  . Type 2 diabetes mellitus ([place]) 10/01/2015  . Gait instability 02/19/2013  . Pulmonary hyperinflation 01/17/2013  . Diastolic dysfunction   . Essential hypertension, benign 01/03/2013   Past Medical History:  Diagnosis Date  . Breast cyst    left breast  . Chest pain   . Complication of anesthesia 1986   slow to awaken after cholecystectomy  . Diastolic dysfunction    with mild pulmonary HTN by echo 07/2011  . Gastric ulcer   . GERD (gastroesophageal reflux disease)   . Gout   . Hyperlipidemia   . Hypertension   . Neuropathy ([place])    from diabetes per patient  . Osteoarthritis   . Osteopenia   . Shortness of breath dyspnea    with activity  . Spine deformity    lower spine  . Type II diabetes mellitus ([facility])     Family History  Problem Relation Age of Onset  . Heart disease Mother   . Cancer Sister     ovarian    Past Surgical History:  Procedure Laterality Date  . ABDOMINAL ADHESION SURGERY    . ABDOMINAL HYSTERECTOMY    . ANUS SURGERY     for torn tissue  . APPENDECTOMY    . BREAST BIOPSY    . BREAST LUMPECTOMY     left  . CHOLECYSTECTOMY  1986  . COLONOSCOPY    . ESOPHAGOGASTRODUODENOSCOPY (EGD) WITH PROPOFOL N/A 07/18/2013   Procedure: ESOPHAGOGASTRODUODENOSCOPY (EGD) WITH PROPOFOL;  Surgeon: Arta Silence, MD;  [facility];  Service: Endoscopy;  Laterality: N/A;  . EUS N/A 07/18/2013   Procedure: ESOPHAGEAL ENDOSCOPIC ULTRASOUND (EUS) RADIAL;  Surgeon: Arta Silence, MD;  [facility];  Service: Endoscopy;  Laterality: N/A;  . IR GENERIC HISTORICAL  10/30/2015   IR US GUIDE VASC ACCESS RIGHT 10/30/2015 Saverio Danker, PA-C [facility]  . IR GENERIC HISTORICAL  10/30/2015   IR FLUORO GUIDE CV LINE RIGHT 10/30/2015 Saverio Danker, PA-C [facility]  . LUMBAR WOUND DEBRIDEMENT N/A 10/01/2015   Procedure: LUMBAR WOUND DEBRIDEMENT;  Surgeon: Melina Schools, MD;  Location: [facility];  Service: Orthopedics;  Laterality: N/A;  . LUMBAR WOUND DEBRIDEMENT N/A 10/15/2015   Procedure: [facility] OUT AND CLOSURE OF BACK WOUND;  Surgeon: Melina Schools, MD;  Location: [place] [facility];  Service: Orthopedics;  Laterality: N/A;  . MASS EXCISION Left 07/24/2015   Procedure: EXCISION MASS LEFT LONG FINGER;  Surgeon: Leanora Cover, MD;  Location: [place];  Service: Orthopedics;  Laterality: Left;  . SPINAL FUSION N/A 09/17/2015   Procedure: FUSION POSTERIOR SPINAL MULTILEVEL L4- S1;  Surgeon: Melina Schools, MD;  Location: [facility];  Service: Orthopedics;  Laterality: N/A;  . TRANSFORAMINAL LUMBAR INTERBODY FUSION (TLIF) WITH PEDICLE SCREW FIXATION 1 LEVEL N/A 09/17/2015   Procedure: TRANSFORAMINAL LUMBAR INTERBODY FUSION (TLIF) WITH PEDICLE SCREW FIXATION 1 LEVEL , Lumbar 4-5;  Surgeon: Melina Schools, MD;  Location: [place];  Service: Orthopedics;  Laterality: N/A;   Social History   Occupational History  . Not on file.   Social History Main Topics  . Smoking status: Never Smoker  . Smokeless tobacco: Never Used  . Alcohol use No     Comment: occasional wine  . Drug use: No  . Sexual activity: Not on file

## 2016-01-02 ENCOUNTER — Other Ambulatory Visit: Payer: Self-pay | Admitting: *Deleted

## 2016-01-02 MED ORDER — FUROSEMIDE 20 MG PO TABS: 20.0000 mg | ORAL_TABLET | Freq: Every day | ORAL | 2 refills | Status: DC

## 2016-01-15 ENCOUNTER — Telehealth: Payer: Self-pay | Admitting: Cardiology

## 2016-01-15 NOTE — Telephone Encounter (Signed)
Patient called to report muscle soreness in both arms, her back, and the top of her chest. The soreness started in her R arm Monday. Tuesday, the upper part of her chest (near her collar bones) was sore. By Wednesday, both arms and her shoulder blades were sore. They are still sore now. She states it feels like she pulled a muscle. The soreness increases when touched and feels "a little better" with [facility] and taking Tylenol 500 mg PRN. She states she is so sore she can hardly sleep at night. She denies CP, SOB, palpitations. She states she "knows" this isn't from her heart, but she called her PCP Tuesday and has not heard back and did not know who else to call. She refuses to go to an Urgent Care because they do not take her insurance.  Informed patient PCP will be called in the AM to get appointment. She understands to seek immediate medical attention if more symptoms (SOB, CP) occur prior to that time.

## 2016-01-15 NOTE — Telephone Encounter (Signed)
Nancy [name] is calling because she stated she has a soreness in the middle of her chest and on both shoulders. No other symptoms reported. Please call. Thanks.

## 2016-01-16 ENCOUNTER — Encounter (INDEPENDENT_AMBULATORY_CARE_PROVIDER_SITE_OTHER): Payer: Self-pay | Admitting: Radiology

## 2016-01-16 NOTE — Progress Notes (Signed)
IC patient and advised that we can proceed with Monovisc injections when she is ready to do so.  She will call us once she gets her back problem situated.   Primary covers 80%, secondary covers the remaining of meds.  Admin is covered at 100%.

## 2016-01-16 NOTE — Telephone Encounter (Signed)
[place] and scheduled patient appointment Monday at 1530. The operator also states [facility] has a walk-in clinic at noon today if she cannot wait. Called patient to confirm appointment. She understands she may go to walk-in clinic today.  She states she will probably do that or go to Urgent Care - she reports she hardly slept because her back hurts so bad now. She reports again it feels like "all the muscles are really tight." She continues to deny any cardiac symptoms (CP, SOB).  She understands to call and cancel PCP appointment Monday if she is evaluated today. She was grateful for assistance.

## 2016-02-11 ENCOUNTER — Encounter: Payer: Self-pay | Admitting: Family

## 2016-02-18 ENCOUNTER — Ambulatory Visit: Payer: Medicare Other

## 2016-03-01 ENCOUNTER — Ambulatory Visit (INDEPENDENT_AMBULATORY_CARE_PROVIDER_SITE_OTHER): Payer: Medicare Other | Admitting: Orthopedic Surgery

## 2016-03-10 ENCOUNTER — Ambulatory Visit (INDEPENDENT_AMBULATORY_CARE_PROVIDER_SITE_OTHER): Payer: Medicare Other | Admitting: Orthopedic Surgery

## 2016-03-10 ENCOUNTER — Encounter (INDEPENDENT_AMBULATORY_CARE_PROVIDER_SITE_OTHER): Payer: Self-pay | Admitting: Orthopedic Surgery

## 2016-03-10 DIAGNOSIS — M1711 Unilateral primary osteoarthritis, right knee: Secondary | ICD-10-CM

## 2016-03-10 DIAGNOSIS — M170 Bilateral primary osteoarthritis of knee: Secondary | ICD-10-CM

## 2016-03-10 DIAGNOSIS — M1712 Unilateral primary osteoarthritis, left knee: Secondary | ICD-10-CM

## 2016-03-10 MED ORDER — HYALURONAN 88 MG/4ML IX SOSY
88.0000 mg | PREFILLED_SYRINGE | INTRA_ARTICULAR | Status: AC | PRN
  Administered 2016-03-10: 88 mg via INTRA_ARTICULAR

## 2016-03-10 MED ORDER — LIDOCAINE HCL 1 % IJ SOLN
5.0000 mL | INTRAMUSCULAR | Status: AC | PRN
  Administered 2016-03-10: 5 mL

## 2016-03-10 NOTE — Progress Notes (Signed)
   Procedure Note  Patient: Nancy [name]             Date of Birth: Dec 17, 1940           MRN: [id number]             Visit Date: 03/10/2016  Procedures: Visit Diagnoses: Primary osteoarthritis of both knees  Large Joint Inj Date/Time: 03/10/2016 11:25 AM Performed by: Meredith Pel Authorized by: Meredith Pel   Consent Given by:  Patient Site marked: the procedure site was marked   Timeout: prior to procedure the correct patient, procedure, and site was verified   Indications:  Pain, joint swelling and diagnostic evaluation Location:  Knee Site:  R knee Prep: patient was prepped and draped in usual sterile fashion   Needle Size:  18 G Needle Length:  1.5 inches Approach:  Superolateral Ultrasound Guidance: No   Fluoroscopic Guidance: No   Arthrogram: No   Medications:  5 mL lidocaine 1 %; 88 mg Hyaluronan 88 MG/4ML Patient tolerance:  Patient tolerated the procedure well with no immediate complications Large Joint Inj Date/Time: 03/10/2016 11:26 AM Performed by: Meredith Pel Authorized by: Meredith Pel   Consent Given by:  Patient Site marked: the procedure site was marked   Timeout: prior to procedure the correct patient, procedure, and site was verified   Indications:  Pain, joint swelling and diagnostic evaluation Location:  Knee Site:  L knee Prep: patient was prepped and draped in usual sterile fashion   Needle Size:  18 G Needle Length:  1.5 inches Approach:  Superolateral Ultrasound Guidance: No   Fluoroscopic Guidance: No   Arthrogram: No   Medications:  5 mL lidocaine 1 %; 88 mg Hyaluronan 88 MG/4ML Patient tolerance:  Patient tolerated the procedure well with no immediate complications

## 2016-03-11 ENCOUNTER — Ambulatory Visit: Payer: Medicare Other | Admitting: Podiatry

## 2016-03-11 ENCOUNTER — Telehealth (INDEPENDENT_AMBULATORY_CARE_PROVIDER_SITE_OTHER): Payer: Self-pay | Admitting: Orthopedic Surgery

## 2016-03-11 MED ORDER — TRAMADOL HCL 50 MG PO TABS: 50.0000 mg | ORAL_TABLET | Freq: Four times a day (QID) | ORAL | 0 refills | Status: DC | PRN

## 2016-03-11 NOTE — Telephone Encounter (Signed)
Rx request, pls advise.  

## 2016-03-11 NOTE — Telephone Encounter (Signed)
Patient called really upset in a lot of pain. She is wondering if she can be prescribed something to help with the pain in her leg. Cb # [id number]

## 2016-03-11 NOTE — Telephone Encounter (Signed)
Okay for Tylenol 3 one by mouth every 6 hours when necessary pain #15 with no refill

## 2016-03-11 NOTE — Telephone Encounter (Signed)
Patient request something for pain,states she's in severe pain since her injection on 03/10/16.

## 2016-03-11 NOTE — Telephone Encounter (Signed)
IC pharm LMVM with Rx, IC pt and advised per Dr Marlou Sa, tramadol since she is allergic to codeine, she will continue to ice and elevate and call if this does not resolve.

## 2016-03-11 NOTE — Addendum Note (Signed)
Addended byBrand Males on: 03/11/2016 03:45 PM   Modules accepted: Orders

## 2016-03-11 NOTE — Telephone Encounter (Signed)
IC patient and discussed, see other msg

## 2016-03-12 ENCOUNTER — Telehealth (INDEPENDENT_AMBULATORY_CARE_PROVIDER_SITE_OTHER): Payer: Self-pay | Admitting: Orthopedic Surgery

## 2016-03-12 NOTE — Telephone Encounter (Signed)
Patient calling again stating she is in severe pain, both legs are swollen and tramadol is not touching the pain.  Please call and advise

## 2016-03-13 ENCOUNTER — Emergency Department (HOSPITAL_COMMUNITY): Payer: Medicare Other

## 2016-03-13 ENCOUNTER — Encounter (HOSPITAL_COMMUNITY): Payer: Self-pay | Admitting: *Deleted

## 2016-03-13 ENCOUNTER — Inpatient Hospital Stay (HOSPITAL_COMMUNITY)
Admission: EM | Admit: 2016-03-13 | Discharge: 2016-03-16 | DRG: 554 | Disposition: A | Discharge status: Home / Self Care | Payer: Medicare Other | Attending: Internal Medicine | Admitting: Internal Medicine

## 2016-03-13 DIAGNOSIS — M25461 Effusion, right knee: Secondary | ICD-10-CM | POA: Diagnosis present

## 2016-03-13 DIAGNOSIS — R700 Elevated erythrocyte sedimentation rate: Secondary | ICD-10-CM | POA: Diagnosis not present

## 2016-03-13 DIAGNOSIS — E1165 Type 2 diabetes mellitus with hyperglycemia: Secondary | ICD-10-CM | POA: Diagnosis present

## 2016-03-13 DIAGNOSIS — R6510 Systemic inflammatory response syndrome (SIRS) of non-infectious origin without acute organ dysfunction: Secondary | ICD-10-CM | POA: Diagnosis not present

## 2016-03-13 DIAGNOSIS — Z90710 Acquired absence of both cervix and uterus: Secondary | ICD-10-CM

## 2016-03-13 DIAGNOSIS — G8929 Other chronic pain: Secondary | ICD-10-CM | POA: Diagnosis present

## 2016-03-13 DIAGNOSIS — M109 Gout, unspecified: Secondary | ICD-10-CM | POA: Diagnosis present

## 2016-03-13 DIAGNOSIS — E1140 Type 2 diabetes mellitus with diabetic neuropathy, unspecified: Secondary | ICD-10-CM | POA: Diagnosis present

## 2016-03-13 DIAGNOSIS — D72829 Elevated white blood cell count, unspecified: Secondary | ICD-10-CM | POA: Diagnosis present

## 2016-03-13 DIAGNOSIS — Z7984 Long term (current) use of oral hypoglycemic drugs: Secondary | ICD-10-CM

## 2016-03-13 DIAGNOSIS — M1990 Unspecified osteoarthritis, unspecified site: Secondary | ICD-10-CM

## 2016-03-13 DIAGNOSIS — M1000 Idiopathic gout, unspecified site: Secondary | ICD-10-CM | POA: Diagnosis not present

## 2016-03-13 DIAGNOSIS — E1169 Type 2 diabetes mellitus with other specified complication: Secondary | ICD-10-CM

## 2016-03-13 DIAGNOSIS — D649 Anemia, unspecified: Secondary | ICD-10-CM | POA: Diagnosis present

## 2016-03-13 DIAGNOSIS — Z981 Arthrodesis status: Secondary | ICD-10-CM

## 2016-03-13 DIAGNOSIS — M25462 Effusion, left knee: Secondary | ICD-10-CM

## 2016-03-13 DIAGNOSIS — Z7982 Long term (current) use of aspirin: Secondary | ICD-10-CM

## 2016-03-13 DIAGNOSIS — I10 Essential (primary) hypertension: Secondary | ICD-10-CM | POA: Diagnosis not present

## 2016-03-13 DIAGNOSIS — M542 Cervicalgia: Secondary | ICD-10-CM | POA: Diagnosis not present

## 2016-03-13 DIAGNOSIS — Y92239 Unspecified place in hospital as the place of occurrence of the external cause: Secondary | ICD-10-CM | POA: Diagnosis not present

## 2016-03-13 DIAGNOSIS — M549 Dorsalgia, unspecified: Secondary | ICD-10-CM

## 2016-03-13 DIAGNOSIS — E1142 Type 2 diabetes mellitus with diabetic polyneuropathy: Secondary | ICD-10-CM | POA: Diagnosis present

## 2016-03-13 DIAGNOSIS — M170 Bilateral primary osteoarthritis of knee: Secondary | ICD-10-CM | POA: Diagnosis present

## 2016-03-13 DIAGNOSIS — Z886 Allergy status to analgesic agent status: Secondary | ICD-10-CM

## 2016-03-13 DIAGNOSIS — E119 Type 2 diabetes mellitus without complications: Secondary | ICD-10-CM

## 2016-03-13 DIAGNOSIS — Z79899 Other long term (current) drug therapy: Secondary | ICD-10-CM

## 2016-03-13 DIAGNOSIS — M11261 Other chondrocalcinosis, right knee: Secondary | ICD-10-CM | POA: Diagnosis not present

## 2016-03-13 DIAGNOSIS — T380X5A Adverse effect of glucocorticoids and synthetic analogues, initial encounter: Secondary | ICD-10-CM | POA: Diagnosis not present

## 2016-03-13 DIAGNOSIS — E785 Hyperlipidemia, unspecified: Secondary | ICD-10-CM | POA: Diagnosis present

## 2016-03-13 DIAGNOSIS — I272 Pulmonary hypertension, unspecified: Secondary | ICD-10-CM | POA: Diagnosis present

## 2016-03-13 DIAGNOSIS — E872 Acidosis: Secondary | ICD-10-CM | POA: Diagnosis present

## 2016-03-13 DIAGNOSIS — M11262 Other chondrocalcinosis, left knee: Secondary | ICD-10-CM | POA: Diagnosis present

## 2016-03-13 DIAGNOSIS — K219 Gastro-esophageal reflux disease without esophagitis: Secondary | ICD-10-CM | POA: Diagnosis present

## 2016-03-13 DIAGNOSIS — Z885 Allergy status to narcotic agent status: Secondary | ICD-10-CM

## 2016-03-13 HISTORY — DX: Allergy, unspecified, initial encounter: T78.40XA

## 2016-03-13 LAB — CBC WITH DIFFERENTIAL/PLATELET
Basophils Absolute: 0 10*3/uL (ref 0.0–0.1)
Basophils Relative: 0 %
EOS PCT: 0 %
Eosinophils Absolute: 0 10*3/uL (ref 0.0–0.7)
HEMATOCRIT: 31 % — AB (ref 36.0–46.0)
Hemoglobin: 10.9 g/dL — ABNORMAL LOW (ref 12.0–15.0)
LYMPHS ABS: 1.5 10*3/uL (ref 0.7–4.0)
Lymphocytes Relative: 9 %
MCH: 27.9 pg (ref 26.0–34.0)
MCHC: 35.2 g/dL (ref 30.0–36.0)
MCV: 79.3 fL (ref 78.0–100.0)
MONO ABS: 1.5 10*3/uL — AB (ref 0.1–1.0)
MONOS PCT: 9 %
NEUTROS ABS: 13.6 10*3/uL — AB (ref 1.7–7.7)
Neutrophils Relative %: 82 %
PLATELETS: 526 10*3/uL — AB (ref 150–400)
RBC: 3.91 MIL/uL (ref 3.87–5.11)
RDW: 19.9 % — AB (ref 11.5–15.5)
WBC: 16.6 10*3/uL — AB (ref 4.0–10.5)

## 2016-03-13 LAB — I-STAT TROPONIN, ED: TROPONIN I, POC: 0.01 ng/mL (ref 0.00–0.08)

## 2016-03-13 LAB — COMPREHENSIVE METABOLIC PANEL
ALBUMIN: 3 g/dL — AB (ref 3.5–5.0)
ALT: 13 U/L — AB (ref 14–54)
AST: 26 U/L (ref 15–41)
Alkaline Phosphatase: 74 U/L (ref 38–126)
Anion gap: 10 (ref 5–15)
BUN: 11 mg/dL (ref 6–20)
CHLORIDE: 99 mmol/L — AB (ref 101–111)
CO2: 23 mmol/L (ref 22–32)
Calcium: 9.1 mg/dL (ref 8.9–10.3)
Creatinine, Ser: 0.93 mg/dL (ref 0.44–1.00)
GFR calc Af Amer: >60 mL/min (ref >60)
GFR, EST NON AFRICAN AMERICAN: 59 mL/min — AB (ref >60)
Glucose, Bld: 196 mg/dL — ABNORMAL HIGH (ref 65–99)
POTASSIUM: 3.8 mmol/L (ref 3.5–5.1)
SODIUM: 132 mmol/L — AB (ref 135–145)
Total Bilirubin: 1.7 mg/dL — ABNORMAL HIGH (ref 0.3–1.2)
Total Protein: 8.3 g/dL — ABNORMAL HIGH (ref 6.5–8.1)

## 2016-03-13 LAB — SYNOVIAL CELL COUNT + DIFF, W/ CRYSTALS
Eosinophils-Synovial: 0 % (ref 0–1)
LYMPHOCYTES-SYNOVIAL FLD: 3 % (ref 0–20)
MONOCYTE-MACROPHAGE-SYNOVIAL FLUID: 0 % — AB (ref 50–90)
Neutrophil, Synovial: 97 % — ABNORMAL HIGH (ref 0–25)
WBC, SYNOVIAL: 19657 /mm3 — AB (ref 0–200)

## 2016-03-13 LAB — SEDIMENTATION RATE: SED RATE: 65 mm/h — AB (ref 0–22)

## 2016-03-13 LAB — I-STAT CG4 LACTIC ACID, ED
LACTIC ACID, VENOUS: 2.2 mmol/L — AB (ref 0.5–1.9)
Lactic Acid, Venous: 2.96 mmol/L (ref 0.5–1.9)

## 2016-03-13 LAB — LACTIC ACID, PLASMA: LACTIC ACID, VENOUS: 2 mmol/L — AB (ref 0.5–1.9)

## 2016-03-13 LAB — URIC ACID: URIC ACID, SERUM: 3.6 mg/dL (ref 2.3–6.6)

## 2016-03-13 LAB — URINALYSIS, ROUTINE W REFLEX MICROSCOPIC
Bilirubin Urine: NEGATIVE
Glucose, UA: NEGATIVE mg/dL
HGB URINE DIPSTICK: NEGATIVE
Ketones, ur: NEGATIVE mg/dL
LEUKOCYTES UA: NEGATIVE
NITRITE: NEGATIVE
PROTEIN: NEGATIVE mg/dL
SPECIFIC GRAVITY, URINE: 1.014 (ref 1.005–1.030)
pH: 5 (ref 5.0–8.0)

## 2016-03-13 LAB — PROTEIN, PLEURAL OR PERITONEAL FLUID: Total protein, fluid: 5 g/dL

## 2016-03-13 LAB — TROPONIN I

## 2016-03-13 LAB — CBG MONITORING, ED: Glucose-Capillary: 164 mg/dL — ABNORMAL HIGH (ref 65–99)

## 2016-03-13 LAB — GLUCOSE, PLEURAL OR PERITONEAL FLUID: GLUCOSE FL: 155 mg/dL

## 2016-03-13 MED ORDER — ONDANSETRON HCL 4 MG/2ML IJ SOLN
4.0000 mg | Freq: Once | INTRAMUSCULAR | Status: AC
  Administered 2016-03-13: 4 mg via INTRAVENOUS
  Filled 2016-03-13: qty 2

## 2016-03-13 MED ORDER — ONDANSETRON HCL 4 MG PO TABS: 4.0000 mg | ORAL_TABLET | Freq: Four times a day (QID) | ORAL | Status: DC | PRN

## 2016-03-13 MED ORDER — ALLOPURINOL 100 MG PO TABS
200.0000 mg | ORAL_TABLET | Freq: Every day | ORAL | Status: DC
  Administered 2016-03-14 – 2016-03-16 (×3): 200 mg via ORAL
  Filled 2016-03-13 (×3): qty 2

## 2016-03-13 MED ORDER — FUROSEMIDE 40 MG PO TABS
20.0000 mg | ORAL_TABLET | Freq: Every day | ORAL | Status: DC
  Administered 2016-03-14 – 2016-03-16 (×3): 20 mg via ORAL
  Filled 2016-03-13 (×3): qty 1

## 2016-03-13 MED ORDER — AMLODIPINE BESYLATE 5 MG PO TABS
10.0000 mg | ORAL_TABLET | Freq: Every morning | ORAL | Status: DC
  Administered 2016-03-14 – 2016-03-16 (×3): 10 mg via ORAL
  Filled 2016-03-13 (×3): qty 2

## 2016-03-13 MED ORDER — OXYCODONE HCL 5 MG PO TABS
5.0000 mg | ORAL_TABLET | Freq: Four times a day (QID) | ORAL | Status: DC | PRN
  Administered 2016-03-14 – 2016-03-15 (×4): 5 mg via ORAL
  Filled 2016-03-13 (×4): qty 1

## 2016-03-13 MED ORDER — HYDROMORPHONE HCL 1 MG/ML IJ SOLN
0.5000 mg | INTRAMUSCULAR | Status: DC | PRN
  Administered 2016-03-14 (×2): 0.5 mg via INTRAVENOUS
  Filled 2016-03-13 (×2): qty 0.5

## 2016-03-13 MED ORDER — LIDOCAINE HCL 2 % IJ SOLN
10.0000 mL | Freq: Once | INTRAMUSCULAR | Status: AC
  Administered 2016-03-13: 200 mg
  Filled 2016-03-13: qty 20

## 2016-03-13 MED ORDER — ALBUTEROL SULFATE (2.5 MG/3ML) 0.083% IN NEBU: 2.5000 mg | INHALATION_SOLUTION | RESPIRATORY_TRACT | Status: DC | PRN

## 2016-03-13 MED ORDER — FENTANYL CITRATE (PF) 100 MCG/2ML IJ SOLN
25.0000 ug | Freq: Once | INTRAMUSCULAR | Status: AC
  Administered 2016-03-13: 25 ug via INTRAVENOUS
  Filled 2016-03-13: qty 2

## 2016-03-13 MED ORDER — LOSARTAN POTASSIUM 50 MG PO TABS
50.0000 mg | ORAL_TABLET | Freq: Every day | ORAL | Status: DC
  Administered 2016-03-14 – 2016-03-16 (×3): 50 mg via ORAL
  Filled 2016-03-13 (×3): qty 1

## 2016-03-13 MED ORDER — HYDROMORPHONE HCL 1 MG/ML IJ SOLN
1.0000 mg | Freq: Once | INTRAMUSCULAR | Status: AC
  Administered 2016-03-13: 1 mg via INTRAVENOUS
  Filled 2016-03-13: qty 1

## 2016-03-13 MED ORDER — ACETAMINOPHEN 650 MG RE SUPP: 650.0000 mg | Freq: Four times a day (QID) | RECTAL | Status: DC | PRN

## 2016-03-13 MED ORDER — POLYVINYL ALCOHOL 1.4 % OP SOLN
1.0000 [drp] | OPHTHALMIC | Status: DC | PRN
  Filled 2016-03-13: qty 15

## 2016-03-13 MED ORDER — SODIUM CHLORIDE 0.9 % IV BOLUS (SEPSIS)
1000.0000 mL | Freq: Once | INTRAVENOUS | Status: AC
  Administered 2016-03-13: 1000 mL via INTRAVENOUS

## 2016-03-13 MED ORDER — SIMVASTATIN 20 MG PO TABS
20.0000 mg | ORAL_TABLET | Freq: Every evening | ORAL | Status: DC
  Administered 2016-03-14: 20 mg via ORAL
  Filled 2016-03-13: qty 2
  Filled 2016-03-13: qty 1

## 2016-03-13 MED ORDER — INSULIN ASPART 100 UNIT/ML ~~LOC~~ SOLN: 0.0000 [IU] | Freq: Every day | SUBCUTANEOUS | Status: DC

## 2016-03-13 MED ORDER — ONDANSETRON HCL 4 MG/2ML IJ SOLN: 4.0000 mg | Freq: Four times a day (QID) | INTRAMUSCULAR | Status: DC | PRN

## 2016-03-13 MED ORDER — NITROGLYCERIN 0.4 MG SL SUBL
0.4000 mg | SUBLINGUAL_TABLET | SUBLINGUAL | Status: DC | PRN
  Filled 2016-03-13: qty 1

## 2016-03-13 MED ORDER — ASPIRIN 81 MG PO CHEW
243.0000 mg | CHEWABLE_TABLET | Freq: Once | ORAL | Status: DC
  Filled 2016-03-13 (×2): qty 3

## 2016-03-13 MED ORDER — COLCHICINE 0.6 MG PO TABS
0.6000 mg | ORAL_TABLET | Freq: Two times a day (BID) | ORAL | Status: DC
  Administered 2016-03-14 – 2016-03-16 (×6): 0.6 mg via ORAL
  Filled 2016-03-13 (×7): qty 1

## 2016-03-13 MED ORDER — ENOXAPARIN SODIUM 40 MG/0.4ML ~~LOC~~ SOLN
40.0000 mg | Freq: Every day | SUBCUTANEOUS | Status: DC
  Administered 2016-03-14 – 2016-03-15 (×3): 40 mg via SUBCUTANEOUS
  Filled 2016-03-13 (×3): qty 0.4

## 2016-03-13 MED ORDER — INSULIN ASPART 100 UNIT/ML ~~LOC~~ SOLN
0.0000 [IU] | Freq: Three times a day (TID) | SUBCUTANEOUS | Status: DC
  Administered 2016-03-14: 2 [IU] via SUBCUTANEOUS
  Administered 2016-03-14: 5 [IU] via SUBCUTANEOUS
  Administered 2016-03-14: 1 [IU] via SUBCUTANEOUS

## 2016-03-13 MED ORDER — METOPROLOL TARTRATE 25 MG PO TABS
100.0000 mg | ORAL_TABLET | Freq: Every morning | ORAL | Status: DC
  Administered 2016-03-14 – 2016-03-16 (×3): 100 mg via ORAL
  Filled 2016-03-13 (×3): qty 4

## 2016-03-13 MED ORDER — SODIUM CHLORIDE 0.9 % IV SOLN
INTRAVENOUS | Status: DC
  Administered 2016-03-13: via INTRAVENOUS

## 2016-03-13 MED ORDER — ASPIRIN EC 81 MG PO TBEC
81.0000 mg | DELAYED_RELEASE_TABLET | Freq: Every day | ORAL | Status: DC
  Administered 2016-03-14 – 2016-03-16 (×3): 81 mg via ORAL
  Filled 2016-03-13 (×3): qty 1

## 2016-03-13 MED ORDER — METHYLPREDNISOLONE SODIUM SUCC 125 MG IJ SOLR
125.0000 mg | Freq: Once | INTRAMUSCULAR | Status: DC
  Filled 2016-03-13: qty 2

## 2016-03-13 MED ORDER — ACETAMINOPHEN 325 MG PO TABS
650.0000 mg | ORAL_TABLET | Freq: Four times a day (QID) | ORAL | Status: DC | PRN
  Administered 2016-03-14: 650 mg via ORAL
  Filled 2016-03-13: qty 2

## 2016-03-13 NOTE — ED Provider Notes (Signed)
[facility] DEPT Provider Note   CSN: [id number] Arrival date & time: 03/13/16  1330     History   Chief Complaint Chief Complaint  Patient presents with  . Back Pain    HPI Nancy [name] is a 76 y.o. female hx of GERD, HL, DM, here with back pain, Knee pain, chills. Patient has been having chronic back pain and knee pain for several months. Patient actually had lumbar fusion in October last year subsequently had developed a wound infection and has been on antibiotics for 6 weeks. Patient states that she been suffering from pain since then. She was only on tramadol at home but still had a lot of pain over the last week or so. She follows up with Dr. Marlou Sa from or so for her knees and had gel injection of bilateral knees 3 days ago. Patient states that since then, her knee pain has gotten worse. She also has some upper back pain and bilateral shoulder pain that are new. She called Dr. Marlou Sa and was prescribed Tylenol #3 but has not helped her pain. Since yesterday, she developed low-grade temperature about 100 at home. Has worsening bilateral knee pain and worse swelling R knee. She states that its difficult for her to walk due to pain but denies any numbness or weakness.   The history is provided by the patient.    Past Medical History:  Diagnosis Date  . Breast cyst    left breast  . Chest pain   . Complication of anesthesia 1986   slow to awaken after cholecystectomy  . Diastolic dysfunction    with mild pulmonary HTN by echo 07/2011  . Gastric ulcer   . GERD (gastroesophageal reflux disease)   . Gout   . Hyperlipidemia   . Hypertension   . Neuropathy ([place])    from diabetes per patient  . Osteoarthritis   . Osteopenia   . Shortness of breath dyspnea    with activity  . Spine deformity    lower spine  . Type II diabetes mellitus [facility])     Patient Active Problem List   Diagnosis Date Noted  . Primary osteoarthritis of both knees 01/01/2016  . Postoperative wound  infection 12/18/2015  . Difficulty in walking, not elsewhere classified   . Status post lumbar spinal fusion   . Uncontrolled type 2 diabetes mellitus with complication ([place])   . 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 Angioedema   . Gout 10/01/2015  . Hyperlipidemia 10/01/2015  . Type 2 diabetes mellitus ([place]) 10/01/2015  . Gait instability 02/19/2013  . Pulmonary hyperinflation 01/17/2013  . Diastolic dysfunction   . Essential hypertension, benign 01/03/2013    Past Surgical History:  Procedure Laterality Date  . ABDOMINAL ADHESION SURGERY    . ABDOMINAL HYSTERECTOMY    . ANUS SURGERY     for torn tissue  . APPENDECTOMY    . BREAST BIOPSY    . BREAST LUMPECTOMY     left  . CHOLECYSTECTOMY  1986  . COLONOSCOPY    . ESOPHAGOGASTRODUODENOSCOPY (EGD) WITH PROPOFOL N/A 07/18/2013   Procedure: ESOPHAGOGASTRODUODENOSCOPY (EGD) WITH PROPOFOL;  Surgeon: Arta Silence, MD;  Location: [facility];  Service: Endoscopy;  Laterality: N/A;  . EUS N/A 07/18/2013   Procedure: ESOPHAGEAL ENDOSCOPIC ULTRASOUND (EUS) RADIAL;  Surgeon: Arta Silence, MD;  Location: [facility];  Service: Endoscopy;  Laterality: N/A;  . IR GENERIC HISTORICAL  10/30/2015   IR US GUIDE VASC ACCESS RIGHT 10/30/2015 Saverio Danker, PA-C  [facility]  . IR GENERIC HISTORICAL  10/30/2015   IR FLUORO GUIDE CV LINE RIGHT 10/30/2015 Saverio Danker, PA-C [facility]  . LUMBAR WOUND DEBRIDEMENT N/A 10/01/2015   Procedure: LUMBAR WOUND DEBRIDEMENT;  Surgeon: Melina Schools, MD;  Location: [facility];  Service: Orthopedics;  Laterality: N/A;  . LUMBAR WOUND DEBRIDEMENT N/A 10/15/2015   Procedure: [facility] OUT AND CLOSURE OF BACK WOUND;  Surgeon: Melina Schools, MD;  Location: [facility];  Service: Orthopedics;  Laterality: N/A;  . MASS EXCISION Left 07/24/2015   Procedure: EXCISION MASS LEFT LONG FINGER;  Surgeon: Leanora Cover, MD;  Location: [facility];  Service: Orthopedics;  Laterality: Left;  . SPINAL FUSION N/A  09/17/2015   Procedure: FUSION POSTERIOR SPINAL MULTILEVEL L4- S1;  Surgeon: Melina Schools, MD;  Location: [facility];  Service: Orthopedics;  Laterality: N/A;  . TRANSFORAMINAL LUMBAR INTERBODY FUSION (TLIF) WITH PEDICLE SCREW FIXATION 1 LEVEL N/A 09/17/2015   Procedure: TRANSFORAMINAL LUMBAR INTERBODY FUSION (TLIF) WITH PEDICLE SCREW FIXATION 1 LEVEL , Lumbar 4-5;  Surgeon: Melina Schools, MD;  Location: [facility];  Service: Orthopedics;  Laterality: N/A;    OB History    No data available       Home Medications    Prior to Admission medications   Medication Sig Start Date End Date Taking? Authorizing Provider  allopurinol (ZYLOPRIM) 100 MG tablet Take 200 mg by mouth daily.    Yes Historical Provider, MD  amLODipine (NORVASC) 10 MG tablet Take 10 mg by mouth every morning.    Yes Historical Provider, MD  aspirin EC 81 MG tablet Take 81 mg by mouth daily.   Yes Historical Provider, MD  colchicine (COLCRYS) 0.6 MG tablet Take 0.6 mg by mouth daily as needed (Gout).    Yes Historical Provider, MD  furosemide (LASIX) 20 MG tablet Take 1 tablet (20 mg total) by mouth daily. 01/02/16  Yes Sueanne Margarita, MD  GAVILYTE-N WITH FLAVOR PACK 420 g solution Take 1 Package by mouth as directed. 02/27/16  Yes Historical Provider, MD  glipiZIDE (GLUCOTROL) 5 MG tablet Take 5 mg by mouth daily before breakfast.   Yes Historical Provider, MD  losartan (COZAAR) 50 MG tablet Take 50 mg by mouth daily. 12/30/15  Yes Historical Provider, MD  metFORMIN (GLUCOPHAGE) 1000 MG tablet Take 1,000 mg by mouth 2 (two) times daily with a meal.     Yes Historical Provider, MD  metoprolol (LOPRESSOR) 100 MG tablet Take 100 mg by mouth every morning.    Yes Historical Provider, MD  ondansetron (ZOFRAN) 4 MG tablet Take 4 mg by mouth every 8 (eight) hours as needed for nausea or vomiting.   Yes Historical Provider, MD  Polyethyl Glycol-Propyl Glycol (SYSTANE OP) Apply 1 drop to eye as needed (Dry eyes).   Yes Historical Provider, MD    simvastatin (ZOCOR) 20 MG tablet Take 20 mg by mouth every evening.    Yes Historical Provider, MD  traMADol (ULTRAM) 50 MG tablet Take 1 tablet (50 mg total) by mouth every 6 (six) hours as needed. 03/11/16  Yes Scott Marcene Duos, MD  traZODone (DESYREL) 50 MG tablet Take 50-100 mg by mouth at bedtime as needed for sleep.    Yes Historical Provider, MD  ferrous sulfate 325 (65 FE) MG tablet Take 1 tablet (325 mg total) by mouth 3 (three) times daily with meals. Patient not taking: Reported on 03/13/2016 10/07/15   [facility], PA-C  insulin aspart (NOVOLOG) 100 UNIT/ML injection Inject 0-5  Units into the skin at bedtime. Patient not taking: Reported on 03/13/2016 10/07/15   [facility], PA-C  oxyCODONE-acetaminophen (PERCOCET/ROXICET) 5-325 MG tablet Take one tablet by mouth three times daily as needed for pain. Do not exceed 4gm of Tylenol in 24 hours Patient not taking: Reported on 03/13/2016 10/22/15   Estill Dooms, MD    Family History Family History  Problem Relation Age of Onset  . Heart disease Mother   . Cancer Sister     ovarian    Social History Social History  Substance Use Topics  . Smoking status: Never Smoker  . Smokeless tobacco: Never Used  . Alcohol use No     Comment: occasional wine     Allergies   Morphine and related; Toradol [ketorolac tromethamine]; and Codeine   Review of Systems Review of Systems  Musculoskeletal:       Bilateral knee and shoulder and back pain   All other systems reviewed and are negative.    Physical Exam Updated Vital Signs BP 143/70 (BP Location: Right Arm)   Pulse 81   Temp 99 F (37.2 C) (Oral)   Resp 20   Ht 5' 1.5" (1.562 m)   Wt 175 lb (79.4 kg)   SpO2 96%   BMI 32.53 kg/m   Physical Exam  Constitutional:  Chronically ill, uncomfortable   HENT:  Head: Normocephalic.  Mouth/Throat: Oropharynx is clear and moist.  Eyes: EOM are normal. Pupils are equal, round, and reactive to light.  Neck:  Normal range of motion. Neck supple.  Cardiovascular: Normal rate, regular rhythm and normal heart sounds.   Pulmonary/Chest: Effort normal and breath sounds normal. No respiratory distress. She has no wheezes. She has no rales.  Abdominal: Soft. Bowel sounds are normal. She exhibits no distension. There is no tenderness. There is no guarding.  Musculoskeletal:  Mild paralumbar and parathoracic tenderness, no obvious midline tenderness. Bilateral knee effusion, recent knee injection site with no obvious cellulitis. R knee effusion larger than left, dec ROM bilateral knees. Neurovascular intact lower extremities. Nl ROM bilateral shoulders, upper extremity exam unremarkable   Skin: Skin is warm.  Psychiatric: She has a normal mood and affect.  Nursing note and vitals reviewed.    ED Treatments / Results  Labs (all labs ordered are listed, but only abnormal results are displayed) Labs Reviewed  CBC WITH DIFFERENTIAL/PLATELET - Abnormal; Notable for the following:       Result Value   WBC 16.6 (*)    Hemoglobin 10.9 (*)    HCT 31.0 (*)    RDW 19.9 (*)    Platelets 526 (*)    Neutro Abs 13.6 (*)    Monocytes Absolute 1.5 (*)    All other components within normal limits  URINALYSIS, ROUTINE W REFLEX MICROSCOPIC - Abnormal; Notable for the following:    APPearance HAZY (*)    All other components within normal limits  SEDIMENTATION RATE - Abnormal; Notable for the following:    Sed Rate 65 (*)    All other components within normal limits  COMPREHENSIVE METABOLIC PANEL - Abnormal; Notable for the following:    Sodium 132 (*)    Chloride 99 (*)    Glucose, Bld 196 (*)    Total Protein 8.3 (*)    Albumin 3.0 (*)    ALT 13 (*)    Total Bilirubin 1.7 (*)    GFR calc non Af Amer 59 (*)    All other components within normal limits  SYNOVIAL CELL COUNT + DIFF, W/ CRYSTALS - Abnormal; Notable for the following:    Appearance-Synovial CLOUDY (*)    WBC, Synovial 19,657 (*)    Neutrophil,  Synovial 97 (*)    Monocyte-Macrophage-Synovial Fluid 0 (*)    All other components within normal limits  I-STAT CG4 LACTIC ACID, ED - Abnormal; Notable for the following:    Lactic Acid, Venous 2.96 (*)    All other components within normal limits  I-STAT CG4 LACTIC ACID, ED - Abnormal; Notable for the following:    Lactic Acid, Venous 2.20 (*)    All other components within normal limits  CULTURE, BLOOD (ROUTINE X 2)  CULTURE, BLOOD (ROUTINE X 2)  URINE CULTURE  CULTURE, BODY FLUID-BOTTLE  GRAM STAIN  GLUCOSE, PLEURAL OR PERITONEAL FLUID  PROTEIN, PLEURAL OR PERITONEAL FLUID  GLUCOSE, SYNOVIAL FLUID  PROTEIN, SYNOVIAL FLUID  I-STAT TROPOININ, ED    EKG  EKG Interpretation  Date/Time:  Saturday March 13 2016 19:52:19 EST Ventricular Rate:  86 PR Interval:    QRS Duration: 87 QT Interval:  364 QTC Calculation: 436 R Axis:   -7 Text Interpretation:  Sinus rhythm Low voltage, precordial leads Minimal ST elevation, inferior leads Baseline wander in lead(s) V3 No significant change since last tracing Confirmed by Terri Malerba  MD, Chakira Jachim ([id number]) on 03/13/2016 8:05:29 PM       Radiology Dg Chest 2 View  Result Date: 03/13/2016 CLINICAL DATA:  Shortness of breath since yesterday. EXAM: CHEST  2 VIEW COMPARISON:  Chest x-ray dated 07/09/2011. FINDINGS: Study is slightly hypoinspiratory with crowding of the perihilar bronchovascular markings. Given the low lung volumes, lungs appear clear. No pleural effusion or pneumothorax seen. Cardiomediastinal silhouette appears stable. Atherosclerotic changes again noted at the aortic arch. No acute or suspicious osseous finding. Degenerative changes of the thoracic spine, mild to moderate in degree. Surgical clips again noted along the left chest wall. IMPRESSION: No active cardiopulmonary disease. No evidence of pneumonia or pulmonary edema. Aortic atherosclerosis. Electronically Signed   By: Franki Cabot M.D.   On: 03/13/2016 15:56   Dg Knee Complete 4  Views Left  Result Date: 03/13/2016 CLINICAL DATA:  Severe knee pain and swelling. EXAM: LEFT KNEE - COMPLETE 4+ VIEW COMPARISON:  06/27/2015 FINDINGS: Large joint effusion without visible fat component. No visible fracture or malalignment. Tricompartmental osteoarthritis with prominent spurring and generalized moderate joint narrowing. Chondrocalcinosis. Osteopenia and atherosclerosis. IMPRESSION: 1. Large joint effusion without acute osseous finding. 2. Moderate to severe osteoarthritis. 3. Chondrocalcinosis. Electronically Signed   By: Monte Fantasia M.D.   On: 03/13/2016 15:58   Dg Knee Complete 4 Views Right  Result Date: 03/13/2016 CLINICAL DATA:  Right knee swelling and pain. EXAM: RIGHT KNEE - COMPLETE 4+ VIEW COMPARISON:  06/27/2015 FINDINGS: Very large joint effusion distending the suprapatellar joint space. No underlying fracture or erosive changes noted. No malalignment. Chondrocalcinosis. Mild marginal spurring without joint narrowing. Osteopenia and atherosclerosis. IMPRESSION: 1. Large joint effusion without acute osseous finding. 2. Mild degenerative changes. 3. Chondrocalcinosis. Electronically Signed   By: Monte Fantasia M.D.   On: 03/13/2016 15:57    Procedures .Joint Aspiration/Arthrocentesis Date/Time: 03/13/2016 8:17 PM Performed by: Drenda Freeze Authorized by: Drenda Freeze   Consent:    Consent obtained:  Verbal   Consent given by:  Patient   Risks discussed:  Infection   Alternatives discussed:  No treatment Location:    Location:  Knee Anesthesia (see MAR for exact dosages):    Anesthesia method:  Local infiltration   Local anesthetic:  Lidocaine 2% w/o epi Procedure details:    Preparation: Patient was prepped and draped in usual sterile fashion     Needle gauge:  18 G   Ultrasound guidance: yes     Approach:  Medial   Aspirate amount:  70 cc   Aspirate characteristics:  Serous   Steroid injected: no     Specimen collected: yes   Post-procedure  details:    Dressing:  Adhesive bandage   Patient tolerance of procedure:  Tolerated well, no immediate complications    (including critical care time)  Medications Ordered in ED Medications  HYDROmorphone (DILAUDID) injection 1 mg (1 mg Intravenous Given 03/13/16 1437)  ondansetron (ZOFRAN) injection 4 mg (4 mg Intravenous Given 03/13/16 1437)  sodium chloride 0.9 % bolus 1,000 mL (0 mLs Intravenous Stopped 03/13/16 1543)  lidocaine (XYLOCAINE) 2 % (with pres) injection 200 mg (200 mg Other Given by Other 03/13/16 1658)  sodium chloride 0.9 % bolus 1,000 mL (0 mLs Intravenous Stopped 03/13/16 1917)  fentaNYL (SUBLIMAZE) injection 25 mcg (25 mcg Intravenous Given 03/13/16 1959)     Initial Impression / Assessment and Plan / ED Course  I have reviewed the triage vital signs and the nursing notes.  Pertinent labs & imaging results that were available during my care of the patient were reviewed by me and considered in my medical decision making (see chart for details).    Nancy [name] is a 76 y.o. female here with low grade temp, knee and back pain. She had knee injection several days ago. Consider worsening chronic pain vs sepsis from septic knee vs septic from other causes vs viral syndrome. Will get labs, ESR, CRP, knee xrays, UA, CXR.   5 pm Given dilaudid and briefly dest to 90% and put on 2 L . WBC 16, lactate 2.9. ESR 65. Xrays showed bilateral knee effusion. Able to range L knee but still can't range R knee and R knee slightly warm. I performed R knee arthrocentesis and sent to lab to r/o septic knee. CXR clear   8:18 PM Initial lactate 2.9, down to 2.2 now. Finally able to urinate after 2 L NS bolus. Patient still has significant back pain and still unable to ambulate. The arthrocentesis showed gout and WBC 19,000,consistent with gout flare. ESR 65. I don't know if her leukocytosis is from gout flare or if she is septic. She has significant neck and back pain. I talked to  hospitalist, who wants me to call ortho. Called Dr Lorin Mercy from [facility]. He recommend NSAID for possible gout flare and no abx for now. Still consider epidural abscess but she has no recent surgery or injections. Will admit for pain control, follow up cultures. If still has pain tomorrow, can order MRI of the spine for further evaluation. Hospitalist aware.    Final Clinical Impressions(s) / ED Diagnoses   Final diagnoses:  Neck pain  Back pain  Neck pain  Back pain    New Prescriptions New Prescriptions   No medications on file     Drenda Freeze, MD 03/13/16 2050

## 2016-03-13 NOTE — ED Notes (Signed)
Pt reporting chest pain that started recently. MD at bedside and aware. Pt being placed onto cardiac monitor at this time. Pt states pain in chest is worse with movement but is substernal.

## 2016-03-13 NOTE — ED Notes (Signed)
Pt given IV pain meds and desat to 86%. Pt was placed on 2L and O2 went up to 91%. Dr Darl Householder notified

## 2016-03-13 NOTE — ED Notes (Signed)
Pt given water per Dr. Yao. 

## 2016-03-13 NOTE — ED Notes (Signed)
Hospitalist at bedside to evaluate pt.

## 2016-03-13 NOTE — ED Notes (Signed)
Lactic Acid = 2.96, EDP Yao notified

## 2016-03-13 NOTE — H&P (Addendum)
History and Physical    Nancy [name] ZJQ:[id number] DOB: [birth year] DOA: 03/13/2016  Referring MD/NP/PA: Dr. Darl Householder PCP: Antony Blackbird, MD  Patient coming from: Home where patient lives with daughter  Chief Complaint: Knee pain  HPI: Nancy [name] is a 76 y.o. female with medical history significant of HTN, HLD, DM type II, gout, osteoarthritis, and chronic back pain; who presents with complaints of knee pain and swelling. Patient is followed by Dr. Marlou Sa of orthopedics for her osteoarthritis of her knees. Patient was noted to have significant swelling and was seen in the office 4 days ago, and underwent bilateral arthrocentesis with Hyaluronan injection. Patient notes this was her first "gel" injection in both her knees and previously had only had steroid injections in the past. Patient notes the following morning she woke up and noted recurrence of swelling in both knees worse on the right. Patient tried utilizing wraps and ice packs without relief of symptoms. Symptoms and it unbearable for her to ambulate. 2 days ago she called back to the office due pain symptoms and was prescribed tramadol. Patient's reports taking this medication without any significant relief of symptoms either. Thereafter, patient reports symptoms worsened with reports of soreness across her chest, upper part of her back, and neck. Feels as though she can't move without being in pain. Patient has actually been scheduled to have a dermatology appointment this coming week. Approximately 9 months ago patient underwent back surgery in subsequently had postoperative wound infection for which she requires 6 weeks of antibiotics. Other associated symptoms include low-grade fever of 100F, chills. Denies any nausea, vomiting, diarrhea, shortness of breath, or loss of consciousness.   ED Course: Upon admission into the emergency department patient seen to be afebrile, pulse 81-101, and all other vitals within normal limits. Lab  work revealed WBC 16.6, hemoglobin 10.9, platelets 526, sodium 132, chloride 99, glucose 196, ESR 65, and lactic acid 2.96. Synovial fluid revealed cloudy intracellular crystals and increased WBC of 19,657. Patient treated with colchicine, fentanyl, Dilaudid, Zofran, 2 L normal saline IV fluids.  Review of Systems: As per HPI otherwise 10 point review of systems negative.   Past Medical History:  Diagnosis Date  . Breast cyst    left breast  . Chest pain   . Complication of anesthesia 1986   slow to awaken after cholecystectomy  . Diastolic dysfunction    with mild pulmonary HTN by echo 07/2011  . Gastric ulcer   . GERD (gastroesophageal reflux disease)   . Gout   . Hyperlipidemia   . Hypertension   . Neuropathy ([employer])    from diabetes per patient  . Osteoarthritis   . Osteopenia   . Shortness of breath dyspnea    with activity  . Spine deformity    lower spine  . Type II diabetes mellitus ([place])     Past Surgical History:  Procedure Laterality Date  . ABDOMINAL ADHESION SURGERY    . ABDOMINAL HYSTERECTOMY    . ANUS SURGERY     for torn tissue  . APPENDECTOMY    . BREAST BIOPSY    . BREAST LUMPECTOMY     left  . CHOLECYSTECTOMY  1986  . COLONOSCOPY    . ESOPHAGOGASTRODUODENOSCOPY (EGD) WITH PROPOFOL N/A 07/18/2013   Procedure: ESOPHAGOGASTRODUODENOSCOPY (EGD) WITH PROPOFOL;  Surgeon: Arta Silence, MD;  Location: [facility];  Service: Endoscopy;  Laterality: N/A;  . EUS N/A 07/18/2013   Procedure: ESOPHAGEAL ENDOSCOPIC ULTRASOUND (EUS) RADIAL;  Surgeon: Arta Silence, MD;  Location: [facility];  Service: Endoscopy;  Laterality: N/A;  . IR GENERIC HISTORICAL  10/30/2015   IR US GUIDE VASC ACCESS RIGHT 10/30/2015 Saverio Danker, PA-C [facility]  . IR GENERIC HISTORICAL  10/30/2015   IR FLUORO GUIDE CV LINE RIGHT 10/30/2015 Saverio Danker, PA-C [facility]  . LUMBAR WOUND DEBRIDEMENT N/A 10/01/2015   Procedure: LUMBAR WOUND DEBRIDEMENT;  Surgeon: Melina Schools, MD;   Location: [place];  Service: Orthopedics;  Laterality: N/A;  . LUMBAR WOUND DEBRIDEMENT N/A 10/15/2015   Procedure: [facility] OUT AND CLOSURE OF BACK WOUND;  Surgeon: Melina Schools, MD;  Location: [place];  Service: Orthopedics;  Laterality: N/A;  . MASS EXCISION Left 07/24/2015   Procedure: EXCISION MASS LEFT LONG FINGER;  Surgeon: Leanora Cover, MD;  Location: [place];  Service: Orthopedics;  Laterality: Left;  . SPINAL FUSION N/A 09/17/2015   Procedure: FUSION POSTERIOR SPINAL MULTILEVEL L4- S1;  Surgeon: Melina Schools, MD;  Location: [place];  Service: Orthopedics;  Laterality: N/A;  . TRANSFORAMINAL LUMBAR INTERBODY FUSION (TLIF) WITH PEDICLE SCREW FIXATION 1 LEVEL N/A 09/17/2015   Procedure: TRANSFORAMINAL LUMBAR INTERBODY FUSION (TLIF) WITH PEDICLE SCREW FIXATION 1 LEVEL , Lumbar 4-5;  Surgeon: Melina Schools, MD;  Location: [place];  Service: Orthopedics;  Laterality: N/A;     reports that she has never smoked. She has never used smokeless tobacco. She reports that she does not drink alcohol or use drugs.  Allergies  Allergen Reactions  . Morphine And Related Swelling    10/02/15 - given Toradol + morphine and RN noted tongue swelling - given Epi + Benadryl Has had morphine in past w/o problem  . Toradol [Ketorolac Tromethamine] Swelling    10/02/15 - given Toradol + morphine and RN noted tongue swelling - given Epi + Benadryl Has taken ibuprofen in past w/o problem  . Codeine Other (See Comments)    headache    Family History  Problem Relation Age of Onset  . Heart disease Mother   . Cancer Sister     ovarian    Prior to Admission medications   Medication Sig Start Date End Date Taking? Authorizing Provider  allopurinol (ZYLOPRIM) 100 MG tablet Take 200 mg by mouth daily.    Yes Historical Provider, MD  amLODipine (NORVASC) 10 MG tablet Take 10 mg by mouth every morning.    Yes Historical Provider, MD  aspirin EC 81 MG tablet Take 81 mg by mouth daily.   Yes Historical  Provider, MD  colchicine (COLCRYS) 0.6 MG tablet Take 0.6 mg by mouth daily as needed (Gout).    Yes Historical Provider, MD  furosemide (LASIX) 20 MG tablet Take 1 tablet (20 mg total) by mouth daily. 01/02/16  Yes Sueanne Margarita, MD  GAVILYTE-N WITH FLAVOR PACK 420 g solution Take 1 Package by mouth as directed. 02/27/16  Yes Historical Provider, MD  glipiZIDE (GLUCOTROL) 5 MG tablet Take 5 mg by mouth daily before breakfast.   Yes Historical Provider, MD  losartan (COZAAR) 50 MG tablet Take 50 mg by mouth daily. 12/30/15  Yes Historical Provider, MD  metFORMIN (GLUCOPHAGE) 1000 MG tablet Take 1,000 mg by mouth 2 (two) times daily with a meal.     Yes Historical Provider, MD  metoprolol (LOPRESSOR) 100 MG tablet Take 100 mg by mouth every morning.    Yes Historical Provider, MD  ondansetron (ZOFRAN) 4 MG tablet Take 4 mg by mouth every 8 (eight) hours as needed for nausea or vomiting.  Yes Historical Provider, MD  Polyethyl Glycol-Propyl Glycol (SYSTANE OP) Apply 1 drop to eye as needed (Dry eyes).   Yes Historical Provider, MD  simvastatin (ZOCOR) 20 MG tablet Take 20 mg by mouth every evening.    Yes Historical Provider, MD  traMADol (ULTRAM) 50 MG tablet Take 1 tablet (50 mg total) by mouth every 6 (six) hours as needed. 03/11/16  Yes Scott Marcene Duos, MD  traZODone (DESYREL) 50 MG tablet Take 50-100 mg by mouth at bedtime as needed for sleep.    Yes Historical Provider, MD  ferrous sulfate 325 (65 FE) MG tablet Take 1 tablet (325 mg total) by mouth 3 (three) times daily with meals. Patient not taking: Reported on 03/13/2016 10/07/15   Darla Lesches Mayo, PA-C  insulin aspart (NOVOLOG) 100 UNIT/ML injection Inject 0-5 Units into the skin at bedtime. Patient not taking: Reported on 03/13/2016 10/07/15   [facility], PA-C  oxyCODONE-acetaminophen (PERCOCET/ROXICET) 5-325 MG tablet Take one tablet by mouth three times daily as needed for pain. Do not exceed 4gm of Tylenol in 24  hours Patient not taking: Reported on 03/13/2016 10/22/15   Estill Dooms, MD    Physical Exam:   Constitutional:elderly female who appears to be in moderate distress with even the slightest movement. Vitals:   03/13/16 1442 03/13/16 1557 03/13/16 1945 03/13/16 1949  BP:  135/71 143/70   Pulse: 88 81 81   Resp:  16 20   Temp:      TempSrc:      SpO2: 90% 95% 96%   Weight:    79.4 kg (175 lb)  Height:    5' 1.5" (1.562 m)   Eyes: PERRL, lids and conjunctivae normal ENMT: Mucous membranes are moist. Posterior pharynx clear of any exudate or lesions.  Neck: Diffuse tenderness to palpation of the neck and shoulder girdle. no masses, no thyromegaly Respiratory: clear to auscultation bilaterally, no wheezing, no crackles. Normal respiratory effort. No accessory muscle use.  Cardiovascular: Regular rate and rhythm, no murmurs / rubs / gallops. No extremity edema. 2+ pedal pulses. No carotid bruits.  Abdomen: no tenderness, no masses palpated. No hepatosplenomegaly. Bowel sounds positive.  Musculoskeletal: Bilateral knee effusions noted worse on the right knee. Tenderness to palpation of the bilateral knees noted, but no significant erythema or warmth appreciated. Decreased overall range of motion of the cervical thoracic spine secondary to pain. Skin: no rashes, lesions, ulcers. No induration Neurologic: CN 2-12 grossly intact. Sensation intact, DTR normal. Strength 5/5 in all 4.  Psychiatric: Normal judgment and insight. Alert and oriented x 3. Normal mood.     Labs on Admission: I have personally reviewed following labs and imaging studies  CBC:  Recent Labs Lab 03/13/16 1413  WBC 16.6*  NEUTROABS 13.6*  HGB 10.9*  HCT 31.0*  MCV 79.3  PLT 846*   Basic Metabolic Panel:  Recent Labs Lab 03/13/16 1620  NA 132*  K 3.8  CL 99*  CO2 23  GLUCOSE 196*  BUN 11  CREATININE 0.93  CALCIUM 9.1   GFR: Estimated Creatinine Clearance: 51.3 mL/min (by C-G formula based on SCr  of 0.93 mg/dL). Liver Function Tests:  Recent Labs Lab 03/13/16 1620  AST 26  ALT 13*  ALKPHOS 74  BILITOT 1.7*  PROT 8.3*  ALBUMIN 3.0*   No results for input(s): LIPASE, AMYLASE in the last 168 hours. No results for input(s): AMMONIA in the last 168 hours. Coagulation Profile: No results for input(s): INR, PROTIME in the  last 168 hours. Cardiac Enzymes: No results for input(s): CKTOTAL, CKMB, CKMBINDEX, TROPONINI in the last 168 hours. BNP (last 3 results) No results for input(s): PROBNP in the last 8760 hours. HbA1C: No results for input(s): HGBA1C in the last 72 hours. CBG: No results for input(s): GLUCAP in the last 168 hours. Lipid Profile: No results for input(s): CHOL, HDL, LDLCALC, TRIG, CHOLHDL, LDLDIRECT in the last 72 hours. Thyroid Function Tests: No results for input(s): TSH, T4TOTAL, FREET4, T3FREE, THYROIDAB in the last 72 hours. Anemia Panel: No results for input(s): VITAMINB12, FOLATE, FERRITIN, TIBC, IRON, RETICCTPCT in the last 72 hours. Urine analysis:    Component Value Date/Time   COLORURINE YELLOW 03/13/2016 1912   APPEARANCEUR HAZY (A) 03/13/2016 1912   LABSPEC 1.014 03/13/2016 1912   PHURINE 5.0 03/13/2016 1912   GLUCOSEU NEGATIVE 03/13/2016 1912   HGBUR NEGATIVE 03/13/2016 [place] NEGATIVE 03/13/2016 [place] NEGATIVE 03/13/2016 1912   PROTEINUR NEGATIVE 03/13/2016 1912   UROBILINOGEN 1.0 12/10/2006 0637   NITRITE NEGATIVE 03/13/2016 1912   LEUKOCYTESUR NEGATIVE 03/13/2016 1912   Sepsis Labs: No results found for this or any previous visit (from the past 240 hour(s)).   Radiological Exams on Admission: Dg Chest 2 View  Result Date: 03/13/2016 CLINICAL DATA:  Shortness of breath since yesterday. EXAM: CHEST  2 VIEW COMPARISON:  Chest x-ray dated 07/09/2011. FINDINGS: Study is slightly hypoinspiratory with crowding of the perihilar bronchovascular markings. Given the low lung volumes, lungs appear clear. No pleural  effusion or pneumothorax seen. Cardiomediastinal silhouette appears stable. Atherosclerotic changes again noted at the aortic arch. No acute or suspicious osseous finding. Degenerative changes of the thoracic spine, mild to moderate in degree. Surgical clips again noted along the left chest wall. IMPRESSION: No active cardiopulmonary disease. No evidence of pneumonia or pulmonary edema. Aortic atherosclerosis. Electronically Signed   By: Franki Cabot M.D.   On: 03/13/2016 15:56   Dg Knee Complete 4 Views Left  Result Date: 03/13/2016 CLINICAL DATA:  Severe knee pain and swelling. EXAM: LEFT KNEE - COMPLETE 4+ VIEW COMPARISON:  06/27/2015 FINDINGS: Large joint effusion without visible fat component. No visible fracture or malalignment. Tricompartmental osteoarthritis with prominent spurring and generalized moderate joint narrowing. Chondrocalcinosis. Osteopenia and atherosclerosis. IMPRESSION: 1. Large joint effusion without acute osseous finding. 2. Moderate to severe osteoarthritis. 3. Chondrocalcinosis. Electronically Signed   By: Monte Fantasia M.D.   On: 03/13/2016 15:58   Dg Knee Complete 4 Views Right  Result Date: 03/13/2016 CLINICAL DATA:  Right knee swelling and pain. EXAM: RIGHT KNEE - COMPLETE 4+ VIEW COMPARISON:  06/27/2015 FINDINGS: Very large joint effusion distending the suprapatellar joint space. No underlying fracture or erosive changes noted. No malalignment. Chondrocalcinosis. Mild marginal spurring without joint narrowing. Osteopenia and atherosclerosis. IMPRESSION: 1. Large joint effusion without acute osseous finding. 2. Mild degenerative changes. 3. Chondrocalcinosis. Electronically Signed   By: Monte Fantasia M.D.   On: 03/13/2016 15:57    EKG: Independently reviewed. Normal sinus rhythm  Assessment/Plan Bilateral knee effusions and pain/ Hx primary osteoarthritis: Acute. Patient recently had steroid injections the bilateral knees performed by Dr. Marlou Sa in outpatient setting.  Arthrocentesis performed and patient given orthopedics consulted recommending continuation of colchicine. Differential includes systemic injection reaction versus gout flare versus chondrocalcinosis versus less likely septic joint. - Admit to a MedSurg bed - Continue allopurinol and change colchicine to bid  - Pain control with oxycodone po/ Dilaudid IV prn moderate to severe pain respectively   - PT  eval and treat - orthopedics consulted, with follow-up recommendations in a.m.  SIRS/Leukocytosis/ lactic acidosis: WBC elevated at 16.6. With elevated lactic acid of 2.96. Question the possibility of infection, but orthopedics consulted and recommended against antibiotics at this time. - Follow-up studies - IVF NS at 75 ml/hr - Trend lactic acid levels - Will monitor for the possibility of underlying infection and place on empiric antibiotics if needed  Chest, neck, and back pain: Acute. Initial troponin negative. Suspect to be more likely musculoskeletal in nature.  - Trend cardiac troponins  Elevated ESR: Acute.  Patient presents with elevated ESR 65. Previously, ESR 135 back in 09/2015. With the associated shoulder pains question possibility of PMR. Patient already scheduled to follow-up with rheumatology as an outpatient. - May warrant initial workup as inpatient - Patient should keep outpatient appointment with rheumatology  History of Gout - check Uric acid - Pharmacy consulted and spoke with JC, PharmD noted that the patient's Lasix and aspirin are medication that may put patient at risk for flares, but notes that losartan beneficial in gout. - May benefit from dietary education  Anemia: Hemoglobin 10.9 on admission which appears to be here patient's baseline. - Continue to monitor.  Diabetes mellitus type 2: Initial glucose 196 on admission.  - Hypoglycemic protocols - Hold glipizide and Metformin - CBGs every before meals and at bedtime with sensitive SSI for now - Adjust regimen  as needed   Essential hypertension - Continue losartan, amlodipine, metoprolol  and furosemide  History of spinal stenosis s/p TLIF  Hyperlipidemia - Continue simvastatin   DVT prophylaxis: lovenox Code Status: Full Family Communication: The splenic ear with the patient and family present at bedside  Disposition Plan: TBD Consults called: Orthopedic  Admission status: Observation  Norval Morton MD [facility] Pager [phone]  If 7PM-7AM, please contact night-coverage [email or web address] Password [facility]  03/13/2016, 8:35 PM

## 2016-03-13 NOTE — ED Triage Notes (Signed)
Per EMS, pt complains of chronic back pain. Pt had back surgery 6 mo ago. Pt was taken off oxycodone yesterday and placed on tramadol yesterday. Pt running low grade fever, 99.9 orally. Pt given 1000mg  tylenol en route.   BP 138/90 HR 94 RR 18 Spo2 95 [id number]

## 2016-03-13 NOTE — ED Notes (Signed)
Bed: WA21 Expected date:  Expected time:  Means of arrival: Ambulance Comments: 76 yo back pain febrile

## 2016-03-14 DIAGNOSIS — M25462 Effusion, left knee: Secondary | ICD-10-CM

## 2016-03-14 DIAGNOSIS — R700 Elevated erythrocyte sedimentation rate: Secondary | ICD-10-CM | POA: Diagnosis present

## 2016-03-14 DIAGNOSIS — M25461 Effusion, right knee: Secondary | ICD-10-CM | POA: Diagnosis not present

## 2016-03-14 DIAGNOSIS — R6510 Systemic inflammatory response syndrome (SIRS) of non-infectious origin without acute organ dysfunction: Secondary | ICD-10-CM | POA: Diagnosis present

## 2016-03-14 DIAGNOSIS — I10 Essential (primary) hypertension: Secondary | ICD-10-CM | POA: Diagnosis not present

## 2016-03-14 LAB — GRAM STAIN

## 2016-03-14 LAB — URINALYSIS, ROUTINE W REFLEX MICROSCOPIC
Bilirubin Urine: NEGATIVE
GLUCOSE, UA: 150 mg/dL — AB
Hgb urine dipstick: NEGATIVE
KETONES UR: NEGATIVE mg/dL
Leukocytes, UA: NEGATIVE
NITRITE: NEGATIVE
PH: 6 (ref 5.0–8.0)
Protein, ur: NEGATIVE mg/dL
SPECIFIC GRAVITY, URINE: 1.006 (ref 1.005–1.030)

## 2016-03-14 LAB — BASIC METABOLIC PANEL
Anion gap: 12 (ref 5–15)
BUN: 11 mg/dL (ref 6–20)
CALCIUM: 9.1 mg/dL (ref 8.9–10.3)
CO2: 22 mmol/L (ref 22–32)
CREATININE: 0.72 mg/dL (ref 0.44–1.00)
Chloride: 98 mmol/L — ABNORMAL LOW (ref 101–111)
Glucose, Bld: 148 mg/dL — ABNORMAL HIGH (ref 65–99)
Potassium: 4.1 mmol/L (ref 3.5–5.1)
SODIUM: 132 mmol/L — AB (ref 135–145)

## 2016-03-14 LAB — GLUCOSE, CAPILLARY
GLUCOSE-CAPILLARY: 125 mg/dL — AB (ref 65–99)
GLUCOSE-CAPILLARY: 190 mg/dL — AB (ref 65–99)
Glucose-Capillary: 295 mg/dL — ABNORMAL HIGH (ref 65–99)
Glucose-Capillary: 352 mg/dL — ABNORMAL HIGH (ref 65–99)

## 2016-03-14 LAB — C-REACTIVE PROTEIN: CRP: 27.2 mg/dL — ABNORMAL HIGH (ref <1.0)

## 2016-03-14 LAB — CBC
HCT: 31.4 % — ABNORMAL LOW (ref 36.0–46.0)
Hemoglobin: 10.4 g/dL — ABNORMAL LOW (ref 12.0–15.0)
MCH: 26.6 pg (ref 26.0–34.0)
MCHC: 33.1 g/dL (ref 30.0–36.0)
MCV: 80.3 fL (ref 78.0–100.0)
PLATELETS: 268 10*3/uL (ref 150–400)
RBC: 3.91 MIL/uL (ref 3.87–5.11)
RDW: 17.4 % — AB (ref 11.5–15.5)
WBC: 13 10*3/uL — AB (ref 4.0–10.5)

## 2016-03-14 LAB — TROPONIN I

## 2016-03-14 LAB — LACTIC ACID, PLASMA: LACTIC ACID, VENOUS: 1.5 mmol/L (ref 0.5–1.9)

## 2016-03-14 LAB — MAGNESIUM: Magnesium: 1.4 mg/dL — ABNORMAL LOW (ref 1.7–2.4)

## 2016-03-14 LAB — MRSA PCR SCREENING: MRSA by PCR: INVALID — AB

## 2016-03-14 MED ORDER — SIMVASTATIN 10 MG PO TABS
20.0000 mg | ORAL_TABLET | Freq: Every evening | ORAL | Status: DC
  Administered 2016-03-14 – 2016-03-15 (×2): 20 mg via ORAL
  Filled 2016-03-14 (×2): qty 2

## 2016-03-14 MED ORDER — INSULIN ASPART 100 UNIT/ML ~~LOC~~ SOLN
3.0000 [IU] | Freq: Three times a day (TID) | SUBCUTANEOUS | Status: DC
  Administered 2016-03-15 – 2016-03-16 (×5): 3 [IU] via SUBCUTANEOUS

## 2016-03-14 MED ORDER — INSULIN ASPART 100 UNIT/ML ~~LOC~~ SOLN
0.0000 [IU] | Freq: Every day | SUBCUTANEOUS | Status: DC
  Administered 2016-03-14: 5 [IU] via SUBCUTANEOUS
  Administered 2016-03-15: 3 [IU] via SUBCUTANEOUS

## 2016-03-14 MED ORDER — INSULIN ASPART 100 UNIT/ML ~~LOC~~ SOLN
0.0000 [IU] | Freq: Three times a day (TID) | SUBCUTANEOUS | Status: DC
  Administered 2016-03-15: 7 [IU] via SUBCUTANEOUS
  Administered 2016-03-15: 15 [IU] via SUBCUTANEOUS
  Administered 2016-03-15 – 2016-03-16 (×3): 11 [IU] via SUBCUTANEOUS

## 2016-03-14 MED ORDER — METHYLPREDNISOLONE SODIUM SUCC 40 MG IJ SOLR
40.0000 mg | Freq: Two times a day (BID) | INTRAMUSCULAR | Status: AC
  Administered 2016-03-14 – 2016-03-16 (×4): 40 mg via INTRAVENOUS
  Filled 2016-03-14 (×4): qty 1

## 2016-03-14 NOTE — Evaluation (Signed)
Physical Therapy Evaluation Patient Details Name: Nancy [name] MRN: [id number] DOB: 08-29-1940 Today's Date: 03/14/2016   History of Present Illness   Nancy [name] is a 76 y.o. female with medical history significant of HTN, HLD, DM type II, gout, osteoarthritis, and chronic back pain (Sept/OCt 2017 wound vac in back); who presents with complaints of knee pain and swelling. Patient is followed by Dr. Marlou Sa of orthopedics for her osteoarthritis of her knees. Patient was noted to have significant swelling and was seen in the office 4 days ago, and underwent bilateral arthrocentesis with Hyaluronan injection. Patient notes this was her first "gel" injection in both her knees and previously had only had steroid injections in the past. Patient notes the following morning she woke up and noted recurrence of swelling in both knees worse on the right. Patient tried utilizing wraps and ice packs without relief of symptoms. Symptoms and it unbearable for her to ambulate.   Clinical Impression  Pt admitted with above diagnosis. Pt currently with functional limitations due to the deficits listed below (see PT Problem List). Pt will benefit from skilled PT to increase their independence and safety with mobility to allow discharge to the venue listed below.  Pt limited by pain and only able to sit up on EOB with MAX A of 2 required for bed mobility.  She was unable to WB through UE to assist with any attempts at scooting.  Daughter would like pt to be able to return home, but she will need to improve functionally in order for this to happen.  Will con't to assess during acute stay.     Follow Up Recommendations SNF;Home health PT;Supervision/Assistance - 24 hour (depends on acute progress)    Equipment Recommendations  None recommended by PT    Recommendations for Other Services OT consult     Precautions / Restrictions Precautions Precautions: Fall      Mobility  Bed Mobility Overal bed  mobility: Needs Assistance Bed Mobility: Supine to Sit;Sit to Supine     Supine to sit: Max assist;+2 for physical assistance Sit to supine: Max assist;+2 for physical assistance   General bed mobility comments: Pt attempting to help with LE, but required MAX of 2 with use of bed pad to get pt to EOB and back to supine. Unable to bear weight through UE to scoot to [facility] in sitting  Transfers                 General transfer comment: Unable to attempt due to pain   Ambulation/Gait                Stairs            Wheelchair Mobility    Modified Rankin (Stroke Patients Only)       Balance Overall balance assessment: Needs assistance Sitting-balance support: Feet supported;No upper extremity supported Sitting balance-Leahy Scale: Fair Sitting balance - Comments: Pt able to sit for 15 minutes with fair static balance. Unable to perform dynamic activities.  In sitting, educated on use of incentive spirometer and used x 5 reps                                     Pertinent Vitals/Pain Pain Assessment: 0-10 Pain Score: 10-Worst pain ever Pain Location: all extremities with L shoulder and R LE the worst Pain Descriptors / Indicators: Grimacing;Guarding;Moaning;Aching;Sore Pain Intervention(s): Limited activity within  patient's tolerance;Monitored during session;Premedicated before session;Repositioned    Home Living Family/patient expects to be discharged to:: Private residence Living Arrangements: Children Available Help at Discharge: Family;Available 24 hours/day Type of Home: House Home Access: Stairs to enter   [employer] of Steps: 1 Home Layout: Two level;Able to live on main level with bedroom/bathroom Home Equipment: Shower seat - built in;Walker - 2 wheels;Cane - single point;Bedside commode;Other (comment) (adjustable bed)      Prior Function Level of Independence: Independent with assistive device(s)          Comments: I with cane, but daughter feels she should have been using the RW      Hand Dominance        Extremity/Trunk Assessment   Upper Extremity Assessment Upper Extremity Assessment: RUE deficits/detail;LUE deficits/detail RUE: Unable to fully assess due to pain LUE: Unable to fully assess due to pain    Lower Extremity Assessment Lower Extremity Assessment: RLE deficits/detail;LLE deficits/detail RLE Deficits / Details: grossly 2+/5 RLE: Unable to fully assess due to pain LLE Deficits / Details: grossly 3-/5 LLE: Unable to fully assess due to pain       Communication      Cognition Arousal/Alertness: Awake/alert Behavior During Therapy: WFL for tasks assessed/performed Overall Cognitive Status: Within Functional Limits for tasks assessed                      General Comments General comments (skin integrity, edema, etc.): wraps on B knees    Exercises     Assessment/Plan    PT Assessment Patient needs continued PT services  PT Problem List Decreased strength;Decreased range of motion;Decreased activity tolerance;Decreased mobility;Decreased balance;Pain       PT Treatment Interventions DME instruction;Gait training;Functional mobility training;Therapeutic activities;Therapeutic exercise    PT Goals (Current goals can be found in the Care Plan section)  Acute Rehab PT Goals Patient Stated Goal: to figure out what is going on.  Daughter would like pt to be able to return home if possible PT Goal Formulation: With patient Time For Goal Achievement: 03/28/16 Potential to Achieve Goals: Fair    Frequency Min 3X/week   Barriers to discharge        Co-evaluation               End of Session   Activity Tolerance: Patient limited by pain Patient left: in bed;with call bell/phone within reach;with nursing/sitter in room;with family/visitor present Nurse Communication: Mobility status PT Visit Diagnosis: Pain;Muscle weakness (generalized)  (M62.81) Pain - Right/Left: Right Pain - part of body: Leg    Functional Assessment Tool Used: AM-PAC 6 Clicks Basic Mobility Functional Limitation: Changing and maintaining body position Changing and Maintaining Body Position Current Status [id number]): At least 80 percent but less than 100 percent impaired, limited or restricted Changing and Maintaining Body Position Goal Status [id number]): At least 20 percent but less than 40 percent impaired, limited or restricted    Time: [id number] PT Time Calculation (min) (ACUTE ONLY): 37 min   Charges:   PT Evaluation $PT Eval Moderate Complexity: 1 Procedure PT Treatments $Therapeutic Activity: 8-22 mins   PT G Codes:   PT G-Codes **NOT FOR INPATIENT CLASS** Functional Assessment Tool Used: AM-PAC 6 Clicks Basic Mobility Functional Limitation: Changing and maintaining body position Changing and Maintaining Body Position Current Status [id number]): At least 80 percent but less than 100 percent impaired, limited or restricted Changing and Maintaining Body Position Goal Status [id number]): At least 20 percent but less than  40 percent impaired, limited or restricted     Quinlynn Cuthbert LUBECK 03/14/2016, 2:02 PM

## 2016-03-14 NOTE — Progress Notes (Signed)
Patient reported chest pain/pressure. MD Olevia Bowens notified, Vital signs attained and documented, EKG complete as ordered. MD Olevia Bowens came to bedside to further assess. He advised not to give Nitroglycerin or Aspirin due to EKG WNL and that the chest pain occurs during activity not when patient is at rest. PRN Dilaudid given as ordered with some relief. Patient's daughter at bedside with call bell in reach. Will continue to monitor for changes.

## 2016-03-14 NOTE — Consult Note (Signed)
Reason for Consult: Pseudogout right knee with pre-existing osteoarthritis. Referring Physician: Maurene Capes MD  Nancy [name] is an 76 y.o. female.  HPI: 76 year old female with daughter at bedside with severe right greater than left knee pain. She has known osteoarthritis bilateral knee effusions and difficulty with ambulating. Aspiration showed cell count 19,000. Gram stain was negative. Aspirate showed intracellular calcium pyrophosphate crystals and she had recent Hyaluronan injections in each knee after previous intracellular cortisone injections in the past. Injection date was 03/10/2016. Patient's had increased pain increased swelling difficulty ambulating and transferring. Past history of instrumented lumbar fusion 1 level by Dr. Rolena Infante in September 2017 and has had difficulty resuming activity levels after that procedure. Total knee arthroplasty has been discussed with patient by Dr. Marlou Sa but they have been hesitant to proceed due to difficulty getting over her last lumbar procedure. Patient's been afebrile no chills or fever. Admission WBC was 16.6 thousand. ESR 65. Diabetes with insulin ordered. After lumbar procedure she had lumbar wound debridement. Past Medical History:  Diagnosis Date  . Allergy   . Breast cyst    left breast  . Chest pain   . Complication of anesthesia 1986   slow to awaken after cholecystectomy  . Diastolic dysfunction    with mild pulmonary HTN by echo 07/2011  . Gastric ulcer   . GERD (gastroesophageal reflux disease)   . Gout   . Hyperlipidemia   . Hypertension   . Neuropathy ([place])    from diabetes per patient  . Osteoarthritis   . Osteopenia   . Shortness of breath dyspnea    with activity  . Spine deformity    lower spine  . Type II diabetes mellitus ([place])     Past Surgical History:  Procedure Laterality Date  . ABDOMINAL ADHESION SURGERY    . ABDOMINAL HYSTERECTOMY    . ANUS SURGERY     for torn tissue  . APPENDECTOMY    . BREAST  BIOPSY    . BREAST LUMPECTOMY     left  . CHOLECYSTECTOMY  1986  . COLONOSCOPY    . ESOPHAGOGASTRODUODENOSCOPY (EGD) WITH PROPOFOL N/A 07/18/2013   Procedure: ESOPHAGOGASTRODUODENOSCOPY (EGD) WITH PROPOFOL;  Surgeon: Arta Silence, MD;  Location: [facility];  Service: Endoscopy;  Laterality: N/A;  . EUS N/A 07/18/2013   Procedure: ESOPHAGEAL ENDOSCOPIC ULTRASOUND (EUS) RADIAL;  Surgeon: Arta Silence, MD;  Location: [facility];  Service: Endoscopy;  Laterality: N/A;  . IR GENERIC HISTORICAL  10/30/2015   IR US GUIDE VASC ACCESS RIGHT 10/30/2015 Saverio Danker, PA-C [facility]  . IR GENERIC HISTORICAL  10/30/2015   IR FLUORO GUIDE CV LINE RIGHT 10/30/2015 Saverio Danker, PA-C [facility]  . LUMBAR WOUND DEBRIDEMENT N/A 10/01/2015   Procedure: LUMBAR WOUND DEBRIDEMENT;  Surgeon: Melina Schools, MD;  Location: [place];  Service: Orthopedics;  Laterality: N/A;  . LUMBAR WOUND DEBRIDEMENT N/A 10/15/2015   Procedure: [facility] OUT AND CLOSURE OF BACK WOUND;  Surgeon: Melina Schools, MD;  Location: [place];  Service: Orthopedics;  Laterality: N/A;  . MASS EXCISION Left 07/24/2015   Procedure: EXCISION MASS LEFT LONG FINGER;  Surgeon: Leanora Cover, MD;  Location: [place];  Service: Orthopedics;  Laterality: Left;  . SPINAL FUSION N/A 09/17/2015   Procedure: FUSION POSTERIOR SPINAL MULTILEVEL L4- S1;  Surgeon: Melina Schools, MD;  Location: [place];  Service: Orthopedics;  Laterality: N/A;  . TRANSFORAMINAL LUMBAR INTERBODY FUSION (TLIF) WITH PEDICLE SCREW FIXATION 1 LEVEL N/A 09/17/2015   Procedure:  TRANSFORAMINAL LUMBAR INTERBODY FUSION (TLIF) WITH PEDICLE SCREW FIXATION 1 LEVEL , Lumbar 4-5;  Surgeon: Dahari Brooks, MD;  Location: [facility];  Service: Orthopedics;  Laterality: N/A;    Family History  Problem Relation Age of Onset  . Heart disease Mother   . Cancer Sister     ovarian    Social History:  reports that she has never smoked. She has never used smokeless tobacco. She reports that  she does not drink alcohol or use drugs.  Allergies:  Allergies  Allergen Reactions  . Morphine And Related Swelling    10/02/15 - given Toradol + morphine and RN noted tongue swelling - given Epi + Benadryl Has had morphine in past w/o problem  . Toradol [Ketorolac Tromethamine] Swelling    10/02/15 - given Toradol + morphine and RN noted tongue swelling - given Epi + Benadryl Has taken ibuprofen in past w/o problem  . Codeine Other (See Comments)    headache    Medications: I have reviewed the patient's current medications.  Results for orders placed or performed during the hospital encounter of 03/13/16 (from the past 48 hour(s))  CBC with Differential/Platelet     Status: Abnormal   Collection Time: 03/13/16  2:13 PM  Result Value Ref Range   WBC 16.6 (H) 4.0 - 10.5 K/uL   RBC 3.91 3.87 - 5.11 MIL/uL   Hemoglobin 10.9 (L) 12.0 - 15.0 g/dL   HCT 31.0 (L) 36.0 - 46.0 %   MCV 79.3 78.0 - 100.0 fL   MCH 27.9 26.0 - 34.0 pg   MCHC 35.2 30.0 - 36.0 g/dL   RDW 19.9 (H) 11.5 - 15.5 %   Platelets 526 (H) 150 - 400 K/uL    Comment: RESULT REPEATED AND VERIFIED SPECIMEN CHECKED FOR CLOTS PLATELET COUNT CONFIRMED BY SMEAR    Neutrophils Relative % 82 %   Lymphocytes Relative 9 %   Monocytes Relative 9 %   Eosinophils Relative 0 %   Basophils Relative 0 %   Neutro Abs 13.6 (H) 1.7 - 7.7 K/uL   Lymphs Abs 1.5 0.7 - 4.0 K/uL   Monocytes Absolute 1.5 (H) 0.1 - 1.0 K/uL   Eosinophils Absolute 0.0 0.0 - 0.7 K/uL   Basophils Absolute 0.0 0.0 - 0.1 K/uL   Smear Review MORPHOLOGY UNREMARKABLE   Blood culture (routine x 2)     Status: None (Preliminary result)   Collection Time: 03/13/16  2:13 PM  Result Value Ref Range   Specimen Description BLOOD RIGHT HAND    Special Requests BOTTLES DRAWN AEROBIC AND ANAEROBIC 5 C EACH    Culture      NO GROWTH < 24 HOURS Performed at [facility], [address] [place], [place] [address]    Report Status PENDING   Sedimentation rate      Status: Abnormal   Collection Time: 03/13/16  2:13 PM  Result Value Ref Range   Sed Rate 65 (H) 0 - 22 mm/hr  I-Stat CG4 Lactic Acid, ED     Status: Abnormal   Collection Time: 03/13/16  2:25 PM  Result Value Ref Range   Lactic Acid, Venous 2.96 (HH) 0.5 - 1.9 mmol/L   Comment NOTIFIED PHYSICIAN   Blood culture (routine x 2)     Status: None (Preliminary result)   Collection Time: 03/13/16  2:38 PM  Result Value Ref Range   Specimen Description BLOOD RIGHT HAND    Special Requests BOTTLES DRAWN AEROBIC AND ANAEROBIC 10 CC      Culture      NO GROWTH < 24 HOURS Performed at [facility], [place] [address], [place], [place] [address]    Report Status PENDING   Comprehensive metabolic panel     Status: Abnormal   Collection Time: 03/13/16  4:20 PM  Result Value Ref Range   Sodium 132 (L) 135 - 145 mmol/L   Potassium 3.8 3.5 - 5.1 mmol/L   Chloride 99 (L) 101 - 111 mmol/L   CO2 23 22 - 32 mmol/L   Glucose, Bld 196 (H) 65 - 99 mg/dL   BUN 11 6 - 20 mg/dL   Creatinine, Ser 0.93 0.44 - 1.00 mg/dL   Calcium 9.1 8.9 - 10.3 mg/dL   Total Protein 8.3 (H) 6.5 - 8.1 g/dL   Albumin 3.0 (L) 3.5 - 5.0 g/dL   AST 26 15 - 41 U/L   ALT 13 (L) 14 - 54 U/L   Alkaline Phosphatase 74 38 - 126 U/L   Total Bilirubin 1.7 (H) 0.3 - 1.2 mg/dL   GFR calc non Af Amer 59 (L) >60 mL/min   GFR calc Af Amer >60 >60 mL/min    Comment: (NOTE) The eGFR has been calculated using the CKD EPI equation. This calculation has not been validated in all clinical situations. eGFR's persistently <60 mL/min signify possible Chronic Kidney Disease.    Anion gap 10 5 - 15  Synovial cell count + diff, w/ crystals     Status: Abnormal   Collection Time: 03/13/16  4:34 PM  Result Value Ref Range   Color, Synovial YELLOW YELLOW   Appearance-Synovial CLOUDY (A) CLEAR   Crystals, Fluid INTRACELLULAR CALCIUM PYROPHOSPHATE CRYSTALS    WBC, Synovial 19,657 (H) 0 - 200 /cu mm   Neutrophil, Synovial 97 (H) 0 - 25 %    Lymphocytes-Synovial Fld 3 0 - 20 %   Monocyte-Macrophage-Synovial Fluid 0 (L) 50 - 90 %   Eosinophils-Synovial 0 0 - 1 %   Other Cells-SYN NONE   Glucose, plural or peritoneal fluid     Status: None   Collection Time: 03/13/16  5:00 PM  Result Value Ref Range   Glucose, Fluid 155 mg/dL    Comment: (NOTE) No normal range established for this test Results should be evaluated in conjunction with serum values Performed at [facility], [address], [place], [place] [address]    Fluid Type-FGLU SYNOVIAL   Protein, pleural or peritoneal fluid     Status: None   Collection Time: 03/13/16  5:00 PM  Result Value Ref Range   Total protein, fluid 5.0 g/dL    Comment: (NOTE) No normal range established for this test Results should be evaluated in conjunction with serum values Performed at [facility] [address] [place], [place] [address]    Fluid Type-FTP SYNOVIAL   I-Stat CG4 Lactic Acid, ED     Status: Abnormal   Collection Time: 03/13/16  5:46 PM  Result Value Ref Range   Lactic Acid, Venous 2.20 (HH) 0.5 - 1.9 mmol/L   Comment NOTIFIED PHYSICIAN   Culture, body fluid-bottle     Status: None (Preliminary result)   Collection Time: 03/13/16  6:34 PM  Result Value Ref Range   Specimen Description FLUID    Special Requests NONE    Culture      NO GROWTH < 24 HOURS Performed at [facility], [place] [address] [place], [place] [address]    Report Status PENDING  Gram stain     Status: None   Collection Time: 03/13/16  6:34 PM  Result Value Ref Range   Specimen Description FLUID    Special Requests NONE    Gram Stain      ABUNDANT WBC PRESENT, PREDOMINANTLY PMN NO ORGANISMS SEEN Performed at [facility], [address] [place], [place] [address]    Report Status 03/14/2016 FINAL   Urine culture     Status: None (Preliminary result)   Collection Time: 03/13/16  7:12 PM  Result Value Ref Range   Specimen Description URINE, RANDOM    Special  Requests NONE    Culture PENDING    Report Status PENDING   Urinalysis, Routine w reflex microscopic     Status: Abnormal   Collection Time: 03/13/16  7:12 PM  Result Value Ref Range   Color, Urine YELLOW YELLOW   APPearance HAZY (A) CLEAR   Specific Gravity, Urine 1.014 1.005 - 1.030   pH 5.0 5.0 - 8.0   Glucose, UA NEGATIVE NEGATIVE mg/dL   Hgb urine dipstick NEGATIVE NEGATIVE   Bilirubin Urine NEGATIVE NEGATIVE   Ketones, ur NEGATIVE NEGATIVE mg/dL   Protein, ur NEGATIVE NEGATIVE mg/dL   Nitrite NEGATIVE NEGATIVE   Leukocytes, UA NEGATIVE NEGATIVE  I-stat troponin, ED     Status: None   Collection Time: 03/13/16  8:14 PM  Result Value Ref Range   Troponin i, poc 0.01 0.00 - 0.08 ng/mL   Comment 3            Comment: Due to the release kinetics of cTnI, a negative result within the first hours of the onset of symptoms does not rule out myocardial infarction with certainty. If myocardial infarction is still suspected, repeat the test at appropriate intervals.   C-reactive protein     Status: Abnormal   Collection Time: 03/13/16  8:40 PM  Result Value Ref Range   CRP 27.2 (H) <1.0 mg/dL    Comment: Performed at [facility], [address] [place], [place] [address]  CBG monitoring, ED     Status: Abnormal   Collection Time: 03/13/16 10:01 PM  Result Value Ref Range   Glucose-Capillary 164 (H) 65 - 99 mg/dL  Troponin I     Status: None   Collection Time: 03/13/16 10:30 PM  Result Value Ref Range   Troponin I <0.03 <0.03 ng/mL  Uric acid     Status: None   Collection Time: 03/13/16 10:30 PM  Result Value Ref Range   Uric Acid, Serum 3.6 2.3 - 6.6 mg/dL  Lactic acid, plasma     Status: Abnormal   Collection Time: 03/13/16 11:06 PM  Result Value Ref Range   Lactic Acid, Venous 2.0 (HH) 0.5 - 1.9 mmol/L    Comment: CRITICAL RESULT CALLED TO, READ BACK BY AND VERIFIED WITH: WEAVER,T RN [id number] [place],N   MRSA PCR Screening     Status: Abnormal    Collection Time: 03/14/16  2:21 AM  Result Value Ref Range   MRSA by PCR INVALID RESULTS, SPECIMEN SENT FOR CULTURE (A) NEGATIVE    Comment: T WEAVER AT 0700 ON 03.04.2018 BY NBROOKS        The GeneXpert MRSA Assay (FDA approved for NASAL specimens only), is one component of a comprehensive MRSA colonization surveillance program. It is not intended to diagnose MRSA infection nor to guide or monitor treatment for MRSA infections.   CBC     Status: Abnormal     Collection Time: 03/14/16  2:24 AM  Result Value Ref Range   WBC 13.0 (H) 4.0 - 10.5 K/uL   RBC 3.91 3.87 - 5.11 MIL/uL   Hemoglobin 10.4 (L) 12.0 - 15.0 g/dL   HCT 31.4 (L) 36.0 - 46.0 %   MCV 80.3 78.0 - 100.0 fL   MCH 26.6 26.0 - 34.0 pg   MCHC 33.1 30.0 - 36.0 g/dL   RDW 17.4 (H) 11.5 - 15.5 %   Platelets 268 150 - 400 K/uL  Basic metabolic panel     Status: Abnormal   Collection Time: 03/14/16  2:24 AM  Result Value Ref Range   Sodium 132 (L) 135 - 145 mmol/L   Potassium 4.1 3.5 - 5.1 mmol/L   Chloride 98 (L) 101 - 111 mmol/L   CO2 22 22 - 32 mmol/L   Glucose, Bld 148 (H) 65 - 99 mg/dL   BUN 11 6 - 20 mg/dL   Creatinine, Ser 0.72 0.44 - 1.00 mg/dL   Calcium 9.1 8.9 - 10.3 mg/dL   GFR calc non Af Amer >60 >60 mL/min   GFR calc Af Amer >60 >60 mL/min    Comment: (NOTE) The eGFR has been calculated using the CKD EPI equation. This calculation has not been validated in all clinical situations. eGFR's persistently <60 mL/min signify possible Chronic Kidney Disease.    Anion gap 12 5 - 15  Troponin I     Status: None   Collection Time: 03/14/16  2:24 AM  Result Value Ref Range   Troponin I <0.03 <0.03 ng/mL  Lactic acid, plasma     Status: None   Collection Time: 03/14/16  2:24 AM  Result Value Ref Range   Lactic Acid, Venous 1.5 0.5 - 1.9 mmol/L  Magnesium     Status: Abnormal   Collection Time: 03/14/16  2:24 AM  Result Value Ref Range   Magnesium 1.4 (L) 1.7 - 2.4 mg/dL  Glucose, capillary     Status:  Abnormal   Collection Time: 03/14/16  7:53 AM  Result Value Ref Range   Glucose-Capillary 125 (H) 65 - 99 mg/dL   Comment 1 Notify RN    Comment 2 Document in Chart   Glucose, capillary     Status: Abnormal   Collection Time: 03/14/16 12:06 PM  Result Value Ref Range   Glucose-Capillary 190 (H) 65 - 99 mg/dL   Comment 1 Notify RN    Comment 2 Document in Chart   Urinalysis, Routine w reflex microscopic     Status: Abnormal   Collection Time: 03/14/16 12:26 PM  Result Value Ref Range   Color, Urine YELLOW YELLOW   APPearance CLEAR CLEAR   Specific Gravity, Urine 1.006 1.005 - 1.030   pH 6.0 5.0 - 8.0   Glucose, UA 150 (A) NEGATIVE mg/dL   Hgb urine dipstick NEGATIVE NEGATIVE   Bilirubin Urine NEGATIVE NEGATIVE   Ketones, ur NEGATIVE NEGATIVE mg/dL   Protein, ur NEGATIVE NEGATIVE mg/dL   Nitrite NEGATIVE NEGATIVE   Leukocytes, UA NEGATIVE NEGATIVE  Glucose, capillary     Status: Abnormal   Collection Time: 03/14/16  5:13 PM  Result Value Ref Range   Glucose-Capillary 295 (H) 65 - 99 mg/dL   Comment 1 Notify RN    Comment 2 Document in Chart     Dg Chest 2 View  Result Date: 03/13/2016 CLINICAL DATA:  Shortness of breath since yesterday. EXAM: CHEST  2 VIEW COMPARISON:  Chest x-ray dated 07/09/2011. FINDINGS: Study   is slightly hypoinspiratory with crowding of the perihilar bronchovascular markings. Given the low lung volumes, lungs appear clear. No pleural effusion or pneumothorax seen. Cardiomediastinal silhouette appears stable. Atherosclerotic changes again noted at the aortic arch. No acute or suspicious osseous finding. Degenerative changes of the thoracic spine, mild to moderate in degree. Surgical clips again noted along the left chest wall. IMPRESSION: No active cardiopulmonary disease. No evidence of pneumonia or pulmonary edema. Aortic atherosclerosis. Electronically Signed   By: Franki Cabot M.D.   On: 03/13/2016 15:56   Dg Knee Complete 4 Views Left  Result Date:  03/13/2016 CLINICAL DATA:  Severe knee pain and swelling. EXAM: LEFT KNEE - COMPLETE 4+ VIEW COMPARISON:  06/27/2015 FINDINGS: Large joint effusion without visible fat component. No visible fracture or malalignment. Tricompartmental osteoarthritis with prominent spurring and generalized moderate joint narrowing. Chondrocalcinosis. Osteopenia and atherosclerosis. IMPRESSION: 1. Large joint effusion without acute osseous finding. 2. Moderate to severe osteoarthritis. 3. Chondrocalcinosis. Electronically Signed   By: Monte Fantasia M.D.   On: 03/13/2016 15:58   Dg Knee Complete 4 Views Right  Result Date: 03/13/2016 CLINICAL DATA:  Right knee swelling and pain. EXAM: RIGHT KNEE - COMPLETE 4+ VIEW COMPARISON:  06/27/2015 FINDINGS: Very large joint effusion distending the suprapatellar joint space. No underlying fracture or erosive changes noted. No malalignment. Chondrocalcinosis. Mild marginal spurring without joint narrowing. Osteopenia and atherosclerosis. IMPRESSION: 1. Large joint effusion without acute osseous finding. 2. Mild degenerative changes. 3. Chondrocalcinosis. Electronically Signed   By: Monte Fantasia M.D.   On: 03/13/2016 15:57    ROS 14 point review of systems updated and is negative other than as mentioned above with mild pulmonary hypertension, GERD, hyperlipidemia, hypertension, diabetic neuropathy, lumbar spine fusion, type 2 diabetes, previous cholecystectomy. Blood pressure (!) 148/75, pulse 93, temperature 98.1 F (36.7 C), temperature source Oral, resp. rate 16, height 5' 1.5" (1.562 m), weight 180 lb 12.4 oz (82 kg), SpO2 100 %. Physical Exam  General patient's alert oriented in bed barely mobile complaining of pain with knee range of motion.  Eyes PERRLA extra ocular movements intact.  HEENT no head trauma good cervical range of motion.  Inspiratory audible wheezing no accessory muscle usage.  Cardiovascular regular rate and rhythm without murmur.  Abdomen nontender no  palpable masses.  Musculoskeletal no pain with hip range of motion. Patient has bilateral knee effusion worse on the right than left wrapped with Ace wraps. Ace wrap is removed there is synovitis with knee tenderness. Collateral ligaments are stable. No distal edema. Distal pulses are 2+.  Psychiatric patient's alert oriented responsive pleasant cooperative.  Assessment/Plan: Pseudogout with pre-existing osteoarthritis. Treatment for pseudogout. If steroids are used insulin sliding scale to handle potential hyperglycemia. If she is not better in a week then intra-articular cortisone injections could be considered. With discuss supplements patients occasionally have synovitis within a few days after the injection which usually resolves however in this case it appears to be [name] related to pseudogout. Discussed outlined treatment plan with Dr.Emokpae is at the bedside with me as well as patient's daughter. Outlined treatment plan discussed. All of their questions my phone [phone]. I'll be gone starting tomorrow Dr. Marlou Sa will be available of their questions. His cell [phone]. Thank you for the opportunity see her in consultation.  Marybelle Killings 03/14/2016, 9:12 PM

## 2016-03-14 NOTE — Progress Notes (Signed)
Patient Demographics:    Nancy [name], is a 76 y.o. female, DOB - [birth year], BHA:[id number]  Admit date - 03/13/2016   Admitting Physician Norval Morton, MD  Outpatient Primary MD for the patient is FULP, CAMMIE, MD  LOS - 0   Chief Complaint  Patient presents with  . Back Pain        Subjective:    [name] today has no fevers, no emesis,  No chest pain,  C/o Worsening bilateral knee pain right more than left , daughter Marliss Coots at Bedside  Assessment  & Plan :    Active Problems:   Essential hypertension, benign   Gout   Type 2 diabetes mellitus (HCC)   Uncontrolled type 2 diabetes mellitus with complication (HCC)   Bilateral knee effusions   SIRS (systemic inflammatory response syndrome) (HCC)   Elevated sed rate   1)Pseudogout-  Intracellular Calcium pyrophosphate crystals noted on fluid aspirate, c/n colchicine, d/w Dr Lorin Mercy (ortho), will treat empirically with IV Solu-Medrol 40 mg every 12 hours  2)Knee Pain- suspect combination of osteoarthritis and pseudogout , cell count 19,000, gram stain w/o organism, septic arthritis less likley, d/w Dr Lorin Mercy, advised to hold off on Abx for now. Continue physical therapy. ESR is elevated. Patient and daughter declines consideration of total knee replacement in the near future. Patient has outpatient rheumatology appointment scheduled for next week  3)Episode of CP- resolved, troponin negative  4)SIRS- patient with leukocytosis and elevated lactic acid, knee aspirate Does not look infectious, continue IV fluids , cultures are pending  5)Anemia: Hemoglobin 10.9 on admission which appears to be here patient's baseline. - Continue to monitor.  6)Diabetes mellitus type 2-  anticipate worsening glycemic control due to steroid use, Use Novolog/Humalog Sliding scale insulin with Accu-Cheks/Fingersticks as ordered   7)Essential  hypertension-  Continue losartan, amlodipine, metoprolol  and furosemide  8)History of spinal stenosis s/p TLIF-continue physical therapy  9)Hyperlipidemia- Continue statin   DVT prophylaxis: lovenox Code Status: Full Family Communication:  Disposition Plan: TBD Consults called: Orthopedic  Admission status: Observation  Lab Results  Component Value Date   PLT 268 03/14/2016    Inpatient Medications  Scheduled Meds: . allopurinol  200 mg Oral Daily  . amLODipine  10 mg Oral q morning - 10a  . aspirin  243 mg Oral Once  . aspirin EC  81 mg Oral Daily  . colchicine  0.6 mg Oral BID  . enoxaparin (LOVENOX) injection  40 mg Subcutaneous QHS  . furosemide  20 mg Oral Daily  . insulin aspart  0-5 Units Subcutaneous QHS  . insulin aspart  0-9 Units Subcutaneous TID WC  . losartan  50 mg Oral Daily  . metoprolol  100 mg Oral q morning - 10a  . simvastatin  20 mg Oral QPM   Continuous Infusions: PRN Meds:.acetaminophen **OR** acetaminophen, albuterol, HYDROmorphone (DILAUDID) injection, nitroGLYCERIN, ondansetron **OR** ondansetron (ZOFRAN) IV, oxyCODONE, polyvinyl alcohol    Anti-infectives    None        Objective:   Vitals:   03/13/16 2130 03/13/16 2300 03/13/16 2320 03/14/16 0602  BP: 142/71 (!) 151/73 (!) 172/78 (!) 156/79  Pulse: 82 83 90 (!) 101  Resp: _0 Temp:  98.9 F (  37.2 C)  99.7 F (37.6 C)  TempSrc:  Oral  Oral  SpO2: 97% 99% 99% 98%  Weight:  82 kg (180 lb 12.4 oz)    Height:  5' 1.5" (1.562 m)      Wt Readings from Last 3 Encounters:  03/13/16 82 kg (180 lb 12.4 oz)  12/18/15 79.4 kg (175 lb)  10/29/15 80.3 kg (177 lb)     Intake/Output Summary (Last 24 hours) at 03/14/16 0812 Last data filed at 03/14/16 0309  Gross per 24 hour  Intake              255 ml  Output                0 ml  Net              255 ml     Physical Exam  Gen:- Awake Alert,  obese HEENT:- [place].AT, No sclera icterus Neck-Supple Neck,No JVD,.  Lungs-   CTAB  CV- S1, S2 normal Abd-  +ve B.Sounds, Abd Soft, No tenderness,    Extremity/Skin:- Bil Knee Swelling and tenderness, reduced ROM/stiffness    Data Review:   Micro Results Recent Results (from the past 240 hour(s))  Gram stain     Status: None   Collection Time: 03/13/16  6:34 PM  Result Value Ref Range Status   Specimen Description FLUID  Final   Special Requests NONE  Final   Gram Stain   Final    ABUNDANT WBC PRESENT, PREDOMINANTLY PMN NO ORGANISMS SEEN Performed at [facility], [place] [address], [place], [place] [address]    Report Status 03/14/2016 FINAL  Final  Urine culture     Status: None (Preliminary result)   Collection Time: 03/13/16  7:12 PM  Result Value Ref Range Status   Specimen Description URINE, RANDOM  Final   Special Requests NONE  Final   Culture PENDING  Incomplete   Report Status PENDING  Incomplete  MRSA PCR Screening     Status: Abnormal   Collection Time: 03/14/16  2:21 AM  Result Value Ref Range Status   MRSA by PCR INVALID RESULTS, SPECIMEN SENT FOR CULTURE (A) NEGATIVE Final    Comment: T WEAVER AT 0700 ON 03.04.2018 BY NBROOKS        The GeneXpert MRSA Assay (FDA approved for NASAL specimens only), is one component of a comprehensive MRSA colonization surveillance program. It is not intended to diagnose MRSA infection nor to guide or monitor treatment for MRSA infections.     Radiology Reports Dg Chest 2 View  Result Date: 03/13/2016 CLINICAL DATA:  Shortness of breath since yesterday. EXAM: CHEST  2 VIEW COMPARISON:  Chest x-ray dated 07/09/2011. FINDINGS: Study is slightly hypoinspiratory with crowding of the perihilar bronchovascular markings. Given the low lung volumes, lungs appear clear. No pleural effusion or pneumothorax seen. Cardiomediastinal silhouette appears stable. Atherosclerotic changes again noted at the aortic arch. No acute or suspicious osseous finding. Degenerative changes of the thoracic spine, mild to  moderate in degree. Surgical clips again noted along the left chest wall. IMPRESSION: No active cardiopulmonary disease. No evidence of pneumonia or pulmonary edema. Aortic atherosclerosis. Electronically Signed   By: Franki Cabot M.D.   On: 03/13/2016 15:56   Dg Knee Complete 4 Views Left  Result Date: 03/13/2016 CLINICAL DATA:  Severe knee pain and swelling. EXAM: LEFT KNEE - COMPLETE 4+ VIEW COMPARISON:  06/27/2015 FINDINGS: Large joint effusion without visible fat component. No visible fracture  or malalignment. Tricompartmental osteoarthritis with prominent spurring and generalized moderate joint narrowing. Chondrocalcinosis. Osteopenia and atherosclerosis. IMPRESSION: 1. Large joint effusion without acute osseous finding. 2. Moderate to severe osteoarthritis. 3. Chondrocalcinosis. Electronically Signed   By: Monte Fantasia M.D.   On: 03/13/2016 15:58   Dg Knee Complete 4 Views Right  Result Date: 03/13/2016 CLINICAL DATA:  Right knee swelling and pain. EXAM: RIGHT KNEE - COMPLETE 4+ VIEW COMPARISON:  06/27/2015 FINDINGS: Very large joint effusion distending the suprapatellar joint space. No underlying fracture or erosive changes noted. No malalignment. Chondrocalcinosis. Mild marginal spurring without joint narrowing. Osteopenia and atherosclerosis. IMPRESSION: 1. Large joint effusion without acute osseous finding. 2. Mild degenerative changes. 3. Chondrocalcinosis. Electronically Signed   By: Monte Fantasia M.D.   On: 03/13/2016 15:57     CBC  Recent Labs Lab 03/13/16 1413 03/14/16 0224  WBC 16.6* 13.0*  HGB 10.9* 10.4*  HCT 31.0* 31.4*  PLT 526* 268  MCV 79.3 80.3  MCH 27.9 26.6  MCHC 35.2 33.1  RDW 19.9* 17.4*  LYMPHSABS 1.5  --   MONOABS 1.5*  --   EOSABS 0.0  --   BASOSABS 0.0  --     Chemistries   Recent Labs Lab 03/13/16 1620 03/14/16 0224  NA 132* 132*  K 3.8 4.1  CL 99* 98*  CO2 23 22  GLUCOSE 196* 148*  BUN 11 11  CREATININE 0.93 0.72  CALCIUM 9.1 9.1  MG   --  1.4*  AST 26  --   ALT 13*  --   ALKPHOS 74  --   BILITOT 1.7*  --    ------------------------------------------------------------------------------------------------------------------ No results for input(s): CHOL, HDL, LDLCALC, TRIG, CHOLHDL, LDLDIRECT in the last 72 hours.  Lab Results  Component Value Date   HGBA1C 7.3 (H) 10/01/2015   ------------------------------------------------------------------------------------------------------------------ No results for input(s): TSH, T4TOTAL, T3FREE, THYROIDAB in the last 72 hours.  Invalid input(s): FREET3 ------------------------------------------------------------------------------------------------------------------ No results for input(s): VITAMINB12, FOLATE, FERRITIN, TIBC, IRON, RETICCTPCT in the last 72 hours.  Coagulation profile No results for input(s): INR, PROTIME in the last 168 hours.  No results for input(s): DDIMER in the last 72 hours.  Cardiac Enzymes  Recent Labs Lab 03/13/16 2230 03/14/16 0224  TROPONINI <0.03 <0.03   ------------------------------------------------------------------------------------------------------------------ No results found for: BNP   Unknown Schleyer M.D on 03/14/2016 at 8:12 AM  Between 7am to 7pm - Pager - [phone]  After 7pm go to [email or web address] - password [email or web address]  [facility] -  Office  [phone]  Dragon dictation system was used to create this note, attempts have been made to correct errors, however presence of uncorrected errors is not a reflection quality of care provided

## 2016-03-15 DIAGNOSIS — Z885 Allergy status to narcotic agent status: Secondary | ICD-10-CM | POA: Diagnosis not present

## 2016-03-15 DIAGNOSIS — Z90710 Acquired absence of both cervix and uterus: Secondary | ICD-10-CM | POA: Diagnosis not present

## 2016-03-15 DIAGNOSIS — Z886 Allergy status to analgesic agent status: Secondary | ICD-10-CM | POA: Diagnosis not present

## 2016-03-15 DIAGNOSIS — I10 Essential (primary) hypertension: Secondary | ICD-10-CM | POA: Diagnosis present

## 2016-03-15 DIAGNOSIS — Z7984 Long term (current) use of oral hypoglycemic drugs: Secondary | ICD-10-CM | POA: Diagnosis not present

## 2016-03-15 DIAGNOSIS — E1165 Type 2 diabetes mellitus with hyperglycemia: Secondary | ICD-10-CM | POA: Diagnosis present

## 2016-03-15 DIAGNOSIS — I272 Pulmonary hypertension, unspecified: Secondary | ICD-10-CM | POA: Diagnosis present

## 2016-03-15 DIAGNOSIS — M25462 Effusion, left knee: Secondary | ICD-10-CM | POA: Diagnosis not present

## 2016-03-15 DIAGNOSIS — M542 Cervicalgia: Secondary | ICD-10-CM | POA: Diagnosis present

## 2016-03-15 DIAGNOSIS — Z981 Arthrodesis status: Secondary | ICD-10-CM | POA: Diagnosis not present

## 2016-03-15 DIAGNOSIS — D649 Anemia, unspecified: Secondary | ICD-10-CM | POA: Diagnosis present

## 2016-03-15 DIAGNOSIS — G8929 Other chronic pain: Secondary | ICD-10-CM | POA: Diagnosis present

## 2016-03-15 DIAGNOSIS — M25461 Effusion, right knee: Secondary | ICD-10-CM | POA: Diagnosis not present

## 2016-03-15 DIAGNOSIS — E785 Hyperlipidemia, unspecified: Secondary | ICD-10-CM | POA: Diagnosis present

## 2016-03-15 DIAGNOSIS — M11262 Other chondrocalcinosis, left knee: Secondary | ICD-10-CM | POA: Diagnosis present

## 2016-03-15 DIAGNOSIS — D72829 Elevated white blood cell count, unspecified: Secondary | ICD-10-CM | POA: Diagnosis present

## 2016-03-15 DIAGNOSIS — K219 Gastro-esophageal reflux disease without esophagitis: Secondary | ICD-10-CM | POA: Diagnosis present

## 2016-03-15 DIAGNOSIS — E118 Type 2 diabetes mellitus with unspecified complications: Secondary | ICD-10-CM | POA: Diagnosis not present

## 2016-03-15 DIAGNOSIS — M11261 Other chondrocalcinosis, right knee: Secondary | ICD-10-CM | POA: Diagnosis present

## 2016-03-15 DIAGNOSIS — Z7982 Long term (current) use of aspirin: Secondary | ICD-10-CM | POA: Diagnosis not present

## 2016-03-15 DIAGNOSIS — R6510 Systemic inflammatory response syndrome (SIRS) of non-infectious origin without acute organ dysfunction: Secondary | ICD-10-CM | POA: Diagnosis present

## 2016-03-15 DIAGNOSIS — Y92239 Unspecified place in hospital as the place of occurrence of the external cause: Secondary | ICD-10-CM | POA: Diagnosis not present

## 2016-03-15 DIAGNOSIS — Z79899 Other long term (current) drug therapy: Secondary | ICD-10-CM | POA: Diagnosis not present

## 2016-03-15 DIAGNOSIS — M109 Gout, unspecified: Secondary | ICD-10-CM | POA: Diagnosis present

## 2016-03-15 DIAGNOSIS — M170 Bilateral primary osteoarthritis of knee: Secondary | ICD-10-CM | POA: Diagnosis present

## 2016-03-15 DIAGNOSIS — E1140 Type 2 diabetes mellitus with diabetic neuropathy, unspecified: Secondary | ICD-10-CM | POA: Diagnosis present

## 2016-03-15 DIAGNOSIS — M150 Primary generalized (osteo)arthritis: Secondary | ICD-10-CM | POA: Diagnosis not present

## 2016-03-15 DIAGNOSIS — R700 Elevated erythrocyte sedimentation rate: Secondary | ICD-10-CM | POA: Diagnosis present

## 2016-03-15 DIAGNOSIS — E872 Acidosis: Secondary | ICD-10-CM | POA: Diagnosis present

## 2016-03-15 LAB — MRSA CULTURE: Culture: NOT DETECTED

## 2016-03-15 LAB — GLUCOSE, CAPILLARY
Glucose-Capillary: 233 mg/dL — ABNORMAL HIGH (ref 65–99)
Glucose-Capillary: 295 mg/dL — ABNORMAL HIGH (ref 65–99)
Glucose-Capillary: 307 mg/dL — ABNORMAL HIGH (ref 65–99)
Glucose-Capillary: 291 mg/dL — ABNORMAL HIGH (ref 65–99)

## 2016-03-15 LAB — URINE CULTURE

## 2016-03-15 MED ORDER — HYDROMORPHONE HCL 1 MG/ML IJ SOLN
1.0000 mg | INTRAMUSCULAR | Status: DC | PRN
  Administered 2016-03-16: 1 mg via INTRAVENOUS
  Filled 2016-03-15: qty 1

## 2016-03-15 NOTE — Telephone Encounter (Incomplete)
Friday am she got ready to get out of bed, and could not.  She was sliding/falling.  Called daughter, she could not help her get up, she ended up on floor.  They called paramedics to come help.  The right knee had swollen up very big, meds not helping any.  Saturday same thing happened again, with paramedics, but pain was worse, so she went to emergency room Saturday late in day.  She is admitted to hospital with pseudogout s/p gel injections.  She is having bilateral arm pain, had right knee aspirated.  Both knees are still hurting her.  The doctor who is seeing her is recommending she proceed with surgery. She is to see a bone specialist today.  She is at [facility].

## 2016-03-15 NOTE — Progress Notes (Signed)
Patient Demographics:    Nancy [name], is a 76 y.o. female, DOB - 15-Jul-1940, PYK:[id number]  Admit date - 03/13/2016   Admitting Physician Norval Morton, MD  Outpatient Primary MD for the patient is FULP, CAMMIE, MD  LOS - 0   Chief Complaint  Patient presents with  . Back Pain        Subjective:    Nancy [name] today has No chest pain,  Knee pain persist, oral opiates not effective, Requiring IV dilaudid    Assessment  & Plan :    Active Problems:   Essential hypertension, benign   Gout   Type 2 diabetes mellitus (HCC)   Uncontrolled type 2 diabetes mellitus with complication (HCC)   Bilateral knee effusions   SIRS (systemic inflammatory response syndrome) (HCC)   Elevated sed rate  Brief summary:- 76 year old diabetic female with history of osteoarthritis as well as pseudogout who was admitted on 03/13/16 with concerns about septic versus inflammatory arthritis after having bilateral knee aspirations with injection of Hyaluronan on 03/10/2016.  ESR was 65 and WBC was 16. Orthopedic service advised against antibiotics as her cell count was only 19,000 on the joint aspirate.  While admitted patient spiked fevers UA nonsuspicious for UTI, no respiratory symptoms, synovial fluid culture pending,  blood cultures pending. She usually sees Dr. Marlou Sa the orthopedic surgeon , consult was done by Dr. 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  Orthopedic service advised against antibiotics as her cell count was only 19,000 on the joint aspirate.   2)Knee Pain- suspect combination  of osteoarthritis and pseudogout , cell count 19,000, gram stain w/o organism, septic arthritis less likley, d/w Dr Lorin Mercy, advised to hold off on Abx for now. Continue physical therapy. ESR is 65. Patient and daughter declines consideration of total knee replacement in the near future. Patient has outpatient rheumatology appointment scheduled for next week  3)Episode of CP- resolved, serial troponin negative  4)SIRS- patient with leukocytosis and elevated lactic acid, knee aspirate Does not look infected, continue IV fluids , cultures are pending, as per ortho service hold off on antibiotics. WBC was 16, white count is down to 13,000 however leukocytosis may persist due to steroid use.  5)Anemia: Hemoglobin is > 10,  appears to be here patient's baseline.  6)Diabetes mellitus type 2-  anticipate worsening glycemic control due to steroid use, Use Novolog/Humalog Sliding scale insulin with Accu-Cheks/Fingersticks as ordered   7)Essential hypertension-  Continue losartan, amlodipine, metoprolol and furosemide  8)History of spinal stenosis s/p TLIF-continue physical therapy  9)Hyperlipidemia- Continue statin   10)Disposition- patient continues to require IV Dilaudid for pain control, oral opiates appeared not to be sufficient to control her bilateral knee pain, patient is also receiving IV Solu-Medrol, unable to discharge home at this time. Therapy notes suggest that patient needs maximum assist of 2 may benefit from rehabilitation placement, however patient adamantly refuses to consider skilled nursing facility for rehabilitation she wants  to go home. Discharge home with pain control is adequate and if cultures are negative. Also glycemic control may get out of hand with high-dose steroids if patient is discharged home prematurely.   DVT prophylaxis:lovenox Code Status:Full Family Communication: Disposition Plan:TBD Consults called:Orthopedic  Admission status:Observation   Lab  Results  Component Value Date   PLT 268 03/14/2016    Inpatient Medications  Scheduled Meds: . allopurinol  200 mg Oral Daily  . amLODipine  10 mg Oral q morning - 10a  . aspirin  243 mg Oral Once  . aspirin EC  81 mg Oral Daily  . colchicine  0.6 mg Oral BID  . enoxaparin (LOVENOX) injection  40 mg Subcutaneous QHS  . furosemide  20 mg Oral Daily  . insulin aspart  0-20 Units Subcutaneous TID WC  . insulin aspart  0-5 Units Subcutaneous QHS  . insulin aspart  3 Units Subcutaneous TID WC  . losartan  50 mg Oral Daily  . methylPREDNISolone (SOLU-MEDROL) injection  40 mg Intravenous Q12H  . metoprolol  100 mg Oral q morning - 10a  . simvastatin  20 mg Oral QPM   Continuous Infusions: PRN Meds:.acetaminophen **OR** acetaminophen, albuterol, HYDROmorphone (DILAUDID) injection, nitroGLYCERIN, ondansetron **OR** ondansetron (ZOFRAN) IV, oxyCODONE, polyvinyl alcohol    Anti-infectives    None        Objective:   Vitals:   03/14/16 1331 03/14/16 1626 03/14/16 2103 03/15/16 0418  BP: (!) 148/75  (!) 151/67 (!) 156/78  Pulse: 93  89 79  Resp: _0 Temp: (!) 100.9 F (38.3 C) 98.1 F (36.7 C) 99 F (37.2 C) 97.7 F (36.5 C)  TempSrc: Oral Oral Oral Oral  SpO2: 100%  94% 100%  Weight:      Height:        Wt Readings from Last 3 Encounters:  03/13/16 82 kg (180 lb 12.4 oz)  12/18/15 79.4 kg (175 lb)  10/29/15 80.3 kg (177 lb)     Intake/Output Summary (Last 24 hours) at 03/15/16 0811 Last data filed at 03/14/16 2244  Gross per 24 hour  Intake              960 ml  Output              325 ml  Net              635 ml     Physical Exam  Gen:- Awake Alert,   HEENT:- [place].AT, No sclera icterus Neck-Supple Neck,No JVD,.  Lungs-  CTAB  CV- S1, S2 normal Abd-  +ve B.Sounds, Abd Soft, No tenderness,    Extremity/Skin:- Knee swelling is slightly better, bilateral knee pain with palpation and range of motion, bilateral knee warmth, no erythema or streaking     Data Review:   Micro Results Recent Results (from the past 240 hour(s))  Blood culture (routine x 2)     Status: None (Preliminary result)   Collection Time: 03/13/16  2:13 PM  Result Value Ref Range Status   Specimen Description BLOOD RIGHT HAND  Final   Special Requests BOTTLES DRAWN AEROBIC AND ANAEROBIC 5 C EACH  Final   Culture   Final    NO GROWTH < 24 HOURS Performed at [facility], [place] [address] [place], [place] [address]    Report Status PENDING  Incomplete  Blood culture (routine x 2)     Status: None (Preliminary result)   Collection Time: 03/13/16  2:38 PM  Result Value Ref Range Status   Specimen Description BLOOD RIGHT HAND  Final   Special Requests BOTTLES DRAWN AEROBIC AND ANAEROBIC 10 CC  Final   Culture   Final    NO GROWTH < 24 HOURS Performed at [facility], [place] [address] [place], [place] [address]    Report Status PENDING  Incomplete  Culture, body fluid-bottle     Status: None (Preliminary result)   Collection Time: 03/13/16  6:34 PM  Result Value Ref Range Status   Specimen Description FLUID  Final   Special Requests NONE  Final   Culture   Final    NO GROWTH < 24 HOURS Performed at [facility], [place] [address], [place], [place] [address]    Report Status PENDING  Incomplete  Gram stain     Status: None   Collection Time: 03/13/16  6:34 PM  Result Value Ref Range Status   Specimen Description FLUID  Final   Special Requests NONE  Final   Gram Stain   Final    ABUNDANT WBC PRESENT, PREDOMINANTLY PMN NO ORGANISMS SEEN Performed at [facility], [place] [address], [place], [place] [address]    Report Status 03/14/2016 FINAL  Final  Urine culture     Status: None (Preliminary result)   Collection Time: 03/13/16  7:12 PM  Result Value Ref Range Status   Specimen Description URINE, RANDOM  Final   Special Requests NONE  Final   Culture PENDING  Incomplete   Report Status PENDING  Incomplete  MRSA PCR Screening     Status:  Abnormal   Collection Time: 03/14/16  2:21 AM  Result Value Ref Range Status   MRSA by PCR INVALID RESULTS, SPECIMEN SENT FOR CULTURE (A) NEGATIVE Final    Comment: T WEAVER AT 0700 ON 03.04.2018 BY NBROOKS        The GeneXpert MRSA Assay (FDA approved for NASAL specimens only), is one component of a comprehensive MRSA colonization surveillance program. It is not intended to diagnose MRSA infection nor to guide or monitor treatment for MRSA infections.     Radiology Reports Dg Chest 2 View  Result Date: 03/13/2016 CLINICAL DATA:  Shortness of breath since yesterday. EXAM: CHEST  2 VIEW COMPARISON:  Chest x-ray dated 07/09/2011. FINDINGS: Study is slightly hypoinspiratory with crowding of the perihilar bronchovascular markings. Given the low lung volumes, lungs appear clear. No pleural effusion or pneumothorax seen. Cardiomediastinal silhouette appears stable. Atherosclerotic changes again noted at the aortic arch. No acute or suspicious osseous finding. Degenerative changes of the thoracic spine, mild to moderate in degree. Surgical clips again noted along the left chest wall. IMPRESSION: No active cardiopulmonary disease. No evidence of pneumonia or pulmonary edema. Aortic atherosclerosis. Electronically Signed   By: Franki Cabot M.D.   On: 03/13/2016 15:56   Dg Knee Complete 4 Views Left  Result Date: 03/13/2016 CLINICAL DATA:  Severe knee pain and swelling. EXAM: LEFT KNEE - COMPLETE 4+ VIEW COMPARISON:  06/27/2015 FINDINGS: Large joint effusion without visible fat component. No visible fracture or malalignment. Tricompartmental osteoarthritis with prominent spurring and generalized moderate joint narrowing. Chondrocalcinosis. Osteopenia and atherosclerosis. IMPRESSION: 1. Large joint effusion without acute osseous finding. 2. Moderate to severe osteoarthritis. 3. Chondrocalcinosis. Electronically Signed   By: Monte Fantasia M.D.   On: 03/13/2016 15:58   Dg Knee Complete 4 Views  Right  Result Date: 03/13/2016 CLINICAL DATA:  Right knee swelling and pain. EXAM: RIGHT KNEE - COMPLETE  4+ VIEW COMPARISON:  06/27/2015 FINDINGS: Very large joint effusion distending the suprapatellar joint space. No underlying fracture or erosive changes noted. No malalignment. Chondrocalcinosis. Mild marginal spurring without joint narrowing. Osteopenia and atherosclerosis. IMPRESSION: 1. Large joint effusion without acute osseous finding. 2. Mild degenerative changes. 3. Chondrocalcinosis. Electronically Signed   By: Monte Fantasia M.D.   On: 03/13/2016 15:57     CBC  Recent Labs Lab 03/13/16 1413 03/14/16 0224  WBC 16.6* 13.0*  HGB 10.9* 10.4*  HCT 31.0* 31.4*  PLT 526* 268  MCV 79.3 80.3  MCH 27.9 26.6  MCHC 35.2 33.1  RDW 19.9* 17.4*  LYMPHSABS 1.5  --   MONOABS 1.5*  --   EOSABS 0.0  --   BASOSABS 0.0  --     Chemistries   Recent Labs Lab 03/13/16 1620 03/14/16 0224  NA 132* 132*  K 3.8 4.1  CL 99* 98*  CO2 23 22  GLUCOSE 196* 148*  BUN 11 11  CREATININE 0.93 0.72  CALCIUM 9.1 9.1  MG  --  1.4*  AST 26  --   ALT 13*  --   ALKPHOS 74  --   BILITOT 1.7*  --    ------------------------------------------------------------------------------------------------------------------ No results for input(s): CHOL, HDL, LDLCALC, TRIG, CHOLHDL, LDLDIRECT in the last 72 hours.  Lab Results  Component Value Date   HGBA1C 7.3 (H) 10/01/2015   ------------------------------------------------------------------------------------------------------------------ No results for input(s): TSH, T4TOTAL, T3FREE, THYROIDAB in the last 72 hours.  Invalid input(s): FREET3 ------------------------------------------------------------------------------------------------------------------ No results for input(s): VITAMINB12, FOLATE, FERRITIN, TIBC, IRON, RETICCTPCT in the last 72 hours.  Coagulation profile No results for input(s): INR, PROTIME in the last 168 hours.  No results for  input(s): DDIMER in the last 72 hours.  Cardiac Enzymes  Recent Labs Lab 03/13/16 2230 03/14/16 0224  TROPONINI <0.03 <0.03   ------------------------------------------------------------------------------------------------------------------ No results found for: BNP   Simona Rocque M.D on 03/15/2016 at 8:11 AM  Between 7am to 7pm - Pager - [phone]  After 7pm go to [email or web address] - password [email or web address]  [facility] -  Office  [phone]  Dragon dictation system was used to create this note, attempts have been made to correct errors, however presence of uncorrected errors is not a reflection quality of care provided

## 2016-03-15 NOTE — Telephone Encounter (Signed)
Monovisc was injected on 03/10/16.

## 2016-03-15 NOTE — Progress Notes (Signed)
CSW met with pt to offer assistance with d/c planning. Pt reports that she will return home at d/c. " I've been to [facility] in the past. I got good care but I'm not going back to rehab. I'm going home. "  RNCM will assist with d/c planning home. CSW will remain available in case plan changes and SNF is needed.  Werner Lean LCSW [phone]

## 2016-03-16 DIAGNOSIS — R6510 Systemic inflammatory response syndrome (SIRS) of non-infectious origin without acute organ dysfunction: Secondary | ICD-10-CM

## 2016-03-16 DIAGNOSIS — R700 Elevated erythrocyte sedimentation rate: Secondary | ICD-10-CM

## 2016-03-16 DIAGNOSIS — M1990 Unspecified osteoarthritis, unspecified site: Secondary | ICD-10-CM

## 2016-03-16 DIAGNOSIS — E1165 Type 2 diabetes mellitus with hyperglycemia: Secondary | ICD-10-CM

## 2016-03-16 DIAGNOSIS — E118 Type 2 diabetes mellitus with unspecified complications: Secondary | ICD-10-CM

## 2016-03-16 DIAGNOSIS — M11262 Other chondrocalcinosis, left knee: Secondary | ICD-10-CM

## 2016-03-16 DIAGNOSIS — M150 Primary generalized (osteo)arthritis: Secondary | ICD-10-CM

## 2016-03-16 DIAGNOSIS — I10 Essential (primary) hypertension: Secondary | ICD-10-CM

## 2016-03-16 LAB — CBC WITH DIFFERENTIAL/PLATELET
Basophils Absolute: 0 10*3/uL (ref 0.0–0.1)
Basophils Relative: 0 %
EOS ABS: 0 10*3/uL (ref 0.0–0.7)
Eosinophils Relative: 0 %
HEMATOCRIT: 28.1 % — AB (ref 36.0–46.0)
HEMOGLOBIN: 9.4 g/dL — AB (ref 12.0–15.0)
LYMPHS ABS: 0.7 10*3/uL (ref 0.7–4.0)
Lymphocytes Relative: 6 %
MCH: 26.3 pg (ref 26.0–34.0)
MCHC: 33.5 g/dL (ref 30.0–36.0)
MCV: 78.5 fL (ref 78.0–100.0)
MONO ABS: 0.5 10*3/uL (ref 0.1–1.0)
MONOS PCT: 5 %
NEUTROS ABS: 10.5 10*3/uL — AB (ref 1.7–7.7)
NEUTROS PCT: 89 %
Platelets: 355 10*3/uL (ref 150–400)
RBC: 3.58 MIL/uL — ABNORMAL LOW (ref 3.87–5.11)
RDW: 16.9 % — ABNORMAL HIGH (ref 11.5–15.5)
WBC: 11.8 10*3/uL — ABNORMAL HIGH (ref 4.0–10.5)

## 2016-03-16 LAB — BASIC METABOLIC PANEL
Anion gap: 11 (ref 5–15)
BUN: 25 mg/dL — ABNORMAL HIGH (ref 6–20)
CALCIUM: 9.6 mg/dL (ref 8.9–10.3)
CHLORIDE: 101 mmol/L (ref 101–111)
CO2: 25 mmol/L (ref 22–32)
CREATININE: 0.78 mg/dL (ref 0.44–1.00)
GFR calc Af Amer: >60 mL/min (ref >60)
GFR calc non Af Amer: >60 mL/min (ref >60)
Glucose, Bld: 273 mg/dL — ABNORMAL HIGH (ref 65–99)
Potassium: 3.7 mmol/L (ref 3.5–5.1)
Sodium: 137 mmol/L (ref 135–145)

## 2016-03-16 LAB — GLUCOSE, CAPILLARY
GLUCOSE-CAPILLARY: 254 mg/dL — AB (ref 65–99)
GLUCOSE-CAPILLARY: 267 mg/dL — AB (ref 65–99)

## 2016-03-16 MED ORDER — GABAPENTIN 600 MG PO TABS
300.0000 mg | ORAL_TABLET | Freq: Three times a day (TID) | ORAL | Status: DC
  Filled 2016-03-16 (×2): qty 0.5

## 2016-03-16 MED ORDER — GLIPIZIDE 10 MG PO TABS
10.0000 mg | ORAL_TABLET | Freq: Every day | ORAL | Status: DC
  Administered 2016-03-16: 10 mg via ORAL
  Filled 2016-03-16: qty 1

## 2016-03-16 MED ORDER — DICLOFENAC SODIUM 1 % TD GEL: 2.0000 g | Freq: Four times a day (QID) | TRANSDERMAL | Status: DC | PRN

## 2016-03-16 MED ORDER — COLCHICINE 0.6 MG PO TABS: 0.6000 mg | ORAL_TABLET | Freq: Two times a day (BID) | ORAL | Status: DC

## 2016-03-16 MED ORDER — GABAPENTIN 300 MG PO CAPS
300.0000 mg | ORAL_CAPSULE | Freq: Three times a day (TID) | ORAL | Status: DC
  Administered 2016-03-16 (×2): 300 mg via ORAL
  Filled 2016-03-16 (×2): qty 1

## 2016-03-16 MED ORDER — DICLOFENAC SODIUM 1 % TD GEL
2.0000 g | Freq: Four times a day (QID) | TRANSDERMAL | Status: DC
  Administered 2016-03-16 (×2): 2 g via TOPICAL
  Filled 2016-03-16: qty 100

## 2016-03-16 MED ORDER — METFORMIN HCL 500 MG PO TABS
1000.0000 mg | ORAL_TABLET | Freq: Two times a day (BID) | ORAL | Status: DC
  Administered 2016-03-16: 1000 mg via ORAL
  Filled 2016-03-16: qty 2

## 2016-03-16 NOTE — Care Management Note (Signed)
Case Management Note  Patient Details  Name: Nancy [name] MRN: [id number] Date of Birth: [birth year]  Subjective/Objective:     76 yo admitted with Bilateral Knee Effusions.                Action/Plan: Pt declines SNF and would like to go home with home health services. Pt states she would like to use [facility] as she has used them before. [facility] rep contacted for referral and MD orders received. No other CM needs communicated.   Expected Discharge Date:  03/16/16               Expected Discharge Plan:  [place]  In-House Referral:     Discharge planning Services  CM Consult  Post Acute Care Choice:  Home Health Choice offered to:  Patient  DME Arranged:    DME Agency:     HH Arranged:  PT [place]:  [place]  Status of Service:  In process, will continue to follow  If discussed at Long Length of Stay Meetings, dates discussed:    Additional CommentsLynnell Catalan, RN 03/16/2016, 1:41 PM  [phone]

## 2016-03-16 NOTE — Progress Notes (Signed)
Physical Therapy Treatment Patient Details Name: LATECIA IPPOLITO MRN: [id number] DOB: [birth year] Today's Date: 03/16/2016    History of Present Illness  FENET GUIDETTI is a 76 y.o. female with medical history significant of HTN, HLD, DM type II, gout, osteoarthritis, and chronic back pain (Sept/OCt 2017 wound vac in back); who presents with complaints of knee pain and swelling. Patient is followed by Dr. Marlou Sa of orthopedics for her osteoarthritis of her knees. Patient was noted to have significant swelling and was seen in the office 4 days ago, and underwent bilateral arthrocentesis with Hyaluronan injection. Patient notes this was her first "gel" injection in both her knees and previously had only had steroid injections in the past. Patient notes the following morning she woke up and noted recurrence of swelling in both knees worse on the right. Patient tried utilizing wraps and ice packs without relief of symptoms. Symptoms and it unbearable for her to ambulate.     PT Comments    Assisted OOB to amb to [facility] then further in hallway.  Overall required Min Assist to [place] to have family at home and has a walker and a cane.  Pt progressing.  Follow Up Recommendations  Home health PT;Supervision - Intermittent (pt declines SNF)     Equipment Recommendations  None recommended by PT    Recommendations for Other Services       Precautions / Restrictions Precautions Precautions: Fall Precaution Comments: back surgery Sept 2017 Restrictions Weight Bearing Restrictions: No    Mobility  Bed Mobility Overal bed mobility: Needs Assistance Bed Mobility: Supine to Sit     Supine to sit: Min assist     General bed mobility comments: min assist for upper body all else Supervision with increased time  Transfers Overall transfer level: Needs assistance Equipment used: Rolling walker (2 wheeled) Transfers: Sit to/from [facility] Sit to Stand: Min  guard;Min assist Stand pivot transfers: Min guard;Min assist       General transfer comment: 25% VC's on safety with turns and hand placement on B BSC arm rest to avoid pulling up on walker   Ambulation/Gait Ambulation/Gait assistance: Min guard Ambulation Distance (Feet): 125 Feet Assistive device: Rolling walker (2 wheeled) Gait Pattern/deviations: Step-through pattern;Shuffle Gait velocity: decreased   General Gait Details: slow but steady gait with increased amb distance.  25% VC's on safety with turns and backward gait completion to recliner.     Stairs            Wheelchair Mobility    Modified Rankin (Stroke Patients Only)       Balance                                    Cognition Arousal/Alertness: Awake/alert Behavior During Therapy: WFL for tasks assessed/performed Overall Cognitive Status: Within Functional Limits for tasks assessed                      Exercises      General Comments        Pertinent Vitals/Pain Pain Assessment: 0-10 Pain Score: 3  Pain Location: Both knees "pressure", top R foot "gout" and R elbow Pain Descriptors / Indicators: Aching;Pressure Pain Intervention(s): Monitored during session;Repositioned    Home Living                      Prior Function  PT Goals (current goals can now be found in the care plan section) Progress towards PT goals: Progressing toward goals    Frequency    Min 3X/week      PT Plan Current plan remains appropriate    Co-evaluation             End of Session Equipment Utilized During Treatment: Gait belt Activity Tolerance: Patient tolerated treatment well Patient left: in chair;with call bell/phone within reach;with chair alarm set Nurse Communication: Mobility status (plans to D/C to home, will need HH PT, NO equipment(has all)) PT Visit Diagnosis: Pain;Muscle weakness (generalized) (M62.81) Pain - Right/Left: Right Pain - part of  body: Leg     Time: 1120-1145 PT Time Calculation (min) (ACUTE ONLY): 25 min  Charges:  $Gait Training: 8-22 mins $Therapeutic Activity: 8-22 mins                    G Codes:       Rica Koyanagi  PTA [facility]  Acute  Rehab Pager      [phone]

## 2016-03-16 NOTE — Progress Notes (Signed)
Patient d/c home,stable. 

## 2016-03-16 NOTE — Discharge Summary (Signed)
Physician Discharge Summary  Nancy [name] NHA:[id number] DOB: Nov 19, 1940 DOA: 03/13/2016  PCP: Antony Blackbird, MD  Admit date: 03/13/2016 Discharge date: 03/16/2016  Admitted From: Home Disposition:  Home - has declined [facility]:  PT ordered  Equipment/Devices:      Discharge Condition:  stable   CODE STATUS:  Full code   Diet recommendation:  Heart healthy Consultations:  orthopedics    Discharge Diagnoses:  Principal Problem:   Pseudogout of bilateral knees Active Problems:   SIRS (systemic inflammatory response syndrome) (HCC)   Elevated sed rate   Osteoarthritis   Essential hypertension, benign   Status post lumbar spinal fusion   Uncontrolled type 2 diabetes mellitus with complication (HCC)    Subjective: Pain and swelling in knees has improved significantly.  Having some tenderness in right elblow for a few days. No swelling.   Brief Summary: 76 year old diabetic female with history of DM 2 with neuropathy, GERD, HTN, chronic back pain, osteoarthritis and pseudogout. Total knee arthroplasty has been discussed with patient by Dr. Marlou Sa but they have been hesitant to proceed due to difficulty getting over her last lumbar procedure. She was admitted on 03/13/16 with b/l knees swelling and pain.  She underwent bilateral knee injection of Hyaluronan on 03/10/2016 by Dr Marlou Sa. The following morning she woke up and noted recurrence of swelling in both knees worse on the right. Patient tried utilizing wraps and ice packs without relief of symptoms- unbearable for her to ambulate. 2 days ago she called office due pain and was prescribed Tylenol # 3. Patient's reports taking this medication without any significant relief of symptoms either. Had low grade temp of about 100. In ER, ESR was 65 and WBC was 16. Knee aspirated in ER and synovial fluid consistent with pseudogout. Stared on IV steroids and Colchicine.   Hospital Course:  Pseudogout superimposed on advanced  osteoarthritis of both knees with SIRS - treated with IV Solumedrol x 3 days and Colchicine- knee pain and swelling nearly resolved - will d/c with a few more days of BID colchicine - also has right elbow pain, no swelling noted and not significant tenderness- can apply Voltaren gel which I have ordered - has a Rheum appt already for 3/14 Consulted on by Dr Lorin Mercy on 3/4 who stated if no improvement with current treatment , intra articular steroids in 1 wk.   Hyperglycemia due to Solumedrol with underlying DM - given insulin in the hospital- resumed her home dose of Glipizide and Metformin  Essential hypertension - Continue losartan, amlodipine, metoprolol and furosemide  History of spinal stenosis - s/p surgery by Dr Rolena Infante - she has a f/u appt next week  Discharge Instructions  Discharge Instructions    Diet - low sodium heart healthy    Complete by:  As directed    Increase activity slowly    Complete by:  As directed      Allergies as of 03/16/2016      Reactions   Morphine And Related Swelling   10/02/15 - given Toradol + morphine and RN noted tongue swelling - given Epi + Benadryl Has had morphine in past w/o problem   Toradol [ketorolac Tromethamine] Swelling   10/02/15 - given Toradol + morphine and RN noted tongue swelling - given Epi + Benadryl Has taken ibuprofen in past w/o problem   Codeine Other (See Comments)   headache      Medication List    TAKE these medications  allopurinol 100 MG tablet Commonly known as:  ZYLOPRIM Take 200 mg by mouth daily.   amLODipine 10 MG tablet Commonly known as:  NORVASC Take 10 mg by mouth every morning.   aspirin EC 81 MG tablet Take 81 mg by mouth daily.   COLCRYS 0.6 MG tablet Generic drug:  colchicine Take 0.6 mg by mouth daily as needed (Gout). What changed:  Another medication with the same name was added. Make sure you understand how and when to take each.   colchicine 0.6 MG tablet Take 1 tablet (0.6 mg  total) by mouth 2 (two) times daily. Take for 3 more days and then stop. What changed:  You were already taking a medication with the same name, and this prescription was added. Make sure you understand how and when to take each.   diclofenac sodium 1 % Gel Commonly known as:  VOLTAREN Apply 2 g topically 4 (four) times daily as needed (tender joints).   ferrous sulfate 325 (65 FE) MG tablet Take 1 tablet (325 mg total) by mouth 3 (three) times daily with meals.   furosemide 20 MG tablet Commonly known as:  LASIX Take 1 tablet (20 mg total) by mouth daily.   GAVILYTE-N WITH FLAVOR PACK 420 g solution Generic drug:  polyethylene glycol-electrolytes Take 1 Package by mouth as directed.   glipiZIDE 5 MG tablet Commonly known as:  GLUCOTROL Take 5 mg by mouth daily before breakfast.   insulin aspart 100 UNIT/ML injection Commonly known as:  novoLOG Inject 0-5 Units into the skin at bedtime.   losartan 50 MG tablet Commonly known as:  COZAAR Take 50 mg by mouth daily.   metFORMIN 1000 MG tablet Commonly known as:  GLUCOPHAGE Take 1,000 mg by mouth 2 (two) times daily with a meal.   metoprolol 100 MG tablet Commonly known as:  LOPRESSOR Take 100 mg by mouth every morning.   ondansetron 4 MG tablet Commonly known as:  ZOFRAN Take 4 mg by mouth every 8 (eight) hours as needed for nausea or vomiting.   simvastatin 20 MG tablet Commonly known as:  ZOCOR Take 20 mg by mouth every evening.   SYSTANE OP Apply 1 drop to eye as needed (Dry eyes).   traMADol 50 MG tablet Commonly known as:  ULTRAM Take 1 tablet (50 mg total) by mouth every 6 (six) hours as needed.   traZODone 50 MG tablet Commonly known as:  DESYREL Take 50-100 mg by mouth at bedtime as needed for sleep.       Allergies  Allergen Reactions  . Morphine And Related Swelling    10/02/15 - given Toradol + morphine and RN noted tongue swelling - given Epi + Benadryl Has had morphine in past w/o problem  .  Toradol [Ketorolac Tromethamine] Swelling    10/02/15 - given Toradol + morphine and RN noted tongue swelling - given Epi + Benadryl Has taken ibuprofen in past w/o problem  . Codeine Other (See Comments)    headache     Procedures/Studies: Rt knee aspiration   Dg Chest 2 View  Result Date: 03/13/2016 CLINICAL DATA:  Shortness of breath since yesterday. EXAM: CHEST  2 VIEW COMPARISON:  Chest x-ray dated 07/09/2011. FINDINGS: Study is slightly hypoinspiratory with crowding of the perihilar bronchovascular markings. Given the low lung volumes, lungs appear clear. No pleural effusion or pneumothorax seen. Cardiomediastinal silhouette appears stable. Atherosclerotic changes again noted at the aortic arch. No acute or suspicious osseous finding. Degenerative changes of the  thoracic spine, mild to moderate in degree. Surgical clips again noted along the left chest wall. IMPRESSION: No active cardiopulmonary disease. No evidence of pneumonia or pulmonary edema. Aortic atherosclerosis. Electronically Signed   By: Franki Cabot M.D.   On: 03/13/2016 15:56   Dg Knee Complete 4 Views Left  Result Date: 03/13/2016 CLINICAL DATA:  Severe knee pain and swelling. EXAM: LEFT KNEE - COMPLETE 4+ VIEW COMPARISON:  06/27/2015 FINDINGS: Large joint effusion without visible fat component. No visible fracture or malalignment. Tricompartmental osteoarthritis with prominent spurring and generalized moderate joint narrowing. Chondrocalcinosis. Osteopenia and atherosclerosis. IMPRESSION: 1. Large joint effusion without acute osseous finding. 2. Moderate to severe osteoarthritis. 3. Chondrocalcinosis. Electronically Signed   By: Monte Fantasia M.D.   On: 03/13/2016 15:58   Dg Knee Complete 4 Views Right  Result Date: 03/13/2016 CLINICAL DATA:  Right knee swelling and pain. EXAM: RIGHT KNEE - COMPLETE 4+ VIEW COMPARISON:  06/27/2015 FINDINGS: Very large joint effusion distending the suprapatellar joint space. No underlying  fracture or erosive changes noted. No malalignment. Chondrocalcinosis. Mild marginal spurring without joint narrowing. Osteopenia and atherosclerosis. IMPRESSION: 1. Large joint effusion without acute osseous finding. 2. Mild degenerative changes. 3. Chondrocalcinosis. Electronically Signed   By: Monte Fantasia M.D.   On: 03/13/2016 15:57       Discharge Exam: Vitals:   03/16/16 0419 03/16/16 1022  BP: 128/77 135/60  Pulse: 73 76  Resp: 16   Temp: 98.3 F (36.8 C)    Vitals:   03/15/16 1445 03/15/16 2021 03/16/16 0419 03/16/16 1022  BP: (!) 144/74 134/64 128/77 135/60  Pulse: 78 76 73 76  Resp: _0 Temp: 98.6 F (37 C) 98.3 F (36.8 C) 98.3 F (36.8 C)   TempSrc: Oral Oral Oral   SpO2: 99% 98% 99%   Weight:      Height:        General: Pt is alert, awake, not in acute distress Cardiovascular: RRR, S1/S2 +, no rubs, no gallops Respiratory: CTA bilaterally, no wheezing, no rhonchi Abdominal: Soft, NT, ND, bowel sounds + Extremities: no edema, no cyanosis    The results of significant diagnostics from this hospitalization (including imaging, microbiology, ancillary and laboratory) are listed below for reference.     Microbiology: Recent Results (from the past 240 hour(s))  Blood culture (routine x 2)     Status: None (Preliminary result)   Collection Time: 03/13/16  2:13 PM  Result Value Ref Range Status   Specimen Description BLOOD RIGHT HAND  Final   Special Requests BOTTLES DRAWN AEROBIC AND ANAEROBIC 5 C EACH  Final   Culture   Final    NO GROWTH 2 DAYS Performed at [facility], [address] [place], [place] [address]    Report Status PENDING  Incomplete  Blood culture (routine x 2)     Status: None (Preliminary result)   Collection Time: 03/13/16  2:38 PM  Result Value Ref Range Status   Specimen Description BLOOD RIGHT HAND  Final   Special Requests BOTTLES DRAWN AEROBIC AND ANAEROBIC 10 CC  Final   Culture   Final    NO GROWTH 2  DAYS Performed at [facility], [place] [address], [place], [place] [address]    Report Status PENDING  Incomplete  Culture, body fluid-bottle     Status: None (Preliminary result)   Collection Time: 03/13/16  6:34 PM  Result Value Ref Range Status   Specimen Description FLUID  Final   Special Requests NONE  Final   Culture   Final    NO GROWTH 2 DAYS Performed at [facility], [place] [address], [place], [place] [address]    Report Status PENDING  Incomplete  Gram stain     Status: None   Collection Time: 03/13/16  6:34 PM  Result Value Ref Range Status   Specimen Description FLUID  Final   Special Requests NONE  Final   Gram Stain   Final    ABUNDANT WBC PRESENT, PREDOMINANTLY PMN NO ORGANISMS SEEN Performed at [facility], [place] [address] [place], [place] [address]    Report Status 03/14/2016 FINAL  Final  Urine culture     Status: Abnormal   Collection Time: 03/13/16  7:12 PM  Result Value Ref Range Status   Specimen Description URINE, RANDOM  Final   Special Requests NONE  Final   Culture MULTIPLE SPECIES PRESENT, SUGGEST RECOLLECTION (A)  Final   Report Status 03/15/2016 FINAL  Final  MRSA PCR Screening     Status: Abnormal   Collection Time: 03/14/16  2:21 AM  Result Value Ref Range Status   MRSA by PCR INVALID RESULTS, SPECIMEN SENT FOR CULTURE (A) NEGATIVE Final    Comment: T WEAVER AT 0700 ON 03.04.2018 BY NBROOKS        The GeneXpert MRSA Assay (FDA approved for NASAL specimens only), is one component of a comprehensive MRSA colonization surveillance program. It is not intended to diagnose MRSA infection nor to guide or monitor treatment for MRSA infections.   MRSA culture     Status: None   Collection Time: 03/14/16  2:21 AM  Result Value Ref Range Status   Specimen Description NOSE  Final   Special Requests NONE  Final   Culture   Final    NO MRSA DETECTED Performed at [facility], [address] [place], [place] [address]     Report Status 03/15/2016 FINAL  Final  Culture, blood (Routine X 2) w Reflex to ID Panel     Status: None (Preliminary result)   Collection Time: 03/14/16  1:11 PM  Result Value Ref Range Status   Specimen Description BLOOD LEFT HAND  Final   Special Requests IN PEDIATRIC BOTTLE 2CC  Final   Culture   Final    NO GROWTH < 24 HOURS Performed at [facility], [place] [address] [place], [place] [address]    Report Status PENDING  Incomplete  Culture, blood (Routine X 2) w Reflex to ID Panel     Status: None (Preliminary result)   Collection Time: 03/14/16  1:35 PM  Result Value Ref Range Status   Specimen Description BLOOD LEFT HAND  Final   Special Requests IN PEDIATRIC BOTTLE 0.5 CC  Final   Culture   Final    NO GROWTH < 24 HOURS Performed at [facility], [place] [address] [place], [place] [address]    Report Status PENDING  Incomplete     Labs: BNP (last 3 results) No results for input(s): BNP in the last 8760 hours. Basic Metabolic Panel:  Recent Labs Lab 03/13/16 1620 03/14/16 0224 03/16/16 0420  NA 132* 132* 137  K 3.8 4.1 3.7  CL 99* 98* 101  CO2 _0 GLUCOSE 196* 148* 273*  BUN 11 11 25*  CREATININE 0.93 0.72 0.78  CALCIUM 9.1 9.1 9.6  MG  --  1.4*  --  Liver Function Tests:  Recent Labs Lab 03/13/16 1620  AST 26  ALT 13*  ALKPHOS 74  BILITOT 1.7*  PROT 8.3*  ALBUMIN 3.0*   No results for input(s): LIPASE, AMYLASE in the last 168 hours. No results for input(s): AMMONIA in the last 168 hours. CBC:  Recent Labs Lab 03/13/16 1413 03/14/16 0224 03/16/16 0420  WBC 16.6* 13.0* 11.8*  NEUTROABS 13.6*  --  10.5*  HGB 10.9* 10.4* 9.4*  HCT 31.0* 31.4* 28.1*  MCV 79.3 80.3 78.5  PLT 526* 268 355   Cardiac Enzymes:  Recent Labs Lab 03/13/16 2230 03/14/16 0224  TROPONINI <0.03 <0.03   BNP: Invalid input(s): POCBNP CBG:  Recent Labs Lab 03/15/16 1227 03/15/16 1820 03/15/16 2104 03/16/16 0829 03/16/16 1222  GLUCAP 295*  307* 291* 254* 267*   D-Dimer No results for input(s): DDIMER in the last 72 hours. Hgb A1c No results for input(s): HGBA1C in the last 72 hours. Lipid Profile No results for input(s): CHOL, HDL, LDLCALC, TRIG, CHOLHDL, LDLDIRECT in the last 72 hours. Thyroid function studies No results for input(s): TSH, T4TOTAL, T3FREE, THYROIDAB in the last 72 hours.  Invalid input(s): FREET3 Anemia work up No results for input(s): VITAMINB12, FOLATE, FERRITIN, TIBC, IRON, RETICCTPCT in the last 72 hours. Urinalysis    Component Value Date/Time   COLORURINE YELLOW 03/14/2016 1226   APPEARANCEUR CLEAR 03/14/2016 1226   LABSPEC 1.006 03/14/2016 1226   PHURINE 6.0 03/14/2016 1226   GLUCOSEU 150 (A) 03/14/2016 1226   HGBUR NEGATIVE 03/14/2016 1226   BILIRUBINUR NEGATIVE 03/14/2016 1226   KETONESUR NEGATIVE 03/14/2016 1226   PROTEINUR NEGATIVE 03/14/2016 1226   UROBILINOGEN 1.0 12/10/2006 0637   NITRITE NEGATIVE 03/14/2016 1226   LEUKOCYTESUR NEGATIVE 03/14/2016 1226   Sepsis Labs Invalid input(s): PROCALCITONIN,  WBC,  LACTICIDVEN Microbiology Recent Results (from the past 240 hour(s))  Blood culture (routine x 2)     Status: None (Preliminary result)   Collection Time: 03/13/16  2:13 PM  Result Value Ref Range Status   Specimen Description BLOOD RIGHT HAND  Final   Special Requests BOTTLES DRAWN AEROBIC AND ANAEROBIC 5 C EACH  Final   Culture   Final    NO GROWTH 2 DAYS Performed at [facility], [place] [address] [place], [place] [address]    Report Status PENDING  Incomplete  Blood culture (routine x 2)     Status: None (Preliminary result)   Collection Time: 03/13/16  2:38 PM  Result Value Ref Range Status   Specimen Description BLOOD RIGHT HAND  Final   Special Requests BOTTLES DRAWN AEROBIC AND ANAEROBIC 10 CC  Final   Culture   Final    NO GROWTH 2 DAYS Performed at [facility], [place] [address], [place], [place] [address]    Report Status PENDING  Incomplete   Culture, body fluid-bottle     Status: None (Preliminary result)   Collection Time: 03/13/16  6:34 PM  Result Value Ref Range Status   Specimen Description FLUID  Final   Special Requests NONE  Final   Culture   Final    NO GROWTH 2 DAYS Performed at [place] [address], [place], [place] [address]    Report Status PENDING  Incomplete  Gram stain     Status: None   Collection Time: 03/13/16  6:34 PM  Result Value Ref Range Status   Specimen Description FLUID  Final   Special Requests NONE  Final  Gram Stain   Final    ABUNDANT WBC PRESENT, PREDOMINANTLY PMN NO ORGANISMS SEEN Performed at [facility], [place] [address] [place], [place] [address]    Report Status 03/14/2016 FINAL  Final  Urine culture     Status: Abnormal   Collection Time: 03/13/16  7:12 PM  Result Value Ref Range Status   Specimen Description URINE, RANDOM  Final   Special Requests NONE  Final   Culture MULTIPLE SPECIES PRESENT, SUGGEST RECOLLECTION (A)  Final   Report Status 03/15/2016 FINAL  Final  MRSA PCR Screening     Status: Abnormal   Collection Time: 03/14/16  2:21 AM  Result Value Ref Range Status   MRSA by PCR INVALID RESULTS, SPECIMEN SENT FOR CULTURE (A) NEGATIVE Final    Comment: T WEAVER AT 0700 ON 03.04.2018 BY NBROOKS        The GeneXpert MRSA Assay (FDA approved for NASAL specimens only), is one component of a comprehensive MRSA colonization surveillance program. It is not intended to diagnose MRSA infection nor to guide or monitor treatment for MRSA infections.   MRSA culture     Status: None   Collection Time: 03/14/16  2:21 AM  Result Value Ref Range Status   Specimen Description NOSE  Final   Special Requests NONE  Final   Culture   Final    NO MRSA DETECTED Performed at [facility], [address] [place], [place] [address]    Report Status 03/15/2016 FINAL  Final  Culture, blood (Routine X 2) w Reflex to ID Panel     Status: None (Preliminary  result)   Collection Time: 03/14/16  1:11 PM  Result Value Ref Range Status   Specimen Description BLOOD LEFT HAND  Final   Special Requests IN PEDIATRIC BOTTLE 2CC  Final   Culture   Final    NO GROWTH < 24 HOURS Performed at [facility], [place] [address], [place], [place] [address]    Report Status PENDING  Incomplete  Culture, blood (Routine X 2) w Reflex to ID Panel     Status: None (Preliminary result)   Collection Time: 03/14/16  1:35 PM  Result Value Ref Range Status   Specimen Description BLOOD LEFT HAND  Final   Special Requests IN PEDIATRIC BOTTLE 0.5 CC  Final   Culture   Final    NO GROWTH < 24 HOURS Performed at [facility], [place] [address] [place], [place] [address]    Report Status PENDING  Incomplete     Time coordinating discharge: Over 30 minutes  SIGNED:   Debbe Odea, MD  [facility] 03/16/2016, 1:16 PM Pager   If 7PM-7AM, please contact night-coverage [email or web address] Password [email or web address]

## 2016-03-16 NOTE — Progress Notes (Signed)
Discharge instructions given to patient,verbalized understanding. All questions answered appropriately.Patient is stable.

## 2016-03-17 NOTE — Telephone Encounter (Signed)
I called Nancy [name] yesterday.  She has pseudogout in her knees.  Discharged from [facility].  She is to hold off on knee replacement surgery.  She will come in for repeat injections as needed

## 2016-03-18 ENCOUNTER — Other Ambulatory Visit (INDEPENDENT_AMBULATORY_CARE_PROVIDER_SITE_OTHER): Payer: Self-pay | Admitting: Orthopedic Surgery

## 2016-03-18 LAB — CULTURE, BLOOD (ROUTINE X 2)
CULTURE: NO GROWTH
Culture: NO GROWTH

## 2016-03-18 LAB — CULTURE, BODY FLUID-BOTTLE: CULTURE: NO GROWTH

## 2016-03-18 LAB — CULTURE, BODY FLUID W GRAM STAIN -BOTTLE

## 2016-03-18 NOTE — Telephone Encounter (Signed)
Rx request, #15 tablets filled on 03/11/2016, please advise

## 2016-03-18 NOTE — Telephone Encounter (Signed)
[facility] pharm LMVM with Rx 

## 2016-03-19 LAB — CULTURE, BLOOD (ROUTINE X 2)
CULTURE: NO GROWTH
CULTURE: NO GROWTH

## 2016-03-22 ENCOUNTER — Ambulatory Visit: Payer: Medicare Other | Admitting: Podiatry

## 2016-04-01 ENCOUNTER — Encounter (INDEPENDENT_AMBULATORY_CARE_PROVIDER_SITE_OTHER): Payer: Self-pay | Admitting: Orthopedic Surgery

## 2016-04-01 ENCOUNTER — Ambulatory Visit (INDEPENDENT_AMBULATORY_CARE_PROVIDER_SITE_OTHER): Payer: Medicare Other | Admitting: Orthopedic Surgery

## 2016-04-01 DIAGNOSIS — M170 Bilateral primary osteoarthritis of knee: Secondary | ICD-10-CM | POA: Diagnosis not present

## 2016-04-01 MED ORDER — TRAMADOL HCL 50 MG PO TABS: 50.0000 mg | ORAL_TABLET | Freq: Two times a day (BID) | ORAL | 0 refills | Status: DC | PRN

## 2016-04-01 NOTE — Progress Notes (Signed)
Office Visit Note   Patient: Nancy [name]           Date of Birth: 1940-10-17           MRN: [id number] Visit Date: 04/01/2016 Requested by: Antony Blackbird, MD [phone], [place] [address] PCP: Antony Blackbird, MD  Subjective: Chief Complaint  Patient presents with  . Left Knee - Follow-up  . Right Knee - Follow-up    HPI: Nancy [name] is a 76 year old patient with bilateral knee pain.  She has end-stage knee arthritis but is very reluctant to undergo knee replacement.  States that her back is doing well.  Left knee hurts worse than the right.  She does report some occasional back pain but it's dull in nature.  She did have a back infection but currently she is off antibiotics.  She's taking tramadol for pain.  She wants to go to physical therapy to see if that can help her back in her legs.  She is using a walker.  Last night her pain was worse.  She feels like she is "still not strong enough for knee surgery".  She also states that every time she hurts she localizes it to her back.  She was diagnosed with pseudogout the last time she had an injection in her knees.  I'm hesitant to do any more injections in the knees.              ROS: All systems reviewed are negative as they relate to the chief complaint within the history of present illness.  Patient denies  fevers or chills.   Assessment & Plan: Visit Diagnoses:  1. Primary osteoarthritis of both knees     Plan: Impression is endstage bilateral knee arthritis with progressive flexion contractures and loss of motion.  I don't think any further conservative nonoperative therapy is indicated for [facility] at this time.  She has exhausted all measures.  Her choice now is to either live with the pain or undergo knee replacement.  That'll be risky considering her back injection.  I'm going to refill her tramadol and I'll see her back as needed.  Follow-Up Instructions: Return if symptoms worsen or fail to improve.   Orders:    No orders of the defined types were placed in this encounter.  No orders of the defined types were placed in this encounter.     Procedures: No procedures performed   Clinical Data: No additional findings.  Objective: Vital Signs: There were no vitals taken for this visit.  Physical Exam:   Constitutional: Patient appears well-developed HEENT:  Head: Normocephalic Eyes:EOM are normal Neck: Normal range of motion Cardiovascular: Normal rate Pulmonary/chest: Effort normal Neurologic: Patient is alert Skin: Skin is warm Psychiatric: Patient has normal mood and affect    Ortho Exam: On orthopedic exam the patient is in a wheelchair.  She has knee range of motion on the left 18 and 90 on the right tendon 95.  No effusion on the left trace effusion on the right.  Extensor mechanism is intact bilaterally.  Knees themselves are not warm.  Pedal pulses palpable.  Ankle dorsiflexion intact.  No groin pain on either side with internal/external rotation of the leg.  Specialty Comments:  No specialty comments available.  Imaging: No results found.   PMFS History: Patient Active Problem List   Diagnosis Date Noted  . Pseudogout of bilateral knees 03/16/2016  . Osteoarthritis 03/16/2016  . SIRS (systemic inflammatory response syndrome) ([place]) 03/14/2016  .  Elevated sed rate 03/14/2016  . Primary osteoarthritis of both knees 01/01/2016  . Postoperative wound infection 12/18/2015  . Difficulty in walking, not elsewhere classified   . Status post lumbar spinal fusion   . Uncontrolled type 2 diabetes mellitus with complication ([place])   . 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 Angioedema   . Hyperlipidemia 10/01/2015  . Gait instability 02/19/2013  . Pulmonary hyperinflation 01/17/2013  . Diastolic dysfunction   . Essential hypertension, benign 01/03/2013   Past Medical History:  Diagnosis Date  . Allergy   . Breast cyst    left breast  . Chest pain   .  Complication of anesthesia 1986   slow to awaken after cholecystectomy  . Diastolic dysfunction    with mild pulmonary HTN by echo 07/2011  . Gastric ulcer   . GERD (gastroesophageal reflux disease)   . Gout   . Hyperlipidemia   . Hypertension   . 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 Osteoarthritis   . Osteopenia   . Shortness of breath dyspnea    with activity  . Spine deformity    lower spine  . Type II diabetes mellitus (H[facility])     Family History  Problem Relation Age of Onset  . Heart disease Mother   . Cancer Sister     ovarian    Past Surgical History:  Procedure Laterality Date  . ABDOMINAL ADHESION SURGERY    . ABDOMINAL HYSTERECTOMY    . ANUS SURGERY     for torn tissue  . APPENDECTOMY    . BREAST BIOPSY    . BREAST LUMPECTOMY     left  . CHOLECYSTECTOMY  1986  . COLONOSCOPY    . ESOPHAGOGASTRODUODENOSCOPY (EGD) WITH PROPOFOL N/A 07/18/2013   Procedure: ESOPHAGOGASTRODUODENOSCOPY (EGD) WITH PROPOFOL;  Surgeon: Arta Silence, MD;  Location: [facility];  Service: Endoscopy;  Laterality: N/A;  . EUS N/A 07/18/2013   Procedure: ESOPHAGEAL ENDOSCOPIC ULTRASOUND (EUS) RADIAL;  Surgeon: Arta Silence, MD;  [facility];  Service: Endoscopy;  Laterality: N/A;  . IR GENERIC HISTORICAL  10/30/2015   IR US GUIDE VASC ACCESS RIGHT 10/30/2015 Saverio Danker, PA-C [facility]  . IR GENERIC HISTORICAL  10/30/2015   IR FLUORO GUIDE CV LINE RIGHT 10/30/2015 Saverio Danker, PA-C [facility]  . LUMBAR WOUND DEBRIDEMENT N/A 10/01/2015   Procedure: LUMBAR WOUND DEBRIDEMENT;  Surgeon: Melina Schools, MD;  Location: [place];  Service: Orthopedics;  Laterality: N/A;  . LUMBAR WOUND DEBRIDEMENT N/A 10/15/2015   Procedure: [facility] OUT AND CLOSURE OF BACK WOUND;  Surgeon: Melina Schools, MD;  Location: [place];  Service: Orthopedics;  Laterality: N/A;  . MASS EXCISION Left 07/24/2015   Procedure: EXCISION MASS LEFT LONG FINGER;  Surgeon: Leanora Cover, MD;  Location: [place];  Service: Orthopedics;  Laterality: Left;  . SPINAL FUSION N/A 09/17/2015   Procedure: FUSION POSTERIOR SPINAL MULTILEVEL L4- S1;  Surgeon: Melina Schools, MD;  Location: [place];  Service: Orthopedics;  Laterality: N/A;  . TRANSFORAMINAL LUMBAR INTERBODY FUSION (TLIF) WITH PEDICLE SCREW FIXATION 1 LEVEL N/A 09/17/2015   Procedure: TRANSFORAMINAL LUMBAR INTERBODY FUSION (TLIF) WITH PEDICLE SCREW FIXATION 1 LEVEL , Lumbar 4-5;  Surgeon: Melina Schools, MD;  Location: [place];  Service: Orthopedics;  Laterality: N/A;   Social History   Occupational History  . Not on file.   Social History Main Topics  . Smoking status: Never Smoker  . Smokeless tobacco: Never Used  .  Alcohol use No     Comment: occasional wine  . Drug use: No  . Sexual activity: Not Currently

## 2016-05-13 ENCOUNTER — Ambulatory Visit (INDEPENDENT_AMBULATORY_CARE_PROVIDER_SITE_OTHER): Payer: Medicare Other | Admitting: Orthopedic Surgery

## 2016-05-13 ENCOUNTER — Encounter (INDEPENDENT_AMBULATORY_CARE_PROVIDER_SITE_OTHER): Payer: Self-pay

## 2016-05-13 ENCOUNTER — Encounter (INDEPENDENT_AMBULATORY_CARE_PROVIDER_SITE_OTHER): Payer: Self-pay | Admitting: Orthopedic Surgery

## 2016-05-13 DIAGNOSIS — M170 Bilateral primary osteoarthritis of knee: Secondary | ICD-10-CM

## 2016-05-13 NOTE — Progress Notes (Signed)
Office Visit Note   Patient: Nancy [name]           Date of Birth: 01/13/1940           MRN: [id number] Visit Date: 05/13/2016 Requested by: Antony Blackbird, MD [phone], [place] [address] PCP: Antony Blackbird, MD  Subjective: Chief Complaint  Patient presents with  . Right Knee - Pain  . Left Knee - Pain    HPI: Keiera is a 76 year old patient with bilateral knee pain.  She wants to discuss surgery for her knees.  She states that one knee is not worse than the other.  She describes constant pain in the knees.  She denies any swelling but she does report some weakness and giving way.  No real mechanical symptoms but a lot of weightbearing pain.  The pain does wake her from sleep at night.  Having to start walking with a walker because her legs feel weak.  She doesn't have any stairs at home.  She is seeing a rheumatologist.  Topical cream has helped her knees.  She is currently off antibiotics for a back infection that she had this part of her significant spinal stenosis surgery last year.  She is currently in physical therapy for her back which is going well.  She has 2 more weeks of physical therapy for her back.  While she is there and they have also been working on her knees.  She is here with her daughter.              ROS: All systems reviewed are negative as they relate to the chief complaint within the history of present illness.  Patient denies  fevers or chills.   Assessment & Plan: Visit Diagnoses:  1. Primary osteoarthritis of both knees     Plan: Impression is endstage bilateral knee arthritis with some improvement in range of motion as a result of physical therapy.  Patient would like to get her knees addressed.  She wants to know if she can wait until September and I told her that the decision to have knee replacements completely hers and we will do it whenever she wants to.  I think she is a reasonable candidate for a replacement.  Currently he is off  antibiotics and no evidence typically of any type of residual infection in her back.  That would be a small increased risk with knee replacement.  I'll see her back in August and we can discuss this further.  In the meantime and to put her in physical therapy 1 time every 3 weeks just to monitor her progress in terms of strengthening and range of motion.  If she does decide on knee replacement and I don't want to use all of her therapy visits in the preoperative time frame.  Follow-Up Instructions: Return in about 3 months (around 08/11/2016).   Orders:  No orders of the defined types were placed in this encounter.  No orders of the defined types were placed in this encounter.     Procedures: No procedures performed   Clinical Data: No additional findings.  Objective: Vital Signs: There were no vitals taken for this visit.  Physical Exam:   Constitutional: Patient appears well-developed HEENT:  Head: Normocephalic Eyes:EOM are normal Neck: Normal range of motion Cardiovascular: Normal rate Pulmonary/chest: Effort normal Neurologic: Patient is alert Skin: Skin is warm Psychiatric: Patient has normal mood and affect    Ortho Exam: Orthopedic exam demonstrates that the patient  walks with a walker.  She has palpable pedal pulses.  At a 5 flexion contracture in both knees.  No effusion is present today.  Extensor mechanism is intact.  Flexion is to approximately 110 bilaterally.  There is no groin pain with internal/external rotation of the leg.  No other masses lymph adenopathy or skin changes noted in the knee region.  Extensor mechanism is intact bilaterally  Specialty Comments:  No specialty comments available.  Imaging: No results found.   PMFS History: Patient Active Problem List   Diagnosis Date Noted  . Pseudogout of bilateral knees 03/16/2016  . Osteoarthritis 03/16/2016  . SIRS (systemic inflammatory response syndrome) ([place]) 03/14/2016  . Elevated sed rate  03/14/2016  . Primary osteoarthritis of both knees 01/01/2016  . Postoperative wound infection 12/18/2015  . Difficulty in walking, not elsewhere classified   . Status post lumbar spinal fusion   . Uncontrolled type 2 diabetes mellitus with complication ([place])   . 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 Angioedema   . Hyperlipidemia 10/01/2015  . Gait instability 02/19/2013  . Pulmonary hyperinflation 01/17/2013  . Diastolic dysfunction   . Essential hypertension, benign 01/03/2013   Past Medical History:  Diagnosis Date  . Allergy   . Breast cyst    left breast  . Chest pain   . Complication of anesthesia 1986   slow to awaken after cholecystectomy  . Diastolic dysfunction    with mild pulmonary HTN by echo 07/2011  . Gastric ulcer   . GERD (gastroesophageal reflux disease)   . Gout   . Hyperlipidemia   . Hypertension   . 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 Osteoarthritis   . Osteopenia   . Shortness of breath dyspnea    with activity  . Spine deformity    lower spine  . Type II diabetes mellitus ([facility])     Family History  Problem Relation Age of Onset  . Heart disease Mother   . Cancer Sister     ovarian    Past Surgical History:  Procedure Laterality Date  . ABDOMINAL ADHESION SURGERY    . ABDOMINAL HYSTERECTOMY    . ANUS SURGERY     for torn tissue  . APPENDECTOMY    . BREAST BIOPSY    . BREAST LUMPECTOMY     left  . CHOLECYSTECTOMY  1986  . COLONOSCOPY    . ESOPHAGOGASTRODUODENOSCOPY (EGD) WITH PROPOFOL N/A 07/18/2013   Procedure: ESOPHAGOGASTRODUODENOSCOPY (EGD) WITH PROPOFOL;  Surgeon: Arta Silence, MD;  [facility];  Service: Endoscopy;  Laterality: N/A;  . EUS N/A 07/18/2013   Procedure: ESOPHAGEAL ENDOSCOPIC ULTRASOUND (EUS) RADIAL;  Surgeon: Arta Silence, MD;  [facility];  Service: Endoscopy;  Laterality: N/A;  . IR GENERIC HISTORICAL  10/30/2015   IR US GUIDE VASC ACCESS RIGHT 10/30/2015 Saverio Danker,  PA-C [facility]  . IR GENERIC HISTORICAL  10/30/2015   IR FLUORO GUIDE CV LINE RIGHT 10/30/2015 Saverio Danker, PA-C [facility]  . LUMBAR WOUND DEBRIDEMENT N/A 10/01/2015   Procedure: LUMBAR WOUND DEBRIDEMENT;  Surgeon: Melina Schools, MD;  Location: [place];  Service: Orthopedics;  Laterality: N/A;  . LUMBAR WOUND DEBRIDEMENT N/A 10/15/2015   Procedure: [facility] OUT AND CLOSURE OF BACK WOUND;  Surgeon: Melina Schools, MD;  Location: [place];  Service: Orthopedics;  Laterality: N/A;  . MASS EXCISION Left 07/24/2015   Procedure: EXCISION MASS LEFT LONG FINGER;  Surgeon: Leanora Cover, MD;  Location: [facility];  Service: Orthopedics;  Laterality: Left;  . SPINAL FUSION N/A 09/17/2015   Procedure: FUSION POSTERIOR SPINAL MULTILEVEL L4- S1;  Surgeon: Melina Schools, MD;  Location: [place];  Service: Orthopedics;  Laterality: N/A;  . TRANSFORAMINAL LUMBAR INTERBODY FUSION (TLIF) WITH PEDICLE SCREW FIXATION 1 LEVEL N/A 09/17/2015   Procedure: TRANSFORAMINAL LUMBAR INTERBODY FUSION (TLIF) WITH PEDICLE SCREW FIXATION 1 LEVEL , Lumbar 4-5;  Surgeon: Melina Schools, MD;  Location: [place];  Service: Orthopedics;  Laterality: N/A;   Social History   Occupational History  . Not on file.   Social History Main Topics  . Smoking status: Never Smoker  . Smokeless tobacco: Never Used  . Alcohol use No     Comment: occasional wine  . Drug use: No  . Sexual activity: Not Currently

## 2016-05-27 ENCOUNTER — Encounter: Payer: Self-pay | Admitting: Podiatry

## 2016-05-27 ENCOUNTER — Ambulatory Visit (INDEPENDENT_AMBULATORY_CARE_PROVIDER_SITE_OTHER): Payer: Medicare Other

## 2016-05-27 ENCOUNTER — Ambulatory Visit (INDEPENDENT_AMBULATORY_CARE_PROVIDER_SITE_OTHER): Payer: Medicare Other | Admitting: Podiatry

## 2016-05-27 DIAGNOSIS — R609 Edema, unspecified: Secondary | ICD-10-CM | POA: Diagnosis not present

## 2016-05-27 DIAGNOSIS — M84374A Stress fracture, right foot, initial encounter for fracture: Secondary | ICD-10-CM | POA: Diagnosis not present

## 2016-05-27 DIAGNOSIS — M7741 Metatarsalgia, right foot: Secondary | ICD-10-CM

## 2016-05-27 DIAGNOSIS — M7742 Metatarsalgia, left foot: Secondary | ICD-10-CM | POA: Diagnosis not present

## 2016-05-27 DIAGNOSIS — M722 Plantar fascial fibromatosis: Secondary | ICD-10-CM | POA: Diagnosis not present

## 2016-05-27 NOTE — Patient Instructions (Signed)
For toenail fungus you can try FUNGI-NAIL

## 2016-05-30 NOTE — Progress Notes (Signed)
Subjective: 76 year old female presents the office today for concerns of bilateral foot pain with a right side worse than left. She says majority pain is on the top of her right foot which has been on off for the last 5 months but does become more significant over the last little bit to the right foot. She has some symptoms the left foot but not as significant as the right side. She denies any recent injury or trauma or any change or increase in activity. She said that she has noticed a swelling to the right but denies any redness or warmth. Denies any systemic complaints such as fevers, chills, nausea, vomiting. No acute changes since last appointment, and no other complaints at this time.   Objective: AAO x3, NAD DP/PT pulses palpable bilaterally, CRT less than 3 seconds This decrease in medial arch upon weightbearing. There is tenderness sharply on the second and third metatarsal diaphysis and the right foot and there is localized edema to this area. There is no erythema or increase in warmth. Is no open sores identified. There is no pain to the ankle or other areas of the foot. On the left side there is minimal discomfort in the ball of the foot as well as the right side diffusely submetatarsal 1 through 5 however there is no specific area pinpoint bony tenderness in the left foot. There is no edema or erythema to the left foot. Nails debrided be somewhat discolored with yellow to brown discoloration mild hypertrophic. There is no pain or surrounding redness or drainage. No open lesions or pre-ulcerative lesions. There is no pain with calf compression, swelling, warmth, erythema.  Assessment: Right foot pain, possible stress fracture given localized pinpoint pain as well as swelling; metatarsalgia; onychomycosis  Plan: -Treatment options discussed including all alternatives, risks, and complications -Etiology of symptoms were discussed -X-rays were obtained and reviewed with the patient.  Arthritic changes are present there is no evidence of acute fracture or stress fracture identified state. -Given the pain as well as a right foot is no concern for possible future stress fracture. I did immobilizer and a surgical shoe today. Also compression sock anklet. -Metatarsal offloading pads b/l -Shift secondary concerns today of nail fungus. Discussed sponge nail over-the-counter. -I want to see her back in 2 weeks. At that point if her symptoms improved and recommended her to return to regular shoe and I discussed with her and orthotic in her shoe to help support her foot and was this will help her foot pain as well.  Celesta Gentile, DPM

## 2016-06-16 ENCOUNTER — Ambulatory Visit: Payer: Medicare Other | Admitting: Podiatry

## 2016-06-17 ENCOUNTER — Ambulatory Visit (INDEPENDENT_AMBULATORY_CARE_PROVIDER_SITE_OTHER): Payer: Medicare Other

## 2016-06-17 ENCOUNTER — Encounter: Payer: Self-pay | Admitting: Podiatry

## 2016-06-17 ENCOUNTER — Ambulatory Visit (INDEPENDENT_AMBULATORY_CARE_PROVIDER_SITE_OTHER): Payer: Medicare Other | Admitting: Podiatry

## 2016-06-17 DIAGNOSIS — M7742 Metatarsalgia, left foot: Secondary | ICD-10-CM

## 2016-06-17 DIAGNOSIS — M84371D Stress fracture, right ankle, subsequent encounter for fracture with routine healing: Secondary | ICD-10-CM

## 2016-06-17 DIAGNOSIS — M7740 Metatarsalgia, unspecified foot: Secondary | ICD-10-CM

## 2016-06-17 DIAGNOSIS — M84374A Stress fracture, right foot, initial encounter for fracture: Secondary | ICD-10-CM

## 2016-06-17 DIAGNOSIS — M7741 Metatarsalgia, right foot: Secondary | ICD-10-CM | POA: Diagnosis not present

## 2016-06-17 MED ORDER — DICLOFENAC SODIUM 1 % TD GEL: 2.0000 g | Freq: Four times a day (QID) | TRANSDERMAL | 2 refills | Status: DC

## 2016-06-20 NOTE — Progress Notes (Signed)
Subjective: 76 year old female presents the office today for follow-up evaluation of bilateral foot pain with the right much worse than the left. She states that she's had some minimal improvement since last appointment. She has remained the surgical shoe. She says the left side does hurt her but not a status the right side. She denies any recent injury or trauma. Overall she feels that she is about the same last appointment she has no new concerns or complaints of numbness change. Denies any systemic complaints such as fevers, chills, nausea, vomiting. No acute changes since last appointment, and no other complaints at this time.   Objective: AAO x3, NAD DP/PT pulses palpable bilaterally, CRT less than 3 seconds Decrease in medial arch height upon weightbearing. There is tenderness along the first interspace and along the second and third metatarsals. There is mild edema along the first interspace and the right foot but she states this has been ongoing for some time. Her symptoms are worse when walking after hearing this time. The pain is intermittent. There is no erythema or increase in warmth. No open lesions or pre-ulcerative lesions.  No pain with calf compression, swelling, warmth, erythema  Assessment: Bilateral foot pain right side worse than left; rule out stress fracture  Plan: -All treatment options discussed with the patient including all alternatives, risks, complications.  -Repeated x-rays were obtained and reviewed. No evidence of acute fracture or stress fracture. Because of this I believe that her pain is more likely due to biomechanical changes. Also given the longevity her symptoms stress fracture do not think is likely today given repeat x-rays. I did dispense power steps to her today to help. Also discussed with her how to wear this in gradual break-in. Metatarsal pads were dispensed and applied to the power steps. Discussed the change in shoe gear as well. If she has any  increasing pain returned a surgical shoe and call the office. -Patient encouraged to call the office with any questions, concerns, change in symptoms.    Nancy [name], DPM

## 2016-06-23 ENCOUNTER — Telehealth (INDEPENDENT_AMBULATORY_CARE_PROVIDER_SITE_OTHER): Payer: Self-pay | Admitting: Orthopedic Surgery

## 2016-06-23 NOTE — Telephone Encounter (Signed)
Patient called asked if Dr Marlou Sa can prescribe her something for the pain in her knees. The number to contact patient is [phone]

## 2016-06-23 NOTE — Telephone Encounter (Signed)
Please advise. Thanks.  

## 2016-06-24 MED ORDER — TRAMADOL HCL 50 MG PO TABS
50.0000 mg | ORAL_TABLET | Freq: Two times a day (BID) | ORAL | 0 refills | Status: DC | PRN
Start: 2016-06-24 — End: 2016-10-07

## 2016-06-24 NOTE — Telephone Encounter (Signed)
rx called into pharmacy. Patient advised done.

## 2016-06-24 NOTE — Telephone Encounter (Signed)
Ok for ultram 1 po q 12 # 50

## 2016-06-24 NOTE — Addendum Note (Signed)
Addended byLaurann Montana on: 06/24/2016 10:50 AM   Modules accepted: Orders

## 2016-07-19 ENCOUNTER — Ambulatory Visit: Payer: Medicare Other | Admitting: Podiatry

## 2016-08-02 ENCOUNTER — Ambulatory Visit: Payer: Medicare Other | Admitting: Podiatry

## 2016-08-13 ENCOUNTER — Ambulatory Visit (INDEPENDENT_AMBULATORY_CARE_PROVIDER_SITE_OTHER): Payer: Medicare Other | Admitting: Podiatry

## 2016-08-13 DIAGNOSIS — B351 Tinea unguium: Secondary | ICD-10-CM | POA: Diagnosis not present

## 2016-08-13 DIAGNOSIS — M79674 Pain in right toe(s): Secondary | ICD-10-CM

## 2016-08-13 DIAGNOSIS — L600 Ingrowing nail: Secondary | ICD-10-CM

## 2016-08-13 DIAGNOSIS — M79675 Pain in left toe(s): Secondary | ICD-10-CM

## 2016-08-13 DIAGNOSIS — M779 Enthesopathy, unspecified: Secondary | ICD-10-CM | POA: Diagnosis not present

## 2016-08-16 NOTE — Progress Notes (Signed)
Subjective: 76 year old female presents the also concerns of bilateral foot pain. She states that she is doing much better compared to last appointment she is not getting sharp pains that she used to. Her pain is still intermittent but overall doing much better. She states that she try to wear the insert in her shoes but they're too bulky. She denies any numbness or tingling. Her main concern today is her nails are thick elongated and she cannot trim them herself instrumented ingrown. Denies any redness or drainage along the toenail sites. Denies any systemic complaints such as fevers, chills, nausea, vomiting. No acute changes since last appointment, and no other complaints at this time.   Objective: AAO x3, NAD DP/PT pulses palpable bilaterally, CRT less than 3 seconds She'll continue to be some discomfort on the right first interspace and there is trace edema to this area but is no erythema or increase in warmth. There is no area pinpoint bony tenderness or pain vibratory sensation. The patient in the metatarsals has resolved. There is a decrease in medial arch height. Nails are hypertrophic, dystrophic, brittle, discolored, elongated 10.Nails appear to be ingrown on the majority of them. No surrounding redness or drainage. Tenderness nails 1-5 bilaterally. No open lesions or pre-ulcerative lesions are identified today. No pain with calf compression, swelling, warmth, erythema  Assessment: 76 year old female with resolving follow-up for pain still some discomfort in the right foot likely tendinitis. Symptomatic onychomycosis.  Plan: -All treatment options discussed with the patient including all alternatives, risks, complications.  -Regards to the foot pain. I discussed steroid injection but she wishes to hold off today as her symptoms are improved. Discussed with her continue supportive shoe gear. Elevation gradually increase return to activity. -Nails sharply debrided 10 without complications  or bleeding. -Patient encouraged to call the office with any questions, concerns, change in symptoms.   Celesta Gentile, DPM

## 2016-08-18 NOTE — Telephone Encounter (Signed)
Per Dr. Randel Pigg note.  She will be seen for repeat injections as needed

## 2016-09-22 ENCOUNTER — Ambulatory Visit (INDEPENDENT_AMBULATORY_CARE_PROVIDER_SITE_OTHER): Payer: Medicare Other | Admitting: Orthopedic Surgery

## 2016-09-24 ENCOUNTER — Encounter: Payer: Self-pay | Admitting: Cardiology

## 2016-10-01 ENCOUNTER — Encounter (INDEPENDENT_AMBULATORY_CARE_PROVIDER_SITE_OTHER): Payer: Self-pay | Admitting: Orthopedic Surgery

## 2016-10-01 ENCOUNTER — Ambulatory Visit (INDEPENDENT_AMBULATORY_CARE_PROVIDER_SITE_OTHER): Payer: Medicare Other | Admitting: Orthopedic Surgery

## 2016-10-01 DIAGNOSIS — M170 Bilateral primary osteoarthritis of knee: Secondary | ICD-10-CM | POA: Diagnosis not present

## 2016-10-01 MED ORDER — TRAMADOL HCL 50 MG PO TABS: 50.0000 mg | ORAL_TABLET | Freq: Two times a day (BID) | ORAL | 0 refills | Status: DC | PRN

## 2016-10-01 NOTE — Progress Notes (Signed)
Office Visit Note   Patient: Nancy [name]           Date of Birth: [birth year]           MRN: [id number] Visit Date: 10/01/2016 Requested by: Antony Blackbird, MD [phone], [place] [address] PCP: Dianna Rossetti, NP  Subjective: Chief Complaint  Patient presents with  . Left Knee - Pain  . Right Knee - Pain    HPI: Nancy [name] 76 year old patient with bilateral knee pain right worse than left.  At te pain is worse than others.  She balance issues.  She has tried physical therapy with some relief.  The patient will wake from sleep at night with pain.  She reports some back mediated Symptoms.  She is concerned that her knee pain is causing balance issues.  She describes waking from sleep at night with pain as well as numbness and tingling in the legs.  She describes some weakness and giving way.  She had bilatel gel injections in February of this year.  She had a hospitalization shortly after that which may or may not be related to the gel injections.              ROS: All systems reviewed are negative as they relate to the chief complaint within the history of present illness.  Patient denies  fevers or chills.   Assessment & Plan: Visit Diagnoses:  1. Primary osteoarthritis of both knees     Plan: impression is bilateral knee pain.  Patient has end-stage knee arthritis in both knees.  I think she should consider knee replacement at a time that is convenient for her.  She is going to the [facility] and working out to get her leg stronger which I think is important.  She may consider knee replacement at the beginning of the year.  I'll see her back as needed.  We could consider cortisone injections prior to that hen her symptoms warrant the intervention.  Currently she is relatively asymptomatic with her knees compared to where she has been in the past.  Follow-Up Instructions: Return if symptoms worsen or fail to improve.   Orders:  No orders of the defined types were placed in  this encounter.  Meds ordered this encounter  Medications  . traMADol (ULTRAM) 50 MG tablet    Sig: Take 1 tablet (50 mg total) by mouth every 12 (twelve) hours as needed.    Dispense:  60 tablet    Refill:  0      Procedures: No procedures performed   Clinical Data: No additional findings.  Objective: Vital Signs: There were no vitals taken for this visit.  Physical Exam:   Constitutional: Patient appears well-developed HEENT:  Head: Normocephalic Eyes:EOM are normal Neck: Normal range of motion Cardiovascular: Normal rate Pulmonary/chest: Effort normal Neurologic: Patient is alert Skin: Skin is warm Psychiatric: Patient has normal mood and affect    Ortho Exam: orthopedic exam demonstrates mild patellofemoral crepitus and no effusion in either knee flexion contracture in the left knee of about 10 right knee is straight.  Extensor mechanism is intact.  No nerve retention signs.  Pedal pulses palpable.  She has diffuse joint line tenderness in both knees.  No groin pain with internal/external rotation of the leg.  Specialty Comments:  No specialty comments available.  Imaging: No results found.   PMFS History: Patient Active Problem List   Diagnosis Date Noted  . Pseudogout of bilateral knees 03/16/2016  . Osteoarthritis  03/16/2016  . SIRS (systemic inflammatory response syndrome) ([place]) 03/14/2016  . Elevated sed rate 03/14/2016  . Primary osteoarthritis of both knees 01/01/2016  . Postoperative wound infection 12/18/2015  . Difficulty in walking, not elsewhere classified   . Status post lumbar spinal fusion   . Uncontrolled type 2 diabetes mellitus with complication ([place])   . 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 Angioedema   . Hyperlipidemia 10/01/2015  . Gait instability 02/19/2013  . Pulmonary hyperinflation 01/17/2013  . Diastolic dysfunction   . Essential hypertension, benign 01/03/2013   Past Medical History:  Diagnosis Date  .  Allergy   . Breast cyst    left breast  . Chest pain   . Complication of anesthesia 1986   slow to awaken after cholecystectomy  . Diastolic dysfunction    with mild pulmonary HTN by echo 07/2011  . Gastric ulcer   . GERD (gastroesophageal reflux disease)   . Gout   . Hyperlipidemia   . Hypertension   . 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 Osteoarthritis   . Osteopenia   . Shortness of breath dyspnea    with activity  . Spine deformity    lower spine  . Type II diabetes mellitus (HCC)     Family History  Problem Relation Age of Onset  . Heart disease Mother   . Cancer Sister        ovarian    Past Surgical History:  Procedure Laterality Date  . ABDOMINAL ADHESION SURGERY    . ABDOMINAL HYSTERECTOMY    . ANUS SURGERY     for torn tissue  . APPENDECTOMY    . BREAST BIOPSY    . BREAST LUMPECTOMY     left  . CHOLECYSTECTOMY  1986  . COLONOSCOPY    . ESOPHAGOGASTRODUODENOSCOPY (EGD) WITH PROPOFOL N/A 07/18/2013   Procedure: ESOPHAGOGASTRODUODENOSCOPY (EGD) WITH PROPOFOL;  Surgeon: Arta Silence, MD;  Location: [facility];  Service: Endoscopy;  Laterality: N/A;  . EUS N/A 07/18/2013   Procedure: ESOPHAGEAL ENDOSCOPIC ULTRASOUND (EUS) RADIAL;  Surgeon: Arta Silence, MD;  Location: [facility];  Service: Endoscopy;  Laterality: N/A;  . IR GENERIC HISTORICAL  10/30/2015   IR US GUIDE VASC ACCESS RIGHT 10/30/2015 Saverio Danker, PA-C [facility]  . IR GENERIC HISTORICAL  10/30/2015   IR FLUORO GUIDE CV LINE RIGHT 10/30/2015 Saverio Danker, PA-C [facility]  . LUMBAR WOUND DEBRIDEMENT N/A 10/01/2015   Procedure: LUMBAR WOUND DEBRIDEMENT;  Surgeon: Melina Schools, MD;  Location: [place];  Service: Orthopedics;  Laterality: N/A;  . LUMBAR WOUND DEBRIDEMENT N/A 10/15/2015   Procedure: [facility] OUT AND CLOSURE OF BACK WOUND;  Surgeon: Melina Schools, MD;  Location: [place];  Service: Orthopedics;  Laterality: N/A;  . MASS EXCISION Left 07/24/2015   Procedure: EXCISION MASS LEFT  LONG FINGER;  Surgeon: Leanora Cover, MD;  Location: [place];  Service: Orthopedics;  Laterality: Left;  . SPINAL FUSION N/A 09/17/2015   Procedure: FUSION POSTERIOR SPINAL MULTILEVEL L4- S1;  Surgeon: Melina Schools, MD;  Location: [place];  Service: Orthopedics;  Laterality: N/A;  . TRANSFORAMINAL LUMBAR INTERBODY FUSION (TLIF) WITH PEDICLE SCREW FIXATION 1 LEVEL N/A 09/17/2015   Procedure: TRANSFORAMINAL LUMBAR INTERBODY FUSION (TLIF) WITH PEDICLE SCREW FIXATION 1 LEVEL , Lumbar 4-5;  Surgeon: Melina Schools, MD;  Location: [place];  Service: Orthopedics;  Laterality: N/A;   Social History   Occupational History  . Not on file.   Social History Main  Topics  . Smoking status: Never Smoker  . Smokeless tobacco: Never Used  . Alcohol use No     Comment: occasional wine  . Drug use: No  . Sexual activity: Not Currently

## 2016-10-07 ENCOUNTER — Encounter: Payer: Self-pay | Admitting: Cardiology

## 2016-10-07 ENCOUNTER — Telehealth (HOSPITAL_COMMUNITY): Payer: Self-pay | Admitting: *Deleted

## 2016-10-07 ENCOUNTER — Ambulatory Visit (INDEPENDENT_AMBULATORY_CARE_PROVIDER_SITE_OTHER): Payer: Medicare Other | Admitting: Cardiology

## 2016-10-07 VITALS — BP 142/82 | HR 77 | Ht 60.0 in | Wt 184.6 lb

## 2016-10-07 DIAGNOSIS — R0602 Shortness of breath: Secondary | ICD-10-CM

## 2016-10-07 DIAGNOSIS — R079 Chest pain, unspecified: Secondary | ICD-10-CM | POA: Diagnosis not present

## 2016-10-07 DIAGNOSIS — I10 Essential (primary) hypertension: Secondary | ICD-10-CM | POA: Diagnosis not present

## 2016-10-07 NOTE — Progress Notes (Signed)
Cardiology Office Note:    Date:  10/07/2016   ID:  Nancy [name], DOB [birth year], MRN [id number]  PCP:  Dianna Rossetti, NP  Cardiologist:  Fransico Him, MD   Referring MD: Antony Blackbird, MD   Chief Complaint  Patient presents with  . Follow-up    Essential hypertension, benign    History of Present Illness:    Nancy [name] is a 76 y.o. female with a hx of HTN and diastolic dysfunction.  She is here today for followup and is doing well.  She denies any  PND, orthopnea, LE edema, dizziness except for vertigo when she lays down, palpitations or syncope. She has a lot of back problems and when she gets the back pain she will feel a discomfort in her chest that is dull and worse with movement.  If she does not move then the pain gets better.  She occasionally gets some discomfort in her chest with exertion and tires more than she used to.  She has chronic DOE that she thinks is new as well.  She is compliant with her meds and is tolerating meds with no SE.    Past Medical History:  Diagnosis Date  . Allergy   . Breast cyst    left breast  . Chest pain   . Complication of anesthesia 1986   slow to awaken after cholecystectomy  . Diastolic dysfunction    with mild pulmonary HTN by echo 07/2011  . Gastric ulcer   . GERD (gastroesophageal reflux disease)   . Gout   . Hyperlipidemia   . Hypertension   . 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 Osteoarthritis   . Osteopenia   . Shortness of breath dyspnea    with activity  . Spine deformity    lower spine  . Type II diabetes mellitus ([place])     Past Surgical History:  Procedure Laterality Date  . ABDOMINAL ADHESION SURGERY    . ABDOMINAL HYSTERECTOMY    . ANUS SURGERY     for torn tissue  . APPENDECTOMY    . BREAST BIOPSY    . BREAST LUMPECTOMY     left  . CHOLECYSTECTOMY  1986  . COLONOSCOPY    . ESOPHAGOGASTRODUODENOSCOPY (EGD) WITH PROPOFOL N/A 07/18/2013   Procedure: ESOPHAGOGASTRODUODENOSCOPY (EGD) WITH  PROPOFOL;  Surgeon: Arta Silence, MD;  [facility];  Service: Endoscopy;  Laterality: N/A;  . EUS N/A 07/18/2013   Procedure: ESOPHAGEAL ENDOSCOPIC ULTRASOUND (EUS) RADIAL;  Surgeon: Arta Silence, MD;  [facility];  Service: Endoscopy;  Laterality: N/A;  . IR GENERIC HISTORICAL  10/30/2015   IR US GUIDE VASC ACCESS RIGHT 10/30/2015 Saverio Danker, PA-C [facility]  . IR GENERIC HISTORICAL  10/30/2015   IR FLUORO GUIDE CV LINE RIGHT 10/30/2015 Saverio Danker, PA-C [facility]  . LUMBAR WOUND DEBRIDEMENT N/A 10/01/2015   Procedure: LUMBAR WOUND DEBRIDEMENT;  Surgeon: Melina Schools, MD;  Location: [place];  Service: Orthopedics;  Laterality: N/A;  . LUMBAR WOUND DEBRIDEMENT N/A 10/15/2015   Procedure: [facility] OUT AND CLOSURE OF BACK WOUND;  Surgeon: Melina Schools, MD;  Location: [place];  Service: Orthopedics;  Laterality: N/A;  . MASS EXCISION Left 07/24/2015   Procedure: EXCISION MASS LEFT LONG FINGER;  Surgeon: Leanora Cover, MD;  Location: [place];  Service: Orthopedics;  Laterality: Left;  . SPINAL FUSION N/A 09/17/2015   Procedure: FUSION POSTERIOR SPINAL MULTILEVEL L4- S1;  Surgeon: Melina Schools, MD;  [facility];  Service: Orthopedics;  Laterality: N/A;  . TRANSFORAMINAL LUMBAR INTERBODY FUSION (TLIF) WITH PEDICLE SCREW FIXATION 1 LEVEL N/A 09/17/2015   Procedure: TRANSFORAMINAL LUMBAR INTERBODY FUSION (TLIF) WITH PEDICLE SCREW FIXATION 1 LEVEL , Lumbar 4-5;  Surgeon: Melina Schools, MD;  Location: [place];  Service: Orthopedics;  Laterality: N/A;    Current Medications: Current Meds  Medication Sig  . allopurinol (ZYLOPRIM) 100 MG tablet Take 200 mg by mouth daily.   [name] amLODipine (NORVASC) 10 MG tablet Take 10 mg by mouth every morning.   [name] aspirin EC 81 MG tablet Take 81 mg by mouth daily.  . colchicine (COLCRYS) 0.6 MG tablet Take 0.6 mg by mouth daily as needed (Gout).   . [employer] GENTLE LAXATIVE 5 MG EC tablet See admin instructions.  . diclofenac sodium  (VOLTAREN) 1 % GEL Apply 2 g topically 4 (four) times daily as needed (tender joints).  . DUREZOL 0.05 % EMUL   . furosemide (LASIX) 20 MG tablet Take 1 tablet (20 mg total) by mouth daily.  [name] gabapentin (NEURONTIN) 300 MG capsule Take 600 mg by mouth 3 (three) times daily.   [name] GAVILYTE-N WITH FLAVOR PACK 420 g solution Take 1 Package by mouth as directed.  [name] glipiZIDE (GLUCOTROL) 5 MG tablet Take 5 mg by mouth daily before breakfast.  . losartan (COZAAR) 50 MG tablet Take 50 mg by mouth daily.  . metFORMIN (GLUCOPHAGE) 1000 MG tablet Take 1,000 mg by mouth 2 (two) times daily with a meal.    . metoprolol (LOPRESSOR) 100 MG tablet Take 100 mg by mouth every morning.   . ondansetron (ZOFRAN) 4 MG tablet Take 4 mg by mouth every 8 (eight) hours as needed for nausea or vomiting.  . simvastatin (ZOCOR) 20 MG tablet Take 20 mg by mouth every evening.   . traMADol (ULTRAM) 50 MG tablet Take 1 tablet (50 mg total) by mouth every 12 (twelve) hours as needed.     Allergies:   Morphine and related; Toradol [ketorolac tromethamine]; and Codeine   Social History   Social History  . Marital status: Divorced    Spouse name: N/A  . Number of children: 4  . Years of education: 80   Social History Main Topics  . Smoking status: Never Smoker  . Smokeless tobacco: Never Used  . Alcohol use No     Comment: occasional wine  . Drug use: No  . Sexual activity: Not Currently   Other Topics Concern  . None   Social History Narrative   Patient is single, has 3 children living 1 deceased   Patient is right handed   Education level is 12   Caffeine consumption is 0     Family History: The patient's family history includes Cancer in her sister; Heart disease in her mother.  ROS:   Please see the history of present illness.    ROS  All other systems reviewed and negative.   EKGs/Labs/Other Studies Reviewed:    The following studies were reviewed today: none  EKG:  EKG is not ordered today.      Recent Labs: 03/13/2016: ALT 13 03/14/2016: Magnesium 1.4 03/16/2016: BUN 25; Creatinine, Ser 0.78; Hemoglobin 9.4; Platelets 355; Potassium 3.7; Sodium 137   Recent Lipid Panel No results found for: CHOL, TRIG, HDL, CHOLHDL, VLDL, LDLCALC, LDLDIRECT  Physical Exam:    VS:  BP (!) 142/82   Pulse 77   Ht 5' (1.524 m)   Wt 184 lb 9.6 oz (83.7  kg)   SpO2 93%   BMI 36.05 kg/m     Wt Readings from Last 3 Encounters:  10/07/16 184 lb 9.6 oz (83.7 kg)  03/13/16 180 lb 12.4 oz (82 kg)  12/18/15 175 lb (79.4 kg)     GEN:  Well nourished, well developed in no acute distress HEENT: Normal NECK: No JVD; No carotid bruits LYMPHATICS: No lymphadenopathy CARDIAC: RRR, no murmurs, rubs, gallops RESPIRATORY:  Clear to auscultation without rales, wheezing or rhonchi  ABDOMEN: Soft, non-tender, non-distended MUSCULOSKELETAL:  No edema; No deformity  SKIN: Warm and dry NEUROLOGIC:  Alert and oriented x 3 PSYCHIATRIC:  Normal affect   ASSESSMENT:    1. Essential hypertension, benign   2. SOB (shortness of breath)   3. Chest pain, unspecified type    PLAN:    In order of problems listed above:  1.  HTN - BP well controlled on exam today. She will continue on amlodipine 10mg  daily, metoprolol 100mg  daily and Losartan 50mg  daily.    2.  Diastolic dysfunction/SOB - could be etiology of her SOB.   3.  Chest discomfort - with typical and atypical symptoms.  Likely her symptoms are from her back but she is now having DOE which is new for her and exertional fatigue.  I will get a lexiscan myoview to rule out ischemia.     Medication Adjustments/Labs and Tests Ordered: Current medicines are reviewed at length with the patient today.  Concerns regarding medicines are outlined above.  No orders of the defined types were placed in this encounter.  No orders of the defined types were placed in this encounter.   Signed, Fransico Him, MD  10/07/2016 11:10 AM    [place]

## 2016-10-07 NOTE — Telephone Encounter (Signed)
Patient given detailed instructions per Myocardial Perfusion Study Information Sheet for the test on 10/11/16. Patient notified to arrive 15 minutes early and that it is imperative to arrive on time for appointment to keep from having the test rescheduled.  If you need to cancel or reschedule your appointment, please call the office within 24 hours of your appointment. . Patient verbalized understanding. Nancy [name]    

## 2016-10-07 NOTE — Patient Instructions (Signed)
Medication Instructions:  The current medical regimen is effective;  continue present plan and medications.  Testing/Procedures: Your physician has requested that you have a lexiscan myoview. For further information please visit [email or web address]. Please follow instruction sheet, as given.  Follow-Up: Follow up in 1 year with Dr. Radford Pax.  You will receive a letter in the mail 2 months before you are due.  Please call us when you receive this letter to schedule your follow up appointment.  If you need a refill on your cardiac medications before your next appointment, please call your pharmacy.  Thank you for choosing [facility]!!

## 2016-10-08 ENCOUNTER — Telehealth: Payer: Self-pay | Admitting: Cardiology

## 2016-10-08 NOTE — Telephone Encounter (Signed)
Nancy [name] is calling to give the correct medication. She is taking Metoprolol Tartrate 100mg  . Dr.Turner wanted to know whether this was the right medication . Please call if you have any questions

## 2016-10-08 NOTE — Telephone Encounter (Signed)
Patient is taking Metoprolol Tartate 100 mg by mouth daily. Will forward to Dr. Radford Pax to see if she wants to make any changes.

## 2016-10-11 ENCOUNTER — Other Ambulatory Visit: Payer: Self-pay | Admitting: Cardiology

## 2016-10-11 ENCOUNTER — Encounter (HOSPITAL_COMMUNITY): Payer: Medicare Other

## 2016-10-11 DIAGNOSIS — R0602 Shortness of breath: Secondary | ICD-10-CM

## 2016-10-11 DIAGNOSIS — R079 Chest pain, unspecified: Secondary | ICD-10-CM

## 2016-10-11 MED ORDER — METOPROLOL TARTRATE 50 MG PO TABS: 50.0000 mg | ORAL_TABLET | Freq: Two times a day (BID) | ORAL | 3 refills | Status: DC

## 2016-10-11 NOTE — Addendum Note (Signed)
Addended by: Drue Novel I on: 10/11/2016 02:30 PM   Modules accepted: Orders

## 2016-10-11 NOTE — Telephone Encounter (Signed)
Called and made patient aware of Dr. Theodosia Blender recommendations to decrease Lopressor to 50 mg BID and to monitor BP x 1 week and call the office with her results. Patient verbalized understanding and thanked me for the call.

## 2016-10-11 NOTE — Telephone Encounter (Signed)
Please change Lopressor to 50mg  BID and have her check BP daily for a week and call with results

## 2016-10-13 ENCOUNTER — Encounter (HOSPITAL_COMMUNITY): Payer: Medicare Other

## 2016-10-13 ENCOUNTER — Ambulatory Visit (HOSPITAL_COMMUNITY): Payer: Medicare Other | Attending: Cardiology

## 2016-10-13 DIAGNOSIS — R0602 Shortness of breath: Secondary | ICD-10-CM | POA: Diagnosis not present

## 2016-10-13 DIAGNOSIS — R079 Chest pain, unspecified: Secondary | ICD-10-CM | POA: Diagnosis present

## 2016-10-13 LAB — MYOCARDIAL PERFUSION IMAGING
CHL CUP RESTING HR STRESS: 74 {beats}/min
CSEPPHR: 87 {beats}/min
LVDIAVOL: 62 mL (ref 46–106)
LVSYSVOL: 19 mL
RATE: 0.24
SDS: 1
SRS: 6
SSS: 7
TID: 1.07

## 2016-10-13 MED ORDER — TECHNETIUM TC 99M TETROFOSMIN IV KIT
9.6000 | PACK | Freq: Once | INTRAVENOUS | Status: AC | PRN
  Administered 2016-10-13: 9.6 via INTRAVENOUS
  Filled 2016-10-13: qty 10

## 2016-10-13 MED ORDER — REGADENOSON 0.4 MG/5ML IV SOLN
0.4000 mg | Freq: Once | INTRAVENOUS | Status: AC
  Administered 2016-10-13: 0.4 mg via INTRAVENOUS

## 2016-10-13 MED ORDER — TECHNETIUM TC 99M TETROFOSMIN IV KIT
32.7000 | PACK | Freq: Once | INTRAVENOUS | Status: AC | PRN
  Administered 2016-10-13: 32.7 via INTRAVENOUS
  Filled 2016-10-13: qty 33

## 2016-12-07 ENCOUNTER — Other Ambulatory Visit: Payer: Self-pay | Admitting: Nurse Practitioner

## 2016-12-07 DIAGNOSIS — Z1231 Encounter for screening mammogram for malignant neoplasm of breast: Secondary | ICD-10-CM

## 2016-12-23 ENCOUNTER — Ambulatory Visit (INDEPENDENT_AMBULATORY_CARE_PROVIDER_SITE_OTHER): Payer: Medicare Other | Admitting: Podiatry

## 2016-12-23 ENCOUNTER — Encounter: Payer: Self-pay | Admitting: Podiatry

## 2016-12-23 DIAGNOSIS — M79675 Pain in left toe(s): Secondary | ICD-10-CM | POA: Diagnosis not present

## 2016-12-23 DIAGNOSIS — B351 Tinea unguium: Secondary | ICD-10-CM

## 2016-12-23 DIAGNOSIS — G629 Polyneuropathy, unspecified: Secondary | ICD-10-CM

## 2016-12-23 DIAGNOSIS — M79674 Pain in right toe(s): Secondary | ICD-10-CM

## 2016-12-23 NOTE — Patient Instructions (Signed)
Try increasing your gabapentin to 900mg  at night to see if this helps. If you have any issues taking it, please let me know.   Gabapentin capsules or tablets What is this medicine? GABAPENTIN (GA ba pen tin) is used to control partial seizures in adults with epilepsy. It is also used to treat certain types of nerve pain. This medicine may be used for other purposes; ask your health care provider or pharmacist if you have questions. COMMON BRAND NAME(S): Active-PAC with Gabapentin, Gabarone, Neurontin What should I tell my health care provider before I take this medicine? They need to know if you have any of these conditions: -kidney disease -suicidal thoughts, plans, or attempt; a previous suicide attempt by you or a family member -an unusual or allergic reaction to gabapentin, other medicines, foods, dyes, or preservatives -pregnant or trying to get pregnant -breast-feeding How should I use this medicine? Take this medicine by mouth with a glass of water. Follow the directions on the prescription label. You can take it with or without food. If it upsets your stomach, take it with food.Take your medicine at regular intervals. Do not take it more often than directed. Do not stop taking except on your doctor's advice. If you are directed to break the 600 or 800 mg tablets in half as part of your dose, the extra half tablet should be used for the next dose. If you have not used the extra half tablet within 28 days, it should be thrown away. A special MedGuide will be given to you by the pharmacist with each prescription and refill. Be sure to read this information carefully each time. Talk to your pediatrician regarding the use of this medicine in children. Special care may be needed. Overdosage: If you think you have taken too much of this medicine contact a poison control center or emergency room at once. NOTE: This medicine is only for you. Do not share this medicine with others. What if I miss  a dose? If you miss a dose, take it as soon as you can. If it is almost time for your next dose, take only that dose. Do not take double or extra doses. What may interact with this medicine? Do not take this medicine with any of the following medications: -other gabapentin products This medicine may also interact with the following medications: -alcohol -antacids -antihistamines for allergy, cough and cold -certain medicines for anxiety or sleep -certain medicines for depression or psychotic disturbances -homatropine; hydrocodone -naproxen -narcotic medicines (opiates) for pain -phenothiazines like chlorpromazine, mesoridazine, prochlorperazine, thioridazine This list may not describe all possible interactions. Give your health care provider a list of all the medicines, herbs, non-prescription drugs, or dietary supplements you use. Also tell them if you smoke, drink alcohol, or use illegal drugs. Some items may interact with your medicine. What should I watch for while using this medicine? Visit your doctor or health care professional for regular checks on your progress. You may want to keep a record at home of how you feel your condition is responding to treatment. You may want to share this information with your doctor or health care professional at each visit. You should contact your doctor or health care professional if your seizures get worse or if you have any new types of seizures. Do not stop taking this medicine or any of your seizure medicines unless instructed by your doctor or health care professional. Stopping your medicine suddenly can increase your seizures or their severity. Wear a medical  identification bracelet or chain if you are taking this medicine for seizures, and carry a card that lists all your medications. You may get drowsy, dizzy, or have blurred vision. Do not drive, use machinery, or do anything that needs mental alertness until you know how this medicine affects you.  To reduce dizzy or fainting spells, do not sit or stand up quickly, especially if you are an older patient. Alcohol can increase drowsiness and dizziness. Avoid alcoholic drinks. Your mouth may get dry. Chewing sugarless gum or sucking hard candy, and drinking plenty of water will help. The use of this medicine may increase the chance of suicidal thoughts or actions. Pay special attention to how you are responding while on this medicine. Any worsening of mood, or thoughts of suicide or dying should be reported to your health care professional right away. Women who become pregnant while using this medicine may enroll in the [place] Pregnancy Registry by calling [phone]. This registry collects information about the safety of antiepileptic drug use during pregnancy. What side effects may I notice from receiving this medicine? Side effects that you should report to your doctor or health care professional as soon as possible: -allergic reactions like skin rash, itching or hives, swelling of the face, lips, or tongue -worsening of mood, thoughts or actions of suicide or dying Side effects that usually do not require medical attention (report to your doctor or health care professional if they continue or are bothersome): -constipation -difficulty walking or controlling muscle movements -dizziness -nausea -slurred speech -tiredness -tremors -weight gain This list may not describe all possible side effects. Call your doctor for medical advice about side effects. You may report side effects to FDA at 1-800-FDA-1088. Where should I keep my medicine? Keep out of reach of children. This medicine may cause accidental overdose and death if it taken by other adults, children, or pets. Mix any unused medicine with a substance like cat litter or coffee grounds. Then throw the medicine away in a sealed container like a sealed bag or a coffee can with a lid. Do not use the medicine  after the expiration date. Store at room temperature between 15 and 30 degrees C (59 and 86 degrees F). NOTE: This sheet is a summary. It may not cover all possible information. If you have questions about this medicine, talk to your doctor, pharmacist, or health care provider.  2018 Elsevier/Gold Standard (2013-02-23 15:26:50)

## 2016-12-28 NOTE — Progress Notes (Signed)
Subjective: 76 y.o. returns the office today for painful, elongated, thickened toenails which she cannot trim herself. Denies any redness or drainage around the nails.  She also states that she still getting pain to both of her feet.  She describes the pain as a burning pain is to the top of her foot, the ball of the foot as well as the heel and is on both feet.  She is currently on gabapentin for neuropathy.  Denies any acute changes since last appointment and no new complaints today. Denies any systemic complaints such as fevers, chills, nausea, vomiting.   PCP: Dianna Rossetti, NP  Objective: AAO 3, NAD DP/PT pulses palpable, CRT less than 3 seconds Protective sensation decreased with Simms Weinstein monofilament, Achilles tendon reflex intact.  Nails hypertrophic, dystrophic, elongated, brittle, discolored 10. There is tenderness overlying the nails 1-5 bilaterally. There is no surrounding erythema or drainage along the nail sites. There is no area pinpoint bony tenderness there is no pain with vibratory sensation.  There is no overlying edema, erythema, increase in warmth.  She describes the pain as a burning pain and feels like of bee stinging sensation.  No open lesions or pre-ulcerative lesions are identified. No other areas of tenderness bilateral lower extremities. No overlying edema, erythema, increased warmth. No pain with calf compression, swelling, warmth, erythema.  Assessment: Patient presents with symptomatic onychomycosis; neuropathy  Plan: -Treatment options including alternatives, risks, complications were discussed -Nails sharply debrided 10 without complication/bleeding. -In regards to neuropathy discussed continuing gabapentin.  We will try to increase her gabapentin 100 mg at nighttime to see if this will help.  She has not had any side effects the medication this time we will continue to monitor for any side effects and call the office should any occur. -Discussed  daily foot inspection. If there are any changes, to call the office immediately.  -Follow-up in 3 months or sooner if any problems are to arise. In the meantime, encouraged to call the office with any questions, concerns, changes symptoms.  Celesta Gentile, DPM

## 2017-01-05 ENCOUNTER — Ambulatory Visit: Payer: Medicare Other

## 2017-02-01 ENCOUNTER — Ambulatory Visit: Payer: Medicare Other

## 2017-02-04 ENCOUNTER — Ambulatory Visit: Payer: Medicare Other

## 2017-02-22 ENCOUNTER — Telehealth (INDEPENDENT_AMBULATORY_CARE_PROVIDER_SITE_OTHER): Payer: Self-pay | Admitting: Orthopedic Surgery

## 2017-02-22 MED ORDER — TRAMADOL HCL 50 MG PO TABS: ORAL_TABLET | ORAL | 0 refills | Status: DC

## 2017-02-22 NOTE — Telephone Encounter (Signed)
Medication refill   Pt called requesting pain med for her BI knee pain.Im not sure if pt is currently taking any pain meds pt didn't specify a particular med. Pt is having a lot of pain.

## 2017-02-22 NOTE — Telephone Encounter (Signed)
Called to pharmacy. Advised patient done.  

## 2017-02-22 NOTE — Telephone Encounter (Signed)
Ok for ultram 1 po q d # 45 pls call thx

## 2017-02-22 NOTE — Telephone Encounter (Signed)
Please advise. Patient last seen for bilateral knee pain 10/01/16. She received #60 ultram 1 po q 12 hrs prn at that time.

## 2017-03-01 ENCOUNTER — Ambulatory Visit: Payer: Medicare Other

## 2017-03-30 ENCOUNTER — Ambulatory Visit (INDEPENDENT_AMBULATORY_CARE_PROVIDER_SITE_OTHER): Payer: Medicare Other

## 2017-03-30 ENCOUNTER — Ambulatory Visit (INDEPENDENT_AMBULATORY_CARE_PROVIDER_SITE_OTHER): Payer: Medicare Other | Admitting: Orthopedic Surgery

## 2017-03-30 ENCOUNTER — Telehealth (INDEPENDENT_AMBULATORY_CARE_PROVIDER_SITE_OTHER): Payer: Self-pay | Admitting: Orthopedic Surgery

## 2017-03-30 DIAGNOSIS — M1712 Unilateral primary osteoarthritis, left knee: Secondary | ICD-10-CM

## 2017-03-30 DIAGNOSIS — M25562 Pain in left knee: Secondary | ICD-10-CM | POA: Diagnosis not present

## 2017-03-30 DIAGNOSIS — M545 Low back pain: Secondary | ICD-10-CM | POA: Diagnosis not present

## 2017-03-30 DIAGNOSIS — M25561 Pain in right knee: Secondary | ICD-10-CM

## 2017-03-30 MED ORDER — TRAMADOL HCL 50 MG PO TABS: ORAL_TABLET | ORAL | 0 refills | Status: DC

## 2017-03-30 NOTE — Telephone Encounter (Signed)
Pt called in a lot of pain can barely sleep she would like come in today if possible

## 2017-03-30 NOTE — Telephone Encounter (Signed)
appt worked  In for today. Advised likely would have to wait due to work in.

## 2017-04-02 ENCOUNTER — Encounter (INDEPENDENT_AMBULATORY_CARE_PROVIDER_SITE_OTHER): Payer: Self-pay | Admitting: Orthopedic Surgery

## 2017-04-02 DIAGNOSIS — M1712 Unilateral primary osteoarthritis, left knee: Secondary | ICD-10-CM

## 2017-04-02 MED ORDER — LIDOCAINE HCL 1 % IJ SOLN
5.0000 mL | INTRAMUSCULAR | Status: AC | PRN
  Administered 2017-04-02: 5 mL

## 2017-04-02 MED ORDER — METHYLPREDNISOLONE ACETATE 40 MG/ML IJ SUSP
40.0000 mg | INTRAMUSCULAR | Status: AC | PRN
  Administered 2017-04-02: 40 mg via INTRA_ARTICULAR

## 2017-04-02 MED ORDER — BUPIVACAINE HCL 0.25 % IJ SOLN
4.0000 mL | INTRAMUSCULAR | Status: AC | PRN
  Administered 2017-04-02: 4 mL via INTRA_ARTICULAR

## 2017-04-02 NOTE — Progress Notes (Signed)
Office Visit Note   Patient: Nancy [name]           Date of Birth: 30-Jul-1940           MRN: [id number] Visit Date: 03/30/2017 Requested by: Dianna Rossetti, NP [address] [place], [place] [address] PCP: Dianna Rossetti, NP  Subjective: Chief Complaint  Patient presents with  . Left Knee - Edema, Pain  . Right Knee - Pain    HPI: Khali is a patient with bilateral knee pain right worse than left over the last 4 weeks.  It is been worse since Friday.  She denies any history of injury.  She is also having some new low back pain due to difficulty walking.  She is not having any fevers or chills.  She has had previous back surgery which did become infected from the incision.  She is taking tramadol and Tylenol for her symptoms.              ROS: All systems reviewed are negative as they relate to the chief complaint within the history of present illness.  Patient denies  fevers or chills.   Assessment & Plan: Visit Diagnoses:  1. Left knee pain, unspecified chronicity   2. Right knee pain, unspecified chronicity   3. Low back pain, unspecified back pain laterality, unspecified chronicity, with sciatica presence unspecified     Plan: Impression is bilateral knee pain in a diabetic patient with prior history of back infection who currently has a back fusion.  No fevers or chills.  The knees themselves are not warm.  Left knee is aspirated and injected today.  If that helps she should come back and get the right one done.  Because of her diabetes I do not want to do both knee injections today.  Fluid is very clear and had no evidence of infection.  I am in her refill her tramadol x1 time only.  I will see her back next week if this helps her left knee and we can inject her right knee.  Follow-Up Instructions: Return if symptoms worsen or fail to improve.   Orders:  Orders Placed This Encounter  Procedures  . XR Knee 1-2 Views Right  . XR Knee 1-2 Views Left  . XR  Lumbar Spine 2-3 Views   Meds ordered this encounter  Medications  . traMADol (ULTRAM) 50 MG tablet    Sig: 1 po q 8 hrs prn pain    Dispense:  45 tablet    Refill:  0      Procedures: Large Joint Inj: L knee on 04/02/2017 10:43 AM Indications: diagnostic evaluation, joint swelling and pain Details: 18 G 1.5 in needle, superolateral approach  Arthrogram: No  Medications: 5 mL lidocaine 1 %; 40 mg methylPREDNISolone acetate 40 MG/ML; 4 mL bupivacaine 0.25 % Aspirate: 40 mL yellow Outcome: tolerated well, no immediate complications Procedure, treatment alternatives, risks and benefits explained, specific risks discussed. Consent was given by the patient. Immediately prior to procedure a time out was called to verify the correct patient, procedure, equipment, support staff and site/side marked as required. Patient was prepped and draped in the usual sterile fashion.       Clinical Data: No additional findings.  Objective: Vital Signs: There were no vitals taken for this visit.  Physical Exam:   Constitutional: Patient appears well-developed HEENT:  Head: Normocephalic Eyes:EOM are normal Neck: Normal range of motion Cardiovascular: Normal rate Pulmonary/chest: Effort normal Neurologic: Patient is  alert Skin: Skin is warm Psychiatric: Patient has normal mood and affect    Ortho Exam: Orthopedic exam demonstrates mild effusion in the left knee no effusion in the right knee there is no real warmth to either knee.  No groin pain with internal/external rotation of the leg.  She is got about a 5 degree flexion contracture bilaterally.  Pedal pulses palpable.  No nerve root tension signs and no paresthesias L1 S1 bilaterally.  Specialty Comments:  No specialty comments available.  Imaging: No results found.   PMFS History: Patient Active Problem List   Diagnosis Date Noted  . Chest pain 10/07/2016  . SOB (shortness of breath) 10/07/2016  . Pseudogout of bilateral  knees 03/16/2016  . Osteoarthritis 03/16/2016  . SIRS (systemic inflammatory response syndrome) ([place]) 03/14/2016  . Elevated sed rate 03/14/2016  . Primary osteoarthritis of both knees 01/01/2016  . Postoperative wound infection 12/18/2015  . Difficulty in walking, not elsewhere classified   . Status post lumbar spinal fusion   . Uncontrolled type 2 diabetes mellitus with complication ([place])   . 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 Angioedema   . Hyperlipidemia 10/01/2015  . Gait instability 02/19/2013  . Pulmonary hyperinflation 01/17/2013  . Diastolic dysfunction   . Essential hypertension, benign 01/03/2013   Past Medical History:  Diagnosis Date  . Allergy   . Breast cyst    left breast  . Chest pain   . Complication of anesthesia 1986   slow to awaken after cholecystectomy  . Diastolic dysfunction    with mild pulmonary HTN by echo 07/2011  . Gastric ulcer   . GERD (gastroesophageal reflux disease)   . Gout   . Hyperlipidemia   . Hypertension   . 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 Osteoarthritis   . Osteopenia   . Shortness of breath dyspnea    with activity  . Spine deformity    lower spine  . Type II diabetes mellitus ([facility])     Family History  Problem Relation Age of Onset  . Heart disease Mother   . Cancer Sister        ovarian    Past Surgical History:  Procedure Laterality Date  . ABDOMINAL ADHESION SURGERY    . ABDOMINAL HYSTERECTOMY    . ANUS SURGERY     for torn tissue  . APPENDECTOMY    . BREAST BIOPSY    . BREAST LUMPECTOMY     left  . CHOLECYSTECTOMY  1986  . COLONOSCOPY    . ESOPHAGOGASTRODUODENOSCOPY (EGD) WITH PROPOFOL N/A 07/18/2013   Procedure: ESOPHAGOGASTRODUODENOSCOPY (EGD) WITH PROPOFOL;  Surgeon: Arta Silence, MD;  Location: [facility];  Service: Endoscopy;  Laterality: N/A;  . EUS N/A 07/18/2013   Procedure: ESOPHAGEAL ENDOSCOPIC ULTRASOUND (EUS) RADIAL;  Surgeon: Arta Silence, MD;  Location: [facility];  Service: Endoscopy;  Laterality: N/A;  . IR GENERIC HISTORICAL  10/30/2015   IR US GUIDE VASC ACCESS RIGHT 10/30/2015 Saverio Danker, PA-C [facility]  . IR GENERIC HISTORICAL  10/30/2015   IR FLUORO GUIDE CV LINE RIGHT 10/30/2015 Saverio Danker, PA-C [facility]  . LUMBAR WOUND DEBRIDEMENT N/A 10/01/2015   Procedure: LUMBAR WOUND DEBRIDEMENT;  Surgeon: Melina Schools, MD;  Location: [facility];  Service: Orthopedics;  Laterality: N/A;  . LUMBAR WOUND DEBRIDEMENT N/A 10/15/2015   Procedure: [facility] OUT AND CLOSURE OF BACK WOUND;  Surgeon: Melina Schools, MD;  Location: [facility];  Service: Orthopedics;  Laterality: N/A;  .  MASS EXCISION Left 07/24/2015   Procedure: EXCISION MASS LEFT LONG FINGER;  Surgeon: Leanora Cover, MD;  Location: [facility];  Service: Orthopedics;  Laterality: Left;  . SPINAL FUSION N/A 09/17/2015   Procedure: FUSION POSTERIOR SPINAL MULTILEVEL L4- S1;  Surgeon: Melina Schools, MD;  Location: [facility];  Service: Orthopedics;  Laterality: N/A;  . TRANSFORAMINAL LUMBAR INTERBODY FUSION (TLIF) WITH PEDICLE SCREW FIXATION 1 LEVEL N/A 09/17/2015   Procedure: TRANSFORAMINAL LUMBAR INTERBODY FUSION (TLIF) WITH PEDICLE SCREW FIXATION 1 LEVEL , Lumbar 4-5;  Surgeon: Melina Schools, MD;  Location: [facility];  Service: Orthopedics;  Laterality: N/A;   Social History   Occupational History  . Not on file  Tobacco Use  . Smoking status: Never Smoker  . Smokeless tobacco: Never Used  Substance and Sexual Activity  . Alcohol use: No    Comment: occasional wine  . Drug use: No  . Sexual activity: Not Currently

## 2017-04-06 ENCOUNTER — Encounter (INDEPENDENT_AMBULATORY_CARE_PROVIDER_SITE_OTHER): Payer: Self-pay | Admitting: Orthopedic Surgery

## 2017-04-06 ENCOUNTER — Ambulatory Visit (INDEPENDENT_AMBULATORY_CARE_PROVIDER_SITE_OTHER): Payer: Medicare Other | Admitting: Orthopedic Surgery

## 2017-04-06 DIAGNOSIS — M1711 Unilateral primary osteoarthritis, right knee: Secondary | ICD-10-CM

## 2017-04-06 MED ORDER — LIDOCAINE HCL 1 % IJ SOLN
5.0000 mL | INTRAMUSCULAR | Status: AC | PRN
  Administered 2017-04-06: 5 mL

## 2017-04-06 MED ORDER — BUPIVACAINE HCL 0.25 % IJ SOLN
4.0000 mL | INTRAMUSCULAR | Status: AC | PRN
  Administered 2017-04-06: 4 mL via INTRA_ARTICULAR

## 2017-04-06 MED ORDER — METHYLPREDNISOLONE ACETATE 40 MG/ML IJ SUSP
40.0000 mg | INTRAMUSCULAR | Status: AC | PRN
  Administered 2017-04-06: 40 mg via INTRA_ARTICULAR

## 2017-04-06 NOTE — Progress Notes (Signed)
   Procedure Note  Patient: Nancy [name]             Date of Birth: 02/28/1940           MRN: [id number]             Visit Date: 04/06/2017  Procedures: Visit Diagnoses: Unilateral primary osteoarthritis, right knee  Large Joint Inj: R knee on 04/06/2017 12:10 PM Indications: diagnostic evaluation, joint swelling and pain Details: 18 G 1.5 in needle, superolateral approach  Arthrogram: No  Medications: 5 mL lidocaine 1 %; 40 mg methylPREDNISolone acetate 40 MG/ML; 4 mL bupivacaine 0.25 % Outcome: tolerated well, no immediate complications Procedure, treatment alternatives, risks and benefits explained, specific risks discussed. Consent was given by the patient. Immediately prior to procedure a time out was called to verify the correct patient, procedure, equipment, support staff and site/side marked as required. Patient was prepped and draped in the usual sterile fashion.      Patient presents for scheduled right knee injection.  Had left knee injected last week and did well with that.

## 2017-06-09 ENCOUNTER — Ambulatory Visit (INDEPENDENT_AMBULATORY_CARE_PROVIDER_SITE_OTHER): Payer: Medicare Other | Admitting: Podiatry

## 2017-06-09 DIAGNOSIS — B351 Tinea unguium: Secondary | ICD-10-CM

## 2017-06-09 DIAGNOSIS — G629 Polyneuropathy, unspecified: Secondary | ICD-10-CM | POA: Diagnosis not present

## 2017-06-09 DIAGNOSIS — M79674 Pain in right toe(s): Secondary | ICD-10-CM

## 2017-06-09 DIAGNOSIS — M79675 Pain in left toe(s): Secondary | ICD-10-CM | POA: Diagnosis not present

## 2017-06-09 MED ORDER — DICLOFENAC SODIUM 1 % TD GEL: 2.0000 g | Freq: Four times a day (QID) | TRANSDERMAL | 2 refills | Status: DC

## 2017-06-10 NOTE — Progress Notes (Signed)
Subjective: 77 y.o. returns the office today for painful, elongated, thickened toenails which she cannot trim herself. Denies any redness or drainage around the nails.  She is taking gabapentin for neuropathy. She has no other concerns to her feet.  She recently has had shoulder replacement on the right side.    PCP: Dianna Rossetti, NP  Objective: AAO 3, NAD DP/PT pulses palpable, CRT less than 3 seconds Mild chronic ankle swelling present but no pain or erythema. Protective sensation decreased with Derrel Nip monofilament, Achilles tendon reflex intact.  Nails hypertrophic, dystrophic, elongated, brittle, discolored 9. There is tenderness overlying the nails 1-5 on the right and 2-5 on the left. There is no surrounding erythema or drainage along the nail sites. No open lesions or pre-ulcerative lesions are identified. No other areas of tenderness bilateral lower extremities. No overlying edema, erythema, increased warmth. No pain with calf compression, swelling, warmth, erythema.  Assessment: Patient presents with symptomatic onychomycosis  Plan: -Treatment options including alternatives, risks, complications were discussed -Nails sharply debrided 9 without complication/bleeding. -Discussed daily foot inspection. If there are any changes, to call the office immediately.  -Follow-up in 3 months or sooner if any problems are to arise. In the meantime, encouraged to call the office with any questions, concerns, changes symptoms.  Celesta Gentile, DPM

## 2017-06-10 NOTE — Addendum Note (Signed)
Addended by: Harriett Sine D on: 06/10/2017 09:18 AM   Modules accepted: Orders

## 2017-07-21 ENCOUNTER — Ambulatory Visit (INDEPENDENT_AMBULATORY_CARE_PROVIDER_SITE_OTHER): Payer: Medicare Other | Admitting: Podiatry

## 2017-07-21 DIAGNOSIS — L600 Ingrowing nail: Secondary | ICD-10-CM | POA: Diagnosis not present

## 2017-07-21 DIAGNOSIS — G629 Polyneuropathy, unspecified: Secondary | ICD-10-CM | POA: Diagnosis not present

## 2017-07-24 NOTE — Progress Notes (Signed)
Subjective: 77 year old female presents the office today for concerns of possible ingrown toenails to both her big toes.  She denies any redness or drainage or any swelling to the toenail sites.  She also states that physical therapy is been helping for the neuropathy pain she is no longer getting the sharp pain that she was.  She has 2 more treatments of the Neurgenix. She has no new concerns.  Denies any systemic complaints such as fevers, chills, nausea, vomiting. No acute changes since last appointment, and no other complaints at this time.   Objective: AAO x3, NAD DP/PT pulses palpable bilaterally, CRT less than 3 seconds There is mild incurvation distally on both medial lateral aspects of bilateral hallux toenails there is no edema, erythema, drainage or pus or any clinical signs of infection noted today. Overall her neuropathy symptoms are improving. No open lesions or pre-ulcerative lesions.  No pain with calf compression, swelling, warmth, erythema  Assessment: Ingrown toenails bilateral hallux; neuropathy  Plan: -All treatment options discussed with the patient including all alternatives, risks, complications.  -I debrided bilateral hallux toenails removed dispensed on the portion of the ingrown toenail today without any complications or bleeding.  She states her blood sugars been high but she has not checked it in several weeks.  Is scheduled to see her primary care physician to have blood work.  Because this been a hold off on a partial nail avulsion however if her blood sugars under control we can discuss a partial nail avulsion with chemical matricectomy. -Finished physical therapy. -Patient encouraged to call the office with any questions, concerns, change in symptoms.   Trula Slade DPM

## 2017-07-26 ENCOUNTER — Telehealth: Payer: Self-pay | Admitting: Cardiovascular Disease

## 2017-07-26 NOTE — Telephone Encounter (Signed)
Called patient with an appointment with Dr. Radford Pax on Friday.

## 2017-07-26 NOTE — Telephone Encounter (Signed)
DOD call

## 2017-07-26 NOTE — Telephone Encounter (Signed)
Received call from operator stating call was for DOD, Dr. Acie Fredrickson. I spoke with Dianna Rossetti, NP from [facility] who states patient is in the office and needs a soon appointment with Dr. Radford Pax, primary cardiologist. She states this is not an urgent call she was just calling to get a sooner appointment than September 27 for patient with Dr. Radford Pax. She states patient is non-compliant with her medication and is having worsening SOB; she has hx of pulmonary hypertension. I advised that I will forward the message to Dr. Radford Pax and that someone from our office will call the patient. She thanked me for my help.

## 2017-07-29 ENCOUNTER — Ambulatory Visit (INDEPENDENT_AMBULATORY_CARE_PROVIDER_SITE_OTHER): Payer: Medicare Other | Admitting: Cardiology

## 2017-07-29 ENCOUNTER — Encounter: Payer: Self-pay | Admitting: Cardiology

## 2017-07-29 ENCOUNTER — Telehealth: Payer: Self-pay | Admitting: *Deleted

## 2017-07-29 VITALS — BP 134/66 | HR 75 | Ht 60.0 in | Wt 183.1 lb

## 2017-07-29 DIAGNOSIS — I10 Essential (primary) hypertension: Secondary | ICD-10-CM

## 2017-07-29 DIAGNOSIS — R0602 Shortness of breath: Secondary | ICD-10-CM | POA: Diagnosis not present

## 2017-07-29 DIAGNOSIS — R079 Chest pain, unspecified: Secondary | ICD-10-CM

## 2017-07-29 DIAGNOSIS — R002 Palpitations: Secondary | ICD-10-CM | POA: Diagnosis not present

## 2017-07-29 DIAGNOSIS — I272 Pulmonary hypertension, unspecified: Secondary | ICD-10-CM | POA: Insufficient documentation

## 2017-07-29 DIAGNOSIS — G4719 Other hypersomnia: Secondary | ICD-10-CM

## 2017-07-29 LAB — CBC
Hematocrit: 37.8 % (ref 34.0–46.6)
Hemoglobin: 13.3 g/dL (ref 11.1–15.9)
MCH: 29.3 pg (ref 26.6–33.0)
MCHC: 35.2 g/dL (ref 31.5–35.7)
MCV: 83 fL (ref 79–97)
Platelets: 265 x10E3/uL (ref 150–450)
RBC: 4.54 x10E6/uL (ref 3.77–5.28)
RDW: 15.2 % (ref 12.3–15.4)
WBC: 8.5 x10E3/uL (ref 3.4–10.8)

## 2017-07-29 LAB — BASIC METABOLIC PANEL
BUN/Creatinine Ratio: 11 — ABNORMAL LOW (ref 12–28)
BUN: 11 mg/dL (ref 8–27)
CALCIUM: 10.2 mg/dL (ref 8.7–10.3)
CHLORIDE: 97 mmol/L (ref 96–106)
CO2: 23 mmol/L (ref 20–29)
Creatinine, Ser: 1.02 mg/dL — ABNORMAL HIGH (ref 0.57–1.00)
GFR calc Af Amer: 62 mL/min/{1.73_m2} (ref >59)
GFR calc non Af Amer: 54 mL/min/{1.73_m2} — ABNORMAL LOW (ref >59)
GLUCOSE: 174 mg/dL — AB (ref 65–99)
Potassium: 4.5 mmol/L (ref 3.5–5.2)
Sodium: 140 mmol/L (ref 134–144)

## 2017-07-29 LAB — TSH: TSH: 4.64 u[IU]/mL — ABNORMAL HIGH (ref 0.450–4.500)

## 2017-07-29 LAB — PRO B NATRIURETIC PEPTIDE: NT-Pro BNP: 141 pg/mL (ref 0–738)

## 2017-07-29 MED ORDER — METOPROLOL TARTRATE 50 MG PO TABS: ORAL_TABLET | ORAL | 0 refills | Status: DC

## 2017-07-29 NOTE — Patient Instructions (Addendum)
Medication Instructions:  Your physician recommends that you continue on your current medications as directed. Please refer to the Current Medication list given to you today.  Labwork: TODAY: BMET, CBC, TSH, BNP  Testing/Procedures: Your physician has requested that you have an echocardiogram. Echocardiography is a painless test that uses sound waves to create images of your heart. It provides your doctor with information about the size and shape of your heart and how well your heart's chambers and valves are working. This procedure takes approximately one hour. There are no restrictions for this procedure.  Your physician has recommended that you wear an event monitor. Event monitors are medical devices that record the heart's electrical activity. Doctors most often [place] these monitors to diagnose arrhythmias. Arrhythmias are problems with the speed or rhythm of the heartbeat. The monitor is a small, portable device. You can wear one while you do your normal daily activities. This is usually used to diagnose what is causing palpitations/syncope (passing out).  Your physician has recommended that you have a sleep study. This test records several body functions during sleep, including: brain activity, eye movement, oxygen and carbon dioxide blood levels, heart rate and rhythm, breathing rate and rhythm, the flow of air through your mouth and nose, snoring, body muscle movements, and chest and belly movement.  Your physician has requested that you have cardiac CT. Cardiac computed tomography (CT) is a painless test that uses an x-ray machine to take clear, detailed pictures of your heart. For further information please visit [facility]. Please follow instruction sheet as given.   Follow-Up: Your physician wants you to follow-up in: 1 year with Dr. Radford Pax. You will receive a reminder letter in the mail two months in advance. If you don't receive a letter, please call our office to schedule the  follow-up appointment.   Any Other Special Instructions Will Be Listed Below (If Applicable).  CARDIAC CT INSTRUCTIONS Please arrive at the [facility] main entrance of [place] _____ at _____ AM (30-45 minutes prior to test start time)  [facility] [place], [place] [address] [phone]  Proceed to the [facility] Radiology Department (First Floor).  Please follow these instructions carefully (unless otherwise directed):   On the Night Before the Test: . Drink plenty of water. . Do not consume any caffeinated/decaffeinated beverages or chocolate 12 hours prior to your test. . Do not take any antihistamines 12 hours prior to your test. . If you take Metformin do not take 24 hours prior to test.  On the Day of the Test: . Drink plenty of water. Do not drink any water within one hour of the test. . Do not eat any food 4 hours prior to the test. . You may take your regular medications prior to the test. . IF NOT ON A BETA BLOCKER - Take 50 mg of lopressor (metoprolol) one hour before the test. . HOLD Furosemide morning of the test. . HOLD metformin . HOLD glipizide  After the Test: . Drink plenty of water. . After receiving IV contrast, you may experience a mild flushed feeling. This is normal. . On occasion, you may experience a mild rash up to 24 hours after the test. This is not dangerous. If this occurs, you can take Benadryl 25 mg and increase your fluid intake. . If you experience trouble breathing, this can be serious. If it is severe call 911 IMMEDIATELY. If it is mild, please call our office. . If you  take any of these medications: Glipizide/Metformin, Avandament, Glucavance, please do not take 48 hours after completing test.   If you need a refill on your cardiac medications before your next appointment, please call your pharmacy.

## 2017-07-29 NOTE — Telephone Encounter (Signed)
-----   Message from Cleon Gustin, [place] sent at 07/29/2017 11:04 AM EDT ----- Regarding: HOME Sleep Study Dr. Radford Pax patient needs HOME Sleep Study for Excessive Daytime Sleepiness (patient unable to sleep in lab). ESS= 10 and documented in Epic. Please pre-cert and schedule patient. Thanks

## 2017-07-29 NOTE — Progress Notes (Signed)
Cardiology Office Note:    Date:  07/29/2017   ID:  Nancy [name], DOB 1940-11-15, MRN [id number]  PCP:  Dianna Rossetti, NP  Cardiologist:  No primary care provider on file.    Referring MD: Dianna Rossetti, NP   Chief Complaint  Patient presents with  . Shortness of Breath    History of Present Illness:    Nancy [name] is a 77 y.o. female with a hx of  HTN and diastolic dysfunction .  She also has a history of mild pulmonary hypertension by 2D echocardiogram in July 2013.  She had a Lexiscan Myoview for chest pain 10/13/2016 which showed no inducible ischemia.  She called  our office recently stating that she was having problems with worsening shortness of breath and needed to get in with a sooner appointment than September which was the appointment she already had scheduled.  She comes into the office today with a multitude of complaints.  She states that she has been having intermittent chest pain that she describes as a pressure that is nonexertional with no radiation to her neck or jaw or arm.  There is no associated nausea or diaphoresis.  She also complains of shortness of breath that occurs with minimal exertion.  She has been severely fatigued as well and does not feel like doing anything at home.  She does not know she snores but she feels tired when she wakes up in the morning.  Apparently she had a sleep study a few years ago but could not sleep but CPAP and she tells me it was nondiagnostic.  She denies any PND, orthopnea,dizziness, or syncope.  She says that she has been having a lot of palpitations recently that seem to have gotten worse.  She had a event monitor back in 2016 that showed PACs.  She also is complaining of lower extremity edema that she says occurs all the time and never goes away.  Past Medical History:  Diagnosis Date  . Allergy   . Breast cyst    left breast  . Chest pain   . Complication of anesthesia 1986   slow to awaken after cholecystectomy    . Diastolic dysfunction    with mild pulmonary HTN by echo 07/2011  . Gastric ulcer   . GERD (gastroesophageal reflux disease)   . Gout   . Hyperlipidemia   . Hypertension   . 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 Osteoarthritis   . Osteopenia   . Shortness of breath dyspnea    with activity  . Spine deformity    lower spine  . Type II diabetes mellitus ([place])     Past Surgical History:  Procedure Laterality Date  . ABDOMINAL ADHESION SURGERY    . ABDOMINAL HYSTERECTOMY    . ANUS SURGERY     for torn tissue  . APPENDECTOMY    . BREAST BIOPSY    . BREAST LUMPECTOMY     left  . CHOLECYSTECTOMY  1986  . COLONOSCOPY    . ESOPHAGOGASTRODUODENOSCOPY (EGD) WITH PROPOFOL N/A 07/18/2013   Procedure: ESOPHAGOGASTRODUODENOSCOPY (EGD) WITH PROPOFOL;  Surgeon: Arta Silence, MD;  Location: [facility];  Service: Endoscopy;  Laterality: N/A;  . EUS N/A 07/18/2013   Procedure: ESOPHAGEAL ENDOSCOPIC ULTRASOUND (EUS) RADIAL;  Surgeon: Arta Silence, MD;  Location: [facility];  Service: Endoscopy;  Laterality: N/A;  . IR GENERIC HISTORICAL  10/30/2015   IR US GUIDE VASC ACCESS RIGHT 10/30/2015 Saverio Danker, PA-C [facility]  .  IR GENERIC HISTORICAL  10/30/2015   IR FLUORO GUIDE CV LINE RIGHT 10/30/2015 Saverio Danker, PA-C [facility]  . LUMBAR WOUND DEBRIDEMENT N/A 10/01/2015   Procedure: LUMBAR WOUND DEBRIDEMENT;  Surgeon: Melina Schools, MD;  Location: [place];  Service: Orthopedics;  Laterality: N/A;  . LUMBAR WOUND DEBRIDEMENT N/A 10/15/2015   Procedure: [facility] OUT AND CLOSURE OF BACK WOUND;  Surgeon: Melina Schools, MD;  Location: [place];  Service: Orthopedics;  Laterality: N/A;  . MASS EXCISION Left 07/24/2015   Procedure: EXCISION MASS LEFT LONG FINGER;  Surgeon: Leanora Cover, MD;  Location: [place];  Service: Orthopedics;  Laterality: Left;  . SPINAL FUSION N/A 09/17/2015   Procedure: FUSION POSTERIOR SPINAL MULTILEVEL L4- S1;  Surgeon: Melina Schools, MD;  Location:  [place];  Service: Orthopedics;  Laterality: N/A;  . TRANSFORAMINAL LUMBAR INTERBODY FUSION (TLIF) WITH PEDICLE SCREW FIXATION 1 LEVEL N/A 09/17/2015   Procedure: TRANSFORAMINAL LUMBAR INTERBODY FUSION (TLIF) WITH PEDICLE SCREW FIXATION 1 LEVEL , Lumbar 4-5;  Surgeon: Melina Schools, MD;  Location: [place];  Service: Orthopedics;  Laterality: N/A;    Current Medications: No outpatient medications have been marked as taking for the 07/29/17 encounter (Office Visit) with Sueanne Margarita, MD.     Allergies:   Morphine and related; Toradol [ketorolac tromethamine]; and Codeine   Social History   Socioeconomic History  . Marital status: Divorced    Spouse name: Not on file  . Number of children: 4  . Years of education: 24  . Highest education level: Not on file  Occupational History  . Not on file  Social Needs  . Financial resource strain: Not on file  . Food insecurity:    Worry: Not on file    Inability: Not on file  . Transportation needs:    Medical: Not on file    Non-medical: Not on file  Tobacco Use  . Smoking status: Never Smoker  . Smokeless tobacco: Never Used  Substance and Sexual Activity  . Alcohol use: No    Comment: occasional wine  . Drug use: No  . Sexual activity: Not Currently  Lifestyle  . Physical activity:    Days per week: Not on file    Minutes per session: Not on file  . Stress: Not on file  Relationships  . Social connections:    Talks on phone: Not on file    Gets together: Not on file    Attends religious service: Not on file    Active member of club or organization: Not on file    Attends meetings of clubs or organizations: Not on file    Relationship status: Not on file  Other Topics Concern  . Not on file  Social History Narrative   Patient is single, has 3 children living 1 deceased   Patient is right handed   Education level is 12   Caffeine consumption is 0     Family History: The patient's family history includes Cancer in her  sister; Heart disease in her mother.  ROS:   Please see the history of present illness.    ROS  All other systems reviewed and negative.   EKGs/Labs/Other Studies Reviewed:    The following studies were reviewed today: EKG  EKG:  EKG is not ordered today.    Recent Labs: No results found for requested labs within last 8760 hours.   Recent Lipid Panel No results found for: CHOL, TRIG, HDL, CHOLHDL, VLDL, LDLCALC, LDLDIRECT  Physical  Exam:    VS:  There were no vitals taken for this visit.    Wt Readings from Last 3 Encounters:  10/13/16 184 lb (83.5 kg)  10/07/16 184 lb 9.6 oz (83.7 kg)  03/13/16 180 lb 12.4 oz (82 kg)     GEN:  Well nourished, well developed in no acute distress HEENT: Normal NECK: No JVD; No carotid bruits LYMPHATICS: No lymphadenopathy CARDIAC: RRR, no murmurs, rubs, gallops RESPIRATORY:  Clear to auscultation without rales, wheezing or rhonchi  ABDOMEN: Soft, non-tender, non-distended MUSCULOSKELETAL:  No edema; No deformity  SKIN: Warm and dry NEUROLOGIC:  Alert and oriented x 3 PSYCHIATRIC:  Normal affect   ASSESSMENT:    1. SOB (shortness of breath)   2. Essential hypertension, benign   3. Pulmonary hypertension, unspecified ([place])    PLAN:    In order of problems listed above:  1.  SOB - unclear etiology.  She had mild pulmonary hypertension on echo in 2013 but none noted on echo 2015.  I will repeat a 2D echocardiogram to reassess LV function as well as PA pressures.  We will check a 2D echocardiogram to reassess LV function and valves.   I will check a TSH, BNP, CBC and bmet today.  2.  Hypertension -BP is controlled on exam today.  3.  Pulmonary hypertension - this was very mild on echo in 2013 but no elevated PA SP was noted on echo in 2015.  I will repeat echo to make sure that PA pressures are still normal.  4.  Chest pain - this is very atypical and occurs mainly with non-exertion at rest.  There are no associated symptoms of  nausea or diaphoresis and no radiation of the discomfort.  It is midsternal in presentation.  She had a nuclear stress test less than a year ago that showed no ischemia, therefore I will proceed with a coronary CTA with FFR to determine if she has any underlying CAD.  5.  Palpitations -she has had palpitations for some time and had an event monitor back in 2016 that showed PACs.  She says her palpitations have gotten worse and are now fluttering sensation that occurs frequently.  I will repeat an event monitor.  6.  Excessive daytime sleepiness -she has been very fatigued and very sleepy during the day for some time now she says she does not feel like she wants to do anything during the day.  She had a sleep study done several years ago but she says was nondiagnostic because she did not sleep during the study.  I will get a home sleep study to evaluate further.  Medication Adjustments/Labs and Tests Ordered: Current medicines are reviewed at length with the patient today.  Concerns regarding medicines are outlined above.  No orders of the defined types were placed in this encounter.  No orders of the defined types were placed in this encounter.   Signed, Fransico Him, MD  07/29/2017 9:48 AM    [place]

## 2017-08-01 ENCOUNTER — Telehealth: Payer: Self-pay | Admitting: *Deleted

## 2017-08-01 NOTE — Telephone Encounter (Signed)
Staff message sent to Gae Bon ok to schedule home sleep study. patient has [employer] and does not require PA.

## 2017-08-01 NOTE — Telephone Encounter (Signed)
-----   Message from Cleon Gustin, [place] sent at 07/29/2017 11:04 AM EDT ----- Regarding: HOME Sleep Study Dr. Radford Pax patient needs HOME Sleep Study for Excessive Daytime Sleepiness (patient unable to sleep in lab). ESS= 10 and documented in Epic. Please pre-cert and schedule patient. Thanks

## 2017-08-04 ENCOUNTER — Other Ambulatory Visit (HOSPITAL_COMMUNITY): Payer: Medicare Other

## 2017-08-04 NOTE — Telephone Encounter (Signed)
Patient is aware and agreeable to Home Sleep Study through [facility]. Patient is scheduled for 08/26/17 at 11a to pick up home sleep kit and meet with Respiratory therapist at [facility]. Patient is aware that if this appointment date and time does not work for them they should contact Artis Delay directly at [phone]. Patient is aware that a sleep packet will be sent from [facility] in week. Patient is agreeable to treatment and thankful for call.

## 2017-08-05 ENCOUNTER — Ambulatory Visit (INDEPENDENT_AMBULATORY_CARE_PROVIDER_SITE_OTHER): Payer: Medicare Other

## 2017-08-05 ENCOUNTER — Ambulatory Visit (HOSPITAL_COMMUNITY): Payer: Medicare Other | Attending: Cardiovascular Disease

## 2017-08-05 ENCOUNTER — Other Ambulatory Visit: Payer: Self-pay

## 2017-08-05 DIAGNOSIS — E785 Hyperlipidemia, unspecified: Secondary | ICD-10-CM | POA: Insufficient documentation

## 2017-08-05 DIAGNOSIS — E119 Type 2 diabetes mellitus without complications: Secondary | ICD-10-CM | POA: Insufficient documentation

## 2017-08-05 DIAGNOSIS — E669 Obesity, unspecified: Secondary | ICD-10-CM | POA: Diagnosis not present

## 2017-08-05 DIAGNOSIS — I10 Essential (primary) hypertension: Secondary | ICD-10-CM | POA: Insufficient documentation

## 2017-08-05 DIAGNOSIS — R002 Palpitations: Secondary | ICD-10-CM | POA: Diagnosis not present

## 2017-08-05 DIAGNOSIS — I272 Pulmonary hypertension, unspecified: Secondary | ICD-10-CM | POA: Insufficient documentation

## 2017-08-05 DIAGNOSIS — R0602 Shortness of breath: Secondary | ICD-10-CM | POA: Insufficient documentation

## 2017-08-05 DIAGNOSIS — Z6835 Body mass index (BMI) 35.0-35.9, adult: Secondary | ICD-10-CM | POA: Insufficient documentation

## 2017-08-08 ENCOUNTER — Other Ambulatory Visit: Payer: Self-pay

## 2017-08-08 ENCOUNTER — Telehealth: Payer: Self-pay

## 2017-08-08 ENCOUNTER — Telehealth: Payer: Self-pay | Admitting: *Deleted

## 2017-08-08 DIAGNOSIS — Z79899 Other long term (current) drug therapy: Secondary | ICD-10-CM

## 2017-08-08 MED ORDER — FUROSEMIDE 40 MG PO TABS: 40.0000 mg | ORAL_TABLET | Freq: Every day | ORAL | 3 refills | Status: DC

## 2017-08-08 NOTE — Telephone Encounter (Signed)
LMAM.  In her monitor kit there are 3 boxes.  In box #3, there is a necklace version for the monitor.  Hypoallergenic electrodes included.  The sensor for the patch monitor slips right into the necklace version.  Wires are labelled RA (under right collar bone, LA (under left collarbone) and LL (under left breast axillary line).  The patient can switch to this alternate monitor to see if it is more comfortable.  She can also contact [employer] directly at [phone] to request sensitive skin electrodes.

## 2017-08-08 NOTE — Telephone Encounter (Signed)
lpmtcb 7/29 

## 2017-08-08 NOTE — Telephone Encounter (Signed)
Please talk to Abram Sander about this

## 2017-08-08 NOTE — Telephone Encounter (Signed)
Spoke to patient who tried all 3 boxes without success.  It is NOT a comfort reason, but alarming problem, something about alarming.  She would like someone to call her because her device continues to alarm and she has called the company and they have tried to help her without success.  Thank you

## 2017-08-08 NOTE — Telephone Encounter (Signed)
Please ask if she has tried the sensitive pads

## 2017-08-08 NOTE — Telephone Encounter (Signed)
Spoke with patient at length regarding problems she has been having with her monitor.  Abram Sander applied monitor 08/05/17, since that time monitor alarm has been going off consistently stating patch disconnected.  Patient had tried reapplying patch and has tried necklace version also.  Reviewed the types of skin products and soaps not to be used.  Patient will be coming in to see Terrianna Holsclaw at 3:00 tomorrow to have another patch applied.

## 2017-08-08 NOTE — Telephone Encounter (Signed)
Notes recorded by Frederik Schmidt, RN on 08/08/2017 at 9:41 AM EDT Spoke to patient with results/recommendations. She verbalized understanding. ------

## 2017-08-08 NOTE — Telephone Encounter (Signed)
-----   Message from Nancy Margarita, MD sent at 08/05/2017 10:23 PM EDT ----- Echo showed moderately thickened heart muscle with normal LVF.  It also showed possible increased pressure in the heart chambers.  Pro BNP was normal.  Please find out if she is having SOB.  If she is SOB or having LE edema,  then increase lasix to 40mg  daily and repeat BMET in 1 week

## 2017-08-08 NOTE — Telephone Encounter (Signed)
Spoke to patient who informed me that she cannot wear her monitor which was placed 7/26.  It is too uncomfortable.

## 2017-08-09 ENCOUNTER — Encounter: Payer: Self-pay | Admitting: *Deleted

## 2017-08-09 NOTE — Progress Notes (Signed)
Patient ID: Nancy [name], female   DOB: 1940-01-19, 77 y.o.   MRN: [id number] Patient having difficulties with Cardiac event monitor.  Patient came into office today to see if we could resolve issue with monitor.  After monitor applied by technician it was still not working correctly.  [employer] called and confirmed sensor was not working properly.  [employer] will ship a new monitor to the patient home.  She was instructed to call [employer] once she received the package to baseline.  Patient did exhibit signs of electrode sensitivity.  She will be using the necklace version of the monitor instead of the patch.  [employer] will also ship Soft-E electrodes for sensitive skin.

## 2017-08-15 ENCOUNTER — Other Ambulatory Visit: Payer: Medicare Other

## 2017-08-19 ENCOUNTER — Other Ambulatory Visit: Payer: Medicare Other

## 2017-08-22 ENCOUNTER — Telehealth: Payer: Self-pay | Admitting: Podiatry

## 2017-08-22 ENCOUNTER — Other Ambulatory Visit: Payer: Medicare Other | Admitting: *Deleted

## 2017-08-22 DIAGNOSIS — Z79899 Other long term (current) drug therapy: Secondary | ICD-10-CM

## 2017-08-22 NOTE — Telephone Encounter (Signed)
Hit foot in the shower yesterday and foot was bleeding- wants to know if she needs to be concerned. Does feel sore and did cover up the injury site.

## 2017-08-23 ENCOUNTER — Telehealth: Payer: Self-pay

## 2017-08-23 DIAGNOSIS — Z79899 Other long term (current) drug therapy: Secondary | ICD-10-CM

## 2017-08-23 LAB — BASIC METABOLIC PANEL
BUN / CREAT RATIO: 17 (ref 12–28)
BUN: 18 mg/dL (ref 8–27)
CALCIUM: 9.6 mg/dL (ref 8.7–10.3)
CO2: 22 mmol/L (ref 20–29)
Chloride: 100 mmol/L (ref 96–106)
Creatinine, Ser: 1.08 mg/dL — ABNORMAL HIGH (ref 0.57–1.00)
GFR, EST AFRICAN AMERICAN: 58 mL/min/{1.73_m2} — AB (ref >59)
GFR, EST NON AFRICAN AMERICAN: 50 mL/min/{1.73_m2} — AB (ref >59)
Glucose: 340 mg/dL — ABNORMAL HIGH (ref 65–99)
Potassium: 3.5 mmol/L (ref 3.5–5.2)
Sodium: 141 mmol/L (ref 134–144)

## 2017-08-23 MED ORDER — POTASSIUM CHLORIDE CRYS ER 20 MEQ PO TBCR: 20.0000 meq | EXTENDED_RELEASE_TABLET | Freq: Every day | ORAL | 3 refills | Status: DC

## 2017-08-23 NOTE — Telephone Encounter (Signed)
-----   Message from Sueanne Margarita, MD sent at 08/23/2017 12:08 PM EDT ----- Potassium low normal - please add Kdur 19meq daily and repeat BMET in 1 week.  Please forward to PCP for elevated BS

## 2017-08-23 NOTE — Telephone Encounter (Signed)
Spoke with patient about results. She accepted taking Potassium 20 meq, daily and is scheduled for a BMET on 8/20. She confirmed that he diet has been poor over the past few days and that is why the blood sugar is high.

## 2017-08-23 NOTE — Telephone Encounter (Signed)
Called and spoke with patient yesterday and patient stated that she was in the shower and cut her foot on the drain and her foot is sore and was bleeding and used neosporin and a bandaid and I stated to the patient that we need to have you come in and see Dr Jacqualyn Posey and that I would call patient back after I stated that I would talk to Dr Jacqualyn Posey and I did call patient back and got patient in this Thursday and patient stated that the bleeding had stopped and that patient would see [place] on Thursday. Lattie Haw

## 2017-08-25 ENCOUNTER — Ambulatory Visit (INDEPENDENT_AMBULATORY_CARE_PROVIDER_SITE_OTHER): Payer: Medicare Other | Admitting: Podiatry

## 2017-08-25 DIAGNOSIS — S91319A Laceration without foreign body, unspecified foot, initial encounter: Secondary | ICD-10-CM

## 2017-08-25 DIAGNOSIS — L600 Ingrowing nail: Secondary | ICD-10-CM

## 2017-08-25 DIAGNOSIS — M7742 Metatarsalgia, left foot: Secondary | ICD-10-CM

## 2017-08-25 DIAGNOSIS — G629 Polyneuropathy, unspecified: Secondary | ICD-10-CM

## 2017-08-25 DIAGNOSIS — M7741 Metatarsalgia, right foot: Secondary | ICD-10-CM

## 2017-08-25 MED ORDER — DICLOFENAC SODIUM 1 % TD GEL: 2.0000 g | Freq: Four times a day (QID) | TRANSDERMAL | 2 refills | Status: DC

## 2017-08-25 NOTE — Progress Notes (Signed)
Subjective: 77 year old female presents the office today for concerns of a cut to the bottom of her left foot.  She called earlier in the week since she was in the shower when she cut her foot she had some bleeding.  I want her to come in for evaluation.  She thinks she cut it on the drain in her shower.  She denies any drainage or pus currently denies any bleeding.  Denies any swelling to her feet and overall she feels well.  She also has secondary concerns of pain to the right foot she points along the medial aspect of the foot, the sesamoid apparatus where she gets discomfort she feels that she is walking on the ball at times.  No recent injury to the right foot.  Also asking for the big toenails be trimmed today as they are getting ingrown causing some discomfort with closed in shoes but denies any redness or drainage or swelling to the toenail sites. Denies any systemic complaints such as fevers, chills, nausea, vomiting. No acute changes since last appointment, and no other complaints at this time.   Objective: AAO x3, NAD DP/PT pulses palpable bilaterally, CRT less than 3 seconds Sensation decreased with SWMF.  On the plantar aspect of his left foot along submetatarsal 3 areas of linear skin fissure type area which appears to be healed from a superficial cut measuring approximate 3 cm in length.  There is minimal dried blood but there is no open sore identified at this time.  There is no drainage or pus there is no edema, erythema there is no pain to the area. Bilateral hallux toenails are mildly ingrown on both medial lateral corners without any drainage or pus but there is subjective tenderness palpation of the corners most with pressure in shoes. On the right foot there is prominence of the medial sesamoid subjectively this is where she gets discomfort to the area.  There is atrophy of the fat pad.  There is no area pinpoint tenderness pain to vibratory sensation otherwise. Hammertoes present   Flatfoot present  No open lesions or pre-ulcerative lesions.  No pain with calf compression, swelling, warmth, erythema  Assessment: Ulceration left foot which appears to be healing without signs of infection; ingrown toenail bilateral hallux; prominent medial sesamoid  Plan: -All treatment options discussed with the patient including all alternatives, risks, complications.  -Regards to the laceration area is healing well.  I want to keep some antibiotic ointment and a bandage on the area daily.  I also applied Steri-Strips to reinforce the area although it appears to be healing well.  Monitoring signs or symptoms of infection. -As a courtesy I debrided bilateral hallux toenails and complications of bleeding. -Metatarsal offloading pads were dispensed to the right foot to offload the sesamoids.  We will try to work on getting diabetic inserts for her.  Subjectively she also gets some pain in the top of her foot as well at times given that pain as well as her diabetes and sesamoid pain she will likely benefit from a custom orthotic. -Patient encouraged to call the office with any questions, concerns, change in symptoms.   Trula Slade DPM

## 2017-08-26 ENCOUNTER — Ambulatory Visit (HOSPITAL_BASED_OUTPATIENT_CLINIC_OR_DEPARTMENT_OTHER): Payer: Medicare Other | Attending: Cardiology | Admitting: Cardiology

## 2017-08-26 DIAGNOSIS — G4736 Sleep related hypoventilation in conditions classified elsewhere: Secondary | ICD-10-CM | POA: Insufficient documentation

## 2017-08-26 DIAGNOSIS — G4733 Obstructive sleep apnea (adult) (pediatric): Secondary | ICD-10-CM | POA: Diagnosis not present

## 2017-08-26 DIAGNOSIS — G4719 Other hypersomnia: Secondary | ICD-10-CM | POA: Insufficient documentation

## 2017-08-30 ENCOUNTER — Other Ambulatory Visit: Payer: Medicare Other

## 2017-08-30 DIAGNOSIS — Z79899 Other long term (current) drug therapy: Secondary | ICD-10-CM

## 2017-08-31 LAB — BASIC METABOLIC PANEL
BUN / CREAT RATIO: 13 (ref 12–28)
BUN: 11 mg/dL (ref 8–27)
CO2: 23 mmol/L (ref 20–29)
CREATININE: 0.84 mg/dL (ref 0.57–1.00)
Calcium: 9.3 mg/dL (ref 8.7–10.3)
Chloride: 101 mmol/L (ref 96–106)
GFR calc Af Amer: 79 mL/min/{1.73_m2} (ref >59)
GFR, EST NON AFRICAN AMERICAN: 68 mL/min/{1.73_m2} (ref >59)
Glucose: 152 mg/dL — ABNORMAL HIGH (ref 65–99)
Potassium: 3.5 mmol/L (ref 3.5–5.2)
SODIUM: 142 mmol/L (ref 134–144)

## 2017-09-05 ENCOUNTER — Other Ambulatory Visit: Payer: Self-pay | Admitting: *Deleted

## 2017-09-05 DIAGNOSIS — Z79899 Other long term (current) drug therapy: Secondary | ICD-10-CM

## 2017-09-06 ENCOUNTER — Ambulatory Visit: Payer: Medicare Other | Admitting: Orthotics

## 2017-09-06 DIAGNOSIS — IMO0002 Reserved for concepts with insufficient information to code with codable children: Secondary | ICD-10-CM

## 2017-09-06 DIAGNOSIS — E1165 Type 2 diabetes mellitus with hyperglycemia: Secondary | ICD-10-CM

## 2017-09-06 DIAGNOSIS — L600 Ingrowing nail: Secondary | ICD-10-CM

## 2017-09-06 DIAGNOSIS — E118 Type 2 diabetes mellitus with unspecified complications: Secondary | ICD-10-CM

## 2017-09-06 DIAGNOSIS — G629 Polyneuropathy, unspecified: Secondary | ICD-10-CM

## 2017-09-06 DIAGNOSIS — M2041 Other hammer toe(s) (acquired), right foot: Secondary | ICD-10-CM

## 2017-09-06 DIAGNOSIS — M2042 Other hammer toe(s) (acquired), left foot: Secondary | ICD-10-CM

## 2017-09-06 DIAGNOSIS — M7741 Metatarsalgia, right foot: Secondary | ICD-10-CM

## 2017-09-06 DIAGNOSIS — M7742 Metatarsalgia, left foot: Secondary | ICD-10-CM

## 2017-09-11 NOTE — Procedures (Signed)
   Patient Name: Nancy [name], [place] Date: 08/28/2017 Gender: Female D.O.B: 31-Mar-1940 Age (years): 9 Referring Provider: Fransico Him MD, ABSM Height (inches): 62 Interpreting Physician: Fransico Him MD, ABSM Weight (lbs): 192 RPSGT: Jacolyn Reedy BMI: 35 MRN: [id number] Neck Size: 15.50  CLINICAL INFORMATION Sleep Study Type: HST  Indication for sleep study: Excessive Daytime Sleepiness (780.79)  Epworth Sleepiness Score: 7  Most recent polysomnogram dated 02/27/2015 revealed an AHI of 28.4/h and RDI of 28.4/h. Most recent titration study dated 02/27/2015 was optimal at 10cm H2O with an AHI of 21.8/h.  SLEEP STUDY TECHNIQUE A multi-channel overnight portable sleep study was performed. The channels recorded were: nasal airflow, thoracic respiratory movement, and oxygen saturation with a pulse oximetry. Snoring was also monitored.  MEDICATIONS Patient self administered medications include: GABAPENTIN.  SLEEP ARCHITECTURE Patient was studied for 362.5 minutes. The sleep efficiency was 97.8 % and the patient was supine for 99.9%. The arousal index was 0.0 per hour.  RESPIRATORY PARAMETERS The overall AHI was 23.7 per hour, with a central apnea index of 0.0 per hour.  The oxygen nadir was 80% during sleep  CARDIAC DATA Mean heart rate during sleep was 72.0 bpm.  IMPRESSIONS - Moderate obstructive sleep apnea occurred during this study (AHI = 23.7/h). - No significant central sleep apnea occurred during this study (CAI = 0.0/h). - Severe oxygen desaturation was noted during this study (Min O2 = 80%). - Patient snored 5.4% during the sleep.  DIAGNOSIS - Obstructive Sleep Apnea (327.23 [G47.33 ICD-10]) - Nocturnal Hypoxemia (327.26 [G47.36 ICD-10])  RECOMMENDATIONS - Recommend in lab CPAP titration. - Positional therapy avoiding supine position during sleep. - Avoid alcohol, sedatives and other CNS depressants that may worsen sleep apnea and disrupt normal sleep  architecture. - Sleep hygiene should be reviewed to assess factors that may improve sleep quality. - Weight management and regular exercise should be initiated or continued.  [Electronically signed] 09/11/2017 08:12 PM  Fransico Him MD, ABSM Diplomate, American Board of Sleep Medicine

## 2017-09-13 ENCOUNTER — Telehealth: Payer: Self-pay | Admitting: *Deleted

## 2017-09-13 ENCOUNTER — Other Ambulatory Visit: Payer: Medicare Other | Admitting: *Deleted

## 2017-09-13 DIAGNOSIS — G4719 Other hypersomnia: Secondary | ICD-10-CM

## 2017-09-13 DIAGNOSIS — Z79899 Other long term (current) drug therapy: Secondary | ICD-10-CM

## 2017-09-13 NOTE — Progress Notes (Signed)

## 2017-09-13 NOTE — Telephone Encounter (Signed)
-----   Message from Sueanne Margarita, MD sent at 09/11/2017  8:15 PM EDT ----- Please let patient know that they have sleep apnea and recommend CPAP titration. Please set up titration in the sleep lab.

## 2017-09-13 NOTE — Telephone Encounter (Signed)
Informed patient of sleep study results and patient understanding was verbalized. Patient understands her sleep study showed she has sleep apnea and recommend CPAP titration. Pt is aware and agreeable to these results but she would like to think about the titration for a few days.

## 2017-09-14 ENCOUNTER — Telehealth: Payer: Self-pay

## 2017-09-14 DIAGNOSIS — Z79899 Other long term (current) drug therapy: Secondary | ICD-10-CM

## 2017-09-14 LAB — BASIC METABOLIC PANEL
BUN/Creatinine Ratio: 16 (ref 12–28)
BUN: 20 mg/dL (ref 8–27)
CO2: 25 mmol/L (ref 20–29)
Calcium: 10.3 mg/dL (ref 8.7–10.3)
Chloride: 100 mmol/L (ref 96–106)
Creatinine, Ser: 1.23 mg/dL — ABNORMAL HIGH (ref 0.57–1.00)
GFR calc Af Amer: 50 mL/min/{1.73_m2} — ABNORMAL LOW (ref >59)
GFR calc non Af Amer: 43 mL/min/{1.73_m2} — ABNORMAL LOW (ref >59)
Glucose: 197 mg/dL — ABNORMAL HIGH (ref 65–99)
Potassium: 4.8 mmol/L (ref 3.5–5.2)
Sodium: 142 mmol/L (ref 134–144)

## 2017-09-14 MED ORDER — FUROSEMIDE 20 MG PO TABS: 20.0000 mg | ORAL_TABLET | Freq: Every day | ORAL | 3 refills | Status: DC

## 2017-09-14 NOTE — Telephone Encounter (Signed)
Entered in error

## 2017-09-21 ENCOUNTER — Telehealth: Payer: Self-pay | Admitting: Podiatry

## 2017-09-21 NOTE — Telephone Encounter (Signed)
Left message for pt that the NP signed diabetic shoe paperwork and it has to be signed by MD/DO and the note has to come from the doctor also. She needs to get scheduled to see  Dr Felipa Eth and let me know so I can send paperwork to him.

## 2017-09-21 NOTE — Telephone Encounter (Signed)
Pt returned call and is going to call the pcp office and get an appt with Dr Felipa Eth and will let me know when so I can fax paperwork there for him to sign off on pt to get her diabetic shoes.

## 2017-09-23 ENCOUNTER — Encounter: Payer: Self-pay | Admitting: *Deleted

## 2017-09-23 ENCOUNTER — Other Ambulatory Visit: Payer: Medicare Other | Admitting: *Deleted

## 2017-09-23 ENCOUNTER — Telehealth: Payer: Self-pay | Admitting: *Deleted

## 2017-09-23 DIAGNOSIS — Z79899 Other long term (current) drug therapy: Secondary | ICD-10-CM

## 2017-09-23 NOTE — Telephone Encounter (Signed)
-----   Message from Freada Bergeron, [place] sent at 09/23/2017 11:42 AM EDT ----- Regarding: pre cert cpap titration

## 2017-09-23 NOTE — Telephone Encounter (Signed)
Patient is scheduled for CPAP Titration on 10/26/17. Patient understands her titration study will be done at [facility] sleep lab. Patient understands she will receive a letter in a week or so detailing appointment, date, time, and location. Patient understands to call if she does not receive the letter  in a timely manner. Patient agrees with treatment and thanked me for call.

## 2017-09-23 NOTE — Telephone Encounter (Signed)
Patient called back to move forward with the titration.  Titration sent to precert

## 2017-09-23 NOTE — Telephone Encounter (Signed)
Staff message sent to Nancy [name] per [facility] web portal no PA is required. Ok to schedule titration study.

## 2017-09-23 NOTE — Telephone Encounter (Signed)
  Lauralee Evener, CMA  Freada Bergeron, CMA        Per [facility] web portal no PA is required. Decision [id number]

## 2017-09-24 LAB — BASIC METABOLIC PANEL
BUN/Creatinine Ratio: 14 (ref 12–28)
BUN: 14 mg/dL (ref 8–27)
CO2: 23 mmol/L (ref 20–29)
Calcium: 9.6 mg/dL (ref 8.7–10.3)
Chloride: 101 mmol/L (ref 96–106)
Creatinine, Ser: 1.02 mg/dL — ABNORMAL HIGH (ref 0.57–1.00)
GFR calc Af Amer: 62 mL/min/{1.73_m2} (ref >59)
GFR, EST NON AFRICAN AMERICAN: 54 mL/min/{1.73_m2} — AB (ref >59)
Glucose: 167 mg/dL — ABNORMAL HIGH (ref 65–99)
POTASSIUM: 4.2 mmol/L (ref 3.5–5.2)
SODIUM: 139 mmol/L (ref 134–144)

## 2017-09-26 ENCOUNTER — Telehealth: Payer: Self-pay

## 2017-09-26 NOTE — Telephone Encounter (Signed)
Notes recorded by Frederik Schmidt, RN on 09/26/2017 at 9:09 AM EDT Informed patient of results. She verbalized understanding. ------

## 2017-09-26 NOTE — Telephone Encounter (Signed)
-----   Message from Dorothy Spark, MD sent at 09/24/2017  7:31 PM EDT ----- Improved Crea, normal electrolytes

## 2017-10-05 ENCOUNTER — Telehealth: Payer: Self-pay

## 2017-10-05 ENCOUNTER — Telehealth: Payer: Self-pay | Admitting: *Deleted

## 2017-10-05 ENCOUNTER — Encounter: Payer: Self-pay | Admitting: *Deleted

## 2017-10-05 NOTE — Telephone Encounter (Signed)
Spoke with the patient about her upcoming cardiac CT. She has a current BMET and has 1 tablet of Metoprolol to take 1 hour before the test. A letter was made and placed at the front for the patient to pickup with instructions. The patient expressed understanding about the instructions and had no further questions.

## 2017-10-05 NOTE — Telephone Encounter (Signed)
schedule 10/28/17 1030a  Received: Today  Message Contents  [place], [place] Triage        Please send patient instructions and confirm medication instructions

## 2017-10-07 ENCOUNTER — Ambulatory Visit: Payer: Medicare Other | Admitting: Cardiology

## 2017-10-18 ENCOUNTER — Ambulatory Visit (INDEPENDENT_AMBULATORY_CARE_PROVIDER_SITE_OTHER): Payer: Medicare Other | Admitting: Podiatry

## 2017-10-18 DIAGNOSIS — M79674 Pain in right toe(s): Secondary | ICD-10-CM | POA: Diagnosis not present

## 2017-10-18 DIAGNOSIS — M2041 Other hammer toe(s) (acquired), right foot: Secondary | ICD-10-CM | POA: Diagnosis not present

## 2017-10-18 DIAGNOSIS — E1165 Type 2 diabetes mellitus with hyperglycemia: Secondary | ICD-10-CM

## 2017-10-18 DIAGNOSIS — B351 Tinea unguium: Secondary | ICD-10-CM | POA: Diagnosis not present

## 2017-10-18 DIAGNOSIS — M79675 Pain in left toe(s): Secondary | ICD-10-CM

## 2017-10-18 DIAGNOSIS — E118 Type 2 diabetes mellitus with unspecified complications: Secondary | ICD-10-CM

## 2017-10-18 DIAGNOSIS — M2042 Other hammer toe(s) (acquired), left foot: Secondary | ICD-10-CM | POA: Diagnosis not present

## 2017-10-18 DIAGNOSIS — IMO0002 Reserved for concepts with insufficient information to code with codable children: Secondary | ICD-10-CM

## 2017-10-26 ENCOUNTER — Ambulatory Visit (HOSPITAL_BASED_OUTPATIENT_CLINIC_OR_DEPARTMENT_OTHER): Payer: Medicare Other | Attending: Cardiology | Admitting: Cardiology

## 2017-10-26 VITALS — Ht 62.0 in | Wt 175.0 lb

## 2017-10-26 DIAGNOSIS — G4733 Obstructive sleep apnea (adult) (pediatric): Secondary | ICD-10-CM | POA: Diagnosis not present

## 2017-10-26 DIAGNOSIS — G4719 Other hypersomnia: Secondary | ICD-10-CM

## 2017-10-26 NOTE — Progress Notes (Signed)
Subjective: 77 year old female presents the office with concerns of pain to her nose most of her big toes on the medial aspect where she is ingrown toenail this is been a chronic issue for her.  She denies any swelling, redness, drainage or pus to the toenail sites.  She is asked for nails be trimmed today.  She is also avoiding her diabetic shoes.  The day but no recent injury or changes.  No swelling. Denies any systemic complaints such as fevers, chills, nausea, vomiting. No acute changes since last appointment, and no other complaints at this time.   Objective: AAO x3, NAD DP/PT pulses palpable bilaterally, CRT less than 3 seconds Sensation decreased with Simms Weinstein monofilament Nails are hypertrophic, dystrophic elongated x10.  There is ingrowing on both medial lateral aspects of the hallux toenails.  There is no edema, erythema, drainage or pus.  Subjectively there is tenderness nails 1-5 bilaterally. Overall she describes some generalized foot pain but there is no specific area of tenderness.  Extensor, flexor tendons appear to be intact.  Achilles tendon, plantar fascial intact No open lesions or pre-ulcerative lesions.  No pain with calf compression, swelling, warmth, erythema  Assessment: Symptomatic onychomycosis/ingrown toenails  Plan: -All treatment options discussed with the patient including all alternatives, risks, complications.  -Nails sharply debrided x10 without any complications or bleeding.  After debridement there is resolution of the pain from ingrowing of the nails.  Discussed partial nail avulsion symptoms continue. -Betha dispensed diabetic shoes. -Patient encouraged to call the office with any questions, concerns, change in symptoms.   Return in about 3 months (around 01/18/2018).  Trula Slade DPM

## 2017-10-28 ENCOUNTER — Encounter: Payer: Medicare Other | Admitting: *Deleted

## 2017-10-28 ENCOUNTER — Encounter (HOSPITAL_COMMUNITY): Payer: Self-pay

## 2017-10-28 ENCOUNTER — Ambulatory Visit (HOSPITAL_COMMUNITY)
Admission: RE | Admit: 2017-10-28 | Discharge: 2017-10-28 | Disposition: A | Discharge status: Home / Self Care | Payer: Medicare Other | Source: Ambulatory Visit | Attending: Cardiology | Admitting: Cardiology

## 2017-10-28 DIAGNOSIS — K449 Diaphragmatic hernia without obstruction or gangrene: Secondary | ICD-10-CM | POA: Diagnosis not present

## 2017-10-28 DIAGNOSIS — R079 Chest pain, unspecified: Secondary | ICD-10-CM | POA: Diagnosis not present

## 2017-10-28 DIAGNOSIS — R918 Other nonspecific abnormal finding of lung field: Secondary | ICD-10-CM | POA: Diagnosis not present

## 2017-10-28 DIAGNOSIS — Z006 Encounter for examination for normal comparison and control in clinical research program: Secondary | ICD-10-CM

## 2017-10-28 MED ORDER — METOPROLOL TARTRATE 5 MG/5ML IV SOLN
INTRAVENOUS | Status: AC
  Administered 2017-10-28: 5 mg via INTRAVENOUS
  Filled 2017-10-28: qty 10

## 2017-10-28 MED ORDER — METOPROLOL TARTRATE 5 MG/5ML IV SOLN
5.0000 mg | INTRAVENOUS | Status: DC | PRN
  Administered 2017-10-28: 5 mg via INTRAVENOUS
  Filled 2017-10-28: qty 5

## 2017-10-28 MED ORDER — IOPAMIDOL (ISOVUE-370) INJECTION 76%
INTRAVENOUS | Status: AC
  Filled 2017-10-28: qty 100

## 2017-10-28 MED ORDER — NITROGLYCERIN 0.4 MG SL SUBL
SUBLINGUAL_TABLET | SUBLINGUAL | Status: AC
  Administered 2017-10-28: 0.8 mg via SUBLINGUAL
  Filled 2017-10-28: qty 2

## 2017-10-28 MED ORDER — NITROGLYCERIN 0.4 MG SL SUBL
0.8000 mg | SUBLINGUAL_TABLET | Freq: Once | SUBLINGUAL | Status: AC
  Administered 2017-10-28: 0.8 mg via SUBLINGUAL
  Filled 2017-10-28: qty 25

## 2017-10-28 NOTE — Research (Signed)
CADFEM Informed Consent           Subject Name:  Nancy [name]   Subject met inclusion and exclusion criteria.  The informed consent form, study requirements and expectations were reviewed with the subject and questions and concerns were addressed prior to the signing of the consent form.  The subject verbalized understanding of the trial requirements.  The subject agreed to participate in the CADFEM trial and signed the informed consent.  The informed consent was obtained prior to performance of any protocol-specific procedures for the subject.  A copy of the signed informed consent was given to the subject and a copy was placed in the subject's medical record.   [place] Chalmers, Research Assistant  10/28/2017 09:35 a.m.

## 2017-10-30 NOTE — Procedures (Signed)
   Patient Name: Nancy [name], Kurdziel Date: 10/26/2017 Gender: Female D.O.B: [birth year] Age (years): 26 Referring Provider: Fransico Him MD, ABSM Height (inches): 62 Interpreting Physician: Fransico Him MD, ABSM Weight (lbs): 175 RPSGT: Laren Everts BMI: 32 MRN: [id number] Neck Size: 15.50  CLINICAL INFORMATION  The patient is referred for a CPAP titration to treat sleep apnea  SLEEP STUDY TECHNIQUE  As per the AASM Manual for the Scoring of Sleep and Associated Events v2.3 (April 2016) with a hypopnea requiring 4% desaturations. The channels recorded and monitored were frontal, central and occipital EEG, electrooculogram (EOG), submentalis EMG (chin), nasal and oral airflow, thoracic and abdominal wall motion, anterior tibialis EMG, snore microphone, electrocardiogram, and pulse oximetry. Continuous positive airway pressure (CPAP) was initiated at the beginning of the study and titrated to treat sleep-disordered breathing.  MEDICATIONS  Medications self-administered by patient taken the night of the study : GABAPENTIN  TECHNICIAN COMMENTS  Comments added by technician: Patient was restless all through the night. Patient had difficulty initiating sleep. Comments added by scorer: N/A  RESPIRATORY PARAMETERS  Optimal PAP Pressure (cm):12 AHI at Optimal Pressure (/hr):0.0 Overall Minimal O2 (%):88.0 Supine % at Optimal Pressure (%):100 Minimal O2 at Optimal Pressure (%):93.0  SLEEP ARCHITECTURE  The study was initiated at 10:45:44 PM and ended at 4:59:02 AM. Sleep onset time was 60.6 minutes and the sleep efficiency was 40.2%%. The total sleep time was 150 minutes. The patient spent 11.7%% of the night in stage N1 sleep, 60.7%% in stage N2 sleep, 0.0%% in stage N3 and 27.7% in REM.Stage REM latency was 42.0 minutes Wake after sleep onset was 162.7. Alpha intrusion was absent. Supine sleep was 27.67%.  CARDIAC DATA  The 2 lead EKG demonstrated sinus rhythm. The mean heart  rate was 63.3 beats per minute. Other EKG findings include: PACs  LEG MOVEMENT DATA  The total Periodic Limb Movements of Sleep (PLMS) were 0. The PLMS index was 0.0. A PLMS index of <15 is considered normal in adults.  IMPRESSIONS  - The optimal PAP pressure was 12 cm of water. - Central sleep apnea was not noted during this titration (CAI = 0.4/h). - Mild oxygen desaturations were observed during this titration (min O2 = 88.0%). - The patient snored with soft snoring volume during this titration study. - PACs were observed during this study. - Clinically significant periodic limb movements were not noted during this study. Arousals associated with PLMs were rare.  DIAGNOSIS   - Obstructive Sleep Apnea (327.23 [G47.33 ICD-10])  RECOMMENDATIONS  - Trial of CPAP therapy on 12 cm H2O with a Small size Resmed Full Face Mask AirFit F20 mask and heated humidification. - Avoid alcohol, sedatives and other CNS depressants that may worsen sleep apnea and disrupt normal sleep architecture. - Sleep hygiene should be reviewed to assess factors that may improve sleep quality. - Weight management and regular exercise should be initiated or continued. - Return to Sleep Center for re-evaluation after 10 weeks of therapy  [Electronically signed] 10/30/2017 08:41 PM  Fransico Him MD, ABSM Diplomate, American Board of Sleep Medicine

## 2017-10-31 ENCOUNTER — Encounter: Payer: Self-pay | Admitting: Internal Medicine

## 2017-10-31 ENCOUNTER — Ambulatory Visit (INDEPENDENT_AMBULATORY_CARE_PROVIDER_SITE_OTHER): Payer: Medicare Other | Admitting: Internal Medicine

## 2017-10-31 VITALS — BP 126/62 | HR 77 | Ht 62.0 in | Wt 188.6 lb

## 2017-10-31 DIAGNOSIS — E1165 Type 2 diabetes mellitus with hyperglycemia: Secondary | ICD-10-CM

## 2017-10-31 DIAGNOSIS — E1142 Type 2 diabetes mellitus with diabetic polyneuropathy: Secondary | ICD-10-CM

## 2017-10-31 MED ORDER — GLUCOSE BLOOD VI STRP: ORAL_STRIP | 12 refills | Status: DC

## 2017-10-31 NOTE — Patient Instructions (Addendum)
-   Check sugar twice a day (fasting and bedtime)  - Bring your meter/sugar log on next visit  - Continue Metformin 1000 mg Twice a day - Glipizide 5 mg Twice a day with meals - Continue Januvia 100 mg daily   - HOW TO TREAT LOW BLOOD SUGARS (Blood sugar LESS THAN 70 MG/DL)  Please follow the RULE OF 15 for the treatment of hypoglycemia treatment (when your (blood sugars are less than 70 mg/dL)    STEP 1: Take 15 grams of carbohydrates when your blood sugar is low, which includes:   3-4 GLUCOSE TABS  OR  3-4 OZ OF JUICE OR REGULAR SODA OR  ONE TUBE OF GLUCOSE GEL     STEP 2: RECHECK blood sugar in 15 MINUTES STEP 3: If your blood sugar is still low at the 15 minute recheck --> then, go back to STEP 1 and treat AGAIN with another 15 grams of carbohydrates.  Choose healthy, lower carb lower calorie snacks: toss salad, cooked vegetables, cottage cheese, peanut butter, low fat cheese / string cheese, lower sodium deli meat, tuna salad or chicken salad  

## 2017-10-31 NOTE — Progress Notes (Signed)
Name: Nancy [name]  MRN/ DOB: [id number], 09-29-1940   Age/ Sex: 77 y.o., female    PCP: Dianna Rossetti, NP   Reason for Endocrinology Evaluation: Type 2 Diabetes Mellitus     Date of Initial Endocrinology Visit: 10/31/2017     PATIENT IDENTIFIER: Nancy [name] is a 77 y.o. female with a past medical history of HTN, Gout, Hyperlipidemia, and T2 DM. The patient presented for initial ndocrinology clinic visit on 10/31/2017 for consultative assistance with her diabetes management.    HPI:   Diagnosed with DM ~ 10 yrs Prior Medications tried: Other then current  Hypoglycemia episodes : None          Hemoglobin A1c has ranged from 7.3% in 09/2015, peaking at 8.6% in 08/2015. Currently checking blood sugars 1 x / week,  before breakfast .  Patient required assistance for hypoglycemia: No Patient has required hospitalization within the last 1 year from hyper or hypoglycemia:   In terms of diet, the patient drinks sweet tea and juice. Eats two meals a day  and snacks at night .     HOME DIABETES REGIMEN: Metformin 1000 mg BID - usually takes one  Januvia 100 mg daily  Glipizide 5 mg BID - takes one    Statin: Yes ACE-I/ARB: Yes Prior Diabetic Education: Long time a go    Neurosurgeon SUMMARY: Did not bring meter    DIABETIC COMPLICATIONS: Microvascular complications:   Neuropathy  Denies: retinopathy, nephropathy  Last eye exam: Completed Summer 2019   Macrovascular complications:    Denies: CAD, PVD, CVA   PAST HISTORY: Past Medical History:  Past Medical History:  Diagnosis Date  . Allergy   . Breast cyst    left breast  . Chest pain   . Complication of anesthesia 1986   slow to awaken after cholecystectomy  . Diastolic dysfunction    with mild pulmonary HTN by echo 07/2011  . Gastric ulcer   . GERD (gastroesophageal reflux disease)   . Gout   . Hyperlipidemia   . Hypertension   . 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  Osteoarthritis   . Osteopenia   . Shortness of breath dyspnea    with activity  . Spine deformity    lower spine  . Type II diabetes mellitus ([place])    Past Surgical History:  Past Surgical History:  Procedure Laterality Date  . ABDOMINAL ADHESION SURGERY    . ABDOMINAL HYSTERECTOMY    . ANUS SURGERY     for torn tissue  . APPENDECTOMY    . BREAST BIOPSY    . BREAST LUMPECTOMY     left  . CHOLECYSTECTOMY  1986  . COLONOSCOPY    . ESOPHAGOGASTRODUODENOSCOPY (EGD) WITH PROPOFOL N/A 07/18/2013   Procedure: ESOPHAGOGASTRODUODENOSCOPY (EGD) WITH PROPOFOL;  Surgeon: Arta Silence, MD;  Location: [facility];  Service: Endoscopy;  Laterality: N/A;  . EUS N/A 07/18/2013   Procedure: ESOPHAGEAL ENDOSCOPIC ULTRASOUND (EUS) RADIAL;  Surgeon: Arta Silence, MD;  Location: [facility];  Service: Endoscopy;  Laterality: N/A;  . IR GENERIC HISTORICAL  10/30/2015   IR US GUIDE VASC ACCESS RIGHT 10/30/2015 Saverio Danker, PA-C [facility]  . IR GENERIC HISTORICAL  10/30/2015   IR FLUORO GUIDE CV LINE RIGHT 10/30/2015 Saverio Danker, PA-C [facility]  . LUMBAR WOUND DEBRIDEMENT N/A 10/01/2015   Procedure: LUMBAR WOUND DEBRIDEMENT;  Surgeon: Melina Schools, MD;  Location: [place];  Service: Orthopedics;  Laterality: N/A;  . LUMBAR  WOUND DEBRIDEMENT N/A 10/15/2015   Procedure: [facility] OUT AND CLOSURE OF BACK WOUND;  Surgeon: Melina Schools, MD;  Location: [place];  Service: Orthopedics;  Laterality: N/A;  . MASS EXCISION Left 07/24/2015   Procedure: EXCISION MASS LEFT LONG FINGER;  Surgeon: Leanora Cover, MD;  Location: [place];  Service: Orthopedics;  Laterality: Left;  . SPINAL FUSION N/A 09/17/2015   Procedure: FUSION POSTERIOR SPINAL MULTILEVEL L4- S1;  Surgeon: Melina Schools, MD;  Location: [place];  Service: Orthopedics;  Laterality: N/A;  . TRANSFORAMINAL LUMBAR INTERBODY FUSION (TLIF) WITH PEDICLE SCREW FIXATION 1 LEVEL N/A 09/17/2015   Procedure: TRANSFORAMINAL LUMBAR INTERBODY FUSION  (TLIF) WITH PEDICLE SCREW FIXATION 1 LEVEL , Lumbar 4-5;  Surgeon: Melina Schools, MD;  Location: [place];  Service: Orthopedics;  Laterality: N/A;      Social History:  reports that she has never smoked. She has never used smokeless tobacco. She reports that she does not drink alcohol or use drugs. Family History:  Family History  Problem Relation Age of Onset  . Heart disease Mother   . Cancer Sister        ovarian     HOME MEDICATIONS: Allergies as of 10/31/2017      Reactions   Morphine And Related Swelling   10/02/15 - given Toradol + morphine and RN noted tongue swelling - given Epi + Benadryl Has had morphine in past w/o problem   Toradol [ketorolac Tromethamine] Swelling   10/02/15 - given Toradol + morphine and RN noted tongue swelling - given Epi + Benadryl Has taken ibuprofen in past w/o problem   Codeine Other (See Comments)   headache      Medication List        Accurate as of 10/31/17 10:24 AM. Always use your most recent med list.          allopurinol 100 MG tablet Commonly known as:  ZYLOPRIM Take 200 mg by mouth daily.   amLODipine 10 MG tablet Commonly known as:  NORVASC Take 10 mg by mouth every morning.   aspirin EC 81 MG tablet Take 81 mg by mouth daily.   COLCRYS 0.6 MG tablet Generic drug:  colchicine Take 0.6 mg by mouth daily as needed (Gout).   diclofenac sodium 1 % Gel Commonly known as:  VOLTAREN Apply 2 g topically 4 (four) times daily. Rub into affected area of foot 2 to 4 times daily   DUREZOL 0.05 % Emul Generic drug:  Difluprednate   furosemide 20 MG tablet Commonly known as:  LASIX Take 1 tablet (20 mg total) by mouth daily.   gabapentin 300 MG capsule Commonly known as:  NEURONTIN Take 300 mg by mouth 3 (three) times daily.   glipiZIDE 5 MG tablet Commonly known as:  GLUCOTROL Take 5 mg by mouth daily before breakfast.   JANUVIA 100 MG tablet Generic drug:  sitaGLIPtin TAKE 1 TABLET BY MOUTH ONCE DAILY FOR 90 DAYS     losartan 50 MG tablet Commonly known as:  COZAAR Take 50 mg by mouth daily.   metFORMIN 1000 MG tablet Commonly known as:  GLUCOPHAGE Take 1,000 mg by mouth 2 (two) times daily with a meal.   metoprolol tartrate 50 MG tablet Commonly known as:  LOPRESSOR Take 1 tablet (50 mg total) 1 hour prior to Cardiac CT   omeprazole 20 MG capsule Commonly known as:  PRILOSEC Take 20 mg by mouth daily.   potassium chloride SA 20 MEQ tablet Commonly known as:  K-DUR,KLOR-CON Take 1 tablet (20 mEq total) by mouth daily.   predniSONE 20 MG tablet Commonly known as:  DELTASONE Take 20 mg by mouth as needed.        ALLERGIES: Allergies  Allergen Reactions  . Morphine And Related Swelling    10/02/15 - given Toradol + morphine and RN noted tongue swelling - given Epi + Benadryl Has had morphine in past w/o problem  . Toradol [Ketorolac Tromethamine] Swelling    10/02/15 - given Toradol + morphine and RN noted tongue swelling - given Epi + Benadryl Has taken ibuprofen in past w/o problem  . Codeine Other (See Comments)    headache     REVIEW OF SYSTEMS: A comprehensive ROS was conducted with the patient and is negative except as per HPI and below:  Review of Systems  Constitutional: Negative for weight loss.  HENT: Negative for congestion and sore throat.   Eyes: Negative for blurred vision and pain.  Respiratory: Negative for cough and shortness of breath.   Cardiovascular: Positive for palpitations. Negative for chest pain.  Gastrointestinal: Negative for abdominal pain and nausea.  Musculoskeletal: Positive for joint pain.  Skin: Negative for rash.  Neurological: Positive for tingling. Negative for tremors.  Endo/Heme/Allergies: Negative.   Psychiatric/Behavioral: Negative for depression.      OBJECTIVE:   VITAL SIGNS: BP 126/62 (BP Location: Left Arm)   Pulse 77   Ht 5\' 2"  (1.575 m)   Wt 85.5 kg   SpO2 94%   BMI 34.50 kg/m    PHYSICAL EXAM:  General: Pt appears  well and is in NAD  Hydration: Well-hydrated with moist mucous membranes and good skin turgor  HEENT: Head: Unremarkable with good dentition. Oropharynx clear without exudate.  Eyes: External eye exam normal without stare, lid lag or exophthalmos.  EOM intact.  PERRL.  Neck: General: Supple without adenopathy or carotid bruits. Thyroid: Thyroid size normal.  No goiter or nodules appreciated. No thyroid bruit.  Lungs: Clear with good BS bilat with no rales, rhonchi, or wheezes  Heart: RRR with normal S1 and S2 and no gallops; no murmurs; no rub  Abdomen: Normoactive bowel sounds, soft, nontender, without masses or organomegaly palpable  Extremities:  Lower extremities - No pretibial edema. No lesions.  Skin: Normal texture and temperature to palpation. No rash noted. No Acanthosis nigricans/skin tags. No lipohypertrophy.  Neuro: MS is good with appropriate affect, pt is alert and Ox3    DM foot exam: (10/31/2017)  The skin of the feet is intact without sores or ulcerations. The pedal pulses are 2+ on right and 2+ on left. The sensation is intact to a screening 5.07, 10 gram monofilament bilaterally   DATA REVIEWED: A1c: 7.7% (08/2017)   BUN/Cr 22/1.12 mg/dL GFR 57       ASSESSMENT / PLAN / RECOMMENDATIONS:   1) Type 2 Diabetes Mellitus, Sub-optimally controlled, With microvascular complications - Most recent A1c of 7.7 %. Goal A1c < 7.0 %.    Plan: GENERAL:  Sub-optimally controlled diabetes due to medication non-adherence and dietary indiscretions.   Counseled about the importance of glycemic control in avoiding complications such as blindness, renal failure and worsening neuropathy.   Counseled about the importance of medication compliance as well as making the right dietary choices. Patient to avoid sugar-sweetened beverages and sweets (per daughter likes pound cakes)  I will not make any changes to her medication regimen at this time because she is not taking them  consistently and with no glucose  data , its difficult to make any decisions.  We discussed risk of hypoglycemia with glipizide and the need to be taken with meals.   MEDICATIONS:  Continue Metfomrin 1000 mg BID  Continue Glipizide 5 mg BID with meals   Continue Januvia 100 mg daily   EDUCATION / INSTRUCTIONS:  BG monitoring instructions: Patient is instructed to check her blood sugars 2 times a day,before meals and at bedtime.  Call [facility] if: BG persistently < 70 or > 300. . I reviewed the Rule of 15 for the treatment of hypoglycemia in detail with the patient. Literature supplied.   2) Diabetic complications:   Eye: Does not have known diabetic retinopathy. Last eye exam was   Neuro/ Feet: Does  have known diabetic peripheral neuropathy.  Renal: Patient does  have known baseline CKD. She is on an ACEI/ARB at present.   3) Lipids: Patient is not on a statin.    4) Hypertension: She is at goal of < 140/90 mmHg.    F/u in 3 weeks    Signed electronically by: Mack Guise, MD  [facility]  [facility] [address] [place], [place] [address] Phone: [phone] FAX: [phone]   CC: Dianna Rossetti, [place] [employer] [address] [place] [address] Phone: [phone]  Fax: [phone]    Return to Endocrinology clinic as below: Future Appointments  Date Time Provider [place]  01/17/2018 10:45 AM Trula Slade, DPM [facility]

## 2017-11-01 ENCOUNTER — Telehealth: Payer: Self-pay | Admitting: *Deleted

## 2017-11-01 DIAGNOSIS — G4719 Other hypersomnia: Secondary | ICD-10-CM

## 2017-11-01 DIAGNOSIS — I5032 Chronic diastolic (congestive) heart failure: Secondary | ICD-10-CM

## 2017-11-01 NOTE — Telephone Encounter (Signed)
-----   Message from Sueanne Margarita, MD sent at 10/30/2017  8:46 PM EDT ----- Please let patient know that they had a successful PAP titration and let DME know that orders are in EPIC.  Please set up 10 week OV with me.

## 2017-11-01 NOTE — Telephone Encounter (Signed)
Informed patient of sleep study results and patient understanding was verbalized. Patient understands her sleep study showed they had a successful PAP titration and orders are in Epic. Upon patient request DME selection is [facility]. Patient understands she will be contacted by [facility] to set up her cpap. Patient understands to call if [facility] does not contact her with new setup in a timely manner. Patient understands they will be called once confirmation has been received from [facility] that they have received their new machine to schedule 10 week follow up appointment.  [facility] notified of new cpap order  Please add to airview Patient was grateful for the call and thanked me.

## 2017-11-04 ENCOUNTER — Telehealth: Payer: Self-pay

## 2017-11-04 NOTE — Telephone Encounter (Signed)
The patient has been notified of the result and verbalized understanding.  All questions (if any) were answered. Sarina Ill, RN 11/04/2017 2:18 PM

## 2017-11-04 NOTE — Telephone Encounter (Signed)
Notes recorded by Sueanne Margarita, MD on 10/30/2017 at 10:15 AM EDT Coronary CTA showed small hiatal hernia and mild scaring in RML. Coronary calcium score is low at 6.5 and no obstructive CAD. She is low risk for furture cardiac events

## 2017-11-04 NOTE — Telephone Encounter (Signed)
Left message to call back  

## 2017-11-17 NOTE — Telephone Encounter (Signed)
Patient has a 10 week follow up appointment scheduled for .01/18/2018. Patient understands she needs to keep this appointment for insurance compliance. Patient was grateful for the call and thanked me.

## 2017-11-21 NOTE — Progress Notes (Signed)
Name: Nancy [name]  Age/ Sex: 77 y.o., female   MRN/ DOB: [id number], 1940-03-31     PCP: Dianna Rossetti, NP   Reason for Endocrinology Evaluation: Type 2 Diabetes Mellitus  Initial Endocrine Consultative Visit: 10/31/2017    PATIENT IDENTIFIER: Nancy [name] is a 77 y.o. female with a past medical history of HTN, Gout, Hyperlipidemia, and T2 DM . The patient has followed with Endocrinology clinic since 10/31/17 for consultative assistance with management of her diabetes.  DIABETIC HISTORY:  Nancy [name] was diagnosed with T2DM ~ in 2009, she has been on oral glycemic agents since her diagnosis. Has not been on insulin prior to her presentation here. Her hemoglobin A1c has ranged from 7.3% in 09/2015, peaking at 8.6% in 08/2015.   SUBJECTIVE:   During the last visit (10/31/2017): Her A1c was 7.7%. We did not make any changes to her regimen as she was not taking them as prescribed.   Today (11/22/2017): Nancy [name]  She checks her blood sugars 3 times daily. The patient has not had hypoglycemic episodes since the last clinic visit.Otherwise, the patient has not required any recent emergency interventions for hypoglycemia and has not had recent hospitalizations secondary to hyper or hypoglycemic episodes.  She has been doing better in checking her glucose, taking her medications and avoids snacking except for the occasional late night snacks which is evident on her meter.   Pt did state that at times , she will take Glipizide at bedtime with metformin instead of with supper.     ROS: As per HPI and as detailed below: Review of Systems  Constitutional: Negative for fever and weight loss.  HENT: Negative for congestion and sore throat.   Respiratory: Negative for cough and shortness of breath.   Cardiovascular: Positive for chest pain. Negative for palpitations.       Chest pain when she was  walking, but this resolved as she continued to walk.   Gastrointestinal: Negative for diarrhea and nausea.  Skin: Negative for rash.      HOME DIABETES REGIMEN:    Metfomrin 1000 mg BID  Glipizide 5 mg BID with meals   Januvia 100 mg daily     METER DOWNLOAD SUMMARY: Date range evaluated: 10/13-11/12/19 Fingerstick Blood Glucose Tests = 20 Average Number Tests/Day = 0.6 Overall Mean FS Glucose = 133 Standard Deviation = 43  BG Ranges: Low = 74 High = 247   Hypoglycemic Events/30 Days: BG < 50 = 0 Episodes of symptomatic severe hypoglycemia = 0      HISTORY:  Past Medical History:  Past Medical History:  Diagnosis Date  . Allergy   . Breast cyst    left breast  . Chest pain   . Complication of anesthesia 1986   slow to awaken after cholecystectomy  . Diastolic dysfunction    with mild pulmonary HTN by echo 07/2011  . Gastric ulcer   . GERD (gastroesophageal reflux disease)   . Gout   . Hyperlipidemia   . Hypertension   . 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 Osteoarthritis   . Osteopenia   . Shortness of breath dyspnea    with activity  . Spine deformity    lower spine  . Type II diabetes mellitus ([place])    Past Surgical History:  Past Surgical History:  Procedure Laterality Date  . ABDOMINAL ADHESION SURGERY    . ABDOMINAL HYSTERECTOMY    . ANUS SURGERY  for torn tissue  . APPENDECTOMY    . BREAST BIOPSY    . BREAST LUMPECTOMY     left  . CHOLECYSTECTOMY  1986  . COLONOSCOPY    . ESOPHAGOGASTRODUODENOSCOPY (EGD) WITH PROPOFOL N/A 07/18/2013   Procedure: ESOPHAGOGASTRODUODENOSCOPY (EGD) WITH PROPOFOL;  Surgeon: Arta Silence, MD;  Location: [facility];  Service: Endoscopy;  Laterality: N/A;  . EUS N/A 07/18/2013   Procedure: ESOPHAGEAL ENDOSCOPIC ULTRASOUND (EUS) RADIAL;  Surgeon: Arta Silence, MD;  Location: [facility];  Service: Endoscopy;  Laterality: N/A;  . IR GENERIC HISTORICAL  10/30/2015   IR US GUIDE VASC ACCESS RIGHT  10/30/2015 Saverio Danker, PA-C [facility]  . IR GENERIC HISTORICAL  10/30/2015   IR FLUORO GUIDE CV LINE RIGHT 10/30/2015 Saverio Danker, PA-C [facility]  . LUMBAR WOUND DEBRIDEMENT N/A 10/01/2015   Procedure: LUMBAR WOUND DEBRIDEMENT;  Surgeon: Melina Schools, MD;  Location: [place];  Service: Orthopedics;  Laterality: N/A;  . LUMBAR WOUND DEBRIDEMENT N/A 10/15/2015   Procedure: [facility] OUT AND CLOSURE OF BACK WOUND;  Surgeon: Melina Schools, MD;  Location: [place];  Service: Orthopedics;  Laterality: N/A;  . MASS EXCISION Left 07/24/2015   Procedure: EXCISION MASS LEFT LONG FINGER;  Surgeon: Leanora Cover, MD;  Location: [place];  Service: Orthopedics;  Laterality: Left;  . SPINAL FUSION N/A 09/17/2015   Procedure: FUSION POSTERIOR SPINAL MULTILEVEL L4- S1;  Surgeon: Melina Schools, MD;  Location: [place];  Service: Orthopedics;  Laterality: N/A;  . TRANSFORAMINAL LUMBAR INTERBODY FUSION (TLIF) WITH PEDICLE SCREW FIXATION 1 LEVEL N/A 09/17/2015   Procedure: TRANSFORAMINAL LUMBAR INTERBODY FUSION (TLIF) WITH PEDICLE SCREW FIXATION 1 LEVEL , Lumbar 4-5;  Surgeon: Melina Schools, MD;  Location: [place];  Service: Orthopedics;  Laterality: N/A;    Social History:  reports that she has never smoked. She has never used smokeless tobacco. She reports that she does not drink alcohol or use drugs. Family History:  Family History  Problem Relation Age of Onset  . Heart disease Mother   . Cancer Sister        ovarian     HOME MEDICATIONS: Allergies as of 11/22/2017      Reactions   Morphine And Related Swelling   10/02/15 - given Toradol + morphine and RN noted tongue swelling - given Epi + Benadryl Has had morphine in past w/o problem   Toradol [ketorolac Tromethamine] Swelling   10/02/15 - given Toradol + morphine and RN noted tongue swelling - given Epi + Benadryl Has taken ibuprofen in past w/o problem   Codeine Other (See Comments)   headache      Medication List        Accurate as  of 11/22/17 11:34 AM. Always use your most recent med list.          allopurinol 100 MG tablet Commonly known as:  ZYLOPRIM Take 200 mg by mouth daily.   amLODipine 10 MG tablet Commonly known as:  NORVASC Take 10 mg by mouth every morning.   aspirin EC 81 MG tablet Take 81 mg by mouth daily.   COLCRYS 0.6 MG tablet Generic drug:  colchicine Take 0.6 mg by mouth daily as needed (Gout).   DUREZOL 0.05 % Emul Generic drug:  Difluprednate   furosemide 20 MG tablet Commonly known as:  LASIX Take 1 tablet (20 mg total) by mouth daily.   gabapentin 300 MG capsule Commonly known as:  NEURONTIN Take 300 mg by mouth 3 (three) times daily.  glipiZIDE 5 MG tablet Commonly known as:  GLUCOTROL Take 5 mg by mouth daily before breakfast.   glucose blood test strip Use as instructed   JANUVIA 100 MG tablet Generic drug:  sitaGLIPtin TAKE 1 TABLET BY MOUTH ONCE DAILY FOR 90 DAYS   losartan 50 MG tablet Commonly known as:  COZAAR Take 50 mg by mouth daily.   metFORMIN 1000 MG tablet Commonly known as:  GLUCOPHAGE Take 1,000 mg by mouth 2 (two) times daily with a meal.   metoprolol succinate 100 MG 24 hr tablet Commonly known as:  TOPROL-XL Take 100 mg by mouth daily. Take with or immediately following a meal.   omeprazole 20 MG capsule Commonly known as:  PRILOSEC Take 20 mg by mouth daily.   potassium chloride SA 20 MEQ tablet Commonly known as:  K-DUR,KLOR-CON Take 1 tablet (20 mEq total) by mouth daily.        OBJECTIVE:   Vital Signs: BP (!) 142/78 (BP Location: Right Arm, Patient Position: Sitting, Cuff Size: Normal)   Pulse 73   Ht 5\' 2"  (1.575 m)   Wt 192 lb 3.2 oz (87.2 kg)   SpO2 95%   BMI 35.15 kg/m   Wt Readings from Last 3 Encounters:  11/22/17 192 lb 3.2 oz (87.2 kg)  10/31/17 188 lb 9.6 oz (85.5 kg)  10/26/17 175 lb (79.4 kg)     Exam: General: Pt appears well and is in NAD  Lungs: Clear with good BS bilat with no rales, rhonchi, or  wheezes  Heart: RRR with normal S1 and S2 and no gallops; no murmurs; no rub  Extremities: Trace pretibial edema.   Skin: Normal texture and temperature to palpation. No rash noted.  Neuro: MS is good with appropriate affect, pt is alert and Ox3    DM foot exam:(10/31/2017)  The skin of the feet is intact without sores or ulcerations. The pedal pulses are 2+ on right and 2+ on left. The sensation is intact to a screening 5.07, 10 gram monofilament bilaterally           DATA REVIEWED:   Lab Results  Component Value Date   CREATININE 1.02 (H) 09/23/2017    Results for YAFFA, SECKMAN (MRN [id number]) as of 11/22/2017 11:38  Ref. Range 11/22/2017 11:01  Hemoglobin A1C Latest Ref Range: 4.0 - 5.6 % 6.1 (A)     ASSESSMENT / PLAN / RECOMMENDATIONS:   1) Type 2 Diabetes Mellitus, Sub-optimally controlled, With microvascular complications - Most recent A1c of 6.1 %. Goal A1c < 7.0 %.    Plan: - Praised patient on taking medications as prescribed and avoiding snacks as much as possible. - She was advised to take glipizide with a meal and if she forgets to take it with supper, she should NOT take it at bedtime due to risk of hypoglycemia.  - Pt to notify us with s/s of hypoglycemia but at this time there;s no evidence of it.    MEDICATIONS:  Metfomrin 1000 mg BID  Glipizide 5 mg BID with meals   Januvia 100 mg daily   EDUCATION / INSTRUCTIONS:  BG monitoring instructions: Patient is instructed to check her blood sugars 2 times a day, fasting and bedtime.  Call [place] [facility] if: BG persistently < 70 or > 300. . I reviewed the Rule of 15 for the treatment of hypoglycemia in detail with the patient. Literature supplied.     F/U in 3 months   Signed electronically by: Maretta Bees  Nena Jordan, MD  [facility]  [facility] [address] [place],  [address] Phone: [phone] FAX: [phone]   CC:    Dianna Rossetti, [place] [employer] [address] [address] Phone: [phone]  Fax: [phone]  Return to Endocrinology clinic as below: Future Appointments  Date Time Provider [place]  01/17/2018 10:45 AM Trula Slade, DPM [facility]  01/18/2018  9:40 AM Sueanne Margarita, MD [facility]  02/23/2018 10:30 AM Graceland Wachter, Melanie Crazier, MD [facility] None

## 2017-11-22 ENCOUNTER — Encounter: Payer: Self-pay | Admitting: Internal Medicine

## 2017-11-22 ENCOUNTER — Ambulatory Visit (INDEPENDENT_AMBULATORY_CARE_PROVIDER_SITE_OTHER): Payer: Medicare Other | Admitting: Internal Medicine

## 2017-11-22 VITALS — BP 142/78 | HR 73 | Ht 62.0 in | Wt 192.2 lb

## 2017-11-22 DIAGNOSIS — E1165 Type 2 diabetes mellitus with hyperglycemia: Secondary | ICD-10-CM | POA: Diagnosis not present

## 2017-11-22 LAB — POCT GLYCOSYLATED HEMOGLOBIN (HGB A1C): Hemoglobin A1C: 6.1 % — AB (ref 4.0–5.6)

## 2017-11-22 NOTE — Patient Instructions (Signed)
-   Check sugar twice a day (fasting and bedtime)  - Bring your meter/sugar log on next visit  - Continue Metformin 1000 mg Twice a day - Glipizide 5 mg Twice a day with meals - Continue Januvia 100 mg daily   - HOW TO TREAT LOW BLOOD SUGARS (Blood sugar LESS THAN 70 MG/DL)  Please follow the RULE OF 15 for the treatment of hypoglycemia treatment (when your (blood sugars are less than 70 mg/dL)    STEP 1: Take 15 grams of carbohydrates when your blood sugar is low, which includes:   3-4 GLUCOSE TABS  OR  3-4 OZ OF JUICE OR REGULAR SODA OR  ONE TUBE OF GLUCOSE GEL     STEP 2: RECHECK blood sugar in 15 MINUTES STEP 3: If your blood sugar is still low at the 15 minute recheck --> then, go back to STEP 1 and treat AGAIN with another 15 grams of carbohydrates.  Choose healthy, lower carb lower calorie snacks: toss salad, cooked vegetables, cottage cheese, peanut butter, low fat cheese / string cheese, lower sodium deli meat, tuna salad or chicken salad

## 2017-12-19 ENCOUNTER — Encounter: Payer: Self-pay | Admitting: Dietician

## 2017-12-19 ENCOUNTER — Encounter: Payer: Medicare Other | Attending: Internal Medicine | Admitting: Dietician

## 2017-12-19 DIAGNOSIS — Z713 Dietary counseling and surveillance: Secondary | ICD-10-CM | POA: Diagnosis not present

## 2017-12-19 DIAGNOSIS — IMO0002 Reserved for concepts with insufficient information to code with codable children: Secondary | ICD-10-CM

## 2017-12-19 DIAGNOSIS — E118 Type 2 diabetes mellitus with unspecified complications: Secondary | ICD-10-CM

## 2017-12-19 DIAGNOSIS — E1165 Type 2 diabetes mellitus with hyperglycemia: Secondary | ICD-10-CM

## 2017-12-19 NOTE — Patient Instructions (Addendum)
Take your medication consistently at the same time each day. Glipizide and Metformin should be taken with food. Avoid skipping meals.  Try to have a meal schedule.  9 am breakfast  1-2 pm lunch  6-7 pm dinner  Avoid eating in front of the TV Avoid added sugar. Rethink what you drink.  Aim for drinks to have no carbohydrates. Baked, broiled, boiled or grilled rather than fried. Consider 2,000 units Vitamin D daily Consider armchair exercises.

## 2017-12-19 NOTE — Progress Notes (Signed)
Diabetes Self-Management Education  Visit Type: First/Initial  Appt. Start Time: 1115 Appt. End Time: 0240  12/20/2017  [name], identified by name and date of birth, is a 77 y.o. female with a diagnosis of Diabetes: Type 2.   ASSESSMENT History includes OSA on c-pap, GERD, HLD, and HTN. Her sleep is poor.  She does not sleep until 2-5 am and then only sleeps for 4-5 hours.  She wakes between 8-10 am. Her last A1C was 6.1% 11/2017 which has decreased. Medications include:    Metformin bid (she takes after supper and before bed)  Glipizide (with supper and before bed)  Januvia She has a very irregular eating schedule.  She often will not eat until supper and then eats a lot of snacks as she stays up late.   Due to this her medication schedule is very erratic. Her daughter fixes her mother's pill box and verbalized the problem of an irregular meal and medication schedule. She also drinks a lot of sweetened beverages. She does not like raw vegetables. High fat with foods eaten out in particular. No exercise.  Using a cane today due to hip pain but walking well after the visit.  Patient is here today with her daughter and would like to learn how to eat and not to eat to be able to better control her diabetes and decrease her medication needs if possible.  Patient lives with her daughter.  2 grand nieces are also in the home.  They share shopping and cooking. She eats out very often. 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  Height 5\' 2"  (1.575 m), weight 186 lb (84.4 kg). Body mass index is 34.02 kg/m.  Diabetes Self-Management Education - 12/19/17 1137      Visit Information   Visit Type  First/Initial      Initial Visit   Diabetes Type  Type 2    Are you currently following a meal plan?  No    Are you taking your medications as prescribed?  No      Health Coping   How would you rate your overall health?  Fair      Psychosocial Assessment   Patient Belief/Attitude about Diabetes  Motivated to manage diabetes    Self-care barriers  None    Self-management support  Doctor's office;Family    Other persons present  Patient;Family Member   daughter   Patient Concerns  Nutrition/Meal planning;Glycemic Control;Weight Control    Special Needs  None    Preferred Learning Style  No preference indicated    Learning Readiness  Ready    How often do you need to have someone help you when you read instructions, pamphlets, or other written materials from your doctor or pharmacy?  1 - Never    What is the last grade level you completed in school?  12th grade      Pre-Education Assessment   Patient understands the diabetes disease and treatment process.  Needs Review    Patient understands incorporating nutritional management into lifestyle.  Needs Review    Patient undertands incorporating physical activity into lifestyle.  Needs Review    Patient understands using medications safely.  Needs Review    Patient understands monitoring blood glucose, interpreting and using results  Needs Review    Patient understands prevention, detection, and treatment of acute complications.  Needs Review    Patient understands prevention, detection, and treatment of chronic complications.  Needs Review    Patient understands how to  develop strategies to address psychosocial issues.  Needs Review    Patient understands how to develop strategies to promote health/change behavior.  Needs Review      Complications   Last HgB A1C per patient/outside source  6.1 %   11/22/17   How often do you check your blood sugar?  1-2 times/day    Fasting Blood glucose range (mg/dL)  130-179;70-129   97-142   Number of hypoglycemic episodes per month  1    Can you tell when your blood sugar is low?  Yes    What do you do if your blood sugar is low?  ate something    Number of hyperglycemic episodes per week  1    Can you tell when your blood sugar is high?  No    Have  you had a dilated eye exam in the past 12 months?  Yes    Have you had a dental exam in the past 12 months?  No    Are you checking your feet?  Yes    How many days per week are you checking your feet?  7      Dietary Intake   Breakfast  SKIPS OR cereal (sweet) with 2% milk OR grits, eggs, occasional wheat toast, jelly or butter   9-10   Snack (morning)  none    Lunch  SKIPS if she eats breakfast OR [place] or egg or just mayo or banana sandwich on Wheat bread and occasional chips    Snack (afternoon)  none    Dinner  salmon or chicken, peas or broccoli with cheese or greens OR [place] Helper OR rare salad OR fast food kid's chicken nugget, fries sweet tea meal OR fried fish, hush puppies   7-9   Snack (evening)  cereal and milk OR PB&J sandwich OR leftovers OR yogurt OR oatmeal cake OR ice cream sandwich OR chips   midnight-1 am   Beverage(s)  sweet tea, regular lemonade, water, coffee with sugar and creamer      Exercise   Exercise Type  ADL's      Patient Education   Previous Diabetes Education  No    Nutrition management   Role of diet in the treatment of diabetes and the relationship between the three main macronutrients and blood glucose level;Meal timing in regards to the patients' current diabetes medication.;Information on hints to eating out and maintain blood glucose control.;Meal options for control of blood glucose level and chronic complications.;Food label reading, portion sizes and measuring food.    Physical activity and exercise   Role of exercise on diabetes management, blood pressure control and cardiac health.;Helped patient identify appropriate exercises in relation to his/her diabetes, diabetes complications and other health issue.    Medications  Reviewed patients medication for diabetes, action, purpose, timing of dose and side effects.;Other (comment)   discussed importance of regular medication timing   Monitoring  Purpose and frequency of SMBG.;Identified  appropriate SMBG and/or A1C goals.;Daily foot exams;Yearly dilated eye exam    Acute complications  Taught treatment of hypoglycemia - the 15 rule.    Psychosocial adjustment  Worked with patient to identify barriers to care and solutions;Role of stress on diabetes    Personal strategies to promote health  Lifestyle issues that need to be addressed for better diabetes care      Individualized Goals (developed by patient)   Nutrition  General guidelines for healthy choices and portions discussed;Other (comment)   improved meal consistency  Physical Activity  Exercise 3-5 times per week;15 minutes per day    Medications  take my medication as prescribed    Monitoring   test my blood glucose as discussed    Problem Solving  meal timing    Reducing Risk  do foot checks daily;increase portions of healthy fats    Health Coping  discuss diabetes with (comment)   MD, RD, CDE     Post-Education Assessment   Patient understands the diabetes disease and treatment process.  Demonstrates understanding / competency    Patient understands incorporating nutritional management into lifestyle.  Needs Review    Patient undertands incorporating physical activity into lifestyle.  Needs Review    Patient understands using medications safely.  Needs Review    Patient understands monitoring blood glucose, interpreting and using results  Demonstrates understanding / competency    Patient understands prevention, detection, and treatment of acute complications.  Demonstrates understanding / competency    Patient understands prevention, detection, and treatment of chronic complications.  Demonstrates understanding / competency    Patient understands how to develop strategies to address psychosocial issues.  Needs Review    Patient understands how to develop strategies to promote health/change behavior.  Needs Review      Outcomes   Expected Outcomes  Demonstrated interest in learning. Expect positive outcomes     Future DMSE  3-4 months    Program Status  Completed       Individualized Plan for Diabetes Self-Management Training:   Learning Objective:  Patient will have a greater understanding of diabetes self-management. Patient education plan is to attend individual and/or group sessions per assessed needs and concerns.   Plan:   Patient Instructions  Take your medication consistently at the same time each day. Glipizide and Metformin should be taken with food. Avoid skipping meals.  Try to have a meal schedule.  9 am breakfast  1-2 pm lunch  6-7 pm dinner  Avoid eating in front of the TV Avoid added sugar. Rethink what you drink.  Aim for drinks to have no carbohydrates. Baked, broiled, boiled or grilled rather than fried. Consider 2,000 units Vitamin D daily Consider armchair exercises.    Expected Outcomes:  Demonstrated interest in learning. Expect positive outcomes  Education material provided: ADA Diabetes: Your Take Control Guide, Food label handouts, Meal plan card and Snack sheet  If problems or questions, patient to contact team via:  Phone  Future DSME appointment: 3-4 months

## 2018-01-17 ENCOUNTER — Ambulatory Visit: Payer: Medicare Other | Admitting: Podiatry

## 2018-01-17 NOTE — Progress Notes (Signed)
Cardiology Office Note:    Date:  01/20/2018   ID:  Nancy [name], DOB 03-09-1940, MRN [id number]  PCP:  Dianna Rossetti, NP  Cardiologist:  No primary care provider on file.    Referring MD: Dianna Rossetti, NP   Chief Complaint  Patient presents with  . Hypertension  . Palpitations  . Sleep Apnea    History of Present Illness:    Nancy [name] is a 78 y.o. female with a hx of HTN and diastolic dysfunction .  She also has a history of mild pulmonary hypertension by 2D echocardiogram in July 2013.  She had a Lexiscan Myoview for chest pain 10/13/2016 which showed no inducible ischemia. She was last seen by me 07/2017 complaining of SOB and had an Echo done showing normal LVF with EF 60-65% with moderate LVH and elevated filling pressures. Pro BNP was normal.    She is here today for followup and is doing well.  She denies any chest pain or pressure, PND, orthopnea, LE edema, dizziness, palpitations or syncope. Her SOB has significantly improved. She is compliant with her meds and is tolerating meds with no SE.  She is doing well with her CPAP device.  She tolerates the mask and feels the pressure is adequate.  Since going on CPAP she feels rested in the am and has no significant daytime sleepiness.  She denies any significant mouth or nasal dryness or nasal congestion.  She does not think that he snores.     Past Medical History:  Diagnosis Date  . Allergy   . Breast cyst    left breast  . Chest pain   . Complication of anesthesia 1986   slow to awaken after cholecystectomy  . Diastolic dysfunction    with mild pulmonary HTN by echo 07/2011  . Gastric ulcer   . GERD (gastroesophageal reflux disease)   . Gout   . Hyperlipidemia   . Hypertension   . 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 Osteoarthritis   . Osteopenia   . Shortness of breath dyspnea    with activity  . Spine deformity    lower spine  . Type II diabetes mellitus ([place])     Past Surgical History:   Procedure Laterality Date  . ABDOMINAL ADHESION SURGERY    . ABDOMINAL HYSTERECTOMY    . ANUS SURGERY     for torn tissue  . APPENDECTOMY    . BREAST BIOPSY    . BREAST LUMPECTOMY     left  . CHOLECYSTECTOMY  1986  . COLONOSCOPY    . ESOPHAGOGASTRODUODENOSCOPY (EGD) WITH PROPOFOL N/A 07/18/2013   Procedure: ESOPHAGOGASTRODUODENOSCOPY (EGD) WITH PROPOFOL;  Surgeon: Arta Silence, MD;  [facility];  Service: Endoscopy;  Laterality: N/A;  . EUS N/A 07/18/2013   Procedure: ESOPHAGEAL ENDOSCOPIC ULTRASOUND (EUS) RADIAL;  Surgeon: Arta Silence, MD;  [facility];  Service: Endoscopy;  Laterality: N/A;  . IR GENERIC HISTORICAL  10/30/2015   IR US GUIDE VASC ACCESS RIGHT 10/30/2015 Saverio Danker, PA-C [facility]  . IR GENERIC HISTORICAL  10/30/2015   IR FLUORO GUIDE CV LINE RIGHT 10/30/2015 Saverio Danker, PA-C [facility]  . LUMBAR WOUND DEBRIDEMENT N/A 10/01/2015   Procedure: LUMBAR WOUND DEBRIDEMENT;  Surgeon: Melina Schools, MD;  Location: [place];  Service: Orthopedics;  Laterality: N/A;  . LUMBAR WOUND DEBRIDEMENT N/A 10/15/2015   Procedure: [facility] OUT AND CLOSURE OF BACK WOUND;  Surgeon: Melina Schools, MD;  Location: [place];  Service: Orthopedics;  Laterality: N/A;  . MASS EXCISION Left 07/24/2015   Procedure: EXCISION MASS LEFT LONG FINGER;  Surgeon: Leanora Cover, MD;  Location: [place];  Service: Orthopedics;  Laterality: Left;  . SPINAL FUSION N/A 09/17/2015   Procedure: FUSION POSTERIOR SPINAL MULTILEVEL L4- S1;  Surgeon: Melina Schools, MD;  Location: [place];  Service: Orthopedics;  Laterality: N/A;  . TRANSFORAMINAL LUMBAR INTERBODY FUSION (TLIF) WITH PEDICLE SCREW FIXATION 1 LEVEL N/A 09/17/2015   Procedure: TRANSFORAMINAL LUMBAR INTERBODY FUSION (TLIF) WITH PEDICLE SCREW FIXATION 1 LEVEL , Lumbar 4-5;  Surgeon: Melina Schools, MD;  Location: [place];  Service: Orthopedics;  Laterality: N/A;    Current Medications: Current Meds  Medication Sig  .  allopurinol (ZYLOPRIM) 100 MG tablet Take 200 mg by mouth daily.   [name] amLODipine (NORVASC) 10 MG tablet Take 10 mg by mouth every morning.   [name] aspirin EC 81 MG tablet Take 81 mg by mouth daily.  . colchicine (COLCRYS) 0.6 MG tablet Take 0.6 mg by mouth daily as needed (Gout).   . DUREZOL 0.05 % EMUL   . furosemide (LASIX) 20 MG tablet Take 1 tablet (20 mg total) by mouth daily.  [name] gabapentin (NEURONTIN) 300 MG capsule Take 300 mg by mouth 3 (three) times daily.   [name] glipiZIDE (GLUCOTROL) 5 MG tablet Take 5 mg by mouth daily before breakfast.  . glucose blood (CONTOUR TEST) test strip Use as instructed  . JANUVIA 100 MG tablet TAKE 1 TABLET BY MOUTH ONCE DAILY FOR 90 DAYS  . losartan (COZAAR) 50 MG tablet Take 50 mg by mouth daily.  . metFORMIN (GLUCOPHAGE) 1000 MG tablet Take 1,000 mg by mouth 2 (two) times daily with a meal.    . metoprolol succinate (TOPROL-XL) 100 MG 24 hr tablet Take 100 mg by mouth daily. Take with or immediately following a meal.  . omeprazole (PRILOSEC) 20 MG capsule Take 20 mg by mouth daily.  . potassium chloride SA (K-DUR,KLOR-CON) 20 MEQ tablet Take 1 tablet (20 mEq total) by mouth daily.     Allergies:   Morphine and related; Toradol [ketorolac tromethamine]; and Codeine   Social History   Socioeconomic History  . Marital status: Divorced    Spouse name: Not on file  . Number of children: 4  . Years of education: 60  . Highest education level: Not on file  Occupational History  . Not on file  Social Needs  . Financial resource strain: Not on file  . Food insecurity:    Worry: Not on file    Inability: Not on file  . Transportation needs:    Medical: Not on file    Non-medical: Not on file  Tobacco Use  . Smoking status: Never Smoker  . Smokeless tobacco: Never Used  Substance and Sexual Activity  . Alcohol use: No    Comment: occasional wine  . Drug use: No  . Sexual activity: Not Currently  Lifestyle  . Physical activity:    Days per week:  Not on file    Minutes per session: Not on file  . Stress: Not on file  Relationships  . Social connections:    Talks on phone: Not on file    Gets together: Not on file    Attends religious service: Not on file    Active member of club or organization: Not on file    Attends meetings of clubs or organizations: Not on file    Relationship status: Not on  file  Other Topics Concern  . Not on file  Social History Narrative   Patient is single, has 3 children living 1 deceased   Patient is right handed   Education level is 12   Caffeine consumption is 0     Family History: The patient's family history includes Cancer in her sister; Heart disease in her mother.  ROS:   Please see the history of present illness.    ROS  All other systems reviewed and negative.   EKGs/Labs/Other Studies Reviewed:    The following studies were reviewed today: none  EKG:  EKG is not ordered today.   Recent Labs: 07/29/2017: Hemoglobin 13.3; NT-Pro BNP 141; Platelets 265; TSH 4.640 09/23/2017: BUN 14; Creatinine, Ser 1.02; Potassium 4.2; Sodium 139   Recent Lipid Panel No results found for: CHOL, TRIG, HDL, CHOLHDL, VLDL, LDLCALC, LDLDIRECT  Physical Exam:    VS:  BP (!) 138/56   Pulse 82   Ht 5\' 2"  (1.575 m)   Wt 185 lb 3.2 oz (84 kg)   SpO2 95%   BMI 33.87 kg/m     Wt Readings from Last 3 Encounters:  01/18/18 185 lb 3.2 oz (84 kg)  12/19/17 186 lb (84.4 kg)  11/22/17 192 lb 3.2 oz (87.2 kg)     GEN: 2 Well nourished, well developed in no acute distress HEENT: Normal NECK: No JVD; No carotid bruits LYMPHATICS: No lymphadenopathy CARDIAC: RRR, no murmurs, rubs, gallops RESPIRATORY:  Clear to auscultation without rales, wheezing or rhonchi  ABDOMEN: Soft, non-tender, non-distended MUSCULOSKELETAL:  No edema; No deformity  SKIN: Warm and dry NEUROLOGIC:  Alert and oriented x 3 PSYCHIATRIC:  Normal affect   ASSESSMENT:    1. Essential hypertension, benign   2. Pulmonary  hypertension, unspecified ([place])   3. SOB (shortness of breath)   4. OSA (obstructive sleep apnea)    PLAN:    In order of problems listed above:  1.  HTN - BP is controlled on exam today.  She will continue on amlodipine 10mg  daily, Toprol XL 100g daily and Losartan 50mg  daily.  Creatinine was stable at 1.23 on 11/24/2017.  2.  Pulmonary HTN - echo 07/2017 was not able to calculate PASP due to lack of TR jet.   3.  SOB - felt to be noncardiac.  Coronary CTA showed no obstructive CAD and a low calcium score of 6.5.  Nuclear stress test 2018 with no ischemia. 2D echo showed normal LVF with normal BNP.    4.  OSA - the patient is tolerating PAP therapy well without any problems. The PAP download was reviewed today and showed an AHI of 3.6/hr on 12 cm H2O with 70% compliance in using more than 4 hours nightly.  The patient has been using and benefiting from PAP use and will continue to benefit from therapy.   5.  Palpitations - event monitor 07/2017 showed NSR with PACs and PVCs.      Medication Adjustments/Labs and Tests Ordered: Current medicines are reviewed at length with the patient today.  Concerns regarding medicines are outlined above.  No orders of the defined types were placed in this encounter.  No orders of the defined types were placed in this encounter.   Signed, Fransico Him, MD  01/20/2018 3:29 PM    [place]

## 2018-01-18 ENCOUNTER — Ambulatory Visit (INDEPENDENT_AMBULATORY_CARE_PROVIDER_SITE_OTHER): Payer: Medicare Other | Admitting: Cardiology

## 2018-01-18 ENCOUNTER — Encounter: Payer: Self-pay | Admitting: Cardiology

## 2018-01-18 VITALS — BP 138/56 | HR 82 | Ht 62.0 in | Wt 185.2 lb

## 2018-01-18 DIAGNOSIS — R0602 Shortness of breath: Secondary | ICD-10-CM | POA: Diagnosis not present

## 2018-01-18 DIAGNOSIS — I10 Essential (primary) hypertension: Secondary | ICD-10-CM | POA: Diagnosis not present

## 2018-01-18 DIAGNOSIS — I272 Pulmonary hypertension, unspecified: Secondary | ICD-10-CM

## 2018-01-18 DIAGNOSIS — G4733 Obstructive sleep apnea (adult) (pediatric): Secondary | ICD-10-CM | POA: Diagnosis not present

## 2018-01-18 NOTE — Patient Instructions (Signed)

## 2018-01-24 ENCOUNTER — Ambulatory Visit (INDEPENDENT_AMBULATORY_CARE_PROVIDER_SITE_OTHER): Payer: Medicare Other | Admitting: Podiatry

## 2018-01-24 ENCOUNTER — Telehealth: Payer: Self-pay

## 2018-01-24 DIAGNOSIS — M79675 Pain in left toe(s): Secondary | ICD-10-CM | POA: Diagnosis not present

## 2018-01-24 DIAGNOSIS — E782 Mixed hyperlipidemia: Secondary | ICD-10-CM

## 2018-01-24 DIAGNOSIS — E1149 Type 2 diabetes mellitus with other diabetic neurological complication: Secondary | ICD-10-CM | POA: Diagnosis not present

## 2018-01-24 DIAGNOSIS — B351 Tinea unguium: Secondary | ICD-10-CM | POA: Diagnosis not present

## 2018-01-24 DIAGNOSIS — M79674 Pain in right toe(s): Secondary | ICD-10-CM | POA: Diagnosis not present

## 2018-01-24 NOTE — Telephone Encounter (Signed)
-----   Message from Sueanne Margarita, MD sent at 01/23/2018  9:35 PM EST ----- Lipids were 6 months ago - please have her come in to repeat FLP and ALt  Traci ----- Message ----- From: Sarina Ill, RN Sent: 01/23/2018   2:30 PM EST To: Sueanne Margarita, MD  Results uploaded to Epic. ----- Message ----- From: Sueanne Margarita, MD Sent: 01/20/2018   3:30 PM EST To: Sarina Ill, RN  Please get a copy of last FLP and ALT from PCP

## 2018-01-24 NOTE — Progress Notes (Signed)
Subjective: 78 y.o. returns the office today for painful, elongated, thickened toenails which she cannot trim herself. Denies any redness or drainage around the nails. She still gets some neuropathy pain and is on gabapentin. PT did help a lot with the neurogenix. Denies any acute changes since last appointment and no new complaints today. Denies any systemic complaints such as fevers, chills, nausea, vomiting.   PCP: Dianna Rossetti, NP A1c: 6.1 on 11/22/2017  Objective: AAO 3, NAD DP/PT pulses palpable, CRT less than 3 seconds Sensation decreased with SWMF.  Nails hypertrophic, dystrophic, elongated, brittle, discolored 10. There is tenderness overlying the nails 1-5 bilaterally. There is no surrounding erythema or drainage along the nail sites. No open lesions or pre-ulcerative lesions are identified. No other areas of tenderness bilateral lower extremities. No overlying edema, erythema, increased warmth. No pain with calf compression, swelling, warmth, erythema.  Assessment: Patient presents with symptomatic onychomycosis  Plan: -Treatment options including alternatives, risks, complications were discussed -Nails sharply debrided 10 without complication/bleeding. -Continue current dose of gabapentin as this has bene helpful. If symptoms worsen can possibly increase the dose.  -Discussed daily foot inspection. If there are any changes, to call the office immediately.  -Follow-up in 3 months or sooner if any problems are to arise. In the meantime, encouraged to call the office with any questions, concerns, changes symptoms.  Celesta Gentile, DPM

## 2018-01-24 NOTE — Telephone Encounter (Signed)
Spoke with the patient, she expressed understanding and is scheduled for fasting labs on 1/21.

## 2018-01-31 ENCOUNTER — Other Ambulatory Visit: Payer: Medicare Other

## 2018-01-31 DIAGNOSIS — E782 Mixed hyperlipidemia: Secondary | ICD-10-CM

## 2018-02-01 ENCOUNTER — Telehealth: Payer: Self-pay

## 2018-02-01 DIAGNOSIS — E782 Mixed hyperlipidemia: Secondary | ICD-10-CM

## 2018-02-01 LAB — LIPID PANEL
CHOL/HDL RATIO: 4.4 ratio (ref 0.0–4.4)
Cholesterol, Total: 161 mg/dL (ref 100–199)
HDL: 37 mg/dL — ABNORMAL LOW (ref >39)
LDL Calculated: 97 mg/dL (ref 0–99)
Triglycerides: 137 mg/dL (ref 0–149)
VLDL Cholesterol Cal: 27 mg/dL (ref 5–40)

## 2018-02-01 LAB — ALT: ALT: 27 IU/L (ref 0–32)

## 2018-02-01 MED ORDER — ATORVASTATIN CALCIUM 10 MG PO TABS: 10.0000 mg | ORAL_TABLET | Freq: Every day | ORAL | 3 refills | Status: DC

## 2018-02-01 NOTE — Telephone Encounter (Signed)
Spoke with the patient, she expressed understanding about her fasting lab results. She accepted taking Lipitor 10 mg, daily and is scheduled for follow up fasting labs on 03/14/18.

## 2018-02-01 NOTE — Telephone Encounter (Signed)
-----   Message from Sueanne Margarita, MD sent at 02/01/2018  8:48 AM EST ----- LDL goal is < 70 so too high given DM and evidence of Ca on Chest CT.  Please have her start Lipitor 10mg  daily and repeat FLP and ALT in 6 weeks

## 2018-02-23 ENCOUNTER — Encounter: Payer: Self-pay | Admitting: Internal Medicine

## 2018-02-23 ENCOUNTER — Ambulatory Visit (INDEPENDENT_AMBULATORY_CARE_PROVIDER_SITE_OTHER): Payer: Medicare Other | Admitting: Internal Medicine

## 2018-02-23 ENCOUNTER — Encounter: Payer: Medicare Other | Attending: Internal Medicine | Admitting: Dietician

## 2018-02-23 ENCOUNTER — Encounter: Payer: Self-pay | Admitting: Dietician

## 2018-02-23 VITALS — BP 118/60 | HR 74 | Ht 62.0 in | Wt 190.0 lb

## 2018-02-23 DIAGNOSIS — E1142 Type 2 diabetes mellitus with diabetic polyneuropathy: Secondary | ICD-10-CM | POA: Diagnosis not present

## 2018-02-23 DIAGNOSIS — E1165 Type 2 diabetes mellitus with hyperglycemia: Secondary | ICD-10-CM | POA: Diagnosis not present

## 2018-02-23 DIAGNOSIS — Z713 Dietary counseling and surveillance: Secondary | ICD-10-CM | POA: Insufficient documentation

## 2018-02-23 LAB — POCT GLYCOSYLATED HEMOGLOBIN (HGB A1C): Hemoglobin A1C: 6.6 % — AB (ref 4.0–5.6)

## 2018-02-23 NOTE — Patient Instructions (Addendum)
Great job on reducing the sugary beverages! Work with your daughter on simple meal planning. Consider the [employer] or other exercise most days. Consider a food diary  Continue to work on meal time consistency Continue to work on medication time consistency

## 2018-02-23 NOTE — Patient Instructions (Addendum)
-   Decrease Glipizide to 2.5 mg (Half a Tablet) Twice a day with meals - Continue Metformin 1000 mg Twice a day - Continue Januvia 100 mg daily   - If your sugars are consistently below 100 mg/dL, Please stop the Glipizide all together and notify our office.   - HOW TO TREAT LOW BLOOD SUGARS (Blood sugar LESS THAN 70 MG/DL)  Please follow the RULE OF 15 for the treatment of hypoglycemia treatment (when your (blood sugars are less than 70 mg/dL)    STEP 1: Take 15 grams of carbohydrates when your blood sugar is low, which includes:   3-4 GLUCOSE TABS  OR  3-4 OZ OF JUICE OR REGULAR SODA OR  ONE TUBE OF GLUCOSE GEL     STEP 2: RECHECK blood sugar in 15 MINUTES STEP 3: If your blood sugar is still low at the 15 minute recheck --> then, go back to STEP 1 and treat AGAIN with another 15 grams of carbohydrates.

## 2018-02-23 NOTE — Progress Notes (Signed)
Name: Nancy [name]  Age/ Sex: 78 y.o., female   MRN/ DOB: [id number], 12/28/1940     PCP: Nancy Rossetti, NP   Reason for Endocrinology Evaluation: Type 2 Diabetes Mellitus  Initial Endocrine Consultative Visit: 10/31/2017    PATIENT IDENTIFIER: Nancy [name] is a 78 y.o. female with a past medical history of HTN, Gout, Hyperlipidemia, and T2 DM . The patient has followed with Endocrinology clinic since 10/31/17 for consultative assistance with management of her diabetes.  DIABETIC HISTORY:  Nancy [name] was diagnosed with T2DM ~ in 2009, she has been on oral glycemic agents since her diagnosis. Has not been on insulin prior to her presentation here. Her hemoglobin A1c has ranged from 7.3% in 09/2015, peaking at 8.6% in 08/2015.   SUBJECTIVE:   During the last visit (11/22/2017): Her A1c was 6.1%. She was continued on Metformin, Glipizide and Januvia  Today (02/23/2018): Nancy [name] is here with her daughter for a 3 month follow up on diabetes management. She checks her blood sugars 2 times daily.The patient has had 1 hypoglycemic episodes since the last clinic visit. She did not have any symptoms at the time. She did correct properly for this. Otherwise, the patient has not required any recent emergency interventions for hypoglycemia and has not had recent hospitalizations secondary to hyper or hypoglycemic episodes.  She has been doing better in checking her glucose, taking her medications and avoids snacking except for the occasional late night snacks which is evident on her meter.     ROS: As per HPI and as detailed below: Review of Systems  Constitutional: Negative for fever and weight loss.  HENT: Negative for congestion and sore throat.   Respiratory: Negative for cough and shortness of breath.   Cardiovascular: Negative for chest pain and palpitations.  Gastrointestinal: Negative  for diarrhea and nausea.  Skin: Negative for rash.      HOME DIABETES REGIMEN:    Metfomrin 1000 mg BID  Glipizide 5 mg BID with meals   Januvia 100 mg daily     METER DOWNLOAD SUMMARY: Date range evaluated: 1/14-2/13/20 Fingerstick Blood Glucose Tests =24 Average Number Tests/Day = 0.8 Overall Mean FS Glucose = 128 Standard Deviation = 34  BG Ranges: Low = 67 High = 193   Hypoglycemic Events/30 Days: BG < 50 = 0 Episodes of symptomatic severe hypoglycemia = 0      HISTORY:  Past Medical History:  Past Medical History:  Diagnosis Date  . Allergy   . Breast cyst    left breast  . Chest pain   . Complication of anesthesia 1986   slow to awaken after cholecystectomy  . Diastolic dysfunction    with mild pulmonary HTN by echo 07/2011  . Gastric ulcer   . GERD (gastroesophageal reflux disease)   . Gout   . Hyperlipidemia   . Hypertension   . 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 Osteoarthritis   . Osteopenia   . Shortness of breath dyspnea    with activity  . Spine deformity    lower spine  . Type II diabetes mellitus ([place])    Past Surgical History:  Past Surgical History:  Procedure Laterality Date  . ABDOMINAL ADHESION SURGERY    . ABDOMINAL HYSTERECTOMY    . ANUS SURGERY     for torn tissue  . APPENDECTOMY    . BREAST BIOPSY    . BREAST LUMPECTOMY     left  . CHOLECYSTECTOMY  1986  . COLONOSCOPY    . ESOPHAGOGASTRODUODENOSCOPY (EGD) WITH PROPOFOL N/A 07/18/2013   Procedure: ESOPHAGOGASTRODUODENOSCOPY (EGD) WITH PROPOFOL;  Surgeon: Arta Silence, MD;  [facility];  Service: Endoscopy;  Laterality: N/A;  . EUS N/A 07/18/2013   Procedure: ESOPHAGEAL ENDOSCOPIC ULTRASOUND (EUS) RADIAL;  Surgeon: Arta Silence, MD;  [facility];  Service: Endoscopy;  Laterality: N/A;  . IR GENERIC HISTORICAL  10/30/2015   IR US GUIDE VASC ACCESS RIGHT 10/30/2015 Saverio Danker, PA-C [facility]  . IR GENERIC HISTORICAL  10/30/2015    IR FLUORO GUIDE CV LINE RIGHT 10/30/2015 Saverio Danker, PA-C [facility]  . LUMBAR WOUND DEBRIDEMENT N/A 10/01/2015   Procedure: LUMBAR WOUND DEBRIDEMENT;  Surgeon: Melina Schools, MD;  Location: [place];  Service: Orthopedics;  Laterality: N/A;  . LUMBAR WOUND DEBRIDEMENT N/A 10/15/2015   Procedure: [facility] OUT AND CLOSURE OF BACK WOUND;  Surgeon: Melina Schools, MD;  Location: [place];  Service: Orthopedics;  Laterality: N/A;  . MASS EXCISION Left 07/24/2015   Procedure: EXCISION MASS LEFT LONG FINGER;  Surgeon: Leanora Cover, MD;  Location: [place];  Service: Orthopedics;  Laterality: Left;  . SPINAL FUSION N/A 09/17/2015   Procedure: FUSION POSTERIOR SPINAL MULTILEVEL L4- S1;  Surgeon: Melina Schools, MD;  Location: [place];  Service: Orthopedics;  Laterality: N/A;  . TRANSFORAMINAL LUMBAR INTERBODY FUSION (TLIF) WITH PEDICLE SCREW FIXATION 1 LEVEL N/A 09/17/2015   Procedure: TRANSFORAMINAL LUMBAR INTERBODY FUSION (TLIF) WITH PEDICLE SCREW FIXATION 1 LEVEL , Lumbar 4-5;  Surgeon: Melina Schools, MD;  Location: [place];  Service: Orthopedics;  Laterality: N/A;    Social History:  reports that she has never smoked. She has never used smokeless tobacco. She reports that she does not drink alcohol or use drugs. Family History:  Family History  Problem Relation Age of Onset  . Heart disease Mother   . Cancer Sister        ovarian     HOME MEDICATIONS: Allergies as of 02/23/2018      Reactions   Morphine And Related Swelling   10/02/15 - given Toradol + morphine and RN noted tongue swelling - given Epi + Benadryl Has had morphine in past w/o problem   Toradol [ketorolac Tromethamine] Swelling   10/02/15 - given Toradol + morphine and RN noted tongue swelling - given Epi + Benadryl Has taken ibuprofen in past w/o problem   Codeine Other (See Comments)   headache      Medication List       Accurate as of February 23, 2018 10:49 AM. Always use your most recent med list.          allopurinol 100 MG tablet Commonly known as:  ZYLOPRIM Take 200 mg by mouth daily.   amLODipine 10 MG tablet Commonly known as:  NORVASC Take 10 mg by mouth every morning.   aspirin EC 81 MG tablet Take 81 mg by mouth daily.   atorvastatin 10 MG tablet Commonly known as:  LIPITOR Take 1 tablet (10 mg total) by mouth daily.   COLCRYS 0.6 MG tablet Generic drug:  colchicine Take 0.6 mg by mouth daily as needed (Gout).   DUREZOL 0.05 % Emul Generic drug:  Difluprednate   furosemide 20 MG tablet Commonly known as:  LASIX Take 1 tablet (20 mg total) by mouth daily.   gabapentin 300 MG capsule Commonly known as:  NEURONTIN Take 300 mg by mouth 3 (three) times daily.   glipiZIDE 5 MG tablet Commonly known  as:  GLUCOTROL Take 5 mg by mouth daily before breakfast.   glucose blood test strip Commonly known as:  CONTOUR TEST Use as instructed   JANUVIA 100 MG tablet Generic drug:  sitaGLIPtin TAKE 1 TABLET BY MOUTH ONCE DAILY FOR 90 DAYS   losartan 50 MG tablet Commonly known as:  COZAAR Take 50 mg by mouth daily.   metFORMIN 1000 MG tablet Commonly known as:  GLUCOPHAGE Take 1,000 mg by mouth 2 (two) times daily with a meal.   metoprolol succinate 100 MG 24 hr tablet Commonly known as:  TOPROL-XL Take 100 mg by mouth daily. Take with or immediately following a meal.   omeprazole 20 MG capsule Commonly known as:  PRILOSEC Take 20 mg by mouth daily.   potassium chloride SA 20 MEQ tablet Commonly known as:  K-DUR,KLOR-CON Take 1 tablet (20 mEq total) by mouth daily.        OBJECTIVE:   Vital Signs: BP 118/60 (BP Location: Left Arm, Patient Position: Sitting, Cuff Size: Large)   Pulse 74   Ht 5\' 2"  (1.575 m)   Wt 190 lb (86.2 kg)   SpO2 97%   BMI 34.75 kg/m   Wt Readings from Last 3 Encounters:  02/23/18 190 lb (86.2 kg)  01/18/18 185 lb 3.2 oz (84 kg)  12/19/17 186 lb (84.4 kg)     Exam: General: Pt appears well and is in NAD  Lungs: Clear with  good BS bilat with no rales, rhonchi, or wheezes  Heart: RRR with normal S1 and S2 and no gallops; no murmurs; no rub  Extremities: No pretibial edema.   Neuro: MS is good with appropriate affect, pt is alert and Ox3    DM foot exam:(10/31/2017)  The skin of the feet is intact without sores or ulcerations. The pedal pulses are 2+ on right and 2+ on left. The sensation is intact to a screening 5.07, 10 gram monofilament bilaterally           DATA REVIEWED:   Lab Results  Component Value Date   LDLCALC 97 01/31/2018   CREATININE 1.02 (H) 09/23/2017    Results for HAELEIGH, STREIFF (MRN [id number]) as of 11/22/2017 11:38  Ref. Range 11/22/2017 11:01  Hemoglobin A1C Latest Ref Range: 4.0 - 5.6 % 6.1 (A)     ASSESSMENT / PLAN / RECOMMENDATIONS:   1) Type 2 Diabetes Mellitus, Sub-optimally controlled, With neuropathic complications - Most recent A1c of 6.6 %. Goal A1c < 7.0 %.    Plan: - Praised patient on taking medications as prescribed and avoiding snacks as much as possible. - Pt had one hypoglycemia episode since her last visit, difficult to say if she has any more without more frequent checks. But her A1c has improved from 6.1 % to 6.6% - We will reduce Glipizide as below but if she has persistent BG's less then 100 mg/dL , she was advised to STOP the Glipizide.    MEDICATIONS:  Metfomrin 1000 mg BID  Glipizide 2.5 mg (Half a tablet) BID with meals   Januvia 100 mg daily   EDUCATION / INSTRUCTIONS:  BG monitoring instructions: Patient is instructed to check her blood sugars 2 times a day, fasting and bedtime.  Call [facility] if: BG persistently < 70 or > 300. . I reviewed the Rule of 15 for the treatment of hypoglycemia in detail with the patient. Literature supplied.   2) Diabetic complications:   Eye: Does not have known diabetic retinopathy. Last  eye exam was   Neuro/ Feet: Does  have known diabetic peripheral neuropathy.  Renal:  Patient does  have known baseline CKD. She is on an ACEI/ARB at present.   3) Lipids: Patient is not on a statin.    4) Hypertension: She is at goal of < 140/90 mmHg.    F/U in 3 months   Signed electronically by: Mack Guise, MD  [facility] Endocrinology  [address] [place], [place] [address] Phone: [phone] FAX: [phone]   CC:  Nancy [name], [place] [employer] [address] [place] [address] Phone: [phone]  Fax: [phone]  Return to Endocrinology clinic as below: Future Appointments  Date Time Provider [place]  02/23/2018 11:15 AM Clydell Hakim, RD [place] [facility]DM  03/14/2018  9:15 AM [facility]  04/25/2018  1:15 PM Trula Slade, DPM [facility]

## 2018-02-24 ENCOUNTER — Encounter: Payer: Self-pay | Admitting: Dietician

## 2018-02-24 NOTE — Progress Notes (Signed)
Diabetes Self-Management Education  Visit Type: Follow-up  Appt. Start Time: 1115 Appt. End Time: 6606  02/24/2018  [name], identified by name and date of birth, is a 78 y.o. female with a diagnosis of Diabetes:  Type 2.  History includes OSA on c-pap, GERD, HLD, and HTN. Her sleep is poor.  She does not sleep until 2-5 am and then only sleeps for 4-5 hours.  She wakes between 8-10 am.  This is unchanged since last visit. A1C 6.6% 02/23/18 which has increased from 6.1% 11/2017. Medications include  Metormin   Glipizide  Januvia She has tried to take her medication more consistently in the am and with dinner.  Because her meal schedule remains erratic, her medication schedule is also erratic. Routine is still a struggle. Her daughter confirms that routine and medication schedule remains inconsistent.  Since last visit, she has decreased her sugar sweetened beverage intake. She does not like raw vegetables.  High fat with foods eaten out in particular. No exercise.  Uses a cane but moving well today.  Patient lives with her daughter.  2 grand nieces are also in the home.  They share shopping and cooking although usually no one cooks but gets a lot of take out. 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  ASSESSMENT  Weight 190 lb (86.2 kg). Body mass index is 34.75 kg/m.  Diabetes Self-Management Education - 02/24/18 1014      Visit Information   Visit Type  Follow-up      Initial Visit   Are you taking your medications as prescribed?  Yes      Psychosocial Assessment   Self-care barriers  None    Self-management support  Doctor's office;Family    Other persons present  Patient;Family Member    Patient Concerns  Nutrition/Meal planning;Glycemic Control;Weight Control    Special Needs  None    Preferred Learning Style  No preference indicated    Learning Readiness  Ready      Pre-Education Assessment   Patient understands the diabetes disease and  treatment process.  Demonstrates understanding / competency    Patient understands incorporating nutritional management into lifestyle.  Needs Review    Patient undertands incorporating physical activity into lifestyle.  Needs Review    Patient understands using medications safely.  Needs Review    Patient understands monitoring blood glucose, interpreting and using results  Demonstrates understanding / competency    Patient understands prevention, detection, and treatment of acute complications.  Demonstrates understanding / competency    Patient understands prevention, detection, and treatment of chronic complications.  Demonstrates understanding / competency    Patient understands how to develop strategies to address psychosocial issues.  Demonstrates understanding / competency    Patient understands how to develop strategies to promote health/change behavior.  Needs Review      Complications   Last HgB A1C per patient/outside source  6.6 %   02/23/18   How often do you check your blood sugar?  1-2 times/day      Dietary Intake   Dinner  pizza    Snack (evening)  chicken, noodles, peas   1 am   Beverage(s)  water, sweet tea (1/day which is less), occasional coffee with sugar, occasional regular lemonade      Exercise   Exercise Type  ADL's      Patient Education   Previous Diabetes Education  Yes (please comment)   2 months ago   Nutrition management   Role of  diet in the treatment of diabetes and the relationship between the three main macronutrients and blood glucose level;Meal timing in regards to the patients' current diabetes medication.;Meal options for control of blood glucose level and chronic complications.;Food label reading, portion sizes and measuring food.    Physical activity and exercise   Role of exercise on diabetes management, blood pressure control and cardiac health.;Helped patient identify appropriate exercises in relation to his/her diabetes, diabetes complications  and other health issue.    Medications  Reviewed patients medication for diabetes, action, purpose, timing of dose and side effects.    Monitoring  Purpose and frequency of SMBG.    Psychosocial adjustment  Worked with patient to identify barriers to care and solutions    Personal strategies to promote health  Lifestyle issues that need to be addressed for better diabetes care      Individualized Goals (developed by patient)   Nutrition  General guidelines for healthy choices and portions discussed    Physical Activity  Exercise 3-5 times per week;15 minutes per day    Medications  take my medication as prescribed    Monitoring   test my blood glucose as discussed    Problem Solving  meal timing    Reducing Risk  do foot checks daily;increase portions of healthy fats      Patient Self-Evaluation of Goals - Patient rates self as meeting previously set goals (% of time)   Nutrition  50 - 75 %    Physical Activity  25 - 50%    Medications  >75%    Monitoring  >75%    Problem Solving  50 - 75 %    Reducing Risk  >75%    Health Coping  >75%      Post-Education Assessment   Patient understands the diabetes disease and treatment process.  Demonstrates understanding / competency    Patient understands incorporating nutritional management into lifestyle.  Needs Review    Patient undertands incorporating physical activity into lifestyle.  Needs Review    Patient understands using medications safely.  Demonstrates understanding / competency    Patient understands monitoring blood glucose, interpreting and using results  Demonstrates understanding / competency    Patient understands prevention, detection, and treatment of acute complications.  Demonstrates understanding / competency    Patient understands prevention, detection, and treatment of chronic complications.  Demonstrates understanding / competency    Patient understands how to develop strategies to address psychosocial issues.  Needs  Review    Patient understands how to develop strategies to promote health/change behavior.  Needs Review      Outcomes   Expected Outcomes  Demonstrated interest in learning. Expect positive outcomes    Future DMSE  2 months    Program Status  Completed      Subsequent Visit   Since your last visit have you continued or begun to take your medications as prescribed?  Yes    Since your last visit have you experienced any weight changes?  Gain    Weight Gain (lbs)  4    Since your last visit, are you checking your blood glucose at least once a day?  Yes       Individualized Plan for Diabetes Self-Management Training:   Learning Objective:  Patient will have a greater understanding of diabetes self-management. Patient education plan is to attend individual and/or group sessions per assessed needs and concerns.   Plan:   Patient Instructions  Doristine Devoid job on reducing the  sugary beverages! Work with your daughter on simple meal planning. Consider the [employer] or other exercise most days. Consider a food diary  Continue to work on meal time consistency Continue to work on medication time consistency   Expected Outcomes:  Demonstrated interest in learning. Expect positive outcomes  Education material provided: Food label handouts and My Plate  If problems or questions, patient to contact team via:  Phone  Future DSME appointment: 2 months

## 2018-02-28 IMAGING — CR DG HIP (WITH OR WITHOUT PELVIS) 2-3V*L*
2 series · 2 of 2 positions shown · non-contrast
Comparison: None

CLINICAL DATA: Chronic left hip pain for 1 year, no known injury

EXAM:
DG HIP (WITH OR WITHOUT PELVIS) 2-3V LEFT

[w pelvis *]
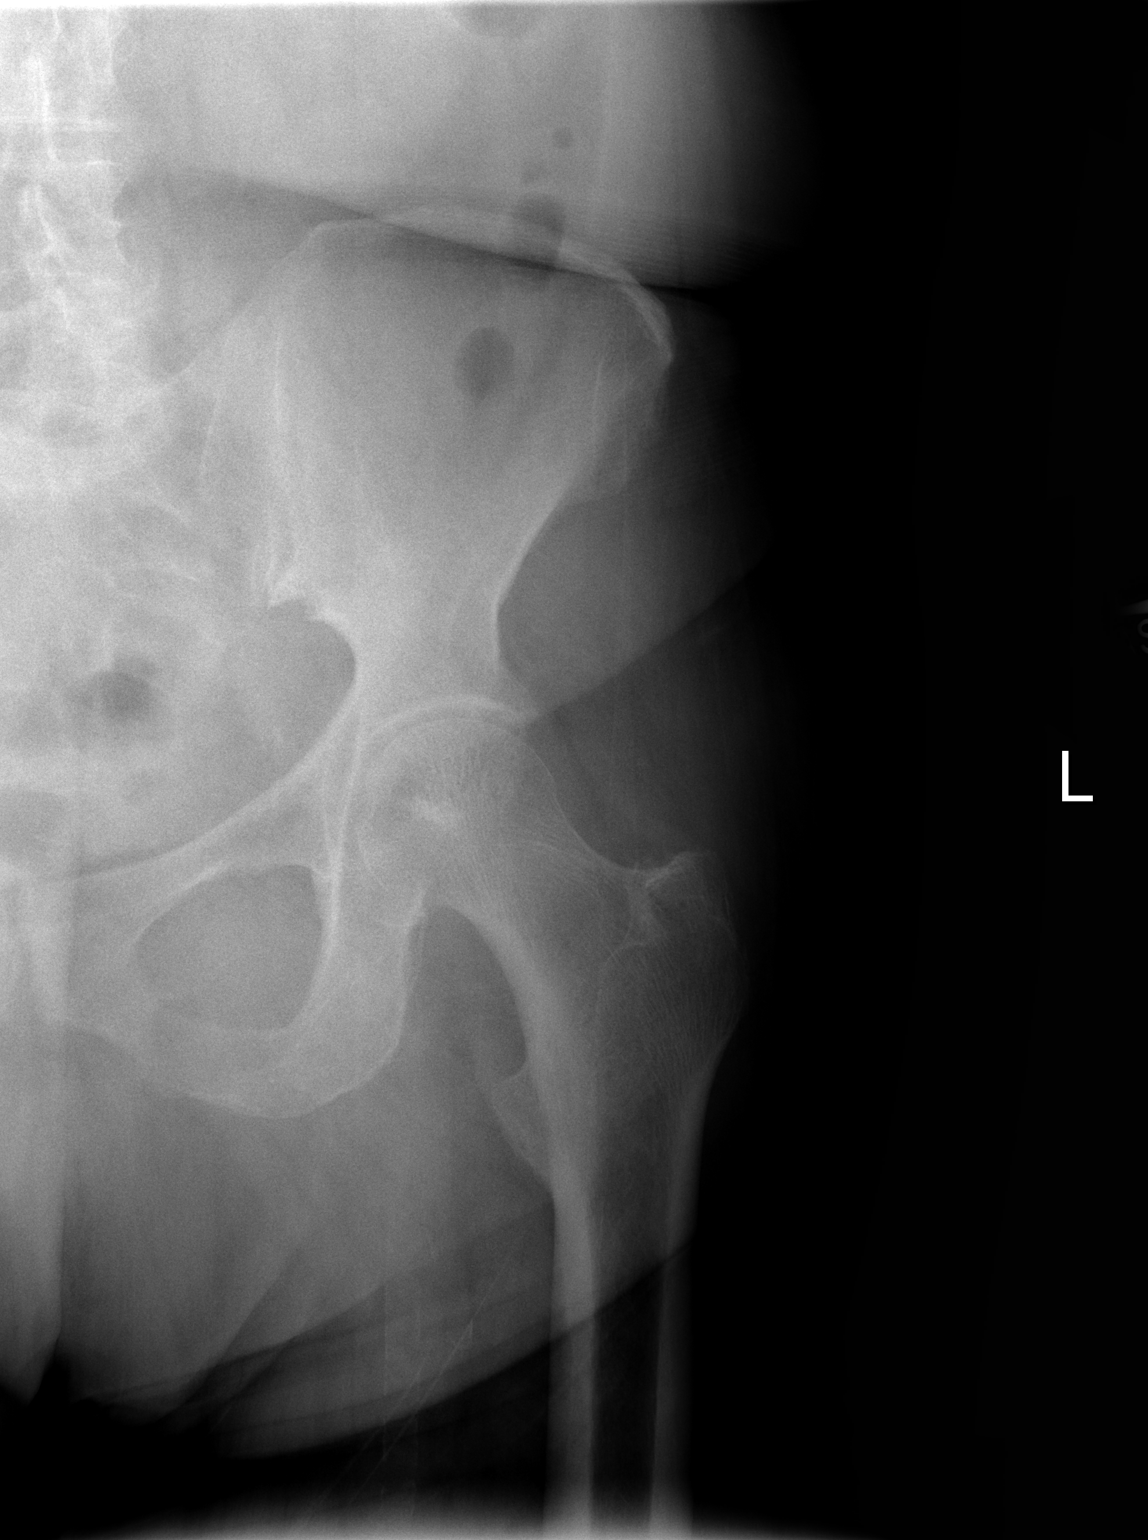

[t hip frog leg left *]
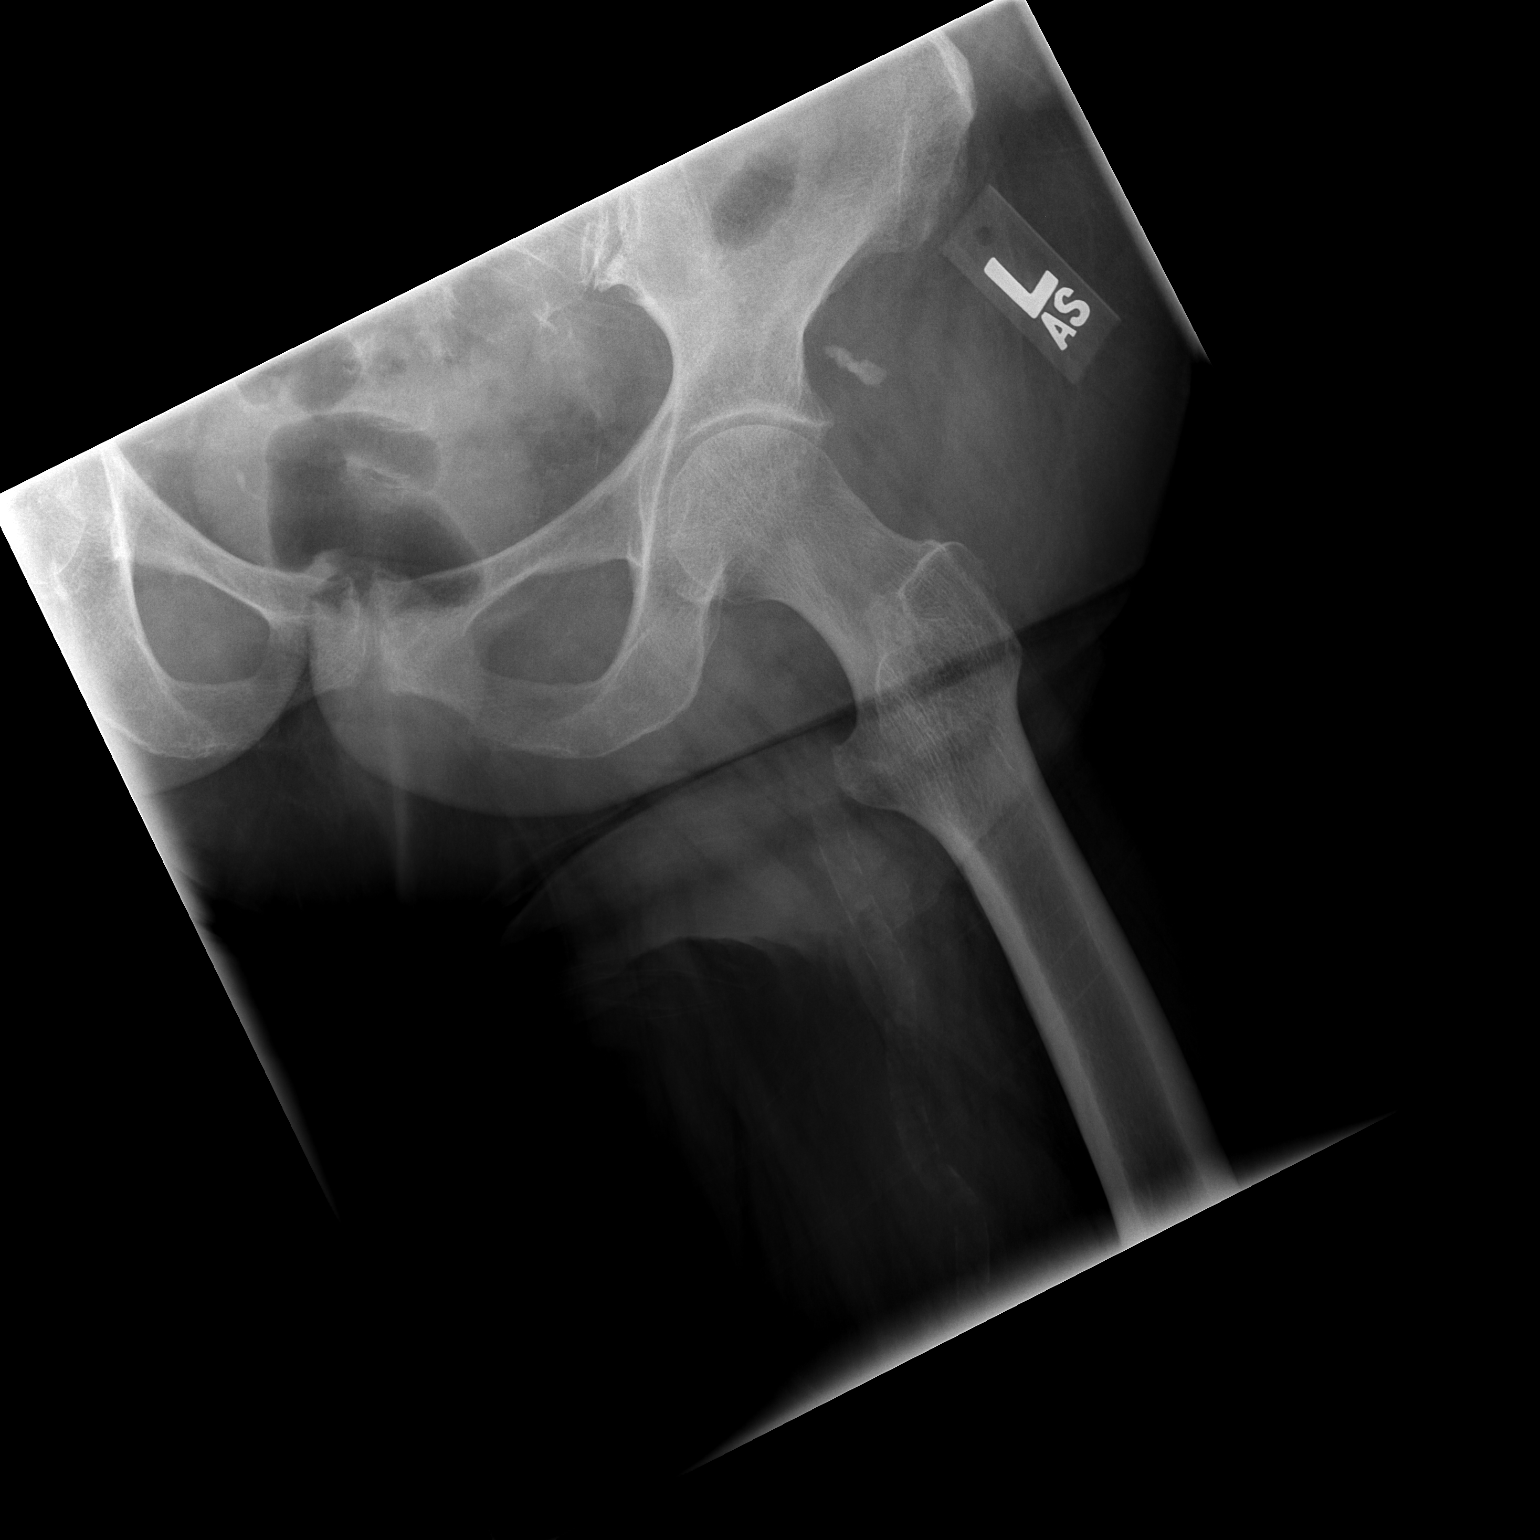

[2 of 2 positions shown; findings below may reference images not displayed]

FINDINGS: Two views of the left hip submitted. No acute fracture or
subluxation. Atherosclerotic calcifications of femoral artery.
Degenerative changes pubic symphysis.
IMPRESSION: No acute fracture or subluxation. Degenerative changes pubic
symphysis.

## 2018-03-03 ENCOUNTER — Telehealth: Payer: Self-pay

## 2018-03-03 NOTE — Telephone Encounter (Signed)
Discrepancy found on paperwork stating amputation of rt toe, spoke to pt and she stated that she has all of her toes. Faxed paperwork back to [facility] requesting corrected paperwork to be faxed for signature.

## 2018-03-03 NOTE — Telephone Encounter (Signed)
Paperwork for diabetic shoes from [facility] given to Dr. Kelton Pillar to sign

## 2018-03-14 ENCOUNTER — Other Ambulatory Visit: Payer: Medicare Other

## 2018-03-14 DIAGNOSIS — E782 Mixed hyperlipidemia: Secondary | ICD-10-CM

## 2018-03-15 LAB — LIPID PANEL
CHOL/HDL RATIO: 2.7 ratio (ref 0.0–4.4)
Cholesterol, Total: 119 mg/dL (ref 100–199)
HDL: 44 mg/dL (ref >39)
LDL Calculated: 55 mg/dL (ref 0–99)
TRIGLYCERIDES: 99 mg/dL (ref 0–149)
VLDL Cholesterol Cal: 20 mg/dL (ref 5–40)

## 2018-03-15 LAB — ALT: ALT: 25 IU/L (ref 0–32)

## 2018-03-16 ENCOUNTER — Ambulatory Visit (INDEPENDENT_AMBULATORY_CARE_PROVIDER_SITE_OTHER): Payer: Medicare Other | Admitting: Orthopedic Surgery

## 2018-03-31 ENCOUNTER — Telehealth (INDEPENDENT_AMBULATORY_CARE_PROVIDER_SITE_OTHER): Payer: Self-pay | Admitting: Orthopedic Surgery

## 2018-03-31 ENCOUNTER — Telehealth (INDEPENDENT_AMBULATORY_CARE_PROVIDER_SITE_OTHER): Payer: Self-pay | Admitting: *Deleted

## 2018-03-31 NOTE — Telephone Encounter (Signed)
appt cancelled

## 2018-04-03 ENCOUNTER — Ambulatory Visit (INDEPENDENT_AMBULATORY_CARE_PROVIDER_SITE_OTHER): Payer: Medicare Other | Admitting: Orthopedic Surgery

## 2018-04-25 ENCOUNTER — Ambulatory Visit: Payer: Medicare Other | Admitting: Podiatry

## 2018-04-25 ENCOUNTER — Ambulatory Visit: Payer: Medicare Other | Admitting: Orthotics

## 2018-04-27 ENCOUNTER — Ambulatory Visit: Payer: Medicare Other | Admitting: Dietician

## 2018-05-01 ENCOUNTER — Ambulatory Visit (INDEPENDENT_AMBULATORY_CARE_PROVIDER_SITE_OTHER): Payer: Medicare Other | Admitting: Orthotics

## 2018-05-01 ENCOUNTER — Ambulatory Visit (INDEPENDENT_AMBULATORY_CARE_PROVIDER_SITE_OTHER): Payer: Medicare Other | Admitting: Podiatry

## 2018-05-01 ENCOUNTER — Other Ambulatory Visit: Payer: Self-pay

## 2018-05-01 ENCOUNTER — Encounter: Payer: Self-pay | Admitting: Podiatry

## 2018-05-01 VITALS — Temp 95.2°F

## 2018-05-01 DIAGNOSIS — M2042 Other hammer toe(s) (acquired), left foot: Secondary | ICD-10-CM | POA: Diagnosis not present

## 2018-05-01 DIAGNOSIS — B351 Tinea unguium: Secondary | ICD-10-CM | POA: Diagnosis not present

## 2018-05-01 DIAGNOSIS — M79674 Pain in right toe(s): Secondary | ICD-10-CM | POA: Diagnosis not present

## 2018-05-01 DIAGNOSIS — E118 Type 2 diabetes mellitus with unspecified complications: Secondary | ICD-10-CM

## 2018-05-01 DIAGNOSIS — E1149 Type 2 diabetes mellitus with other diabetic neurological complication: Secondary | ICD-10-CM | POA: Diagnosis not present

## 2018-05-01 DIAGNOSIS — M2041 Other hammer toe(s) (acquired), right foot: Secondary | ICD-10-CM

## 2018-05-01 DIAGNOSIS — M79675 Pain in left toe(s): Secondary | ICD-10-CM

## 2018-05-01 DIAGNOSIS — E1165 Type 2 diabetes mellitus with hyperglycemia: Secondary | ICD-10-CM

## 2018-05-01 DIAGNOSIS — IMO0002 Reserved for concepts with insufficient information to code with codable children: Secondary | ICD-10-CM

## 2018-05-01 DIAGNOSIS — G629 Polyneuropathy, unspecified: Secondary | ICD-10-CM

## 2018-05-01 MED ORDER — DICLOFENAC SODIUM 1 % TD GEL: 2.0000 g | Freq: Four times a day (QID) | TRANSDERMAL | 2 refills | Status: DC

## 2018-05-01 NOTE — Progress Notes (Signed)

## 2018-05-04 NOTE — Progress Notes (Signed)
Subjective: 78 y.o. returns the office today for painful, elongated, thickened toenails which she cannot trim herself. Denies any redness or drainage around the nails. She is still experiencing the neuropathy pain and on gabapentin but wants to hold other medications. Denies any systemic complaints such as fevers, chills, nausea, vomiting.   PCP: Dianna Rossetti, NP A1c: 6.6 on 02/23/2018  Objective: AAO 3, NAD DP/PT pulses palpable, CRT less than 3 seconds Sensation decreased with SWMF.  Nails hypertrophic, dystrophic, elongated, brittle, discolored 10. There is tenderness overlying the nails 1-5 bilaterally. There is no surrounding erythema or drainage along the nail sites. No open lesions or pre-ulcerative lesions are identified. No other areas of tenderness bilateral lower extremities. No overlying edema, erythema, increased warmth. No pain with calf compression, swelling, warmth, erythema.  Assessment: Patient presents with symptomatic onychomycosis  Plan: -Treatment options including alternatives, risks, complications were discussed -Nails sharply debrided 10 without complication/bleeding. -Continue current dose of gabapentin for now.  -She saw Liliane Channel today to PUDS -Discussed daily foot inspection. If there are any changes, to call the office immediately.  -Follow-up in 3 months or sooner if any problems are to arise. In the meantime, encouraged to call the office with any questions, concerns, changes symptoms.  Celesta Gentile, DPM

## 2018-05-23 ENCOUNTER — Telehealth: Payer: Self-pay

## 2018-05-23 NOTE — Telephone Encounter (Signed)
Left message for patient to call back to change appointment to virtual visit.  CRM created.  

## 2018-05-24 ENCOUNTER — Ambulatory Visit: Payer: Medicare Other | Admitting: Internal Medicine

## 2018-05-24 NOTE — Progress Notes (Deleted)
Name: [name]  Age/ Sex: 78 y.o., female   MRN/ DOB: [id number], [birth year]     PCP: Nancy Rossetti, NP (Inactive)   Reason for Endocrinology Evaluation: Type 2 Diabetes Mellitus  Initial Endocrine Consultative Visit: 10/31/2017    PATIENT IDENTIFIER: [name] is a 78 y.o. female with a past medical history of HTN, Gout, Hyperlipidemia, and T2 DM . The patient has followed with Endocrinology clinic since 10/31/17 for consultative assistance with management of her diabetes.  DIABETIC HISTORY:  Nancy [name] was diagnosed with T2DM ~ in 2009, she has been on oral glycemic agents since her diagnosis. Has not been on insulin prior to her presentation here. Her hemoglobin A1c has ranged from 7.3% in 09/2015, peaking at 8.6% in 08/2015.   SUBJECTIVE:   During the last visit (11/22/2017): Her A1c was 6.1%. She was continued on Metformin, Glipizide and Januvia  Today (05/24/2018): Nancy [name] is here with her daughter for a 3 month follow up on diabetes management. She checks her blood sugars 2 times daily.The patient has had 1 hypoglycemic episodes since the last clinic visit. She did not have any symptoms at the time. She did correct properly for this. Otherwise, the patient has not required any recent emergency interventions for hypoglycemia and has not had recent hospitalizations secondary to hyper or hypoglycemic episodes.  She has been doing better in checking her glucose, taking her medications and avoids snacking except for the occasional late night snacks which is evident on her meter.     ROS: As per HPI and as detailed below: Review of Systems  Constitutional: Negative for fever and weight loss.  HENT: Negative for congestion and sore throat.   Respiratory: Negative for cough and shortness of breath.   Cardiovascular: Negative for chest pain and palpitations.  Gastrointestinal:  Negative for diarrhea and nausea.  Skin: Negative for rash.      HOME DIABETES REGIMEN:    Metfomrin 1000 mg BID  Glipizide 5 mg BID with meals   Januvia 100 mg daily     METER DOWNLOAD SUMMARY: Date range evaluated: 1/14-2/13/20 Fingerstick Blood Glucose Tests =24 Average Number Tests/Day = 0.8 Overall Mean FS Glucose = 128 Standard Deviation = 34  BG Ranges: Low = 67 High = 193   Hypoglycemic Events/30 Days: BG < 50 = 0 Episodes of symptomatic severe hypoglycemia = 0      HISTORY:  Past Medical History:  Past Medical History:  Diagnosis Date  . Allergy   . Breast cyst    left breast  . Chest pain   . Complication of anesthesia 1986   slow to awaken after cholecystectomy  . Diastolic dysfunction    with mild pulmonary HTN by echo 07/2011  . Gastric ulcer   . GERD (gastroesophageal reflux disease)   . Gout   . Hyperlipidemia   . Hypertension   . 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 Osteoarthritis   . Osteopenia   . Shortness of breath dyspnea    with activity  . Spine deformity    lower spine  . Type II diabetes mellitus ([place])    Past Surgical History:  Past Surgical History:  Procedure Laterality Date  . ABDOMINAL ADHESION SURGERY    . ABDOMINAL HYSTERECTOMY    . ANUS SURGERY     for torn tissue  . APPENDECTOMY    . BREAST BIOPSY    . BREAST LUMPECTOMY     left  .  CHOLECYSTECTOMY  1986  . COLONOSCOPY    . ESOPHAGOGASTRODUODENOSCOPY (EGD) WITH PROPOFOL N/A 07/18/2013   Procedure: ESOPHAGOGASTRODUODENOSCOPY (EGD) WITH PROPOFOL;  Surgeon: Arta Silence, MD;  Location: [facility];  Service: Endoscopy;  Laterality: N/A;  . EUS N/A 07/18/2013   Procedure: ESOPHAGEAL ENDOSCOPIC ULTRASOUND (EUS) RADIAL;  Surgeon: Arta Silence, MD;  Location: [facility];  Service: Endoscopy;  Laterality: N/A;  . IR GENERIC HISTORICAL  10/30/2015   IR US GUIDE VASC ACCESS RIGHT 10/30/2015 Saverio Danker, PA-C [facility]  . IR GENERIC HISTORICAL   10/30/2015   IR FLUORO GUIDE CV LINE RIGHT 10/30/2015 Saverio Danker, PA-C [facility]  . LUMBAR WOUND DEBRIDEMENT N/A 10/01/2015   Procedure: LUMBAR WOUND DEBRIDEMENT;  Surgeon: Melina Schools, MD;  Location: [place];  Service: Orthopedics;  Laterality: N/A;  . LUMBAR WOUND DEBRIDEMENT N/A 10/15/2015   Procedure: [facility] OUT AND CLOSURE OF BACK WOUND;  Surgeon: Melina Schools, MD;  Location: [place];  Service: Orthopedics;  Laterality: N/A;  . MASS EXCISION Left 07/24/2015   Procedure: EXCISION MASS LEFT LONG FINGER;  Surgeon: Leanora Cover, MD;  Location: [place];  Service: Orthopedics;  Laterality: Left;  . SPINAL FUSION N/A 09/17/2015   Procedure: FUSION POSTERIOR SPINAL MULTILEVEL L4- S1;  Surgeon: Melina Schools, MD;  Location: [place];  Service: Orthopedics;  Laterality: N/A;  . TRANSFORAMINAL LUMBAR INTERBODY FUSION (TLIF) WITH PEDICLE SCREW FIXATION 1 LEVEL N/A 09/17/2015   Procedure: TRANSFORAMINAL LUMBAR INTERBODY FUSION (TLIF) WITH PEDICLE SCREW FIXATION 1 LEVEL , Lumbar 4-5;  Surgeon: Melina Schools, MD;  Location: [place];  Service: Orthopedics;  Laterality: N/A;    Social History:  reports that she has never smoked. She has never used smokeless tobacco. She reports that she does not drink alcohol or use drugs. Family History:  Family History  Problem Relation Age of Onset  . Heart disease Mother   . Cancer Sister        ovarian     HOME MEDICATIONS: Allergies as of 05/24/2018      Reactions   Morphine And Related Swelling   10/02/15 - given Toradol + morphine and RN noted tongue swelling - given Epi + Benadryl Has had morphine in past w/o problem   Toradol [ketorolac Tromethamine] Swelling   10/02/15 - given Toradol + morphine and RN noted tongue swelling - given Epi + Benadryl Has taken ibuprofen in past w/o problem   Codeine Other (See Comments)   headache      Medication List       Accurate as of May 24, 2018  8:26 AM. If you have any questions, ask your nurse or  doctor.        allopurinol 100 MG tablet Commonly known as:  ZYLOPRIM Take 200 mg by mouth daily.   amLODipine 10 MG tablet Commonly known as:  NORVASC Take 10 mg by mouth every morning.   aspirin EC 81 MG tablet Take 81 mg by mouth daily.   atorvastatin 10 MG tablet Commonly known as:  LIPITOR Take 1 tablet (10 mg total) by mouth daily.   Colcrys 0.6 MG tablet Generic drug:  colchicine Take 0.6 mg by mouth daily as needed (Gout).   diclofenac sodium 1 % Gel Commonly known as:  VOLTAREN Apply 2 g topically 4 (four) times daily. Rub into affected area of foot 2 to 4 times daily   Durezol 0.05 % Emul Generic drug:  Difluprednate   furosemide 20 MG tablet Commonly known as:  LASIX Take  1 tablet (20 mg total) by mouth daily.   gabapentin 300 MG capsule Commonly known as:  NEURONTIN Take 300 mg by mouth 3 (three) times daily.   glipiZIDE 5 MG tablet Commonly known as:  GLUCOTROL Take 2.5 mg by mouth 2 (two) times daily before a meal.   glucose blood test strip Commonly known as:  Contour Test Use as instructed   Januvia 100 MG tablet Generic drug:  sitaGLIPtin TAKE 1 TABLET BY MOUTH ONCE DAILY FOR 90 DAYS   losartan 50 MG tablet Commonly known as:  COZAAR Take 50 mg by mouth daily.   metFORMIN 1000 MG tablet Commonly known as:  GLUCOPHAGE Take 1,000 mg by mouth 2 (two) times daily with a meal.   metoprolol succinate 100 MG 24 hr tablet Commonly known as:  TOPROL-XL Take 100 mg by mouth daily. Take with or immediately following a meal.   omeprazole 20 MG capsule Commonly known as:  PRILOSEC Take 20 mg by mouth daily.   potassium chloride SA 20 MEQ tablet Commonly known as:  K-DUR Take 1 tablet (20 mEq total) by mouth daily.        OBJECTIVE:   Vital Signs: There were no vitals taken for this visit.  Wt Readings from Last 3 Encounters:  02/24/18 190 lb (86.2 kg)  02/23/18 190 lb (86.2 kg)  01/18/18 185 lb 3.2 oz (84 kg)     Exam: General:  Nancy [name] appears well and is in NAD  Lungs: Clear with good BS bilat with no rales, rhonchi, or wheezes  Heart: RRR with normal S1 and S2 and no gallops; no murmurs; no rub  Extremities: No pretibial edema.   Neuro: MS is good with appropriate affect, Nancy [name] is alert and Ox3    DM foot exam:(10/31/2017)  The skin of the feet is intact without sores or ulcerations. The pedal pulses are 2+ on right and 2+ on left. The sensation is intact to a screening 5.07, 10 gram monofilament bilaterally           DATA REVIEWED:   Lab Results  Component Value Date   LDLCALC 55 03/14/2018   CREATININE 1.02 (H) 09/23/2017    Results for Nancy, [name] (MRN [id number]) as of 11/22/2017 11:38  Ref. Range 11/22/2017 11:01  Hemoglobin A1C Latest Ref Range: 4.0 - 5.6 % 6.1 (A)     ASSESSMENT / PLAN / RECOMMENDATIONS:   1) Type 2 Diabetes Mellitus, Sub-optimally controlled, With neuropathic complications - Most recent A1c of 6.6 %. Goal A1c < 7.0 %.    Plan: - Praised patient on taking medications as prescribed and avoiding snacks as much as possible. - Nancy [name] had one hypoglycemia episode since her last visit, difficult to say if she has any more without more frequent checks. But her A1c has improved from 6.1 % to 6.6% - We will reduce Glipizide as below but if she has persistent BG's less then 100 mg/dL , she was advised to STOP the Glipizide.    MEDICATIONS:  Metfomrin 1000 mg BID  Glipizide 2.5 mg (Half a tablet) BID with meals   Januvia 100 mg daily   EDUCATION / INSTRUCTIONS:  BG monitoring instructions: Patient is instructed to check her blood sugars 2 times a day, fasting and bedtime.  Call [place] [facility] if: BG persistently < 70 or > 300. . I reviewed the Rule of 15 for the treatment of hypoglycemia in detail with the patient. Literature supplied.   2) Diabetic complications:  Eye: Does not have known diabetic retinopathy. Last eye exam was   Neuro/ Feet: Does   have known diabetic peripheral neuropathy.  Renal: Patient does  have known baseline CKD. She is on an ACEI/ARB at present.   3) Lipids: Patient is not on a statin.    4) Hypertension: She is at goal of < 140/90 mmHg.    F/U in 3 months   Signed electronically by: Mack Guise, MD  [facility] End[facility]  [address] [place], [place] [address] Phone: [phone] FAX: [phone]   CC:  Nancy Rossetti, NP (Inactive) [address] [place] [address] Phone: [phone]  Fax: [phone]  Return to Endocrinology clinic as below: Future Appointments  Date Time Provider [place]  05/24/2018 10:30 AM Shamleffer, Melanie Crazier, MD [facility] None  06/09/2018 11:15 AM Valora Piccolo Barnabas Lister, RD [facility]DM  07/31/2018 10:45 AM Trula Slade, DPM [facility]

## 2018-05-31 ENCOUNTER — Other Ambulatory Visit: Payer: Self-pay

## 2018-05-31 ENCOUNTER — Ambulatory Visit (INDEPENDENT_AMBULATORY_CARE_PROVIDER_SITE_OTHER): Payer: Medicare Other | Admitting: Internal Medicine

## 2018-05-31 ENCOUNTER — Encounter: Payer: Self-pay | Admitting: Internal Medicine

## 2018-05-31 VITALS — BP 138/72 | HR 75 | Temp 98.1°F | Ht 62.0 in | Wt 193.8 lb

## 2018-05-31 DIAGNOSIS — E1165 Type 2 diabetes mellitus with hyperglycemia: Secondary | ICD-10-CM

## 2018-05-31 DIAGNOSIS — E1142 Type 2 diabetes mellitus with diabetic polyneuropathy: Secondary | ICD-10-CM | POA: Diagnosis not present

## 2018-05-31 LAB — BASIC METABOLIC PANEL
BUN: 21 mg/dL (ref 6–23)
CO2: 25 mEq/L (ref 19–32)
Calcium: 9.9 mg/dL (ref 8.4–10.5)
Chloride: 101 mEq/L (ref 96–112)
Creatinine, Ser: 1.14 mg/dL (ref 0.40–1.20)
GFR: 56.04 mL/min — ABNORMAL LOW (ref >60.00)
Glucose, Bld: 175 mg/dL — ABNORMAL HIGH (ref 70–99)
Potassium: 4.2 mEq/L (ref 3.5–5.1)
Sodium: 138 mEq/L (ref 135–145)

## 2018-05-31 LAB — POCT GLYCOSYLATED HEMOGLOBIN (HGB A1C): Hemoglobin A1C: 7.2 % — AB (ref 4.0–5.6)

## 2018-05-31 LAB — VITAMIN B12: Vitamin B-12: 386 pg/mL (ref 211–911)

## 2018-05-31 NOTE — Patient Instructions (Signed)
-    Glipizide 2.5 mg (Half a Tablet) With Breakfast - Glipizide 5 mg ( 1 tablet) with supper  - Continue Metformin 1000 mg Twice a day - Continue Januvia 100 mg daily   - If your sugars are consistently below 100 mg/dL, Please stop the Glipizide all together and notify our office.   - HOW TO TREAT LOW BLOOD SUGARS (Blood sugar LESS THAN 70 MG/DL)  Please follow the RULE OF 15 for the treatment of hypoglycemia treatment (when your (blood sugars are less than 70 mg/dL)    STEP 1: Take 15 grams of carbohydrates when your blood sugar is low, which includes:   3-4 GLUCOSE TABS  OR  3-4 OZ OF JUICE OR REGULAR SODA OR  ONE TUBE OF GLUCOSE GEL     STEP 2: RECHECK blood sugar in 15 MINUTES STEP 3: If your blood sugar is still low at the 15 minute recheck --> then, go back to STEP 1 and treat AGAIN with another 15 grams of carbohydrates.

## 2018-05-31 NOTE — Progress Notes (Signed)
Name: Nancy [name]  Age/ Sex: 78 y.o., female   MRN/ DOB: [id number], 12/17/1940     PCP: Dianna Rossetti, NP (Inactive)   Reason for Endocrinology Evaluation: Type 2 Diabetes Mellitus  Initial Endocrine Consultative Visit: 10/31/2017    PATIENT IDENTIFIER: Nancy [name] is a 78 y.o. female with a past medical history of HTN, Gout, Hyperlipidemia, and T2 DM . The patient has followed with Endocrinology clinic since 10/31/17 for consultative assistance with management of her diabetes.  DIABETIC HISTORY:  N[place] [name] was diagnosed with T2DM ~ in 2009, she has been on oral glycemic agents since her diagnosis. Has not been on insulin prior to her presentation here. Her hemoglobin A1c has ranged from 7.3% in 09/2015, peaking at 8.6% in 08/2015.   SUBJECTIVE:   During the last visit (11/22/2017): Her A1c was 6.1%. She was continued on Metformin, and Januvia. We reduced Glipizide to half a tablet twice a day .  Today (05/31/2018): [place] [name] is here for a 3 month follow up on diabetes management. She checks her blood sugars 1 time daily.The patient has has not had hypoglycemic episodes since the last clinic visit. She has been noted with hyperglycemia,pt admits to dietary indiscretions, she was also out of glipizide for ~ 1 week but restarted again this week. She also stopped lipitor. Otherwise, the patient has not required any recent emergency interventions for hypoglycemia and has not had recent hospitalizations secondary to hyper or hypoglycemic episodes.   She is c/o feet hurting for the past few months, she also has back pain. She has numbness in her thigh and leg but not feet    ROS: As per HPI and as detailed below: Review of Systems  Constitutional: Negative for fever and weight loss.  HENT: Negative for congestion and sore throat.   Respiratory: Negative for cough and shortness of breath.   Cardiovascular: Positive for palpitations. Negative for chest pain.   Gastrointestinal: Negative for diarrhea and nausea.  Musculoskeletal: Positive for joint pain.  Skin: Negative for rash.      HOME DIABETES REGIMEN:   Metfomrin 1000 mg BID  Glipizide 2.5 mg BID with meals - was out for a week   Januvia 100 mg daily     METER DOWNLOAD SUMMARY: Date range evaluated: 4/21-5/20/20 Fingerstick Blood Glucose Tests =23 Average Number Tests/Day = 0.8 Overall Mean FS Glucose = 171 Standard Deviation = 38  BG Ranges: Low = 107 High = 246   Hypoglycemic Events/30 Days: BG < 50 = 0 Episodes of symptomatic severe hypoglycemia = 0      HISTORY:  Past Medical History:  Past Medical History:  Diagnosis Date  . Allergy   . Breast cyst    left breast  . Chest pain   . Complication of anesthesia 1986   slow to awaken after cholecystectomy  . Diastolic dysfunction    with mild pulmonary HTN by echo 07/2011  . Gastric ulcer   . GERD (gastroesophageal reflux disease)   . Gout   . Hyperlipidemia   . Hypertension   . 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 Osteoarthritis   . Osteopenia   . Shortness of breath dyspnea    with activity  . Spine deformity    lower spine  . Type II diabetes mellitus ([place])    Past Surgical History:  Past Surgical History:  Procedure Laterality Date  . ABDOMINAL ADHESION SURGERY    . ABDOMINAL HYSTERECTOMY    .  ANUS SURGERY     for torn tissue  . APPENDECTOMY    . BREAST BIOPSY    . BREAST LUMPECTOMY     left  . CHOLECYSTECTOMY  1986  . COLONOSCOPY    . ESOPHAGOGASTRODUODENOSCOPY (EGD) WITH PROPOFOL N/A 07/18/2013   Procedure: ESOPHAGOGASTRODUODENOSCOPY (EGD) WITH PROPOFOL;  Surgeon: Arta Silence, MD;  Location: [facility];  Service: Endoscopy;  Laterality: N/A;  . EUS N/A 07/18/2013   Procedure: ESOPHAGEAL ENDOSCOPIC ULTRASOUND (EUS) RADIAL;  Surgeon: Arta Silence, MD;  Location: [facility];  Service: Endoscopy;  Laterality: N/A;  . IR GENERIC HISTORICAL  10/30/2015   IR US GUIDE VASC ACCESS  RIGHT 10/30/2015 Saverio Danker, PA-C [facility]  . IR GENERIC HISTORICAL  10/30/2015   IR FLUORO GUIDE CV LINE RIGHT 10/30/2015 Saverio Danker, PA-C [facility]  . LUMBAR WOUND DEBRIDEMENT N/A 10/01/2015   Procedure: LUMBAR WOUND DEBRIDEMENT;  Surgeon: Melina Schools, MD;  Location: [place];  Service: Orthopedics;  Laterality: N/A;  . LUMBAR WOUND DEBRIDEMENT N/A 10/15/2015   Procedure: [facility] OUT AND CLOSURE OF BACK WOUND;  Surgeon: Melina Schools, MD;  Location: [place];  Service: Orthopedics;  Laterality: N/A;  . MASS EXCISION Left 07/24/2015   Procedure: EXCISION MASS LEFT LONG FINGER;  Surgeon: Leanora Cover, MD;  Location: [place];  Service: Orthopedics;  Laterality: Left;  . SPINAL FUSION N/A 09/17/2015   Procedure: FUSION POSTERIOR SPINAL MULTILEVEL L4- S1;  Surgeon: Melina Schools, MD;  Location: [place];  Service: Orthopedics;  Laterality: N/A;  . TRANSFORAMINAL LUMBAR INTERBODY FUSION (TLIF) WITH PEDICLE SCREW FIXATION 1 LEVEL N/A 09/17/2015   Procedure: TRANSFORAMINAL LUMBAR INTERBODY FUSION (TLIF) WITH PEDICLE SCREW FIXATION 1 LEVEL , Lumbar 4-5;  Surgeon: Melina Schools, MD;  Location: [place];  Service: Orthopedics;  Laterality: N/A;    Social History:  reports that she has never smoked. She has never used smokeless tobacco. She reports that she does not drink alcohol or use drugs. Family History:  Family History  Problem Relation Age of Onset  . Heart disease Mother   . Cancer Sister        ovarian     HOME MEDICATIONS: Allergies as of 05/31/2018      Reactions   Morphine And Related Swelling   10/02/15 - given Toradol + morphine and RN noted tongue swelling - given Epi + Benadryl Has had morphine in past w/o problem   Toradol [ketorolac Tromethamine] Swelling   10/02/15 - given Toradol + morphine and RN noted tongue swelling - given Epi + Benadryl Has taken ibuprofen in past w/o problem   Codeine Other (See Comments)   headache      Medication List       Accurate  as of May 31, 2018 11:15 AM. If you have any questions, ask your nurse or doctor.        allopurinol 100 MG tablet Commonly known as:  ZYLOPRIM Take 200 mg by mouth daily.   amLODipine 10 MG tablet Commonly known as:  NORVASC Take 10 mg by mouth every morning.   aspirin EC 81 MG tablet Take 81 mg by mouth daily.   atorvastatin 10 MG tablet Commonly known as:  LIPITOR Take 1 tablet (10 mg total) by mouth daily.   Colcrys 0.6 MG tablet Generic drug:  colchicine Take 0.6 mg by mouth daily as needed (Gout).   diclofenac sodium 1 % Gel Commonly known as:  VOLTAREN Apply 2 g topically 4 (four) times daily. Rub into affected  area of foot 2 to 4 times daily   Durezol 0.05 % Emul Generic drug:  Difluprednate   furosemide 20 MG tablet Commonly known as:  LASIX Take 1 tablet (20 mg total) by mouth daily.   gabapentin 300 MG capsule Commonly known as:  NEURONTIN Take 300 mg by mouth 3 (three) times daily.   glipiZIDE 5 MG tablet Commonly known as:  GLUCOTROL Take 2.5 mg by mouth 2 (two) times daily before a meal.   glucose blood test strip Commonly known as:  Contour Test Use as instructed   Januvia 100 MG tablet Generic drug:  sitaGLIPtin TAKE 1 TABLET BY MOUTH ONCE DAILY FOR 90 DAYS   losartan 50 MG tablet Commonly known as:  COZAAR Take 50 mg by mouth daily.   metFORMIN 1000 MG tablet Commonly known as:  GLUCOPHAGE Take 1,000 mg by mouth 2 (two) times daily with a meal.   metoprolol succinate 100 MG 24 hr tablet Commonly known as:  TOPROL-XL Take 100 mg by mouth daily. Take with or immediately following a meal.   omeprazole 20 MG capsule Commonly known as:  PRILOSEC Take 20 mg by mouth daily.   potassium chloride SA 20 MEQ tablet Commonly known as:  K-DUR Take 1 tablet (20 mEq total) by mouth daily.        OBJECTIVE:   Vital Signs: BP 138/72 (BP Location: Left Arm, Patient Position: Sitting, Cuff Size: Normal)   Pulse 75   Temp 98.1 F (36.7 C)    Ht 5\' 2"  (1.575 m)   Wt 193 lb 12.8 oz (87.9 kg)   SpO2 98%   BMI 35.45 kg/m   Wt Readings from Last 3 Encounters:  05/31/18 193 lb 12.8 oz (87.9 kg)  02/24/18 190 lb (86.2 kg)  02/23/18 190 lb (86.2 kg)     Exam: General: Pt appears well and is in NAD  Lungs: Clear with good BS bilat with no rales, rhonchi, or wheezes  Heart: RRR with normal S1 and S2 and no gallops; no murmurs; no rub  Extremities: Trace pretibial edema.   Neuro: MS is good with appropriate affect, pt is alert and Ox3    DM foot exam:(05/31/18)  The skin of the feet is intact without sores or ulcerations. The pedal pulses are 2+ on right and 2+ on left. The sensation is decreased  to a screening 5.07, 10 gram monofilament on the right.         DATA REVIEWED:  Lab Results  Component Value Date   LDLCALC 55 03/14/2018   CREATININE 1.02 (H) 09/23/2017      Results for Nancy [name], Nancy [name] (MRN [id number]) as of 05/31/2018 11:26  Ref. Range 05/31/2018 10:32  Hemoglobin A1C Latest Ref Range: 4.0 - 5.6 % 7.2 (A)    ASSESSMENT / PLAN / RECOMMENDATIONS:   1) Type 2 Diabetes Mellitus, Sub-optimally controlled, With neuropathic complications - Most recent A1c of 7.2%. Goal A1c < 7.0 %.    Plan:  - Pt noted with hyperglycemia, this is a combination of dietary indiscretions as well as running out of glipizide. - Despite an increase in her A1c, I prefer her to be on the higher side then to have hypoglycemia, as at her age, hypoglycemia is much more dangerous.   - Encouraged the importance of compliance and the benefits of glucose control  - Since she tends to eat at bedtime and her BG's have noted to be high in the morning will increase evening glipizide as below  MEDICATIONS:  Metfomrin 1000 mg BID  Glipizide 2.5 mg (Half a tablet) with Supper   Glipizide 5 mg with Supper   Januvia 100 mg daily   EDUCATION / INSTRUCTIONS:  BG monitoring instructions: Patient is instructed to check her blood  sugars 2 times a day, fasting and bedtime.  Call [facility] if: BG persistently < 70 or > 300. . I reviewed the Rule of 15 for the treatment of hypoglycemia in detail with the patient. Literature supplied.   2) Diabetic complications:   Eye: Does not have known diabetic retinopathy. Last eye exam was   Neuro/ Feet: Does  have known diabetic peripheral neuropathy.  Renal: Patient does  have known baseline CKD. She is on an ACEI/ARB at present.   3) Lipids: Patient is not on a statin. We discussed the cardiovascular benefits of statins. I have encouraged her to restart Lipitor.    4) Hypertension: She is at goal of < 140/90 mmHg.   5) Peripheral Neuropathy:  - This is multifactorial given back issues and diabetes. I will also check B12, since she has been on metformin for a long time and this could interfere with B12 absorption.     F/U in 3 months   Signed electronically by: Mack Guise, MD  [facility]  [address] [place], [place] [address] Phone: [phone] FAX: [phone]   CC:  Dianna Rossetti, NP (Inactive) [address] [place] [address] Phone: [phone]  Fax: [phone]  Return to Endocrinology clinic as below: Future Appointments  Date Time Provider [place]  06/09/2018 11:15 AM Clydell Hakim, RD [place] [facility]  07/31/2018 10:45 AM Trula Slade, DPM [facility]  08/30/2018 10:30 AM , Melanie Crazier, MD [facility] None

## 2018-06-09 ENCOUNTER — Encounter: Payer: Medicare Other | Attending: Internal Medicine | Admitting: Dietician

## 2018-06-09 DIAGNOSIS — E1165 Type 2 diabetes mellitus with hyperglycemia: Secondary | ICD-10-CM | POA: Diagnosis not present

## 2018-06-13 ENCOUNTER — Encounter: Payer: Self-pay | Admitting: Dietician

## 2018-06-13 NOTE — Progress Notes (Signed)
  Medical Nutrition Therapy:  Appt start time: 1115 end time:  1150. This was an in person visit  Assessment:  Primary concerns today: She is here today to follow up on her diabetes.  History includes:  Type 2 diabetes, OSA- on c-pap, GERD, HLD, HTN, and complains of increased neuropathy pain.   She states that her sleep continues to be poor and sleeps too late. She complains that her feet swell at times. A1C:  7.2% 05/31/18 increased from 6.6% 02/23/2018.  Increase was due to dietary indiscretion, being out of Glipizide for a week and increased pain. Medications include:  Metformin, glipizide, Januvia.  She states that she forgets the 2nd dose of glipizide at times. Labs 5/20 include:  Vitamin B-12=386  Patient's 2 daughters and granson lives with her and her grandieces moved out.  They share the shopping and cooking. Previously she stated that no one cooks and they get a lot of take out. Her meal schedule and therefore medication schedule remains an issue but patient states that she is working on this.   She states that she continues to decrease her sugar and sweetened beverage intake. She does not like raw vegetables.  She is working to reduce the fat content of her diet. She is trying to walk more and walks in her home about 20 times. 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  Preferred Learning Style:   No preference indicated   Learning Readiness:   Contemplating  Ready  Change in progress    DIETARY INTAKE: 24-hr recall:  B (9-10 AM): cereal occasionally or egg sandwich or grits and eggs  Snk ( AM):   L ( PM): skips often as breakfast is late Snk ( PM): prunes or yogurt or breakfast bar D ( PM):  Baked chicken, rice or pasta, vegetables OR phili cheese steak sandwich or chicken wings, or chicken nuggets, or burger or fish sandwich when eats out Snk ( PM):  Beverages: water, coffee (wish sugar?), rare regular lemonade  Usual physical activity: walking in her  home.    Progress Towards Goal(s):  In progress.   Nutritional Diagnosis:  NB-1.1 Food and nutrition-related knowledge deficit As related to balance of carbohydrates, protein, and fat.  As evidenced by diet hx and patient report.    Intervention:  Nutrition education continued related to type 2 diabetes, healthy nutrition and other for healthy BG control.   Called patient today 6/4 and discussed her vitamin B-12 further.  Consider a sublingual/disolvable vitamin B-12 supplement daily.  She had a headache and was not feeling well.  Teaching Method Utilized:  Auditory  Barriers to learning/adherence to lifestyle change: memory Demonstrated degree of understanding via:  Teach Back   Monitoring/Evaluation:  Dietary intake, exercise, and body weight in 2 month(s).

## 2018-06-28 ENCOUNTER — Other Ambulatory Visit: Payer: Self-pay | Admitting: Internal Medicine

## 2018-06-28 MED ORDER — METFORMIN HCL 1000 MG PO TABS: 1000.0000 mg | ORAL_TABLET | Freq: Two times a day (BID) | ORAL | 2 refills | Status: DC

## 2018-06-28 NOTE — Telephone Encounter (Signed)
MEDICATION: Metformin  PHARMACY:  [facility] : 90 day  IS PATIENT OUT OF MEDICATION: NO  IF NOT; HOW MUCH IS LEFT:  2-3 days left  LAST APPOINTMENT DATE: @5 /20/2020  NEXT APPOINTMENT DATE:@8 /19/2020  DO WE HAVE YOUR PERMISSION TO LEAVE A DETAILED MESSAGE: yes [phone]  OTHER COMMENTS:    **Let patient know to contact pharmacy at the end of the day to make sure medication is ready. **  ** Please notify patient to allow 48-72 hours to process**  **Encourage patient to contact the pharmacy for refills or they can request refills through [facility]**

## 2018-06-28 NOTE — Telephone Encounter (Signed)
Refill sent.  Nothing further needed  

## 2018-07-11 IMAGING — DX DG LUMBAR SPINE 2-3V
3 series · 3 of 3 positions shown · non-contrast
Comparison: Plain film 09/17/2015, CT 12/14/2012

CLINICAL DATA: 75-year-old female with a history of postop lumbar
surgery.

EXAM:
LUMBAR SPINE - 2-3 VIEW

[l-spine ap]
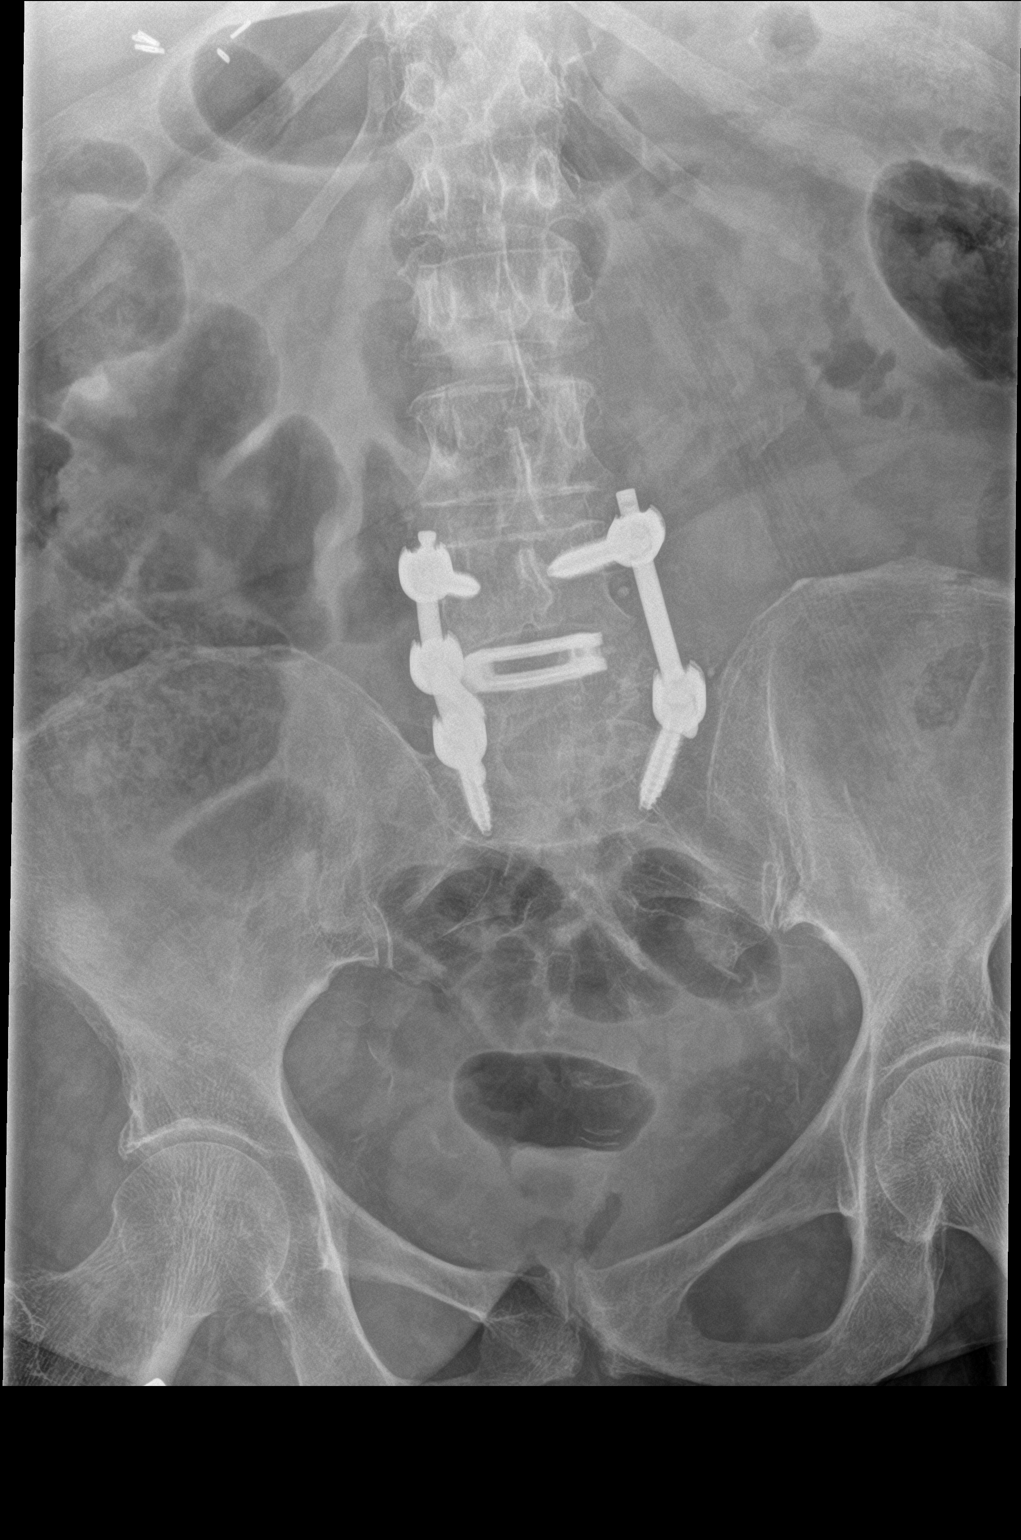

[l-spine lat]
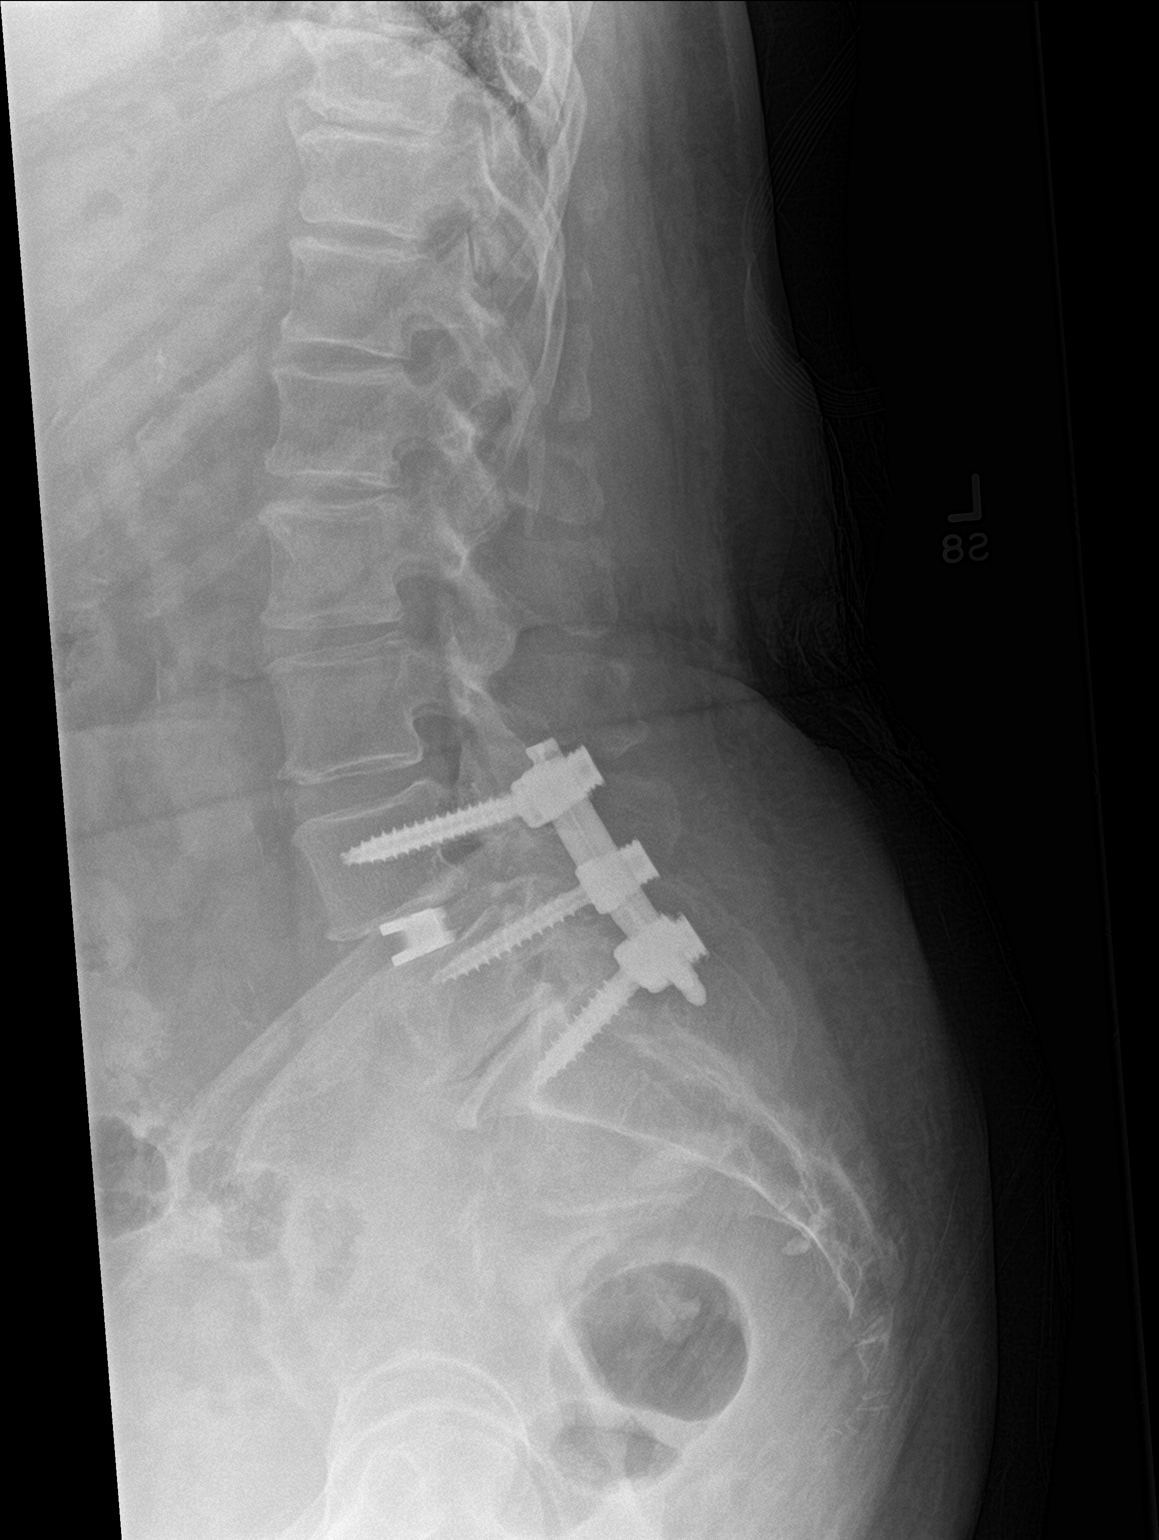

[l-spine spot]
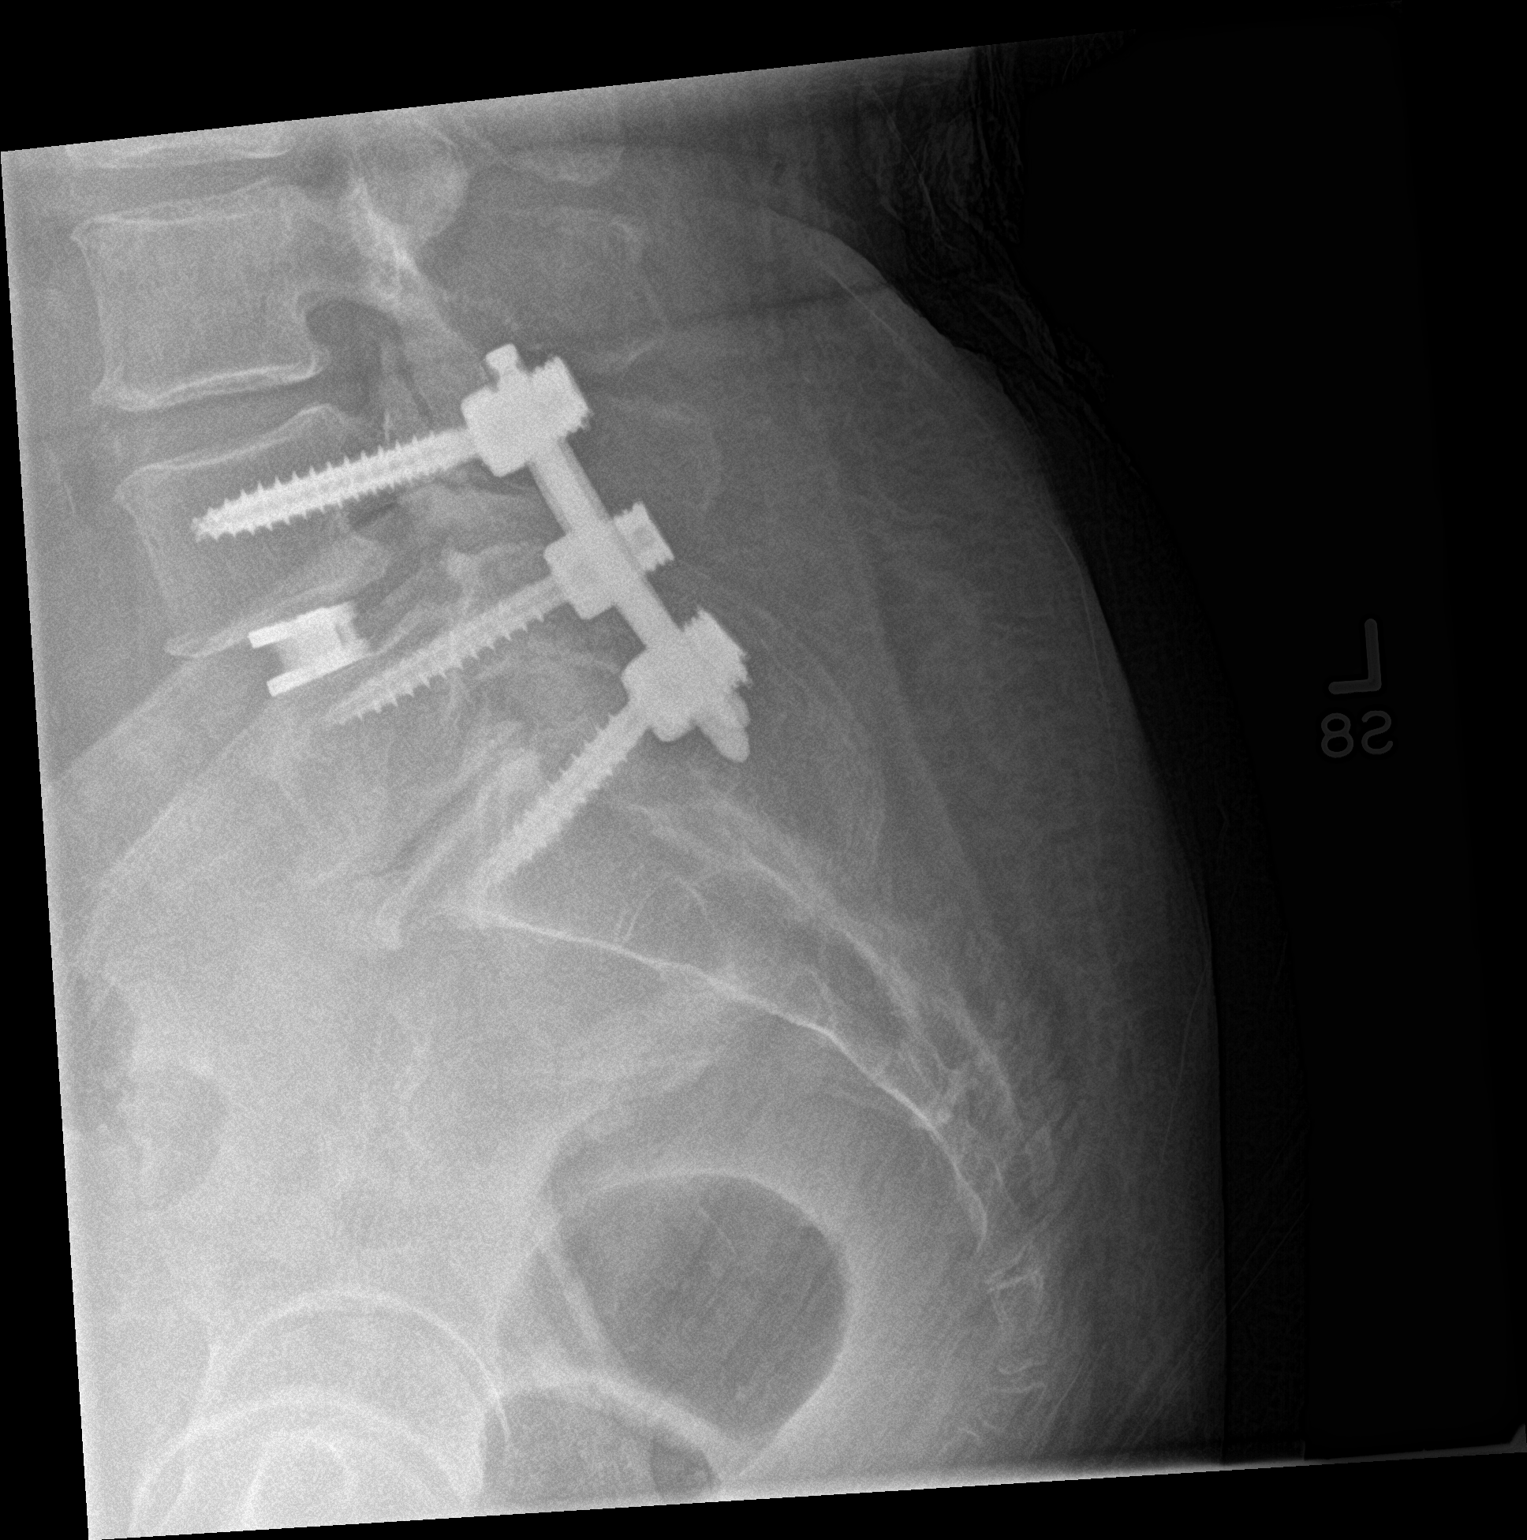

[3 of 3 positions shown; findings below may reference images not displayed]

FINDINGS: Surgical changes of posterior lumbar interbody fusion with bilateral
pedicle screw and rod fixation spanning L4-S1. There is unilateral
pedicle screw on the right at L5. Interbody spacer in place after
discectomy of L4-L5.

Alignment relatively maintained, with unchanged trace
anterolisthesis of L4 on L5.

Disc space narrowing with vacuum disc phenomenon at L5-S1 is
unchanged.

No complicating features.

No fracture identified.
IMPRESSION: Status post posterior lumbar interbody fusion spanning L4-S1 with
unilateral right L5 pedicle screw and interbody spacer at L4-L5. No
complicating features.

## 2018-07-19 ENCOUNTER — Other Ambulatory Visit: Payer: Self-pay

## 2018-07-19 ENCOUNTER — Telehealth: Payer: Self-pay | Admitting: Internal Medicine

## 2018-07-19 MED ORDER — CONTOUR TEST VI STRP: ORAL_STRIP | 12 refills | Status: DC

## 2018-07-19 NOTE — Telephone Encounter (Signed)
MEDICATION: Contour Test Strips  PHARMACY:  [facility] [place] : unknown  IS PATIENT OUT OF MEDICATION: no  IF NOT; HOW MUCH IS LEFT: 1 days worth  LAST APPOINTMENT DATE: @6 /17/2020  NEXT APPOINTMENT DATE:@8 /19/2020  DO WE HAVE YOUR PERMISSION TO LEAVE A DETAILED MESSAGE: yes  OTHER COMMENTS:    **Let patient know to contact pharmacy at the end of the day to make sure medication is ready. **  ** Please notify patient to allow 48-72 hours to process**  **Encourage patient to contact the pharmacy for refills or they can request refills through [facility]**

## 2018-07-26 ENCOUNTER — Other Ambulatory Visit: Payer: Self-pay

## 2018-07-26 ENCOUNTER — Telehealth: Payer: Self-pay | Admitting: Internal Medicine

## 2018-07-26 MED ORDER — CONTOUR NEXT TEST VI STRP: ORAL_STRIP | 12 refills | Status: DC

## 2018-07-26 MED ORDER — CONTOUR NEXT ONE KIT: 1.0000 | PACK | Freq: Every day | 0 refills | Status: DC

## 2018-07-26 NOTE — Telephone Encounter (Signed)
Patient has picked up her test strips RX but state it is not for her contour next meter they are just for a contour meter.  Please Advise, Thanks  While patient was on the phone I advised with Olen Cordial and he stated they are the same. With further research we now see they are not.

## 2018-07-26 NOTE — Telephone Encounter (Signed)
Pt chart has been updated and new rx sent to pharmacy

## 2018-07-28 ENCOUNTER — Ambulatory Visit: Payer: Medicare Other | Admitting: Dietician

## 2018-07-31 ENCOUNTER — Ambulatory Visit (INDEPENDENT_AMBULATORY_CARE_PROVIDER_SITE_OTHER): Payer: Medicare Other | Admitting: Podiatry

## 2018-07-31 ENCOUNTER — Other Ambulatory Visit: Payer: Self-pay

## 2018-07-31 ENCOUNTER — Encounter: Payer: Self-pay | Admitting: Podiatry

## 2018-07-31 VITALS — Temp 98.2°F

## 2018-07-31 DIAGNOSIS — IMO0002 Reserved for concepts with insufficient information to code with codable children: Secondary | ICD-10-CM

## 2018-07-31 DIAGNOSIS — B351 Tinea unguium: Secondary | ICD-10-CM | POA: Diagnosis not present

## 2018-07-31 DIAGNOSIS — E118 Type 2 diabetes mellitus with unspecified complications: Secondary | ICD-10-CM | POA: Diagnosis not present

## 2018-07-31 DIAGNOSIS — M79675 Pain in left toe(s): Secondary | ICD-10-CM | POA: Diagnosis not present

## 2018-07-31 DIAGNOSIS — E1165 Type 2 diabetes mellitus with hyperglycemia: Secondary | ICD-10-CM

## 2018-07-31 DIAGNOSIS — M79674 Pain in right toe(s): Secondary | ICD-10-CM | POA: Diagnosis not present

## 2018-07-31 NOTE — Progress Notes (Signed)
Subjective: 78 y.o. returns the office today for painful, elongated, thickened toenails which she cannot trim herself. Denies any redness or drainage around the nails. She is still experiencing the neuropathy pain and on gabapentin but wants to hold other medications.  She still is tingling to her feet.  She also describes some discomfort in the ball of her foot which is been a chronic issue.  No recent injury or falls or increase in swelling.  Denies any systemic complaints such as fevers, chills, nausea, vomiting.   PCP: Dianna Rossetti, NP A1c: 7.2   Objective: AAO 3, NAD DP/PT pulses palpable, CRT less than 3 seconds Sensation decreased with SWMF.  Nails hypertrophic, dystrophic, elongated, brittle, discolored 10. There is tenderness overlying the nails 1-5 bilaterally. There is no surrounding erythema or drainage along the nail sites. No open lesions or pre-ulcerative lesions are identified. No discomfort submetatarsal bilaterally.  No area pinpoint tenderness.  There is no pain today this is where she gets discomfort. No other areas of tenderness bilateral lower extremities. No overlying edema, erythema, increased warmth. No pain with calf compression, swelling, warmth, erythema.  Assessment: Patient presents with symptomatic onychomycosis; metatarsalgia  Plan: -Treatment options including alternatives, risks, complications were discussed -Nails sharply debrided 10 without complication/bleeding. -Continue current dose of gabapentin for now.  -Diabetic shoes are fitting well.  We did modify them to add a metatarsal pad. -Discussed daily foot inspection. If there are any changes, to call the office immediately.  -Follow-up in 3 months or sooner if any problems are to arise. In the meantime, encouraged to call the office with any questions, concerns, changes symptoms.  Celesta Gentile, DPM

## 2018-08-11 ENCOUNTER — Ambulatory Visit: Payer: Medicare Other | Admitting: Dietician

## 2018-08-24 ENCOUNTER — Telehealth: Payer: Self-pay

## 2018-08-24 MED ORDER — GLUCOSE BLOOD VI STRP: ORAL_STRIP | 12 refills | Status: DC

## 2018-08-24 MED ORDER — ONETOUCH VERIO IQ SYSTEM W/DEVICE KIT: 1.0000 | PACK | Freq: Two times a day (BID) | 0 refills | Status: AC

## 2018-08-24 NOTE — Telephone Encounter (Signed)
Fax from pharmacy contour not covered accu chek or one touch preferred

## 2018-08-28 ENCOUNTER — Other Ambulatory Visit: Payer: Self-pay

## 2018-08-30 ENCOUNTER — Ambulatory Visit (INDEPENDENT_AMBULATORY_CARE_PROVIDER_SITE_OTHER): Payer: Medicare Other | Admitting: Internal Medicine

## 2018-08-30 ENCOUNTER — Other Ambulatory Visit: Payer: Self-pay

## 2018-08-30 ENCOUNTER — Encounter: Payer: Self-pay | Admitting: Internal Medicine

## 2018-08-30 VITALS — BP 118/62 | HR 72 | Temp 98.3°F | Ht 62.0 in | Wt 193.4 lb

## 2018-08-30 DIAGNOSIS — E1142 Type 2 diabetes mellitus with diabetic polyneuropathy: Secondary | ICD-10-CM

## 2018-08-30 DIAGNOSIS — E1165 Type 2 diabetes mellitus with hyperglycemia: Secondary | ICD-10-CM | POA: Diagnosis not present

## 2018-08-30 LAB — POCT GLYCOSYLATED HEMOGLOBIN (HGB A1C): Hemoglobin A1C: 6.2 % — AB (ref 4.0–5.6)

## 2018-08-30 NOTE — Progress Notes (Signed)
`     Name: Nancy [name]  Age/ Sex: 78 y.o., female   MRN/ DOB: [id number], 1940/05/02     PCP: Buzzy Han, MD   Reason for Endocrinology Evaluation: Type 2 Diabetes Mellitus  Initial Endocrine Consultative Visit: 10/31/2017    PATIENT IDENTIFIER: [name] is a 78 y.o. female with a past medical history of HTN, Gout, Hyperlipidemia, and T2 DM . The patient has followed with Endocrinology clinic since 10/31/17 for consultative assistance with management of her diabetes.  DIABETIC HISTORY:  [name] was diagnosed with T2DM ~ in 2009, she has been on oral glycemic agents since her diagnosis. Has not been on insulin prior to her presentation here. Her hemoglobin A1c has ranged from 7.3% in 09/2015, peaking at 8.6% in 08/2015.   SUBJECTIVE:   During the last visit (05/30/2017): Her A1c was 7.2%. She was continued on Metformin, and Januvia and adjusted Glipizide dose    Today (08/30/2018): [name] is here for a 3 month follow up on diabetes management. She checks her blood sugars 1 time daily.The patient has had had hypoglycemic episodes since the last clinic visit.The hypoglycemic episodes have been noted around noon. Otherwise, the patient has not required any recent emergency interventions for hypoglycemia and has not had recent hospitalizations secondary to hyper or hypoglycemic episodes.    ROS: As per HPI and as detailed below: Review of Systems  Constitutional: Positive for malaise/fatigue. Negative for fever and weight loss.  HENT: Negative for congestion and sore throat.   Respiratory: Negative for cough and shortness of breath.   Cardiovascular: Negative for chest pain.  Gastrointestinal: Negative for diarrhea and nausea.  Musculoskeletal: Positive for joint pain.  Skin: Negative for rash.      HOME DIABETES REGIMEN:   Metfomrin 1000 mg BID  Glipizide 2.5 mg with Breakfast and 5 mg with supper   Januvia 100 mg daily     METER  DOWNLOAD SUMMARY: Date range evaluated: 8/6-8/19/20 Fingerstick Blood Glucose Tests = 14 Average Number Tests/Day = 1.0 Overall Mean FS Glucose = 130  BG Ranges: Low = 77 High = 191   Hypoglycemic Events/30 Days: BG < 50 = 0 Episodes of symptomatic severe hypoglycemia = 0      HISTORY:  Past Medical History:  Past Medical History:  Diagnosis Date  . Allergy   . Breast cyst    left breast  . Chest pain   . Complication of anesthesia 1986   slow to awaken after cholecystectomy  . Diastolic dysfunction    with mild pulmonary HTN by echo 07/2011  . Gastric ulcer   . GERD (gastroesophageal reflux disease)   . Gout   . Hyperlipidemia   . Hypertension   . 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 Osteoarthritis   . Osteopenia   . Shortness of breath dyspnea    with activity  . Spine deformity    lower spine  . Type II diabetes mellitus ([place])    Past Surgical History:  Past Surgical History:  Procedure Laterality Date  . ABDOMINAL ADHESION SURGERY    . ABDOMINAL HYSTERECTOMY    . ANUS SURGERY     for torn tissue  . APPENDECTOMY    . BREAST BIOPSY    . BREAST LUMPECTOMY     left  . CHOLECYSTECTOMY  1986  . COLONOSCOPY    . ESOPHAGOGASTRODUODENOSCOPY (EGD) WITH PROPOFOL N/A 07/18/2013   Procedure: ESOPHAGOGASTRODUODENOSCOPY (EGD) WITH PROPOFOL;  Surgeon: Gwyndolyn Saxon  Paulita Fujita, MD;  Location: [facility];  Service: Endoscopy;  Laterality: N/A;  . EUS N/A 07/18/2013   Procedure: ESOPHAGEAL ENDOSCOPIC ULTRASOUND (EUS) RADIAL;  Surgeon: Arta Silence, MD;  Location: [facility];  Service: Endoscopy;  Laterality: N/A;  . IR GENERIC HISTORICAL  10/30/2015   IR US GUIDE VASC ACCESS RIGHT 10/30/2015 Saverio Danker, PA-C [facility]  . IR GENERIC HISTORICAL  10/30/2015   IR FLUORO GUIDE CV LINE RIGHT 10/30/2015 Saverio Danker, PA-C [facility]  . LUMBAR WOUND DEBRIDEMENT N/A 10/01/2015   Procedure: LUMBAR WOUND DEBRIDEMENT;  Surgeon: Melina Schools, MD;  Location: [place];   Service: Orthopedics;  Laterality: N/A;  . LUMBAR WOUND DEBRIDEMENT N/A 10/15/2015   Procedure: [facility] OUT AND CLOSURE OF BACK WOUND;  Surgeon: Melina Schools, MD;  Location: [place];  Service: Orthopedics;  Laterality: N/A;  . MASS EXCISION Left 07/24/2015   Procedure: EXCISION MASS LEFT LONG FINGER;  Surgeon: Leanora Cover, MD;  Location: [place];  Service: Orthopedics;  Laterality: Left;  . SPINAL FUSION N/A 09/17/2015   Procedure: FUSION POSTERIOR SPINAL MULTILEVEL L4- S1;  Surgeon: Melina Schools, MD;  Location: [place];  Service: Orthopedics;  Laterality: N/A;  . TRANSFORAMINAL LUMBAR INTERBODY FUSION (TLIF) WITH PEDICLE SCREW FIXATION 1 LEVEL N/A 09/17/2015   Procedure: TRANSFORAMINAL LUMBAR INTERBODY FUSION (TLIF) WITH PEDICLE SCREW FIXATION 1 LEVEL , Lumbar 4-5;  Surgeon: Melina Schools, MD;  Location: [place];  Service: Orthopedics;  Laterality: N/A;    Social History:  reports that she has never smoked. She has never used smokeless tobacco. She reports that she does not drink alcohol or use drugs. Family History:  Family History  Problem Relation Age of Onset  . Heart disease Mother   . Cancer Sister        ovarian     HOME MEDICATIONS: Allergies as of 08/30/2018      Reactions   Morphine And Related Swelling   10/02/15 - given Toradol + morphine and RN noted tongue swelling - given Epi + Benadryl Has had morphine in past w/o problem   Toradol [ketorolac Tromethamine] Swelling   10/02/15 - given Toradol + morphine and RN noted tongue swelling - given Epi + Benadryl Has taken ibuprofen in past w/o problem   Codeine Other (See Comments)   headache      Medication List       Accurate as of August 30, 2018 11:38 AM. If you have any questions, ask your nurse or doctor.        allopurinol 100 MG tablet Commonly known as: ZYLOPRIM Take 200 mg by mouth daily.   amLODipine 10 MG tablet Commonly known as: NORVASC Take 10 mg by mouth every morning.   aspirin EC 81 MG  tablet Take 81 mg by mouth daily.   atorvastatin 10 MG tablet Commonly known as: LIPITOR Take 1 tablet (10 mg total) by mouth daily.   Colcrys 0.6 MG tablet Generic drug: colchicine Take 0.6 mg by mouth daily as needed (Gout).   diclofenac sodium 1 % Gel Commonly known as: VOLTAREN Apply 2 g topically 4 (four) times daily. Rub into affected area of foot 2 to 4 times daily   Durezol 0.05 % Emul Generic drug: Difluprednate   furosemide 20 MG tablet Commonly known as: LASIX Take 1 tablet (20 mg total) by mouth daily.   gabapentin 300 MG capsule Commonly known as: NEURONTIN Take 300 mg by mouth 3 (three) times daily.   glipiZIDE 5 MG tablet Commonly  known as: GLUCOTROL Take 2.5 mg by mouth 2 (two) times daily before a meal.   glucose blood test strip Use as instructed to test blood sugars 2 times daily E11.65   Januvia 100 MG tablet Generic drug: sitaGLIPtin TAKE 1 TABLET BY MOUTH ONCE DAILY FOR 90 DAYS   losartan 50 MG tablet Commonly known as: COZAAR Take 50 mg by mouth daily.   metFORMIN 1000 MG tablet Commonly known as: GLUCOPHAGE Take 1 tablet (1,000 mg total) by mouth 2 (two) times daily with a meal.   metoprolol succinate 100 MG 24 hr tablet Commonly known as: TOPROL-XL Take 100 mg by mouth daily. Take with or immediately following a meal.   omeprazole 20 MG capsule Commonly known as: PRILOSEC Take 20 mg by mouth daily.   OneTouch Verio IQ System w/Device Kit 1 kit by Does not apply route 2 (two) times daily. E11.65   potassium chloride SA 20 MEQ tablet Commonly known as: K-DUR Take 1 tablet (20 mEq total) by mouth daily.        OBJECTIVE:   Vital Signs: BP 118/62 (BP Location: Left Arm, Patient Position: Sitting, Cuff Size: Large)   Pulse 72   Temp 98.3 F (36.8 C)   Ht 5' 2"  (1.575 m)   Wt 193 lb 6.4 oz (87.7 kg)   SpO2 96%   BMI 35.37 kg/m   Wt Readings from Last 3 Encounters:  08/30/18 193 lb 6.4 oz (87.7 kg)  05/31/18 193 lb 12.8 oz  (87.9 kg)  02/24/18 190 lb (86.2 kg)     Exam: General: Pt appears well and is in NAD  Lungs: Clear with good BS bilat with no rales, rhonchi, or wheezes  Heart: RRR with normal S1 and S2 and no gallops; no murmurs; no rub  Extremities: Trace pretibial edema.   Neuro: MS is good with appropriate affect, pt is alert and Ox3    DM foot exam:(05/31/18)  The skin of the feet is intact without sores or ulcerations. The pedal pulses are 2+ on right and 2+ on left. The sensation is decreased  to a screening 5.07, 10 gram monofilament on the right.         DATA REVIEWED:  Lab Results  Component Value Date   LDLCALC 55 03/14/2018   CREATININE 1.14 05/31/2018      Results for AZRIELLA, MATTIA (MRN [id number]) as of 08/30/2018 11:41  Ref. Range 02/23/2018 10:51 05/31/2018 10:32 08/30/2018 10:41  Hemoglobin A1C Latest Ref Range: 4.0 - 5.6 % 6.6 (A) 7.2 (A) 6.2 (A)    ASSESSMENT / PLAN / RECOMMENDATIONS:   1) Type 2 Diabetes Mellitus, With Neuropathic complications - Most recent A1c of 6.2 %. Goal A1c < 7.0 %.     - Pt with low A1c which is a concern for recurrent hypoglycemia , which the patient endorses some, these has been happening during the day, the patient wakes up late in the day and doesn't eat much until night time where she eats all her meals then.  - Given the above information, I will stop her half a tablet of Glipizide in the morning and continue the evening dose of glipizide. Metformin and Januvia carry low risk for hypoglycemia.    MEDICATIONS:  Metfomrin 1000 mg BID  Glipizide 5 mg with Supper   Januvia 100 mg daily   EDUCATION / INSTRUCTIONS:  BG monitoring instructions: Patient is instructed to check her blood sugars 2 times a day, fasting and bedtime.  Call [facility]  Endocrinology clinic if: BG persistently < 70 or > 300. . I reviewed the Rule of 15 for the treatment of hypoglycemia in detail with the patient. Literature supplied.   2) Diabetic  complications:   Eye: Does not have known diabetic retinopathy. Last eye exam was   Neuro/ Feet: Does  have known diabetic peripheral neuropathy.  Renal: Patient does  have known baseline CKD. She is on an ACEI/ARB at present.     F/U in 4 months   Signed electronically by: Mack Guise, MD  [facility]  [facility] [address] [place], [place] [address] Phone: [phone] FAX: [phone]   CC:  Buzzy Han, [place] [address] Phone: [phone]  Fax: [phone]  Return to Endocrinology clinic as below: Future Appointments  Date Time Provider [place]  09/01/2018  2:00 PM Clydell Hakim, RD [facility]  11/02/2018 10:15 AM Trula Slade, DPM [facility]  12/28/2018 10:30 AM Lekha Dancer, Melanie Crazier, MD [facility] None

## 2018-08-30 NOTE — Patient Instructions (Signed)
 -   Glipizide 5 mg ( 1 tablet) with supper  - STOP the Morning dose of Glipizide  - Continue Metformin 1000 mg Twice a day - Continue Januvia 100 mg daily    - HOW TO TREAT LOW BLOOD SUGARS (Blood sugar LESS THAN 70 MG/DL)  Please follow the RULE OF 15 for the treatment of hypoglycemia treatment (when your (blood sugars are less than 70 mg/dL)    STEP 1: Take 15 grams of carbohydrates when your blood sugar is low, which includes:   3-4 GLUCOSE TABS  OR  3-4 OZ OF JUICE OR REGULAR SODA OR  ONE TUBE OF GLUCOSE GEL     STEP 2: RECHECK blood sugar in 15 MINUTES STEP 3: If your blood sugar is still low at the 15 minute recheck --> then, go back to STEP 1 and treat AGAIN with another 15 grams of carbohydrates.

## 2018-09-01 ENCOUNTER — Other Ambulatory Visit: Payer: Self-pay

## 2018-09-01 ENCOUNTER — Encounter: Payer: Self-pay | Admitting: Dietician

## 2018-09-01 ENCOUNTER — Encounter: Payer: Medicare Other | Attending: Internal Medicine | Admitting: Dietician

## 2018-09-01 DIAGNOSIS — E1165 Type 2 diabetes mellitus with hyperglycemia: Secondary | ICD-10-CM | POA: Insufficient documentation

## 2018-09-01 DIAGNOSIS — E1142 Type 2 diabetes mellitus with diabetic polyneuropathy: Secondary | ICD-10-CM

## 2018-09-01 NOTE — Patient Instructions (Signed)
Be consistent with your exercise.  Walking when it is cool.  Do light weights, chair exercises, etc.  Find motivation.  Aim for 30 minutes per day. You can make an appointment at the [facility] to use the pool.  Try to get a routine for sleep. Try to be more consistent with your meals. Try stevia in your coffee rather than sugar. Continue your vitamin B-12.  Fine one that dissolves in your mouth next time you buy it.  Continue to take your medication as prescribed. Great job on continuing to drink more water rather than sugar beverages and baking rather than frying.

## 2018-09-01 NOTE — Progress Notes (Signed)
Medical Nutrition Therapy:  Appt start time: 1410 end time:  [id number].  Assessment:  Primary concerns today: Patient is here today alone for follow up for type 2 diabetes. She states that she is tired of hurting all of the time (back, neuropathy).  Complains of swollen feet. Walking at times and a few hand weights but reports a lack of motivation. She does not sleep well, has a problem going to sleep and play's games on her tablet until morning. Sleep 4 am-8:30 am. Weight 190 lbs decreased from 194 lbs 9/18.  Medications include Januvia, glipizide, metformin, vitamin B-12 She does not have a consistent schedule for sleep or eating and has had low BG at times.  Her glipizide am dose was discontinued today.   A1C 6.2% 08/30/18 decreased from 7.2% 05/2018 but concerned that this is due to increased hypoglycemia.  Patient's 2 daughters and granson lives with her and her grandieces moved out.  They share the shopping and cooking. Previously she stated that no one cooks and they get a lot of take out. Her meal schedule and therefore medication schedule remains an issue but patient states that she is working on this.   She states that she continues to decrease her sugar and sweetened beverage intake. She does not like raw vegetables.  She is working to reduce the fat content of her diet. She is trying to walk more and walks in her home about 20 times. 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  Preferred Learning Style:   No preference indicated   Learning Readiness:   Ready  Change in progress   DIETARY INTAKE: Breakfast:  9 am:  Skips often Lunch:  1 pm:  Cereal (Rice [employer]pies), milk OR egg sandwich OR grits, egg, occasional sausage or bacon, toast OR bologna sandwich Snack:  Occasional clementine or other fruit Dinner (9pm):  Baked chicken, mixed vegetables or mashed potatoes or rice OR baked spaghetti OR tuna salad sandwich OR PB&Jelly sandwich, rare chips Snack:  Occasional oatmeal  cream pie or fruit  Beverages:  Water, unsweetenedt tea mixed with emonade, occasional coffee with sugar 3 tsp and regular creamer-   Walking and a few hand weights but lacks motivation.   Estimated energy needs: 1400 calories 158 g carbohydrates 88 g protein 47 g fat  Progress Towards Goal(s):  In progress.   Nutritional Diagnosis:  NB-1.1 Food and nutrition-related knowledge deficit As related to balance of carbohydrate, protein, and fat.  As evidenced by diet hx and patient report.    Intervention:  Nutrition education continued related to type 2 diabetes and healthy nutrition.  Discussed need for balanced meals and a regular sleep and meal schedule.  Discussed exercise and motivation.  Discussed beverages and benefits of not drinking carbohydrates.  Teaching Method Utilized:  Auditory  Barriers to learning/adherence to lifestyle change: motivation  Demonstrated degree of understanding via:  Teach Back   Monitoring/Evaluation:  Dietary intake, exercise, and body weight in 3 month(s).

## 2018-09-04 ENCOUNTER — Other Ambulatory Visit: Payer: Self-pay

## 2018-09-04 MED ORDER — METFORMIN HCL 1000 MG PO TABS: 1000.0000 mg | ORAL_TABLET | Freq: Two times a day (BID) | ORAL | 1 refills | Status: DC

## 2018-11-02 ENCOUNTER — Other Ambulatory Visit: Payer: Self-pay

## 2018-11-02 ENCOUNTER — Ambulatory Visit (INDEPENDENT_AMBULATORY_CARE_PROVIDER_SITE_OTHER): Payer: Medicare Other | Admitting: Podiatry

## 2018-11-02 DIAGNOSIS — G629 Polyneuropathy, unspecified: Secondary | ICD-10-CM | POA: Diagnosis not present

## 2018-11-02 DIAGNOSIS — M79675 Pain in left toe(s): Secondary | ICD-10-CM

## 2018-11-02 DIAGNOSIS — E1149 Type 2 diabetes mellitus with other diabetic neurological complication: Secondary | ICD-10-CM

## 2018-11-02 DIAGNOSIS — M79674 Pain in right toe(s): Secondary | ICD-10-CM | POA: Diagnosis not present

## 2018-11-02 DIAGNOSIS — B351 Tinea unguium: Secondary | ICD-10-CM

## 2018-11-02 NOTE — Progress Notes (Signed)
Subjective: 78 y.o. returns the office today for painful, elongated, thickened toenails which she cannot trim herself. Denies any redness or drainage around the nails.  She states that she is still getting neuropathy.  She increase the gabapentin to 900 mg 3 times a day has been somewhat helpful but she is asked if there is any other options. Denies any systemic complaints such as fevers, chills, nausea, vomiting.   A1c: 6.2 (08/30/2018)  Objective: AAO 3, NAD DP/PT pulses palpable, CRT less than 3 seconds Sensation mildly decreased with SWMF.  Nails hypertrophic, dystrophic, elongated, brittle, discolored 10. There is tenderness overlying the nails 1-5 bilaterally. There is no surrounding erythema or drainage along the nail sites. No open lesions or pre-ulcerative lesions are identified. No pain with calf compression, swelling, warmth, erythema.  Assessment: Patient presents with symptomatic onychomycosis; neuropathy   Plan: -Treatment options including alternatives, risks, complications were discussed -Nails sharply debrided 10 without complication/bleeding. -Aside from gabapentin discussed other treatment options for neuropathy including switching to possibly Lyrica we also discussed alternatives including doing neurogenix again as that was helpful previously. Also discussed possible neurology of pain management consult.  She is to consider her options.  Return in about 3 months (around 02/02/2019).  Trula Slade DPM

## 2018-11-08 ENCOUNTER — Other Ambulatory Visit: Payer: Self-pay | Admitting: Cardiology

## 2018-11-09 MED ORDER — POTASSIUM CHLORIDE CRYS ER 20 MEQ PO TBCR: 20.0000 meq | EXTENDED_RELEASE_TABLET | Freq: Every day | ORAL | 0 refills | Status: DC

## 2018-11-09 NOTE — Addendum Note (Signed)
Addended by: Derl Barrow on: 11/09/2018 02:30 PM   Modules accepted: Orders

## 2018-11-10 ENCOUNTER — Telehealth: Payer: Self-pay

## 2018-11-10 MED ORDER — POTASSIUM CHLORIDE CRYS ER 20 MEQ PO TBCR: 20.0000 meq | EXTENDED_RELEASE_TABLET | Freq: Every day | ORAL | 0 refills | Status: DC

## 2018-11-10 NOTE — Telephone Encounter (Signed)
RX sent

## 2018-11-28 ENCOUNTER — Telehealth: Payer: Self-pay | Admitting: Cardiology

## 2018-11-28 NOTE — Telephone Encounter (Signed)
Per Pt she was calling to schedule an appt with Dr. Ginny Forth virtual visit tomorrow.  Pt declines.  States she is not having any pain or anything.  Offered next available in January.  Pt accepts and states she will call if she needs anything more urgently.

## 2018-11-28 NOTE — Telephone Encounter (Signed)
New Message  Pt c/o of Chest Pain: STAT if CP now or developed within 24 hours  1. Are you having CP right now? No  2. Are you experiencing any other symptoms (ex. SOB, nausea, vomiting, sweating)? SOB, little lightheadedness  3. How long have you been experiencing CP? Off and on; last since 11/26/18  4. Is your CP continuous or coming and going? Coming and going  5. Have you taken Nitroglycerin? No ?

## 2018-12-20 ENCOUNTER — Ambulatory Visit: Payer: Self-pay

## 2018-12-20 ENCOUNTER — Encounter: Payer: Self-pay | Admitting: Orthopedic Surgery

## 2018-12-20 ENCOUNTER — Other Ambulatory Visit: Payer: Self-pay

## 2018-12-20 ENCOUNTER — Ambulatory Visit (INDEPENDENT_AMBULATORY_CARE_PROVIDER_SITE_OTHER): Payer: Medicare Other | Admitting: Orthopedic Surgery

## 2018-12-20 VITALS — Ht 61.5 in | Wt 185.0 lb

## 2018-12-20 DIAGNOSIS — M170 Bilateral primary osteoarthritis of knee: Secondary | ICD-10-CM

## 2018-12-20 DIAGNOSIS — M25561 Pain in right knee: Secondary | ICD-10-CM

## 2018-12-22 ENCOUNTER — Encounter: Payer: Self-pay | Admitting: Orthopedic Surgery

## 2018-12-22 NOTE — Progress Notes (Signed)
Office Visit Note   Patient: Nancy [name]           Date of Birth: 05/13/1940           MRN: [id number] Visit Date: 12/20/2018 Requested by: Buzzy Han, MD [place],  [place] [id number] PCP: Buzzy Han, MD  Subjective: No chief complaint on file.   HPI: Nancy [name] is a 78 y.o. female who presents to the office complaining of right knee pain.  Patient notes worsening bilateral knee pain (right greater than left) since last week when she bent down to pick something up.  She has been ambulating with a cane.  She notes pain that she localizes to the medial aspect of bilateral knees.  Her knees feel weak and give way on occasion.  She denies any mechanical symptoms but notes swelling and stiffness of the knees.  Pain does not wake her up at night and does not bother her at rest.  Pain is worst when going from a sitting to a standing position.  Her last knee injection was a right knee injection in March 2019 which provided her significant length of time of relief.  She denies any groin pain but does note some buttocks pain with long periods of standing that she describes as a dull pain.  She denies any radicular pain down the leg.  She has been taking gabapentin but no anti-inflammatories for pain relief.                ROS:  All systems reviewed are negative as they relate to the chief complaint within the history of present illness.  Patient denies fevers or chills.  Assessment & Plan: Visit Diagnoses:  1. Primary osteoarthritis of both knees   2. Right knee pain, unspecified chronicity     Plan: Patient is a 78 year old female who presents complaining of bilateral knee pain.  She localizes the majority of her pain to the medial aspect of her right knees.  She has tenderness to palpation over the medial joint lines bilaterally.  X-rays taken today reveal no fracture dislocation but do reveal significant medial compartment arthritis  bilaterally.  Offered cortisone injection of bilateral knees.  Patient accepted and tolerated the procedure well.  Patient's knees were aspirated of joint effusions as well.  Patient will follow up with the office as needed.  Follow-Up Instructions: No follow-ups on file.   Orders:  Orders Placed This Encounter  Procedures  . XR Knee 1-2 Views Right   No orders of the defined types were placed in this encounter.     Procedures: Large Joint Inj: bilateral knee on 12/23/2018 9:12 AM Indications: diagnostic evaluation, joint swelling and pain Details: 18 G 1.5 in needle, superolateral approach  Arthrogram: No  Medications (Right): 5 mL lidocaine 1 %; 4 mL bupivacaine 0.25 %; 40 mg methylPREDNISolone acetate 40 MG/ML Medications (Left): 5 mL lidocaine 1 %; 4 mL bupivacaine 0.25 %; 40 mg methylPREDNISolone acetate 40 MG/ML Outcome: tolerated well, no immediate complications Procedure, treatment alternatives, risks and benefits explained, specific risks discussed. Consent was given by the patient. Immediately prior to procedure a time out was called to verify the correct patient, procedure, equipment, support staff and site/side marked as required. Patient was prepped and draped in the usual sterile fashion.       Clinical Data: No additional findings.  Objective: Vital Signs: Ht 5' 1.5" (1.562 m)   Wt 185 lb (83.9 kg)   BMI 34.39 kg/m  Physical Exam:  Constitutional: Patient appears well-developed HEENT:  Head: Normocephalic Eyes:EOM are normal Neck: Normal range of motion Cardiovascular: Normal rate Pulmonary/chest: Effort normal Neurologic: Patient is alert Skin: Skin is warm Psychiatric: Patient has normal mood and affect  Ortho Exam:  Bilateral knee Exam Positive effusion Extensor mechanism intact No TTP over the  lateral jointlines, quad tendon, patellar tendon, pes anserinus, patella, tibial tubercle, LCL/MCL insertions Moderate tenderness palpation over the  medial joint line bilaterally Stable to varus/valgus stresses.  Stable to anterior/posterior drawer Extension to 0 degrees Flexion > 90 degrees  Specialty Comments:  No specialty comments available.  Imaging: No results found.   PMFS History: Patient Active Problem List   Diagnosis Date Noted  . Pulmonary hypertension, unspecified ([place]) 07/29/2017  . Chest pain 10/07/2016  . SOB (shortness of breath) 10/07/2016  . Pseudogout of bilateral knees 03/16/2016  . Osteoarthritis 03/16/2016  . SIRS (systemic inflammatory response syndrome) ([place]) 03/14/2016  . Elevated sed rate 03/14/2016  . Primary osteoarthritis of both knees 01/01/2016  . Postoperative wound infection 12/18/2015  . Difficulty in walking, not elsewhere classified   . Status post lumbar spinal fusion   . Type 2 diabetes mellitus with diabetic polyneuropathy, without long-term current use of insulin ([place])   . 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 Angioedema   . Hyperlipidemia 10/01/2015  . Gait instability 02/19/2013  . Pulmonary hyperinflation 01/17/2013  . Diastolic dysfunction   . Essential hypertension, benign 01/03/2013   Past Medical History:  Diagnosis Date  . Allergy   . Breast cyst    left breast  . Chest pain   . Complication of anesthesia 1986   slow to awaken after cholecystectomy  . Diastolic dysfunction    with mild pulmonary HTN by echo 07/2011  . Gastric ulcer   . GERD (gastroesophageal reflux disease)   . Gout   . Hyperlipidemia   . Hypertension   . 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 Osteoarthritis   . Osteopenia   . Shortness of breath dyspnea    with activity  . Spine deformity    lower spine  . Type II diabetes mellitus ([facility])     Family History  Problem Relation Age of Onset  . Heart disease Mother   . Cancer Sister        ovarian    Past Surgical History:  Procedure Laterality Date  . ABDOMINAL ADHESION SURGERY    . ABDOMINAL HYSTERECTOMY    .  ANUS SURGERY     for torn tissue  . APPENDECTOMY    . BREAST BIOPSY    . BREAST LUMPECTOMY     left  . CHOLECYSTECTOMY  1986  . COLONOSCOPY    . ESOPHAGOGASTRODUODENOSCOPY (EGD) WITH PROPOFOL N/A 07/18/2013   Procedure: ESOPHAGOGASTRODUODENOSCOPY (EGD) WITH PROPOFOL;  Surgeon: Arta Silence, MD;  [facility];  Service: Endoscopy;  Laterality: N/A;  . EUS N/A 07/18/2013   Procedure: ESOPHAGEAL ENDOSCOPIC ULTRASOUND (EUS) RADIAL;  Surgeon: Arta Silence, MD;  [facility];  Service: Endoscopy;  Laterality: N/A;  . IR GENERIC HISTORICAL  10/30/2015   IR US GUIDE VASC ACCESS RIGHT 10/30/2015 Saverio Danker, PA-C [facility]  . IR GENERIC HISTORICAL  10/30/2015   IR FLUORO GUIDE CV LINE RIGHT 10/30/2015 Saverio Danker, PA-C [facility]  . LUMBAR WOUND DEBRIDEMENT N/A 10/01/2015   Procedure: LUMBAR WOUND DEBRIDEMENT;  Surgeon: Melina Schools, MD;  Location: [facility];  Service: Orthopedics;  Laterality: N/A;  .  LUMBAR WOUND DEBRIDEMENT N/A 10/15/2015   Procedure: [facility] OUT AND CLOSURE OF BACK WOUND;  Surgeon: Melina Schools, MD;  Location: [place];  Service: Orthopedics;  Laterality: N/A;  . MASS EXCISION Left 07/24/2015   Procedure: EXCISION MASS LEFT LONG FINGER;  Surgeon: Leanora Cover, MD;  Location: [facility];  Service: Orthopedics;  Laterality: Left;  . SPINAL FUSION N/A 09/17/2015   Procedure: FUSION POSTERIOR SPINAL MULTILEVEL L4- S1;  Surgeon: Melina Schools, MD;  Location: [facility];  Service: Orthopedics;  Laterality: N/A;  . TRANSFORAMINAL LUMBAR INTERBODY FUSION (TLIF) WITH PEDICLE SCREW FIXATION 1 LEVEL N/A 09/17/2015   Procedure: TRANSFORAMINAL LUMBAR INTERBODY FUSION (TLIF) WITH PEDICLE SCREW FIXATION 1 LEVEL , Lumbar 4-5;  Surgeon: Melina Schools, MD;  Location: [facility];  Service: Orthopedics;  Laterality: N/A;   Social History   Occupational History  . Not on file  Tobacco Use  . Smoking status: Never Smoker  . Smokeless tobacco: Never Used  Substance and  Sexual Activity  . Alcohol use: No    Comment: occasional wine  . Drug use: No  . Sexual activity: Not Currently

## 2018-12-23 ENCOUNTER — Encounter: Payer: Self-pay | Admitting: Orthopedic Surgery

## 2018-12-23 DIAGNOSIS — M170 Bilateral primary osteoarthritis of knee: Secondary | ICD-10-CM | POA: Diagnosis not present

## 2018-12-23 MED ORDER — BUPIVACAINE HCL 0.25 % IJ SOLN
4.0000 mL | INTRAMUSCULAR | Status: AC | PRN
  Administered 2018-12-23: 4 mL via INTRA_ARTICULAR

## 2018-12-23 MED ORDER — LIDOCAINE HCL 1 % IJ SOLN
5.0000 mL | INTRAMUSCULAR | Status: AC | PRN
  Administered 2018-12-23: 09:00:00 5 mL

## 2018-12-23 MED ORDER — METHYLPREDNISOLONE ACETATE 40 MG/ML IJ SUSP
40.0000 mg | INTRAMUSCULAR | Status: AC | PRN
  Administered 2018-12-23: 40 mg via INTRA_ARTICULAR

## 2018-12-23 MED ORDER — LIDOCAINE HCL 1 % IJ SOLN
5.0000 mL | INTRAMUSCULAR | Status: AC | PRN
  Administered 2018-12-23: 5 mL

## 2018-12-26 ENCOUNTER — Other Ambulatory Visit: Payer: Self-pay

## 2018-12-27 ENCOUNTER — Other Ambulatory Visit: Payer: Self-pay | Admitting: Internal Medicine

## 2018-12-27 DIAGNOSIS — E1165 Type 2 diabetes mellitus with hyperglycemia: Secondary | ICD-10-CM

## 2018-12-28 ENCOUNTER — Ambulatory Visit: Payer: Medicare Other | Admitting: Dietician

## 2018-12-28 ENCOUNTER — Ambulatory Visit: Payer: Medicare Other | Admitting: Internal Medicine

## 2019-01-01 ENCOUNTER — Encounter: Payer: Self-pay | Admitting: Internal Medicine

## 2019-01-01 ENCOUNTER — Ambulatory Visit (INDEPENDENT_AMBULATORY_CARE_PROVIDER_SITE_OTHER): Payer: Medicare Other | Admitting: Internal Medicine

## 2019-01-01 ENCOUNTER — Other Ambulatory Visit: Payer: Self-pay

## 2019-01-01 VITALS — BP 128/62 | HR 76 | Temp 98.4°F | Ht 62.0 in | Wt 190.2 lb

## 2019-01-01 DIAGNOSIS — E1165 Type 2 diabetes mellitus with hyperglycemia: Secondary | ICD-10-CM | POA: Diagnosis not present

## 2019-01-01 DIAGNOSIS — E1142 Type 2 diabetes mellitus with diabetic polyneuropathy: Secondary | ICD-10-CM | POA: Diagnosis not present

## 2019-01-01 LAB — POCT GLYCOSYLATED HEMOGLOBIN (HGB A1C): Hemoglobin A1C: 6.4 % — AB (ref 4.0–5.6)

## 2019-01-01 LAB — GLUCOSE, POCT (MANUAL RESULT ENTRY): POC Glucose: 159 mg/dl — AB (ref 70–99)

## 2019-01-01 MED ORDER — JANUVIA 100 MG PO TABS: 100.0000 mg | ORAL_TABLET | Freq: Every day | ORAL | 3 refills | Status: DC

## 2019-01-01 MED ORDER — GLIPIZIDE 5 MG PO TABS: 5.0000 mg | ORAL_TABLET | Freq: Every day | ORAL | 90 refills | Status: DC

## 2019-01-01 MED ORDER — METFORMIN HCL 1000 MG PO TABS: 1000.0000 mg | ORAL_TABLET | Freq: Two times a day (BID) | ORAL | 3 refills | Status: DC

## 2019-01-01 NOTE — Progress Notes (Signed)
`     Name: Nancy [name]  Age/ Sex: 78 y.o., female   MRN/ DOB: [id number], [birth year]     PCP: Buzzy Han, MD   Reason for Endocrinology Evaluation: Type 2 Diabetes Mellitus  Initial Endocrine Consultative Visit: 10/31/2017    PATIENT IDENTIFIER: Nancy [name] is a 78 y.o. female with a past medical history of HTN, Gout, Hyperlipidemia, and T2 DM . The patient has followed with Endocrinology clinic since 10/31/17 for consultative assistance with management of her diabetes.  DIABETIC HISTORY:  [name] was diagnosed with T2DM ~ in 2009, she has been on oral glycemic agents since her diagnosis. Has not been on insulin prior to her presentation here. Her hemoglobin A1c has ranged from 7.3% in 09/2015, peaking at 8.6% in 08/2015.   SUBJECTIVE:   During the last visit (08/30/2018): Her A1c was 6.2%. She was continued on Metformin, and Januvia and adjusted Glipizide dose    Today (01/01/2019): Ms. [name] is here for a 4 month follow up on diabetes management. She checks her blood sugars 1 time daily.The patient has not had had hypoglycemic episodes since the last clinic visit.Otherwise, the patient has not required any recent emergency interventions for hypoglycemia and has not had recent hospitalizations secondary to hyper or hypoglycemic episodes.   S/p cortisone injection to the knees on 12/20/18  She is c/o discoloration of the feet, as well as pain.   She did have chest pain from what seems like an infected sebaceous cyst  ROS: As per HPI and as detailed below: Review of Systems  Constitutional: Negative for fever and weight loss.  HENT: Negative for congestion and sore throat.   Respiratory: Negative for cough and shortness of breath.   Cardiovascular: Negative for chest pain.  Gastrointestinal: Negative for diarrhea and nausea.  Musculoskeletal: Positive for joint pain.  Skin: Positive for rash.       Discoloration of the feet       HOME  DIABETES REGIMEN:   Metfomrin 1000 mg BID  Glipizide 5 mg , 1 tablet before supper   Januvia 100 mg daily     METER DOWNLOAD SUMMARY: Did not bring        HISTORY:  Past Medical History:  Past Medical History:  Diagnosis Date  . Allergy   . Breast cyst    left breast  . Chest pain   . Complication of anesthesia 1986   slow to awaken after cholecystectomy  . Diastolic dysfunction    with mild pulmonary HTN by echo 07/2011  . Gastric ulcer   . GERD (gastroesophageal reflux disease)   . Gout   . Hyperlipidemia   . Hypertension   . 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 Osteoarthritis   . Osteopenia   . Shortness of breath dyspnea    with activity  . Spine deformity    lower spine  . Type II diabetes mellitus ([place])    Past Surgical History:  Past Surgical History:  Procedure Laterality Date  . ABDOMINAL ADHESION SURGERY    . ABDOMINAL HYSTERECTOMY    . ANUS SURGERY     for torn tissue  . APPENDECTOMY    . BREAST BIOPSY    . BREAST LUMPECTOMY     left  . CHOLECYSTECTOMY  1986  . COLONOSCOPY    . ESOPHAGOGASTRODUODENOSCOPY (EGD) WITH PROPOFOL N/A 07/18/2013   Procedure: ESOPHAGOGASTRODUODENOSCOPY (EGD) WITH PROPOFOL;  Surgeon: Arta Silence, MD;  [facility];  Service:  Endoscopy;  Laterality: N/A;  . EUS N/A 07/18/2013   Procedure: ESOPHAGEAL ENDOSCOPIC ULTRASOUND (EUS) RADIAL;  Surgeon: Arta Silence, MD;  [facility];  Service: Endoscopy;  Laterality: N/A;  . IR GENERIC HISTORICAL  10/30/2015   IR US GUIDE VASC ACCESS RIGHT 10/30/2015 Saverio Danker, PA-C [facility]  . IR GENERIC HISTORICAL  10/30/2015   IR FLUORO GUIDE CV LINE RIGHT 10/30/2015 Saverio Danker, PA-C [facility]  . LUMBAR WOUND DEBRIDEMENT N/A 10/01/2015   Procedure: LUMBAR WOUND DEBRIDEMENT;  Surgeon: Melina Schools, MD;  Location: [place] [facility];  Service: Orthopedics;  Laterality: N/A;  . LUMBAR WOUND DEBRIDEMENT N/A 10/15/2015   Procedure: [facility] OUT AND CLOSURE OF BACK  WOUND;  Surgeon: Melina Schools, MD;  Location: [facility];  Service: Orthopedics;  Laterality: N/A;  . MASS EXCISION Left 07/24/2015   Procedure: EXCISION MASS LEFT LONG FINGER;  Surgeon: Leanora Cover, MD;  Location: [facility];  Service: Orthopedics;  Laterality: Left;  . SPINAL FUSION N/A 09/17/2015   Procedure: FUSION POSTERIOR SPINAL MULTILEVEL L4- S1;  Surgeon: Melina Schools, MD;  Location: [facility];  Service: Orthopedics;  Laterality: N/A;  . TRANSFORAMINAL LUMBAR INTERBODY FUSION (TLIF) WITH PEDICLE SCREW FIXATION 1 LEVEL N/A 09/17/2015   Procedure: TRANSFORAMINAL LUMBAR INTERBODY FUSION (TLIF) WITH PEDICLE SCREW FIXATION 1 LEVEL , Lumbar 4-5;  Surgeon: Melina Schools, MD;  Location: [facility];  Service: Orthopedics;  Laterality: N/A;    Social History:  reports that she has never smoked. She has never used smokeless tobacco. She reports that she does not drink alcohol or use drugs. Family History:  Family History  Problem Relation Age of Onset  . Heart disease Mother   . Cancer Sister        ovarian     HOME MEDICATIONS: Allergies as of 01/01/2019      Reactions   Morphine And Related Swelling   10/02/15 - given Toradol + morphine and RN noted tongue swelling - given Epi + Benadryl Has had morphine in past w/o problem   Toradol [ketorolac Tromethamine] Swelling   10/02/15 - given Toradol + morphine and RN noted tongue swelling - given Epi + Benadryl Has taken ibuprofen in past w/o problem   Codeine Other (See Comments)   headache      Medication List       Accurate as of January 01, 2019  1:20 PM. If you have any questions, ask your nurse or doctor.        allopurinol 100 MG tablet Commonly known as: ZYLOPRIM Take 200 mg by mouth daily.   allopurinol 300 MG tablet Commonly known as: ZYLOPRIM Take 600 mg by mouth daily.   amLODipine 10 MG tablet Commonly known as: NORVASC Take 10 mg by mouth every morning.   aspirin EC 81 MG tablet Take 81 mg by mouth daily.     atorvastatin 10 MG tablet Commonly known as: LIPITOR Take 1 tablet (10 mg total) by mouth daily.   Colcrys 0.6 MG tablet Generic drug: colchicine Take 0.6 mg by mouth daily as needed (Gout).   diclofenac sodium 1 % Gel Commonly known as: VOLTAREN Apply 2 g topically 4 (four) times daily. Rub into affected area of foot 2 to 4 times daily   Durezol 0.05 % Emul Generic drug: Difluprednate   furosemide 20 MG tablet Commonly known as: LASIX Take 1 tablet (20 mg total) by mouth daily.   gabapentin 300 MG capsule Commonly known as: NEURONTIN Take 300 mg by mouth 3 (  three) times daily.   glipiZIDE 5 MG tablet Commonly known as: GLUCOTROL Take 2.5 mg by mouth 2 (two) times daily before a meal.   glucose blood test strip Use as instructed to test blood sugars 2 times daily E11.65   Januvia 100 MG tablet Generic drug: sitaGLIPtin TAKE 1 TABLET BY MOUTH ONCE DAILY FOR 90 DAYS   losartan 50 MG tablet Commonly known as: COZAAR Take 50 mg by mouth daily.   metFORMIN 1000 MG tablet Commonly known as: GLUCOPHAGE Take 1 tablet (1,000 mg total) by mouth 2 (two) times daily with a meal.   metoprolol succinate 100 MG 24 hr tablet Commonly known as: TOPROL-XL Take 100 mg by mouth daily. Take with or immediately following a meal.   metoprolol tartrate 100 MG tablet Commonly known as: LOPRESSOR Take once per day for blood pressure   omeprazole 20 MG capsule Commonly known as: PRILOSEC Take 20 mg by mouth daily.   OneTouch Verio IQ System w/Device Kit 1 kit by Does not apply route 2 (two) times daily. E11.65   potassium chloride SA 20 MEQ tablet Commonly known as: KLOR-CON Take 1 tablet (20 mEq total) by mouth daily.        OBJECTIVE:   Vital Signs: BP 128/62 (BP Location: Left Arm, Patient Position: Sitting, Cuff Size: Large)   Pulse 76   Temp 98.4 F (36.9 C)   Ht 5' 2"  (1.575 m)   Wt 190 lb 3.2 oz (86.3 kg)   SpO2 98%   BMI 34.79 kg/m   Wt Readings from Last 3  Encounters:  01/01/19 190 lb 3.2 oz (86.3 kg)  12/20/18 185 lb (83.9 kg)  08/30/18 193 lb 6.4 oz (87.7 kg)     Exam: General: Pt appears well and is in NAD  Lungs: Clear with good BS bilat with no rales, rhonchi, or wheezes  Heart: RRR with normal S1 and S2 and no gallops; no murmurs; no rub  Extremities: Trace pretibial edema.   Neuro: MS is good with appropriate affect, pt is alert and Ox3    DM foot exam:(01/01/19)  The skin of the feet is intact without sores or ulcerations. The pedal pulses are 2+ on right and 2+ on left. The sensation is decreased  to a screening 5.07, 10 gram monofilament on the right.         DATA REVIEWED:  Lab Results  Component Value Date   LDLCALC 55 03/14/2018   CREATININE 1.14 05/31/2018      Results for FELISA, ZECHMAN (MRN [id number]) as of 08/30/2018 11:41  Ref. Range 02/23/2018 10:51 05/31/2018 10:32 08/30/2018 10:41  Hemoglobin A1C Latest Ref Range: 4.0 - 5.6 % 6.6 (A) 7.2 (A) 6.2 (A)    ASSESSMENT / PLAN / RECOMMENDATIONS:   1) Type 2 Diabetes Mellitus, With Neuropathic complications - Most recent A1c of 6.4 %. Goal A1c < 7.0 %.     - Glucose continues to be under optimal control , I have encouraged her to continue with medication compliance.    MEDICATIONS:  Metfomrin 1000 mg BID  Glipizide 5 mg with Supper   Januvia 100 mg daily   EDUCATION / INSTRUCTIONS:  BG monitoring instructions: Patient is instructed to check her blood sugars 2 times a day, fasting and bedtime.  Call [facility] if: BG persistently < 70 or > 300. . I reviewed the Rule of 15 for the treatment of hypoglycemia in detail with the patient. Literature supplied.   2) Diabetic complications:  Eye: Does not have known diabetic retinopathy. Last eye exam was   Neuro/ Feet: Does  have known diabetic peripheral neuropathy.  Renal: Patient does  have known baseline CKD. She is on an ACEI/ARB at present.     F/U in 6  months   Signed electronically by: Mack Guise, MD  [facility]  [address] [place], [place] [address] Phone: [phone] FAX: [phone]   CC:  Buzzy Han, [place] [address] Phone: [phone]  Fax: [phone]  Return to Endocrinology clinic as below: Future Appointments  Date Time Provider [place]  01/23/2019  3:20 PM Sueanne Margarita, MD [facility]  02/01/2019  9:45 AM Jacqualyn Posey Bonna Gains, DPM [facility]

## 2019-01-01 NOTE — Patient Instructions (Signed)
 -   Glipizide 5 mg ( 1 tablet) with supper   - Continue Metformin 1000 mg Twice a day - Continue Januvia 100 mg daily    - HOW TO TREAT LOW BLOOD SUGARS (Blood sugar LESS THAN 70 MG/DL)  Please follow the RULE OF 15 for the treatment of hypoglycemia treatment (when your (blood sugars are less than 70 mg/dL)    STEP 1: Take 15 grams of carbohydrates when your blood sugar is low, which includes:   3-4 GLUCOSE TABS  OR  3-4 OZ OF JUICE OR REGULAR SODA OR  ONE TUBE OF GLUCOSE GEL     STEP 2: RECHECK blood sugar in 15 MINUTES STEP 3: If your blood sugar is still low at the 15 minute recheck --> then, go back to STEP 1 and treat AGAIN with another 15 grams of carbohydrates.

## 2019-01-23 ENCOUNTER — Encounter: Payer: Self-pay | Admitting: Cardiology

## 2019-01-23 ENCOUNTER — Other Ambulatory Visit: Payer: Self-pay

## 2019-01-23 ENCOUNTER — Ambulatory Visit (INDEPENDENT_AMBULATORY_CARE_PROVIDER_SITE_OTHER): Payer: Medicare Other | Admitting: Cardiology

## 2019-01-23 VITALS — BP 128/64 | HR 66 | Ht 62.0 in | Wt 189.2 lb

## 2019-01-23 DIAGNOSIS — G4733 Obstructive sleep apnea (adult) (pediatric): Secondary | ICD-10-CM | POA: Diagnosis not present

## 2019-01-23 DIAGNOSIS — I272 Pulmonary hypertension, unspecified: Secondary | ICD-10-CM | POA: Diagnosis not present

## 2019-01-23 DIAGNOSIS — I10 Essential (primary) hypertension: Secondary | ICD-10-CM | POA: Diagnosis not present

## 2019-01-23 DIAGNOSIS — R0602 Shortness of breath: Secondary | ICD-10-CM | POA: Diagnosis not present

## 2019-01-23 MED ORDER — METOPROLOL TARTRATE 50 MG PO TABS: 50.0000 mg | ORAL_TABLET | Freq: Two times a day (BID) | ORAL | 3 refills | Status: DC

## 2019-01-23 NOTE — Patient Instructions (Addendum)
Medication Instructions:  1)START taking metoprolol tartrate (Lopressor) 50 mg twice per day.   *If you need a refill on your cardiac medications before your next appointment, please call your pharmacy*  Lab Work: TODAY: BMET If you have labs (blood work) drawn today and your tests are completely normal, you will receive your results only by: [name] MyChart Message (if you have MyChart) OR . A paper copy in the mail If you have any lab test that is abnormal or we need to change your treatment, we will call you to review the results.  Follow-Up: At [facility], you and your health needs are our priority.  As part of our continuing mission to provide you with exceptional heart care, we have created designated Provider Care Teams.  These Care Teams include your primary Cardiologist (physician) and Advanced Practice Providers (APPs -  Physician Assistants and Nurse Practitioners) who all work together to provide you with the care you need, when you need it.  Your next appointment:   1 year(s)  The format for your next appointment:   Either In Person or Virtual  Provider:   You may see Fransico Him, MD or one of the following Advanced Practice Providers on your designated Care Team:    Melina Copa, PA-C  Ermalinda Barrios, PA-C

## 2019-01-23 NOTE — Progress Notes (Signed)
Cardiology Office Note:    Date:  01/23/2019   ID:  Nancy [name], DOB [birth year], MRN [id number]  PCP:  Buzzy Han, MD  Cardiologist:  Fransico Him, MD    Referring MD: Buzzy Han*   Chief Complaint  Patient presents with  . Hypertension  . Sleep Apnea    History of Present Illness:    Nancy [name] is a 79 y.o. female with a hx of HTN and diastolic dysfunction.She also has a history of mild pulmonary hypertension by 2D echocardiogram in July 2013.She had a Lexiscan Myoview for chest pain 10/13/2016 which showed no inducible ischemia. 2D echo in 2019 showed  normal LVF with EF 60-65% with moderate LVH and elevated filling pressures. She also has a hx of OSA and is on PAP therapy.   She is here today for followup and is doing well.  She denies any chest pain or pressure,  PND, orthopnea,  Dizziness, or syncope. She occasionally has some mild DOE but sporadic.  She has chronic LE edema that she only occasionally notices. She rarely will fell a short fluttering in her chest for a second.  She is compliant with her meds and is tolerating meds with no SE.  She is doing well with her CPAP device.  She tolerates the mask and feels the pressure is adequate.  Since going on CPAP she feels rested in the am and has no significant daytime sleepiness.  She denies any significant mouth or nasal dryness or nasal congestion.  She does not think that he snores.     Past Medical History:  Diagnosis Date  . Allergy   . Breast cyst    left breast  . Chest pain   . Complication of anesthesia 1986   slow to awaken after cholecystectomy  . Diastolic dysfunction    with mild pulmonary HTN by echo 07/2011  . Gastric ulcer   . GERD (gastroesophageal reflux disease)   . Gout   . Hyperlipidemia   . Hypertension   . 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 Osteoarthritis   . Osteopenia   . Shortness of breath dyspnea    with activity  . Spine deformity     lower spine  . Type II diabetes mellitus ([place])     Past Surgical History:  Procedure Laterality Date  . ABDOMINAL ADHESION SURGERY    . ABDOMINAL HYSTERECTOMY    . ANUS SURGERY     for torn tissue  . APPENDECTOMY    . BREAST BIOPSY    . BREAST LUMPECTOMY     left  . CHOLECYSTECTOMY  1986  . COLONOSCOPY    . ESOPHAGOGASTRODUODENOSCOPY (EGD) WITH PROPOFOL N/A 07/18/2013   Procedure: ESOPHAGOGASTRODUODENOSCOPY (EGD) WITH PROPOFOL;  Surgeon: Arta Silence, MD;  [facility];  Service: Endoscopy;  Laterality: N/A;  . EUS N/A 07/18/2013   Procedure: ESOPHAGEAL ENDOSCOPIC ULTRASOUND (EUS) RADIAL;  Surgeon: Arta Silence, MD;  [facility];  Service: Endoscopy;  Laterality: N/A;  . IR GENERIC HISTORICAL  10/30/2015   IR US GUIDE VASC ACCESS RIGHT 10/30/2015 Saverio Danker, PA-C [facility]  . IR GENERIC HISTORICAL  10/30/2015   IR FLUORO GUIDE CV LINE RIGHT 10/30/2015 Saverio Danker, PA-C [facility]  . LUMBAR WOUND DEBRIDEMENT N/A 10/01/2015   Procedure: LUMBAR WOUND DEBRIDEMENT;  Surgeon: Melina Schools, MD;  Location: [place];  Service: Orthopedics;  Laterality: N/A;  . LUMBAR WOUND DEBRIDEMENT N/A 10/15/2015   Procedure: [facility] OUT AND CLOSURE OF  BACK WOUND;  Surgeon: Melina Schools, MD;  Location: [place];  Service: Orthopedics;  Laterality: N/A;  . MASS EXCISION Left 07/24/2015   Procedure: EXCISION MASS LEFT LONG FINGER;  Surgeon: Leanora Cover, MD;  Location: [place];  Service: Orthopedics;  Laterality: Left;  . SPINAL FUSION N/A 09/17/2015   Procedure: FUSION POSTERIOR SPINAL MULTILEVEL L4- S1;  Surgeon: Melina Schools, MD;  Location: [place];  Service: Orthopedics;  Laterality: N/A;  . TRANSFORAMINAL LUMBAR INTERBODY FUSION (TLIF) WITH PEDICLE SCREW FIXATION 1 LEVEL N/A 09/17/2015   Procedure: TRANSFORAMINAL LUMBAR INTERBODY FUSION (TLIF) WITH PEDICLE SCREW FIXATION 1 LEVEL , Lumbar 4-5;  Surgeon: Melina Schools, MD;  Location: [place];  Service: Orthopedics;   Laterality: N/A;    Current Medications: Current Meds  Medication Sig  . allopurinol (ZYLOPRIM) 100 MG tablet Take 200 mg by mouth daily.   [name] allopurinol (ZYLOPRIM) 300 MG tablet Take 600 mg by mouth daily.  [name] amLODipine (NORVASC) 10 MG tablet Take 10 mg by mouth every morning.   [name] aspirin EC 81 MG tablet Take 81 mg by mouth daily.  [name] atorvastatin (LIPITOR) 10 MG tablet Take 1 tablet (10 mg total) by mouth daily.  . Blood Glucose Monitoring Suppl (ONETOUCH VERIO IQ SYSTEM) w/Device KIT 1 kit by Does not apply route 2 (two) times daily. E11.65  . colchicine (COLCRYS) 0.6 MG tablet Take 0.6 mg by mouth daily as needed (Gout).   [name] diclofenac sodium (VOLTAREN) 1 % GEL Apply 2 g topically 4 (four) times daily. Rub into affected area of foot 2 to 4 times daily  . DUREZOL 0.05 % EMUL   . furosemide (LASIX) 20 MG tablet Take 1 tablet (20 mg total) by mouth daily.  [name] gabapentin (NEURONTIN) 300 MG capsule Take 300 mg by mouth 3 (three) times daily.   [name] glipiZIDE (GLUCOTROL) 5 MG tablet Take 1 tablet (5 mg total) by mouth daily before supper.  [name] glucose blood test strip Use as instructed to test blood sugars 2 times daily E11.65  . JANUVIA 100 MG tablet Take 1 tablet (100 mg total) by mouth daily.  [name] losartan (COZAAR) 50 MG tablet Take 50 mg by mouth daily.  . metFORMIN (GLUCOPHAGE) 1000 MG tablet Take 1 tablet (1,000 mg total) by mouth 2 (two) times daily with a meal.  . metoprolol succinate (TOPROL-XL) 100 MG 24 hr tablet Take 100 mg by mouth daily. Take with or immediately following a meal.  . metoprolol tartrate (LOPRESSOR) 100 MG tablet Take once per day for blood pressure  . omeprazole (PRILOSEC) 20 MG capsule Take 20 mg by mouth daily.  . potassium chloride SA (KLOR-CON) 20 MEQ tablet Take 1 tablet (20 mEq total) by mouth daily.     Allergies:   Morphine and related, Toradol [ketorolac tromethamine], and Codeine   Social History   Socioeconomic History  . Marital status: Divorced     Spouse name: Not on file  . Number of children: 4  . Years of education: 23  . Highest education level: Not on file  Occupational History  . Not on file  Tobacco Use  . Smoking status: Never Smoker  . Smokeless tobacco: Never Used  Substance and Sexual Activity  . Alcohol use: No    Comment: occasional wine  . Drug use: No  . Sexual activity: Not Currently  Other Topics Concern  . Not on file  Social History Narrative   Patient is single, has 3 children living 1 deceased  Patient is right handed   Education level is 12   Caffeine consumption is 0   Social Determinants of Health   Financial Resource Strain:   . Difficulty of Paying Living Expenses: Not on file  Food Insecurity:   . Worried About Charity fundraiser in the Last Year: Not on file  . Ran Out of Food in the Last Year: Not on file  Transportation Needs:   . Lack of Transportation (Medical): Not on file  . Lack of Transportation (Non-Medical): Not on file  Physical Activity:   . Days of Exercise per Week: Not on file  . Minutes of Exercise per Session: Not on file  Stress:   . Feeling of Stress : Not on file  Social Connections:   . Frequency of Communication with Friends and Family: Not on file  . Frequency of Social Gatherings with Friends and Family: Not on file  . Attends Religious Services: Not on file  . Active Member of Clubs or Organizations: Not on file  . Attends Archivist Meetings: Not on file  . Marital Status: Not on file     Family History: The patient's family history includes Cancer in her sister; Heart disease in her mother.  ROS:   Please see the history of present illness.    ROS  All other systems reviewed and negative.   EKGs/Labs/Other Studies Reviewed:    The following studies were reviewed today: PAP compliance download  EKG:  EKG is  ordered today.  The ekg ordered today demonstrates NSR with low voltage  Recent Labs: 03/14/2018: ALT 25 05/31/2018: BUN 21;  Creatinine, Ser 1.14; Potassium 4.2; Sodium 138   Recent Lipid Panel    Component Value Date/Time   CHOL 119 03/14/2018 1357   TRIG 99 03/14/2018 1357   HDL 44 03/14/2018 1357   CHOLHDL 2.7 03/14/2018 1357   LDLCALC 55 03/14/2018 1357    Physical Exam:    VS:  BP 128/64   Pulse 66   Ht _0  (1.575 m)   Wt 189 lb 3.2 oz (85.8 kg)   BMI 34.61 kg/m     Wt Readings from Last 3 Encounters:  01/23/19 189 lb 3.2 oz (85.8 kg)  01/01/19 190 lb 3.2 oz (86.3 kg)  12/20/18 185 lb (83.9 kg)     GEN:  Well nourished, well developed in no acute distress HEENT: Normal NECK: No JVD; No carotid bruits LYMPHATICS: No lymphadenopathy CARDIAC: RRR, no murmurs, rubs, gallops RESPIRATORY:  Clear to auscultation without rales, wheezing or rhonchi  ABDOMEN: Soft, non-tender, non-distended MUSCULOSKELETAL:  No edema; No deformity  SKIN: Warm and dry NEUROLOGIC:  Alert and oriented x 3 PSYCHIATRIC:  Normal affect   ASSESSMENT:    1. Essential hypertension, benign   2. Pulmonary hypertension, unspecified ([place])   3. SOB (shortness of breath)   4. OSA (obstructive sleep apnea)    PLAN:    In order of problems listed above:  1.  HTN -BP controlled -continue Amlodipine 74m daily, Losartan 526mdaily  -she is taking her lopressor tartrate 100104maily instead of BID.  Her BP is good so I will change this to 31m20mD. -check BMET  2.  Pulmonary HTN -echo 2019 with no pulmonary HTN - PASP 28mm31m3. SOB -this has resolved -nuclear stress test 2018 and echo 2019 were normal  4.  OSA - The patient is tolerating PAP therapy well without any problems. The PAP download was reviewed today and  showed an AHI of 4.8/hr on 12 cm H2O with 73% compliance in using [name] than 4 hours nightly.  The patient has been using and benefiting from PAP use and will continue to benefit from therapy.   5.  PVCs  -denies any palpitations -Continue BB   Medication Adjustments/Labs and Tests Ordered: Current  medicines are reviewed at length with the patient today.  Concerns regarding medicines are outlined above.  Orders Placed This Encounter  Procedures  . EKG 12/Charge capture   No orders of the defined types were placed in this encounter.   Signed, Fransico Him, MD  01/23/2019 2:53 PM    [place]

## 2019-01-24 ENCOUNTER — Telehealth: Payer: Self-pay

## 2019-01-24 DIAGNOSIS — Z79899 Other long term (current) drug therapy: Secondary | ICD-10-CM

## 2019-01-24 LAB — BASIC METABOLIC PANEL
BUN/Creatinine Ratio: 24 (ref 12–28)
BUN: 29 mg/dL — ABNORMAL HIGH (ref 8–27)
CO2: 22 mmol/L (ref 20–29)
Calcium: 10 mg/dL (ref 8.7–10.3)
Chloride: 100 mmol/L (ref 96–106)
Creatinine, Ser: 1.22 mg/dL — ABNORMAL HIGH (ref 0.57–1.00)
GFR calc Af Amer: 50 mL/min/{1.73_m2} — ABNORMAL LOW (ref >59)
GFR calc non Af Amer: 43 mL/min/{1.73_m2} — ABNORMAL LOW (ref >59)
Glucose: 118 mg/dL — ABNORMAL HIGH (ref 65–99)
Potassium: 5.2 mmol/L (ref 3.5–5.2)
Sodium: 137 mmol/L (ref 134–144)

## 2019-01-24 MED ORDER — FUROSEMIDE 20 MG PO TABS: 20.0000 mg | ORAL_TABLET | ORAL | 3 refills | Status: DC

## 2019-01-24 NOTE — Telephone Encounter (Signed)
-----   Message from Sueanne Margarita, MD sent at 01/24/2019  3:12 PM EST ----- Change lasix to 20mg  qod with BMET in 1 week due to increased creatinine

## 2019-01-24 NOTE — Telephone Encounter (Signed)
The patient has been notified of the result and verbalized understanding.  All questions (if any) were answered. Antonieta Iba, RN 01/24/2019 4:37 PM

## 2019-01-24 NOTE — Telephone Encounter (Signed)
Spoke with patient to update her after her visit with Dr. Radford Pax yesterday that she should be taking metoprolol tartrate (Lopressor) 50 mg twice per day. Patient verbalized understanding.

## 2019-02-01 ENCOUNTER — Ambulatory Visit: Payer: Medicare Other | Admitting: Podiatry

## 2019-02-01 ENCOUNTER — Other Ambulatory Visit: Payer: Medicare Other | Admitting: *Deleted

## 2019-02-01 ENCOUNTER — Other Ambulatory Visit: Payer: Self-pay

## 2019-02-01 DIAGNOSIS — Z79899 Other long term (current) drug therapy: Secondary | ICD-10-CM

## 2019-02-02 LAB — BASIC METABOLIC PANEL
BUN/Creatinine Ratio: 21 (ref 12–28)
BUN: 23 mg/dL (ref 8–27)
CO2: 20 mmol/L (ref 20–29)
Calcium: 10.1 mg/dL (ref 8.7–10.3)
Chloride: 105 mmol/L (ref 96–106)
Creatinine, Ser: 1.12 mg/dL — ABNORMAL HIGH (ref 0.57–1.00)
GFR calc Af Amer: 55 mL/min/{1.73_m2} — ABNORMAL LOW (ref >59)
GFR calc non Af Amer: 48 mL/min/{1.73_m2} — ABNORMAL LOW (ref >59)
Glucose: 103 mg/dL — ABNORMAL HIGH (ref 65–99)
Potassium: 4.8 mmol/L (ref 3.5–5.2)
Sodium: 140 mmol/L (ref 134–144)

## 2019-02-05 ENCOUNTER — Ambulatory Visit (INDEPENDENT_AMBULATORY_CARE_PROVIDER_SITE_OTHER): Payer: Medicare Other | Admitting: Podiatry

## 2019-02-05 ENCOUNTER — Other Ambulatory Visit: Payer: Self-pay

## 2019-02-05 DIAGNOSIS — B351 Tinea unguium: Secondary | ICD-10-CM

## 2019-02-05 DIAGNOSIS — M79675 Pain in left toe(s): Secondary | ICD-10-CM

## 2019-02-05 DIAGNOSIS — M79674 Pain in right toe(s): Secondary | ICD-10-CM

## 2019-02-05 DIAGNOSIS — E1149 Type 2 diabetes mellitus with other diabetic neurological complication: Secondary | ICD-10-CM | POA: Diagnosis not present

## 2019-02-05 NOTE — Progress Notes (Signed)
Subjective: 79 y.o. returns the office today for painful, elongated, thickened toenails which she cannot trim herself. Denies any redness or drainage around the nails.  Does mention neuropathy symptoms but no significant worsening.  Denies any systemic complaints such as fevers, chills, nausea, vomiting.   A1c: 6.4  01/01/2019  Objective: AAO 3, NAD DP/PT pulses palpable, CRT less than 3 seconds Sensation decreased with SWMF.  Nails hypertrophic, dystrophic, elongated, brittle, discolored 10.  Patient presents left second toe and bilateral hallux toenails.  There is tenderness overlying the nails 1-5 bilaterally. There is no surrounding erythema or drainage along the nail sites. No open lesions or pre-ulcerative lesions are identified. No pain with calf compression, swelling, warmth, erythema.  Assessment: Patient presents with symptomatic onychomycosis; neuropathy   Plan: -Treatment options including alternatives, risks, complications were discussed -Nails sharply debrided 10 without complication/bleeding.  Debrided the symptomatic portion of ingrown toenails without complications or bleeding. -Continue gabapentin and we are going to monitor neuropathy symptoms.   Return in about 9 weeks (around 04/09/2019).  Trula Slade DPM

## 2019-04-09 ENCOUNTER — Ambulatory Visit: Payer: Medicare Other | Admitting: Podiatry

## 2019-04-16 ENCOUNTER — Ambulatory Visit: Payer: Medicare Other | Admitting: Podiatry

## 2019-05-03 ENCOUNTER — Ambulatory Visit (INDEPENDENT_AMBULATORY_CARE_PROVIDER_SITE_OTHER): Payer: Medicare Other | Admitting: Podiatry

## 2019-05-03 ENCOUNTER — Other Ambulatory Visit: Payer: Self-pay

## 2019-05-03 DIAGNOSIS — M722 Plantar fascial fibromatosis: Secondary | ICD-10-CM | POA: Diagnosis not present

## 2019-05-03 DIAGNOSIS — M79675 Pain in left toe(s): Secondary | ICD-10-CM | POA: Diagnosis not present

## 2019-05-03 DIAGNOSIS — M79674 Pain in right toe(s): Secondary | ICD-10-CM

## 2019-05-03 DIAGNOSIS — B351 Tinea unguium: Secondary | ICD-10-CM | POA: Diagnosis not present

## 2019-05-03 DIAGNOSIS — E1149 Type 2 diabetes mellitus with other diabetic neurological complication: Secondary | ICD-10-CM

## 2019-05-04 NOTE — Progress Notes (Signed)
Subjective: 79 y.o. returns the office today for painful, elongated, thickened toenails which she cannot trim herself. Denies any redness or drainage around the nails.  She does describe intermittent pain to the bottom of the right arch and heel.  No recent injury.  No swelling.  No recent treatment.  Denies any systemic complaints such as fevers, chills, nausea, vomiting.   A1c: 6.4  01/01/2019  Objective: AAO 3, NAD DP/PT pulses palpable, CRT less than 3 seconds Sensation decreased with SWMF.  Nails hypertrophic, dystrophic, elongated, brittle, discolored 10.  Patient presents left second toe and bilateral hallux toenails.  There is tenderness overlying the nails 1-5 bilaterally. There is no surrounding erythema or drainage along the nail sites. There is mild enderness to palpation along the plantar medial tubercle of the calcaneus at the insertion of plantar fascia on the right foot. There is mild pain along the course of the plantar fascia within the arch of the foot. Plantar fascia appears to be intact. There is no pain with lateral compression of the calcaneus or pain with vibratory sensation. There is no pain along the course or insertion of the achilles tendon. No other areas of tenderness to bilateral lower extremities. No open lesions or pre-ulcerative lesions are identified. No pain with calf compression, swelling, warmth, erythema.  Assessment: Patient presents with symptomatic onychomycosis; neuropathy; plantar fasciitis   Plan: -Treatment options including alternatives, risks, complications were discussed -Nails sharply debrided 10 without complication/bleeding.   -Continue gabapentin and we are going to monitor neuropathy symptoms.  -In regards the plantar pressures were discussed stretching, icing exercises daily.  Discussed wearing supportive shoes.  Muscle up on steroid injection.  Patient is to continue x-rays appointment.  Return in about 9 weeks   Trula Slade  DPM

## 2019-05-14 ENCOUNTER — Other Ambulatory Visit: Payer: Self-pay | Admitting: Physician Assistant

## 2019-05-15 ENCOUNTER — Other Ambulatory Visit: Payer: Self-pay | Admitting: Physician Assistant

## 2019-05-15 DIAGNOSIS — R1013 Epigastric pain: Secondary | ICD-10-CM

## 2019-05-21 ENCOUNTER — Ambulatory Visit
Admission: RE | Admit: 2019-05-21 | Discharge: 2019-05-21 | Disposition: A | Discharge status: Home / Self Care | Payer: Medicare Other | Source: Ambulatory Visit | Attending: Physician Assistant | Admitting: Physician Assistant

## 2019-05-21 DIAGNOSIS — R1013 Epigastric pain: Secondary | ICD-10-CM

## 2019-06-18 ENCOUNTER — Ambulatory Visit: Payer: Self-pay

## 2019-06-18 ENCOUNTER — Other Ambulatory Visit: Payer: Self-pay

## 2019-06-18 ENCOUNTER — Ambulatory Visit (INDEPENDENT_AMBULATORY_CARE_PROVIDER_SITE_OTHER): Payer: Medicare Other | Admitting: Orthopedic Surgery

## 2019-06-18 VITALS — Ht 62.0 in | Wt 188.0 lb

## 2019-06-18 DIAGNOSIS — M170 Bilateral primary osteoarthritis of knee: Secondary | ICD-10-CM | POA: Diagnosis not present

## 2019-06-18 DIAGNOSIS — M25561 Pain in right knee: Secondary | ICD-10-CM

## 2019-06-18 DIAGNOSIS — M25562 Pain in left knee: Secondary | ICD-10-CM | POA: Diagnosis not present

## 2019-06-21 ENCOUNTER — Encounter: Payer: Self-pay | Admitting: Orthopedic Surgery

## 2019-06-21 DIAGNOSIS — M170 Bilateral primary osteoarthritis of knee: Secondary | ICD-10-CM | POA: Diagnosis not present

## 2019-06-21 DIAGNOSIS — M25561 Pain in right knee: Secondary | ICD-10-CM

## 2019-06-21 DIAGNOSIS — M25562 Pain in left knee: Secondary | ICD-10-CM | POA: Diagnosis not present

## 2019-06-21 MED ORDER — BUPIVACAINE HCL 0.25 % IJ SOLN
4.0000 mL | INTRAMUSCULAR | Status: AC | PRN
  Administered 2019-06-21: 4 mL via INTRA_ARTICULAR

## 2019-06-21 MED ORDER — METHYLPREDNISOLONE ACETATE 40 MG/ML IJ SUSP
30.0000 mg | INTRAMUSCULAR | Status: AC | PRN
  Administered 2019-06-21: 30 mg via INTRA_ARTICULAR

## 2019-06-21 MED ORDER — LIDOCAINE HCL 1 % IJ SOLN
5.0000 mL | INTRAMUSCULAR | Status: AC | PRN
  Administered 2019-06-21: 5 mL

## 2019-06-21 NOTE — Progress Notes (Signed)
Office Visit Note   Patient: Nancy [name]           Date of Birth: 1941-01-07           MRN: [id number] Visit Date: 06/18/2019 Requested by: Buzzy Han, MD [place],  [place] [id number] PCP: Buzzy Han, MD  Subjective: Chief Complaint  Patient presents with  . Right Knee - Pain  . Left Knee - Pain    HPI: Nancy [name] is a 79 year old patient with bilateral knee pain chronic left greater than right.  She reports no swelling or locking but does report stiffness with pain which occasionally wakes her from sleep.  She does ambulate with a cane.  Does not take any medication for her knees.  She has had reasonable success with knee injections in the remote past.              ROS: All systems reviewed are negative as they relate to the chief complaint within the history of present illness.  Patient denies  fevers or chills.   Assessment & Plan: Visit Diagnoses:  1. Right knee pain, unspecified chronicity   2. Primary osteoarthritis of both knees     Plan: Impression is bilateral knee arthritis with some loss of motion and significant reluctance to consider knee replacement surgery.  Plan at this time is repeat cortisone injection.  She will follow her blood glucose carefully.  Continue with nonweightbearing quad strengthening exercises and weight loss as able.  Follow-up with [place] as needed.  Follow-Up Instructions: Return if symptoms worsen or fail to improve.   Orders:  Orders Placed This Encounter  Procedures  . XR Knee 1-2 Views Left  . XR KNEE 3 VIEW RIGHT   No orders of the defined types were placed in this encounter.     Procedures: Large Joint Inj: bilateral knee on 06/21/2019 11:33 PM Indications: diagnostic evaluation, joint swelling and pain Details: 18 G 1.5 in needle, superolateral approach  Arthrogram: No  Medications (Right): 5 mL lidocaine 1 %; 4 mL bupivacaine 0.25 %; 30 mg methylPREDNISolone acetate 40  MG/ML Medications (Left): 5 mL lidocaine 1 %; 4 mL bupivacaine 0.25 %; 30 mg methylPREDNISolone acetate 40 MG/ML Outcome: tolerated well, no immediate complications Procedure, treatment alternatives, risks and benefits explained, specific risks discussed. Consent was given by the patient. Immediately prior to procedure a time out was called to verify the correct patient, procedure, equipment, support staff and site/side marked as required. Patient was prepped and draped in the usual sterile fashion.       Clinical Data: No additional findings.  Objective: Vital Signs: Ht 5\' 2"  (1.575 m)   Wt 188 lb (85.3 kg)   BMI 34.39 kg/m   Physical Exam:   Constitutional: Patient appears well-developed HEENT:  Head: Normocephalic Eyes:EOM are normal Neck: Normal range of motion Cardiovascular: Normal rate Pulmonary/chest: Effort normal Neurologic: Patient is alert Skin: Skin is warm Psychiatric: Patient has normal mood and affect    Ortho Exam: Ortho exam demonstrates full active and passive range of motion of the ankles and hips.  She is got about 10 degree flexion contracture bilaterally with intact extensor mechanism on both knees and stable collateral cruciate ligaments.  Pedal pulses palpable.  Flexion not really much past 90 in both knees.  Specialty Comments:  No specialty comments available.  Imaging: No results found.   PMFS History: Patient Active Problem List   Diagnosis Date Noted  . Pulmonary hypertension, unspecified ([place]) 07/29/2017  . Chest  pain 10/07/2016  . SOB (shortness of breath) 10/07/2016  . Pseudogout of bilateral knees 03/16/2016  . Osteoarthritis 03/16/2016  . SIRS (systemic inflammatory response syndrome) ([place]) 03/14/2016  . Elevated sed rate 03/14/2016  . Primary osteoarthritis of both knees 01/01/2016  . Postoperative wound infection 12/18/2015  . Difficulty in walking, not elsewhere classified   . Status post lumbar spinal fusion   . Type 2  diabetes mellitus with diabetic polyneuropathy, without long-term current use of insulin ([place])   . 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 Angioedema   . Hyperlipidemia 10/01/2015  . Gait instability 02/19/2013  . Pulmonary hyperinflation 01/17/2013  . Diastolic dysfunction   . Essential hypertension, benign 01/03/2013   Past Medical History:  Diagnosis Date  . Allergy   . Breast cyst    left breast  . Chest pain   . Complication of anesthesia 1986   slow to awaken after cholecystectomy  . Diastolic dysfunction    with mild pulmonary HTN by echo 07/2011  . Gastric ulcer   . GERD (gastroesophageal reflux disease)   . Gout   . Hyperlipidemia   . Hypertension   . 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 Osteoarthritis   . Osteopenia   . Shortness of breath dyspnea    with activity  . Spine deformity    lower spine  . Type II diabetes mellitus ([facility])     Family History  Problem Relation Age of Onset  . Heart disease Mother   . Cancer Sister        ovarian    Past Surgical History:  Procedure Laterality Date  . ABDOMINAL ADHESION SURGERY    . ABDOMINAL HYSTERECTOMY    . ANUS SURGERY     for torn tissue  . APPENDECTOMY    . BREAST BIOPSY    . BREAST LUMPECTOMY     left  . CHOLECYSTECTOMY  1986  . COLONOSCOPY    . ESOPHAGOGASTRODUODENOSCOPY (EGD) WITH PROPOFOL N/A 07/18/2013   Procedure: ESOPHAGOGASTRODUODENOSCOPY (EGD) WITH PROPOFOL;  Surgeon: Arta Silence, MD;  [facility];  Service: Endoscopy;  Laterality: N/A;  . EUS N/A 07/18/2013   Procedure: ESOPHAGEAL ENDOSCOPIC ULTRASOUND (EUS) RADIAL;  Surgeon: Arta Silence, MD;  [facility];  Service: Endoscopy;  Laterality: N/A;  . IR GENERIC HISTORICAL  10/30/2015   IR US GUIDE VASC ACCESS RIGHT 10/30/2015 Saverio Danker, PA-C [facility]  . IR GENERIC HISTORICAL  10/30/2015   IR FLUORO GUIDE CV LINE RIGHT 10/30/2015 Saverio Danker, PA-C [facility]  . LUMBAR WOUND DEBRIDEMENT N/A  10/01/2015   Procedure: LUMBAR WOUND DEBRIDEMENT;  Surgeon: Melina Schools, MD;  Location: [place];  Service: Orthopedics;  Laterality: N/A;  . LUMBAR WOUND DEBRIDEMENT N/A 10/15/2015   Procedure: [facility] OUT AND CLOSURE OF BACK WOUND;  Surgeon: Melina Schools, MD;  Location: [place];  Service: Orthopedics;  Laterality: N/A;  . MASS EXCISION Left 07/24/2015   Procedure: EXCISION MASS LEFT LONG FINGER;  Surgeon: Leanora Cover, MD;  Location: [place];  Service: Orthopedics;  Laterality: Left;  . SPINAL FUSION N/A 09/17/2015   Procedure: FUSION POSTERIOR SPINAL MULTILEVEL L4- S1;  Surgeon: Melina Schools, MD;  Location: [place];  Service: Orthopedics;  Laterality: N/A;  . TRANSFORAMINAL LUMBAR INTERBODY FUSION (TLIF) WITH PEDICLE SCREW FIXATION 1 LEVEL N/A 09/17/2015   Procedure: TRANSFORAMINAL LUMBAR INTERBODY FUSION (TLIF) WITH PEDICLE SCREW FIXATION 1 LEVEL , Lumbar 4-5;  Surgeon: Melina Schools, MD;  Location: [facility];  Service: Orthopedics;  Laterality: N/A;   Social History   Occupational History  . Not on file  Tobacco Use  . Smoking status: Never Smoker  . Smokeless tobacco: Never Used  Vaping Use  . Vaping Use: Never used  Substance and Sexual Activity  . Alcohol use: No    Comment: occasional wine  . Drug use: No  . Sexual activity: Not Currently

## 2019-06-28 ENCOUNTER — Other Ambulatory Visit: Payer: Self-pay | Admitting: Cardiology

## 2019-07-02 ENCOUNTER — Ambulatory Visit: Payer: Medicare Other | Admitting: Internal Medicine

## 2019-07-05 ENCOUNTER — Other Ambulatory Visit: Payer: Self-pay

## 2019-07-05 ENCOUNTER — Ambulatory Visit (INDEPENDENT_AMBULATORY_CARE_PROVIDER_SITE_OTHER): Payer: Medicare Other | Admitting: Podiatry

## 2019-07-05 DIAGNOSIS — B351 Tinea unguium: Secondary | ICD-10-CM

## 2019-07-05 DIAGNOSIS — M79675 Pain in left toe(s): Secondary | ICD-10-CM

## 2019-07-05 DIAGNOSIS — E1149 Type 2 diabetes mellitus with other diabetic neurological complication: Secondary | ICD-10-CM

## 2019-07-05 DIAGNOSIS — M79674 Pain in right toe(s): Secondary | ICD-10-CM

## 2019-07-06 ENCOUNTER — Encounter: Payer: Self-pay | Admitting: Internal Medicine

## 2019-07-06 ENCOUNTER — Ambulatory Visit (INDEPENDENT_AMBULATORY_CARE_PROVIDER_SITE_OTHER): Payer: Medicare Other | Admitting: Internal Medicine

## 2019-07-06 VITALS — BP 128/64 | HR 62 | Ht 62.0 in | Wt 186.6 lb

## 2019-07-06 DIAGNOSIS — E1142 Type 2 diabetes mellitus with diabetic polyneuropathy: Secondary | ICD-10-CM

## 2019-07-06 LAB — POCT GLYCOSYLATED HEMOGLOBIN (HGB A1C): Hemoglobin A1C: 6 % — AB (ref 4.0–5.6)

## 2019-07-06 MED ORDER — JANUVIA 100 MG PO TABS: 100.0000 mg | ORAL_TABLET | Freq: Every day | ORAL | 3 refills | Status: DC

## 2019-07-06 MED ORDER — METFORMIN HCL 1000 MG PO TABS: 1000.0000 mg | ORAL_TABLET | Freq: Two times a day (BID) | ORAL | 3 refills | Status: DC

## 2019-07-06 NOTE — Progress Notes (Signed)
`     Name: Nancy [name]  Age/ Sex: 79 y.o., female   MRN/ DOB: [id number], [birth year]     PCP: Buzzy Han, MD   Reason for Endocrinology Evaluation: Type 2 Diabetes Mellitus  Initial Endocrine Consultative Visit: 10/31/2017    PATIENT IDENTIFIER: Nancy [name] is a 79 y.o. female with a past medical history of HTN, Gout, Hyperlipidemia, and T2 DM . The patient has followed with Endocrinology clinic since 10/31/17 for consultative assistance with management of her diabetes.  DIABETIC HISTORY:  Nancy [name] was diagnosed with T2DM ~ in 2009, she has been on oral glycemic agents since her diagnosis. Has not been on insulin prior to her presentation here. Her hemoglobin A1c has ranged from 7.3% in 09/2015, peaking at 8.6% in 08/2015.   SUBJECTIVE:   During the last visit (01/01/2019): Her A1c was 6.4 %. She was continued on Metformin, Januvia and  Glipizide .    Today (07/09/2019): Nancy [name] is here for a 6 month follow up on diabetes management. She checks her blood sugars 1 time daily.The patient has not had had hypoglycemic episodes since the last clinic visit.Otherwise, the patient has not required any recent emergency interventions for hypoglycemia and has not had recent hospitalizations secondary to hyper or hypoglycemic episodes.    HOME DIABETES REGIMEN:   Metfomrin 1000 mg BID- has been taking 1x a day   Glipizide 5 mg , 1 tablet before supper - doesn't always take it   Januvia 100 mg daily    METER DOWNLOAD SUMMARY: 6/12- 07/06/2019 Fingerstick Blood Glucose Tests = 6 Average Number Tests/Day = 145 Overall Mean FS Glucose = 0.4   BG Ranges: Low = 117 High = 171     Hypoglycemic Events/30 Days: BG < 50 = 0 Episodes of symptomatic severe hypoglycemia = 0         HISTORY:  Past Medical History:  Past Medical History:  Diagnosis Date  . Allergy   . Breast cyst    left breast  . Chest pain   . Complication of anesthesia 1986    slow to awaken after cholecystectomy  . Diastolic dysfunction    with mild pulmonary HTN by echo 07/2011  . Gastric ulcer   . GERD (gastroesophageal reflux disease)   . Gout   . Hyperlipidemia   . Hypertension   . 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 Osteoarthritis   . Osteopenia   . Shortness of breath dyspnea    with activity  . Spine deformity    lower spine  . Type II diabetes mellitus ([place])    Past Surgical History:  Past Surgical History:  Procedure Laterality Date  . ABDOMINAL ADHESION SURGERY    . ABDOMINAL HYSTERECTOMY    . ANUS SURGERY     for torn tissue  . APPENDECTOMY    . BREAST BIOPSY    . BREAST LUMPECTOMY     left  . CHOLECYSTECTOMY  1986  . COLONOSCOPY    . ESOPHAGOGASTRODUODENOSCOPY (EGD) WITH PROPOFOL N/A 07/18/2013   Procedure: ESOPHAGOGASTRODUODENOSCOPY (EGD) WITH PROPOFOL;  Surgeon: Arta Silence, MD;  Location: [facility];  Service: Endoscopy;  Laterality: N/A;  . EUS N/A 07/18/2013   Procedure: ESOPHAGEAL ENDOSCOPIC ULTRASOUND (EUS) RADIAL;  Surgeon: Arta Silence, MD;  Location: [facility];  Service: Endoscopy;  Laterality: N/A;  . IR GENERIC HISTORICAL  10/30/2015   IR US GUIDE VASC ACCESS RIGHT 10/30/2015 Saverio Danker, PA-C [facility]  . IR  GENERIC HISTORICAL  10/30/2015   IR FLUORO GUIDE CV LINE RIGHT 10/30/2015 Saverio Danker, PA-C [facility]  . LUMBAR WOUND DEBRIDEMENT N/A 10/01/2015   Procedure: LUMBAR WOUND DEBRIDEMENT;  Surgeon: Melina Schools, MD;  Location: [place];  Service: Orthopedics;  Laterality: N/A;  . LUMBAR WOUND DEBRIDEMENT N/A 10/15/2015   Procedure: [facility] OUT AND CLOSURE OF BACK WOUND;  Surgeon: Melina Schools, MD;  Location: [place];  Service: Orthopedics;  Laterality: N/A;  . MASS EXCISION Left 07/24/2015   Procedure: EXCISION MASS LEFT LONG FINGER;  Surgeon: Leanora Cover, MD;  Location: [place];  Service: Orthopedics;  Laterality: Left;  . SPINAL FUSION N/A 09/17/2015   Procedure: FUSION POSTERIOR  SPINAL MULTILEVEL L4- S1;  Surgeon: Melina Schools, MD;  Location: [place];  Service: Orthopedics;  Laterality: N/A;  . TRANSFORAMINAL LUMBAR INTERBODY FUSION (TLIF) WITH PEDICLE SCREW FIXATION 1 LEVEL N/A 09/17/2015   Procedure: TRANSFORAMINAL LUMBAR INTERBODY FUSION (TLIF) WITH PEDICLE SCREW FIXATION 1 LEVEL , Lumbar 4-5;  Surgeon: Melina Schools, MD;  Location: [place];  Service: Orthopedics;  Laterality: N/A;    Social History:  reports that she has never smoked. She has never used smokeless tobacco. She reports that she does not drink alcohol and does not use drugs. Family History:  Family History  Problem Relation Age of Onset  . Heart disease Mother   . Cancer Sister        ovarian     HOME MEDICATIONS: Allergies as of 07/06/2019      Reactions   Morphine And Related Swelling   10/02/15 - given Toradol + morphine and RN noted tongue swelling - given Epi + Benadryl Has had morphine in past w/o problem   Toradol [ketorolac Tromethamine] Swelling   10/02/15 - given Toradol + morphine and RN noted tongue swelling - given Epi + Benadryl Has taken ibuprofen in past w/o problem   Codeine Other (See Comments)   headache      Medication List       Accurate as of July 06, 2019 11:59 PM. If you have any questions, ask your nurse or doctor.        STOP taking these medications   glipiZIDE 5 MG tablet Commonly known as: GLUCOTROL Stopped by: Dorita Sciara, MD     TAKE these medications   allopurinol 100 MG tablet Commonly known as: ZYLOPRIM Take 200 mg by mouth daily.   allopurinol 300 MG tablet Commonly known as: ZYLOPRIM Take 600 mg by mouth daily.   amLODipine 10 MG tablet Commonly known as: NORVASC Take 10 mg by mouth every morning.   aspirin EC 81 MG tablet Take 81 mg by mouth daily.   atorvastatin 10 MG tablet Commonly known as: LIPITOR Take 1 tablet by mouth once daily   Colcrys 0.6 MG tablet Generic drug: colchicine Take 0.6 mg by mouth daily as needed  (Gout).   cyclobenzaprine 10 MG tablet Commonly known as: FLEXERIL Take 10-20 mg by mouth 3 (three) times daily as needed.   diclofenac sodium 1 % Gel Commonly known as: VOLTAREN Apply 2 g topically 4 (four) times daily. Rub into affected area of foot 2 to 4 times daily   Durezol 0.05 % Emul Generic drug: Difluprednate   furosemide 20 MG tablet Commonly known as: LASIX Take 1 tablet (20 mg total) by mouth every other day.   gabapentin 300 MG capsule Commonly known as: NEURONTIN Take 300 mg by mouth 3 (three) times daily.   glucose  blood test strip Use as instructed to test blood sugars 2 times daily E11.65   Januvia 100 MG tablet Generic drug: sitaGLIPtin Take 1 tablet (100 mg total) by mouth daily.   losartan 50 MG tablet Commonly known as: COZAAR Take 50 mg by mouth daily.   metFORMIN 1000 MG tablet Commonly known as: GLUCOPHAGE Take 1 tablet (1,000 mg total) by mouth 2 (two) times daily with a meal. What changed: when to take this   metoprolol tartrate 50 MG tablet Commonly known as: LOPRESSOR Take 1 tablet (50 mg total) by mouth 2 (two) times daily.   omeprazole 20 MG capsule Commonly known as: PRILOSEC Take 20 mg by mouth daily.   OneTouch Verio IQ System w/Device Kit 1 kit by Does not apply route 2 (two) times daily. E11.65   potassium chloride SA 20 MEQ tablet Commonly known as: KLOR-CON Take 1 tablet (20 mEq total) by mouth daily.        OBJECTIVE:   Vital Signs: BP 128/64 (BP Location: Left Arm, Patient Position: Sitting, Cuff Size: Large)   Pulse 62   Ht 5' 2"  (1.575 m)   Wt 186 lb 9.6 oz (84.6 kg)   SpO2 98%   BMI 34.13 kg/m   Wt Readings from Last 3 Encounters:  07/06/19 186 lb 9.6 oz (84.6 kg)  06/18/19 188 lb (85.3 kg)  01/23/19 189 lb 3.2 oz (85.8 kg)     Exam: General: Pt appears well and is in NAD  Lungs: Clear with good BS bilat with no rales, rhonchi, or wheezes  Heart: RRR with normal S1 and S2 and no gallops; no murmurs;  no rub  Extremities: Trace pretibial edema.   Neuro: MS is good with appropriate affect, pt is alert and Ox3    DM foot exam:(01/01/19)  The skin of the feet is intact without sores or ulcerations. The pedal pulses are 2+ on right and 2+ on left. The sensation is decreased  to a screening 5.07, 10 gram monofilament on the right.         DATA REVIEWED:  Lab Results  Component Value Date   LDLCALC 55 03/14/2018   CREATININE 1.12 (H) 02/01/2019     Results for TALLEY, CASCO (MRN [id number]) as of 07/09/2019 07:14  Ref. Range 07/06/2019 14:49  Microalbumin, Urine Latest Ref Range: Not Estab. ug/mL 17.3  MICROALB/CREAT RATIO Latest Ref Range: 0 - 29 mg/g creat 22  Creatinine, Urine Latest Ref Range: Not Estab. mg/dL 79.3    ASSESSMENT / PLAN / RECOMMENDATIONS:   1) Type 2 Diabetes Mellitus, With Neuropathic complications - Most recent A1c of 6.0 %. Goal A1c < 7.0 %.     - Pt with an A1c 6.0%. There's no evidence of hypoglycemia at this time . Pt takes Glipizide sporadically, she also reduced metformin from 2x to 1x a day for no specific reasons, she denies GI side effects.  - We discussed importance of consistency with taking medications, I am going to stop Glipizide and restart metformin at BID dosing at this time.    MEDICATIONS: - Stop Glipizide  - Restart Metformin 1000 mg Twice a day - Continue Januvia 100 mg daily      EDUCATION / INSTRUCTIONS:  BG monitoring instructions: Patient is instructed to check her blood sugars 1 times a day, fasting   Call [facility] if: BG persistently < 70 or > 300. . I reviewed the Rule of 15 for the treatment of hypoglycemia in detail with  the patient. Literature supplied.   2) Diabetic complications:   Eye: Does not have known diabetic retinopathy. Last eye exam was   Neuro/ Feet: Does  have known diabetic peripheral neuropathy.  Renal: Patient does  have known baseline CKD. She is on an ACEI/ARB at  present. Normal Ma/Cr ratio      F/U in 6 months   Signed electronically by: Mack Guise, MD  [facility] End[facility]  [facility] [address] [place], [place] [address] Phone: [phone] FAX: [phone]   CC:  Buzzy Han, [place] [address] Phone: [phone]  Fax: [phone]  Return to Endocrinology clinic as below: Future Appointments  Date Time Provider [place]  10/08/2019  1:15 PM Trula Slade, DPM [facility]  12/28/2019  1:20 PM Shenouda Genova, Melanie Crazier, MD [facility] None

## 2019-07-06 NOTE — Patient Instructions (Addendum)
 -   Stop Glipizide  - Restart Metformin 1000 mg Twice a day - Continue Januvia 100 mg daily    - HOW TO TREAT LOW BLOOD SUGARS (Blood sugar LESS THAN 70 MG/DL)  Please follow the RULE OF 15 for the treatment of hypoglycemia treatment (when your (blood sugars are less than 70 mg/dL)    STEP 1: Take 15 grams of carbohydrates when your blood sugar is low, which includes:   3-4 GLUCOSE TABS  OR  3-4 OZ OF JUICE OR REGULAR SODA OR  ONE TUBE OF GLUCOSE GEL     STEP 2: RECHECK blood sugar in 15 MINUTES STEP 3: If your blood sugar is still low at the 15 minute recheck --> then, go back to STEP 1 and treat AGAIN with another 15 grams of carbohydrates.

## 2019-07-07 LAB — MICROALBUMIN / CREATININE URINE RATIO
Creatinine, Urine: 79.3 mg/dL
Microalb/Creat Ratio: 22 mg/g creat (ref 0–29)
Microalbumin, Urine: 17.3 ug/mL

## 2019-07-09 NOTE — Progress Notes (Signed)
Subjective: 79 y.o. returns the office today for painful, elongated, thickened toenails which she cannot trim herself. Denies any redness or drainage around the nails.  She does describe intermittent pain to the bottom of the right arch and heel still but not on a regular basis.  No swelling or redness.  No recent injury.  No recent injury.  No swelling.  She is on gabapentin which does help the neuropathy.  Denies any systemic complaints such as fevers, chills, nausea, vomiting.   Objective: AAO 3, NAD DP/PT pulses palpable, CRT less than 3 seconds Sensation decreased with SWMF.  Nails hypertrophic, dystrophic, elongated, brittle, discolored 10. There is tenderness overlying the nails 1-5 bilaterally. There is no surrounding erythema or drainage along the nail sites. There is currently no tenderness to palpation along the plantar medial tubercle of the calcaneus at the insertion of plantar fascia on the right foot. There is no pain along the course of the plantar fascia within the arch of the foot. Plantar fascia appears to be intact. There is no pain with lateral compression of the calcaneus or pain with vibratory sensation. There is no pain along the course or insertion of the achilles tendon. No other areas of tenderness to bilateral lower extremities. No open lesions or pre-ulcerative lesions are identified. No pain with calf compression, swelling, warmth, erythema.  Assessment: Patient presents with symptomatic onychomycosis; neuropathy; plantar fasciitis   Plan: -Treatment options including alternatives, risks, complications were discussed -Nails sharply debrided 10 without complication/bleeding.   -Continue gabapentin and we are going to monitor neuropathy symptoms.  -Discussed steroid injection.  Currently no discomfort still-x-rays again today.  Continue stretching, icing daily as well as wearing good supportive shoes, inserts.  Return in about 9 weeks   Trula Slade  DPM

## 2019-07-11 ENCOUNTER — Other Ambulatory Visit: Payer: Self-pay | Admitting: Cardiology

## 2019-08-16 ENCOUNTER — Telehealth: Payer: Self-pay | Admitting: Cardiology

## 2019-08-16 NOTE — Telephone Encounter (Signed)
Spoke with the pot and she reports that she has been having increased SOB with rest and with exertion for the oast several days. She denies peripheral edema but says she had right sided chest pain yesterday at rest and every time she got up from the couch it bothered her but only lasted a few minutes each time, she does not have nitro at home.   She denies cough, pain with movement and no pain with breathing in and out.. she says she has not had any pain today and feeling a little better but still wants to be seen to be sure nothing more is happening with her heart.   I made her an appt with Dr. Radford Pax for Monday 08/20/19 but urged her to go to the ED over the weekend if her pain returns, changes or worsen... pt verbalized understanding and agreed.

## 2019-08-16 NOTE — Telephone Encounter (Signed)
Pt c/o of Chest Pain: STAT if CP now or developed within 24 hours  1. Are you having CP right now? yes  2. Are you experiencing any other symptoms (ex. SOB, nausea, vomiting, sweating)? SOB  3. How long have you been experiencing CP? yesterday  4. Is your CP continuous or coming and going? Comes and goes  5. Have you taken Nitroglycerin? No   Patient states she has been having chest discomfort since yesterday. She states it gets worse when she moves around and gets SOB and a high HR. She states she also gets a pain on her right side.  ?

## 2019-08-20 ENCOUNTER — Encounter: Payer: Self-pay | Admitting: Cardiology

## 2019-08-20 ENCOUNTER — Ambulatory Visit (INDEPENDENT_AMBULATORY_CARE_PROVIDER_SITE_OTHER): Payer: Medicare Other | Admitting: Cardiology

## 2019-08-20 ENCOUNTER — Other Ambulatory Visit: Payer: Self-pay

## 2019-08-20 VITALS — BP 142/64 | HR 91 | Ht 62.0 in | Wt 187.6 lb

## 2019-08-20 DIAGNOSIS — G4733 Obstructive sleep apnea (adult) (pediatric): Secondary | ICD-10-CM

## 2019-08-20 DIAGNOSIS — I10 Essential (primary) hypertension: Secondary | ICD-10-CM

## 2019-08-20 DIAGNOSIS — I493 Ventricular premature depolarization: Secondary | ICD-10-CM

## 2019-08-20 DIAGNOSIS — R0602 Shortness of breath: Secondary | ICD-10-CM

## 2019-08-20 DIAGNOSIS — R072 Precordial pain: Secondary | ICD-10-CM | POA: Diagnosis not present

## 2019-08-20 DIAGNOSIS — I272 Pulmonary hypertension, unspecified: Secondary | ICD-10-CM | POA: Diagnosis not present

## 2019-08-20 MED ORDER — METOPROLOL TARTRATE 100 MG PO TABS: 100.0000 mg | ORAL_TABLET | Freq: Once | ORAL | 0 refills | Status: DC

## 2019-08-20 NOTE — Patient Instructions (Addendum)
Medication Instructions:  Your physician recommends that you continue on your current medications as directed. Please refer to the Current Medication list given to you today.  *If you need a refill on your cardiac medications before your next appointment, please call your pharmacy*  Lab Work: TODAY: BMET, CBC, TSH, BNP If you have labs (blood work) drawn today and your tests are completely normal, you will receive your results only by:  [place] (if you have MyChart) OR  A paper copy in the mail If you have any lab test that is abnormal or we need to change your treatment, we will call you to review the results.   Testing/Procedures: Your physician has recommended that you wear an event monitor. Event monitors are medical devices that record the hearts electrical activity. Doctors most often [place] these monitors to diagnose arrhythmias. Arrhythmias are problems with the speed or rhythm of the heartbeat. The monitor is a small, portable device. You can wear one while you do your normal daily activities. This is usually used to diagnose what is causing palpitations/syncope (passing out).  Your physician has recommended that you have a Coronary CTA Scan done. Please see below for further instructions.    Follow-Up: At [facility], you and your health needs are our priority.  As part of our continuing mission to provide you with exceptional heart care, we have created designated Provider Care Teams.  These Care Teams include your primary Cardiologist (physician) and Advanced Practice Providers (APPs -  Physician Assistants and Nurse Practitioners) who all work together to provide you with the care you need, when you need it.  Your next appointment:   6 month(s)  The format for your next appointment:   In Person  Provider:   You may see Fransico Him, MD or one of the following Advanced Practice Providers on your designated Care Team:    Melina Copa, PA-C  Ermalinda Barrios,  PA-C   Other Instructions: Your cardiac CT will be scheduled at one of the below locations:   [facility] [address] [place], [place] [address] [phone]   Please arrive at the [facility] main entrance of [facility] 30 minutes prior to test start time. Proceed to the [facility] Radiology Department (first floor) to check-in and test prep.  Please follow these instructions carefully (unless otherwise directed):  On the Night Before the Test:  Be sure to Drink plenty of water.  Do not consume any caffeinated/decaffeinated beverages or chocolate 12 hours prior to your test.  Do not take any antihistamines 12 hours prior to your test.  On the Day of the Test:  Drink plenty of water. Do not drink any water within one hour of the test.  Do not eat any food 4 hours prior to the test.  You may take your regular medications prior to the test.   Take metoprolol (Lopressor) two hours prior to test.  HOLD Furosemide morning of the test.  FEMALES- please wear underwire-free bra if available       After the Test:  Drink plenty of water.  After receiving IV contrast, you may experience a mild flushed feeling. This is normal.  On occasion, you may experience a mild rash up to 24 hours after the test. This is not dangerous. If this occurs, you can take Benadryl 25 mg and increase your fluid intake.  If you experience trouble breathing, this can be serious. If it is severe call 911 IMMEDIATELY. If it  is mild, please call our office.  If you take any of these medications: Glipizide/Metformin, Avandament, Glucavance, please do not take 48 hours after completing test unless otherwise instructed.   Once we have confirmed authorization from your insurance company, we will call you to set up a date and time for your test. Based on how quickly your insurance processes prior authorizations requests, please allow up to 4 weeks to be contacted for scheduling  your Cardiac CT appointment. Be advised that routine Cardiac CT appointments could be scheduled as many as 8 weeks after your provider has ordered it.  For non-scheduling related questions, please contact the cardiac imaging nurse navigator should you have any questions/concerns: Marchia Bond, Cardiac Imaging Nurse Navigator Burley Saver, Interim Cardiac Imaging Nurse [place] [facility] Direct Office Dial: [phone]   For scheduling needs, including cancellations and rescheduling, please call Vivien Rota at [phone], option 3.

## 2019-08-20 NOTE — Progress Notes (Signed)
Cardiology Office Note:    Date:  08/22/2019   ID:  Nancy [name], DOB [birth year], MRN [id number]  PCP:  Buzzy Han, MD  Cardiologist:  Fransico Him, MD    Referring MD: Buzzy Han*   Chief Complaint  Patient presents with  . Shortness of Breath  . Hypertension    History of Present Illness:    Nancy [name] is a 79 y.o. female with a hx of HTN and diastolic dysfunction.She also has a history of mild pulmonary hypertension by 2D echocardiogram in July 2013.She had a Lexiscan Myoview for chest pain 10/13/2016 which showed no inducible ischemia. 2D echo in 2019 showed  normal LVF with EF 60-65% with moderate LVH and elevated filling pressures. She also has a hx of OSA and is on PAP therapy.   She is here today for followup and is doing well.  She denies any  PND, orthopnea,  Dizziness, or syncope. She occasionally has some mild DOE but sporadic except for last week.  She tells me that last Thursday she had DOE that was very bad and occurred whenever she exerted herself.  She also has been having occasional pain that she cannot tell if it is in her breast or in her chest and will get a pins and needles sensation under her right armpit.  She also has been having discomfort in her stomach and is not sure if pain is related to GERD. She recently had an EGD done and showed bad reflux.   She has chronic LE edema that she only occasionally notices.  She is compliant with her meds and is tolerating meds with no SE.    She is doing well with her CPAP device.  She tolerates the mask and feels the pressure is adequate.  Since going on CPAP she feels rested in the am and has no significant daytime sleepiness.  She denies any significant mouth or nasal dryness or nasal congestion.  She does not think that he snores.     Past Medical History:  Diagnosis Date  . Allergy   . Breast cyst    left breast  . Chest pain   . Complication of anesthesia 1986   slow to  awaken after cholecystectomy  . Diastolic dysfunction    with mild pulmonary HTN by echo 07/2011  . Gastric ulcer   . GERD (gastroesophageal reflux disease)   . Gout   . Hyperlipidemia   . Hypertension   . 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 Osteoarthritis   . Osteopenia   . Shortness of breath dyspnea    with activity  . Spine deformity    lower spine  . Type II diabetes mellitus ([place])     Past Surgical History:  Procedure Laterality Date  . ABDOMINAL ADHESION SURGERY    . ABDOMINAL HYSTERECTOMY    . ANUS SURGERY     for torn tissue  . APPENDECTOMY    . BREAST BIOPSY    . BREAST LUMPECTOMY     left  . CHOLECYSTECTOMY  1986  . COLONOSCOPY    . ESOPHAGOGASTRODUODENOSCOPY (EGD) WITH PROPOFOL N/A 07/18/2013   Procedure: ESOPHAGOGASTRODUODENOSCOPY (EGD) WITH PROPOFOL;  Surgeon: Arta Silence, MD;  Location: [facility];  Service: Endoscopy;  Laterality: N/A;  . EUS N/A 07/18/2013   Procedure: ESOPHAGEAL ENDOSCOPIC ULTRASOUND (EUS) RADIAL;  Surgeon: Arta Silence, MD;  Location: [facility];  Service: Endoscopy;  Laterality: N/A;  . IR GENERIC HISTORICAL  10/30/2015   IR  US GUIDE VASC ACCESS RIGHT 10/30/2015 Saverio Danker, PA-C [facility]  . IR GENERIC HISTORICAL  10/30/2015   IR FLUORO GUIDE CV LINE RIGHT 10/30/2015 Saverio Danker, PA-C [facility]  . LUMBAR WOUND DEBRIDEMENT N/A 10/01/2015   Procedure: LUMBAR WOUND DEBRIDEMENT;  Surgeon: Melina Schools, MD;  Location: [place];  Service: Orthopedics;  Laterality: N/A;  . LUMBAR WOUND DEBRIDEMENT N/A 10/15/2015   Procedure: [facility] OUT AND CLOSURE OF BACK WOUND;  Surgeon: Melina Schools, MD;  Location: [place];  Service: Orthopedics;  Laterality: N/A;  . MASS EXCISION Left 07/24/2015   Procedure: EXCISION MASS LEFT LONG FINGER;  Surgeon: Leanora Cover, MD;  Location: [place];  Service: Orthopedics;  Laterality: Left;  . SPINAL FUSION N/A 09/17/2015   Procedure: FUSION POSTERIOR SPINAL MULTILEVEL L4- S1;  Surgeon:  Melina Schools, MD;  Location: [place];  Service: Orthopedics;  Laterality: N/A;  . TRANSFORAMINAL LUMBAR INTERBODY FUSION (TLIF) WITH PEDICLE SCREW FIXATION 1 LEVEL N/A 09/17/2015   Procedure: TRANSFORAMINAL LUMBAR INTERBODY FUSION (TLIF) WITH PEDICLE SCREW FIXATION 1 LEVEL , Lumbar 4-5;  Surgeon: Melina Schools, MD;  Location: [place];  Service: Orthopedics;  Laterality: N/A;    Current Medications: No outpatient medications have been marked as taking for the 08/20/19 encounter (Office Visit) with Sueanne Margarita, MD.     Allergies:   Morphine and related, Toradol [ketorolac tromethamine], and Codeine   Social History   Socioeconomic History  . Marital status: Divorced    Spouse name: Not on file  . Number of children: 4  . Years of education: 6  . Highest education level: Not on file  Occupational History  . Not on file  Tobacco Use  . Smoking status: Never Smoker  . Smokeless tobacco: Never Used  Vaping Use  . Vaping Use: Never used  Substance and Sexual Activity  . Alcohol use: No    Comment: occasional wine  . Drug use: No  . Sexual activity: Not Currently  Other Topics Concern  . Not on file  Social History Narrative   Patient is single, has 3 children living 1 deceased   Patient is right handed   Education level is 12   Caffeine consumption is 0   Social Determinants of Health   Financial Resource Strain:   . Difficulty of Paying Living Expenses:   Food Insecurity:   . Worried About Charity fundraiser in the Last Year:   . Arboriculturist in the Last Year:   Transportation Needs:   . Film/video editor (Medical):   [name] Lack of Transportation (Non-Medical):   Physical Activity:   . Days of Exercise per Week:   . Minutes of Exercise per Session:   Stress:   . Feeling of Stress :   Social Connections:   . Frequency of Communication with Friends and Family:   . Frequency of Social Gatherings with Friends and Family:   . Attends Religious Services:   . Active  Member of Clubs or Organizations:   . Attends Archivist Meetings:   [name] Marital Status:      Family History: The patient's family history includes Cancer in her sister; Heart disease in her mother.  ROS:   Please see the history of present illness.    ROS  All other systems reviewed and negative.   EKGs/Labs/Other Studies Reviewed:    The following studies were reviewed today: PAP compliance download  EKG:  EKG is  ordered today.  The ekg ordered today demonstrates NSR with low voltage  Recent Labs: 08/20/2019: BUN 20; Creatinine, Ser 1.23; Hemoglobin 10.7; NT-Pro BNP 189; Platelets 194; Potassium 4.0; Sodium 143; TSH 2.270   Recent Lipid Panel    Component Value Date/Time   CHOL 119 03/14/2018 1357   TRIG 99 03/14/2018 1357   HDL 44 03/14/2018 1357   CHOLHDL 2.7 03/14/2018 1357   LDLCALC 55 03/14/2018 1357    Physical Exam:    VS:  BP (!) 142/64   Pulse 91   Ht 5\' 2"  (1.575 m)   Wt 187 lb 9.6 oz (85.1 kg)   SpO2 97%   BMI 34.31 kg/m     Wt Readings from Last 3 Encounters:  08/20/19 187 lb 9.6 oz (85.1 kg)  07/06/19 186 lb 9.6 oz (84.6 kg)  06/18/19 188 lb (85.3 kg)     GEN:  Well nourished, well developed in no acute distress HEENT: Normal NECK: No JVD; No carotid bruits LYMPHATICS: No lymphadenopathy CARDIAC: RRR, no murmurs, rubs, gallops RESPIRATORY:  Clear to auscultation without rales, wheezing or rhonchi  ABDOMEN: Soft, non-tender, non-distended MUSCULOSKELETAL:  No edema; No deformity  SKIN: Warm and dry NEUROLOGIC:  Alert and oriented x 3 PSYCHIATRIC:  Normal affect   ASSESSMENT:    1. Essential hypertension, benign   2. Pulmonary hypertension, unspecified ([place])   3. SOB (shortness of breath)   4. Precordial pain   5. OSA (obstructive sleep apnea)   6. PVC's (premature ventricular contractions)    PLAN:    In order of problems listed above:  1.  HTN -BP controlled -continue Amlodipine 10mg  daily, Losartan 50mg  daily  -check  BMET  2.  Pulmonary HTN -echo 2019 with no pulmonary HTN - PASP 61mmHg  3. SOB/chest pain -now having [name] SOB -nuclear stress test 2018 and echo 2019 were normal -I have recommended a coronary CTA to assess for ischemia  4.  OSA - The patient is tolerating PAP therapy well without any problems. The PAP download was reviewed today and showed an AHI of 4.8/hr on 12 cm H2O with 73% compliance in using [name] than 4 hours nightly.  The patient has been using and benefiting from PAP use and will continue to benefit from therapy.   5.  PVCs  -denies any palpitations -Continue BB   Medication Adjustments/Labs and Tests Ordered: Current medicines are reviewed at length with the patient today.  Concerns regarding medicines are outlined above.  Orders Placed This Encounter  Procedures  . CT CORONARY MORPH W/CTA COR W/SCORE W/CA W/CM &/OR WO/CM  . CT CORONARY FRACTIONAL FLOW RESERVE DATA PREP  . CT CORONARY FRACTIONAL FLOW RESERVE FLUID ANALYSIS  . Pro b natriuretic peptide (BNP)  . Basic metabolic panel  . CBC  . TSH  . Specimen status report  . CARDIAC EVENT MONITOR   Meds ordered this encounter  Medications  . metoprolol tartrate (LOPRESSOR) 100 MG tablet    Sig: Take 1 tablet (100 mg total) by mouth once for 1 dose. Take 1 tablet (100 mg) two hours prior to CT scan    Dispense:  1 tablet    Refill:  0    Signed, Fransico Him, MD  08/22/2019 8:38 AM    [facility]

## 2019-08-21 LAB — CBC
Hematocrit: 32.5 % — ABNORMAL LOW (ref 34.0–46.6)
Hemoglobin: 10.7 g/dL — ABNORMAL LOW (ref 11.1–15.9)
MCH: 28.5 pg (ref 26.6–33.0)
MCHC: 32.9 g/dL (ref 31.5–35.7)
MCV: 86 fL (ref 79–97)
Platelets: 194 10*3/uL (ref 150–450)
RBC: 3.76 x10E6/uL — ABNORMAL LOW (ref 3.77–5.28)
RDW: 14.3 % (ref 11.7–15.4)
WBC: 6.4 10*3/uL (ref 3.4–10.8)

## 2019-08-21 LAB — BASIC METABOLIC PANEL
BUN/Creatinine Ratio: 16 (ref 12–28)
BUN: 20 mg/dL (ref 8–27)
CO2: 22 mmol/L (ref 20–29)
Calcium: 10.4 mg/dL — ABNORMAL HIGH (ref 8.7–10.3)
Chloride: 102 mmol/L (ref 96–106)
Creatinine, Ser: 1.23 mg/dL — ABNORMAL HIGH (ref 0.57–1.00)
GFR calc Af Amer: 49 mL/min/{1.73_m2} — ABNORMAL LOW (ref >59)
GFR calc non Af Amer: 42 mL/min/{1.73_m2} — ABNORMAL LOW (ref >59)
Glucose: 110 mg/dL — ABNORMAL HIGH (ref 65–99)
Potassium: 4 mmol/L (ref 3.5–5.2)
Sodium: 143 mmol/L (ref 134–144)

## 2019-08-21 LAB — PRO B NATRIURETIC PEPTIDE: NT-Pro BNP: 189 pg/mL (ref 0–738)

## 2019-08-21 LAB — SPECIMEN STATUS REPORT

## 2019-08-21 LAB — TSH: TSH: 2.27 u[IU]/mL (ref 0.450–4.500)

## 2019-08-27 ENCOUNTER — Encounter (INDEPENDENT_AMBULATORY_CARE_PROVIDER_SITE_OTHER): Payer: Medicare Other

## 2019-08-27 DIAGNOSIS — R072 Precordial pain: Secondary | ICD-10-CM

## 2019-08-27 DIAGNOSIS — R0602 Shortness of breath: Secondary | ICD-10-CM

## 2019-08-27 DIAGNOSIS — I493 Ventricular premature depolarization: Secondary | ICD-10-CM | POA: Diagnosis not present

## 2019-09-10 ENCOUNTER — Other Ambulatory Visit: Payer: Self-pay | Admitting: Cardiology

## 2019-09-10 ENCOUNTER — Ambulatory Visit: Payer: Medicare Other | Admitting: Internal Medicine

## 2019-09-11 ENCOUNTER — Telehealth (HOSPITAL_COMMUNITY): Payer: Self-pay | Admitting: Emergency Medicine

## 2019-09-11 NOTE — Telephone Encounter (Signed)
Reaching out to patient to offer assistance regarding upcoming cardiac imaging study; pt verbalizes understanding of appt date/time, parking situation and where to check in, pre-test NPO status and medications ordered, and verified current allergies; name and call back number provided for further questions should they arise Tatyanna Cronk RN Navigator Cardiac Imaging  Heart and Vascular [phone] office [phone] cell 

## 2019-09-12 MED ORDER — POTASSIUM CHLORIDE CRYS ER 20 MEQ PO TBCR: 20.0000 meq | EXTENDED_RELEASE_TABLET | Freq: Every day | ORAL | 3 refills | Status: DC

## 2019-09-13 ENCOUNTER — Telehealth: Payer: Self-pay | Admitting: Cardiology

## 2019-09-13 ENCOUNTER — Other Ambulatory Visit: Payer: Self-pay

## 2019-09-13 ENCOUNTER — Ambulatory Visit (HOSPITAL_COMMUNITY)
Admission: RE | Admit: 2019-09-13 | Discharge: 2019-09-13 | Disposition: A | Discharge status: Home / Self Care | Payer: Medicare Other | Source: Ambulatory Visit | Attending: Cardiology | Admitting: Cardiology

## 2019-09-13 ENCOUNTER — Encounter: Payer: Self-pay | Admitting: Cardiology

## 2019-09-13 DIAGNOSIS — R072 Precordial pain: Secondary | ICD-10-CM | POA: Insufficient documentation

## 2019-09-13 DIAGNOSIS — I2510 Atherosclerotic heart disease of native coronary artery without angina pectoris: Secondary | ICD-10-CM | POA: Insufficient documentation

## 2019-09-13 MED ORDER — METOPROLOL TARTRATE 5 MG/5ML IV SOLN
INTRAVENOUS | Status: AC
  Administered 2019-09-13: 5 mg
  Filled 2019-09-13: qty 10

## 2019-09-13 MED ORDER — NITROGLYCERIN 0.4 MG SL SUBL: 0.8000 mg | SUBLINGUAL_TABLET | Freq: Once | SUBLINGUAL | Status: AC

## 2019-09-13 MED ORDER — NITROGLYCERIN 0.4 MG SL SUBL
SUBLINGUAL_TABLET | SUBLINGUAL | Status: AC
  Administered 2019-09-13: 0.8 mg via SUBLINGUAL
  Filled 2019-09-13: qty 2

## 2019-09-13 MED ORDER — IOHEXOL 350 MG/ML SOLN
80.0000 mL | Freq: Once | INTRAVENOUS | Status: AC | PRN
  Administered 2019-09-13: 80 mL via INTRAVENOUS

## 2019-09-13 NOTE — Telephone Encounter (Signed)
Patient had allergic reaction to [employer] monitor strips.  Offered to contact [employer] and have a sensitive skin monitor set up shipped to her home.  Patient preferred to discontinue monitor service at this time.  She will ship the monitor back to [employer] tomorrow.

## 2019-09-13 NOTE — Telephone Encounter (Signed)
Nancy [name] is calling stating her heart monitor battery went low and she had to take it off in order to change it. She states there is a blister where the strip of the device was placed and it has irritated her skin, itching very badly. She states she has worn her monitor for about 2 weeks and was supposed to have it on until the 09/14. She removed the device around 3:00 AM this morning and has not worn it since. She is wanting to know if she can go ahead and send it back in because she is no longer wanting to wear it. Please advise.

## 2019-09-18 ENCOUNTER — Other Ambulatory Visit: Payer: Self-pay | Admitting: Cardiology

## 2019-09-18 DIAGNOSIS — R072 Precordial pain: Secondary | ICD-10-CM

## 2019-09-18 DIAGNOSIS — R0602 Shortness of breath: Secondary | ICD-10-CM

## 2019-09-18 DIAGNOSIS — I493 Ventricular premature depolarization: Secondary | ICD-10-CM

## 2019-10-08 ENCOUNTER — Ambulatory Visit: Payer: Medicare Other | Admitting: Podiatry

## 2019-10-09 ENCOUNTER — Telehealth: Payer: Self-pay | Admitting: Cardiology

## 2019-10-09 MED ORDER — METOPROLOL TARTRATE 50 MG PO TABS: 50.0000 mg | ORAL_TABLET | Freq: Two times a day (BID) | ORAL | 3 refills | Status: DC

## 2019-10-09 NOTE — Telephone Encounter (Signed)
Not feeling good. Starting last night around ~11 pm feeling heart palpitations "fluttering in her chest". Got lightheaded and dizzy around ~10 am; lasted just a second and went away. Blood pressure 108/62 at doctors appointment today.  Current BP: 125/54        HR:  67  Patient currently taking metoprolol tartrate 50 mg twice daily.  Normally takes ~1pm and second dose ~12am.  She has not taken it today; she was waiting until she got back home from her doctors appointment.   Advised patient to take medication now and more consistently every 12 hours to have the full benefit.   Will forward to Dr. Radford Pax and RN.    Advised patient to call back if symptoms worsen. Patient verbalized understanding.

## 2019-10-09 NOTE — Telephone Encounter (Signed)
Have her decrease amlodipine to 2.5mg  daily and check BP and HR daily for a week and call with resultrs

## 2019-10-09 NOTE — Telephone Encounter (Signed)
Continue Lopressor 50mg  Bid and decrease amlodipine to 5mg  daily.  Check BP and HR daily for a week and call with results.  Recommend that she purchase the [facility] device online to monitor her palpitations

## 2019-10-09 NOTE — Telephone Encounter (Signed)
I spoke with patient and gave her information from Dr Radford Pax.  She reports she is currently taking half of a 10 mg amlodipine tablet daily.

## 2019-10-09 NOTE — Telephone Encounter (Signed)
Please document how much lopressor she is taking

## 2019-10-09 NOTE — Telephone Encounter (Signed)
Patient c/o Palpitations:  High priority if patient c/o lightheadedness, shortness of breath, or chest pain  1) How long have you had palpitations/irregular HR/ Afib? Are you having the symptoms now? Last night; yes  2) Are you currently experiencing lightheadedness, SOB or CP? lightheadedness  3) Do you have a history of afib (atrial fibrillation) or irregular heart rhythm? No  4) Have you checked your BP or HR? (document readings if available): 108/62  5) Are you experiencing any other symptoms? Lightheadedness, weakness, tiredness

## 2019-10-09 NOTE — Telephone Encounter (Signed)
Left message we will call back tomorrow 9/29 with Dr. Theodosia Blender recommendations.

## 2019-10-10 MED ORDER — AMLODIPINE BESYLATE 2.5 MG PO TABS: 2.5000 mg | ORAL_TABLET | Freq: Every day | ORAL | 3 refills | Status: DC

## 2019-10-10 NOTE — Telephone Encounter (Signed)
Spoke with the patient and she will decrease amlodipine 2.5 mg daily and keep up with BP/HR for one week daily and call us with results.

## 2019-10-16 ENCOUNTER — Other Ambulatory Visit: Payer: Self-pay

## 2019-10-16 ENCOUNTER — Ambulatory Visit (INDEPENDENT_AMBULATORY_CARE_PROVIDER_SITE_OTHER): Payer: Medicare Other | Admitting: Podiatry

## 2019-10-16 DIAGNOSIS — B351 Tinea unguium: Secondary | ICD-10-CM

## 2019-10-16 DIAGNOSIS — M722 Plantar fascial fibromatosis: Secondary | ICD-10-CM | POA: Diagnosis not present

## 2019-10-16 DIAGNOSIS — G629 Polyneuropathy, unspecified: Secondary | ICD-10-CM | POA: Diagnosis not present

## 2019-10-16 DIAGNOSIS — M79675 Pain in left toe(s): Secondary | ICD-10-CM

## 2019-10-16 DIAGNOSIS — M79674 Pain in right toe(s): Secondary | ICD-10-CM | POA: Diagnosis not present

## 2019-10-16 DIAGNOSIS — E1149 Type 2 diabetes mellitus with other diabetic neurological complication: Secondary | ICD-10-CM | POA: Diagnosis not present

## 2019-10-18 NOTE — Telephone Encounter (Signed)
Spoke with the patient who states that she was originally taking amlodipine 10 mg daily but she has reduced it to 5 mg daily. She reports the following BP/HRs: 09/29: 125/61, 72 09/30: 111/58, 68 10/01: 126/57, 67 10/02: 116/53, 72 10/03: 136/65, 75 10/04: 123/60, 63 10/05: 132/62, 70 Patient reports that she is still feeling fatigued but dizziness has improved.  Received office visit note from patient's rheumatologist and per Dr. Radford Pax I have scheduled the patient for FU with Melina Copa, PA-C 11/01.

## 2019-10-18 NOTE — Progress Notes (Signed)
Subjective: 79 y.o. returns the office today for painful, elongated, thickened toenails which she cannot trim herself. Denies any redness or drainage around the nails.  She still has some foot pain and describes more of neuropathy symptoms. She is on gabapentin but she does not take it as directed. She is not taking it as much as prescribed. Denies any systemic complaints such as fevers, chills, nausea, vomiting.   Objective: AAO 3, NAD DP/PT pulses palpable, CRT less than 3 seconds Sensation decreased with SWMF.  Nails hypertrophic, dystrophic, elongated, brittle, discolored 10. There is tenderness overlying the nails 1-5 bilaterally. There is no surrounding erythema or drainage along the nail sites. There is no area pinpoint tenderness.  There is to be increased edema, erythema. No open lesions or pre-ulcerative lesions are identified. No pain with calf compression, swelling, warmth, erythema.  Assessment: Patient presents with symptomatic onychomycosis; neuropath  Plan: -Treatment options including alternatives, risks, complications were discussed -Nails sharply debrided 10 without complication/bleeding.   -We discussed changing medications for neuropathy however she is not taking gabapentin as prescribed.  I like to see how she does with taking it as directed.  If no improvement consider neurology evaluation versus change in medication. -No current plan fasciitis.  Continue with stretching, supportive shoes.  Return in about 9 weeks   Trula Slade DPM

## 2019-11-09 ENCOUNTER — Encounter: Payer: Self-pay | Admitting: Physician Assistant

## 2019-11-09 NOTE — Progress Notes (Signed)
Cardiology Office Note    Date:  11/12/2019   ID:  Nancy [name], DOB 04/24/1940, MRN [id number]  PCP:  Buzzy Han, MD  Cardiologist:  Fransico Him, MD  Electrophysiologist:  None   Chief Complaint: f/u dizziness  History of Present Illness:   Nancy [name] is a 79 y.o. female with history of hypertension, diastolic dysfunction, sleep apnea on CPAP, gastric ulcer, GERD, hyperlipidemia, neuropathy from diabetes, osteoarthritis, PVCs, previous mild pulmonary HTN, spine deformity, chronic appearing anemia/chronic kidney disease stage 3, mild coronary atherosclerosis, and event monitor findings below.   She had remote stress testing that was normal.  There was previous report of mild pulmonary hypertension on echocardiogram 2013 but this was resolved on last echo in 2019 which also showed EF 78-67%, diastolic dysfunction, mildly dilated LA. She was seen by Dr. Radford Pax in August 2021 for shortness of breath and atypical chest pain.  Coronary CT was performed showing minimal coronary atherosclerosis.  An event monitor was also ordered at that visit which showed sinus bradycardia, sinus rhythm, and sinus tachycardia with average heart rate 71 bpm, range 58-103, and 1 episode nonsustained atrial tachycardia up to 5 beats in a row.  There were 2 episodes of sinus pauses up to 3.5 seconds.  Once occurred during sleep at 30 bpm and the other occurred in the evening around 8:37 with heart rate 30 bpm.  She was not symptomatic at the time.  There have since been phone notes outlining dizziness prompting her to decrease her amlodipine from 64m to 555mdaily with improvement in symptoms. She reports having numerous palpitations during her monitor - the trigger she did indicate correlated with NSR.  She is seen back for follow-up today overall doing well. She does report continued palpitations about 2x/week, very fleeting, not associated with any other symptoms. She had one episode of  dizziness about 3 weeks ago while getting into bed lasting a few seconds with spontaneous resolution. No pre-syncope, syncope or angina. She continues to note chronic dyspnea on exertion which is persistent but unchanged for the past several months. This, along with chronic back/knee pain, limit her functional capacity.   Labwork independently reviewed: 08/2019 TSH wnl, Hgb 10.7, Cr 1.2, BNP wnl (10.7 Hgb in 01/2019) 2020 LDL 55   Past Medical History:  Diagnosis Date  . Allergy   . Anemia   . Breast cyst    left breast  . CAD (coronary artery disease), native coronary artery    minimal ASCAD to the distal LM and mid LAD with 0-24% stenosis and Calcium score of 26.  . Chest pain   . CKD (chronic kidney disease), stage III [place]  . Complication of anesthesia 1986   slow to awaken after cholecystectomy  . Diastolic dysfunction    with mild pulmonary HTN by echo 07/2011  . Gastric ulcer   . GERD (gastroesophageal reflux disease)   . Gout   . Hyperlipidemia   . Hypertension   . 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 Osteoarthritis   . Osteopenia   . Shortness of breath dyspnea    with activity  . Sinus pause   . Spine deformity    lower spine  . Type II diabetes mellitus ([place]    Past Surgical History:  Procedure Laterality Date  . ABDOMINAL ADHESION SURGERY    . ABDOMINAL HYSTERECTOMY    . ANUS SURGERY     for torn tissue  . APPENDECTOMY    .  BREAST BIOPSY    . BREAST LUMPECTOMY     left  . CHOLECYSTECTOMY  1986  . COLONOSCOPY    . ESOPHAGOGASTRODUODENOSCOPY (EGD) WITH PROPOFOL N/A 07/18/2013   Procedure: ESOPHAGOGASTRODUODENOSCOPY (EGD) WITH PROPOFOL;  Surgeon: Arta Silence, MD;  Location: [facility];  Service: Endoscopy;  Laterality: N/A;  . EUS N/A 07/18/2013   Procedure: ESOPHAGEAL ENDOSCOPIC ULTRASOUND (EUS) RADIAL;  Surgeon: Arta Silence, MD;  Location: [facility];  Service: Endoscopy;  Laterality: N/A;  . IR GENERIC HISTORICAL  10/30/2015   IR US  GUIDE VASC ACCESS RIGHT 10/30/2015 Saverio Danker, PA-C [facility]  . IR GENERIC HISTORICAL  10/30/2015   IR FLUORO GUIDE CV LINE RIGHT 10/30/2015 Saverio Danker, PA-C [facility]  . LUMBAR WOUND DEBRIDEMENT N/A 10/01/2015   Procedure: LUMBAR WOUND DEBRIDEMENT;  Surgeon: Melina Schools, MD;  Location: [place];  Service: Orthopedics;  Laterality: N/A;  . LUMBAR WOUND DEBRIDEMENT N/A 10/15/2015   Procedure: [facility] OUT AND CLOSURE OF BACK WOUND;  Surgeon: Melina Schools, MD;  Location: [place];  Service: Orthopedics;  Laterality: N/A;  . MASS EXCISION Left 07/24/2015   Procedure: EXCISION MASS LEFT LONG FINGER;  Surgeon: Leanora Cover, MD;  Location: [place];  Service: Orthopedics;  Laterality: Left;  . SPINAL FUSION N/A 09/17/2015   Procedure: FUSION POSTERIOR SPINAL MULTILEVEL L4- S1;  Surgeon: Melina Schools, MD;  Location: [place];  Service: Orthopedics;  Laterality: N/A;  . TRANSFORAMINAL LUMBAR INTERBODY FUSION (TLIF) WITH PEDICLE SCREW FIXATION 1 LEVEL N/A 09/17/2015   Procedure: TRANSFORAMINAL LUMBAR INTERBODY FUSION (TLIF) WITH PEDICLE SCREW FIXATION 1 LEVEL , Lumbar 4-5;  Surgeon: Melina Schools, MD;  Location: [place];  Service: Orthopedics;  Laterality: N/A;    Current Medications: Current Meds  Medication Sig  . allopurinol (ZYLOPRIM) 300 MG tablet Take 300 mg by mouth daily.   [name] amLODipine (NORVASC) 5 MG tablet Take 5 mg by mouth daily.  [name] aspirin EC 81 MG tablet Take 81 mg by mouth daily.  [name] atorvastatin (LIPITOR) 10 MG tablet Take 1 tablet by mouth once daily  . Blood Glucose Monitoring Suppl (ONETOUCH VERIO IQ SYSTEM) w/Device KIT 1 kit by Does not apply route 2 (two) times daily. E11.65  . colchicine (COLCRYS) 0.6 MG tablet Take 0.6 mg by mouth daily as needed (Gout).   . cyclobenzaprine (FLEXERIL) 10 MG tablet Take 10-20 mg by mouth 3 (three) times daily as needed.  . DUREZOL 0.05 % EMUL 1-2 drops as directed. For dry eyes  . furosemide (LASIX) 20 MG tablet Take 1 tablet (20  mg total) by mouth every other day.  . gabapentin (NEURONTIN) 300 MG capsule Take 300 mg by mouth 3 (three) times daily.   [name] glucose blood test strip Use as instructed to test blood sugars 2 times daily E11.65  . JANUVIA 100 MG tablet Take 1 tablet (100 mg total) by mouth daily.  [name] losartan (COZAAR) 50 MG tablet Take 50 mg by mouth daily.  . metFORMIN (GLUCOPHAGE) 1000 MG tablet Take 1 tablet (1,000 mg total) by mouth 2 (two) times daily with a meal.  . metoprolol tartrate (LOPRESSOR) 50 MG tablet Take 1 tablet (50 mg total) by mouth 2 (two) times daily.  . Multiple Vitamins tablet Take 1 tablet by mouth daily.  [name] omeprazole (PRILOSEC) 20 MG capsule Take 20 mg by mouth as needed.   . potassium chloride SA (KLOR-CON) 20 MEQ tablet Take 1 tablet (20 mEq total) by mouth daily.  . vitamin B-12 (CYANOCOBALAMIN)  1000 MCG tablet Take 1,000 mcg by mouth daily.      Allergies:   Morphine and related, Toradol [ketorolac tromethamine], and Codeine   Social History   Socioeconomic History  . Marital status: Divorced    Spouse name: Not on file  . Number of children: 4  . Years of education: 43  . Highest education level: Not on file  Occupational History  . Not on file  Tobacco Use  . Smoking status: Never Smoker  . Smokeless tobacco: Never Used  Vaping Use  . Vaping Use: Never used  Substance and Sexual Activity  . Alcohol use: No    Comment: occasional wine  . Drug use: No  . Sexual activity: Not Currently  Other Topics Concern  . Not on file  Social History Narrative   Patient is single, has 3 children living 1 deceased   Patient is right handed   Education level is 12   Caffeine consumption is 0   Social Determinants of Health   Financial Resource Strain:   . Difficulty of Paying Living Expenses: Not on file  Food Insecurity:   . Worried About Charity fundraiser in the Last Year: Not on file  . Ran Out of Food in the Last Year: Not on file  Transportation Needs:   . Lack  of Transportation (Medical): Not on file  . Lack of Transportation (Non-Medical): Not on file  Physical Activity:   . Days of Exercise per Week: Not on file  . Minutes of Exercise per Session: Not on file  Stress:   . Feeling of Stress : Not on file  Social Connections:   . Frequency of Communication with Friends and Family: Not on file  . Frequency of Social Gatherings with Friends and Family: Not on file  . Attends Religious Services: Not on file  . Active Member of Clubs or Organizations: Not on file  . Attends Archivist Meetings: Not on file  . Marital Status: Not on file     Family History:  The patient's family history includes Cancer in her sister; Heart disease in her mother.  ROS:   Please see the history of present illness. No bleeding All other systems are reviewed and otherwise negative.    EKGs/Labs/Other Studies Reviewed:    Studies reviewed are outlined and summarized above. Reports included below if pertinent.  2D echo 2019 - Left ventricle: The cavity size was normal. Wall thickness was  increased in a pattern of moderate LVH. Systolic function was  normal. The estimated ejection fraction was in the range of 60%  to 65%. Doppler parameters are consistent with both elevated  ventricular end-diastolic filling pressure and elevated left  atrial filling pressure.  - Mitral valve: Calcified annulus. Mildly thickened leaflets .  - Left atrium: The atrium was mildly dilated.  - Atrial septum: No defect or patent foramen ovale was identified.   Cor CT 09/2019 EXAM: Cardiac/Coronary  CT  TECHNIQUE: The patient was scanned on a [employer].  FINDINGS: A 120 kV prospective scan was triggered in the descending thoracic aorta at 111 HU's. Axial non-contrast 3 mm slices were carried out through the heart. The data set was analyzed on a dedicated work station and scored using the [place]. Gantry rotation speed was 250 msecs  and collimation was .6 mm. No beta blockade and 0.8 mg of sl NTG was given. The 3D data set was reconstructed in 5% intervals of the 67-82 % of  the R-R cycle. Diastolic phases were analyzed on a dedicated work station using MPR, MIP and VRT modes. The patient received 80 cc of contrast.  Aorta: Normal size. Scattered calcifications in the ascending and descending aorta. No dissection.  Aortic Valve:  Trileaflet.  No calcifications.  Mitral Valve: Mitral annular calcifications.  Coronary Arteries:  Normal coronary origin.  Right dominance.  RCA is a large dominant artery that gives rise to PDA and PLVB. There is no plaque.  Left main is a large artery that gives rise to LAD and LCX arteries. There is minimal plaque in the distal LM at the bifurcation of the LAD and LCx arteries with associated stenosis of 0-24%.  LAD is a large vessel that gives rise to a large branching diagonal. There is minimal calcified plaque in the mid LAD and the takeoff of a large D1 as well as distal LAD with associated stenosis of 0-24%.  LCX is a non-dominant artery that gives rise to one large OM1 branch. There is no plaque.  Other findings:  Normal pulmonary vein drainage into the left atrium.  Normal let atrial appendage without a thrombus.  Normal size of the pulmonary artery.  IMPRESSION: 1. Coronary calcium score of 26. This was 48th percentile for age and sex matched control.  2.  Normal coronary origin with right dominance.  3.  Minimal atherosclerosis.  CAD RADS 1.  4.  Consider non-atherosclerotic causes of chest pain.  Tressia Miners Turner  Monitor 09/2019  Sinus bradycardia, normal sinus rhythm and sinus tachycardia. The average heart rate was 71bpm and ranged from 58-103bpm.  Nonsutained atrial tachycardia up to 5 beats in a row.  2 episodes of sinus pauses with 3.5 second pauses. Once occurred during sleep at 30bpm and the other one occurred in the evening around  8:37pm with heart rate 30bpm.     EKG:  EKG is ordered today, personally reviewed, demonstrating NSR 86bpm, low voltage, nonspecific TW changes I, avL, left axis deviation, similar to prior  Recent Labs: 08/20/2019: BUN 20; Creatinine, Ser 1.23; Hemoglobin 10.7; NT-Pro BNP 189; Platelets 194; Potassium 4.0; Sodium 143; TSH 2.270  Recent Lipid Panel    Component Value Date/Time   CHOL 119 03/14/2018 1357   TRIG 99 03/14/2018 1357   HDL 44 03/14/2018 1357   CHOLHDL 2.7 03/14/2018 1357   LDLCALC 55 03/14/2018 1357    PHYSICAL EXAM:    VS:  BP 140/68   Pulse 86   Ht 5' 2"  (1.575 m)   Wt 183 lb (83 kg)   SpO2 96%   BMI 33.47 kg/m   BMI: Body mass index is 33.47 kg/m.  GEN: Well nourished, well develop AAF, in no acute distress, overweight HEENT: normocephalic, atraumatic Neck: no JVD, carotid bruits, or masses Cardiac: RRR; no murmurs, rubs, or gallops, no edema  Respiratory:  clear to auscultation bilaterally, normal work of breathing GI: soft, nontender, nondistended, + BS MS: no deformity or atrophy Skin: warm and dry, no rash Neuro:  Alert and Oriented x 3, Strength and sensation are intact, follows commands Psych: euthymic mood, full affect  Wt Readings from Last 3 Encounters:  11/12/19 183 lb (83 kg)  08/20/19 187 lb 9.6 oz (85.1 kg)  07/06/19 186 lb 9.6 oz (84.6 kg)     ASSESSMENT & PLAN:   1. Dizziness, unclear if related to BP or potentially sinus pauses - dizziness initially improved with change in amlodipine per phone note. She did have another episode 3 weeks ago, very  briefly/transiently, which I will review with Dr. Radford Pax given the patient's previous event monitor findings/sinus pauses. May be worthwhile to decrease metoprolol, but she is also on this for palpitations. Her blood pressure is mildly elevated today in clinic but recent readings were reviewed otherwise per phone note and were normal. She reports normal readings at home. Recent labs were unrevealing  with normal TSH. Warning sx reviewed. 2. Shortness of breath - chronic. Coronary CT unrevealing. Will update 2D echocardiogram to reassess LVEF and PA pressures. If stable, would suggest f/u primary care for further evaluation. Suspect deconditioning is a factor. 3. Mild coronary atherosclerosis - continue risk factor modification. She is due for lipids/LFTs. Will obtain when she gets her echo done and returns fasting. 4. Palpitations - recent event monitor showed one episode of nonsustained atrial tachycardia. The patient states she felt palpitations multiple times while wearing the monitor without corresponding arrhythmias otherwise, therefore it does not seem these are currently pathologic. Would continue clinical follow-up and consider repeat monitor if symptoms increase in frequency/change/worsen. Would not increase BB presently due to h/o sinus pauses.  Disposition: F/u with Dr. Radford Pax in 6 months.  Medication Adjustments/Labs and Tests Ordered: Current medicines are reviewed at length with the patient today.  Concerns regarding medicines are outlined above. Medication changes, Labs and Tests ordered today are summarized above and listed in the Patient Instructions accessible in Encounters.   Signed, Charlie Pitter, PA-C  11/12/2019 3:20 PM    [facility] [address], [place], [place]  [address] Phone: [phone]; Fax: [phone]

## 2019-11-12 ENCOUNTER — Ambulatory Visit (INDEPENDENT_AMBULATORY_CARE_PROVIDER_SITE_OTHER): Payer: Medicare Other | Admitting: Physician Assistant

## 2019-11-12 ENCOUNTER — Other Ambulatory Visit: Payer: Self-pay

## 2019-11-12 ENCOUNTER — Telehealth: Payer: Self-pay | Admitting: Physician Assistant

## 2019-11-12 ENCOUNTER — Encounter: Payer: Self-pay | Admitting: Physician Assistant

## 2019-11-12 VITALS — BP 140/68 | HR 86 | Ht 62.0 in | Wt 183.0 lb

## 2019-11-12 DIAGNOSIS — R42 Dizziness and giddiness: Secondary | ICD-10-CM

## 2019-11-12 DIAGNOSIS — I2510 Atherosclerotic heart disease of native coronary artery without angina pectoris: Secondary | ICD-10-CM

## 2019-11-12 DIAGNOSIS — I493 Ventricular premature depolarization: Secondary | ICD-10-CM

## 2019-11-12 DIAGNOSIS — R0602 Shortness of breath: Secondary | ICD-10-CM

## 2019-11-12 DIAGNOSIS — I455 Other specified heart block: Secondary | ICD-10-CM | POA: Diagnosis not present

## 2019-11-12 DIAGNOSIS — I471 Supraventricular tachycardia: Secondary | ICD-10-CM

## 2019-11-12 NOTE — Telephone Encounter (Signed)
   Please let pt know I discussed her recent symptom update (dizziness) with Dr. Radford Pax who agrees we should reduce her metoprolol to 25mg  BID. Would have her check BP once daily around lunch and call with results as we may need to increase one of her other BP medicines if her BP begins to rise with this change. Also call with any increasing episodes of dizziness/palpitations with this change. Jayanth Szczesniak PA-C

## 2019-11-12 NOTE — Patient Instructions (Signed)
Medication Instructions:  Your physician recommends that you continue on your current medications as directed. Please refer to the Current Medication list given to you today.  *If you need a refill on your cardiac medications before your next appointment, please call your pharmacy*   Lab Work: SAME DAY OF ECHO:  COME FASTING FOR:  LIPID & LFT  If you have labs (blood work) drawn today and your tests are completely normal, you will receive your results only by: [name] MyChart Message (if you have MyChart) OR . A paper copy in the mail If you have any lab test that is abnormal or we need to change your treatment, we will call you to review the results.   Testing/Procedures: Your physician has requested that you have an echocardiogram. Echocardiography is a painless test that uses sound waves to create images of your heart. It provides your doctor with information about the size and shape of your heart and how well your heart's chambers and valves are working. This procedure takes approximately one hour. There are no restrictions for this procedure.     Follow-Up: At [facility], you and your health needs are our priority.  As part of our continuing mission to provide you with exceptional heart care, we have created designated Provider Care Teams.  These Care Teams include your primary Cardiologist (physician) and Advanced Practice Providers (APPs -  Physician Assistants and Nurse Practitioners) who all work together to provide you with the care you need, when you need it.  We recommend signing up for the patient portal called "MyChart".  Sign up information is provided on this After Visit Summary.  MyChart is used to connect with patients for Virtual Visits (Telemedicine).  Patients are able to view lab/test results, encounter notes, upcoming appointments, etc.  Non-urgent messages can be sent to your provider as well.   To learn more about what you can do with MyChart, go to  [email or web address].    Your next appointment:   6 month(s)  The format for your next appointment:   In Person  Provider:   You may see Fransico Him, MD or one of the following Advanced Practice Providers on your designated Care Team:    Melina Copa, PA-C  Ermalinda Barrios, PA-C    Other Instructions  Echocardiogram An echocardiogram is a procedure that uses painless sound waves (ultrasound) to produce an image of the heart. Images from an echocardiogram can provide important information about:  Signs of coronary artery disease (CAD).  Aneurysm detection. An aneurysm is a weak or damaged part of an artery wall that bulges out from the normal force of blood pumping through the body.  Heart size and shape. Changes in the size or shape of the heart can be associated with certain conditions, including heart failure, aneurysm, and CAD.  Heart muscle function.  Heart valve function.  Signs of a past heart attack.  Fluid buildup around the heart.  Thickening of the heart muscle.  A tumor or infectious growth around the heart valves. Tell a health care provider about:  Any allergies you have.  All medicines you are taking, including vitamins, herbs, eye drops, creams, and over-the-counter medicines.  Any blood disorders you have.  Any surgeries you have had.  Any medical conditions you have.  Whether you are pregnant or may be pregnant. What are the risks? Generally, this is a safe procedure. However, problems may occur, including:  Allergic reaction to dye (contrast) that may be used during the procedure.  What happens before the procedure? No specific preparation is needed. You may eat and drink normally. What happens during the procedure?   An IV tube may be inserted into one of your veins.  You may receive contrast through this tube. A contrast is an injection that improves the quality of the pictures from your heart.  A gel will be applied to your  chest.  A wand-like tool (transducer) will be moved over your chest. The gel will help to transmit the sound waves from the transducer.  The sound waves will harmlessly bounce off of your heart to allow the heart images to be captured in real-time motion. The images will be recorded on a computer. The procedure may vary among health care providers and hospitals. What happens after the procedure?  You may return to your normal, everyday life, including diet, activities, and medicines, unless your health care provider tells you not to do that. Summary  An echocardiogram is a procedure that uses painless sound waves (ultrasound) to produce an image of the heart.  Images from an echocardiogram can provide important information about the size and shape of your heart, heart muscle function, heart valve function, and fluid buildup around your heart.  You do not need to do anything to prepare before this procedure. You may eat and drink normally.  After the echocardiogram is completed, you may return to your normal, everyday life, unless your health care provider tells you not to do that. This information is not intended to replace advice given to you by your health care provider. Make sure you discuss any questions you have with your health care provider. Document Revised: 04/20/2018 Document Reviewed: 01/31/2016 Elsevier Patient Education  [employer].

## 2019-11-13 MED ORDER — METOPROLOL TARTRATE 50 MG PO TABS: 25.0000 mg | ORAL_TABLET | Freq: Two times a day (BID) | ORAL | 0 refills | Status: DC

## 2019-11-13 NOTE — Telephone Encounter (Signed)
Call placed to pt regarding phone note below.  Pt has been made aware to reduce her Metoprolol to 1/2 tablet of 50 mg twice a day.  She has been advised to monitor her bp, daily, around lunch time, and to let us know if she is noticing a rise in her bp with this med change.  Pt also advised to let us know if she experiences increased palpitations or dizziness.  Pt verbalized understanding and was appreciative for the follow-up care.

## 2019-12-05 ENCOUNTER — Other Ambulatory Visit: Payer: Self-pay

## 2019-12-05 ENCOUNTER — Ambulatory Visit (HOSPITAL_COMMUNITY): Payer: Medicare Other | Attending: Cardiology

## 2019-12-05 ENCOUNTER — Other Ambulatory Visit: Payer: Medicare Other | Admitting: *Deleted

## 2019-12-05 DIAGNOSIS — I471 Supraventricular tachycardia: Secondary | ICD-10-CM

## 2019-12-05 DIAGNOSIS — I493 Ventricular premature depolarization: Secondary | ICD-10-CM

## 2019-12-05 DIAGNOSIS — R42 Dizziness and giddiness: Secondary | ICD-10-CM | POA: Insufficient documentation

## 2019-12-05 DIAGNOSIS — I2510 Atherosclerotic heart disease of native coronary artery without angina pectoris: Secondary | ICD-10-CM

## 2019-12-05 DIAGNOSIS — R0602 Shortness of breath: Secondary | ICD-10-CM | POA: Diagnosis not present

## 2019-12-05 DIAGNOSIS — I455 Other specified heart block: Secondary | ICD-10-CM | POA: Diagnosis not present

## 2019-12-05 LAB — HEPATIC FUNCTION PANEL
ALT: 31 IU/L (ref 0–32)
AST: 38 IU/L (ref 0–40)
Albumin: 4 g/dL (ref 3.7–4.7)
Alkaline Phosphatase: 99 IU/L (ref 44–121)
Bilirubin Total: 0.5 mg/dL (ref 0.0–1.2)
Bilirubin, Direct: 0.21 mg/dL (ref 0.00–0.40)
Total Protein: 7.4 g/dL (ref 6.0–8.5)

## 2019-12-05 LAB — LIPID PANEL
Chol/HDL Ratio: 3.2 ratio (ref 0.0–4.4)
Cholesterol, Total: 99 mg/dL — ABNORMAL LOW (ref 100–199)
HDL: 31 mg/dL — ABNORMAL LOW (ref >39)
LDL Chol Calc (NIH): 45 mg/dL (ref 0–99)
Triglycerides: 126 mg/dL (ref 0–149)
VLDL Cholesterol Cal: 23 mg/dL (ref 5–40)

## 2019-12-05 LAB — ECHOCARDIOGRAM COMPLETE
Area-P 1/2: 3.27 cm2
S' Lateral: 2.6 cm

## 2019-12-12 ENCOUNTER — Telehealth: Payer: Self-pay | Admitting: *Deleted

## 2019-12-12 DIAGNOSIS — I43 Cardiomyopathy in diseases classified elsewhere: Secondary | ICD-10-CM

## 2019-12-12 DIAGNOSIS — E854 Organ-limited amyloidosis: Secondary | ICD-10-CM

## 2019-12-12 NOTE — Telephone Encounter (Signed)
-----   Message from Charlie Pitter, [place] sent at 12/11/2019 12:44 PM EST ----- Discussed echo with Dr. Radford Pax. Please let her know the study looked fine except there is asymmetric thickening of the muscle of her heart. We do see this in patients with history of high blood pressure but Dr. Radford Pax suggests we also may need to consider a genetic condition called hypertrophic cardiomyopathy when seeing this finding. She would suggest obtaining a cardiac MRI to further evaluate this. Would need BMET beforehand to make sure creatinine still <1.5. We also need to find out if she has ever had any surgeries or injuries leaving metal behind in her body - I see prior back surgeries noted so please check with MRI team if this means she cannot have it. Thx!

## 2019-12-14 ENCOUNTER — Encounter: Payer: Self-pay | Admitting: Physician Assistant

## 2019-12-14 ENCOUNTER — Telehealth: Payer: Self-pay | Admitting: Physician Assistant

## 2019-12-14 NOTE — Telephone Encounter (Signed)
Spoke with patient regarding scheduled appointment for Cardiac MRI 01/03/20 at 12:00 pm---arrival time is 11:30 am at the 1st floor admissions office at [facility].  Will mail information to patient and it is also in My Chart.  Patient voiced her understanding.

## 2019-12-24 ENCOUNTER — Ambulatory Visit (INDEPENDENT_AMBULATORY_CARE_PROVIDER_SITE_OTHER): Payer: Medicare Other | Admitting: Podiatry

## 2019-12-24 ENCOUNTER — Other Ambulatory Visit: Payer: Self-pay

## 2019-12-24 DIAGNOSIS — M79675 Pain in left toe(s): Secondary | ICD-10-CM

## 2019-12-24 DIAGNOSIS — M79674 Pain in right toe(s): Secondary | ICD-10-CM | POA: Diagnosis not present

## 2019-12-24 DIAGNOSIS — B351 Tinea unguium: Secondary | ICD-10-CM | POA: Diagnosis not present

## 2019-12-24 DIAGNOSIS — E1149 Type 2 diabetes mellitus with other diabetic neurological complication: Secondary | ICD-10-CM

## 2019-12-24 DIAGNOSIS — G629 Polyneuropathy, unspecified: Secondary | ICD-10-CM | POA: Diagnosis not present

## 2019-12-24 DIAGNOSIS — Q828 Other specified congenital malformations of skin: Secondary | ICD-10-CM

## 2019-12-24 NOTE — Progress Notes (Signed)
Subjective: 79 y.o. returns the office today for painful, elongated, thickened toenails which she cannot trim herself. Denies any redness or drainage around the nails.  She has been taking the gabapentin as prescribed but it is making her more tired at night. Denies any systemic complaints such as fevers, chills, nausea, vomiting.   Objective: AAO 3, NAD DP/PT pulses palpable, CRT less than 3 seconds Sensation decreased with SWMF.  Nails hypertrophic, dystrophic, elongated, brittle, discolored 10. There is tenderness overlying the nails 1-5 bilaterally. There is no surrounding erythema or drainage along the nail sites. Callus right medial hallux without any underlying ulceration, drainage or signs of infection.  There is no area pinpoint tenderness.  There is to be increased edema, erythema. No open lesions or pre-ulcerative lesions are identified. No pain with calf compression, swelling, warmth, erythema.  Assessment: Patient presents with symptomatic onychomycosis; hyperkeratotic lesion; neuropathy  Plan: -Treatment options including alternatives, risks, complications were discussed -Nails sharply debrided 10 without complication/bleeding.   -Hyperkeratotic lesion sharply debrided x 1 without any complications or bleeding.  -She is going to decrease the gabapentin dose at night. She was taking 3 tablets and she is going to go down to 2.   Return in about 9 weeks   Trula Slade DPM

## 2019-12-28 ENCOUNTER — Ambulatory Visit: Payer: Medicare Other | Admitting: Internal Medicine

## 2019-12-31 ENCOUNTER — Other Ambulatory Visit: Payer: Self-pay | Admitting: Nephrology

## 2019-12-31 DIAGNOSIS — N1831 Chronic kidney disease, stage 3a: Secondary | ICD-10-CM

## 2020-01-01 ENCOUNTER — Telehealth (HOSPITAL_COMMUNITY): Payer: Self-pay | Admitting: *Deleted

## 2020-01-01 NOTE — Telephone Encounter (Signed)
Reaching out to patient to offer assistance regarding upcoming cardiac imaging study; pt verbalizes understanding of appt date/time, parking situation and where to check in, and verified current allergies; name and call back number provided for further questions should they arise  [facility] [phone] office [phone] cell

## 2020-01-03 ENCOUNTER — Other Ambulatory Visit: Payer: Self-pay

## 2020-01-03 ENCOUNTER — Ambulatory Visit (HOSPITAL_COMMUNITY)
Admission: RE | Admit: 2020-01-03 | Discharge: 2020-01-03 | Disposition: A | Discharge status: Home / Self Care | Payer: Medicare Other | Source: Ambulatory Visit | Attending: Physician Assistant | Admitting: Physician Assistant

## 2020-01-03 DIAGNOSIS — I43 Cardiomyopathy in diseases classified elsewhere: Secondary | ICD-10-CM

## 2020-01-03 DIAGNOSIS — E854 Organ-limited amyloidosis: Secondary | ICD-10-CM | POA: Diagnosis present

## 2020-01-03 MED ORDER — GADOBUTROL 1 MMOL/ML IV SOLN
8.0000 mL | Freq: Once | INTRAVENOUS | Status: AC | PRN
  Administered 2020-01-03: 8 mL via INTRAVENOUS

## 2020-01-17 ENCOUNTER — Ambulatory Visit
Admission: RE | Admit: 2020-01-17 | Discharge: 2020-01-17 | Disposition: A | Discharge status: Home / Self Care | Payer: Medicare Other | Source: Ambulatory Visit | Attending: Nephrology | Admitting: Nephrology

## 2020-01-17 DIAGNOSIS — N1831 Chronic kidney disease, stage 3a: Secondary | ICD-10-CM

## 2020-01-18 ENCOUNTER — Other Ambulatory Visit: Payer: Medicare Other

## 2020-01-30 ENCOUNTER — Other Ambulatory Visit: Payer: Self-pay

## 2020-02-01 ENCOUNTER — Ambulatory Visit: Payer: Medicare Other | Admitting: Internal Medicine

## 2020-02-01 NOTE — Progress Notes (Deleted)
`     Name: Nancy [name]  Age/ Sex: 80 y.o., female   MRN/ DOB: [id number], 1940/07/14     PCP: Buzzy Han, MD   Reason for Endocrinology Evaluation: Type 2 Diabetes Mellitus  Initial Endocrine Consultative Visit: 10/31/2017    PATIENT IDENTIFIER: [name] is a 80 y.o. female with a past medical history of HTN, Gout, Hyperlipidemia, and T2 DM . The patient has followed with Endocrinology clinic since 10/31/17 for consultative assistance with management of her diabetes.  DIABETIC HISTORY:  Nancy [name] was diagnosed with T2DM ~ in 2009, she has been on oral glycemic agents since her diagnosis. Has not been on insulin prior to her presentation here. Her hemoglobin A1c has ranged from 7.3% in 09/2015, peaking at 8.6% in 08/2015.   SUBJECTIVE:   During the last visit (07/06/2019): Her A1c was 6.0 %. Stopped Glipizide, restarted  Metformin, and continued Januvia     Today (02/01/2020): [name] is here for a 6 month follow up on diabetes management. She checks her blood sugars 1 time daily.The patient has not had had hypoglycemic episodes since the last clinic visit.Otherwise, the patient has not required any recent emergency interventions for hypoglycemia and has not had recent hospitalizations secondary to hyper or hypoglycemic episodes.        HOME DIABETES REGIMEN:   Metfomrin 1000 mg BID  Januvia 100 mg daily    METER DOWNLOAD SUMMARY: 6/12- 07/06/2019 Fingerstick Blood Glucose Tests = 6 Average Number Tests/Day = 145 Overall Mean FS Glucose = 0.4   BG Ranges: Low = 117 High = 171     Hypoglycemic Events/30 Days: BG < 50 = 0 Episodes of symptomatic severe hypoglycemia = 0         HISTORY:  Past Medical History:  Past Medical History:  Diagnosis Date  . Allergy   . Anemia   . Breast cyst    left breast  . CAD (coronary artery disease), native coronary artery    minimal ASCAD to the distal LM and mid LAD with 0-24%  stenosis and Calcium score of 26.  . Chest pain   . CKD (chronic kidney disease), stage III ([place])   . Complication of anesthesia 1986   slow to awaken after cholecystectomy  . Diastolic dysfunction    with mild pulmonary HTN by echo 07/2011  . Gastric ulcer   . GERD (gastroesophageal reflux disease)   . Gout   . Hyperlipidemia   . Hypertension   . 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 Osteoarthritis   . Osteopenia   . Shortness of breath dyspnea    with activity  . Sinus pause   . Spine deformity    lower spine  . Type II diabetes mellitus ([place])    Past Surgical History:  Past Surgical History:  Procedure Laterality Date  . ABDOMINAL ADHESION SURGERY    . ABDOMINAL HYSTERECTOMY    . ANUS SURGERY     for torn tissue  . APPENDECTOMY    . BREAST BIOPSY    . BREAST LUMPECTOMY     left  . CHOLECYSTECTOMY  1986  . COLONOSCOPY    . ESOPHAGOGASTRODUODENOSCOPY (EGD) WITH PROPOFOL N/A 07/18/2013   Procedure: ESOPHAGOGASTRODUODENOSCOPY (EGD) WITH PROPOFOL;  Surgeon: Arta Silence, MD;  [facility];  Service: Endoscopy;  Laterality: N/A;  . EUS N/A 07/18/2013   Procedure: ESOPHAGEAL ENDOSCOPIC ULTRASOUND (EUS) RADIAL;  Surgeon: Arta Silence, MD;  [facility];  Service: Endoscopy;  Laterality: N/A;  . IR GENERIC HISTORICAL  10/30/2015   IR US GUIDE VASC ACCESS RIGHT 10/30/2015 Saverio Danker, PA-C [facility]  . IR GENERIC HISTORICAL  10/30/2015   IR FLUORO GUIDE CV LINE RIGHT 10/30/2015 Saverio Danker, PA-C [facility]  . LUMBAR WOUND DEBRIDEMENT N/A 10/01/2015   Procedure: LUMBAR WOUND DEBRIDEMENT;  Surgeon: Melina Schools, MD;  Location: [place];  Service: Orthopedics;  Laterality: N/A;  . LUMBAR WOUND DEBRIDEMENT N/A 10/15/2015   Procedure: [facility] OUT AND CLOSURE OF BACK WOUND;  Surgeon: Melina Schools, MD;  Location: [place];  Service: Orthopedics;  Laterality: N/A;  . MASS EXCISION Left 07/24/2015   Procedure: EXCISION MASS LEFT LONG FINGER;  Surgeon: Leanora Cover, MD;  Location: [place];  Service: Orthopedics;  Laterality: Left;  . SPINAL FUSION N/A 09/17/2015   Procedure: FUSION POSTERIOR SPINAL MULTILEVEL L4- S1;  Surgeon: Melina Schools, MD;  Location: [place];  Service: Orthopedics;  Laterality: N/A;  . TRANSFORAMINAL LUMBAR INTERBODY FUSION (TLIF) WITH PEDICLE SCREW FIXATION 1 LEVEL N/A 09/17/2015   Procedure: TRANSFORAMINAL LUMBAR INTERBODY FUSION (TLIF) WITH PEDICLE SCREW FIXATION 1 LEVEL , Lumbar 4-5;  Surgeon: Melina Schools, MD;  Location: [place];  Service: Orthopedics;  Laterality: N/A;    Social History:  reports that she has never smoked. She has never used smokeless tobacco. She reports that she does not drink alcohol and does not use drugs. Family History:  Family History  Problem Relation Age of Onset  . Heart disease Mother   . Cancer Sister        ovarian     HOME MEDICATIONS: Allergies as of 02/01/2020      Reactions   Morphine And Related Swelling   10/02/15 - given Toradol + morphine and RN noted tongue swelling - given Epi + Benadryl Has had morphine in past w/o problem   Toradol [ketorolac Tromethamine] Swelling   10/02/15 - given Toradol + morphine and RN noted tongue swelling - given Epi + Benadryl Has taken ibuprofen in past w/o problem   Codeine Other (See Comments)   headache      Medication List       Accurate as of February 01, 2020  9:34 AM. If you have any questions, ask your nurse or doctor.        allopurinol 300 MG tablet Commonly known as: ZYLOPRIM Take 300 mg by mouth daily.   amLODipine 5 MG tablet Commonly known as: NORVASC Take 5 mg by mouth daily.   aspirin EC 81 MG tablet Take 81 mg by mouth daily.   atorvastatin 10 MG tablet Commonly known as: LIPITOR Take 1 tablet by mouth once daily   Colcrys 0.6 MG tablet Generic drug: colchicine Take 0.6 mg by mouth daily as needed (Gout).   cyclobenzaprine 10 MG tablet Commonly known as: FLEXERIL Take 10-20 mg by mouth 3  (three) times daily as needed.   Durezol 0.05 % Emul Generic drug: Difluprednate 1-2 drops as directed. For dry eyes   furosemide 20 MG tablet Commonly known as: LASIX Take 1 tablet (20 mg total) by mouth every other day.   gabapentin 300 MG capsule Commonly known as: NEURONTIN Take 300 mg by mouth 3 (three) times daily.   glucose blood test strip Use as instructed to test blood sugars 2 times daily E11.65   Januvia 100 MG tablet Generic drug: sitaGLIPtin Take 1 tablet (100 mg total) by mouth daily.   losartan 50 MG tablet Commonly  known as: COZAAR Take 50 mg by mouth daily.   metFORMIN 1000 MG tablet Commonly known as: GLUCOPHAGE Take 1 tablet (1,000 mg total) by mouth 2 (two) times daily with a meal.   metoprolol tartrate 50 MG tablet Commonly known as: LOPRESSOR Take 0.5 tablets (25 mg total) by mouth 2 (two) times daily.   Multiple Vitamins tablet Take 1 tablet by mouth daily.   omeprazole 20 MG capsule Commonly known as: PRILOSEC Take 20 mg by mouth as needed.   OneTouch Verio IQ System w/Device Kit 1 kit by Does not apply route 2 (two) times daily. E11.65   potassium chloride SA 20 MEQ tablet Commonly known as: KLOR-CON Take 1 tablet (20 mEq total) by mouth daily.   vitamin B-12 1000 MCG tablet Commonly known as: CYANOCOBALAMIN Take 1,000 mcg by mouth daily.        OBJECTIVE:   Vital Signs: There were no vitals taken for this visit.  Wt Readings from Last 3 Encounters:  11/12/19 183 lb (83 kg)  08/20/19 187 lb 9.6 oz (85.1 kg)  07/06/19 186 lb 9.6 oz (84.6 kg)     Exam: General: Pt appears well and is in NAD  Lungs: Clear with good BS bilat with no rales, rhonchi, or wheezes  Heart: RRR with normal S1 and S2 and no gallops; no murmurs; no rub  Extremities: Trace pretibial edema.   Neuro: MS is good with appropriate affect, pt is alert and Ox3    DM foot exam:(01/01/19)  The skin of the feet is intact without sores or ulcerations. The  pedal pulses are 2+ on right and 2+ on left. The sensation is decreased  to a screening 5.07, 10 gram monofilament on the right.         DATA REVIEWED:  Lab Results  Component Value Date   LDLCALC 45 12/05/2019   CREATININE 1.23 (H) 08/20/2019     Results for STASIA, SOMERO (MRN [id number]) as of 07/09/2019 07:14  Ref. Range 07/06/2019 14:49  Microalbumin, Urine Latest Ref Range: Not Estab. ug/mL 17.3  MICROALB/CREAT RATIO Latest Ref Range: 0 - 29 mg/g creat 22  Creatinine, Urine Latest Ref Range: Not Estab. mg/dL 79.3    ASSESSMENT / PLAN / RECOMMENDATIONS:   1) Type 2 Diabetes Mellitus, With Neuropathic complications - Most recent A1c of 6.0 %. Goal A1c < 7.0 %.     - Pt with an A1c 6.0%. There's no evidence of hypoglycemia at this time . Pt takes Glipizide sporadically, she also reduced metformin from 2x to 1x a day for no specific reasons, she denies GI side effects.  - We discussed importance of consistency with taking medications, I am going to stop Glipizide and restart metformin at BID dosing at this time.    MEDICATIONS:  - Restart Metformin 1000 mg Twice a day - Continue Januvia 100 mg daily      EDUCATION / INSTRUCTIONS:  BG monitoring instructions: Patient is instructed to check her blood sugars 1 times a day, fasting   Call [facility] if: BG persistently < 70  . I reviewed the Rule of 15 for the treatment of hypoglycemia in detail with the patient. Literature supplied.   2) Diabetic complications:   Eye: Does not have known diabetic retinopathy. Last eye exam was   Neuro/ Feet: Does  have known diabetic peripheral neuropathy.  Renal: Patient does  have known baseline CKD. She is on an ACEI/ARB at present. Normal Ma/Cr ratio  F/U in 6 months   Signed electronically by: Mack Guise, MD  [facility]  [facility] [address] [place], [place] [address] Phone:  [phone] FAX: [phone]   CC:  Buzzy Han, [place] [address] Phone: [phone]  Fax: [phone]  Return to Endocrinology clinic as below: Future Appointments  Date Time Provider [place]  02/01/2020 10:10 AM Tlaloc Taddei, Melanie Crazier, MD [facility] None  02/25/2020 10:15 AM Trula Slade, DPM [facility]

## 2020-02-25 ENCOUNTER — Other Ambulatory Visit: Payer: Self-pay

## 2020-02-25 ENCOUNTER — Ambulatory Visit (INDEPENDENT_AMBULATORY_CARE_PROVIDER_SITE_OTHER): Payer: Medicare Other | Admitting: Podiatry

## 2020-02-25 DIAGNOSIS — G629 Polyneuropathy, unspecified: Secondary | ICD-10-CM | POA: Diagnosis not present

## 2020-02-25 DIAGNOSIS — L819 Disorder of pigmentation, unspecified: Secondary | ICD-10-CM | POA: Diagnosis not present

## 2020-02-25 DIAGNOSIS — M79675 Pain in left toe(s): Secondary | ICD-10-CM | POA: Diagnosis not present

## 2020-02-25 DIAGNOSIS — M79674 Pain in right toe(s): Secondary | ICD-10-CM

## 2020-02-25 DIAGNOSIS — E1149 Type 2 diabetes mellitus with other diabetic neurological complication: Secondary | ICD-10-CM

## 2020-02-25 DIAGNOSIS — B351 Tinea unguium: Secondary | ICD-10-CM

## 2020-02-26 ENCOUNTER — Other Ambulatory Visit: Payer: Self-pay | Admitting: Cardiology

## 2020-02-27 NOTE — Progress Notes (Signed)
Subjective: 80 y.o. returns the office today for painful, elongated, thickened toenails which she cannot trim herself. Denies any redness or drainage around the nails..  Denies any systemic complaints such as fevers, chills, nausea, vomiting.   Objective: AAO 3, NAD DP/PT pulses palpable, CRT less than 3 seconds Sensation decreased with SWMF.  Nails hypertrophic, dystrophic, elongated, brittle, discolored 10. There is tenderness overlying the nails 1-5 bilaterally. There is no surrounding erythema or drainage along the nail sites. On the plantar aspect left foot there is a new hyperpigmented lesion although flat.  No change or pus or any pain. No pain with calf compression, swelling, warmth, erythema.  Assessment: Patient presents with symptomatic onychomycosis; neuropathy; hyperpigmented skin lesion  Plan: -Treatment options including alternatives, risks, complications were discussed -Nails sharply debrided 10 without complication/bleeding.   -Given hyperpigmented lesion on the plantar aspect of the left foot which is new, refer to dermatology for further evaluation.  -Continue gabapentin.  Return in about 9 weeks   Trula Slade DPM

## 2020-02-28 ENCOUNTER — Other Ambulatory Visit: Payer: Self-pay

## 2020-02-28 ENCOUNTER — Telehealth: Payer: Self-pay | Admitting: Dermatology

## 2020-02-28 NOTE — Telephone Encounter (Signed)
Referral attached to appointment

## 2020-02-28 NOTE — Telephone Encounter (Signed)
Patient called for a referral appointment from Dr. Celesta Gentile.  Patient is scheduled for 07/28/2020 at 11:00 with Lavonna Monarch, MD.

## 2020-03-03 ENCOUNTER — Ambulatory Visit (INDEPENDENT_AMBULATORY_CARE_PROVIDER_SITE_OTHER): Payer: Medicare Other | Admitting: Internal Medicine

## 2020-03-03 ENCOUNTER — Encounter: Payer: Self-pay | Admitting: Internal Medicine

## 2020-03-03 ENCOUNTER — Other Ambulatory Visit: Payer: Self-pay

## 2020-03-03 VITALS — BP 160/80 | HR 82 | Ht 62.0 in | Wt 186.0 lb

## 2020-03-03 DIAGNOSIS — E1165 Type 2 diabetes mellitus with hyperglycemia: Secondary | ICD-10-CM | POA: Diagnosis not present

## 2020-03-03 DIAGNOSIS — G63 Polyneuropathy in diseases classified elsewhere: Secondary | ICD-10-CM | POA: Diagnosis not present

## 2020-03-03 DIAGNOSIS — E1122 Type 2 diabetes mellitus with diabetic chronic kidney disease: Secondary | ICD-10-CM | POA: Diagnosis not present

## 2020-03-03 DIAGNOSIS — E1142 Type 2 diabetes mellitus with diabetic polyneuropathy: Secondary | ICD-10-CM

## 2020-03-03 DIAGNOSIS — N1832 Chronic kidney disease, stage 3b: Secondary | ICD-10-CM

## 2020-03-03 LAB — POCT GLYCOSYLATED HEMOGLOBIN (HGB A1C): Hemoglobin A1C: 6.1 % — AB (ref 4.0–5.6)

## 2020-03-03 MED ORDER — METFORMIN HCL 1000 MG PO TABS
1000.0000 mg | ORAL_TABLET | Freq: Every day | ORAL | 3 refills | Status: DC
Start: 2020-03-03 — End: 2021-01-28

## 2020-03-03 NOTE — Patient Instructions (Addendum)
 -   Decrease Metformin 1000 mg one tablet day  - Continue Januvia 100 mg daily     Try Capsaicin cream for neuropathy - you can get this over the counter      - HOW TO TREAT LOW BLOOD SUGARS (Blood sugar LESS THAN 70 MG/DL)  Please follow the RULE OF 15 for the treatment of hypoglycemia treatment (when your (blood sugars are less than 70 mg/dL)    STEP 1: Take 15 grams of carbohydrates when your blood sugar is low, which includes:   3-4 GLUCOSE TABS  OR  3-4 OZ OF JUICE OR REGULAR SODA OR  ONE TUBE OF GLUCOSE GEL     STEP 2: RECHECK blood sugar in 15 MINUTES STEP 3: If your blood sugar is still low at the 15 minute recheck --> then, go back to STEP 1 and treat AGAIN with another 15 grams of carbohydrates.

## 2020-03-03 NOTE — Progress Notes (Signed)
`     Name: Nancy [name]  Age/ Sex: 80 y.o., female   MRN/ DOB: [id number], Oct 06, 1940     PCP: Buzzy Han, MD   Reason for Endocrinology Evaluation: Type 2 Diabetes Mellitus  Initial Endocrine Consultative Visit: 10/31/2017    PATIENT IDENTIFIER: Nancy [name] is a 80 y.o. female with a past medical history of HTN, Gout, Hyperlipidemia, and T2 DM . The patient has followed with Endocrinology clinic since 10/31/17 for consultative assistance with management of her diabetes.  DIABETIC HISTORY:  Nancy [name] was diagnosed with T2DM ~ in 2009, she has been on oral glycemic agents since her diagnosis. Has not been on insulin prior to her presentation here. Her hemoglobin A1c has ranged from 7.3% in 09/2015, peaking at 8.6% in 08/2015.  Glipizide stopped 06/2019 with an A1c of 6.0 %    SUBJECTIVE:   During the last visit (07/06/2019): Her A1c was 6. 0%.  continued  Metformin, and  Januvia and stopped  Glipizide .    Today (03/03/2020): Nancy [name] is here for a 6 month follow up on diabetes management. She checks her blood sugars occasionally.The patient has not had had hypoglycemic episodes since the last clinic visit.    Denies nausea or diarrhea  But neuropathy has been bothersome despite taking Gabapentin 300 mg, 2 tabs TID     BP this morning was 140/70 and did not take her BP meds   HOME DIABETES REGIMEN:   Metfomrin 1000 mg BID  Januvia 100 mg daily    METER DOWNLOAD SUMMARY: 2/7-2/21/2022  Average Number Tests/Day = 166 Overall Mean FS Glucose = 0.5   BG Ranges: Low = 131 High = 219    Hypoglycemic Events/30 Days: BG < 50 = 0 Episodes of symptomatic severe hypoglycemia = 0     HISTORY:  Past Medical History:  Past Medical History:  Diagnosis Date  . Allergy   . Anemia   . Breast cyst    left breast  . CAD (coronary artery disease), native coronary artery    minimal ASCAD to the distal LM and mid LAD with 0-24% stenosis  and Calcium score of 26.  . Chest pain   . CKD (chronic kidney disease), stage III ([place])   . Complication of anesthesia 1986   slow to awaken after cholecystectomy  . Diastolic dysfunction    with mild pulmonary HTN by echo 07/2011  . Gastric ulcer   . GERD (gastroesophageal reflux disease)   . Gout   . Hyperlipidemia   . Hypertension   . 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 Osteoarthritis   . Osteopenia   . Shortness of breath dyspnea    with activity  . Sinus pause   . Spine deformity    lower spine  . Type II diabetes mellitus ([place])    Past Surgical History:  Past Surgical History:  Procedure Laterality Date  . ABDOMINAL ADHESION SURGERY    . ABDOMINAL HYSTERECTOMY    . ANUS SURGERY     for torn tissue  . APPENDECTOMY    . BREAST BIOPSY    . BREAST LUMPECTOMY     left  . CHOLECYSTECTOMY  1986  . COLONOSCOPY    . ESOPHAGOGASTRODUODENOSCOPY (EGD) WITH PROPOFOL N/A 07/18/2013   Procedure: ESOPHAGOGASTRODUODENOSCOPY (EGD) WITH PROPOFOL;  Surgeon: Arta Silence, MD;  [facility];  Service: Endoscopy;  Laterality: N/A;  . EUS N/A 07/18/2013   Procedure: ESOPHAGEAL ENDOSCOPIC ULTRASOUND (EUS) RADIAL;  Surgeon: Arta Silence, MD;  Location: [facility];  Service: Endoscopy;  Laterality: N/A;  . IR GENERIC HISTORICAL  10/30/2015   IR US GUIDE VASC ACCESS RIGHT 10/30/2015 Saverio Danker, PA-C [facility]  . IR GENERIC HISTORICAL  10/30/2015   IR FLUORO GUIDE CV LINE RIGHT 10/30/2015 Saverio Danker, PA-C [facility]  . LUMBAR WOUND DEBRIDEMENT N/A 10/01/2015   Procedure: LUMBAR WOUND DEBRIDEMENT;  Surgeon: Melina Schools, MD;  Location: [place];  Service: Orthopedics;  Laterality: N/A;  . LUMBAR WOUND DEBRIDEMENT N/A 10/15/2015   Procedure: [facility] OUT AND CLOSURE OF BACK WOUND;  Surgeon: Melina Schools, MD;  Location: [place];  Service: Orthopedics;  Laterality: N/A;  . MASS EXCISION Left 07/24/2015   Procedure: EXCISION MASS LEFT LONG FINGER;  Surgeon: Leanora Cover, MD;   Location: [place];  Service: Orthopedics;  Laterality: Left;  . SPINAL FUSION N/A 09/17/2015   Procedure: FUSION POSTERIOR SPINAL MULTILEVEL L4- S1;  Surgeon: Melina Schools, MD;  Location: [place];  Service: Orthopedics;  Laterality: N/A;  . TRANSFORAMINAL LUMBAR INTERBODY FUSION (TLIF) WITH PEDICLE SCREW FIXATION 1 LEVEL N/A 09/17/2015   Procedure: TRANSFORAMINAL LUMBAR INTERBODY FUSION (TLIF) WITH PEDICLE SCREW FIXATION 1 LEVEL , Lumbar 4-5;  Surgeon: Melina Schools, MD;  Location: [place];  Service: Orthopedics;  Laterality: N/A;    Social History:  reports that she has never smoked. She has never used smokeless tobacco. She reports that she does not drink alcohol and does not use drugs. Family History:  Family History  Problem Relation Age of Onset  . Heart disease Mother   . Cancer Sister        ovarian     HOME MEDICATIONS: Allergies as of 03/03/2020      Reactions   Morphine And Related Swelling   10/02/15 - given Toradol + morphine and RN noted tongue swelling - given Epi + Benadryl Has had morphine in past w/o problem   Toradol [ketorolac Tromethamine] Swelling   10/02/15 - given Toradol + morphine and RN noted tongue swelling - given Epi + Benadryl Has taken ibuprofen in past w/o problem   Codeine Other (See Comments)   headache      Medication List       Accurate as of March 03, 2020  2:37 PM. If you have any questions, ask your nurse or doctor.        STOP taking these medications   potassium chloride SA 20 MEQ tablet Commonly known as: KLOR-CON Stopped by: Dorita Sciara, MD     TAKE these medications   allopurinol 300 MG tablet Commonly known as: ZYLOPRIM Take 300 mg by mouth daily.   amLODipine 5 MG tablet Commonly known as: NORVASC Take 5 mg by mouth daily.   aspirin EC 81 MG tablet Take 81 mg by mouth daily.   atorvastatin 10 MG tablet Commonly known as: LIPITOR Take 1 tablet by mouth once daily   colchicine 0.6 MG  tablet Take 0.6 mg by mouth daily as needed (Gout).   cyclobenzaprine 10 MG tablet Commonly known as: FLEXERIL Take 10-20 mg by mouth 3 (three) times daily as needed.   Durezol 0.05 % Emul Generic drug: Difluprednate 1-2 drops as directed. For dry eyes   furosemide 20 MG tablet Commonly known as: LASIX Take 1 tablet (20 mg total) by mouth every other day.   gabapentin 300 MG capsule Commonly known as: NEURONTIN Take 300 mg by mouth 3 (three) times daily.   glucose blood test  strip Use as instructed to test blood sugars 2 times daily E11.65   Januvia 100 MG tablet Generic drug: sitaGLIPtin Take 1 tablet (100 mg total) by mouth daily.   losartan 50 MG tablet Commonly known as: COZAAR Take 50 mg by mouth daily.   metFORMIN 1000 MG tablet Commonly known as: GLUCOPHAGE Take 1 tablet (1,000 mg total) by mouth daily with breakfast. What changed: when to take this Changed by: Dorita Sciara, MD   metoprolol tartrate 50 MG tablet Commonly known as: LOPRESSOR Take 0.5 tablets (25 mg total) by mouth 2 (two) times daily.   Multiple Vitamins tablet Take 1 tablet by mouth daily.   omeprazole 20 MG capsule Commonly known as: PRILOSEC Take 20 mg by mouth as needed.   OneTouch Verio IQ System w/Device Kit 1 kit by Does not apply route 2 (two) times daily. E11.65   vitamin B-12 1000 MCG tablet Commonly known as: CYANOCOBALAMIN Take 1,000 mcg by mouth daily.        OBJECTIVE:   Vital Signs: BP (!) 160/92   Pulse 82   Ht 5' 2"  (1.575 m)   Wt 186 lb (84.4 kg)   SpO2 98%   BMI 34.02 kg/m   Wt Readings from Last 3 Encounters:  03/03/20 186 lb (84.4 kg)  11/12/19 183 lb (83 kg)  08/20/19 187 lb 9.6 oz (85.1 kg)     Exam: General: Pt appears well and is in NAD  Lungs: Clear with good BS bilat with no rales, rhonchi, or wheezes  Heart: RRR with normal S1 and S2 and no gallops; no murmurs; no rub  Extremities: Trace pretibial edema.   Neuro: MS is good with  appropriate affect, pt is alert and Ox3    DM foot exam: 03/03/2020    The skin of the feet is intact without sores or ulcerations. The pedal pulses are 2+ on right and 2+ on left. The sensation is decreased  to a screening 5.07, 10 gram monofilament on the right.         DATA REVIEWED:  Lab Results  Component Value Date   LDLCALC 45 12/05/2019   CREATININE 1.23 (H) 08/20/2019     Results for GRASIELA, JONSSON (MRN [id number]) as of 07/09/2019 07:14  Ref. Range 07/06/2019 14:49  Microalbumin, Urine Latest Ref Range: Not Estab. ug/mL 17.3  MICROALB/CREAT RATIO Latest Ref Range: 0 - 29 mg/g creat 22  Creatinine, Urine Latest Ref Range: Not Estab. mg/dL 79.3   12/27/2019  BUN/Cr 26/1.290  LDL 45 Tg 126   Calculated GFR 42.75  ASSESSMENT / PLAN / RECOMMENDATIONS:   1) Type 2 Diabetes Mellitus, Optimally controlled, With Neuropathic complications - Most recent A1c of 6.1 %. Goal A1c < 7.0 %.     - A1c continues to be at goal, no hypoglycemia  - Given GFR less then 45 , will reduce Metformin by 50 % as below     MEDICATIONS:  - Decrease Metformin 1000 mg ONCE daily  - Continue Januvia 100 mg daily      EDUCATION / INSTRUCTIONS:  BG monitoring instructions: Patient is instructed to check her blood sugars 1 times a day, fasting   Call [facility] if: BG persistently < 70  . I reviewed the Rule of 15 for the treatment of hypoglycemia in detail with the patient. Literature supplied.   2) Diabetic complications:   Eye: Does not have known diabetic retinopathy.   Neuro/ Feet: Does  have known diabetic peripheral  neuropathy.  Renal: Patient does  have known baseline CKD. She is on an ACEI/ARB at present. Normal Ma/Cr ratio    3) HTN:   - She did not take her BP meds yet, usually takes it in the afternoon but because she didn't eat , she skipped. We discussed increased risk of CVA with fluctuating BP readings. We emphasized importance of  taking BP meds at the same time daily     4) Peripheral Neuropathy:   - She is on Gabapentin 300 mg 2 tabs TID. She continues to have symptoms - Pt to try OTC Capsaicin cream PRN  - Will consider switching to Lyrica if not helpful.     F/U in 6 months   Signed electronically by: Mack Guise, MD  [facility]  [facility] [address] [place], [place] [address] Phone: [phone] FAX: [phone]   CC:  [address]uzzy [address], [place] [address] Phone: [phone]  Fax: [phone]  Return to Endocrinology clinic as below: Future Appointments  Date Time Provider [place]  04/29/2020  1:45 PM Trula Slade, DPM [facility]  07/28/2020 11:00 AM Lavonna Monarch, MD [facility]

## 2020-03-18 ENCOUNTER — Other Ambulatory Visit: Payer: Self-pay

## 2020-03-18 ENCOUNTER — Encounter: Payer: Self-pay | Admitting: Cardiology

## 2020-03-18 ENCOUNTER — Ambulatory Visit (INDEPENDENT_AMBULATORY_CARE_PROVIDER_SITE_OTHER): Payer: Medicare Other | Admitting: Cardiology

## 2020-03-18 VITALS — BP 130/58 | HR 80 | Ht 61.5 in | Wt 188.0 lb

## 2020-03-18 DIAGNOSIS — I493 Ventricular premature depolarization: Secondary | ICD-10-CM

## 2020-03-18 DIAGNOSIS — I10 Essential (primary) hypertension: Secondary | ICD-10-CM

## 2020-03-18 DIAGNOSIS — G4733 Obstructive sleep apnea (adult) (pediatric): Secondary | ICD-10-CM | POA: Diagnosis not present

## 2020-03-18 DIAGNOSIS — R072 Precordial pain: Secondary | ICD-10-CM

## 2020-03-18 DIAGNOSIS — R0602 Shortness of breath: Secondary | ICD-10-CM

## 2020-03-18 DIAGNOSIS — I272 Pulmonary hypertension, unspecified: Secondary | ICD-10-CM | POA: Diagnosis not present

## 2020-03-18 LAB — D-DIMER, QUANTITATIVE: D-DIMER: 1.94 mg/L FEU — ABNORMAL HIGH (ref 0.00–0.49)

## 2020-03-18 NOTE — Progress Notes (Signed)
Cardiology Office Note:    Date:  03/18/2020   ID:  Nancy [name], DOB 08-Jan-1941, MRN [id number]  PCP:  Buzzy Han, MD  Cardiologist:  Fransico Him, MD    Referring MD: Buzzy Han*   Chief Complaint  Patient presents with  . Hypertension  . Sleep Apnea    History of Present Illness:    Nancy [name] is a 80 y.o. female with a hx of HTN and diastolic dysfunction.She also has a history of mild pulmonary hypertension by 2D echocardiogram in July 2013.She had a Lexiscan Myoview for chest pain 10/13/2016 which showed no inducible ischemia. 2D echo in 2019 showed  normal LVF with EF 60-65% with moderate LVH and elevated filling pressures. She also has a hx of OSA and is on PAP therapy.   When I saw her last she was having atypical CP and DOE and coronary CTA was done which showed minimal CAD of the LM, mid and distal LAD and her CP was felt to be non cardiac in origin. 2D echo was normal with severe ASSH, no PHTN and cardiac MRI was done showing normal LVF with EF 73%, moderate BSH, normal RV and no evidence of cardiac amyloid or HOCM and findings c/w hypertensive heart disease.   She is here today for followup and is doing well.  She tells me that she still gets SOB and has a heaviness on her chest with exertion.  She has gained some weight.  She denies any PND, orthopnea, LE edema, dizziness, palpitations or syncope. She is compliant with her meds and is tolerating meds with no SE.    She is doing well with her CPAP device and thinks that she has gotten used to it.  She tolerates the mask and feels the pressure is adequate.  Since going on CPAP she feels rested in the am and has no significant daytime sleepiness.  She denies any significant mouth or nasal dryness or nasal congestion.  She does not think that he snores.     Past Medical History:  Diagnosis Date  . Allergy   . Anemia   . Breast cyst    left breast  . CAD (coronary artery disease),  native coronary artery    minimal ASCAD to the distal LM and mid LAD with 0-24% stenosis and Calcium score of 26.  . Chest pain   . CKD (chronic kidney disease), stage III ([place])   . Complication of anesthesia 1986   slow to awaken after cholecystectomy  . Diastolic dysfunction    with mild pulmonary HTN by echo 07/2011  . Gastric ulcer   . GERD (gastroesophageal reflux disease)   . Gout   . Hyperlipidemia   . Hypertension   . 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 Osteoarthritis   . Osteopenia   . Shortness of breath dyspnea    with activity  . Sinus pause   . Spine deformity    lower spine  . Type II diabetes mellitus ([place])     Past Surgical History:  Procedure Laterality Date  . ABDOMINAL ADHESION SURGERY    . ABDOMINAL HYSTERECTOMY    . ANUS SURGERY     for torn tissue  . APPENDECTOMY    . BREAST BIOPSY    . BREAST LUMPECTOMY     left  . CHOLECYSTECTOMY  1986  . COLONOSCOPY    . ESOPHAGOGASTRODUODENOSCOPY (EGD) WITH PROPOFOL N/A 07/18/2013   Procedure: ESOPHAGOGASTRODUODENOSCOPY (EGD) WITH PROPOFOL;  Surgeon:  Arta Silence, MD;  Location: [facility];  Service: Endoscopy;  Laterality: N/A;  . EUS N/A 07/18/2013   Procedure: ESOPHAGEAL ENDOSCOPIC ULTRASOUND (EUS) RADIAL;  Surgeon: Arta Silence, MD;  Location: [facility];  Service: Endoscopy;  Laterality: N/A;  . IR GENERIC HISTORICAL  10/30/2015   IR US GUIDE VASC ACCESS RIGHT 10/30/2015 Saverio Danker, PA-C [facility]  . IR GENERIC HISTORICAL  10/30/2015   IR FLUORO GUIDE CV LINE RIGHT 10/30/2015 Saverio Danker, PA-C [facility]  . LUMBAR WOUND DEBRIDEMENT N/A 10/01/2015   Procedure: LUMBAR WOUND DEBRIDEMENT;  Surgeon: Melina Schools, MD;  Location: [place];  Service: Orthopedics;  Laterality: N/A;  . LUMBAR WOUND DEBRIDEMENT N/A 10/15/2015   Procedure: [facility] OUT AND CLOSURE OF BACK WOUND;  Surgeon: Melina Schools, MD;  Location: [place];  Service: Orthopedics;  Laterality: N/A;  . MASS EXCISION Left 07/24/2015    Procedure: EXCISION MASS LEFT LONG FINGER;  Surgeon: Leanora Cover, MD;  Location: [place];  Service: Orthopedics;  Laterality: Left;  . SPINAL FUSION N/A 09/17/2015   Procedure: FUSION POSTERIOR SPINAL MULTILEVEL L4- S1;  Surgeon: Melina Schools, MD;  Location: [place];  Service: Orthopedics;  Laterality: N/A;  . TRANSFORAMINAL LUMBAR INTERBODY FUSION (TLIF) WITH PEDICLE SCREW FIXATION 1 LEVEL N/A 09/17/2015   Procedure: TRANSFORAMINAL LUMBAR INTERBODY FUSION (TLIF) WITH PEDICLE SCREW FIXATION 1 LEVEL , Lumbar 4-5;  Surgeon: Melina Schools, MD;  Location: [place];  Service: Orthopedics;  Laterality: N/A;    Current Medications: Current Meds  Medication Sig  . allopurinol (ZYLOPRIM) 300 MG tablet Take 300 mg by mouth daily.   [name] amLODipine (NORVASC) 5 MG tablet Take 5 mg by mouth daily.  [name] aspirin EC 81 MG tablet Take 81 mg by mouth daily.  [name] atorvastatin (LIPITOR) 10 MG tablet Take 1 tablet by mouth once daily  . Blood Glucose Monitoring Suppl (ONETOUCH VERIO IQ SYSTEM) w/Device KIT 1 kit by Does not apply route 2 (two) times daily. E11.65  . colchicine 0.6 MG tablet Take 0.6 mg by mouth daily as needed (Gout).  . DUREZOL 0.05 % EMUL 1-2 drops as directed. For dry eyes  . furosemide (LASIX) 20 MG tablet Take 1 tablet (20 mg total) by mouth every other day.  . gabapentin (NEURONTIN) 300 MG capsule Take 600 mg by mouth 3 (three) times daily.  [name] glucose blood test strip Use as instructed to test blood sugars 2 times daily E11.65  . JANUVIA 100 MG tablet Take 1 tablet (100 mg total) by mouth daily.  [name] losartan (COZAAR) 50 MG tablet Take 50 mg by mouth daily.  . metFORMIN (GLUCOPHAGE) 1000 MG tablet Take 1 tablet (1,000 mg total) by mouth daily with breakfast.  . Multiple Vitamins tablet Take 1 tablet by mouth daily.  [name] omeprazole (PRILOSEC) 20 MG capsule Take 20 mg by mouth as needed.   . vitamin B-12 (CYANOCOBALAMIN) 1000 MCG tablet Take 1,000 mcg by mouth daily.     Allergies:    Morphine and related, Toradol [ketorolac tromethamine], and Codeine   Social History   Socioeconomic History  . Marital status: Divorced    Spouse name: Not on file  . Number of children: 4  . Years of education: 81  . Highest education level: Not on file  Occupational History  . Not on file  Tobacco Use  . Smoking status: Never Smoker  . Smokeless tobacco: Never Used  Vaping Use  . Vaping Use: Never used  Substance and Sexual Activity  .  Alcohol use: No    Comment: occasional wine  . Drug use: No  . Sexual activity: Not Currently  Other Topics Concern  . Not on file  Social History Narrative   Patient is single, has 3 children living 1 deceased   Patient is right handed   Education level is 12   Caffeine consumption is 0   Social Determinants of Radio broadcast assistant Strain: Not on file  Food Insecurity: Not on file  Transportation Needs: Not on file  Physical Activity: Not on file  Stress: Not on file  Social Connections: Not on file     Family History: The patient's family history includes Cancer in her sister; Heart disease in her mother.  ROS:   Please see the history of present illness.    ROS  All other systems reviewed and negative.   EKGs/Labs/Other Studies Reviewed:    The following studies were reviewed today: PAP compliance download  EKG:  EKG is not ordered today.   Recent Labs: 08/20/2019: BUN 20; Creatinine, Ser 1.23; Hemoglobin 10.7; NT-Pro BNP 189; Platelets 194; Potassium 4.0; Sodium 143; TSH 2.270 12/05/2019: ALT 31   Recent Lipid Panel    Component Value Date/Time   CHOL 99 (L) 12/05/2019 0813   TRIG 126 12/05/2019 0813   HDL 31 (L) 12/05/2019 0813   CHOLHDL 3.2 12/05/2019 0813   LDLCALC 45 12/05/2019 0813    Physical Exam:    VS:  BP (!) 130/58   Pulse 80   Ht 5' 1.5" (1.562 m)   Wt 188 lb (85.3 kg)   SpO2 94%   BMI 34.95 kg/m     Wt Readings from Last 3 Encounters:  03/18/20 188 lb (85.3 kg)  03/03/20 186 lb  (84.4 kg)  11/12/19 183 lb (83 kg)     GEN: Well nourished, well developed in no acute distress HEENT: Normal NECK: No JVD; No carotid bruits LYMPHATICS: No lymphadenopathy CARDIAC:RRR, no murmurs, rubs, gallops RESPIRATORY:  Clear to auscultation without rales, wheezing or rhonchi  ABDOMEN: Soft, non-tender, non-distended MUSCULOSKELETAL:  No edema; No deformity  SKIN: Warm and dry NEUROLOGIC:  Alert and oriented x 3 PSYCHIATRIC:  Normal affect    ASSESSMENT:    1. Essential hypertension, benign   2. Pulmonary hypertension, unspecified ([place])   3. Precordial pain   4. OSA (obstructive sleep apnea)   5. PVC's (premature ventricular contractions)    PLAN:    In order of problems listed above:  1.  HTN -BP is well controlled on exam today -continue Amlodipine 71m daily, Losartan 558mdaily and Lopressor 2513mID -SCr stable at 1.29 in Dec 2021  2.  Pulmonary HTN -echo 2019 with no pulmonary HTN - PASP 13m84mand no PHTN on echo 11/2019  3. SOB/chest pain -nuclear stress test 2018 and echo 2019 were normal -coronary CTA showed minimal CAD of the LM, mid and distal LAD -felt to have non cardiac CP -cardiac MRI due to severe BSH [place] showed no HOCM and c/w hypertensive heart disease -she still has problems with DOE and tightness in her chest when she exerts herself>>? Whether she has underlying lung disease or due to deconditioning, sedentary state and obesity -check ddimer, TSH and BNP.  CBC was normal recently -I will refer her to pulmonary for evaluation -continue ASA and statin  4.  OSA -The patient is tolerating PAP therapy well without any problems. The PAP download was reviewed today and showed an AHI of 2.9/hr on  12 cm H2O with 73% compliance in using more than 4 hours nightly.  The patient has been using and benefiting from PAP use and will continue to benefit from therapy.   5.  PVCs  -denies any palpitations -Continue BB  Followup with me in 1  year   Medication Adjustments/Labs and Tests Ordered: Current medicines are reviewed at length with the patient today.  Concerns regarding medicines are outlined above.  No orders of the defined types were placed in this encounter.  No orders of the defined types were placed in this encounter.   Signed, Fransico Him, MD  03/18/2020 2:35 PM    [place]

## 2020-03-18 NOTE — Patient Instructions (Signed)
Medication Instructions:  Your physician recommends that you continue on your current medications as directed. Please refer to the Current Medication list given to you today.  *If you need a refill on your cardiac medications before your next appointment, please call your pharmacy*   Lab Work: TODAY: BNP, TSH, D-dimer If you have labs (blood work) drawn today and your tests are completely normal, you will receive your results only by: [name] MyChart Message (if you have MyChart) OR . A paper copy in the mail If you have any lab test that is abnormal or we need to change your treatment, we will call you to review the results.  Follow-Up: At [facility], you and your health needs are our priority.  As part of our continuing mission to provide you with exceptional heart care, we have created designated Provider Care Teams.  These Care Teams include your primary Cardiologist (physician) and Advanced Practice Providers (APPs -  Physician Assistants and Nurse Practitioners) who all work together to provide you with the care you need, when you need it.  Your next appointment:   1 year(s)  The format for your next appointment:   In Person  Provider:   You may see Fransico Him, MD or one of the following Advanced Practice Providers on your designated Care Team:    Melina Copa, PA-C  Ermalinda Barrios, PA-C  Other Instructions You have been referred to see a pulmonologist - Dr. Chase Caller

## 2020-03-18 NOTE — Addendum Note (Signed)
Addended by: Antonieta Iba on: 03/18/2020 02:58 PM   Modules accepted: Orders

## 2020-03-19 ENCOUNTER — Telehealth: Payer: Self-pay

## 2020-03-19 DIAGNOSIS — R072 Precordial pain: Secondary | ICD-10-CM

## 2020-03-19 LAB — TSH: TSH: 2.26 u[IU]/mL (ref 0.450–4.500)

## 2020-03-19 LAB — PRO B NATRIURETIC PEPTIDE: NT-Pro BNP: 491 pg/mL (ref 0–738)

## 2020-03-19 NOTE — Telephone Encounter (Signed)
-----   Message from Sueanne Margarita, MD sent at 03/18/2020  7:21 PM EST ----- Ddimer elevated- please order Chest CTA to rule out PE

## 2020-03-19 NOTE — Telephone Encounter (Signed)
The patient has been notified of the result and verbalized understanding.  All questions (if any) were answered. Antonieta Iba, RN 03/19/2020 11:27 AM  CTA has been ordered.

## 2020-04-02 ENCOUNTER — Other Ambulatory Visit: Payer: Medicare Other | Admitting: *Deleted

## 2020-04-02 ENCOUNTER — Other Ambulatory Visit: Payer: Self-pay

## 2020-04-02 DIAGNOSIS — R072 Precordial pain: Secondary | ICD-10-CM

## 2020-04-03 LAB — BASIC METABOLIC PANEL
BUN/Creatinine Ratio: 15 (ref 12–28)
BUN: 17 mg/dL (ref 8–27)
CO2: 23 mmol/L (ref 20–29)
Calcium: 9.9 mg/dL (ref 8.7–10.3)
Chloride: 100 mmol/L (ref 96–106)
Creatinine, Ser: 1.13 mg/dL — ABNORMAL HIGH (ref 0.57–1.00)
Glucose: 141 mg/dL — ABNORMAL HIGH (ref 65–99)
Potassium: 4.2 mmol/L (ref 3.5–5.2)
Sodium: 138 mmol/L (ref 134–144)
eGFR: 50 mL/min/{1.73_m2} — ABNORMAL LOW (ref >59)

## 2020-04-11 ENCOUNTER — Ambulatory Visit
Admission: RE | Admit: 2020-04-11 | Discharge: 2020-04-11 | Disposition: A | Discharge status: Home / Self Care | Payer: Medicare Other | Source: Ambulatory Visit | Attending: Cardiology | Admitting: Cardiology

## 2020-04-11 DIAGNOSIS — R072 Precordial pain: Secondary | ICD-10-CM

## 2020-04-11 MED ORDER — IOPAMIDOL (ISOVUE-370) INJECTION 76%
75.0000 mL | Freq: Once | INTRAVENOUS | Status: AC | PRN
  Administered 2020-04-11: 75 mL via INTRAVENOUS

## 2020-04-14 ENCOUNTER — Encounter: Payer: Self-pay | Admitting: Cardiology

## 2020-04-14 DIAGNOSIS — I700 Atherosclerosis of aorta: Secondary | ICD-10-CM | POA: Insufficient documentation

## 2020-04-29 ENCOUNTER — Ambulatory Visit: Payer: Medicare Other | Admitting: Podiatry

## 2020-05-12 ENCOUNTER — Other Ambulatory Visit: Payer: Self-pay

## 2020-05-12 ENCOUNTER — Ambulatory Visit (INDEPENDENT_AMBULATORY_CARE_PROVIDER_SITE_OTHER): Payer: Medicare Other | Admitting: Pulmonary Disease

## 2020-05-12 ENCOUNTER — Encounter: Payer: Self-pay | Admitting: Pulmonary Disease

## 2020-05-12 VITALS — BP 132/64 | HR 75 | Temp 98.1°F | Ht 61.5 in | Wt 184.6 lb

## 2020-05-12 DIAGNOSIS — R0609 Other forms of dyspnea: Secondary | ICD-10-CM

## 2020-05-12 DIAGNOSIS — R0600 Dyspnea, unspecified: Secondary | ICD-10-CM

## 2020-05-12 NOTE — Patient Instructions (Signed)
Nice to meet you!  We will get PFT or breathing tests in the coming days or weeks  Return to clinic in 3 months with Dr Silas Flood for follow up

## 2020-05-12 NOTE — Progress Notes (Signed)
@Patient  ID: Nancy [name], female    DOB: 12-24-1940, 80 y.o.   MRN: [id number]  Chief Complaint  Patient presents with  . Consult    Referring provider: Sueanne Margarita, MD  HPI:   35 whom we are seeing in consultation for evaluation of dyspnea on exertion.  PCP note reviewed.  Most recent note from referring provider reviewed.  Patient's chief complaint in her own words is fatigue.  Hard for her to truly describe dyspnea.  With walking relatively short distances she feels worn out tired and wants to rest.  Symptoms seem to get better with resting.  No shortness of breath at rest.  No timing during the day when things are better or worse.  No environmental changes that may account for the symptoms.  No seasonal changes to symptoms.  Symptoms present for many months.  No other alleviating or exacerbating factors.  She does have OSA.  Reports good adherence to CPAP.  She does not sleep much.  Often going to bed at 2 or 3:00 AM and waking up early in the morning.  Sometimes naps later in the morning.  Does not easily doze off throughout the day.  Recent CTA chest 04/11/2020 reviewed interpreted as clear lungs, very mild bibasilar bronchiectasis on my interpretation.  Most recent TTE 11/21 reviewed with normal EF, indeterminate diastolic function, normal RV function, normal LA size.  PMH: OSA: Hypertension: Diabetes Surgical history: Spinal fusion, hysterectomy, appendectomy, lumpectomy, Family history: CAD in mother, sister with ovarian cancer Social history: She is a never smoker, lives in [place] / Pulmonary Flowsheets:   ACT:  No flowsheet data found.  MMRC: No flowsheet data found.  Epworth:  Results of the Epworth flowsheet 07/29/2017  Sitting and reading 2  Watching TV 2  Sitting, inactive in a public place (e.g. a theatre or a meeting) 0  As a passenger in a car for an hour without a break 2  Lying down to rest in the afternoon when circumstances  permit 2  Sitting and talking to someone 0  Sitting quietly after a lunch without alcohol 2  In a car, while stopped for a few minutes in traffic 0  Total score 10    Tests:   FENO:  No results found for: NITRICOXIDE  PFT: No flowsheet data found.  WALK:  No flowsheet data found.  Imaging: Personally reviewed and as per EMR discussion of this note  Lab Results: Personally reviewed CBC    Component Value Date/Time   WBC 6.4 08/20/2019 1622   WBC 11.8 (H) 03/16/2016 0420   RBC 3.76 (L) 08/20/2019 1622   RBC 3.58 (L) 03/16/2016 0420   HGB 10.7 (L) 08/20/2019 1622   HCT 32.5 (L) 08/20/2019 1622   PLT 194 08/20/2019 1622   MCV 86 08/20/2019 1622   MCH 28.5 08/20/2019 1622   MCH 26.3 03/16/2016 0420   MCHC 32.9 08/20/2019 1622   MCHC 33.5 03/16/2016 0420   RDW 14.3 08/20/2019 1622   LYMPHSABS 0.7 03/16/2016 0420   MONOABS 0.5 03/16/2016 0420   EOSABS 0.0 03/16/2016 0420   BASOSABS 0.0 03/16/2016 0420    BMET    Component Value Date/Time   NA 138 04/02/2020 1306   K 4.2 04/02/2020 1306   CL 100 04/02/2020 1306   CO2 23 04/02/2020 1306   GLUCOSE 141 (H) 04/02/2020 1306   GLUCOSE 175 (H) 05/31/2018 1053   BUN 17 04/02/2020 1306   CREATININE 1.13 (H) 04/02/2020  1306   CALCIUM 9.9 04/02/2020 1306   GFRNONAA 42 (L) 08/20/2019 1622   GFRAA 49 (L) 08/20/2019 1622    BNP No results found for: BNP  ProBNP    Component Value Date/Time   PROBNP 491 03/18/2020 1505   PROBNP 250.6 (H) 07/09/2011 1329    Specialty Problems      Pulmonary Problems   Pulmonary hyperinflation   SOB (shortness of breath)      Allergies  Allergen Reactions  . Morphine And Related Swelling    10/02/15 - given Toradol + morphine and RN noted tongue swelling - given Epi + Benadryl Has had morphine in past w/o problem  . Toradol [Ketorolac Tromethamine] Swelling    10/02/15 - given Toradol + morphine and RN noted tongue swelling - given Epi + Benadryl Has taken ibuprofen in past  w/o problem  . Codeine Other (See Comments)    headache    Immunization History  Administered Date(s) Administered  . Influenza-Unspecified 11/26/2015  . PFIZER(Purple Top)SARS-COV-2 Vaccination 01/26/2019, 02/16/2019, 10/24/2019    Past Medical History:  Diagnosis Date  . Allergy   . Anemia   . Aortic atherosclerosis ([place])   . Breast cyst    left breast  . CAD (coronary artery disease), native coronary artery    minimal ASCAD to the distal LM and mid LAD with 0-24% stenosis and Calcium score of 26.  . Chest pain   . CKD (chronic kidney disease), stage III ([place])   . Complication of anesthesia 1986   slow to awaken after cholecystectomy  . Diastolic dysfunction    with mild pulmonary HTN by echo 07/2011  . Gastric ulcer   . GERD (gastroesophageal reflux disease)   . Gout   . Hyperlipidemia   . Hypertension   . 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 Osteoarthritis   . Osteopenia   . Shortness of breath dyspnea    with activity  . Sinus pause   . Spine deformity    lower spine  . Type II diabetes mellitus ([facility])     Tobacco History: Social History   Tobacco Use  Smoking Status Never Smoker  Smokeless Tobacco Never Used   Counseling given: Not Answered   Continue to not smoke  Outpatient Encounter Medications as of 05/12/2020  Medication Sig  . allopurinol (ZYLOPRIM) 300 MG tablet Take 300 mg by mouth daily.   [name] amLODipine (NORVASC) 5 MG tablet Take 5 mg by mouth daily.  [name] aspirin EC 81 MG tablet Take 81 mg by mouth daily.  [name] atorvastatin (LIPITOR) 10 MG tablet Take 1 tablet by mouth once daily  . Blood Glucose Monitoring Suppl (ONETOUCH VERIO IQ SYSTEM) w/Device KIT 1 kit by Does not apply route 2 (two) times daily. E11.65  . colchicine 0.6 MG tablet Take 0.6 mg by mouth daily as needed (Gout).  . DUREZOL 0.05 % EMUL 1-2 drops as directed. For dry eyes  . furosemide (LASIX) 20 MG tablet Take 1 tablet (20 mg total) by mouth every other day.  . gabapentin  (NEURONTIN) 300 MG capsule Take 600 mg by mouth 3 (three) times daily.  [name] glucose blood test strip Use as instructed to test blood sugars 2 times daily E11.65  . JANUVIA 100 MG tablet Take 1 tablet (100 mg total) by mouth daily.  [name] losartan (COZAAR) 50 MG tablet Take 50 mg by mouth daily.  . metFORMIN (GLUCOPHAGE) 1000 MG tablet Take 1 tablet (1,000 mg total) by mouth  daily with breakfast.  . Multiple Vitamins tablet Take 1 tablet by mouth daily.  [name] omeprazole (PRILOSEC) 20 MG capsule Take 20 mg by mouth as needed.   . vitamin B-12 (CYANOCOBALAMIN) 1000 MCG tablet Take 1,000 mcg by mouth daily.  . metoprolol tartrate (LOPRESSOR) 50 MG tablet Take 0.5 tablets (25 mg total) by mouth 2 (two) times daily.   No facility-administered encounter medications on file as of 05/12/2020.     Review of Systems  Review of Systems  No chest pain with exertion, no orthopnea or PND.  Comprehensive review of systems otherwise negative. Physical Exam  BP 132/64 (BP Location: Left Arm, Cuff Size: Normal)   Pulse 75   Temp 98.1 F (36.7 C) (Oral)   Ht 5' 1.5" (1.562 m)   Wt 184 lb 9.6 oz (83.7 kg)   SpO2 98%   BMI 34.32 kg/m   Wt Readings from Last 5 Encounters:  05/12/20 184 lb 9.6 oz (83.7 kg)  03/18/20 188 lb (85.3 kg)  03/03/20 186 lb (84.4 kg)  11/12/19 183 lb (83 kg)  08/20/19 187 lb 9.6 oz (85.1 kg)    BMI Readings from Last 5 Encounters:  05/12/20 34.32 kg/m  03/18/20 34.95 kg/m  03/03/20 34.02 kg/m  11/12/19 33.47 kg/m  08/20/19 34.31 kg/m     Physical Exam General: Well-appearing, no acute distress Eyes: EOMI, no icterus Neck: Supple no JVP Pulmonary: Clear to auscultation bilaterally, no wheezing Cardiovascular: Regular rhythm, no murmur Abdomen: Nondistended, bowel sounds present MSK: No synovitis, joint effusion Neuro: Normal gait, no weakness Psych: Normal mood, full affect   Assessment & Plan:   DOE: Unclear if true dyspnea or patient is just fatigued.  She  states [name] fatigued and does not complain as much of dyspnea.  We will obtain PFTs in the coming days for further evaluation.  Notably no atopic symptoms to suggest asthma.  Never smoker.  Suspect most likely due to deconditioning, obesity.  Fatigue: When pushed this is her true chief complaint.  We will get PFTs to evaluate if there is a respiratory component.  In addition, Recommended she discuss her complaints of fatigue with her PCP to see if there is further work-up or evaluation they recommend.  Notably, has had mild anemia in the past.  OSA: Reports good adherence to CPAP, recommended ongoing follow-up with her sleep physician.   Return in about 3 months (around 08/12/2020).   Lanier Clam, MD 05/12/2020

## 2020-05-15 ENCOUNTER — Ambulatory Visit: Payer: Medicare Other | Admitting: Podiatry

## 2020-05-26 ENCOUNTER — Other Ambulatory Visit (HOSPITAL_COMMUNITY)
Admission: RE | Admit: 2020-05-26 | Discharge: 2020-05-26 | Disposition: A | Discharge status: Home / Self Care | Payer: Medicare Other | Source: Ambulatory Visit | Attending: Pulmonary Disease | Admitting: Pulmonary Disease

## 2020-05-26 DIAGNOSIS — Z20822 Contact with and (suspected) exposure to covid-19: Secondary | ICD-10-CM | POA: Insufficient documentation

## 2020-05-26 DIAGNOSIS — Z01812 Encounter for preprocedural laboratory examination: Secondary | ICD-10-CM | POA: Insufficient documentation

## 2020-05-27 LAB — SARS CORONAVIRUS 2 (TAT 6-24 HRS): SARS Coronavirus 2: NEGATIVE

## 2020-05-28 ENCOUNTER — Other Ambulatory Visit: Payer: Self-pay

## 2020-05-28 ENCOUNTER — Ambulatory Visit (INDEPENDENT_AMBULATORY_CARE_PROVIDER_SITE_OTHER): Payer: Medicare Other | Admitting: Pulmonary Disease

## 2020-05-28 DIAGNOSIS — R0600 Dyspnea, unspecified: Secondary | ICD-10-CM

## 2020-05-28 DIAGNOSIS — R0609 Other forms of dyspnea: Secondary | ICD-10-CM

## 2020-05-28 LAB — PULMONARY FUNCTION TEST
DL/VA % pred: 99 %
DL/VA: 4.15 ml/min/mmHg/L
DLCO unc % pred: 82 %
DLCO unc: 14.09 ml/min/mmHg
FEF 25-75 Post: 1.88 L/sec
FEF 25-75 Pre: 1.73 L/sec
FEF2575-%Change-Post: 8 %
FEF2575-%Pred-Post: 159 %
FEF2575-%Pred-Pre: 146 %
FEV1-%Change-Post: 0 %
FEV1-%Pred-Post: 108 %
FEV1-%Pred-Pre: 108 %
FEV1-Post: 1.47 L
FEV1-Pre: 1.48 L
FEV1FVC-%Change-Post: 4 %
FEV1FVC-%Pred-Pre: 110 %
FEV6-%Change-Post: -3 %
FEV6-%Pred-Post: 100 %
FEV6-%Pred-Pre: 104 %
FEV6-Post: 1.68 L
FEV6-Pre: 1.75 L
FEV6FVC-%Change-Post: 0 %
FEV6FVC-%Pred-Post: 105 %
FEV6FVC-%Pred-Pre: 104 %
FVC-%Change-Post: -4 %
FVC-%Pred-Post: 95 %
FVC-%Pred-Pre: 99 %
FVC-Post: 1.68 L
FVC-Pre: 1.76 L
Post FEV1/FVC ratio: 87 %
Post FEV6/FVC ratio: 100 %
Pre FEV1/FVC ratio: 84 %
Pre FEV6/FVC Ratio: 99 %
RV % pred: 65 %
RV: 1.45 L
TLC % pred: 73 %
TLC: 3.39 L

## 2020-05-28 NOTE — Progress Notes (Signed)
PFT done today. 

## 2020-06-02 ENCOUNTER — Ambulatory Visit (INDEPENDENT_AMBULATORY_CARE_PROVIDER_SITE_OTHER): Payer: Medicare Other | Admitting: Podiatry

## 2020-06-02 ENCOUNTER — Other Ambulatory Visit: Payer: Self-pay

## 2020-06-02 DIAGNOSIS — B351 Tinea unguium: Secondary | ICD-10-CM | POA: Diagnosis not present

## 2020-06-02 DIAGNOSIS — E1149 Type 2 diabetes mellitus with other diabetic neurological complication: Secondary | ICD-10-CM

## 2020-06-02 DIAGNOSIS — M79674 Pain in right toe(s): Secondary | ICD-10-CM

## 2020-06-02 DIAGNOSIS — M79675 Pain in left toe(s): Secondary | ICD-10-CM | POA: Diagnosis not present

## 2020-06-04 NOTE — Progress Notes (Signed)
Subjective: 80 y.o. returns the office today for painful, elongated, thickened toenails which she cannot trim herself. Denies any redness or drainage around the nails.  [place] neuropathy symptoms but states that she does not take the gabapentin as she should regularly.  Denies any systemic complaints such as fevers, chills, nausea, vomiting.   Objective: AAO 3, NAD DP/PT pulses palpable, CRT less than 3 seconds Sensation decreased with SWMF.  Nails hypertrophic, dystrophic, elongated, brittle, discolored 10. There is tenderness overlying the nails 1-5 bilaterally. There is no surrounding erythema or drainage along the nail sites. On the plantar aspect left foot there is a new hyperpigmented lesion although flat.  No change or pus or any pain.  No change compared to last appointment No pain with calf compression, swelling, warmth, erythema.  Assessment: Patient presents with symptomatic onychomycosis; neuropathy; hyperpigmented skin lesion  Plan: -Treatment options including alternatives, risks, complications were discussed -Nails sharply debrided 10 without complication/bleeding.   -Follow-up with dermatology for the new hyperpigmented lesion on the plantar foot. -Continue gabapentin.  Return in about 9 weeks   Trula Slade DPM

## 2020-07-22 ENCOUNTER — Encounter: Payer: Self-pay | Admitting: Surgical

## 2020-07-22 ENCOUNTER — Ambulatory Visit (INDEPENDENT_AMBULATORY_CARE_PROVIDER_SITE_OTHER): Payer: Medicare Other | Admitting: Surgical

## 2020-07-22 ENCOUNTER — Other Ambulatory Visit: Payer: Self-pay

## 2020-07-22 DIAGNOSIS — M1712 Unilateral primary osteoarthritis, left knee: Secondary | ICD-10-CM | POA: Diagnosis not present

## 2020-07-22 DIAGNOSIS — M170 Bilateral primary osteoarthritis of knee: Secondary | ICD-10-CM

## 2020-07-22 DIAGNOSIS — M1711 Unilateral primary osteoarthritis, right knee: Secondary | ICD-10-CM

## 2020-07-22 MED ORDER — BUPIVACAINE HCL 0.25 % IJ SOLN
4.0000 mL | INTRAMUSCULAR | Status: AC | PRN
  Administered 2020-07-22: 4 mL via INTRA_ARTICULAR

## 2020-07-22 MED ORDER — BUPIVACAINE HCL 0.25 % IJ SOLN
4.0000 mL | INTRAMUSCULAR | Status: AC | PRN
Start: 2020-07-22 — End: 2020-07-22
  Administered 2020-07-22: 4 mL via INTRA_ARTICULAR

## 2020-07-22 MED ORDER — LIDOCAINE HCL 1 % IJ SOLN
5.0000 mL | INTRAMUSCULAR | Status: AC | PRN
  Administered 2020-07-22: 5 mL

## 2020-07-22 MED ORDER — METHYLPREDNISOLONE ACETATE 40 MG/ML IJ SUSP
40.0000 mg | INTRAMUSCULAR | Status: AC | PRN
Start: 2020-07-22 — End: 2020-07-22
  Administered 2020-07-22: 40 mg via INTRA_ARTICULAR

## 2020-07-22 NOTE — Progress Notes (Signed)
Office Visit Note   Patient: Nancy [name]           Date of Birth: Jun 29, 1940           MRN: [id number] Visit Date: 07/22/2020 Requested by: Buzzy Han, MD [place],  [place] [address] PCP: Buzzy Han, MD  Subjective: Chief Complaint  Patient presents with   Right Knee - Pain   Left Knee - Pain    HPI: Nancy [name] is a 80 y.o. female who presents to the office complaining of bilateral knee pain.  Patient has a history of bilateral knee osteoarthritis.  Last injections were on 06/21/2019 and these injections lasted for about 12 months until they started to wear off last month.  She reports pain mostly in the anterior and medial aspects of both knees with the left knee feeling a little worse than the right knee most of the time.  Denies any radicular pain down the leg, groin pain.  She does have history of diabetes with neuropathy and last A1c was 6.1.  She uses hemp oil and Tiger balm with some relief as well as taking gabapentin for her neuropathy which helps with the knee pain as well.  No other medications that she takes for her knees.  She does not want any sort of knee surgery and wants to hold off on that as long as she can..                ROS: All systems reviewed are negative as they relate to the chief complaint within the history of present illness.  Patient denies fevers or chills.  Assessment & Plan: Visit Diagnoses:  1. Primary osteoarthritis of both knees     Plan: Patient is a 80 year old female who presents complaint of bilateral knee pain.  She has history of bilateral knee osteoarthritis.  She has received cortisone injections in the past that lasted for about a year.  She would like to repeat these injections today.  Last A1c was 6.1.  Injection successfully administered and patient tolerated the procedure well.  Follow-up as needed.  Follow-Up Instructions: No follow-ups on file.   Orders:  No orders of the  defined types were placed in this encounter.  No orders of the defined types were placed in this encounter.     Procedures: Large Joint Inj: bilateral knee on 07/22/2020 4:38 PM Indications: diagnostic evaluation, joint swelling and pain Details: 18 G 1.5 in needle, superolateral approach  Arthrogram: No  Medications (Right): 5 mL lidocaine 1 %; 4 mL bupivacaine 0.25 %; 40 mg methylPREDNISolone acetate 40 MG/ML Medications (Left): 5 mL lidocaine 1 %; 4 mL bupivacaine 0.25 %; 40 mg methylPREDNISolone acetate 40 MG/ML Outcome: tolerated well, no immediate complications Procedure, treatment alternatives, risks and benefits explained, specific risks discussed. Consent was given by the patient. Immediately prior to procedure a time out was called to verify the correct patient, procedure, equipment, support staff and site/side marked as required. Patient was prepped and draped in the usual sterile fashion.      Clinical Data: No additional findings.  Objective: Vital Signs: There were no vitals taken for this visit.  Physical Exam:  Constitutional: Patient appears well-developed HEENT:  Head: Normocephalic Eyes:EOM are normal Neck: Normal range of motion Cardiovascular: Normal rate Pulmonary/chest: Effort normal Neurologic: Patient is alert Skin: Skin is warm Psychiatric: Patient has normal mood and affect  Ortho Exam: Ortho exam demonstrates bilateral knees with no effusion.  Left knee with 5  degrees extension and 105 degrees of knee flexion.  Right knee with 3 degrees of extension and 115 degrees of knee flexion.  Able to perform straight leg raise with both knees without extensor lag.  Tenderness over the medial and lateral joint line, mostly over the medial joint line bilaterally.  No pain with hip range of motion bilaterally.  Negative Homans' sign, no calf tenderness bilaterally.  No laxity with anterior/posterior drawer or varus/valgus stress bilaterally.  Specialty Comments:   No specialty comments available.  Imaging: No results found.   PMFS History: Patient Active Problem List   Diagnosis Date Noted   Aortic atherosclerosis ([place])    Type 2 diabetes mellitus with stage 3b chronic kidney disease, without long-term current use of insulin ([place]) 03/03/2020   Polyneuropathy associated with underlying disease ([place]) 03/03/2020   CAD (coronary artery disease), native coronary artery    Pulmonary hypertension, unspecified ([place]) 07/29/2017   Chest pain 10/07/2016   SOB (shortness of breath) 10/07/2016   Pseudogout of bilateral knees 03/16/2016   Osteoarthritis 03/16/2016   SIRS (systemic inflammatory response syndrome) ([place]) 03/14/2016   Elevated sed rate 03/14/2016   Primary osteoarthritis of both knees 01/01/2016   Postoperative wound infection 12/18/2015   Difficulty in walking, not elsewhere classified    Status post lumbar spinal fusion    Type 2 diabetes mellitus with diabetic polyneuropathy, without long-term current use of insulin ([place])    Infection due to ESBL-producing Escherichia coli    Angioedema    Hyperlipidemia 10/01/2015   Gait instability 02/19/2013   Pulmonary hyperinflation 13/08/6576   Diastolic dysfunction    Essential hypertension, benign 01/03/2013   Past Medical History:  Diagnosis Date   Allergy    Anemia    Aortic atherosclerosis ([place])    Breast cyst    left breast   CAD (coronary artery disease), native coronary artery    minimal ASCAD to the distal LM and mid LAD with 0-24% stenosis and Calcium score of 26.   Chest pain    CKD (chronic kidney disease), stage III (HCC)    Complication of anesthesia 1986   slow to awaken after cholecystectomy   Diastolic dysfunction    with mild pulmonary HTN by echo 07/2011   Gastric ulcer    GERD (gastroesophageal reflux disease)    Gout    Hyperlipidemia    Hypertension    Neuropathy    from diabetes per patient   Osteoarthritis    Osteopenia    Shortness of breath dyspnea     with activity   Sinus pause    Spine deformity    lower spine   Type II diabetes mellitus ([place])     Family History  Problem Relation Age of Onset   Heart disease Mother    Cancer Sister        ovarian    Past Surgical History:  Procedure Laterality Date   ABDOMINAL ADHESION SURGERY     ABDOMINAL HYSTERECTOMY     ANUS SURGERY     for torn tissue   APPENDECTOMY     BREAST BIOPSY     BREAST LUMPECTOMY     left   CHOLECYSTECTOMY  1986   COLONOSCOPY     ESOPHAGOGASTRODUODENOSCOPY (EGD) WITH PROPOFOL N/A 07/18/2013   Procedure: ESOPHAGOGASTRODUODENOSCOPY (EGD) WITH PROPOFOL;  Surgeon: Arta Silence, MD;  [facility];  Service: Endoscopy;  Laterality: N/A;   EUS N/A 07/18/2013   Procedure: ESOPHAGEAL ENDOSCOPIC ULTRASOUND (EUS) RADIAL;  Surgeon: Arta Silence, MD;  [facility];  Service: Endoscopy;  Laterality: N/A;   IR GENERIC HISTORICAL  10/30/2015   IR US GUIDE VASC ACCESS RIGHT 10/30/2015 Saverio Danker, PA-C [facility]   IR GENERIC HISTORICAL  10/30/2015   IR FLUORO GUIDE CV LINE RIGHT 10/30/2015 Saverio Danker, PA-C [facility]   LUMBAR WOUND DEBRIDEMENT N/A 10/01/2015   Procedure: LUMBAR WOUND DEBRIDEMENT;  Surgeon: Melina Schools, MD;  Location: [facility];  Service: Orthopedics;  Laterality: N/A;   LUMBAR WOUND DEBRIDEMENT N/A 10/15/2015   Procedure: [facility] OUT [facility] CLOSURE OF BACK WOUND;  Surgeon: Melina Schools, MD;  Location: [facility];  Service: Orthopedics;  Laterality: N/A;   MASS EXCISION Left 07/24/2015   Procedure: EXCISION MASS LEFT LONG FINGER;  Surgeon: Leanora Cover, MD;  Location: [facility];  Service: Orthopedics;  Laterality: Left;   SPINAL FUSION N/A 09/17/2015   Procedure: FUSION POSTERIOR SPINAL MULTILEVEL L4- S1;  Surgeon: Melina Schools, MD;  Location: [facility];  Service: Orthopedics;  Laterality: N/A;   TRANSFORAMINAL LUMBAR INTERBODY FUSION (TLIF) WITH PEDICLE SCREW FIXATION 1 LEVEL N/A 09/17/2015   Procedure: TRANSFORAMINAL LUMBAR  INTERBODY FUSION (TLIF) WITH PEDICLE SCREW FIXATION 1 LEVEL , Lumbar 4-5;  Surgeon: Melina Schools, MD;  Location: [facility];  Service: Orthopedics;  Laterality: N/A;   Social History   Occupational History   Not on file  Tobacco Use   Smoking status: Never   Smokeless tobacco: Never  Vaping Use   Vaping Use: Never used  Substance and Sexual Activity   Alcohol use: No    Comment: occasional wine   Drug use: No   Sexual activity: Not Currently

## 2020-07-28 ENCOUNTER — Other Ambulatory Visit: Payer: Self-pay

## 2020-07-28 ENCOUNTER — Ambulatory Visit (INDEPENDENT_AMBULATORY_CARE_PROVIDER_SITE_OTHER): Payer: Medicare Other | Admitting: Dermatology

## 2020-07-28 ENCOUNTER — Encounter: Payer: Self-pay | Admitting: Dermatology

## 2020-07-28 DIAGNOSIS — D2272 Melanocytic nevi of left lower limb, including hip: Secondary | ICD-10-CM | POA: Diagnosis not present

## 2020-07-28 DIAGNOSIS — D485 Neoplasm of uncertain behavior of skin: Secondary | ICD-10-CM

## 2020-07-28 MED ORDER — MUPIROCIN 2 % EX OINT: 1.0000 "application " | TOPICAL_OINTMENT | Freq: Every day | CUTANEOUS | 0 refills | Status: DC

## 2020-07-28 NOTE — Patient Instructions (Signed)

## 2020-07-31 ENCOUNTER — Telehealth: Payer: Self-pay | Admitting: Cardiology

## 2020-07-31 ENCOUNTER — Other Ambulatory Visit: Payer: Self-pay | Admitting: Cardiology

## 2020-07-31 NOTE — Telephone Encounter (Signed)
Refill for Atorvastatin has been sent to [facility].

## 2020-07-31 NOTE — Telephone Encounter (Signed)
   *  STAT* If patient is at the pharmacy, call can be transferred to refill team.   1. Which medications need to be refilled? (please list name of each medication and dose if known)   atorvastatin (LIPITOR) 10 MG tablet    2. Which pharmacy/location (including street and city if local pharmacy) is medication to be sent to? [place] - [address]  3. Do they need a 30 day or 90 day supply? 90 days

## 2020-08-04 ENCOUNTER — Encounter: Payer: Self-pay | Admitting: Dermatology

## 2020-08-04 NOTE — Progress Notes (Signed)
   New Patient   Subjective  Nancy [name] is a 80 y.o. female who presents for the following: New Patient (Initial Visit) (Patient here today for a lesion on her left foot x few months. Per patient Dr. Earleen Newport noticed the lesion and wanted patient to have it checked because it was new to Dr. Earleen Newport. Per patient no bleeding, no pain. No personal history or family history of atypical moles, melanoma or non mole skin cancer. ).  Dark spot to the bottom of left foot, possibly new Location:  Duration:  Quality:  Associated Signs/Symptoms: Modifying Factors:  Severity:  Timing: Context:    The following portions of the chart were reviewed this encounter and updated as appropriate:      Objective  Well appearing patient in no apparent distress; mood and affect are within normal limits. Left Medial Plantar Surface 6 mm irregular bi-chromic macule; dermoscopy does not show parallel ridge pattern but does show some irregularity to pigmentation and shape so biopsy indicated to exclude acral lentiginous melanoma.         A focused examination was performed including hands, feet.. Relevant physical exam findings are noted in the Assessment and Plan.   Assessment & Plan  Neoplasm of uncertain behavior of skin Left Medial Plantar Surface  Skin / nail biopsy Type of biopsy: tangential   Informed consent: discussed and consent obtained   Timeout: patient name, date of birth, surgical site, and procedure verified   Anesthesia: the lesion was anesthetized in a standard fashion   Anesthetic:  1% lidocaine w/ epinephrine 1-100,000 local infiltration Instrument used: flexible razor blade   Hemostasis achieved with: ferric subsulfate and electrodesiccation   Outcome: patient tolerated procedure well   Post-procedure details: sterile dressing applied and wound care instructions given   Dressing type: bandage and petrolatum    mupirocin ointment (BACTROBAN) 2 % Apply 1 application  topically daily.  Specimen 1 - Surgical pathology Differential Diagnosis: R/O Atypia  Check Margins: No

## 2020-08-06 ENCOUNTER — Telehealth: Payer: Self-pay

## 2020-08-06 NOTE — Telephone Encounter (Signed)
Path to patient no further work needs to be done

## 2020-08-06 NOTE — Telephone Encounter (Signed)
-----   Message from Lavonna Monarch, MD sent at 08/06/2020  5:03 AM EDT ----- Please inform patient that Dr. Darene Lamer was pleased that the biopsy showed a benign type of mole.  There may be some residual pigment after the spot heals, but as long as this is stable no further procedure may be done. ----- Message ----- From: Interface, Lab In [facility] [place] [facility] Sent: 08/05/2020   8:31 PM EDT To: Lavonna Monarch, MD

## 2020-09-01 ENCOUNTER — Ambulatory Visit (INDEPENDENT_AMBULATORY_CARE_PROVIDER_SITE_OTHER): Payer: Medicare Other | Admitting: Podiatry

## 2020-09-01 ENCOUNTER — Encounter: Payer: Self-pay | Admitting: Podiatry

## 2020-09-01 ENCOUNTER — Other Ambulatory Visit: Payer: Self-pay

## 2020-09-01 DIAGNOSIS — Q828 Other specified congenital malformations of skin: Secondary | ICD-10-CM

## 2020-09-01 DIAGNOSIS — M79675 Pain in left toe(s): Secondary | ICD-10-CM

## 2020-09-01 DIAGNOSIS — G629 Polyneuropathy, unspecified: Secondary | ICD-10-CM

## 2020-09-01 DIAGNOSIS — B351 Tinea unguium: Secondary | ICD-10-CM

## 2020-09-01 DIAGNOSIS — E1149 Type 2 diabetes mellitus with other diabetic neurological complication: Secondary | ICD-10-CM | POA: Diagnosis not present

## 2020-09-01 DIAGNOSIS — M79674 Pain in right toe(s): Secondary | ICD-10-CM | POA: Diagnosis not present

## 2020-09-02 NOTE — Progress Notes (Signed)
Subjective: 80 y.o. returns the office today for painful, elongated, thickened toenails which she cannot trim herself. Denies any redness or drainage around the nails.  She did follow up with dermatology for the skin lesions and states it was nothing to worry about. Denies any systemic complaints such as fevers, chills, nausea, vomiting.   Objective: AAO 3, NAD DP/PT pulses palpable, CRT less than 3 seconds Sensation decreased with SWMF.  Nails hypertrophic, dystrophic, elongated, brittle, discolored 10. There is tenderness overlying the nails 1-5 bilaterally. There is no surrounding erythema or drainage along the nail sites. Minimal callus on the plantar aspect of the left foot. No underlying ulceration, drainage, or signs of infection.  No pain with calf compression, swelling, warmth, erythema.  Assessment: Patient presents with symptomatic onychomycosis; neuropathy; hyperpigmented skin lesion  Plan: -Treatment options including alternatives, risks, complications were discussed -Nails sharply debrided 10 without complication/bleeding.   -Sharply debrided the hyperkeratotic lesion x 1 on the left foot without any complications or bleeding.  -Continue gabapentin.  Return in about 9 weeks   Trula Slade DPM

## 2020-09-03 ENCOUNTER — Ambulatory Visit: Payer: Medicare Other | Admitting: Internal Medicine

## 2020-09-24 ENCOUNTER — Other Ambulatory Visit: Payer: Self-pay

## 2020-09-24 ENCOUNTER — Encounter: Payer: Self-pay | Admitting: Internal Medicine

## 2020-09-24 ENCOUNTER — Ambulatory Visit (INDEPENDENT_AMBULATORY_CARE_PROVIDER_SITE_OTHER): Payer: Medicare Other | Admitting: Internal Medicine

## 2020-09-24 VITALS — BP 126/82 | HR 79 | Ht 61.5 in | Wt 183.4 lb

## 2020-09-24 DIAGNOSIS — E1165 Type 2 diabetes mellitus with hyperglycemia: Secondary | ICD-10-CM | POA: Diagnosis not present

## 2020-09-24 DIAGNOSIS — E1122 Type 2 diabetes mellitus with diabetic chronic kidney disease: Secondary | ICD-10-CM | POA: Diagnosis not present

## 2020-09-24 DIAGNOSIS — N1832 Chronic kidney disease, stage 3b: Secondary | ICD-10-CM | POA: Diagnosis not present

## 2020-09-24 DIAGNOSIS — E1142 Type 2 diabetes mellitus with diabetic polyneuropathy: Secondary | ICD-10-CM

## 2020-09-24 LAB — POCT GLYCOSYLATED HEMOGLOBIN (HGB A1C): Hemoglobin A1C: 7.1 % — AB (ref 4.0–5.6)

## 2020-09-24 LAB — GLUCOSE, POCT (MANUAL RESULT ENTRY): POC Glucose: 355 mg/dl — AB (ref 70–99)

## 2020-09-24 MED ORDER — DAPAGLIFLOZIN PROPANEDIOL 5 MG PO TABS: 5.0000 mg | ORAL_TABLET | Freq: Every day | ORAL | 6 refills | Status: DC

## 2020-09-24 NOTE — Progress Notes (Signed)
`     Name: Nancy [name]  Age/ Sex: 80 y.o., female   MRN/ DOB: [id number], 09/29/1940     PCP: Buzzy Han, MD   Reason for Endocrinology Evaluation: Type 2 Diabetes Mellitus  Initial Endocrine Consultative Visit: 10/31/2017    PATIENT IDENTIFIER: [name] is a 80 y.o. female with a past medical history of HTN, Gout, Hyperlipidemia, and T2 DM . The patient has followed with Endocrinology clinic since 10/31/17 for consultative assistance with management of her diabetes.  DIABETIC HISTORY:  Nancy [name] was diagnosed with T2DM ~ in 2009, she has been on oral glycemic agents since her diagnosis. Has not been on insulin prior to her presentation here. Her hemoglobin A1c has ranged from 7.3% in 09/2015, peaking at 8.6% in 08/2015.  Glipizide stopped 06/2019 with an A1c of 6.0 %    SUBJECTIVE:   During the last visit (07/06/2019): Her A1c was 6. 0%.  continued  Metformin, and  Januvia and stopped  Glipizide .    Today (09/24/2020): Nancy [name] is here for a 6 month follow up on diabetes management. She checks her blood sugars occasionally.The patient has not had had hypoglycemic episodes since the last clinic visit.  She saw her ophthalmologist in 08/2020 as she was having double vision occasionally Her In-office BG 355 mg/dL she had sweet tea and two hot dogs and sweet tea    She had skin flushing after trying medication or a vitamin   She continues to have chronic feet pains   HOME DIABETES REGIMEN:  Metfomrin 1000 mg daily  Januvia 100 mg daily    METER DOWNLOAD SUMMARY: did not bring    DIABETIC COMPLICATIONS: Microvascular complications:  Neuropathy Denies: retinopathy, nephropathy Last eye exam: Completed 08/2020     Macrovascular complications:    Denies: CAD, PVD, CVA    HISTORY:  Past Medical History:  Past Medical History:  Diagnosis Date   Allergy    Anemia    Aortic atherosclerosis (HCC)    Breast cyst    left breast    CAD (coronary artery disease), native coronary artery    minimal ASCAD to the distal LM and mid LAD with 0-24% stenosis and Calcium score of 26.   Chest pain    CKD (chronic kidney disease), stage III (HCC)    Complication of anesthesia 1986   slow to awaken after cholecystectomy   Diastolic dysfunction    with mild pulmonary HTN by echo 07/2011   Gastric ulcer    GERD (gastroesophageal reflux disease)    Gout    Hyperlipidemia    Hypertension    Neuropathy    from diabetes per patient   Osteoarthritis    Osteopenia    Shortness of breath dyspnea    with activity   Sinus pause    Spine deformity    lower spine   Type II diabetes mellitus ([place])    Past Surgical History:  Past Surgical History:  Procedure Laterality Date   ABDOMINAL ADHESION SURGERY     ABDOMINAL HYSTERECTOMY     ANUS SURGERY     for torn tissue   APPENDECTOMY     BREAST BIOPSY     BREAST LUMPECTOMY     left   CHOLECYSTECTOMY  1986   COLONOSCOPY     ESOPHAGOGASTRODUODENOSCOPY (EGD) WITH PROPOFOL N/A 07/18/2013   Procedure: ESOPHAGOGASTRODUODENOSCOPY (EGD) WITH PROPOFOL;  Surgeon: Arta Silence, MD;  [facility];  Service: Endoscopy;  Laterality: N/A;  EUS N/A 07/18/2013   Procedure: ESOPHAGEAL ENDOSCOPIC ULTRASOUND (EUS) RADIAL;  Surgeon: Arta Silence, MD;  [facility];  Service: Endoscopy;  Laterality: N/A;   IR GENERIC HISTORICAL  10/30/2015   IR US GUIDE VASC ACCESS RIGHT 10/30/2015 Saverio Danker, PA-C [facility]   IR GENERIC HISTORICAL  10/30/2015   IR FLUORO GUIDE CV LINE RIGHT 10/30/2015 Saverio Danker, PA-C [facility]   LUMBAR WOUND DEBRIDEMENT N/A 10/01/2015   Procedure: LUMBAR WOUND DEBRIDEMENT;  Surgeon: Melina Schools, MD;  Location: [facility];  Service: Orthopedics;  Laterality: N/A;   LUMBAR WOUND DEBRIDEMENT N/A 10/15/2015   Procedure: [facility] OUT AND CLOSURE OF BACK WOUND;  Surgeon: Melina Schools, MD;  Location: [facility];  Service: Orthopedics;  Laterality: N/A;   MASS  EXCISION Left 07/24/2015   Procedure: EXCISION MASS LEFT LONG FINGER;  Surgeon: Leanora Cover, MD;  Location: [facility];  Service: Orthopedics;  Laterality: Left;   SPINAL FUSION N/A 09/17/2015   Procedure: FUSION POSTERIOR SPINAL MULTILEVEL L4- S1;  Surgeon: Melina Schools, MD;  Location: [facility] [place];  Service: Orthopedics;  Laterality: N/A;   TRANSFORAMINAL LUMBAR INTERBODY FUSION (TLIF) WITH PEDICLE SCREW FIXATION 1 LEVEL N/A 09/17/2015   Procedure: TRANSFORAMINAL LUMBAR INTERBODY FUSION (TLIF) WITH PEDICLE SCREW FIXATION 1 LEVEL , Lumbar 4-5;  Surgeon: Melina Schools, MD;  Location: [facility];  Service: Orthopedics;  Laterality: N/A;   Social History:  reports that she has never smoked. She has never used smokeless tobacco. She reports that she does not drink alcohol and does not use drugs. Family History:  Family History  Problem Relation Age of Onset   Heart disease Mother    Cancer Sister        ovarian     HOME MEDICATIONS: Allergies as of 09/24/2020       Reactions   Morphine Swelling   Face and tongue swelling   Toradol [ketorolac Tromethamine] Swelling   10/02/15 - given Toradol + morphine and RN noted tongue swelling - given Epi + Benadryl Has taken ibuprofen in past w/o problem   Codeine Other (See Comments)   headache        Medication List        Accurate as of September 24, 2020  4:13 PM. If you have any questions, ask your nurse or doctor.          allopurinol 300 MG tablet Commonly known as: ZYLOPRIM Take 300 mg by mouth daily.   amLODipine 5 MG tablet Commonly known as: NORVASC Take 5 mg by mouth daily.   aspirin EC 81 MG tablet Take 81 mg by mouth daily.   atorvastatin 10 MG tablet Commonly known as: LIPITOR Take 1 tablet by mouth once daily   benzonatate 100 MG capsule Commonly known as: TESSALON Take 100 mg by mouth 3 (three) times daily as needed.   colchicine 0.6 MG tablet Take 0.6 mg by mouth daily as needed (Gout).   dapagliflozin  propanediol 5 MG Tabs tablet Commonly known as: Farxiga Take 1 tablet (5 mg total) by mouth daily before breakfast. Started by: Dorita Sciara, MD   Durezol 0.05 % Emul Generic drug: Difluprednate 1-2 drops as directed. For dry eyes   furosemide 20 MG tablet Commonly known as: LASIX Take 1 tablet (20 mg total) by mouth every other day.   gabapentin 300 MG capsule Commonly known as: NEURONTIN Take 600 mg by mouth 3 (three) times daily.   glucose blood test strip Use as instructed to test  blood sugars 2 times daily E11.65   Januvia 100 MG tablet Generic drug: sitaGLIPtin Take 1 tablet (100 mg total) by mouth daily.   losartan 50 MG tablet Commonly known as: COZAAR Take 50 mg by mouth daily.   metFORMIN 1000 MG tablet Commonly known as: GLUCOPHAGE Take 1 tablet (1,000 mg total) by mouth daily with breakfast.   metoprolol tartrate 50 MG tablet Commonly known as: LOPRESSOR Take 0.5 tablets (25 mg total) by mouth 2 (two) times daily.   Multiple Vitamins tablet Take 1 tablet by mouth daily.   mupirocin ointment 2 % Commonly known as: BACTROBAN Apply 1 application topically daily.   nirmatrelvir & ritonavir 10 x 150 MG & 10 x 100MG Tbpk Commonly known as: PAXLOVID Take 1 tablet by mouth as directed.   omeprazole 20 MG capsule Commonly known as: PRILOSEC Take 20 mg by mouth as needed.   OneTouch Verio IQ System w/Device Kit 1 kit by Does not apply route 2 (two) times daily. E11.65   potassium chloride SA 20 MEQ tablet Commonly known as: KLOR-CON Take 20 mEq by mouth daily.   vitamin B-12 1000 MCG tablet Commonly known as: CYANOCOBALAMIN Take 1,000 mcg by mouth daily.         OBJECTIVE:   Vital Signs: BP 126/82 (BP Location: Left Arm, Patient Position: Sitting, Cuff Size: Large)   Pulse 79   Ht 5' 1.5" (1.562 m)   Wt 183 lb 6.4 oz (83.2 kg)   SpO2 97%   BMI 34.09 kg/m   Wt Readings from Last 3 Encounters:  09/24/20 183 lb 6.4 oz (83.2 kg)   05/12/20 184 lb 9.6 oz (83.7 kg)  03/18/20 188 lb (85.3 kg)     Exam: General: Pt appears well and is in NAD  Lungs: Clear with good BS bilat with no rales, rhonchi, or wheezes  Heart: RRR with normal S1 and S2 and no gallops; no murmurs; no rub  Extremities: Trace pretibial edema.   Neuro: MS is good with appropriate affect, pt is alert and Ox3    DM foot exam: 03/03/2020     The skin of the feet is intact without sores or ulcerations. The pedal pulses are 2+ on right and 2+ on left. The sensation is decreased  to a screening 5.07, 10 gram monofilament on the right.         DATA REVIEWED:  Lab Results  Component Value Date   LDLCALC 45 12/05/2019   CREATININE 1.13 (H) 04/02/2020     07/05/2020 BUN/Cr 18/1.02 GFR 50  ASSESSMENT / PLAN / RECOMMENDATIONS:   1) Type 2 Diabetes Mellitus, Optimally controlled, With Neuropathic complications - Most recent A1c of 7.1 %. Goal A1c < 7.0 %.    -Her A1c has increased from 6.0% to 7.1%, her BG in the office today is 355 mg/DL, this is after eating 2 hotdogs and sweet tea at cookout a couple hours ago.  I have counseled the patient about avoiding sugar sweetened beverages -We discussed add-on therapy with SGLT2 inhibitors, we discussed that the benefits we also discussed risk of genital infection -We had reduced her metformin by 50% in the past due to GFR less than 45   MEDICATIONS:  -Continue metformin 1000 mg ONCE daily  - Continue Januvia 100 mg daily   -Start Farxiga 5 mg daily   EDUCATION / INSTRUCTIONS: BG monitoring instructions: Patient is instructed to check her blood sugars 1 times a day, fasting  Call [facility] clinic if: BG  persistently < 70  I reviewed the Rule of 15 for the treatment of hypoglycemia in detail with the patient. Literature supplied.   2) Diabetic complications:  Eye: Does not have known diabetic retinopathy.  Neuro/ Feet: Does  have known diabetic peripheral neuropathy. Renal:  Patient does  have known baseline CKD. She is on an ACEI/ARB at present. Normal Ma/Cr ratio        F/U in 6 months   Signed electronically by: Mack Guise, MD  [facility] End[facility]  [facility] [address], [place] [address] Phone: [phone] FAX: [phone]   CC:  Buzzy Han, [place] [address] Phone: [phone]  Fax: [phone]  Return to Endocrinology clinic as below: Future Appointments  Date Time Provider [place]  11/03/2020 10:15 AM Trula Slade, DPM [facility]

## 2020-09-24 NOTE — Patient Instructions (Signed)
 -   Continue  Metformin 1000 mg one tablet day  - Continue Januvia 100 mg daily  - Start Farxiga 5 mg, 1 tablet daliy       - HOW TO TREAT LOW BLOOD SUGARS (Blood sugar LESS THAN 70 MG/DL) Please follow the RULE OF 15 for the treatment of hypoglycemia treatment (when your (blood sugars are less than 70 mg/dL)   STEP 1: Take 15 grams of carbohydrates when your blood sugar is low, which includes:  3-4 GLUCOSE TABS  OR 3-4 OZ OF JUICE OR REGULAR SODA OR ONE TUBE OF GLUCOSE GEL    STEP 2: RECHECK blood sugar in 15 MINUTES STEP 3: If your blood sugar is still low at the 15 minute recheck --> then, go back to STEP 1 and treat AGAIN with another 15 grams of carbohydrates.

## 2020-10-27 ENCOUNTER — Other Ambulatory Visit: Payer: Self-pay | Admitting: Internal Medicine

## 2020-11-03 ENCOUNTER — Ambulatory Visit: Payer: Medicare Other | Admitting: Podiatry

## 2020-11-09 ENCOUNTER — Other Ambulatory Visit: Payer: Self-pay | Admitting: Internal Medicine

## 2020-11-11 ENCOUNTER — Ambulatory Visit: Payer: Medicare Other | Admitting: Podiatry

## 2020-11-27 ENCOUNTER — Ambulatory Visit: Payer: Medicare Other | Admitting: Podiatry

## 2020-12-15 ENCOUNTER — Ambulatory Visit (INDEPENDENT_AMBULATORY_CARE_PROVIDER_SITE_OTHER): Payer: Medicare Other | Admitting: Podiatry

## 2020-12-15 ENCOUNTER — Other Ambulatory Visit: Payer: Self-pay

## 2020-12-15 DIAGNOSIS — M79675 Pain in left toe(s): Secondary | ICD-10-CM | POA: Diagnosis not present

## 2020-12-15 DIAGNOSIS — E1149 Type 2 diabetes mellitus with other diabetic neurological complication: Secondary | ICD-10-CM

## 2020-12-15 DIAGNOSIS — M79674 Pain in right toe(s): Secondary | ICD-10-CM | POA: Diagnosis not present

## 2020-12-15 DIAGNOSIS — B351 Tinea unguium: Secondary | ICD-10-CM | POA: Diagnosis not present

## 2020-12-18 NOTE — Progress Notes (Signed)
Subjective: 80 y.o. returns the office today for painful, elongated, thickened toenails which she cannot trim herself. Denies any redness or drainage around the nails.  She states that she has had some balance issues.  She has not had physical therapy in the last several years and previously was helpful for her.  No recent injuries that she reports.  Denies any systemic complaints such as fevers, chills, nausea, vomiting.   She has previously seen dermatology for the skin lesions  Objective: AAO 3, NAD DP/PT pulses palpable, CRT less than 3 seconds Sensation decreased with SWMF.  Nails hypertrophic, dystrophic, elongated, brittle, discolored 10. There is tenderness overlying the nails 1-5 bilaterally. There is no surrounding erythema or drainage along the nail sites. Minimal callus on the plantar aspect of the left foot. No underlying ulceration, drainage, or signs of infection.  No pain with calf compression, swelling, warmth, erythema.  Assessment: Patient presents with symptomatic onychomycosis; neuropathy; gait instability  Plan: -Treatment options including alternatives, risks, complications were discussed -Nails sharply debrided 10 without complication/bleeding.   -Referred to physical therapy for gait training and balance- [facility] -Continue gabapentin for neuropathy   Nancy [name] DPM

## 2020-12-30 ENCOUNTER — Other Ambulatory Visit: Payer: Self-pay | Admitting: Internal Medicine

## 2021-01-26 ENCOUNTER — Ambulatory Visit: Payer: Medicare Other | Admitting: Internal Medicine

## 2021-01-28 ENCOUNTER — Encounter: Payer: Self-pay | Admitting: Internal Medicine

## 2021-01-28 ENCOUNTER — Ambulatory Visit (INDEPENDENT_AMBULATORY_CARE_PROVIDER_SITE_OTHER): Payer: Medicare Other | Admitting: Internal Medicine

## 2021-01-28 ENCOUNTER — Other Ambulatory Visit: Payer: Self-pay

## 2021-01-28 VITALS — BP 124/76 | HR 73 | Ht 61.5 in | Wt 177.0 lb

## 2021-01-28 DIAGNOSIS — N1832 Chronic kidney disease, stage 3b: Secondary | ICD-10-CM

## 2021-01-28 DIAGNOSIS — E1142 Type 2 diabetes mellitus with diabetic polyneuropathy: Secondary | ICD-10-CM

## 2021-01-28 DIAGNOSIS — E1165 Type 2 diabetes mellitus with hyperglycemia: Secondary | ICD-10-CM

## 2021-01-28 DIAGNOSIS — E1122 Type 2 diabetes mellitus with diabetic chronic kidney disease: Secondary | ICD-10-CM | POA: Diagnosis not present

## 2021-01-28 LAB — BASIC METABOLIC PANEL
BUN: 19 mg/dL (ref 6–23)
CO2: 26 mEq/L (ref 19–32)
Calcium: 10.1 mg/dL (ref 8.4–10.5)
Chloride: 101 mEq/L (ref 96–112)
Creatinine, Ser: 1.33 mg/dL — ABNORMAL HIGH (ref 0.40–1.20)
GFR: 38.22 mL/min — ABNORMAL LOW (ref >60.00)
Glucose, Bld: 107 mg/dL — ABNORMAL HIGH (ref 70–99)
Potassium: 4.4 mEq/L (ref 3.5–5.1)
Sodium: 137 mEq/L (ref 135–145)

## 2021-01-28 LAB — LIPID PANEL
Cholesterol: 124 mg/dL (ref 0–200)
HDL: 32.4 mg/dL — ABNORMAL LOW (ref >39.00)
LDL Cholesterol: 71 mg/dL (ref 0–99)
NonHDL: 91.84
Total CHOL/HDL Ratio: 4
Triglycerides: 104 mg/dL (ref 0.0–149.0)
VLDL: 20.8 mg/dL (ref 0.0–40.0)

## 2021-01-28 LAB — POCT GLYCOSYLATED HEMOGLOBIN (HGB A1C): Hemoglobin A1C: 7.8 % — AB (ref 4.0–5.6)

## 2021-01-28 LAB — MICROALBUMIN / CREATININE URINE RATIO
Creatinine,U: 125.8 mg/dL
Microalb Creat Ratio: 2.2 mg/g (ref 0.0–30.0)
Microalb, Ur: 2.8 mg/dL — ABNORMAL HIGH (ref 0.0–1.9)

## 2021-01-28 MED ORDER — JANUVIA 100 MG PO TABS: 100.0000 mg | ORAL_TABLET | Freq: Every day | ORAL | 3 refills | Status: DC

## 2021-01-28 MED ORDER — DAPAGLIFLOZIN PROPANEDIOL 10 MG PO TABS: 10.0000 mg | ORAL_TABLET | Freq: Every day | ORAL | 3 refills | Status: DC

## 2021-01-28 MED ORDER — METFORMIN HCL 1000 MG PO TABS: 1000.0000 mg | ORAL_TABLET | Freq: Every day | ORAL | 3 refills | Status: DC

## 2021-01-28 NOTE — Patient Instructions (Addendum)
° °-   Continue  Metformin 1000 mg one tablet day  - Continue Januvia 100 mg daily  - Increase Farxiga 10  mg, 1 tablet daliy       - HOW TO TREAT LOW BLOOD SUGARS (Blood sugar LESS THAN 70 MG/DL) Please follow the RULE OF 15 for the treatment of hypoglycemia treatment (when your (blood sugars are less than 70 mg/dL)   STEP 1: Take 15 grams of carbohydrates when your blood sugar is low, which includes:  3-4 GLUCOSE TABS  OR 3-4 OZ OF JUICE OR REGULAR SODA OR ONE TUBE OF GLUCOSE GEL    STEP 2: RECHECK blood sugar in 15 MINUTES STEP 3: If your blood sugar is still low at the 15 minute recheck --> then, go back to STEP 1 and treat AGAIN with another 15 grams of carbohydrates.

## 2021-01-28 NOTE — Progress Notes (Signed)
`     Name: Nancy [name]  Age/ Sex: 81 y.o., female   MRN/ DOB: [id number], [birth year]     PCP: Buzzy Han, MD   Reason for Endocrinology Evaluation: Type 2 Diabetes Mellitus  Initial Endocrine Consultative Visit: 10/31/2017    PATIENT IDENTIFIER: Nancy [name] is a 81 y.o. female with a past medical history of HTN, Gout, Hyperlipidemia, and T2 DM . The patient has followed with Endocrinology clinic since 10/31/17 for consultative assistance with management of her diabetes.  DIABETIC HISTORY:  Nancy [name] was diagnosed with T2DM ~ in 2009, she has been on oral glycemic agents since her diagnosis. Has not been on insulin prior to her presentation here. Her hemoglobin A1c has ranged from 7.3% in 09/2015, peaking at 8.6% in 08/2015.  Glipizide stopped 06/2019 with an A1c of 6.0 %  Started Farxiga 09/24/2020  SUBJECTIVE:   During the last visit (09/24/2020): Her A1c was 7.1%.  continued  Metformin, and  Januvia and started [place]     Today (01/28/2021): Nancy [name] is here for a 6 month follow up on diabetes management. She checks her blood sugars occasionally.The patient has not had had hypoglycemic episodes since the last clinic visit.  She has been  noted with weight loss  Denies nausea, vomiting or diarrhea . Was seen by urgent care for abdominal pain  earlier in the month    HOME DIABETES REGIMEN:  Metfomrin 1000 mg daily  Januvia 100 mg daily  Farxiga 5 mg daily    METER DOWNLOAD SUMMARY: Unable to download   130-240 mg/dL      DIABETIC COMPLICATIONS: Microvascular complications:  Neuropathy Denies: retinopathy, nephropathy Last eye exam: Completed 08/2020     Macrovascular complications:    Denies: CAD, PVD, CVA    HISTORY:  Past Medical History:  Past Medical History:  Diagnosis Date   Allergy    Anemia    Aortic atherosclerosis ([place])    Breast cyst    left breast   CAD (coronary artery disease), native coronary artery     minimal ASCAD to the distal LM and mid LAD with 0-24% stenosis and Calcium score of 26.   Chest pain    CKD (chronic kidney disease), stage III (HCC)    Complication of anesthesia 1986   slow to awaken after cholecystectomy   Diastolic dysfunction    with mild pulmonary HTN by echo 07/2011   Gastric ulcer    GERD (gastroesophageal reflux disease)    Gout    Hyperlipidemia    Hypertension    Neuropathy    from diabetes per patient   Osteoarthritis    Osteopenia    Shortness of breath dyspnea    with activity   Sinus pause    Spine deformity    lower spine   Type II diabetes mellitus ([place])    Past Surgical History:  Past Surgical History:  Procedure Laterality Date   ABDOMINAL ADHESION SURGERY     ABDOMINAL HYSTERECTOMY     ANUS SURGERY     for torn tissue   APPENDECTOMY     BREAST BIOPSY     BREAST LUMPECTOMY     left   CHOLECYSTECTOMY  1986   COLONOSCOPY     ESOPHAGOGASTRODUODENOSCOPY (EGD) WITH PROPOFOL N/A 07/18/2013   Procedure: ESOPHAGOGASTRODUODENOSCOPY (EGD) WITH PROPOFOL;  Surgeon: Arta Silence, MD;  Location: [facility];  Service: Endoscopy;  Laterality: N/A;   EUS N/A 07/18/2013   Procedure: ESOPHAGEAL ENDOSCOPIC ULTRASOUND (EUS) RADIAL;  Surgeon: Arta Silence, MD;  Location: [facility];  Service: Endoscopy;  Laterality: N/A;   IR GENERIC HISTORICAL  10/30/2015   IR US GUIDE VASC ACCESS RIGHT 10/30/2015 Saverio Danker, PA-C [facility]   IR GENERIC HISTORICAL  10/30/2015   IR FLUORO GUIDE CV LINE RIGHT 10/30/2015 Saverio Danker, PA-C [facility]   LUMBAR WOUND DEBRIDEMENT N/A 10/01/2015   Procedure: LUMBAR WOUND DEBRIDEMENT;  Surgeon: Melina Schools, MD;  Location: [place];  Service: Orthopedics;  Laterality: N/A;   LUMBAR WOUND DEBRIDEMENT N/A 10/15/2015   Procedure: [facility] OUT AND CLOSURE OF BACK WOUND;  Surgeon: Melina Schools, MD;  Location: [place];  Service: Orthopedics;  Laterality: N/A;   MASS EXCISION Left 07/24/2015   Procedure: EXCISION MASS LEFT LONG  FINGER;  Surgeon: Leanora Cover, MD;  Location: [place];  Service: Orthopedics;  Laterality: Left;   SPINAL FUSION N/A 09/17/2015   Procedure: FUSION POSTERIOR SPINAL MULTILEVEL L4- S1;  Surgeon: Melina Schools, MD;  Location: [place];  Service: Orthopedics;  Laterality: N/A;   TRANSFORAMINAL LUMBAR INTERBODY FUSION (TLIF) WITH PEDICLE SCREW FIXATION 1 LEVEL N/A 09/17/2015   Procedure: TRANSFORAMINAL LUMBAR INTERBODY FUSION (TLIF) WITH PEDICLE SCREW FIXATION 1 LEVEL , Lumbar 4-5;  Surgeon: Melina Schools, MD;  Location: [place];  Service: Orthopedics;  Laterality: N/A;   Social History:  reports that she has never smoked. She has never used smokeless tobacco. She reports that she does not drink alcohol and does not use drugs. Family History:  Family History  Problem Relation Age of Onset   Heart disease Mother    Cancer Sister        ovarian     HOME MEDICATIONS: Allergies as of 01/28/2021       Reactions   Morphine Swelling   Face and tongue swelling   Toradol [ketorolac Tromethamine] Swelling   10/02/15 - given Toradol + morphine and RN noted tongue swelling - given Epi + Benadryl Has taken ibuprofen in past w/o problem   Codeine Other (See Comments)   headache        Medication List        Accurate as of January 28, 2021 10:22 AM. If you have any questions, ask your nurse or doctor.          allopurinol 300 MG tablet Commonly known as: ZYLOPRIM Take 300 mg by mouth daily.   amLODipine 5 MG tablet Commonly known as: NORVASC Take 5 mg by mouth daily.   aspirin EC 81 MG tablet Take 81 mg by mouth daily.   atorvastatin 10 MG tablet Commonly known as: LIPITOR Take 1 tablet by mouth once daily   benzonatate 100 MG capsule Commonly known as: TESSALON Take 100 mg by mouth 3 (three) times daily as needed.   colchicine 0.6 MG tablet Take 0.6 mg by mouth daily as needed (Gout).   dapagliflozin propanediol 5 MG Tabs tablet Commonly known as: Farxiga Take 1  tablet (5 mg total) by mouth daily before breakfast.   Durezol 0.05 % Emul Generic drug: Difluprednate 1-2 drops as directed. For dry eyes   famotidine 20 MG tablet Commonly known as: PEPCID Take 20 mg by mouth 2 (two) times daily.   furosemide 20 MG tablet Commonly known as: LASIX Take 1 tablet (20 mg total) by mouth every other day.   gabapentin 300 MG capsule Commonly known as: NEURONTIN Take 600 mg by mouth 3 (three) times daily.   glucose blood test strip Use as instructed to test  blood sugars 2 times daily E11.65   Januvia 100 MG tablet Generic drug: sitaGLIPtin Take 1 tablet by mouth once daily   losartan 50 MG tablet Commonly known as: COZAAR Take 50 mg by mouth daily.   metFORMIN 1000 MG tablet Commonly known as: GLUCOPHAGE Take 1 tablet (1,000 mg total) by mouth daily with breakfast.   metoprolol tartrate 50 MG tablet Commonly known as: LOPRESSOR Take 0.5 tablets (25 mg total) by mouth 2 (two) times daily.   Multiple Vitamins tablet Take 1 tablet by mouth daily.   mupirocin ointment 2 % Commonly known as: BACTROBAN Apply 1 application topically daily.   nirmatrelvir & ritonavir 10 x 150 MG & 10 x 100MG Tbpk Commonly known as: PAXLOVID Take 1 tablet by mouth as directed.   omeprazole 20 MG capsule Commonly known as: PRILOSEC Take 20 mg by mouth as needed.   OneTouch Verio IQ System w/Device Kit 1 kit by Does not apply route 2 (two) times daily. E11.65   potassium chloride SA 20 MEQ tablet Commonly known as: KLOR-CON M Take 20 mEq by mouth daily.   sucralfate 1 GM/10ML suspension Commonly known as: CARAFATE SMARTSIG:Milliliter(s) By Mouth   tiZANidine 4 MG tablet Commonly known as: ZANAFLEX Take 4 mg by mouth 2 (two) times daily as needed.   traMADol 50 MG tablet Commonly known as: ULTRAM Take 50 mg by mouth every 6 (six) hours as needed.   vitamin B-12 1000 MCG tablet Commonly known as: CYANOCOBALAMIN Take 1,000 mcg by mouth daily.          OBJECTIVE:   Vital Signs: BP 124/76 (BP Location: Left Arm, Patient Position: Sitting, Cuff Size: Small)    Pulse 73    Ht 5' 1.5" (1.562 m)    Wt 177 lb (80.3 kg)    SpO2 95%    BMI 32.90 kg/m   Wt Readings from Last 3 Encounters:  01/28/21 177 lb (80.3 kg)  09/24/20 183 lb 6.4 oz (83.2 kg)  05/12/20 184 lb 9.6 oz (83.7 kg)     Exam: General: Pt appears well and is in NAD  Lungs: Clear with good BS bilat with no rales, rhonchi, or wheezes  Heart: RRR   Extremities: No  pretibial edema.   Neuro: MS is good with appropriate affect, pt is alert and Ox3    DM foot exam: 01/28/2021    The skin of the feet is intact without sores or ulcerations. The pedal pulses are 2+ on right and 2+ on left. The sensation is decreased  to a screening 5.07, 10 gram monofilament at the left great toe         DATA REVIEWED:   Latest Reference Range & Units 01/28/21 10:36  Sodium 135 - 145 mEq/L 137  Potassium 3.5 - 5.1 mEq/L 4.4  Chloride 96 - 112 mEq/L 101  CO2 19 - 32 mEq/L 26  Glucose 70 - 99 mg/dL 107 (H)  BUN 6 - 23 mg/dL 19  Creatinine 0.40 - 1.20 mg/dL 1.33 (H)  Calcium 8.4 - 10.5 mg/dL 10.1  GFR >60.00 mL/min 38.22 (L)    Latest Reference Range & Units 01/28/21 10:36  Total CHOL/HDL Ratio  4  Cholesterol 0 - 200 mg/dL 124  HDL Cholesterol >39.00 mg/dL 32.40 (L)  LDL (calc) 0 - 99 mg/dL 71  MICROALB/CREAT RATIO 0.0 - 30.0 mg/g 2.2  NonHDL  91.84  Triglycerides 0.0 - 149.0 mg/dL 104.0  VLDL 0.0 - 40.0 mg/dL 20.8     Latest  Reference Range & Units 01/28/21 10:36  Creatinine,U mg/dL 125.8  Microalb, Ur 0.0 - 1.9 mg/dL 2.8 (H)  MICROALB/CREAT RATIO 0.0 - 30.0 mg/g 2.2   ASSESSMENT / PLAN / RECOMMENDATIONS:   1) Type 2 Diabetes Mellitus, Sub -Optimally controlled, With Neuropathic complications - Most recent A1c of 7.8 %. Goal A1c < 7.0 %.    - Her A1c has increased from 7.1 % to 7.8 % , this is due to dietary indiscretions during the holidays  - We had reduced  her metformin by 50% in the past due to GFR less than 45 - Tolerating Farxiga , will increase  -Her GFR has trended down, I will encourage hydration, she is on appropriate dose of metformin with a GFR >35 .  -We will decrease Januvia due to low GFR  MEDICATIONS:  -Continue metformin 1000 mg ONCE daily  -Decrease Januvia 50 mg daily  -Increase Farxiga 10 mg daily   EDUCATION / INSTRUCTIONS: BG monitoring instructions: Patient is instructed to check her blood sugars 1 times a day, fasting  Call [place] [facility] clinic if: BG persistently < 70  I reviewed the Rule of 15 for the treatment of hypoglycemia in detail with the patient. Literature supplied.   2) Diabetic complications:  Eye: Does not have known diabetic retinopathy.  Neuro/ Feet: Does  have known diabetic peripheral neuropathy. Renal: Patient does  have known baseline CKD. She is on an ACEI/ARB at present. Normal Ma/Cr ratio     3)Dyslipidemia:  -Lipid panel acceptable please continue atorvastatin   Medication Continue atorvastatin 10 mg daily    F/U in 6 months   Signed electronically by: Mack Guise, MD  [facility]  [facility] [address], [place] [address] Phone: [phone] FAX: [phone]   CC:  Buzzy Han, [place] [address] Phone: [phone]  Fax: [phone]  Return to Endocrinology clinic as below: Future Appointments  Date Time Provider [place]  03/16/2021  1:15 PM Trula Slade, DPM [facility]

## 2021-01-29 MED ORDER — SITAGLIPTIN PHOSPHATE 50 MG PO TABS: 50.0000 mg | ORAL_TABLET | Freq: Every day | ORAL | 3 refills | Status: DC

## 2021-02-12 ENCOUNTER — Other Ambulatory Visit: Payer: Self-pay

## 2021-02-12 MED ORDER — DAPAGLIFLOZIN PROPANEDIOL 10 MG PO TABS: 10.0000 mg | ORAL_TABLET | Freq: Every day | ORAL | 2 refills | Status: DC

## 2021-03-16 ENCOUNTER — Ambulatory Visit: Payer: Medicare Other | Admitting: Podiatry

## 2021-03-16 ENCOUNTER — Other Ambulatory Visit: Payer: Self-pay | Admitting: Cardiology

## 2021-03-27 ENCOUNTER — Other Ambulatory Visit: Payer: Self-pay

## 2021-03-27 ENCOUNTER — Ambulatory Visit (INDEPENDENT_AMBULATORY_CARE_PROVIDER_SITE_OTHER): Payer: Medicare Other | Admitting: Podiatry

## 2021-03-27 DIAGNOSIS — E1149 Type 2 diabetes mellitus with other diabetic neurological complication: Secondary | ICD-10-CM

## 2021-03-27 DIAGNOSIS — M79675 Pain in left toe(s): Secondary | ICD-10-CM

## 2021-03-27 DIAGNOSIS — M79674 Pain in right toe(s): Secondary | ICD-10-CM | POA: Diagnosis not present

## 2021-03-27 DIAGNOSIS — B351 Tinea unguium: Secondary | ICD-10-CM | POA: Diagnosis not present

## 2021-04-01 NOTE — Progress Notes (Signed)
Subjective: ?81 y.o. returns the office today for painful, elongated, thickened toenails which she cannot trim herself. Denies any redness or drainage around the nails.  States that she still some balance issues but she did not do physical therapy.  She is asking for new referral.  No recent injuries.  ? ?Objective: ?AAO ?3, NAD ?DP/PT pulses palpable, CRT less than 3 seconds ?Sensation decreased with SWMF.  ?Nails hypertrophic, dystrophic, elongated, brittle, discolored ?10. There is tenderness overlying the nails 1-5 bilaterally. There is no surrounding erythema or drainage along the nail sites. ?Minimal callus on the plantar aspect of the left foot. No underlying ulceration, drainage, or signs of infection.  ?No pain with calf compression, swelling, warmth, erythema. ? ?Assessment: ?Patient presents with symptomatic onychomycosis; neuropathy; gait instability ? ?Plan: ?-Treatment options including alternatives, risks, complications were discussed ?-Nails sharply debrided ?10 without complication/bleeding.   ?-We ordered physical therapy for gait training and balance- [facility] ?-Continue gabapentin for neuropathy ? ? ?Trula Slade DPM ? ? ?

## 2021-04-13 ENCOUNTER — Telehealth: Payer: Self-pay | Admitting: Cardiology

## 2021-04-13 NOTE — Telephone Encounter (Signed)
Called pt in regards to chest pain.  Pt reports not currently having chest pain but has noticed chest pressure/ heaviness to middle of chest for the past few days that comes and goes.  Feels like pins/ needles to chest area.   ?Pt has SOB with activity.  Denies nausea, jaw, neck, and arm  pain.  Last BP reading from end of Feb 128/62.  Scheduled pt to see DOD Dr. Radford Pax 04/14/21 at 11 am.  Advised pt if CP worsens or becomes bothersome to call 911 for evaluation.  Pt verbalizes understanding.  ?

## 2021-04-13 NOTE — Telephone Encounter (Signed)
Pt c/o of Chest Pain: STAT if CP now or developed within 24 hours ? ?1. Are you having CP right now? Yes, discomfort in the middle of chest  ? ?2. Are you experiencing any other symptoms (ex. SOB, nausea, vomiting, sweating)? SOB  ? ?3. How long have you been experiencing CP? 3 days ago  ? ?4. Is your CP continuous or coming and going? Coming and going  ? ?5. Have you taken Nitroglycerin? No  ? ?Transferred to triage due to being STAT.  ??  ?

## 2021-04-14 ENCOUNTER — Ambulatory Visit (INDEPENDENT_AMBULATORY_CARE_PROVIDER_SITE_OTHER): Payer: Medicare Other | Admitting: Cardiology

## 2021-04-14 ENCOUNTER — Encounter: Payer: Self-pay | Admitting: Cardiology

## 2021-04-14 VITALS — BP 118/58 | HR 63 | Ht 62.0 in | Wt 180.2 lb

## 2021-04-14 DIAGNOSIS — G4733 Obstructive sleep apnea (adult) (pediatric): Secondary | ICD-10-CM | POA: Diagnosis not present

## 2021-04-14 DIAGNOSIS — I10 Essential (primary) hypertension: Secondary | ICD-10-CM

## 2021-04-14 DIAGNOSIS — I2510 Atherosclerotic heart disease of native coronary artery without angina pectoris: Secondary | ICD-10-CM | POA: Diagnosis not present

## 2021-04-14 DIAGNOSIS — I493 Ventricular premature depolarization: Secondary | ICD-10-CM

## 2021-04-14 DIAGNOSIS — I272 Pulmonary hypertension, unspecified: Secondary | ICD-10-CM

## 2021-04-14 DIAGNOSIS — I2583 Coronary atherosclerosis due to lipid rich plaque: Secondary | ICD-10-CM

## 2021-04-14 DIAGNOSIS — R072 Precordial pain: Secondary | ICD-10-CM

## 2021-04-14 DIAGNOSIS — E7800 Pure hypercholesterolemia, unspecified: Secondary | ICD-10-CM

## 2021-04-14 LAB — BASIC METABOLIC PANEL
BUN/Creatinine Ratio: 16 (ref 12–28)
BUN: 16 mg/dL (ref 8–27)
CO2: 22 mmol/L (ref 20–29)
Calcium: 9.8 mg/dL (ref 8.7–10.3)
Chloride: 94 mmol/L — ABNORMAL LOW (ref 96–106)
Creatinine, Ser: 0.99 mg/dL (ref 0.57–1.00)
Glucose: 237 mg/dL — ABNORMAL HIGH (ref 70–99)
Potassium: 4.4 mmol/L (ref 3.5–5.2)
Sodium: 132 mmol/L — ABNORMAL LOW (ref 134–144)
eGFR: 58 mL/min/{1.73_m2} — ABNORMAL LOW (ref >59)

## 2021-04-14 NOTE — Patient Instructions (Addendum)
Medication Instructions:  ?Your physician recommends that you continue on your current medications as directed. Please refer to the Current Medication list given to you today. ? ?*If you need a refill on your cardiac medications before your next appointment, please call your pharmacy* ? ?Lab Work: ?TODAY: BMET ?If you have labs (blood work) drawn today and your tests are completely normal, you will receive your results only by: ?MyChart Message (if you have MyChart) OR ?A paper copy in the mail ?If you have any lab test that is abnormal or we need to change your treatment, we will call you to review the results. ? ?Testing/Procedures: ?Your provider has requested that you have a coronary CTA scan. Please see next page for further instructions.  ? ?Follow-Up: ?At [facility], you and your health needs are our priority.  As part of our continuing mission to provide you with exceptional heart care, we have created designated Provider Care Teams.  These Care Teams include your primary Cardiologist (physician) and Advanced Practice Providers (APPs -  Physician Assistants and Nurse Practitioners) who all work together to provide you with the care you need, when you need it. ? ?Your next appointment:   ?1 year(s) ? ?The format for your next appointment:   ?In Person ? ?Provider:   ?Fransico Him, MD   ? ? ?Other Instructions ? ? ?Your cardiac CT will be scheduled at:  ? ?[facility] ?[address] ?[place], [place] [address] ?[phone] ? ?Please arrive at the [facility] and Children's Entrance (Entrance C2) of [facility] 30 minutes prior to test start time. ?You can use the FREE valet parking offered at entrance C (encouraged to control the heart rate for the test)  ?Proceed to the [facility] Radiology Department (first floor) to check-in and test prep. ? ?All radiology patients and guests should use entrance C2 at [facility], accessed from [facility], even though the  hospital's physical address listed is [address] ? ? ? ? ?Please follow these instructions carefully (unless otherwise directed): ? ?On the Night Before the Test: ?Be sure to Drink plenty of water. ?Do not consume any caffeinated/decaffeinated beverages or chocolate 12 hours prior to your test. ?Do not take any antihistamines 12 hours prior to your test. ? ?On the Day of the Test: ?Drink plenty of water until 1 hour prior to the test. ?Do not eat any food 4 hours prior to the test. ?You may take your regular medications prior to the test.  ?Take metoprolol (Lopressor) 50 mg two hours prior to test. ?HOLD Furosemide morning of the test. ?FEMALES- please wear underwire-free bra if available, avoid dresses & tight clothing ? ?     ?After the Test: ?Drink plenty of water. ?After receiving IV contrast, you may experience a mild flushed feeling. This is normal. ?On occasion, you may experience a mild rash up to 24 hours after the test. This is not dangerous. If this occurs, you can take Benadryl 25 mg and increase your fluid intake. ?If you experience trouble breathing, this can be serious. If it is severe call 911 IMMEDIATELY. If it is mild, please call our office. ?If you take any of these medications: Glipizide/Metformin, Avandament, Glucavance, please do not take 48 hours after completing test unless otherwise instructed. ? ?We will call to schedule your test 2-4 weeks out understanding that some insurance companies will need an authorization prior to the service being performed.  ? ?For non-scheduling related questions,  please contact the cardiac imaging nurse navigator should you have any questions/concerns: ?Marchia Bond, Cardiac Imaging Nurse Navigator ?Gordy Clement, Cardiac Imaging Nurse Navigator ?[facility] ?Direct Office Dial: [phone]  ? ?For scheduling needs, including cancellations and rescheduling, please call [place], [phone].   ?

## 2021-04-14 NOTE — Progress Notes (Signed)
?Cardiology Office Note:   ? ?Date:  04/14/2021  ? ?ID:  Nancy [name], DOB 1940/11/19, MRN [id number] ? ?PCP:  Buzzy Han, MD  ?Cardiologist:  Fransico Him, MD   ? ?Referring MD: Buzzy Han*  ? ?Chief Complaint  ?Patient presents with  ? Coronary Artery Disease  ? Hypertension  ? Hyperlipidemia  ? Sleep Apnea  ? ? ?History of Present Illness:   ? ?Nancy [name] is a 81 y.o. female with a hx of HTN and diastolic dysfunction .  She also has a history of mild pulmonary hypertension by 2D echocardiogram in July 2013.  She had a Lexiscan Myoview for chest pain 10/13/2016 which showed no inducible ischemia. 2D echo in 2019 showed  normal LVF with EF 60-65% with moderate LVH and elevated filling pressures. She also has a hx of OSA and is on PAP therapy.  ? ?She also has a history of atypical CP and DOE and coronary CTA was done which showed minimal CAD of the LM, mid and distal LAD and her CP was felt to be non cardiac in origin. 2D echo was normal with severe ASSH, no PHTN and cardiac MRI was done showing normal LVF with EF 73%, moderate BSH, normal RV and no evidence of cardiac amyloid or HOCM and findings c/w hypertensive heart disease.  ? ?She is here today for followup and is doing well.  Recently she has been having pressure in the center of her chest.  She will get a pins and needles sensation in her chest with some pressure that radiates into her back.  She felt hot and sat down and it resolved.  She was also SOB with the discomfort.  She says that the chest discomfort is more frequent and stronger than it used to be.  She also has been having DOE that is slightly worse than before. She denies any PND, orthopnea, LE edema, palpitations or syncope. She is compliant with her meds and is tolerating meds with no SE.    ? ?She is doing well with her CPAP device and thinks that she has gotten used to it.  She tolerates the mask and feels the pressure is adequate.  Since going on CPAP she  feels rested in the am and has no significant daytime sleepiness.  She denies any significant mouth or nasal dryness or nasal congestion.  She does not think that he snores.    ? ?Past Medical History:  ?Diagnosis Date  ? Allergy   ? Anemia   ? Aortic atherosclerosis ([place])   ? Breast cyst   ? left breast  ? CAD (coronary artery disease), native coronary artery   ? minimal ASCAD to the distal LM and mid LAD with 0-24% stenosis and Calcium score of 26.  ? Chest pain   ? CKD (chronic kidney disease), stage III ([place])   ? Complication of anesthesia 1986  ? slow to awaken after cholecystectomy  ? Diastolic dysfunction   ? with mild pulmonary HTN by echo 07/2011  ? Gastric ulcer   ? GERD (gastroesophageal reflux disease)   ? Gout   ? Hyperlipidemia   ? Hypertension   ? Neuropathy   ? from diabetes per patient  ? Osteoarthritis   ? Osteopenia   ? Shortness of breath dyspnea   ? with activity  ? Sinus pause   ? Spine deformity   ? lower spine  ? Type II diabetes mellitus ([place])   ? ? ?Past Surgical History:  ?Procedure  Laterality Date  ? ABDOMINAL ADHESION SURGERY    ? ABDOMINAL HYSTERECTOMY    ? ANUS SURGERY    ? for torn tissue  ? APPENDECTOMY    ? BREAST BIOPSY    ? BREAST LUMPECTOMY    ? left  ? CHOLECYSTECTOMY  1986  ? COLONOSCOPY    ? ESOPHAGOGASTRODUODENOSCOPY (EGD) WITH PROPOFOL N/A 07/18/2013  ? Procedure: ESOPHAGOGASTRODUODENOSCOPY (EGD) WITH PROPOFOL;  Surgeon: Arta Silence, MD;  Location: [facility];  Service: Endoscopy;  Laterality: N/A;  ? EUS N/A 07/18/2013  ? Procedure: ESOPHAGEAL ENDOSCOPIC ULTRASOUND (EUS) RADIAL;  Surgeon: Arta Silence, MD;  Location: [facility];  Service: Endoscopy;  Laterality: N/A;  ? IR GENERIC HISTORICAL  10/30/2015  ? IR US GUIDE VASC ACCESS RIGHT 10/30/2015 Saverio Danker, PA-C [facility]  ? IR GENERIC HISTORICAL  10/30/2015  ? IR FLUORO GUIDE CV LINE RIGHT 10/30/2015 Saverio Danker, PA-C [facility]  ? LUMBAR WOUND DEBRIDEMENT N/A 10/01/2015  ? Procedure: LUMBAR WOUND  DEBRIDEMENT;  Surgeon: Melina Schools, MD;  Location: [place];  Service: Orthopedics;  Laterality: N/A;  ? LUMBAR WOUND DEBRIDEMENT N/A 10/15/2015  ? Procedure: [facility] OUT AND CLOSURE OF BACK WOUND;  Surgeon: Melina Schools, MD;  Location: [place];  Service: Orthopedics;  Laterality: N/A;  ? MASS EXCISION Left 07/24/2015  ? Procedure: EXCISION MASS LEFT LONG FINGER;  Surgeon: Leanora Cover, MD;  Location: [place];  Service: Orthopedics;  Laterality: Left;  ? SPINAL FUSION N/A 09/17/2015  ? Procedure: FUSION POSTERIOR SPINAL MULTILEVEL L4- S1;  Surgeon: Melina Schools, MD;  Location: [place];  Service: Orthopedics;  Laterality: N/A;  ? TRANSFORAMINAL LUMBAR INTERBODY FUSION (TLIF) WITH PEDICLE SCREW FIXATION 1 LEVEL N/A 09/17/2015  ? Procedure: TRANSFORAMINAL LUMBAR INTERBODY FUSION (TLIF) WITH PEDICLE SCREW FIXATION 1 LEVEL , Lumbar 4-5;  Surgeon: Melina Schools, MD;  Location: [place];  Service: Orthopedics;  Laterality: N/A;  ? ? ?Current Medications: ?Current Meds  ?Medication Sig  ? allopurinol (ZYLOPRIM) 300 MG tablet Take 300 mg by mouth daily.   ? amLODipine (NORVASC) 5 MG tablet Take 5 mg by mouth daily.  ? aspirin EC 81 MG tablet Take 81 mg by mouth daily.  ? atorvastatin (LIPITOR) 10 MG tablet Take 1 tablet (10 mg total) by mouth daily. Please make overdue appt with Dr.Lyon Dumont before anymore refills. Thank you 1st attempt  ? benzonatate (TESSALON) 100 MG capsule Take 100 mg by mouth 3 (three) times daily as needed.  ? Blood Glucose Monitoring Suppl (ONETOUCH VERIO IQ SYSTEM) w/Device KIT 1 kit by Does not apply route 2 (two) times daily. E11.65  ? colchicine 0.6 MG tablet Take 0.6 mg by mouth daily as needed (Gout).  ? dapagliflozin propanediol (FARXIGA) 10 MG TABS tablet Take 1 tablet (10 mg total) by mouth daily before breakfast.  ? DUREZOL 0.05 % EMUL 1-2 drops as directed. For dry eyes  ? famotidine (PEPCID) 20 MG tablet Take 20 mg by mouth 2 (two) times daily.  ? furosemide (LASIX) 20 MG tablet Take 1  tablet (20 mg total) by mouth every other day.  ? gabapentin (NEURONTIN) 300 MG capsule Take 600 mg by mouth 3 (three) times daily.  ? glucose blood test strip Use as instructed to test blood sugars 2 times daily E11.65  ? losartan (COZAAR) 50 MG tablet Take 50 mg by mouth daily.  ? metFORMIN (GLUCOPHAGE) 1000 MG tablet Take 1 tablet (1,000 mg total) by mouth daily with breakfast.  ? metoprolol tartrate (LOPRESSOR) 50 MG  tablet Take 0.5 tablets (25 mg total) by mouth 2 (two) times daily.  ? Multiple Vitamins tablet Take 1 tablet by mouth daily.  ? mupirocin ointment (BACTROBAN) 2 % Apply 1 application topically daily.  ? nirmatrelvir & ritonavir (PAXLOVID) 10 x 150 MG & 10 x 100MG TBPK Take 1 tablet by mouth as directed.  ? omeprazole (PRILOSEC) 20 MG capsule Take 20 mg by mouth as needed.   ? potassium chloride SA (KLOR-CON) 20 MEQ tablet Take 20 mEq by mouth daily.  ? sitaGLIPtin (JANUVIA) 50 MG tablet Take 1 tablet (50 mg total) by mouth daily.  ? sucralfate (CARAFATE) 1 GM/10ML suspension SMARTSIG:Milliliter(s) By Mouth  ? tiZANidine (ZANAFLEX) 4 MG tablet Take 4 mg by mouth 2 (two) times daily as needed.  ? traMADol (ULTRAM) 50 MG tablet Take 50 mg by mouth every 6 (six) hours as needed.  ? vitamin B-12 (CYANOCOBALAMIN) 1000 MCG tablet Take 1,000 mcg by mouth daily.  ?  ? ?Allergies:   Morphine, Toradol [ketorolac tromethamine], and Codeine  ? ?Social History  ? ?Socioeconomic History  ? Marital status: Divorced  ?  Spouse name: Not on file  ? Number of children: 4  ? Years of education: 22  ? Highest education level: Not on file  ?Occupational History  ? Not on file  ?Tobacco Use  ? Smoking status: Never  ? Smokeless tobacco: Never  ?Vaping Use  ? Vaping Use: Never used  ?Substance and Sexual Activity  ? Alcohol use: No  ?  Comment: occasional wine  ? Drug use: No  ? Sexual activity: Not Currently  ?Other Topics Concern  ? Not on file  ?Social History Narrative  ? Patient is single, has 3 children living 1  deceased  ? Patient is right handed  ? Education level is 12  ? Caffeine consumption is 0  ? ?Social Determinants of Health  ? ?Financial Resource Strain: Not on file  ?Food Insecurity: Not on file  ?Transportation

## 2021-04-14 NOTE — Addendum Note (Signed)
Addended by: Antonieta Iba on: 04/14/2021 11:57 AM ? ? Modules accepted: Orders ? ?

## 2021-04-15 ENCOUNTER — Telehealth: Payer: Self-pay | Admitting: Cardiology

## 2021-04-15 NOTE — Telephone Encounter (Signed)
-----   Message from Sueanne Margarita, MD sent at 04/15/2021  8:40 AM EDT ----- ?Stable renal function and potassium but blood sugar much higher than usual.  Please forward this to primary physician for further evaluation treatment ?

## 2021-04-15 NOTE — Telephone Encounter (Signed)
° °  Pt is returning call to get lab result °

## 2021-04-15 NOTE — Telephone Encounter (Signed)
The patient has been notified of the result and verbalized understanding.  All questions (if any) were answered. ?Antonieta Iba, RN 04/15/2021 12:11 PM  ? ?

## 2021-04-27 ENCOUNTER — Telehealth (HOSPITAL_COMMUNITY): Payer: Self-pay | Admitting: *Deleted

## 2021-04-27 NOTE — Telephone Encounter (Signed)
Attempted to call patient regarding upcoming cardiac CT appointment. °Left message on voicemail with name and callback number ° °Ciera Beckum RN Navigator Cardiac Imaging °[facility] °[phone] Office °[phone] Cell ° °

## 2021-04-28 ENCOUNTER — Encounter (HOSPITAL_COMMUNITY): Payer: Self-pay

## 2021-04-28 ENCOUNTER — Observation Stay (HOSPITAL_COMMUNITY): Payer: Medicare Other

## 2021-04-28 ENCOUNTER — Other Ambulatory Visit: Payer: Self-pay

## 2021-04-28 ENCOUNTER — Observation Stay (HOSPITAL_COMMUNITY)
Admission: EM | Admit: 2021-04-28 | Discharge: 2021-04-29 | Disposition: A | Discharge status: Home / Self Care | Payer: Medicare Other | Attending: Internal Medicine | Admitting: Internal Medicine

## 2021-04-28 ENCOUNTER — Emergency Department (HOSPITAL_COMMUNITY): Payer: Medicare Other

## 2021-04-28 ENCOUNTER — Ambulatory Visit (HOSPITAL_COMMUNITY): Admission: RE | Admit: 2021-04-28 | Payer: Medicare Other | Source: Ambulatory Visit

## 2021-04-28 DIAGNOSIS — R079 Chest pain, unspecified: Secondary | ICD-10-CM | POA: Diagnosis present

## 2021-04-28 DIAGNOSIS — Z20822 Contact with and (suspected) exposure to covid-19: Secondary | ICD-10-CM | POA: Insufficient documentation

## 2021-04-28 DIAGNOSIS — Z7984 Long term (current) use of oral hypoglycemic drugs: Secondary | ICD-10-CM | POA: Diagnosis not present

## 2021-04-28 DIAGNOSIS — R0789 Other chest pain: Secondary | ICD-10-CM | POA: Diagnosis not present

## 2021-04-28 DIAGNOSIS — E1140 Type 2 diabetes mellitus with diabetic neuropathy, unspecified: Secondary | ICD-10-CM | POA: Diagnosis not present

## 2021-04-28 DIAGNOSIS — N3000 Acute cystitis without hematuria: Secondary | ICD-10-CM | POA: Insufficient documentation

## 2021-04-28 DIAGNOSIS — Z9989 Dependence on other enabling machines and devices: Secondary | ICD-10-CM

## 2021-04-28 DIAGNOSIS — I5032 Chronic diastolic (congestive) heart failure: Secondary | ICD-10-CM | POA: Insufficient documentation

## 2021-04-28 DIAGNOSIS — E785 Hyperlipidemia, unspecified: Secondary | ICD-10-CM | POA: Diagnosis present

## 2021-04-28 DIAGNOSIS — N1831 Chronic kidney disease, stage 3a: Secondary | ICD-10-CM | POA: Diagnosis not present

## 2021-04-28 DIAGNOSIS — E1142 Type 2 diabetes mellitus with diabetic polyneuropathy: Secondary | ICD-10-CM | POA: Diagnosis present

## 2021-04-28 DIAGNOSIS — N179 Acute kidney failure, unspecified: Secondary | ICD-10-CM | POA: Diagnosis not present

## 2021-04-28 DIAGNOSIS — I130 Hypertensive heart and chronic kidney disease with heart failure and stage 1 through stage 4 chronic kidney disease, or unspecified chronic kidney disease: Secondary | ICD-10-CM | POA: Insufficient documentation

## 2021-04-28 DIAGNOSIS — G4733 Obstructive sleep apnea (adult) (pediatric): Secondary | ICD-10-CM

## 2021-04-28 DIAGNOSIS — R55 Syncope and collapse: Principal | ICD-10-CM | POA: Insufficient documentation

## 2021-04-28 DIAGNOSIS — Z79899 Other long term (current) drug therapy: Secondary | ICD-10-CM | POA: Insufficient documentation

## 2021-04-28 DIAGNOSIS — R0989 Other specified symptoms and signs involving the circulatory and respiratory systems: Secondary | ICD-10-CM | POA: Diagnosis present

## 2021-04-28 DIAGNOSIS — N390 Urinary tract infection, site not specified: Secondary | ICD-10-CM | POA: Diagnosis present

## 2021-04-28 DIAGNOSIS — Z7982 Long term (current) use of aspirin: Secondary | ICD-10-CM | POA: Insufficient documentation

## 2021-04-28 DIAGNOSIS — I2510 Atherosclerotic heart disease of native coronary artery without angina pectoris: Secondary | ICD-10-CM | POA: Insufficient documentation

## 2021-04-28 DIAGNOSIS — I10 Essential (primary) hypertension: Secondary | ICD-10-CM | POA: Diagnosis present

## 2021-04-28 DIAGNOSIS — D72829 Elevated white blood cell count, unspecified: Secondary | ICD-10-CM | POA: Insufficient documentation

## 2021-04-28 DIAGNOSIS — E7800 Pure hypercholesterolemia, unspecified: Secondary | ICD-10-CM

## 2021-04-28 LAB — HEPATIC FUNCTION PANEL
ALT: 38 U/L (ref 0–44)
AST: 51 U/L — ABNORMAL HIGH (ref 15–41)
Albumin: 3.6 g/dL (ref 3.5–5.0)
Alkaline Phosphatase: 79 U/L (ref 38–126)
Bilirubin, Direct: 0.1 mg/dL (ref 0.0–0.2)
Indirect Bilirubin: 0.8 mg/dL (ref 0.3–0.9)
Total Bilirubin: 0.9 mg/dL (ref 0.3–1.2)
Total Protein: 8.1 g/dL (ref 6.5–8.1)

## 2021-04-28 LAB — BASIC METABOLIC PANEL
Anion gap: 14 (ref 5–15)
BUN: 44 mg/dL — ABNORMAL HIGH (ref 8–23)
CO2: 23 mmol/L (ref 22–32)
Calcium: 9.5 mg/dL (ref 8.9–10.3)
Chloride: 99 mmol/L (ref 98–111)
Creatinine, Ser: 1.46 mg/dL — ABNORMAL HIGH (ref 0.44–1.00)
GFR, Estimated: 36 mL/min — ABNORMAL LOW (ref >60)
Glucose, Bld: 243 mg/dL — ABNORMAL HIGH (ref 70–99)
Potassium: 3.8 mmol/L (ref 3.5–5.1)
Sodium: 136 mmol/L (ref 135–145)

## 2021-04-28 LAB — CBG MONITORING, ED: Glucose-Capillary: 223 mg/dL — ABNORMAL HIGH (ref 70–99)

## 2021-04-28 LAB — URINALYSIS, ROUTINE W REFLEX MICROSCOPIC
Bilirubin Urine: NEGATIVE
Glucose, UA: >=500 mg/dL — AB
Ketones, ur: NEGATIVE mg/dL
Nitrite: NEGATIVE
Protein, ur: NEGATIVE mg/dL
Specific Gravity, Urine: 1.013 (ref 1.005–1.030)
WBC, UA: >50 WBC/hpf — ABNORMAL HIGH (ref 0–5)
pH: 5 (ref 5.0–8.0)

## 2021-04-28 LAB — CBC
HCT: 38.3 % (ref 36.0–46.0)
Hemoglobin: 12.4 g/dL (ref 12.0–15.0)
MCH: 27.2 pg (ref 26.0–34.0)
MCHC: 32.4 g/dL (ref 30.0–36.0)
MCV: 84 fL (ref 80.0–100.0)
Platelets: 273 10*3/uL (ref 150–400)
RBC: 4.56 MIL/uL (ref 3.87–5.11)
RDW: 16.3 % — ABNORMAL HIGH (ref 11.5–15.5)
WBC: 17.3 10*3/uL — ABNORMAL HIGH (ref 4.0–10.5)
nRBC: 0 % (ref 0.0–0.2)

## 2021-04-28 LAB — TROPONIN I (HIGH SENSITIVITY): Troponin I (High Sensitivity): 10 ng/L (ref <18)

## 2021-04-28 LAB — LIPASE, BLOOD: Lipase: 52 U/L — ABNORMAL HIGH (ref 11–51)

## 2021-04-28 LAB — PHOSPHORUS: Phosphorus: 4.5 mg/dL (ref 2.5–4.6)

## 2021-04-28 LAB — MAGNESIUM: Magnesium: 1.9 mg/dL (ref 1.7–2.4)

## 2021-04-28 LAB — TSH: TSH: 1.672 u[IU]/mL (ref 0.350–4.500)

## 2021-04-28 MED ORDER — ACETAMINOPHEN 325 MG PO TABS: 650.0000 mg | ORAL_TABLET | Freq: Four times a day (QID) | ORAL | Status: DC | PRN

## 2021-04-28 MED ORDER — SODIUM CHLORIDE (PF) 0.9 % IJ SOLN
INTRAMUSCULAR | Status: AC
Start: 2021-04-28 — End: 2021-04-28
  Filled 2021-04-28: qty 50

## 2021-04-28 MED ORDER — ALLOPURINOL 300 MG PO TABS
300.0000 mg | ORAL_TABLET | Freq: Every day | ORAL | Status: DC
  Administered 2021-04-29: 300 mg via ORAL
  Filled 2021-04-28: qty 1

## 2021-04-28 MED ORDER — TRAMADOL HCL 50 MG PO TABS: 50.0000 mg | ORAL_TABLET | Freq: Four times a day (QID) | ORAL | Status: DC | PRN

## 2021-04-28 MED ORDER — GABAPENTIN 300 MG PO CAPS
600.0000 mg | ORAL_CAPSULE | Freq: Three times a day (TID) | ORAL | Status: DC
Start: 2021-04-28 — End: 2021-04-30
  Administered 2021-04-29 (×3): 600 mg via ORAL
  Filled 2021-04-28 (×3): qty 2

## 2021-04-28 MED ORDER — METOPROLOL TARTRATE 25 MG PO TABS
25.0000 mg | ORAL_TABLET | Freq: Two times a day (BID) | ORAL | Status: DC
Start: 2021-04-28 — End: 2021-04-30
  Administered 2021-04-29 (×2): 25 mg via ORAL
  Filled 2021-04-28 (×2): qty 1

## 2021-04-28 MED ORDER — DOCUSATE SODIUM 100 MG PO CAPS
100.0000 mg | ORAL_CAPSULE | Freq: Two times a day (BID) | ORAL | Status: DC
  Administered 2021-04-29 (×2): 100 mg via ORAL
  Filled 2021-04-28 (×2): qty 1

## 2021-04-28 MED ORDER — SODIUM CHLORIDE 0.9 % IV SOLN: 75.0000 mL/h | INTRAVENOUS | Status: DC

## 2021-04-28 MED ORDER — IOHEXOL 300 MG/ML  SOLN
80.0000 mL | Freq: Once | INTRAMUSCULAR | Status: AC | PRN
  Administered 2021-04-28: 80 mL via INTRAVENOUS

## 2021-04-28 MED ORDER — PANTOPRAZOLE SODIUM 40 MG PO TBEC
40.0000 mg | DELAYED_RELEASE_TABLET | Freq: Every day | ORAL | Status: DC
  Administered 2021-04-29: 40 mg via ORAL
  Filled 2021-04-28: qty 1

## 2021-04-28 MED ORDER — SODIUM CHLORIDE 0.9 % IV SOLN
1.0000 g | Freq: Once | INTRAVENOUS | Status: AC
  Administered 2021-04-28: 1 g via INTRAVENOUS
  Filled 2021-04-28: qty 10

## 2021-04-28 MED ORDER — ASPIRIN EC 81 MG PO TBEC
81.0000 mg | DELAYED_RELEASE_TABLET | Freq: Every day | ORAL | Status: DC
  Administered 2021-04-29: 81 mg via ORAL
  Filled 2021-04-28: qty 1

## 2021-04-28 MED ORDER — SODIUM CHLORIDE 0.9 % IV BOLUS (SEPSIS)
1000.0000 mL | Freq: Once | INTRAVENOUS | Status: AC
  Administered 2021-04-28: 1000 mL via INTRAVENOUS

## 2021-04-28 MED ORDER — ATORVASTATIN CALCIUM 10 MG PO TABS
10.0000 mg | ORAL_TABLET | Freq: Every day | ORAL | Status: DC
Start: 2021-04-29 — End: 2021-04-30
  Administered 2021-04-29: 10 mg via ORAL
  Filled 2021-04-28: qty 1

## 2021-04-28 MED ORDER — SENNA 8.6 MG PO TABS
1.0000 | ORAL_TABLET | Freq: Two times a day (BID) | ORAL | Status: DC
  Administered 2021-04-29 (×2): 8.6 mg via ORAL
  Filled 2021-04-28 (×2): qty 1

## 2021-04-28 MED ORDER — INSULIN ASPART 100 UNIT/ML IJ SOLN
0.0000 [IU] | INTRAMUSCULAR | Status: DC
  Administered 2021-04-29 (×3): 2 [IU] via SUBCUTANEOUS
  Administered 2021-04-29: 3 [IU] via SUBCUTANEOUS
  Filled 2021-04-28: qty 0.09

## 2021-04-28 MED ORDER — SODIUM CHLORIDE 0.9 % IV SOLN
2.0000 g | INTRAVENOUS | Status: DC
  Filled 2021-04-28: qty 20

## 2021-04-28 MED ORDER — ACETAMINOPHEN 650 MG RE SUPP: 650.0000 mg | Freq: Four times a day (QID) | RECTAL | Status: DC | PRN

## 2021-04-28 MED ORDER — SODIUM CHLORIDE 0.9 % IV SOLN: 1000.0000 mL | INTRAVENOUS | Status: DC

## 2021-04-28 MED ORDER — METOPROLOL TARTRATE 25 MG PO TABS: 25.0000 mg | ORAL_TABLET | Freq: Two times a day (BID) | ORAL | Status: DC

## 2021-04-28 NOTE — H&P (Signed)
? ? Nancy [name] PXT:[id number] DOB: 1940-05-12 DOA: 04/28/2021 ? ? ?  ?PCP: Buzzy Han, MD   ?Outpatient Specialists:   ?CARDS:  Dr. Golden Hurter ?  ?GI  Dr.Outlaw,   Sadie Haber ) ?   ? ?Patient arrived to ER on 04/28/21 at 1236 ?Referred by Attending Dorie Rank, MD ? ? ?Patient coming from:   ? home Lives a With family ?  ? ?Chief Complaint: syncope ? ? ?HPI: ?Nancy [name] is a 81 y.o. female with medical history significant of CAD, HTN, HLD, OSA on CPAP, Pulmonary HTN, Chest pain, CKD 3a, GERD, DM2 ?  ? ?Presented with   syncope ?Patient had a syncopal event today while sitting on toilet.  Per family she passed out for about 1 minute and did not fall remaining until noon when EMS arrived with still sitting there.  Initially feeling fairly fatigued blood pressure 86/50 given normal saline 250 bolus and blood pressure improved to 106/50 ?Patient has known history of coronary artery disease and diastolic dysfunction followed by Golden Hurter.  Also has known history of mild pulmonary hypertension ?Patient reports she still gets occasional pain in the middle sensation of breath or left-sided chest pain for which she was supposed to get it cardiac CT scan done with Dr. Radford Pax ?She has been somewhat constipated lately and drank coffee today hoping that they will help her go to the bathroom when she drinks coffee she felt some abdominal spasm and went to the restroom to have a BM while sitting in the commode she started to feel hot sweaty and lightheaded ?He may have passed out for a brief moment her daughter was nearby and started to push on her chest and breathe into her mouth she very quickly came around.  Never completely fallen off of the commode never hit her head.  No chest pain associated that ?She has chronic mild shortness of breath that is unchanged. ?Denies any recent fevers or chills she has occasional mild dysuria ?Reports she occasionally feels jittery reports chronic epigastric pain  for which she is planning to see GI.  Is supposed to undergo Endoscopy ?  ?Regarding pertinent Chronic problems:   ? ? Hyperlipidemia - *on statins ?Lipid Panel  ?   ?Component Value Date/Time  ? CHOL 124 01/28/2021 1036  ? CHOL 99 (L) 12/05/2019 0813  ? TRIG 104.0 01/28/2021 1036  ? HDL 32.40 (L) 01/28/2021 1036  ? HDL 31 (L) 12/05/2019 0813  ? CHOLHDL 4 01/28/2021 1036  ? VLDL 20.8 01/28/2021 1036  ? [place] 71 01/28/2021 1036  ? [place] 45 12/05/2019 0813  ? LABVLDL 23 12/05/2019 0813  ? ? ? HTN on amlodipine 5 mg daily, losartan 50 mg daily, Lopressor 25 mg twice daily  ? ?Pulmonary HTN -echo 2019 with no pulmonary HTN  ?- PASP 54mHg and no PHTN on echo 11/2019 ? ? chronic CHF diastolic/systolic/ combined - last echo11/24/2021 EF 60 to 65% ?Left ventricular diastolic parameters are indeterminate ?There is normal pulmonary artery systolic pressure ? ? mild pulmonary hypertension by 2D echocardiogram in July 2013.  She had a Lexiscan Myoview for chest pain 10/13/2016 which showed no inducible ischemia. 2D echo in 2019 showed  normal LVF with EF 60-65% with moderate LVH and elevated filling pressures ? ? minimal CAD  - On Aspirin, statin, betablocker, Plavix  ?               -  followed by cardiology ?  coronary CTA was done which showed minimal CAD of the LM, mid and distal LAD and her CP was felt to be non cardiac in origin. 2D echo was normal with severe ASSH, no PHTN and cardiac MRI was done showing normal LVF with EF 73%, moderate BSH, normal RV and no evidence of cardiac amyloid or HOCM and findings c/w hypertensive heart disease.  ? ? ?  DM 2 -  ?Lab Results  ?Component Value Date  ? HGBA1C 7.8 (A) 01/28/2021  ?  ?[place] 09/24/2020, metformin , januvia ?  ?obesity-   ?BMI Readings from Last 1 Encounters:  ?04/28/21 32.37 kg/m?  ? ?    ?  OSA -on nocturnal CPAP,   ?  ? CKD stage IIIa- baseline Cr 1.0 ?Estimated Creatinine Clearance: 30.7 mL/min (A) (by C-G formula based on SCr of 1.46 mg/dL  (H)). ? ?Lab Results  ?Component Value Date  ? CREATININE 1.46 (H) 04/28/2021  ? CREATININE 0.99 04/14/2021  ? CREATININE 1.33 (H) 01/28/2021  ?  ? ?While in ER: ?Clinical Course as of 04/28/21 2214  ?Tue Apr 28, 2021  ?1844 CBC(!) ?Wbc elevated [JK]  ?1224 Basic metabolic panel(!) ?Creatinine elevated  [JK]  ?2111 CT ABDOMEN PELVIS W CONTRAST ?CT scan without acute findings [JK]  ?2111 Urinalysis, Routine w reflex microscopic(!) ?Urinalysis is consistent with urinary tract infection [JK]  ?2155 Discussed with Dr Roel Cluck [JK]  ?  ?Clinical Course User Index ?[JK] Dorie Rank, MD  ? ?  ? ?Ordered ?   ?CXR -  NON acute ? ?CTabd/pelvis -  nonacute ?  ? ?Following Medications were ordered in ER: ?Medications  ?sodium chloride 0.9 % bolus 1,000 mL ( Intravenous Restarted 04/28/21 2119)  ?  Followed by  ?0.9 %  sodium chloride infusion (has no administration in time range)  ?cefTRIAXone (ROCEPHIN) 1 g in sodium chloride 0.9 % 100 mL IVPB (has no administration in time range)  ?iohexol (OMNIPAQUE) 300 MG/ML solution 80 mL (80 mLs Intravenous Contrast Given 04/28/21 1920)  ?sodium chloride (PF) 0.9 % injection (  Given 04/28/21 1922)  ?   ?ED Triage Vitals  ?Enc Vitals Group  ?   BP 04/28/21 1249 (!) 115/51  ?   Pulse Rate 04/28/21 1249 74  ?   Resp 04/28/21 1249 16  ?   Temp 04/28/21 1249 98.5 ?F (36.9 ?C)  ?   Temp Source 04/28/21 1249 Oral  ?   SpO2 04/28/21 1249 94 %  ?   Weight 04/28/21 1304 177 lb (80.3 kg)  ?   Height 04/28/21 1304 '5\' 2"'$  (1.575 m)  ?   Head Circumference --   ?   Peak Flow --   ?   Pain Score 04/28/21 1303 3  ?   Pain Loc --   ?   Pain Edu? --   ?   Excl. in [place]? --   ?MGNO(03)@    ? _________________________________________ ?Significant initial  Findings: ?Abnormal Labs Reviewed  ?BASIC METABOLIC PANEL - Abnormal; Notable for the following components:  ?    Result Value  ? Glucose, Bld 243 (*)   ? BUN 44 (*)   ? Creatinine, Ser 1.46 (*)   ? GFR, Estimated 36 (*)   ? All other components within normal  limits  ?CBC - Abnormal; Notable for the following components:  ? WBC 17.3 (*)   ? RDW 16.3 (*)   ? All other components within normal limits  ?URINALYSIS, ROUTINE W REFLEX MICROSCOPIC - Abnormal;  Notable for the following components:  ? APPearance CLOUDY (*)   ? Glucose, UA >=500 (*)   ? Hgb urine dipstick SMALL (*)   ? Leukocytes,Ua LARGE (*)   ? WBC, UA >50 (*)   ? Bacteria, UA MANY (*)   ? All other components within normal limits  ?CBG MONITORING, ED - Abnormal; Notable for the following components:  ? Glucose-Capillary 223 (*)   ? All other components within normal limits  ? ?  ?_________________________ ?Troponin 10 ?ECG: Ordered ?Personally reviewed by me showing: ?HR : 76 ?Rhythm: Sinus rhythm ?Baseline wander in lead(s) I III aVL ?No significant change since last tracing ?QTC 461 ?  ? ?The recent clinical data is shown below. ?Vitals:  ? 04/28/21 2000 04/28/21 2015 04/28/21 2030 04/28/21 2045  ?BP:      ?Pulse: 80 74 77 75  ?Resp: '18 15 16 14  '$ ?Temp:      ?TempSrc:      ?SpO2: 100% 100% 100% 99%  ?Weight:      ?Height:      ?  ?  ?WBC ? ?   ?Component Value Date/Time  ? WBC 17.3 (H) 04/28/2021 1351  ? LYMPHSABS 0.7 03/16/2016 0420  ? MONOABS 0.5 03/16/2016 0420  ? EOSABS 0.0 03/16/2016 0420  ? BASOSABS 0.0 03/16/2016 0420  ? ?   ? UA  evidence of UTI   ?  ?Urine analysis: ?   ?Component Value Date/Time  ? [place] YELLOW 04/28/2021 1941  ? APPEARANCEUR CLOUDY (A) 04/28/2021 1941  ? LABSPEC 1.013 04/28/2021 1941  ? PHURINE 5.0 04/28/2021 1941  ? GLUCOSEU >=500 (A) 04/28/2021 1941  ? HGBUR SMALL (A) 04/28/2021 1941  ? [place] NEGATIVE 04/28/2021 1941  ? [place] NEGATIVE 04/28/2021 1941  ? [place] NEGATIVE 04/28/2021 1941  ? UROBILINOGEN 1.0 12/10/2006 0637  ? NITRITE NEGATIVE 04/28/2021 1941  ? LEUKOCYTESUR LARGE (A) 04/28/2021 1941  ?  ?_______________________________________________ ?Hospitalist was called for admission for   Syncope,  ?Acute cystitis without hematuria  ?AKI (acute kidney injury)  ([place]) ?   ?The following Work up has been ordered so far: ? ?Orders Placed This Encounter  ?Procedures  ? CT ABDOMEN PELVIS W CONTRAST  ? Basic metabolic panel  ? CBC  ? Urinalysis, Routine w reflex microscop

## 2021-04-28 NOTE — ED Provider Triage Note (Signed)
Emergency Medicine Provider Triage Evaluation Note ? ?Nancy [name] , a 81 y.o. female  was evaluated in triage.  Pt complains of syncopal episode that occurred earlier today.  Patient states that she had some lower abdominal cramping and decided she needed to go to the bathroom.  She was on the toilet having a bowel movement when she felt like she was going to pass out.  She does endorse that she passed out for couple of minutes however was able to remain on the toilet.  Her initial blood pressure with EMS was 86/50.  She was given 250 cc of normal saline in route and blood pressure improved to 106/50.  Here in the ED blood pressure 115/51.  Patient currently complains of some upper back pain however attributes it to the uncomfortable wheelchair she is in.  Otherwise she has no complaints. ? ?Review of Systems  ?Positive: + syncope ?Negative: - fall ? ?Physical Exam  ?BP (!) 115/51 (BP Location: Left Arm)   Pulse 74   Temp 98.5 ?F (36.9 ?C) (Oral)   Resp 16   Ht '5\' 2"'$  (1.575 m)   Wt 80.3 kg   SpO2 94%   BMI 32.37 kg/m?  ?Gen:   Awake, no distress   ?Resp:  Normal effort  ?MSK:   Moves extremities without difficulty  ?Other:   ? ?Medical Decision Making  ?Medically screening exam initiated at 1:14 PM.  Appropriate orders placed.  Nancy [name] was informed that the remainder of the evaluation will be completed by another provider, this initial triage assessment does not replace that evaluation, and the importance of remaining in the ED until their evaluation is complete. ? ? ?  ?Eustaquio Maize, PA-C ?04/28/21 1315 ? ?

## 2021-04-28 NOTE — Assessment & Plan Note (Signed)
Allow permissive hypertension for tonight 

## 2021-04-28 NOTE — Assessment & Plan Note (Signed)
A typical chronic EKG nonischemic troponin unremarkable continue to cycle cardiac enzymes if abnormal work-up would benefit from cardiology consult ?

## 2021-04-28 NOTE — Assessment & Plan Note (Signed)
-   treat with Rocephin         await results of urine culture and adjust antibiotic coverage as needed  

## 2021-04-28 NOTE — ED Provider Notes (Signed)
?[facility] DEPT ?Provider Note ? ? ?CSN: [id number] ?Arrival date & time: 04/28/21  1236 ? ?  ? ?History ? ?No chief complaint on file. ? ? ?Nancy [name] is a 81 y.o. female. ? ?HPI ? ? Pt started having some mild abd discomfort yesterday.  She felt mildy constipated.  She had a bowel movement on Sunday.  She did have another one today.  WHile pt was using the commode she felt lightheaded as if she was going to pass out.  Family tried to give her something in case her sugar was low but then she had a syncopal episode.  Family had to call 911, they did do CPR briefly..  It lasted several minutes.  EMS was called and her BP was low in the 74B systolic.  EMS gave the patient IV fluids.  BP improved. ? ?Home Medications ?Prior to Admission medications   ?Medication Sig Start Date End Date Taking? Authorizing Provider  ?allopurinol (ZYLOPRIM) 300 MG tablet Take 300 mg by mouth daily.  10/07/18   [provider]  ?amLODipine (NORVASC) 5 MG tablet Take 5 mg by mouth daily. 10/29/19   [provider]  ?aspirin EC 81 MG tablet Take 81 mg by mouth daily.    [provider]  ?atorvastatin (LIPITOR) 10 MG tablet Take 1 tablet (10 mg total) by mouth daily. Please make overdue appt with Dr.Turner before anymore refills. Thank you 1st attempt 03/17/21   Sueanne Margarita, MD  ?benzonatate (TESSALON) 100 MG capsule Take 100 mg by mouth 3 (three) times daily as needed. 08/06/20   [provider]  ?Blood Glucose Monitoring Suppl (ONETOUCH VERIO IQ SYSTEM) w/Device KIT 1 kit by Does not apply route 2 (two) times daily. E11.65 08/24/18   Shamleffer, Melanie Crazier, MD  ?colchicine 0.6 MG tablet Take 0.6 mg by mouth daily as needed (Gout).    [provider]  ?dapagliflozin propanediol (FARXIGA) 10 MG TABS tablet Take 1 tablet (10 mg total) by mouth daily before breakfast. 02/12/21   Shamleffer, Melanie Crazier, MD  ?DUREZOL 0.05 % EMUL 1-2 drops as directed. For  dry eyes 05/18/16   [provider]  ?famotidine (PEPCID) 20 MG tablet Take 20 mg by mouth 2 (two) times daily. 01/27/21   [provider]  ?furosemide (LASIX) 20 MG tablet Take 1 tablet (20 mg total) by mouth every other day. 01/24/19   Sueanne Margarita, MD  ?gabapentin (NEURONTIN) 300 MG capsule Take 600 mg by mouth 3 (three) times daily. 05/05/16   [provider]  ?glucose blood test strip Use as instructed to test blood sugars 2 times daily E11.65 08/24/18   Shamleffer, Melanie Crazier, MD  ?losartan (COZAAR) 50 MG tablet Take 50 mg by mouth daily. 12/30/15   [provider]  ?metFORMIN (GLUCOPHAGE) 1000 MG tablet Take 1 tablet (1,000 mg total) by mouth daily with breakfast. 01/28/21   Shamleffer, Melanie Crazier, MD  ?metoprolol tartrate (LOPRESSOR) 50 MG tablet Take 0.5 tablets (25 mg total) by mouth 2 (two) times daily. 11/13/19 04/14/21  Charlie Pitter, PA-C  ?Multiple Vitamins tablet Take 1 tablet by mouth daily. 07/13/19   [provider]  ?mupirocin ointment (BACTROBAN) 2 % Apply 1 application topically daily. 07/28/20   Lavonna Monarch, MD  ?nirmatrelvir & ritonavir (PAXLOVID) 10 x 150 MG & 10 x 100MG TBPK Take 1 tablet by mouth as directed. 08/06/20   [provider]  ?omeprazole (PRILOSEC) 20 MG capsule Take 20 mg  by mouth as needed.     [provider]  ?potassium chloride SA (KLOR-CON) 20 MEQ tablet Take 20 mEq by mouth daily. 06/08/20   [provider]  ?sitaGLIPtin (JANUVIA) 50 MG tablet Take 1 tablet (50 mg total) by mouth daily. 01/29/21   Shamleffer, Melanie Crazier, MD  ?sucralfate (CARAFATE) 1 GM/10ML suspension SMARTSIG:Milliliter(s) By Mouth 01/16/21   [provider]  ?tiZANidine (ZANAFLEX) 4 MG tablet Take 4 mg by mouth 2 (two) times daily as needed. 11/24/20   [provider]  ?traMADol (ULTRAM) 50 MG tablet Take 50 mg by mouth every 6 (six) hours as needed. 12/09/20   [provider]  ?vitamin B-12  (CYANOCOBALAMIN) 1000 MCG tablet Take 1,000 mcg by mouth daily. 07/13/19   [provider]  ?   ? ?Allergies    ?Morphine, Toradol [ketorolac tromethamine], and Codeine   ? ?Review of Systems   ?Review of Systems  ?Constitutional:  Negative for fever.  ? ?Physical Exam ?Updated Vital Signs ?BP 125/66 (BP Location: Left Arm)   Pulse 75   Temp 98.5 ?F (36.9 ?C) (Oral)   Resp 14   Ht 1.575 m (5' 2" )   Wt 80.3 kg   SpO2 99%   BMI 32.37 kg/m?  ?Physical Exam ?Vitals and nursing note reviewed.  ?Constitutional:   ?   General: She is not in acute distress. ?   Appearance: She is well-developed.  ?HENT:  ?   Head: Normocephalic and atraumatic.  ?   Right Ear: External ear normal.  ?   Left Ear: External ear normal.  ?Eyes:  ?   General: No scleral icterus.    ?   Right eye: No discharge.     ?   Left eye: No discharge.  ?   Conjunctiva/sclera: Conjunctivae normal.  ?Neck:  ?   Trachea: No tracheal deviation.  ?Cardiovascular:  ?   Rate and Rhythm: Normal rate and regular rhythm.  ?Pulmonary:  ?   Effort: Pulmonary effort is normal. No respiratory distress.  ?   Breath sounds: Normal breath sounds. No stridor. No wheezing or rales.  ?Abdominal:  ?   General: Bowel sounds are normal. There is no distension.  ?   Palpations: Abdomen is soft.  ?   Tenderness: There is abdominal tenderness in the suprapubic area. There is no guarding or rebound.  ?Musculoskeletal:     ?   General: No tenderness or deformity.  ?   Cervical back: Neck supple.  ?Skin: ?   General: Skin is warm and dry.  ?   Findings: No rash.  ?Neurological:  ?   General: No focal deficit present.  ?   Mental Status: She is alert.  ?   Cranial Nerves: No cranial nerve deficit (no facial droop, extraocular movements intact, no slurred speech).  ?   Sensory: No sensory deficit.  ?   Motor: No abnormal muscle tone or seizure activity.  ?   Coordination: Coordination normal.  ?Psychiatric:     ?   Mood and Affect: Mood normal.  ? ? ?ED Results /  Procedures / Treatments   ?Labs ?(all labs ordered are listed, but only abnormal results are displayed) ?Labs Reviewed  ?BASIC METABOLIC PANEL - Abnormal; Notable for the following components:  ?    Result Value  ? Glucose, Bld 243 (*)   ? BUN 44 (*)   ? Creatinine, Ser 1.46 (*)   ? GFR, Estimated 36 (*)   ? All  other components within normal limits  ?CBC - Abnormal; Notable for the following components:  ? WBC 17.3 (*)   ? RDW 16.3 (*)   ? All other components within normal limits  ?URINALYSIS, ROUTINE W REFLEX MICROSCOPIC - Abnormal; Notable for the following components:  ? APPearance CLOUDY (*)   ? Glucose, UA >=500 (*)   ? Hgb urine dipstick SMALL (*)   ? Leukocytes,Ua LARGE (*)   ? WBC, UA >50 (*)   ? Bacteria, UA MANY (*)   ? All other components within normal limits  ?CBG MONITORING, ED - Abnormal; Notable for the following components:  ? Glucose-Capillary 223 (*)   ? All other components within normal limits  ?HEPATIC FUNCTION PANEL  ?LIPASE, BLOOD  ?CBG MONITORING, ED  ?TROPONIN I (HIGH SENSITIVITY)  ? ? ?EKG ?EKG Interpretation ? ?Date/Time:  Tuesday April 28 2021 13:15:51 EDT ?Ventricular Rate:  76 ?PR Interval:  155 ?QRS Duration: 83 ?QT Interval:  410 ?QTC Calculation: 461 ?R Axis:   -14 ?Text Interpretation: Sinus rhythm Baseline wander in lead(s) I III aVL No significant change since last tracing Confirmed by Dorie Rank [phone]) on 04/28/2021 6:45:39 PM ? ?Radiology ?CT ABDOMEN PELVIS W CONTRAST ? ?Result Date: 04/28/2021 ?CLINICAL DATA:  Abdominal pain, acute, nonlocalized EXAM: CT ABDOMEN AND PELVIS WITH CONTRAST TECHNIQUE: Multidetector CT imaging of the abdomen and pelvis was performed using the standard protocol following bolus administration of intravenous contrast. RADIATION DOSE REDUCTION: This exam was performed according to the departmental dose-optimization program which includes automated exposure control, adjustment of the mA and/or kV according to patient size and/or use of iterative  reconstruction technique. CONTRAST:  69m OMNIPAQUE IOHEXOL 300 MG/ML  SOLN COMPARISON:  None FINDINGS: Lower chest: No acute abnormality Hepatobiliary: No focal liver abnormality is seen. Status post cholecystectomy. No biliary dila

## 2021-04-28 NOTE — Assessment & Plan Note (Signed)
Echo cardiac enzymes obtain echogram obtain EKG orthostatics prior to discharge and gently rehydrate. ?

## 2021-04-28 NOTE — Assessment & Plan Note (Signed)
slightly on the dry side, gently rehydrate ?

## 2021-04-28 NOTE — Assessment & Plan Note (Signed)
Latest echo showed no evidence of pulmonary HTN ?

## 2021-04-28 NOTE — Assessment & Plan Note (Signed)
continue lipitor, check lipid panel ?

## 2021-04-28 NOTE — ED Notes (Signed)
Call received from [facility]- recollection needed on light green tube. RN advised. ENMiles ?

## 2021-04-28 NOTE — Assessment & Plan Note (Signed)
Continue CPAP.  

## 2021-04-28 NOTE — Assessment & Plan Note (Signed)
-   Order Sensitive  SSI     -  check TSH and HgA1C  - Hold by mouth medications*  

## 2021-04-28 NOTE — ED Triage Notes (Addendum)
Per EMS-was sitting on toilet and had a syncopal episode-family states she was "out" for 1 minute or 2-did not fall, remained on toilet-oriented when EMS arrived-complaining of fatigue-initial BP 86/50-22g in right hand-250 cc of NS given in route-BP 106/50 ?

## 2021-04-28 NOTE — Subjective & Objective (Signed)
Patient had a syncopal event today while sitting on toilet.  Per family she passed out for about 1 minute and did not fall remaining until noon when EMS arrived with still sitting there.  Initially feeling fairly fatigued blood pressure 86/50 given normal saline 250 bolus and blood pressure improved to 106/50 ?Patient has known history of coronary artery disease and diastolic dysfunction followed by [facility].  Also has known history of mild pulmonary hypertension ?

## 2021-04-28 NOTE — Assessment & Plan Note (Signed)
Mild stable, continue aspirin ands statin restart bb when BP allows ?

## 2021-04-28 NOTE — Assessment & Plan Note (Signed)
Will rehydrate, obtain urine elctrolytes ?

## 2021-04-29 ENCOUNTER — Ambulatory Visit (HOSPITAL_COMMUNITY)
Admission: EM | Admit: 2021-04-29 | Discharge: 2021-04-29 | Disposition: A | Discharge status: Home / Self Care | Payer: Medicare Other | Source: Ambulatory Visit | Attending: Internal Medicine | Admitting: Internal Medicine

## 2021-04-29 ENCOUNTER — Observation Stay (HOSPITAL_BASED_OUTPATIENT_CLINIC_OR_DEPARTMENT_OTHER): Payer: Medicare Other

## 2021-04-29 ENCOUNTER — Observation Stay (HOSPITAL_COMMUNITY)
Admission: EM | Admit: 2021-04-29 | Discharge: 2021-04-29 | Disposition: A | Discharge status: Home / Self Care | Payer: Medicare Other | Source: Home / Self Care | Attending: Cardiology | Admitting: Cardiology

## 2021-04-29 ENCOUNTER — Other Ambulatory Visit (HOSPITAL_COMMUNITY): Payer: Medicare Other

## 2021-04-29 DIAGNOSIS — N179 Acute kidney failure, unspecified: Secondary | ICD-10-CM | POA: Diagnosis not present

## 2021-04-29 DIAGNOSIS — R55 Syncope and collapse: Secondary | ICD-10-CM

## 2021-04-29 DIAGNOSIS — R072 Precordial pain: Secondary | ICD-10-CM

## 2021-04-29 DIAGNOSIS — I5032 Chronic diastolic (congestive) heart failure: Secondary | ICD-10-CM | POA: Diagnosis not present

## 2021-04-29 DIAGNOSIS — I2510 Atherosclerotic heart disease of native coronary artery without angina pectoris: Secondary | ICD-10-CM | POA: Diagnosis not present

## 2021-04-29 LAB — COMPREHENSIVE METABOLIC PANEL
ALT: 33 U/L (ref 0–44)
AST: 39 U/L (ref 15–41)
Albumin: 3.2 g/dL — ABNORMAL LOW (ref 3.5–5.0)
Alkaline Phosphatase: 77 U/L (ref 38–126)
Anion gap: 9 (ref 5–15)
BUN: 35 mg/dL — ABNORMAL HIGH (ref 8–23)
CO2: 24 mmol/L (ref 22–32)
Calcium: 9.2 mg/dL (ref 8.9–10.3)
Chloride: 107 mmol/L (ref 98–111)
Creatinine, Ser: 1.13 mg/dL — ABNORMAL HIGH (ref 0.44–1.00)
GFR, Estimated: 49 mL/min — ABNORMAL LOW (ref >60)
Glucose, Bld: 106 mg/dL — ABNORMAL HIGH (ref 70–99)
Potassium: 3.6 mmol/L (ref 3.5–5.1)
Sodium: 140 mmol/L (ref 135–145)
Total Bilirubin: 0.9 mg/dL (ref 0.3–1.2)
Total Protein: 7.1 g/dL (ref 6.5–8.1)

## 2021-04-29 LAB — ECHOCARDIOGRAM COMPLETE
Area-P 1/2: 3.65 cm2
Calc EF: 83 %
Height: 62 in
S' Lateral: 1.5 cm
Single Plane A2C EF: 88.3 %
Single Plane A4C EF: 77.4 %
Weight: 2832 oz

## 2021-04-29 LAB — RESP PANEL BY RT-PCR (FLU A&B, COVID) ARPGX2
Influenza A by PCR: NEGATIVE
Influenza B by PCR: NEGATIVE
SARS Coronavirus 2 by RT PCR: NEGATIVE

## 2021-04-29 LAB — CREATININE, URINE, RANDOM: Creatinine, Urine: 95.09 mg/dL

## 2021-04-29 LAB — GLUCOSE, CAPILLARY: Glucose-Capillary: 161 mg/dL — ABNORMAL HIGH (ref 70–99)

## 2021-04-29 LAB — CBC
HCT: 35.2 % — ABNORMAL LOW (ref 36.0–46.0)
Hemoglobin: 11.7 g/dL — ABNORMAL LOW (ref 12.0–15.0)
MCH: 28.2 pg (ref 26.0–34.0)
MCHC: 33.2 g/dL (ref 30.0–36.0)
MCV: 84.8 fL (ref 80.0–100.0)
Platelets: 220 10*3/uL (ref 150–400)
RBC: 4.15 MIL/uL (ref 3.87–5.11)
RDW: 16.3 % — ABNORMAL HIGH (ref 11.5–15.5)
WBC: 12 10*3/uL — ABNORMAL HIGH (ref 4.0–10.5)
nRBC: 0 % (ref 0.0–0.2)

## 2021-04-29 LAB — CBG MONITORING, ED
Glucose-Capillary: 106 mg/dL — ABNORMAL HIGH (ref 70–99)
Glucose-Capillary: 151 mg/dL — ABNORMAL HIGH (ref 70–99)
Glucose-Capillary: 173 mg/dL — ABNORMAL HIGH (ref 70–99)
Glucose-Capillary: 230 mg/dL — ABNORMAL HIGH (ref 70–99)

## 2021-04-29 LAB — OSMOLALITY, URINE: Osmolality, Ur: 408 mOsm/kg (ref 300–900)

## 2021-04-29 LAB — LACTIC ACID, PLASMA
Lactic Acid, Venous: 3.1 mmol/L (ref 0.5–1.9)
Lactic Acid, Venous: 2.2 mmol/L (ref 0.5–1.9)

## 2021-04-29 LAB — SODIUM, URINE, RANDOM: Sodium, Ur: 31 mmol/L

## 2021-04-29 MED ORDER — METOPROLOL TARTRATE 25 MG PO TABS
100.0000 mg | ORAL_TABLET | Freq: Once | ORAL | Status: AC
Start: 2021-04-29 — End: 2021-04-29
  Administered 2021-04-29: 100 mg via ORAL
  Filled 2021-04-29: qty 4

## 2021-04-29 MED ORDER — IOHEXOL 350 MG/ML SOLN
100.0000 mL | Freq: Once | INTRAVENOUS | Status: AC | PRN
  Administered 2021-04-29: 100 mL via INTRAVENOUS

## 2021-04-29 MED ORDER — FUROSEMIDE 20 MG PO TABS
20.0000 mg | ORAL_TABLET | Freq: Every day | ORAL | 3 refills | Status: DC | PRN
Start: 2021-04-29 — End: 2022-08-13

## 2021-04-29 MED ORDER — CEPHALEXIN 500 MG PO CAPS
500.0000 mg | ORAL_CAPSULE | Freq: Two times a day (BID) | ORAL | 0 refills | Status: AC
Start: 2021-04-29 — End: 2021-05-02

## 2021-04-29 MED ORDER — AMLODIPINE BESYLATE 5 MG PO TABS: 5.0000 mg | ORAL_TABLET | Freq: Every day | ORAL | Status: DC

## 2021-04-29 MED ORDER — LOSARTAN POTASSIUM 50 MG PO TABS: 50.0000 mg | ORAL_TABLET | Freq: Every day | ORAL | Status: DC

## 2021-04-29 MED ORDER — SODIUM CHLORIDE 0.9 % IV BOLUS
500.0000 mL | Freq: Once | INTRAVENOUS | Status: AC
  Administered 2021-04-29: 500 mL via INTRAVENOUS

## 2021-04-29 MED ORDER — SODIUM CHLORIDE 0.9 % IV SOLN
INTRAVENOUS | Status: DC
Start: 2021-04-29 — End: 2021-04-30

## 2021-04-29 MED ORDER — DILTIAZEM HCL 25 MG/5ML IV SOLN
5.0000 mg | Freq: Once | INTRAVENOUS | Status: AC
  Administered 2021-04-29: 5 mg via INTRAVENOUS

## 2021-04-29 MED ORDER — NITROGLYCERIN 0.4 MG SL SUBL
0.8000 mg | SUBLINGUAL_TABLET | Freq: Once | SUBLINGUAL | Status: AC
  Administered 2021-04-29: 0.8 mg via SUBLINGUAL

## 2021-04-29 NOTE — Progress Notes (Signed)
2D echo reviewed : ?IMPRESSIONS  ? ? 1. Left ventricular ejection fraction, by estimation, is 60 to 65%. The  ?left ventricle has normal function. The left ventricle has no regional  ?wall motion abnormalities. 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 Left ventricular diastolic parameters were normal. The average  ?left ventricular global longitudinal strain is -27.9 %. The global  ?longitudinal strain is normal.  ? 2. Right ventricular systolic function is normal. The right ventricular  ?size is normal.  ? 3. The mitral valve is normal in structure. No evidence of mitral valve  ?regurgitation. No evidence of mitral stenosis. Moderate mitral annular  ?calcification.  ? 4. The aortic valve is tricuspid. There is mild calcification of the  ?aortic valve. There is mild thickening of the aortic valve. Aortic valve  ?regurgitation is not visualized. Aortic valve sclerosis is present, with  ?no evidence of aortic valve stenosis.  ? 5. The inferior vena cava is normal in size with greater than 50%  ?respiratory variability, suggesting right atrial pressure of 3 mmHg.  ? ?Coronary CTA with nonobstructive CAD>>noncardiac CP ? ?Normal orthostatic BPs.  Syncope c/w vasovagal event.  No further workup at this time.  If she has another episode of syncope will consider event monitor or ILR. ? ?Will sign off.  Call with any questions.  ? ? ?

## 2021-04-29 NOTE — ED Notes (Signed)
Patient ambulated to and from bathroom without difficulty.

## 2021-04-29 NOTE — ED Notes (Signed)
Pt transported to [facility] for cta ?

## 2021-04-29 NOTE — Progress Notes (Signed)
Pt given AVS and IV removed by day shift nurse. Belongings packed and with pt. Pt a&Ox4, no complaints. Pt taken down by NT in wheelchair for discharge.  ?

## 2021-04-29 NOTE — Evaluation (Signed)
Occupational Therapy Evaluation ?Patient Details ?Name: Nancy [name] ?MRN: [id number] ?DOB: [birth year] ?Today's Date: 04/29/2021 ? ? ?History of Present Illness Patient is a 81 year old female presented after syncope episode on commode.  patient was found to have UTI. PMH: DM II, HTN, HLD, gout, osteoarthritis, chronic back pain,  ? ?Clinical Impression ?  ?Patient evaluated by Occupational Therapy with no further acute OT needs identified. All education has been completed and the patient has no further questions. Patient is at baseline at this time. Patient as noted to have unsteadiness and drifting during functional mobility. Patient reported seeing outpatient PT recommended for patient to continue her outpatient program.  See below for any follow-up Occupational Therapy or equipment needs. OT is signing off. Thank you for this referral. ?  ?   ? ?Recommendations for follow up therapy are one component of a multi-disciplinary discharge planning process, led by the attending physician.  Recommendations may be updated based on patient status, additional functional criteria and insurance authorization.  ? ?Follow Up Recommendations ? No OT follow up  ?  ?Assistance Recommended at Discharge Frequent or constant Supervision/Assistance  ?Patient can return home with the following A little help with walking and/or transfers;A little help with bathing/dressing/bathroom;Assistance with cooking/housework;Direct supervision/assist for medications management;Direct supervision/assist for financial management;Assist for transportation ? ?  ?Functional Status Assessment ? Patient has not had a recent decline in their functional status  ?Equipment Recommendations ? None recommended by OT  ?  ?Recommendations for Other Services   ? ? ?  ?Precautions / Restrictions Precautions ?Precautions: Fall ?Restrictions ?Weight Bearing Restrictions: No  ? ?  ? ?Mobility Bed Mobility ?Overal bed mobility: Modified Independent ?  ?  ?  ?  ?   ?  ?  ?  ? ?Transfers ?  ?  ?  ?  ?  ?  ?  ?  ?  ?  ?  ? ?  ?Balance Overall balance assessment: Needs assistance ?Sitting-balance support: Feet supported, No upper extremity supported ?Sitting balance-Leahy Scale: Good ?  ?  ?  ?Standing balance-Leahy Scale: Fair ?  ?  ?  ?  ?  ?  ?  ?  ?  ?  ?  ?  ?   ? ?ADL either performed or assessed with clinical judgement  ? ?ADL Overall ADL's : At baseline ?  ?  ?  ?  ?  ?  ?  ?  ?  ?  ?  ?  ?  ?  ?  ?  ?  ?  ?  ?General ADL Comments: patient is at her baseline for ADLs at this time. patient was MI with LB dressing seated. patient needed SUP with functional mobility with no AD with patient noted to drift and cross feet without warning during functional mobility longer distances. patient reported she does this at home and sometimes uses a RW. patient reported having outpatient PT for balance.  ? ? ? ?Vision Patient Visual Report: No change from baseline ?   ?   ?Perception   ?  ?Praxis   ?  ? ?Pertinent Vitals/Pain Pain Assessment ?Pain Assessment: No/denies pain  ? ? ? ?Hand Dominance Right ?  ?Extremity/Trunk Assessment Upper Extremity Assessment ?Upper Extremity Assessment: LUE deficits/detail;RUE deficits/detail ?RUE Deficits / Details: patient reported wearing arthritis gloves at home for arthritis in thenar emenance bilaterally. ROM WFL, strength WFL ?LUE Deficits / Details: gout in L hand recenlty. ROM WFL strength WFL ?  ?Lower Extremity  Assessment ?Lower Extremity Assessment: Overall WFL for tasks assessed (patient reported feeling like loosing feeling in balls of feet on bottom of feet.) ?  ?Cervical / Trunk Assessment ?Cervical / Trunk Assessment: Normal ?  ?Communication Communication ?Communication: No difficulties ?  ?Cognition Arousal/Alertness: Awake/alert ?Behavior During Therapy: [facility] for tasks assessed/performed ?Overall Cognitive Status: Within Functional Limits for tasks assessed ?  ?  ?  ?  ?  ?  ?  ?  ?  ?  ?  ?  ?  ?  ?  ?  ?General Comments: patient  was alert and oriented x4 plesant and cooperative ?  ?  ?General Comments    ? ?  ?Exercises   ?  ?Shoulder Instructions    ? ? ?Home Living Family/patient expects to be discharged to:: Private residence ?Living Arrangements: Children (daughter and family) ?Available Help at Discharge: Family;Available 24 hours/day ?Type of Home: House ?Home Access: Stairs to enter ?Entrance Stairs-Number of Steps: 1 ?  ?Home Layout: Two level;Able to live on main level with bedroom/bathroom ?Alternate Level Stairs-Number of Steps: does not go upstairs ?  ?Bathroom Shower/Tub: Walk-in shower ?  ?  ?  ?  ?Home Equipment: Conservation officer, nature (2 wheels);Cane - single point;BSC/3in1;Shower seat - built in ?  ?Additional Comments: adjustable bed ?  ? ?  ?Prior Functioning/Environment Prior Level of Function : Independent/Modified Independent ?  ?  ?  ?  ?  ?  ?Mobility Comments: was in outpatient PT for balance ?  ?  ? ?  ?  ?OT Problem List:   ?  ?   ?OT Treatment/Interventions:    ?  ?OT Goals(Current goals can be found in the care plan section) Acute Rehab OT Goals ?OT Goal Formulation: All assessment and education complete, DC therapy  ?OT Frequency:   ?  ? ?Co-evaluation   ?  ?  ?  ?  ? ?  ?AM-PAC OT "6 Clicks" Daily Activity     ?Outcome Measure Help from another person eating meals?: None ?Help from another person taking care of personal grooming?: None ?Help from another person toileting, which includes using toliet, bedpan, or urinal?: A Little ?Help from another person bathing (including washing, rinsing, drying)?: A Little ?Help from another person to put on and taking off regular upper body clothing?: None ?Help from another person to put on and taking off regular lower body clothing?: A Little ?6 Click Score: 21 ?  ?End of Session Nurse Communication: Other (comment) (ok to participate) ? ?Activity Tolerance: Patient tolerated treatment well ?Patient left: in bed;with call bell/phone within reach ? ?OT Visit Diagnosis:  Unsteadiness on feet (R26.81)  ?              ?Time: 7591-6384 ?OT Time Calculation (min): 14 min ?Charges:  OT General Charges ?$OT Visit: 1 Visit ?OT Evaluation ?$OT Eval Low Complexity: 1 Low ? ?Astou Lada OTR/L, MS ?Acute Rehabilitation Department ?Office# [phone] ?Pager# [phone] ? ? ?[place] ?04/29/2021, 12:19 PM ?

## 2021-04-29 NOTE — Progress Notes (Signed)
?  Echocardiogram ?2D Echocardiogram has been performed. ? ?Bobbye Charleston ?04/29/2021, 8:36 AM ?

## 2021-04-29 NOTE — Consult Note (Addendum)
?Cardiology Consultation:  ? ?Patient ID: Nancy [name] ?MRN: [id number]; DOB: 1940/11/01 ? ?Admit date: 04/28/2021 ?Date of Consult: 04/29/2021 ? ?PCP:  Buzzy Han, MD ?  ?[facility] ?Cardiologist:  Fransico Him, MD   { ? ? ? ?Patient Profile:  ? ?Nancy [name] is a 81 y.o. female with a hx of HTN, diastolic dysfunction, PVCs, OSA on CPAP, minimal CAD (by coronary CT), CKD IIIa, GERD, HLD, type 2 II DM, obesity,  who is being seen 04/29/2021 for the evaluation of syncope at the request of Dr. Roel Cluck.  ? ?History of Present Illness:  ? ?Nancy [name] with above PMH follows cardiology Dr Radford Pax outpatient for HTN and diastolic dysfunction. She also has a history of mild pulmonary hypertension by Echo in July 2013.  She had a Lexiscan Myoview for chest pain 10/13/2016 which showed no inducible ischemia. Echo repeated in 08/05/2017 LVEF with EF 60-65% with moderate LVH and elevated filling pressures.  ? ?Due to complaints of atypical chest pain, palpitation, and DOE, she had serials of workup done in 2019. Event monitor from 08/05/2017 showed sinus rhythm with heart rate average 64 bpm, ranges from 50 to 164 bpm, occasional PVC and PACs with burden <1%. CT coronary study from 11/07/2017 showed coronary artery calcium score 6.5 Agatston units, 30th percentile for age and gender, low risk, no obstructive CAD.  ? ?She had complained worsening SOB with chest pain in 08/2019, where she underwent repeat workup with CT coronary study on 09/13/19 which showed coronary calcium score of 26, 48th percentile for age and sex matched control. Normal coronary origin with right dominance. Minimal atherosclerosis.  Her chest pain was felt due to non-cardiac origin. A repeat event monitor in 08/2019 was done showed sinus rhythm average HR 71 bpm, ranging 58-103 bpm, 1 short run of nonsutained atrial tachycardia up to 5 beats, 2 episodes of sinus pauses with 3.5 second pauses, one occurred during sleep and one  occurred when she was watching TV and had no symptoms.  ? ?She was evaluated in the clinic for dizziness 11/2019, also had some palpitations and chronic DOE. Echo from 12/05/19 showed LVEF 60-65%, no RWMA, severe asymmetric LVH of the basal-septal segment, indeterminate diastolic parameters, normal RV, normal PASP with RVSP 27.4 mmHg, no valvular disease. Subsequent cardiac MRI was done 01/03/20 showed Normal left ventricular size with moderate basal septal hypertrophy and hyperdynamic systolic function (LVEF = 73%), no RWMA, no late gadolinium enhancement in the left ventricular myocardium. Normal RV. No significant valvular abnormalities. It was felt there is no evidence of cardiac amyloid or HOCM and findings c/w hypertensive heart disease. She was referred to pulmonology due to chronic DOE and suspected lung diease. She saw pulmonology in 05/12/20 where her complaints was felt [name] fatigue than dyspnea, etiology was felt due to deconditioning and obesity. She was arranged for PFT on 05/28/20, where it showed minimal airway obstruction. In addition, lung volumes are reduced indicating a concurrent restrictive process. ? ?She was last seen by Dr Radford Pax in the office on 04/14/21, had complained chest pressure with pins and needle sensation of chest radiating to back, feeling hot, and has SOB. BP was well controlled on amlodipine 5 mg daily, losartan 50 mg daily, Lopressor 25 mg twice daily. Chest pain was felt atypical and CT coronary study was requested at the time, meanwhile she is continued risk modification for ASCAD with ASA, statin, and BB.  ? ?Patient presented to ER on 04/28/21 night c/o feeling lightheaded while using  toilet. EMS was called after she had syncope episode lasting 1 minute, reportedly they did do CPR briefly and found her BP was low 86/50, that improved with IV fluids.  Patient states she was having a bowel movement yesterday, while sitting on the toilet, she felt suddenly very warm and well, had  some lightheadedness, felt she was about to pass out.  Her daughter was with her and witnessed that she had syncope for about 1 minute.  She did not experiencing any prodromal chest pain.  She woke up feeling tired and weak.  She continued to have intermittent chest discomfort that feels like pins-and-needles, seemed [name] intense than past.  She continues to have chronic dyspnea on exertion that is unchanged from baseline.  She denies any orthopnea, PND, weight gain, edema.  She takes a lots of medication.  She states she has been urinating very frequently since she arrived to ER.  ? ?Admission diagnostic for revealed chronic stable CKD III, Hs troponin negative x1.  Lactic acid elevated 3.1 >2.2.  CBC revealed leukocytosis with WBC 17.3>12 K, hemoglobin 11.7. UA with pyruia, urine culture pending.  Flu and COVID-negative.  EKG from 04/28/2021 at 1845 revealed sinus rhythm with ventricular rate of 76 bpm, nonspecific change, quality is poor. CXR no acute finding. Echocardiogram is requested and pending.  She has remained hemodynamically stable since ER, blood pressure labile ranging 107/56- 187/92.  Patient is admitted to hospital medicine service.  Cardiology is consulted for further evaluation of syncope as well as chest pain.   ? ? ? ? ?Past Medical History:  ?Diagnosis Date  ? Allergy   ? Anemia   ? Aortic atherosclerosis ([place])   ? Breast cyst   ? left breast  ? CAD (coronary artery disease), native coronary artery   ? minimal ASCAD to the distal LM and mid LAD with 0-24% stenosis and Calcium score of 26.  ? Chest pain   ? CKD (chronic kidney disease), stage III ([place])   ? Complication of anesthesia 1986  ? slow to awaken after cholecystectomy  ? Diastolic dysfunction   ? with mild pulmonary HTN by echo 07/2011  ? Gastric ulcer   ? GERD (gastroesophageal reflux disease)   ? Gout   ? Hyperlipidemia   ? Hypertension   ? Neuropathy   ? from diabetes per patient  ? Osteoarthritis   ? Osteopenia   ? Shortness of breath  dyspnea   ? with activity  ? Sinus pause   ? Spine deformity   ? lower spine  ? Type II diabetes mellitus ([place])   ? ? ?Past Surgical History:  ?Procedure Laterality Date  ? ABDOMINAL ADHESION SURGERY    ? ABDOMINAL HYSTERECTOMY    ? ANUS SURGERY    ? for torn tissue  ? APPENDECTOMY    ? BREAST BIOPSY    ? BREAST LUMPECTOMY    ? left  ? CHOLECYSTECTOMY  1986  ? COLONOSCOPY    ? ESOPHAGOGASTRODUODENOSCOPY (EGD) WITH PROPOFOL N/A 07/18/2013  ? Procedure: ESOPHAGOGASTRODUODENOSCOPY (EGD) WITH PROPOFOL;  Surgeon: Arta Silence, MD;  Location: [facility];  Service: Endoscopy;  Laterality: N/A;  ? EUS N/A 07/18/2013  ? Procedure: ESOPHAGEAL ENDOSCOPIC ULTRASOUND (EUS) RADIAL;  Surgeon: Arta Silence, MD;  Location: [facility];  Service: Endoscopy;  Laterality: N/A;  ? IR GENERIC HISTORICAL  10/30/2015  ? IR US GUIDE VASC ACCESS RIGHT 10/30/2015 Saverio Danker, PA-C [facility]  ? IR GENERIC HISTORICAL  10/30/2015  ? IR FLUORO GUIDE CV  LINE RIGHT 10/30/2015 Saverio Danker, PA-C [facility]  ? LUMBAR WOUND DEBRIDEMENT N/A 10/01/2015  ? Procedure: LUMBAR WOUND DEBRIDEMENT;  Surgeon: Melina Schools, MD;  Location: [facility];  Service: Orthopedics;  Laterality: N/A;  ? LUMBAR WOUND DEBRIDEMENT N/A 10/15/2015  ? Procedure: [facility] OUT AND CLOSURE OF BACK WOUND;  Surgeon: Melina Schools, MD;  Location: [place];  Service: Orthopedics;  Laterality: N/A;  ? MASS EXCISION Left 07/24/2015  ? Procedure: EXCISION MASS LEFT LONG FINGER;  Surgeon: Leanora Cover, MD;  Location: [facility];  Service: Orthopedics;  Laterality: Left;  ? SPINAL FUSION N/A 09/17/2015  ? Procedure: FUSION POSTERIOR SPINAL MULTILEVEL L4- S1;  Surgeon: Melina Schools, MD;  Location: [facility];  Service: Orthopedics;  Laterality: N/A;  ? TRANSFORAMINAL LUMBAR INTERBODY FUSION (TLIF) WITH PEDICLE SCREW FIXATION 1 LEVEL N/A 09/17/2015  ? Procedure: TRANSFORAMINAL LUMBAR INTERBODY FUSION (TLIF) WITH PEDICLE SCREW FIXATION 1 LEVEL , Lumbar 4-5;  Surgeon: Melina Schools, MD;   Location: [facility];  Service: Orthopedics;  Laterality: N/A;  ?  ? ?Home Medications:  ?Prior to Admission medications   ?Medication Sig Start Date End Date Taking? Authorizing Provider  ?allopurinol (ZYL

## 2021-04-29 NOTE — ED Notes (Signed)
Respiratory assessment completed per MD order - bilateral breath sounds clear to auscultation - patient breathing respiratory rate and effort WNL - [id number] ?

## 2021-04-29 NOTE — Final Progress Note (Signed)
PT Cancellation Note ? ?Patient Details ?Name: Nancy [name] ?MRN: [id number] ?DOB: 1940-09-15 ? ? ?Cancelled Treatment:      ?Pt was assessed by OT in the ED for mobility and ADLS. OT discussed findings with PT and stated pt is at baseline from home. Pt is aware she has some balance issues and uses RW at home sometimes. She is currently receiving OPPT, and should continue with this service after DC from ED.  ? ?Pt has been walking to and from the bathroom with staff in ED with not difficulties and has assistance at home as well.  ? ?No Acute PT needs in the ED at this time will sign off and recommend pt continue with her OPPT program.  ? ? Clide Dales ?04/29/2021, 1:05 PM ?Gatha Mayer, PT, MPT ?Acute Rehabilitation Services ?Office: [phone] ?Pager: [phone] ?04/29/2021 ? ?

## 2021-05-01 ENCOUNTER — Telehealth: Payer: Self-pay

## 2021-05-01 LAB — URINE CULTURE: Culture: >=100000 — AB

## 2021-05-01 NOTE — Telephone Encounter (Signed)
CT results sent to PCP ([id number]) and pt aware to continue current medications. ?

## 2021-05-01 NOTE — Telephone Encounter (Signed)
-----   Message from Sueanne Margarita, MD sent at 04/30/2021  5:02 PM EDT ----- ?Patient already aware of coronary CT showing mild coronary disease.  Noncardiac part showed possible bronchomalacia as well as small hiatal hernia and aortic atherosclerosis.  Please forward this report to her PCP for further evaluation of noncardiac findings.  Continue aspirin and statin.  LDL at goal in January follow-up with me in 1 year or sooner if any problems ?

## 2021-05-02 NOTE — Discharge Summary (Signed)
?Physician Discharge Summary ?  ?Patient: Nancy [name] MRN: [id number] DOB: [birth year]  ?Admit date:     04/28/2021  ?Discharge date: 04/29/2021  ?Discharge Physician: Berle Mull  ?PCP: Buzzy Han, MD ? ?Recommendations at discharge: ?Follow-up with PCP in 1 week.  Follow-up with cardiology as recommended. ? ?Discharge Diagnoses: ?Principal Problem: ?  Syncope ?Active Problems: ?  AKI (acute kidney injury) ([place]) ?  Essential hypertension, benign ?  Chronic diastolic CHF (congestive heart failure) ([place]) ?  Type 2 diabetes mellitus with diabetic polyneuropathy, without long-term current use of insulin ([place]) ?  Pulmonary hyperinflation ?  Hyperlipidemia ?  Chest pain ?  CAD (coronary artery disease), native coronary artery ?  OSA on CPAP ?  UTI (urinary tract infection) ? ? ?Hospital Course: ?Nancy [name] is a 81 y.o. female with medical history significant of CAD, HTN, HLD, OSA on CPAP, Pulmonary HTN, Chest pain, CKD 3a, GERD, DM2 Presented with syncope. ? ?Assessment and Plan: ?Syncope ?Likely vasovagal in nature. ?Echocardiogram unremarkable.  No focal deficit on examination. ?Telemetry unremarkable as well. ?EKG unremarkable. ?Troponins are negative.  No orthostatic. ?Patient ambulating in the room without any dizziness. ?PT recommends no further therapy. ? ?Essential hypertension, benign ?Resuming home regimen. ?  ?Chronic diastolic CHF (congestive heart failure) ([place]) ?No evidence of volume overload. ?Monitor. ?  ?Pulmonary hypertension ?Latest echo showed no evidence of pulmonary HTN ?  ?Hyperlipidemia ?continue lipitor ?  ?Type 2 diabetes mellitus with diabetic polyneuropathy, without long-term current use of insulin ([place]) ?Continue home regimen. ?  ?CAD (coronary artery disease), native coronary artery ?Chest pain. ?Repeat CT coronary chest shows no evidence of significant obstruction. ?No further recommended by cardiology. ?  ?UTI (urinary tract infection) ?Urine culture growing  Klebsiella. ?Treated with oral antibiotic. ?Monitor. ?  ?AKI (acute kidney injury) ([place]) ?Baseline serum creatinine 0.9. ?On admission serum creatinine 1.46. ?Improving with IV hydration. ?  ?OSA on CPAP ?Continue CPAP ? ?Obesity ?Body mass index is 32.37 kg/m?[name]  ?Placing the pt at higher risk of poor outcomes. ? ?Consultants: Cardiology  ?Procedures performed:  ?Echocardiogram  ?DISCHARGE MEDICATION: ?Allergies as of 04/29/2021   ? ?   Reactions  ? Ketorolac Tromethamine Swelling  ? 10/02/15 - given Toradol + morphine and RN noted tongue swelling - given Epi + Benadryl ?Has taken ibuprofen in past w/o problem ?Other reaction(s): Unknown  ? Morphine Swelling  ? Face and tongue swelling  ? Morphine Sulfate   ? Other reaction(s): Unknown  ? Codeine Other (See Comments)  ? headache ?Other reaction(s): Unknown  ? ?  ? ?  ?Medication List  ?  ? ?STOP taking these medications   ? ?losartan 50 MG tablet ?Commonly known as: COZAAR ?  ? ?  ? ?TAKE these medications   ? ?allopurinol 300 MG tablet ?Commonly known as: ZYLOPRIM ?Take 300 mg by mouth daily. ?  ?amLODipine 5 MG tablet ?Commonly known as: NORVASC ?Take 5 mg by mouth daily. ?  ?aspirin EC 81 MG tablet ?Take 81 mg by mouth daily. ?  ?atorvastatin 10 MG tablet ?Commonly known as: LIPITOR ?Take 1 tablet (10 mg total) by mouth daily. Please make overdue appt with Dr.Turner before anymore refills. Thank you 1st attempt ?  ?cephALEXin 500 MG capsule ?Commonly known as: KEFLEX ?Take 1 capsule (500 mg total) by mouth 2 (two) times daily for 3 days. ?  ?colchicine 0.6 MG tablet ?Take 0.6 mg by mouth daily. ?  ?dapagliflozin propanediol 10 MG Tabs tablet ?Commonly known as:  [place] ?Take 1 tablet (10 mg total) by mouth daily before breakfast. ?  ?Durezol 0.05 % Emul ?Generic drug: Difluprednate ?Apply 1-2 drops to eye as directed. For dry eyes ?  ?famotidine 20 MG tablet ?Commonly known as: PEPCID ?Take 20 mg by mouth 2 (two) times daily. ?  ?furosemide 20 MG tablet ?Commonly  known as: LASIX ?Take 1 tablet (20 mg total) by mouth daily as needed (for weight gain of >3lbs in 1 day or >5lbs in 1 week). ?What changed:  ?when to take this ?reasons to take this ?  ?gabapentin 300 MG capsule ?Commonly known as: NEURONTIN ?Take 600 mg by mouth 3 (three) times daily. ?  ?glucose blood test strip ?Use as instructed to test blood sugars 2 times daily E11.65 ?  ?metFORMIN 1000 MG tablet ?Commonly known as: GLUCOPHAGE ?Take 1 tablet (1,000 mg total) by mouth daily with breakfast. ?  ?metoprolol tartrate 50 MG tablet ?Commonly known as: LOPRESSOR ?Take 0.5 tablets (25 mg total) by mouth 2 (two) times daily. ?What changed: how much to take ?  ?Multiple Vitamins tablet ?Take 1 tablet by mouth daily. ?  ?omeprazole 20 MG capsule ?Commonly known as: PRILOSEC ?Take 20 mg by mouth daily as needed (acid reflux). ?  ?OneTouch Verio IQ System w/Device Kit ?1 kit by Does not apply route 2 (two) times daily. E11.65 ?  ?sitaGLIPtin 50 MG tablet ?Commonly known as: Januvia ?Take 1 tablet (50 mg total) by mouth daily. ?  ?tiZANidine 4 MG tablet ?Commonly known as: ZANAFLEX ?Take 4 mg by mouth 2 (two) times daily as needed for muscle spasms. ?  ?traMADol 50 MG tablet ?Commonly known as: ULTRAM ?Take 50 mg by mouth every 6 (six) hours as needed for moderate pain or severe pain. ?  ? ?  ? ? Follow-up Information   ? ? Buzzy Han, MD. Schedule an appointment as soon as possible for a visit in 1 week(s).   ?Specialty: Family Medicine ?Contact information: ?[address] ?[place] [address] ?[phone] ? ? ?  ?  ? ? Sueanne Margarita, MD .   ?Specialty: Cardiology ?Contact information: ?[address] ?[address] ?[place] [address] ?[phone] ? ? ?  ?  ? ?  ?  ? ?  ? ?Disposition: Home ?Diet recommendation: Cardiac diet ? ?Discharge Exam: ?Vitals:  ? 04/29/21 1330 04/29/21 1345 04/29/21 1400 04/29/21 1639  ?BP:   138/72 136/70  ?Pulse: 88 74 74 70  ?Resp: 16 16 12    ?Temp:   98 ?F (36.7 ?C)  97.8 ?F (36.6 ?C)  ?TempSrc:    Oral  ?SpO2: 100% 100% 99% 97%  ?Weight:      ?Height:      ? ?General: Appear in no distress; no visible Abnormal Neck Mass Or lumps, Conjunctiva normal ?Cardiovascular: S1 and S2 Present, no Murmur, ?Respiratory: good respiratory effort, Bilateral Air entry present and CTA, no Crackles, no wheezes ?Abdomen: Bowel Sound present, Non tender ?Extremities: no Pedal edema ?Neurology: alert and oriented to time, place, and person ?Gait not checked due to patient safety concerns ?Filed Weights  ? 04/28/21 1304  ?Weight: 80.3 kg  ? ?Condition at discharge: stable ? ?The results of significant diagnostics from this hospitalization (including imaging, microbiology, ancillary and laboratory) are listed below for reference.  ? ?Imaging Studies: ?CT ABDOMEN PELVIS W CONTRAST ? ?Result Date: 04/28/2021 ?CLINICAL DATA:  Abdominal pain, acute, nonlocalized EXAM: CT ABDOMEN AND PELVIS WITH CONTRAST TECHNIQUE: Multidetector CT imaging of the abdomen and  pelvis was performed using the standard protocol following bolus administration of intravenous contrast. RADIATION DOSE REDUCTION: This exam was performed according to the departmental dose-optimization program which includes automated exposure control, adjustment of the mA and/or kV according to patient size and/or use of iterative reconstruction technique. CONTRAST:  75m OMNIPAQUE IOHEXOL 300 MG/ML  SOLN COMPARISON:  None FINDINGS: Lower chest: No acute abnormality Hepatobiliary: No focal liver abnormality is seen. Status post cholecystectomy. No biliary dilatation. Pancreas: No focal abnormality or ductal dilatation. Spleen: No focal abnormality.  Normal size. Adrenals/Urinary Tract: Areas of cortical thinning and scarring throughout the kidneys bilaterally. No mass or hydronephrosis. No renal or ureteral stones. Adrenal glands and urinary bladder unremarkable. Stomach/Bowel: Stomach, large and small bowel grossly unremarkable. Vascular/Lymphatic:  Aortic atherosclerosis. No evidence of aneurysm or adenopathy. Reproductive: Prior hysterectomy.  No adnexal masses. Other: No free fluid or free air. Musculoskeletal: Postoperative changes in the lower lumb

## 2021-05-04 ENCOUNTER — Telehealth: Payer: Self-pay | Admitting: Cardiology

## 2021-05-04 NOTE — Telephone Encounter (Signed)
Pt c/o medication issue: ? ?1. Name of Medication: losartan (COZAAR) tablet 50 mg ? ?2. How are you currently taking this medication (dosage and times per day)? Patient is not taking anymore  ? ?3. Are you having a reaction (difficulty breathing--STAT)? no ? ?4. What is your medication issue? Patient was told to stop taking this medication when she was in the hospital. The patient would just like to know what Dr. Radford Pax thinks ?

## 2021-05-04 NOTE — Telephone Encounter (Signed)
Left message for patient with advisement from Dr. Radford Pax. Advised to call back with any questions.  ?

## 2021-05-04 NOTE — Telephone Encounter (Signed)
Patient's losartan was discontinued upon discharge from the hospital. She would like to know if she should remain off of it. The batteries in her BP cuff have died so she has not been taking her BP at home. I have advised the patient to replace batteries and start keeping a log of BP's and let us know if it has been running too high or low. She is also going to see her PCP this week and they will be checking it at that time as well. She states that overall since being discharged she has been feeling fine.  ?

## 2021-05-08 ENCOUNTER — Ambulatory Visit: Payer: Medicare Other | Admitting: Surgical

## 2021-05-22 ENCOUNTER — Ambulatory Visit: Payer: Medicare Other | Admitting: Surgical

## 2021-05-27 ENCOUNTER — Ambulatory Visit (INDEPENDENT_AMBULATORY_CARE_PROVIDER_SITE_OTHER): Payer: Medicare Other | Admitting: Surgical

## 2021-05-27 ENCOUNTER — Encounter: Payer: Self-pay | Admitting: Orthopedic Surgery

## 2021-05-27 DIAGNOSIS — M170 Bilateral primary osteoarthritis of knee: Secondary | ICD-10-CM | POA: Diagnosis not present

## 2021-05-27 DIAGNOSIS — M1712 Unilateral primary osteoarthritis, left knee: Secondary | ICD-10-CM

## 2021-05-27 DIAGNOSIS — M1711 Unilateral primary osteoarthritis, right knee: Secondary | ICD-10-CM

## 2021-05-27 MED ORDER — LIDOCAINE HCL 1 % IJ SOLN
5.0000 mL | INTRAMUSCULAR | Status: AC | PRN
  Administered 2021-05-27: 5 mL

## 2021-05-27 MED ORDER — BUPIVACAINE HCL 0.25 % IJ SOLN
4.0000 mL | INTRAMUSCULAR | Status: AC | PRN
  Administered 2021-05-27: 4 mL via INTRA_ARTICULAR

## 2021-05-27 MED ORDER — METHYLPREDNISOLONE ACETATE 40 MG/ML IJ SUSP
40.0000 mg | INTRAMUSCULAR | Status: AC | PRN
  Administered 2021-05-27: 40 mg via INTRA_ARTICULAR

## 2021-05-27 NOTE — Progress Notes (Signed)
Office Visit Note   Patient: Nancy [name]           Date of Birth: [birth year]           MRN: [id number] Visit Date: 05/27/2021 Requested by: Buzzy Han, MD No address on file PCP: Buzzy Han, MD (Inactive)  Subjective: Chief Complaint  Patient presents with   Right Knee - Pain   Left Knee - Pain    HPI: Nancy [name] is a 81 y.o. female who presents to the office complaining of bilateral knee pain.  Patient has history of bilateral knee osteoarthritis.  Last injections in 07/22/2020.  Overall these injections are controlling her knee pain and she only has to receive them every 8 to 12 months.  She would like to have these again today.  No new injuries.  Localizes pain diffusely around the knee joint.  Left knee bothers her more than the right.  No fevers or chills.  No mechanical or instability symptoms.  Denies any groin pain or radicular pain..                ROS: All systems reviewed are negative as they relate to the chief complaint within the history of present illness.  Patient denies fevers or chills.  Assessment & Plan: Visit Diagnoses:  1. Primary osteoarthritis of both knees     Plan: Patient is a 81 year old female who presents for bilateral knee injections.  She does have history of diabetes.  Recommended she check her blood glucose twice daily for the next week and call her endocrinologist or PCP if the blood sugar rises too high.  She understands agrees with plan.  She sees her endocrinologist tomorrow.  Both knees were injected today with cortisone and she tolerated the procedure well.  She will follow-up with the office as needed.  Follow-Up Instructions: No follow-ups on file.   Orders:  No orders of the defined types were placed in this encounter.  No orders of the defined types were placed in this encounter.     Procedures: Large Joint Inj: bilateral knee on 05/27/2021 1:15 PM Indications: diagnostic evaluation, joint  swelling and pain Details: 18 G 1.5 in needle, superolateral approach  Arthrogram: No  Medications (Right): 5 mL lidocaine 1 %; 4 mL bupivacaine 0.25 %; 40 mg methylPREDNISolone acetate 40 MG/ML Medications (Left): 5 mL lidocaine 1 %; 4 mL bupivacaine 0.25 %; 40 mg methylPREDNISolone acetate 40 MG/ML Outcome: tolerated well, no immediate complications Procedure, treatment alternatives, risks and benefits explained, specific risks discussed. Consent was given by the patient. Immediately prior to procedure a time out was called to verify the correct patient, procedure, equipment, support staff and site/side marked as required. Patient was prepped and draped in the usual sterile fashion.      Clinical Data: No additional findings.  Objective: Vital Signs: There were no vitals taken for this visit.  Physical Exam:  Constitutional: Patient appears well-developed HEENT:  Head: Normocephalic Eyes:EOM are normal Neck: Normal range of motion Cardiovascular: Normal rate Pulmonary/chest: Effort normal Neurologic: Patient is alert Skin: Skin is warm Psychiatric: Patient has normal mood and affect  Ortho Exam: Ortho exam demonstrates right knee with 0 degrees extension and 115 degrees of knee flexion.  Left knee with 5 degrees extension and 105 degrees of knee flexion.  Able to perform straight leg raise with both legs.  Tenderness over the medial and lateral joint lines bilaterally.  No calf tenderness bilaterally.  No pain with hip range of  motion bilaterally.  Cheilitis or skin changes noted.  Specialty Comments:  No specialty comments available.  Imaging: No results found.   PMFS History: Patient Active Problem List   Diagnosis Date Noted   Syncope 04/28/2021   UTI (urinary tract infection) 04/28/2021   AKI (acute kidney injury) ([place]) 04/28/2021   OSA on CPAP 04/28/2021   Aortic atherosclerosis ([place])    Type 2 diabetes mellitus with stage 3b chronic kidney disease, without  long-term current use of insulin ([place]) 03/03/2020   Polyneuropathy associated with underlying disease ([place]) 03/03/2020   CAD (coronary artery disease), native coronary artery    Pulmonary hypertension, unspecified ([place]) 07/29/2017   Chest pain 10/07/2016   SOB (shortness of breath) 10/07/2016   Pseudogout of bilateral knees 03/16/2016   Osteoarthritis 03/16/2016   SIRS (systemic inflammatory response syndrome) ([place]) 03/14/2016   Elevated sed rate 03/14/2016   Primary osteoarthritis of both knees 01/01/2016   Postoperative wound infection 12/18/2015   Difficulty in walking, not elsewhere classified    Status post lumbar spinal fusion    Type 2 diabetes mellitus with diabetic polyneuropathy, without long-term current use of insulin ([place])    Infection due to ESBL-producing Escherichia coli    Angioedema    Hyperlipidemia 10/01/2015   Gait instability 02/19/2013   Pulmonary hyperinflation 01/17/2013   Chronic diastolic CHF (congestive heart failure) ([place])    Essential hypertension, benign 01/03/2013   Past Medical History:  Diagnosis Date   Allergy    Anemia    Aortic atherosclerosis ([place])    Breast cyst    left breast   CAD (coronary artery disease), native coronary artery    minimal ASCAD to the distal LM and mid LAD with 0-24% stenosis and Calcium score of 26.   Chest pain    CKD (chronic kidney disease), stage III (HCC)    Complication of anesthesia 1986   slow to awaken after cholecystectomy   Diastolic dysfunction    with mild pulmonary HTN by echo 07/2011   Gastric ulcer    GERD (gastroesophageal reflux disease)    Gout    Hyperlipidemia    Hypertension    Neuropathy    from diabetes per patient   Osteoarthritis    Osteopenia    Shortness of breath dyspnea    with activity   Sinus pause    Spine deformity    lower spine   Type II diabetes mellitus ([place])     Family History  Problem Relation Age of Onset   Heart disease Mother    Cancer Sister        ovarian     Past Surgical History:  Procedure Laterality Date   ABDOMINAL ADHESION SURGERY     ABDOMINAL HYSTERECTOMY     ANUS SURGERY     for torn tissue   APPENDECTOMY     BREAST BIOPSY     BREAST LUMPECTOMY     left   CHOLECYSTECTOMY  1986   COLONOSCOPY     ESOPHAGOGASTRODUODENOSCOPY (EGD) WITH PROPOFOL N/A 07/18/2013   Procedure: ESOPHAGOGASTRODUODENOSCOPY (EGD) WITH PROPOFOL;  Surgeon: Arta Silence, MD;  [facility];  Service: Endoscopy;  Laterality: N/A;   EUS N/A 07/18/2013   Procedure: ESOPHAGEAL ENDOSCOPIC ULTRASOUND (EUS) RADIAL;  Surgeon: Arta Silence, MD;  [facility];  Service: Endoscopy;  Laterality: N/A;   IR GENERIC HISTORICAL  10/30/2015   IR US GUIDE VASC ACCESS RIGHT 10/30/2015 Saverio Danker, PA-C [facility]   IR GENERIC HISTORICAL  10/30/2015  IR FLUORO GUIDE CV LINE RIGHT 10/30/2015 Saverio Danker, PA-C [facility]   LUMBAR WOUND DEBRIDEMENT N/A 10/01/2015   Procedure: LUMBAR WOUND DEBRIDEMENT;  Surgeon: Melina Schools, MD;  Location: [place];  Service: Orthopedics;  Laterality: N/A;   LUMBAR WOUND DEBRIDEMENT N/A 10/15/2015   Procedure: [facility] OUT AND CLOSURE OF BACK WOUND;  Surgeon: Melina Schools, MD;  Location: [place];  Service: Orthopedics;  Laterality: N/A;   MASS EXCISION Left 07/24/2015   Procedure: EXCISION MASS LEFT LONG FINGER;  Surgeon: Leanora Cover, MD;  Location: [place];  Service: Orthopedics;  Laterality: Left;   SPINAL FUSION N/A 09/17/2015   Procedure: FUSION POSTERIOR SPINAL MULTILEVEL L4- S1;  Surgeon: Melina Schools, MD;  Location: [place];  Service: Orthopedics;  Laterality: N/A;   TRANSFORAMINAL LUMBAR INTERBODY FUSION (TLIF) WITH PEDICLE SCREW FIXATION 1 LEVEL N/A 09/17/2015   Procedure: TRANSFORAMINAL LUMBAR INTERBODY FUSION (TLIF) WITH PEDICLE SCREW FIXATION 1 LEVEL , Lumbar 4-5;  Surgeon: Melina Schools, MD;  Location: [place];  Service: Orthopedics;  Laterality: N/A;   Social History   Occupational History   Not on  file  Tobacco Use   Smoking status: Never   Smokeless tobacco: Never  Vaping Use   Vaping Use: Never used  Substance and Sexual Activity   Alcohol use: No    Comment: occasional wine   Drug use: No   Sexual activity: Not Currently

## 2021-05-28 ENCOUNTER — Other Ambulatory Visit: Payer: Self-pay | Admitting: Cardiology

## 2021-05-28 ENCOUNTER — Ambulatory Visit (INDEPENDENT_AMBULATORY_CARE_PROVIDER_SITE_OTHER): Payer: Medicare Other | Admitting: Internal Medicine

## 2021-05-28 ENCOUNTER — Encounter: Payer: Self-pay | Admitting: Internal Medicine

## 2021-05-28 VITALS — BP 126/74 | HR 89 | Ht 62.0 in | Wt 179.0 lb

## 2021-05-28 DIAGNOSIS — E785 Hyperlipidemia, unspecified: Secondary | ICD-10-CM | POA: Diagnosis not present

## 2021-05-28 DIAGNOSIS — E1142 Type 2 diabetes mellitus with diabetic polyneuropathy: Secondary | ICD-10-CM

## 2021-05-28 DIAGNOSIS — E1122 Type 2 diabetes mellitus with diabetic chronic kidney disease: Secondary | ICD-10-CM

## 2021-05-28 DIAGNOSIS — E1165 Type 2 diabetes mellitus with hyperglycemia: Secondary | ICD-10-CM | POA: Diagnosis not present

## 2021-05-28 DIAGNOSIS — R739 Hyperglycemia, unspecified: Secondary | ICD-10-CM

## 2021-05-28 DIAGNOSIS — N1832 Chronic kidney disease, stage 3b: Secondary | ICD-10-CM

## 2021-05-28 LAB — POCT GLYCOSYLATED HEMOGLOBIN (HGB A1C): Hemoglobin A1C: 9 % — AB (ref 4.0–5.6)

## 2021-05-28 LAB — POCT GLUCOSE (DEVICE FOR HOME USE): Glucose Fasting, POC: 382 mg/dL — AB (ref 70–99)

## 2021-05-28 MED ORDER — RYBELSUS 7 MG PO TABS: 7.0000 mg | ORAL_TABLET | Freq: Every day | ORAL | 6 refills | Status: DC

## 2021-05-28 NOTE — Progress Notes (Signed)
`     Name: Nancy [name]  Age/ Sex: 81 y.o., female   MRN/ DOB: [id number], Nov 22, 1940     PCP: Buzzy Han, MD (Inactive)   Reason for Endocrinology Evaluation: Type 2 Diabetes Mellitus  Initial Endocrine Consultative Visit: 10/31/2017    PATIENT IDENTIFIER: Nancy [name] is a 81 y.o. female with a past medical history of HTN, Gout, Hyperlipidemia, and T2 DM . The patient has followed with Endocrinology clinic since 10/31/17 for consultative assistance with management of her diabetes.  DIABETIC HISTORY:  Nancy [name] was diagnosed with T2DM ~ in 2009, she has been on oral glycemic agents since her diagnosis. Has not been on insulin prior to her presentation here. Her hemoglobin A1c has ranged from 7.3% in 09/2015, peaking at 8.6% in 08/2015.  Glipizide stopped 06/2019 with an A1c of 6.0 %  Started Farxiga 09/24/2020  SUBJECTIVE:   During the last visit (01/28/2021): Her A1c was 7.8%.  continued  Metformin, decreased Januvia and increased [place]     Today (05/28/2021): [name] is here for a  follow up on diabetes management. She has not been checking glucose at home .    Patient had an ED visit for syncope April/19/2023 as well as AKI and CHF.  Syncope was attributed to be vasovagal in nature  She had 2  knee injections yesterday  She has occasional dizziness but no syncope   Last night she had 2 donuts  Denies nausea or vomiting    HOME DIABETES REGIMEN:  Metfomrin 1000 mg daily  Januvia 50 mg daily  Farxiga 10 mg daily    METER DOWNLOAD SUMMARY: N/A     DIABETIC COMPLICATIONS: Microvascular complications:  Neuropathy Denies: retinopathy, nephropathy Last eye exam: Completed 08/2020     Macrovascular complications:    Denies: CAD, PVD, CVA    HISTORY:  Past Medical History:  Past Medical History:  Diagnosis Date   Allergy    Anemia    Aortic atherosclerosis ([place])    Breast cyst    left breast   CAD (coronary artery  disease), native coronary artery    minimal ASCAD to the distal LM and mid LAD with 0-24% stenosis and Calcium score of 26.   Chest pain    CKD (chronic kidney disease), stage III (HCC)    Complication of anesthesia 1986   slow to awaken after cholecystectomy   Diastolic dysfunction    with mild pulmonary HTN by echo 07/2011   Gastric ulcer    GERD (gastroesophageal reflux disease)    Gout    Hyperlipidemia    Hypertension    Neuropathy    from diabetes per patient   Osteoarthritis    Osteopenia    Shortness of breath dyspnea    with activity   Sinus pause    Spine deformity    lower spine   Type II diabetes mellitus ([place])    Past Surgical History:  Past Surgical History:  Procedure Laterality Date   ABDOMINAL ADHESION SURGERY     ABDOMINAL HYSTERECTOMY     ANUS SURGERY     for torn tissue   APPENDECTOMY     BREAST BIOPSY     BREAST LUMPECTOMY     left   CHOLECYSTECTOMY  1986   COLONOSCOPY     ESOPHAGOGASTRODUODENOSCOPY (EGD) WITH PROPOFOL N/A 07/18/2013   Procedure: ESOPHAGOGASTRODUODENOSCOPY (EGD) WITH PROPOFOL;  Surgeon: Arta Silence, MD;  [facility];  Service: Endoscopy;  Laterality: N/A;   EUS N/A  07/18/2013   Procedure: ESOPHAGEAL ENDOSCOPIC ULTRASOUND (EUS) RADIAL;  Surgeon: Arta Silence, MD;  [facility];  Service: Endoscopy;  Laterality: N/A;   IR GENERIC HISTORICAL  10/30/2015   IR US GUIDE VASC ACCESS RIGHT 10/30/2015 Saverio Danker, PA-C [facility]   IR GENERIC HISTORICAL  10/30/2015   IR FLUORO GUIDE CV LINE RIGHT 10/30/2015 Saverio Danker, PA-C [facility]   LUMBAR WOUND DEBRIDEMENT N/A 10/01/2015   Procedure: LUMBAR WOUND DEBRIDEMENT;  Surgeon: Melina Schools, MD;  Location: [place];  Service: Orthopedics;  Laterality: N/A;   LUMBAR WOUND DEBRIDEMENT N/A 10/15/2015   Procedure: [facility] OUT AND CLOSURE OF BACK WOUND;  Surgeon: Melina Schools, MD;  Location: [place];  Service: Orthopedics;  Laterality: N/A;   MASS EXCISION Left 07/24/2015    Procedure: EXCISION MASS LEFT LONG FINGER;  Surgeon: Leanora Cover, MD;  Location: [place];  Service: Orthopedics;  Laterality: Left;   SPINAL FUSION N/A 09/17/2015   Procedure: FUSION POSTERIOR SPINAL MULTILEVEL L4- S1;  Surgeon: Melina Schools, MD;  Location: [place];  Service: Orthopedics;  Laterality: N/A;   TRANSFORAMINAL LUMBAR INTERBODY FUSION (TLIF) WITH PEDICLE SCREW FIXATION 1 LEVEL N/A 09/17/2015   Procedure: TRANSFORAMINAL LUMBAR INTERBODY FUSION (TLIF) WITH PEDICLE SCREW FIXATION 1 LEVEL , Lumbar 4-5;  Surgeon: Melina Schools, MD;  Location: [place];  Service: Orthopedics;  Laterality: N/A;   Social History:  reports that she has never smoked. She has never used smokeless tobacco. She reports that she does not drink alcohol and does not use drugs. Family History:  Family History  Problem Relation Age of Onset   Heart disease Mother    Cancer Sister        ovarian     HOME MEDICATIONS: Allergies as of 05/28/2021       Reactions   Ketorolac Tromethamine Swelling   10/02/15 - given Toradol + morphine and RN noted tongue swelling - given Epi + Benadryl Has taken ibuprofen in past w/o problem Other reaction(s): Unknown   Morphine Swelling   Face and tongue swelling   Morphine Sulfate    Other reaction(s): Unknown   Codeine Other (See Comments)   headache Other reaction(s): Unknown        Medication List        Accurate as of May 28, 2021 10:29 AM. If you have any questions, ask your nurse or doctor.          allopurinol 300 MG tablet Commonly known as: ZYLOPRIM Take 300 mg by mouth daily.   amLODipine 5 MG tablet Commonly known as: NORVASC Take 5 mg by mouth daily.   aspirin EC 81 MG tablet Take 81 mg by mouth daily.   atorvastatin 10 MG tablet Commonly known as: LIPITOR Take 1 tablet (10 mg total) by mouth daily. Please make overdue appt with Dr.Turner before anymore refills. Thank you 1st attempt   colchicine 0.6 MG tablet Take 0.6 mg by  mouth daily.   dapagliflozin propanediol 10 MG Tabs tablet Commonly known as: Farxiga Take 1 tablet (10 mg total) by mouth daily before breakfast.   Durezol 0.05 % Emul Generic drug: Difluprednate Apply 1-2 drops to eye as directed. For dry eyes   famotidine 20 MG tablet Commonly known as: PEPCID Take 20 mg by mouth 2 (two) times daily.   furosemide 20 MG tablet Commonly known as: LASIX Take 1 tablet (20 mg total) by mouth daily as needed (for weight gain of >3lbs in 1 day or >5lbs in  1 week).   gabapentin 300 MG capsule Commonly known as: NEURONTIN Take 600 mg by mouth 3 (three) times daily.   glucose blood test strip Use as instructed to test blood sugars 2 times daily E11.65   losartan 50 MG tablet Commonly known as: COZAAR Take 50 mg by mouth daily.   metFORMIN 1000 MG tablet Commonly known as: GLUCOPHAGE Take 1 tablet (1,000 mg total) by mouth daily with breakfast.   metoprolol tartrate 50 MG tablet Commonly known as: LOPRESSOR Take 0.5 tablets (25 mg total) by mouth 2 (two) times daily. What changed: how much to take   Multiple Vitamins tablet Take 1 tablet by mouth daily.   omeprazole 20 MG capsule Commonly known as: PRILOSEC Take 20 mg by mouth daily as needed (acid reflux).   OneTouch Verio IQ System w/Device Kit 1 kit by Does not apply route 2 (two) times daily. E11.65   sitaGLIPtin 50 MG tablet Commonly known as: Januvia Take 1 tablet (50 mg total) by mouth daily.   tiZANidine 4 MG tablet Commonly known as: ZANAFLEX Take 4 mg by mouth 2 (two) times daily as needed for muscle spasms.   traMADol 50 MG tablet Commonly known as: ULTRAM Take 50 mg by mouth every 6 (six) hours as needed for moderate pain or severe pain.         OBJECTIVE:   Vital Signs: BP 126/74 (BP Location: Left Arm, Patient Position: Sitting, Cuff Size: Large)   Pulse 89   Ht _0  (1.575 m)   Wt 179 lb (81.2 kg)   SpO2 96%   BMI 32.74 kg/m   Wt Readings from Last 3  Encounters:  05/28/21 179 lb (81.2 kg)  04/28/21 177 lb (80.3 kg)  04/14/21 180 lb 3.2 oz (81.7 kg)     Exam: General: Pt appears well and is in NAD  Lungs: Clear with good BS bilat with no rales, rhonchi, or wheezes  Heart: RRR   Extremities: No  pretibial edema.   Neuro: MS is good with appropriate affect, pt is alert and Ox3    DM foot exam: 01/28/2021  The skin of the feet is intact without sores or ulcerations. The pedal pulses are 2+ on right and 2+ on left. The sensation is decreased  to a screening 5.07, 10 gram monofilament at the left great toe         DATA REVIEWED:  Latest Reference Range & Units 04/29/21 06:30  Sodium 135 - 145 mmol/L 140  Potassium 3.5 - 5.1 mmol/L 3.6  Chloride 98 - 111 mmol/L 107  CO2 22 - 32 mmol/L 24  Glucose 70 - 99 mg/dL 106 (H)  BUN 8 - 23 mg/dL 35 (H)  Creatinine 0.44 - 1.00 mg/dL 1.13 (H)  Calcium 8.9 - 10.3 mg/dL 9.2  Anion gap 5 - 15  9  Alkaline Phosphatase 38 - 126 U/L 77  Albumin 3.5 - 5.0 g/dL 3.2 (L)  AST 15 - 41 U/L 39  ALT 0 - 44 U/L 33  Total Protein 6.5 - 8.1 g/dL 7.1  Total Bilirubin 0.3 - 1.2 mg/dL 0.9  GFR, Estimated >60 mL/min 49 (L)    Latest Reference Range & Units 01/28/21 10:36  Total CHOL/HDL Ratio  4  Cholesterol 0 - 200 mg/dL 124  HDL Cholesterol >39.00 mg/dL 32.40 (L)  LDL (calc) 0 - 99 mg/dL 71  MICROALB/CREAT RATIO 0.0 - 30.0 mg/g 2.2  NonHDL  91.84  Triglycerides 0.0 - 149.0 mg/dL 104.0  VLDL  0.0 - 40.0 mg/dL 20.8     Latest Reference Range & Units 01/28/21 10:36  Creatinine,U mg/dL 125.8  Microalb, Ur 0.0 - 1.9 mg/dL 2.8 (H)  MICROALB/CREAT RATIO 0.0 - 30.0 mg/g 2.2   ASSESSMENT / PLAN / RECOMMENDATIONS:   1) Type 2 Diabetes Mellitus, Poorly controlled, With Neuropathic complications - Most recent A1c of 9.0 %. Goal A1c < 7.0 %.    - Poorly controlled diabetes due to dietary  indiscretions and glucocorticoid intra-articular injections , pt ate donuts last night , she also had  -  Encouraged  glucose checks at home  - We had reduced her metformin by 50% in the past due to GFR less than 45  -Her GFR has trended down, I will encourage hydration, she is on appropriate dose of metformin with a GFR >35 .  -Will stop Januvia and replace with Rybelsus , no hx of pancreatitis  - Cautioned against GI side effects   MEDICATIONS:  - Stop Januvia  - Start Rybelsus 3 mg daily ( samples provided) , than increase to 7 mg daily  -Continue metformin 1000 mg ONCE daily  -Continue  Farxiga 10 mg daily   EDUCATION / INSTRUCTIONS: BG monitoring instructions: Patient is instructed to check her blood sugars 1 times a day, fasting  Call [facility] if: BG persistently < 70  I reviewed the Rule of 15 for the treatment of hypoglycemia in detail with the patient. Literature supplied.   2) Diabetic complications:  Eye: Does not have known diabetic retinopathy.  Neuro/ Feet: Does  have known diabetic peripheral neuropathy. Renal: Patient does  have known baseline CKD. She is on an ACEI/ARB at present. Normal Ma/Cr ratio     3) Dyslipidemia:  -LDL at goal   Medication Continue atorvastatin 10 mg daily    F/U in 4 months   Signed electronically by: Mack Guise, MD  [facility]  [address] [address] [place], [place] [address] Phone: [phone] FAX: [phone]   CC:  Buzzy Han, MD (Inactive) No address on file Phone: None  Fax: None  Return to Endocrinology clinic as below: Future Appointments  Date Time Provider [place]  06/03/2021  9:15 AM Charlie Pitter, PA-C [facility]  06/26/2021 11:15 AM Trula Slade, DPM [facility]

## 2021-05-28 NOTE — Patient Instructions (Signed)
-   STOP Januvia  - Start Rybelsus 3 mg, 1 tablet every morning (thirty minutes before Breakfast ), after you are finished with the sample pick  up prescription Rybelsus 7 mg daily  - Continue  Metformin 1000 mg one tablet day  - Continue  Farxiga 10  mg, 1 tablet daliy       - HOW TO TREAT LOW BLOOD SUGARS (Blood sugar LESS THAN 70 MG/DL) Please follow the RULE OF 15 for the treatment of hypoglycemia treatment (when your (blood sugars are less than 70 mg/dL)   STEP 1: Take 15 grams of carbohydrates when your blood sugar is low, which includes:  3-4 GLUCOSE TABS  OR 3-4 OZ OF JUICE OR REGULAR SODA OR ONE TUBE OF GLUCOSE GEL    STEP 2: RECHECK blood sugar in 15 MINUTES STEP 3: If your blood sugar is still low at the 15 minute recheck --> then, go back to STEP 1 and treat AGAIN with another 15 grams of carbohydrates.

## 2021-06-02 ENCOUNTER — Encounter: Payer: Self-pay | Admitting: Physician Assistant

## 2021-06-02 NOTE — Progress Notes (Unsigned)
Cardiology Office Note    Date:  06/03/2021   ID:  Nancy [name], Matthew 06-25-1940, MRN [id number]  PCP:  Buzzy Han, MD (Inactive)  Cardiologist:  Fransico Him, MD  Electrophysiologist:  None   Chief Complaint: f/u syncope  History of Present Illness:   Nancy [name] is a 81 y.o. female with history of hypertension, hypertensive heart disease, sleep apnea on CPAP (followed by Dr. Radford Pax, last assessed 04/2021), gastric ulcer, GERD, hyperlipidemia, neuropathy from diabetes, osteoarthritis, PVCs, previous mild pulmonary HTN, spine deformity, chronic appearing anemia, chronic kidney disease stage 3a, mild coronary atherosclerosis, brief atrial tachycardia and sinus pauses who is seen for follow-up.   She has previously followed with our cardiology team for chest pain, SOB, palpitations and dizziness. Remote stress testing was normal. Remote monitor 2019 showed HR 50-164bpm, occasional PACs/PVCs. Repeat event monitor 07/2019 showed sinus bradycardia, sinus rhythm, and sinus tachycardia with average heart rate 71 bpm, range 58-103, and 1 episode nonsustained atrial tachycardia up to 5 beats in a row. There were 2 episodes of sinus pauses up to 3.5 seconds (once occurred during sleep at 30 bpm and the other occurred in the evening around 8:37 with heart rate 30 bpm). Palpitations correlated with NSR. As she was having some dizziness around follow-up of that monitor, metoprolol dose was reduced. Coronary CT in 09/2019 showed minimal coronary atherosclerosis. 2D echo 11/2019 demonstrated severe asymmetric basal septal hypertrophy so cMRI was pursued 12/2019 showing no evidence of infiltrative heart disease, findings felt c/w hypertensive heart disease. Of note, pulm HTN was previously reported on remote echo but not the case in more recent testing.  She was admitted 04/2021 with syncope on the commode felt vasovagal in etiology. She also was found to have hypotension, UTI, and AKI  requiring hydration, antibiotics, and discontinuation of loartan. 2D echo 04/29/21 showed EF 60-65%, moderate asymmetric LVH of basal/septal segments. She underwent repeat outpatient coronary CTA showing mild nonobstructive CAD. Overread showed small hiatal hernia, aortic atherosclerosis and possible bronchomalacia for which Dr. Radford Pax recommended f/u PCP. Dr. Radford Pax felt no further cardiac workup was necessary but did suggest that if she had another episode we would consider repeat monitor versus ILR.  She is seen for follow-up today feeling well from cardiac standpoint. She is not having any anginal type chest pain, dyspnea, palpitations, edema, or dizziness and has not had any recurrent syncope or near-syncope. She recently had repeat labs at PCP last week but does not know what they checked. She'll be going to see them again for follow-up in a few weeks ([facility]). She notes chronic fatigue for several years and is compliant with her CPAP.  Labwork independently reviewed: 04/2021 Hgb 11.7, plt 220, K 3.6, Cr 1.13, albumin 3.2, TSH wnl 01/2021 LDL 71, trig 104  Cardiology Studies:   Studies reviewed are outlined and summarized above. Reports included below if pertinent.   Echo 04/2021    1. Left ventricular ejection fraction, by estimation, is 60 to 65%. The  left ventricle has normal function. The left ventricle has no regional  wall motion abnormalities. There is moderate asymmetric left ventricular  hypertrophy of the basal and septal  segments. Left ventricular diastolic parameters were normal. The average  left ventricular global longitudinal strain is -27.9 %. The global  longitudinal strain is normal.   2. Right ventricular systolic function is normal. The right ventricular  size is normal.   3. The mitral valve is normal in structure. No evidence of mitral valve  regurgitation. No evidence of mitral stenosis. Moderate mitral annular  calcification.   4. The aortic valve is  tricuspid. There is mild calcification of the  aortic valve. There is mild thickening of the aortic valve. Aortic valve  regurgitation is not visualized. Aortic valve sclerosis is present, with  no evidence of aortic valve stenosis.   5. The inferior vena cava is normal in size with greater than 50%  respiratory variability, suggesting right atrial pressure of 3 mmHg.   Cor CT 04/2021 EXAM: Cardiac/Coronary CTA   TECHNIQUE: The patient was scanned on a [employer].   FINDINGS: A 100 kV prospective scan was triggered in the descending thoracic aorta at 111 HU's. Axial non-contrast 3 mm slices were carried out through the heart. The data set was analyzed on a dedicated work station and scored using the [place]. Gantry rotation speed was 250 msecs and collimation was .6 mm. 0.8 mg of sl NTG was given. The 3D data set was reconstructed in 5% intervals of the 35-75% of the R-R cycle. Phases were analyzed on a dedicated work station using MPR, MIP and VRT modes. The patient received 80 cc of contrast.   Coronary Arteries:  Normal coronary origin.  Right dominance.   RCA is a large dominant artery that gives rise to PDA and PLA. There is noncalcified plaque in mid RCA causing 25-49% stenosis   Left main is a large artery that gives rise to LAD and LCX arteries. Calcified plaque in the distal left main causes 0-24% stenosis   LAD is a large vessel. Calcified plaque in proximal LAD causes 0-24% stenosis. Calcified plaque in mid LAD causes 0-24% stenosis.   LCX is a non-dominant artery that gives rise to one large OM1 branch. There is calcified plaque in ostial LCX causing 0-24% stenosis.   Other findings:   Left Ventricle: Mild enlargement   Left Atrium: Normal size   Pulmonary Veins: Normal configuration   Right Ventricle: Normal size   Right Atrium: Normal size   Cardiac valves: Mild MAC   Thoracic aorta: Normal size   Pulmonary Arteries: Normal  size   Systemic Veins: Normal drainage   Pericardium: Normal thickness   IMPRESSION: 1. Coronary calcium score of 48. This was 52nd percentile for age and sex matched control.   2.  Normal coronary origin with right dominance.   3. Nonobstructive CAD. Most significant lesion is noncalcified plaque in mid RCA causing mild (25-49%) stenosis   CAD-RADS 2. Mild non-obstructive CAD (25-49%). Consider non-atherosclerotic causes of chest pain. Consider preventive therapy and risk factor modification.   Electronically Signed: By: Oswaldo Milian M.D. On: 04/29/2021 16:53     Monitor 09/2019 Sinus bradycardia, normal sinus rhythm and sinus tachycardia. The average heart rate was 71bpm and ranged from 58-103bpm. Nonsutained atrial tachycardia up to 5 beats in a row. 2 episodes of sinus pauses with 3.5 second pauses. Once occurred during sleep at 30bpm and the other one occurred in the evening around 8:37pm with heart rate 30bpm.    Past Medical History:  Diagnosis Date   Allergy    Anemia    Aortic atherosclerosis (HCC)    Atrial tachycardia (HCC)    Breast cyst    left breast   CAD (coronary artery disease), native coronary artery    minimal ASCAD to the distal LM and mid LAD with 0-24% stenosis and Calcium score of 26.   Chest pain    CKD (chronic kidney disease), stage III ([place])  Complication of anesthesia 1986   slow to awaken after cholecystectomy   Diastolic dysfunction    with mild pulmonary HTN by echo 07/2011   Gastric ulcer    GERD (gastroesophageal reflux disease)    Gout    Hyperlipidemia    Hypertension    Hypertensive heart disease    Neuropathy    from diabetes per patient   Osteoarthritis    Osteopenia    Shortness of breath dyspnea    with activity   Sinus pause    Spine deformity    lower spine   Type II diabetes mellitus ([place])     Past Surgical History:  Procedure Laterality Date   ABDOMINAL ADHESION SURGERY     ABDOMINAL HYSTERECTOMY      ANUS SURGERY     for torn tissue   APPENDECTOMY     BREAST BIOPSY     BREAST LUMPECTOMY     left   CHOLECYSTECTOMY  1986   COLONOSCOPY     ESOPHAGOGASTRODUODENOSCOPY (EGD) WITH PROPOFOL N/A 07/18/2013   Procedure: ESOPHAGOGASTRODUODENOSCOPY (EGD) WITH PROPOFOL;  Surgeon: Arta Silence, MD;  Location: [facility];  Service: Endoscopy;  Laterality: N/A;   EUS N/A 07/18/2013   Procedure: ESOPHAGEAL ENDOSCOPIC ULTRASOUND (EUS) RADIAL;  Surgeon: Arta Silence, MD;  Location: [facility];  Service: Endoscopy;  Laterality: N/A;   IR GENERIC HISTORICAL  10/30/2015   IR US GUIDE VASC ACCESS RIGHT 10/30/2015 Saverio Danker, PA-C [facility]   IR GENERIC HISTORICAL  10/30/2015   IR FLUORO GUIDE CV LINE RIGHT 10/30/2015 Saverio Danker, PA-C [facility]   LUMBAR WOUND DEBRIDEMENT N/A 10/01/2015   Procedure: LUMBAR WOUND DEBRIDEMENT;  Surgeon: Melina Schools, MD;  Location: [place];  Service: Orthopedics;  Laterality: N/A;   LUMBAR WOUND DEBRIDEMENT N/A 10/15/2015   Procedure: [facility] OUT AND CLOSURE OF BACK WOUND;  Surgeon: Melina Schools, MD;  Location: [place];  Service: Orthopedics;  Laterality: N/A;   MASS EXCISION Left 07/24/2015   Procedure: EXCISION MASS LEFT LONG FINGER;  Surgeon: Leanora Cover, MD;  Location: [place];  Service: Orthopedics;  Laterality: Left;   SPINAL FUSION N/A 09/17/2015   Procedure: FUSION POSTERIOR SPINAL MULTILEVEL L4- S1;  Surgeon: Melina Schools, MD;  Location: [place];  Service: Orthopedics;  Laterality: N/A;   TRANSFORAMINAL LUMBAR INTERBODY FUSION (TLIF) WITH PEDICLE SCREW FIXATION 1 LEVEL N/A 09/17/2015   Procedure: TRANSFORAMINAL LUMBAR INTERBODY FUSION (TLIF) WITH PEDICLE SCREW FIXATION 1 LEVEL , Lumbar 4-5;  Surgeon: Melina Schools, MD;  Location: [place];  Service: Orthopedics;  Laterality: N/A;    Current Medications: Current Meds  Medication Sig   allopurinol (ZYLOPRIM) 300 MG tablet Take 300 mg by mouth daily.    amLODipine (NORVASC) 5 MG tablet Take 5  mg by mouth daily.   aspirin EC 81 MG tablet Take 81 mg by mouth daily.   atorvastatin (LIPITOR) 10 MG tablet Take 1 tablet by mouth once daily   Blood Glucose Monitoring Suppl (ONETOUCH VERIO IQ SYSTEM) w/Device KIT 1 kit by Does not apply route 2 (two) times daily. E11.65   colchicine 0.6 MG tablet Take 0.6 mg by mouth daily.   dapagliflozin propanediol (FARXIGA) 10 MG TABS tablet Take 1 tablet (10 mg total) by mouth daily before breakfast.   DUREZOL 0.05 % EMUL Apply 1-2 drops to eye as directed. For dry eyes   famotidine (PEPCID) 20 MG tablet Take 20 mg by mouth 2 (two) times daily.   furosemide (LASIX) 20 MG tablet Take  1 tablet (20 mg total) by mouth daily as needed (for weight gain of >3lbs in 1 day or >5lbs in 1 week).   gabapentin (NEURONTIN) 300 MG capsule Take 600 mg by mouth 3 (three) times daily.   glucose blood test strip Use as instructed to test blood sugars 2 times daily E11.65   metFORMIN (GLUCOPHAGE) 1000 MG tablet Take 1 tablet (1,000 mg total) by mouth daily with breakfast.   metoprolol tartrate (LOPRESSOR) 50 MG tablet Take 0.5 tablets (25 mg total) by mouth 2 (two) times daily.   Multiple Vitamins tablet Take 1 tablet by mouth daily.   omeprazole (PRILOSEC) 20 MG capsule Take 20 mg by mouth daily as needed (acid reflux).   Semaglutide (RYBELSUS) 7 MG TABS Take 7 mg by mouth daily.   tiZANidine (ZANAFLEX) 4 MG tablet Take 4 mg by mouth 2 (two) times daily as needed for muscle spasms.   traMADol (ULTRAM) 50 MG tablet Take 50 mg by mouth every 6 (six) hours as needed for moderate pain or severe pain.   [DISCONTINUED] losartan (COZAAR) 50 MG tablet Take 50 mg by mouth daily.     Allergies:   Ketorolac tromethamine, Morphine, Morphine sulfate, and Codeine   Social History   Socioeconomic History   Marital status: Divorced    Spouse name: Not on file   Number of children: 4   Years of education: 12   Highest education level: Not on file  Occupational History   Not on  file  Tobacco Use   Smoking status: Never   Smokeless tobacco: Never  Vaping Use   Vaping Use: Never used  Substance and Sexual Activity   Alcohol use: No    Comment: occasional wine   Drug use: No   Sexual activity: Not Currently  Other Topics Concern   Not on file  Social History Narrative   Patient is single, has 3 children living 1 deceased   Patient is right handed   Education level is 12   Caffeine consumption is 0   Social Determinants of Radio broadcast assistant Strain: Not on file  Food Insecurity: Not on file  Transportation Needs: Not on file  Physical Activity: Not on file  Stress: Not on file  Social Connections: Not on file     Family History:  The patient's family history includes Cancer in her sister; Heart disease in her mother.  ROS:   Please see the history of present illness. Otherwise, review of systems is positive for rare sharp fleeting pinch feeling in her chest, lasting only seconds.  All other systems are reviewed and otherwise negative.    EKG(s)/Additional Labs   EKG:  EKG is not ordered today  Recent Labs: 04/28/2021: Magnesium 1.9; TSH 1.672 04/29/2021: ALT 33; BUN 35; Creatinine, Ser 1.13; Hemoglobin 11.7; Platelets 220; Potassium 3.6; Sodium 140  Recent Lipid Panel    Component Value Date/Time   CHOL 124 01/28/2021 1036   CHOL 99 (L) 12/05/2019 0813   TRIG 104.0 01/28/2021 1036   HDL 32.40 (L) 01/28/2021 1036   HDL 31 (L) 12/05/2019 0813   CHOLHDL 4 01/28/2021 1036   VLDL 20.8 01/28/2021 1036   LDLCALC 71 01/28/2021 1036   LDLCALC 45 12/05/2019 0813    PHYSICAL EXAM:    VS:  BP 120/60 (BP Location: Left Arm, Patient Position: Sitting, Cuff Size: Normal)   Pulse 82   Ht _0  (1.575 m)   Wt 177 lb (80.3 kg)   SpO2 96%  BMI 32.37 kg/m   BMI: Body mass index is 32.37 kg/m.  GEN: Well nourished, well developed female in no acute distress HEENT: normocephalic, atraumatic Neck: no JVD, carotid bruits, or  masses Cardiac: RRR; no murmurs, rubs, or gallops, no edema  Respiratory:  clear to auscultation bilaterally, normal work of breathing GI: soft, nontender, nondistended, + BS MS: no deformity or atrophy Skin: warm and dry, no rash Neuro:  Alert and Oriented x 3, Strength and sensation are intact, follows commands Psych: euthymic mood, full affect  Wt Readings from Last 3 Encounters:  06/03/21 177 lb (80.3 kg)  05/28/21 179 lb (81.2 kg)  04/28/21 177 lb (80.3 kg)     ASSESSMENT & PLAN:   1. CAD with HLD goal LDL <70 - recent coronary CTA reviewed. She only has mild disease, so risk factor modification advised. She also had incidental finding of hiatal hernia, possible bronchomalacia for which Dr. Radford Pax had advised to f/u with primary care. She will be seeing them back in the next few weeks. Continue ASA, statin, metoprolol as tolerated. Her LDL was slightly above goal at 71 but very close (followed by endocrinology). Per our discussion she would like to trial risk factor modification rather than increase statin at this time. She is noted to be on chronic colchicine - given that this medicine has several drug interactions including statin, beta blocker, and amlodipine, I asked her to review with the prescribing provider whether this is intended to be a long term medicine at this dose. She had a prior history of gout/pseudogout and is on allopurinol.  2. History of syncope - as above, felt related to vasovagal etiology, Dr. Radford Pax recommended no further workup unless she had recurrent event. She is doing well but knows to notify for any recurrent symptoms. I will reach out to Dr. Radford Pax to inquire her thoughts on driving as the patient was not given any specific restrictions given the presumed etiology.  3. Hypertensive heart disease - stable, appears euvolemic. Given recent AKI, hypotension, she will remain off losartan for now. She has f/u with PCP. We will try to get a copy of updated labs they  did last week as well. She is not sure what they checked. She states they'll be checking more labwork when she sees them back. Of note, pulmonary HTN was remotely diagnosed on a remote echo but not demonstrated on more recent studies.  4. PVCs, PAT, h/o sinus pauses - quiescent, overall stable. She is on lower dose of metoprolol given the h/o sinus pauses. Hold off repeat event monitoring at this time per MD recommendation, with plan to revisit monitor versus loop recorder if symptoms were to recur.     Disposition: F/u with Dr. Radford Pax in 6 months. Recommend f/u PCP as above for ancillary findings on CT as well as chronic fatigue; does not appear to be cardiac in nature.   Medication Adjustments/Labs and Tests Ordered: Current medicines are reviewed at length with the patient today.  Concerns regarding medicines are outlined above. Medication changes, Labs and Tests ordered today are summarized above and listed in the Patient Instructions accessible in Encounters.   Signed, Charlie Pitter, PA-C  06/03/2021 9:49 AM    [place] Phone: [phone]; Fax: [phone]

## 2021-06-03 ENCOUNTER — Ambulatory Visit (INDEPENDENT_AMBULATORY_CARE_PROVIDER_SITE_OTHER): Payer: Medicare Other | Admitting: Physician Assistant

## 2021-06-03 ENCOUNTER — Telehealth: Payer: Self-pay | Admitting: *Deleted

## 2021-06-03 ENCOUNTER — Encounter: Payer: Self-pay | Admitting: Physician Assistant

## 2021-06-03 VITALS — BP 120/60 | HR 82 | Ht 62.0 in | Wt 177.0 lb

## 2021-06-03 DIAGNOSIS — I119 Hypertensive heart disease without heart failure: Secondary | ICD-10-CM

## 2021-06-03 DIAGNOSIS — I455 Other specified heart block: Secondary | ICD-10-CM

## 2021-06-03 DIAGNOSIS — I2510 Atherosclerotic heart disease of native coronary artery without angina pectoris: Secondary | ICD-10-CM

## 2021-06-03 DIAGNOSIS — E785 Hyperlipidemia, unspecified: Secondary | ICD-10-CM

## 2021-06-03 DIAGNOSIS — I471 Supraventricular tachycardia: Secondary | ICD-10-CM

## 2021-06-03 DIAGNOSIS — I493 Ventricular premature depolarization: Secondary | ICD-10-CM

## 2021-06-03 DIAGNOSIS — Z87898 Personal history of other specified conditions: Secondary | ICD-10-CM | POA: Diagnosis not present

## 2021-06-03 NOTE — Telephone Encounter (Signed)
S/w Joycelyn Schmid at [facility] where pt gets labs @ [phone].  Will fax recent labs to office.

## 2021-06-03 NOTE — Patient Instructions (Signed)
  Medication Instructions:     Stay off Losartan   *If you need a refill on your cardiac medications before your next appointment, please call your pharmacy*   Lab Work:   None ordered.   If you have labs (blood work) drawn today and your tests are completely normal, you will receive your results only by: [place] (if you have MyChart) OR A paper copy in the mail If you have any lab test that is abnormal or we need to change your treatment, we will call you to review the results.   Testing/Procedures:  None ordered.    Follow-Up: At [facility], you and your health needs are our priority.  As part of our continuing mission to provide you with exceptional heart care, we have created designated Provider Care Teams.  These Care Teams include your primary Cardiologist (physician) and Advanced Practice Providers (APPs -  Physician Assistants and Nurse Practitioners) who all work together to provide you with the care you need, when you need it.  We recommend signing up for the patient portal called "MyChart".  Sign up information is provided on this After Visit Summary.  MyChart is used to connect with patients for Virtual Visits (Telemedicine).  Patients are able to view lab/test results, encounter notes, upcoming appointments, etc.  Non-urgent messages can be sent to your provider as well.   To learn more about what you can do with MyChart, go to [email or web address].    Your next appointment:   6 month(s)  The format for your next appointment:   In Person  Provider:   Fransico Him, MD     Other Instructions  You are on a medicine called colchicine which can interact with several of your other medicines. Please touch base with the person who prescribes this medicine to clarify whether this is supposed to still be a long term medicine.  Important Information About Sugar

## 2021-06-03 NOTE — Telephone Encounter (Signed)
Received pt's faxed labs from [facility].

## 2021-06-05 ENCOUNTER — Telehealth: Payer: Self-pay | Admitting: Physician Assistant

## 2021-06-05 NOTE — Telephone Encounter (Signed)
Called patient with Nancy [name]'s message and patient verbalized understanding.

## 2021-06-05 NOTE — Telephone Encounter (Signed)
   Please let pt know that I discussed driving with Dr. Radford Pax after recent visit. Given that her episode of passing out was felt to be vasovagal in setting of dehydration that resolved, she does not feel any driving restrictions are needed at this time. Obviously remind her not to drive if feeling unwell and to seek medical attention if syncope recurs. Kween Bacorn PA-C

## 2021-06-26 ENCOUNTER — Ambulatory Visit (INDEPENDENT_AMBULATORY_CARE_PROVIDER_SITE_OTHER): Payer: Medicare Other | Admitting: Podiatry

## 2021-06-26 ENCOUNTER — Encounter: Payer: Self-pay | Admitting: Podiatry

## 2021-06-26 ENCOUNTER — Ambulatory Visit: Payer: Medicare Other | Admitting: Podiatry

## 2021-06-26 DIAGNOSIS — E1149 Type 2 diabetes mellitus with other diabetic neurological complication: Secondary | ICD-10-CM

## 2021-06-26 DIAGNOSIS — B351 Tinea unguium: Secondary | ICD-10-CM | POA: Diagnosis not present

## 2021-06-26 NOTE — Progress Notes (Signed)

## 2021-08-31 ENCOUNTER — Encounter: Payer: Self-pay | Admitting: Orthopedic Surgery

## 2021-08-31 ENCOUNTER — Ambulatory Visit (INDEPENDENT_AMBULATORY_CARE_PROVIDER_SITE_OTHER): Payer: Medicare Other | Admitting: Orthopedic Surgery

## 2021-08-31 DIAGNOSIS — M1711 Unilateral primary osteoarthritis, right knee: Secondary | ICD-10-CM | POA: Diagnosis not present

## 2021-08-31 DIAGNOSIS — M170 Bilateral primary osteoarthritis of knee: Secondary | ICD-10-CM | POA: Diagnosis not present

## 2021-08-31 DIAGNOSIS — M25561 Pain in right knee: Secondary | ICD-10-CM

## 2021-08-31 DIAGNOSIS — M1712 Unilateral primary osteoarthritis, left knee: Secondary | ICD-10-CM | POA: Diagnosis not present

## 2021-08-31 DIAGNOSIS — M25562 Pain in left knee: Secondary | ICD-10-CM

## 2021-08-31 MED ORDER — LIDOCAINE HCL 1 % IJ SOLN
5.0000 mL | INTRAMUSCULAR | Status: AC | PRN
  Administered 2021-08-31: 5 mL

## 2021-08-31 MED ORDER — METHYLPREDNISOLONE ACETATE 40 MG/ML IJ SUSP
40.0000 mg | INTRAMUSCULAR | Status: AC | PRN
  Administered 2021-08-31: 40 mg via INTRA_ARTICULAR

## 2021-08-31 MED ORDER — BUPIVACAINE HCL 0.25 % IJ SOLN
4.0000 mL | INTRAMUSCULAR | Status: AC | PRN
  Administered 2021-08-31: 4 mL via INTRA_ARTICULAR

## 2021-08-31 NOTE — Progress Notes (Signed)
Office Visit Note   Patient: Nancy [name]           Date of Birth: 06/26/1940           MRN: [id number] Visit Date: 08/31/2021 Requested by: No referring provider defined for this encounter. PCP: Buzzy Han, MD (Inactive)  Subjective: Chief Complaint  Patient presents with   Right Knee - Pain   Left Knee - Pain    HPI: Nancy [name] is a 81 year old patient with bilateral knee pain right equal to left.  Had bilateral knee injections on 05/27/2021.  Ambulating with a cane.  Reports that she feels like she is off balance at times.  Pain recurred about 2 to 3 weeks ago.  Would like to have repeat injections today.  Last hemoglobin A1c 9.05 2323.  She states that that has probably gone down.  The right knee pain is posterior to the left knee pain is primarily anterior medial.              ROS: All systems reviewed are negative as they relate to the chief complaint within the history of present illness.  Patient denies  fevers or chills.   Assessment & Plan: Visit Diagnoses:  1. Primary osteoarthritis of both knees     Plan: Impression is bilateral knee pain with bilateral knee arthritis.  Slight flexion contracture on the left indicates more severe arthritis on that side.  Left knee injected today.  Would like for her body to absorb that cortisone before injecting the right knee.  Come back in a week for right knee injection.  Left knee injected today.  No effusion.  Continue with nonweightbearing quad strengthening exercises.  Follow-Up Instructions: Return in about 1 week (around 09/07/2021).   Orders:  No orders of the defined types were placed in this encounter.  No orders of the defined types were placed in this encounter.     Procedures: Large Joint Inj: L knee on 08/31/2021 10:08 PM Indications: diagnostic evaluation, joint swelling and pain Details: 18 G 1.5 in needle, superolateral approach  Arthrogram: No  Medications: 5 mL lidocaine 1 %; 40 mg  methylPREDNISolone acetate 40 MG/ML; 4 mL bupivacaine 0.25 % Outcome: tolerated well, no immediate complications Procedure, treatment alternatives, risks and benefits explained, specific risks discussed. Consent was given by the patient. Immediately prior to procedure a time out was called to verify the correct patient, procedure, equipment, support staff and site/side marked as required. Patient was prepped and draped in the usual sterile fashion.       Clinical Data: No additional findings.  Objective: Vital Signs: There were no vitals taken for this visit.  Physical Exam:   Constitutional: Patient appears well-developed HEENT:  Head: Normocephalic Eyes:EOM are normal Neck: Normal range of motion Cardiovascular: Normal rate Pulmonary/chest: Effort normal Neurologic: Patient is alert Skin: Skin is warm Psychiatric: Patient has normal mood and affect   Ortho Exam: Ortho exam demonstrates range of motion on the left 10-1 10 range of motion on the right 5-1 10.  Extensor mechanism intact.  No effusion in either knee.  Patellofemoral crepitus is present.  Pedal pulses palpable.  Ankle dorsiflexion intact bilaterally.  No other masses lymphadenopathy or skin changes noted in the bilateral knee region.  No groin pain with internal/external rotation of the leg on either side.  Specialty Comments:  No specialty comments available.  Imaging: No results found.   PMFS History: Patient Active Problem List   Diagnosis Date Noted   Dyslipidemia 05/28/2021  Syncope 04/28/2021   UTI (urinary tract infection) 04/28/2021   AKI (acute kidney injury) ([place]) 04/28/2021   OSA on CPAP 04/28/2021   Aortic atherosclerosis ([place])    Type 2 diabetes mellitus with stage 3b chronic kidney disease, without long-term current use of insulin ([place]) 03/03/2020   Polyneuropathy associated with underlying disease ([place]) 03/03/2020   CAD (coronary artery disease), native coronary artery    Pulmonary  hypertension, unspecified ([place]) 07/29/2017   Chest pain 10/07/2016   SOB (shortness of breath) 10/07/2016   Pseudogout of bilateral knees 03/16/2016   Osteoarthritis 03/16/2016   SIRS (systemic inflammatory response syndrome) ([place]) 03/14/2016   Elevated sed rate 03/14/2016   Primary osteoarthritis of both knees 01/01/2016   Postoperative wound infection 12/18/2015   Difficulty in walking, not elsewhere classified    Status post lumbar spinal fusion    Type 2 diabetes mellitus with diabetic polyneuropathy, without long-term current use of insulin ([place])    Infection due to ESBL-producing Escherichia coli    Angioedema    Hyperlipidemia 10/01/2015   Gait instability 02/19/2013   Pulmonary hyperinflation 01/17/2013   Chronic diastolic CHF (congestive heart failure) ([place])    Essential hypertension, benign 01/03/2013   Past Medical History:  Diagnosis Date   Allergy    Anemia    Aortic atherosclerosis (H[facility])    Atrial tachycardia ([place])    Breast cyst    left breast   CAD (coronary artery disease), native coronary artery    minimal ASCAD to the distal LM and mid LAD with 0-24% stenosis and Calcium score of 26.   Chest pain    CKD (chronic kidney disease), stage III (HCC)    Complication of anesthesia 1986   slow to awaken after cholecystectomy   Diastolic dysfunction    with mild pulmonary HTN by echo 07/2011   Gastric ulcer    GERD (gastroesophageal reflux disease)    Gout    Hyperlipidemia    Hypertension    Hypertensive heart disease    Neuropathy    from diabetes per patient   Osteoarthritis    Osteopenia    Shortness of breath dyspnea    with activity   Sinus pause    Spine deformity    lower spine   Type II diabetes mellitus ([employer])     Family History  Problem Relation Age of Onset   Heart disease Mother    Cancer Sister        ovarian    Past Surgical History:  Procedure Laterality Date   ABDOMINAL ADHESION SURGERY     ABDOMINAL HYSTERECTOMY     ANUS SURGERY      for torn tissue   APPENDECTOMY     BREAST BIOPSY     BREAST LUMPECTOMY     left   CHOLECYSTECTOMY  1986   COLONOSCOPY     ESOPHAGOGASTRODUODENOSCOPY (EGD) WITH PROPOFOL N/A 07/18/2013   Procedure: ESOPHAGOGASTRODUODENOSCOPY (EGD) WITH PROPOFOL;  Surgeon: Arta Silence, MD;  [facility];  Service: Endoscopy;  Laterality: N/A;   EUS N/A 07/18/2013   Procedure: ESOPHAGEAL ENDOSCOPIC ULTRASOUND (EUS) RADIAL;  Surgeon: Arta Silence, MD;  [facility];  Service: Endoscopy;  Laterality: N/A;   IR GENERIC HISTORICAL  10/30/2015   IR US GUIDE VASC ACCESS RIGHT 10/30/2015 Saverio Danker, PA-C [facility]   IR GENERIC HISTORICAL  10/30/2015   IR FLUORO GUIDE CV LINE RIGHT 10/30/2015 Saverio Danker, PA-C [facility]   LUMBAR WOUND DEBRIDEMENT N/A 10/01/2015   Procedure: LUMBAR WOUND  DEBRIDEMENT;  Surgeon: Melina Schools, MD;  Location: [place];  Service: Orthopedics;  Laterality: N/A;   LUMBAR WOUND DEBRIDEMENT N/A 10/15/2015   Procedure: [facility] OUT AND CLOSURE OF BACK WOUND;  Surgeon: Melina Schools, MD;  Location: [place];  Service: Orthopedics;  Laterality: N/A;   MASS EXCISION Left 07/24/2015   Procedure: EXCISION MASS LEFT LONG FINGER;  Surgeon: Leanora Cover, MD;  Location: [place];  Service: Orthopedics;  Laterality: Left;   SPINAL FUSION N/A 09/17/2015   Procedure: FUSION POSTERIOR SPINAL MULTILEVEL L4- S1;  Surgeon: Melina Schools, MD;  Location: [place];  Service: Orthopedics;  Laterality: N/A;   TRANSFORAMINAL LUMBAR INTERBODY FUSION (TLIF) WITH PEDICLE SCREW FIXATION 1 LEVEL N/A 09/17/2015   Procedure: TRANSFORAMINAL LUMBAR INTERBODY FUSION (TLIF) WITH PEDICLE SCREW FIXATION 1 LEVEL , Lumbar 4-5;  Surgeon: Melina Schools, MD;  Location: [place];  Service: Orthopedics;  Laterality: N/A;   Social History   Occupational History   Not on file  Tobacco Use   Smoking status: Never   Smokeless tobacco: Never  Vaping Use   Vaping Use: Never used  Substance and Sexual  Activity   Alcohol use: No    Comment: occasional wine   Drug use: No   Sexual activity: Not Currently

## 2021-09-10 ENCOUNTER — Ambulatory Visit: Payer: Medicare Other | Admitting: Orthopedic Surgery

## 2021-10-02 ENCOUNTER — Ambulatory Visit: Payer: Medicare Other | Admitting: Orthopedic Surgery

## 2021-10-05 ENCOUNTER — Ambulatory Visit (INDEPENDENT_AMBULATORY_CARE_PROVIDER_SITE_OTHER): Payer: Medicare Other | Admitting: Podiatry

## 2021-10-05 DIAGNOSIS — M79675 Pain in left toe(s): Secondary | ICD-10-CM

## 2021-10-05 DIAGNOSIS — E1149 Type 2 diabetes mellitus with other diabetic neurological complication: Secondary | ICD-10-CM | POA: Diagnosis not present

## 2021-10-05 DIAGNOSIS — G629 Polyneuropathy, unspecified: Secondary | ICD-10-CM

## 2021-10-05 DIAGNOSIS — B351 Tinea unguium: Secondary | ICD-10-CM | POA: Diagnosis not present

## 2021-10-05 DIAGNOSIS — M79674 Pain in right toe(s): Secondary | ICD-10-CM

## 2021-10-05 DIAGNOSIS — M722 Plantar fascial fibromatosis: Secondary | ICD-10-CM

## 2021-10-05 NOTE — Progress Notes (Unsigned)
Subjective: Chief Complaint  Patient presents with   Diabetes    Diabetic foot care, A1c- 9 BG- 120, Nail trim and callus, TX: P.T., Gabapentin, Patient states she is doing a little better since the last visit     81 y.o. returns the office today for painful, elongated, thickened toenails which she cannot trim herself. Denies any redness or drainage around the nails.    She did do PT but took a month off. She is going to call to reschedule as it was helping.  She is still on gabapentin. She states she cannot sleep without this.   Objective: AAO 3, NAD DP/PT pulses palpable, CRT less than 3 seconds Sensation decreased with SWMF.  Nails hypertrophic, dystrophic, elongated, brittle, discolored 10. There is tenderness overlying the nails 1-5 bilaterally. There is no surrounding erythema or drainage along the nail sites. No open lesions bilaterally. She does get some mild discomfort in the arch of the foot.  There is no area pinpoint tenderness.  Flexor, extensor tendons appear to be intact.  MMT 5/5. No pain with calf compression, swelling, warmth, erythema.  Assessment: Patient presents with symptomatic onychomycosis; neuropathy; gait instability; arch pain/plantar fasciitis  Plan: -Treatment options including alternatives, risks, complications were discussed -Nails sharply debrided 10 without complication/bleeding.   -She is going to restart physical therapy.  Also think she is experiencing some plantar fasciitis symptoms.  Discussed wearing shoes better supportive shoes wearing flatter shoes today but this is not a regular occurrence for her.  Discussed icing exercises daily as well as home stretching exercises. -Continue gabapentin for neuropathy   Trula Slade DPM

## 2021-10-06 ENCOUNTER — Other Ambulatory Visit (HOSPITAL_COMMUNITY): Payer: Self-pay

## 2021-10-06 ENCOUNTER — Encounter: Payer: Self-pay | Admitting: Internal Medicine

## 2021-10-06 ENCOUNTER — Ambulatory Visit (INDEPENDENT_AMBULATORY_CARE_PROVIDER_SITE_OTHER): Payer: Medicare Other | Admitting: Internal Medicine

## 2021-10-06 VITALS — BP 126/74 | HR 74 | Ht 62.0 in | Wt 176.0 lb

## 2021-10-06 DIAGNOSIS — E1165 Type 2 diabetes mellitus with hyperglycemia: Secondary | ICD-10-CM | POA: Diagnosis not present

## 2021-10-06 LAB — POCT GLYCOSYLATED HEMOGLOBIN (HGB A1C): Hemoglobin A1C: 6.3 % — AB (ref 4.0–5.6)

## 2021-10-06 MED ORDER — CARESENS N GLUCOSE TEST VI STRP: 1.0000 | ORAL_STRIP | Freq: Every day | 3 refills | Status: DC

## 2021-10-06 NOTE — Patient Instructions (Addendum)
-   Continue Rybelsus 7 mg daily  - Continue  Metformin 1000 mg one tablet day  - Continue  Farxiga 10  mg, 1 tablet daliy       - HOW TO TREAT LOW BLOOD SUGARS (Blood sugar LESS THAN 70 MG/DL) Please follow the RULE OF 15 for the treatment of hypoglycemia treatment (when your (blood sugars are less than 70 mg/dL)   STEP 1: Take 15 grams of carbohydrates when your blood sugar is low, which includes:  3-4 GLUCOSE TABS  OR 3-4 OZ OF JUICE OR REGULAR SODA OR ONE TUBE OF GLUCOSE GEL    STEP 2: RECHECK blood sugar in 15 MINUTES STEP 3: If your blood sugar is still low at the 15 minute recheck --> then, go back to STEP 1 and treat AGAIN with another 15 grams of carbohydrates. 

## 2021-10-06 NOTE — Progress Notes (Unsigned)
`     Name: Nancy [name]  Age/ Sex: 81 y.o., female   MRN/ DOB: [id number], 04/05/1940     PCP: [place]-Any[place], [place]un[place], MD (Inactive)   Reason for Endocrinology Evaluation: Type 2 Diabetes Mellitus  Initial Endocrine Consultative Visit: 10/31/2017    PATIENT IDENTIFIER: [name] is a 81 y.o. female with a past medical history of HTN, Gout, Hyperlipidemia, and T2 DM . The patient has followed with Endocrinology clinic since 10/31/17 for consultative assistance with management of her diabetes.  DIABETIC HISTORY:  Ms. Huguley was diagnosed with T2DM ~ in 2009, she has been on oral glycemic agents since her diagnosis. Has not been on insulin prior to her presentation here. Her hemoglobin A1c has ranged from 7.3% in 09/2015, peaking at 8.6% in 08/2015.  Glipizide stopped 06/2019 with an A1c of 6.0 %  Started Farxiga 09/24/2020 Started Rybelsus May 2023 SUBJECTIVE:   During the last visit (05/28/2021): A1c 9.0%     Today (10/06/2021): Ms. Bartles is here for a  follow up on diabetes management. She has been checking glucose occasionally.     She was seen by podiatry 10/05/2021 She was seen by spinal surgeon for sacroiliac joint dysfunction on 09/18/2021 Was seen by cardiology in May 2023 for nonobstructive CAD She has been noted with weight loss since her last visit in May,2023 Denies nausea, vomiting or diarrhea   She is on Losartan through nephrology  She had a transient episode of hematuria, being followed by PCP     HOME DIABETES REGIMEN:  Metfomrin 1000 mg daily  Rybelsus 7 mg daily Farxiga 10 mg daily    METER DOWNLOAD SUMMARY: unable to download  90 day average 140 mg/dL   Average 127 -140 mg/dL      DIABETIC COMPLICATIONS: Microvascular complications:  Neuropathy Denies: retinopathy, nephropathy Last eye exam: Completed 08/2020     Macrovascular complications:   CAD(nonobstructive CAD) Denies: PVD, CVA    HISTORY:  Past Medical History:   Past Medical History:  Diagnosis Date   Allergy    Anemia    Aortic atherosclerosis (HCC)    Atrial tachycardia (HCC)    Breast cyst    left breast   CAD (coronary artery disease), native coronary artery    minimal ASCAD to the distal LM and mid LAD with 0-24% stenosis and Calcium score of 26.   Chest pain    CKD (chronic kidney disease), stage III (HCC)    Complication of anesthesia 1986   slow to awaken after cholecystectomy   Diastolic dysfunction    with mild pulmonary HTN by echo 07/2011   Gastric ulcer    GERD (gastroesophageal reflux disease)    Gout    Hyperlipidemia    Hypertension    Hypertensive heart disease    Neuropathy    from diabetes per patient   Osteoarthritis    Osteopenia    Shortness of breath dyspnea    with activity   Sinus pause    Spine deformity    lower spine   Type II diabetes mellitus (HCC)    Past Surgical History:  Past Surgical History:  Procedure Laterality Date   ABDOMINAL ADHESION SURGERY     ABDOMINAL HYSTERECTOMY     ANUS SURGERY     for torn tissue   APPENDECTOMY     BREAST BIOPSY     BREAST LUMPECTOMY     left   CHOLECYSTECTOMY  1986   COLONOSCOPY     ESOPHAGOGASTRODUODENOSCOPY (  EGD) WITH PROPOFOL N/A 07/18/2013   Procedure: ESOPHAGOGASTRODUODENOSCOPY (EGD) WITH PROPOFOL;  Surgeon: Arta Silence, MD;  [facility];  Service: Endoscopy;  Laterality: N/A;   EUS N/A 07/18/2013   Procedure: ESOPHAGEAL ENDOSCOPIC ULTRASOUND (EUS) RADIAL;  Surgeon: Arta Silence, MD;  [facility];  Service: Endoscopy;  Laterality: N/A;   IR GENERIC HISTORICAL  10/30/2015   IR US GUIDE VASC ACCESS RIGHT 10/30/2015 Saverio Danker, PA-C [facility]   IR GENERIC HISTORICAL  10/30/2015   IR FLUORO GUIDE CV LINE RIGHT 10/30/2015 Saverio Danker, PA-C [facility]   LUMBAR WOUND DEBRIDEMENT N/A 10/01/2015   Procedure: LUMBAR WOUND DEBRIDEMENT;  Surgeon: Melina Schools, MD;  Location: [place];  Service: Orthopedics;  Laterality: N/A;    LUMBAR WOUND DEBRIDEMENT N/A 10/15/2015   Procedure: [facility] OUT AND CLOSURE OF BACK WOUND;  Surgeon: Melina Schools, MD;  Location: [place];  Service: Orthopedics;  Laterality: N/A;   MASS EXCISION Left 07/24/2015   Procedure: EXCISION MASS LEFT LONG FINGER;  Surgeon: Leanora Cover, MD;  Location: [place];  Service: Orthopedics;  Laterality: Left;   SPINAL FUSION N/A 09/17/2015   Procedure: FUSION POSTERIOR SPINAL MULTILEVEL L4- S1;  Surgeon: Melina Schools, MD;  Location: [place];  Service: Orthopedics;  Laterality: N/A;   TRANSFORAMINAL LUMBAR INTERBODY FUSION (TLIF) WITH PEDICLE SCREW FIXATION 1 LEVEL N/A 09/17/2015   Procedure: TRANSFORAMINAL LUMBAR INTERBODY FUSION (TLIF) WITH PEDICLE SCREW FIXATION 1 LEVEL , Lumbar 4-5;  Surgeon: Melina Schools, MD;  Location: [place];  Service: Orthopedics;  Laterality: N/A;   Social History:  reports that she has never smoked. She has never used smokeless tobacco. She reports that she does not drink alcohol and does not use drugs. Family History:  Family History  Problem Relation Age of Onset   Heart disease Mother    Cancer Sister        ovarian     HOME MEDICATIONS: Allergies as of 10/06/2021       Reactions   Ketorolac Tromethamine Swelling   10/02/15 - given Toradol + morphine and RN noted tongue swelling - given Epi + Benadryl Has taken ibuprofen in past w/o problem Other reaction(s): Unknown   Morphine Swelling   Face and tongue swelling   Morphine Sulfate    Other reaction(s): Unknown   Codeine Other (See Comments)   headache Other reaction(s): Unknown        Medication List        Accurate as of October 06, 2021  2:09 PM. If you have any questions, ask your nurse or doctor.          allopurinol 300 MG tablet Commonly known as: ZYLOPRIM Take 300 mg by mouth daily.   amLODipine 5 MG tablet Commonly known as: NORVASC Take 5 mg by mouth daily.   aspirin EC 81 MG tablet Take 81 mg by mouth daily.   atorvastatin 10  MG tablet Commonly known as: LIPITOR Take 1 tablet by mouth once daily   CareSens N Glucose Test test strip Generic drug: glucose blood 1 each by Other route daily in the afternoon. Use as instructed What changed:  how much to take how to take this when to take this additional instructions Changed by: Dorita Sciara, MD   colchicine 0.6 MG tablet Take 0.6 mg by mouth daily.   dapagliflozin propanediol 10 MG Tabs tablet Commonly known as: Farxiga Take 1 tablet (10 mg total) by mouth daily before breakfast.   Durezol 0.05 % Emul  Generic drug: Difluprednate Apply 1-2 drops to eye as directed. For dry eyes   famotidine 20 MG tablet Commonly known as: PEPCID Take 20 mg by mouth 2 (two) times daily.   furosemide 20 MG tablet Commonly known as: LASIX Take 1 tablet (20 mg total) by mouth daily as needed (for weight gain of >3lbs in 1 day or >5lbs in 1 week).   gabapentin 300 MG capsule Commonly known as: NEURONTIN Take 600 mg by mouth 3 (three) times daily.   losartan 25 MG tablet Commonly known as: COZAAR Take 25 mg by mouth at bedtime.   metFORMIN 1000 MG tablet Commonly known as: GLUCOPHAGE Take 1 tablet (1,000 mg total) by mouth daily with breakfast.   metoprolol tartrate 50 MG tablet Commonly known as: LOPRESSOR Take 0.5 tablets (25 mg total) by mouth 2 (two) times daily.   Multiple Vitamins tablet Take 1 tablet by mouth daily.   omeprazole 20 MG capsule Commonly known as: PRILOSEC Take 20 mg by mouth daily as needed (acid reflux).   OneTouch Verio IQ System w/Device Kit 1 kit by Does not apply route 2 (two) times daily. E11.65   Rybelsus 7 MG Tabs Generic drug: Semaglutide Take 7 mg by mouth daily.   tiZANidine 4 MG tablet Commonly known as: ZANAFLEX Take 4 mg by mouth 2 (two) times daily as needed for muscle spasms.   traMADol 50 MG tablet Commonly known as: ULTRAM Take 50 mg by mouth every 6 (six) hours as needed for moderate pain or severe  pain.         OBJECTIVE:   Vital Signs: BP 126/74 (BP Location: Left Arm, Patient Position: Sitting, Cuff Size: Large)   Pulse 74   Ht 5' 2" (1.575 m)   Wt 176 lb (79.8 kg)   SpO2 97%   BMI 32.19 kg/m   Wt Readings from Last 3 Encounters:  10/06/21 176 lb (79.8 kg)  06/03/21 177 lb (80.3 kg)  05/28/21 179 lb (81.2 kg)     Exam: General: Pt appears well and is in NAD  Lungs: Clear with good BS bilat with no rales, rhonchi, or wheezes  Heart: RRR   Extremities: No  pretibial edema.   Neuro: MS is good with appropriate affect, pt is alert and Ox3    DM foot exam: 01/28/2021  The skin of the feet is intact without sores or ulcerations. The pedal pulses are 2+ on right and 2+ on left. The sensation is decreased  to a screening 5.07, 10 gram monofilament at the left great toe         DATA REVIEWED:   Latest Reference Range & Units 04/29/21 06:30  Sodium 135 - 145 mmol/L 140  Potassium 3.5 - 5.1 mmol/L 3.6  Chloride 98 - 111 mmol/L 107  CO2 22 - 32 mmol/L 24  Glucose 70 - 99 mg/dL 106 (H)  BUN 8 - 23 mg/dL 35 (H)  Creatinine 0.44 - 1.00 mg/dL 1.13 (H)  Calcium 8.9 - 10.3 mg/dL 9.2  Anion gap 5 - 15  9  Alkaline Phosphatase 38 - 126 U/L 77  Albumin 3.5 - 5.0 g/dL 3.2 (L)  AST 15 - 41 U/L 39  ALT 0 - 44 U/L 33  Total Protein 6.5 - 8.1 g/dL 7.1  Total Bilirubin 0.3 - 1.2 mg/dL 0.9  GFR, Estimated >60 mL/min 49 (L)       Latest Reference Range & Units 01/28/21 10:36  Total CHOL/HDL Ratio  4  Cholesterol 0 -   200 mg/dL 124  HDL Cholesterol >39.00 mg/dL 32.40 (L)  LDL (calc) 0 - 99 mg/dL 71  MICROALB/CREAT RATIO 0.0 - 30.0 mg/g 2.2  NonHDL  91.84  Triglycerides 0.0 - 149.0 mg/dL 104.0  VLDL 0.0 - 40.0 mg/dL 20.8     Latest Reference Range & Units 01/28/21 10:36  Creatinine,U mg/dL 125.8  Microalb, Ur 0.0 - 1.9 mg/dL 2.8 (H)  MICROALB/CREAT RATIO 0.0 - 30.0 mg/g 2.2   ASSESSMENT / PLAN / RECOMMENDATIONS:   1) Type 2 Diabetes Mellitus, OPtimally  controlled, With Neuropathic and macrovascular complications - Most recent A1c of 6.3  %. Goal A1c < 7.0 %.    - A1c trended down 9.0% to 6.3 %  -Praised the patient improved glycemic control as well as weight loss - We had reduced her metformin by 50% in the past due to GFR less than 45 -No changes today  MEDICATIONS:   -Continue Rybelsus  7 mg daily  -Continue metformin 1000 mg ONCE daily  -Continue  Farxiga 10 mg daily   EDUCATION / INSTRUCTIONS: BG monitoring instructions: Patient is instructed to check her blood sugars 1 times a day, fasting  Call [place] [facility] clinic if: BG persistently < 70  I reviewed the Rule of 15 for the treatment of hypoglycemia in detail with the patient. Literature supplied.   2) Diabetic complications:  Eye: Does not have known diabetic retinopathy.  Neuro/ Feet: Does  have known diabetic peripheral neuropathy. Renal: Patient does  have known baseline CKD. She is on an ACEI/ARB at present. Normal Ma/Cr ratio     3) Dyslipidemia:  -LDL at goal   Medication Continue atorvastatin 10 mg daily    F/U in 4 months   Signed electronically by: Mack Guise, MD  [facility] End[facility]  [address] [place], [place] [address] Phone: [phone] FAX: [phone]   CC:  Buzzy Han, MD (Inactive) No address on file Phone: None  Fax: None  Return to Endocrinology clinic as below: Future Appointments  Date Time Provider [place]  12/07/2021  9:00 AM Sueanne Margarita, MD [facility]  01/07/2022  1:15 PM Jacqualyn Posey Bonna Gains, DPM [facility]

## 2021-10-07 MED ORDER — RYBELSUS 7 MG PO TABS: 7.0000 mg | ORAL_TABLET | Freq: Every day | ORAL | 3 refills | Status: DC

## 2021-10-07 MED ORDER — METFORMIN HCL 1000 MG PO TABS: 1000.0000 mg | ORAL_TABLET | Freq: Every day | ORAL | 3 refills | Status: DC

## 2021-10-07 MED ORDER — DAPAGLIFLOZIN PROPANEDIOL 10 MG PO TABS: 10.0000 mg | ORAL_TABLET | Freq: Every day | ORAL | 3 refills | Status: DC

## 2021-10-28 ENCOUNTER — Telehealth: Payer: Self-pay | Admitting: Orthopedic Surgery

## 2021-10-28 NOTE — Telephone Encounter (Signed)
Pt called to inform Dr Marlou Sa she is going to [facility]. She states she is unable to bend right knee. Pt phone number is [phone].

## 2021-10-28 NOTE — Telephone Encounter (Signed)
Got it thx

## 2021-12-06 NOTE — Progress Notes (Deleted)
Cardiology Office Note:    Date:  12/06/2021   ID:  Nancy [name], DOB February 16, 1940, MRN [id number]  PCP:  Buzzy Han, MD (Inactive)  Cardiologist:  Fransico Him, MD    Referring MD: No ref. provider found   No chief complaint on file.   History of Present Illness:    Nancy [name] is a 81 y.o. female with a hx of HTN and diastolic dysfunction .  She also has a history of mild pulmonary hypertension by 2D echocardiogram in July 2013.  She had a Lexiscan Myoview for chest pain 10/13/2016 which showed no inducible ischemia. 2D echo in 2019 showed  normal LVF with EF 60-65% with moderate LVH and elevated filling pressures. She also has a hx of OSA and is on PAP therapy.   She also has a history of atypical CP and DOE and coronary CTA was done which showed minimal CAD of the LM, mid and distal LAD and her CP was felt to be non cardiac in origin. 2D echo was normal with severe ASSH, no PHTN and cardiac MRI was done showing normal LVF with EF 73%, moderate BSH, normal RV and no evidence of cardiac amyloid or HOCM and findings c/w hypertensive heart disease.    She is here today for followup and is doing well.  She denies any chest pain or pressure, SOB, DOE, PND, orthopnea, LE edema, dizziness, palpitations or syncope. [place] is compliant with her meds and is tolerating meds with no SE.    She is doing well with her PAP device and thinks that she has gotten used to it.  She tolerates the mask and feels the pressure is adequate.  Since going on PAP she feels rested in the am and has no significant daytime sleepiness.  She denies any significant mouth or nasal dryness or nasal congestion.  She does not think that he snores.   Past Medical History:  Diagnosis Date   Allergy    Anemia    Aortic atherosclerosis (HCC)    Atrial tachycardia ([place])    Breast cyst    left breast   CAD (coronary artery disease), native coronary artery    minimal ASCAD to the distal LM and mid LAD  with 0-24% stenosis and Calcium score of 26.   Chest pain    CKD (chronic kidney disease), stage III (HCC)    Complication of anesthesia 1986   slow to awaken after cholecystectomy   Diastolic dysfunction    with mild pulmonary HTN by echo 07/2011   Gastric ulcer    GERD (gastroesophageal reflux disease)    Gout    Hyperlipidemia    Hypertension    Hypertensive heart disease    Neuropathy    from diabetes per patient   Osteoarthritis    Osteopenia    Shortness of breath dyspnea    with activity   Sinus pause    Spine deformity    lower spine   Type II diabetes mellitus ([place])     Past Surgical History:  Procedure Laterality Date   ABDOMINAL ADHESION SURGERY     ABDOMINAL HYSTERECTOMY     ANUS SURGERY     for torn tissue   APPENDECTOMY     BREAST BIOPSY     BREAST LUMPECTOMY     left   CHOLECYSTECTOMY  1986   COLONOSCOPY     ESOPHAGOGASTRODUODENOSCOPY (EGD) WITH PROPOFOL N/A 07/18/2013   Procedure: ESOPHAGOGASTRODUODENOSCOPY (EGD) WITH PROPOFOL;  Surgeon: Arta Silence, MD;  [facility];  Service: Endoscopy;  Laterality: N/A;   EUS N/A 07/18/2013   Procedure: ESOPHAGEAL ENDOSCOPIC ULTRASOUND (EUS) RADIAL;  Surgeon: Arta Silence, MD;  [facility];  Service: Endoscopy;  Laterality: N/A;   IR GENERIC HISTORICAL  10/30/2015   IR US GUIDE VASC ACCESS RIGHT 10/30/2015 Saverio Danker, PA-C [facility]   IR GENERIC HISTORICAL  10/30/2015   IR FLUORO GUIDE CV LINE RIGHT 10/30/2015 Saverio Danker, PA-C [facility]   LUMBAR WOUND DEBRIDEMENT N/A 10/01/2015   Procedure: LUMBAR WOUND DEBRIDEMENT;  Surgeon: Melina Schools, MD;  Location: [place];  Service: Orthopedics;  Laterality: N/A;   LUMBAR WOUND DEBRIDEMENT N/A 10/15/2015   Procedure: [facility] OUT AND CLOSURE OF BACK WOUND;  Surgeon: Melina Schools, MD;  Location: [place];  Service: Orthopedics;  Laterality: N/A;   MASS EXCISION Left 07/24/2015   Procedure: EXCISION MASS LEFT LONG FINGER;  Surgeon: Leanora Cover, MD;   Location: [place];  Service: Orthopedics;  Laterality: Left;   SPINAL FUSION N/A 09/17/2015   Procedure: FUSION POSTERIOR SPINAL MULTILEVEL L4- S1;  Surgeon: Melina Schools, MD;  Location: [place];  Service: Orthopedics;  Laterality: N/A;   TRANSFORAMINAL LUMBAR INTERBODY FUSION (TLIF) WITH PEDICLE SCREW FIXATION 1 LEVEL N/A 09/17/2015   Procedure: TRANSFORAMINAL LUMBAR INTERBODY FUSION (TLIF) WITH PEDICLE SCREW FIXATION 1 LEVEL , Lumbar 4-5;  Surgeon: Melina Schools, MD;  Location: [place];  Service: Orthopedics;  Laterality: N/A;    Current Medications: No outpatient medications have been marked as taking for the 12/07/21 encounter (Appointment) with Sueanne Margarita, MD.     Allergies:   Ketorolac tromethamine, Morphine, Morphine sulfate, and Codeine   Social History   Socioeconomic History   Marital status: Divorced    Spouse name: Not on file   Number of children: 4   Years of education: 12   Highest education level: Not on file  Occupational History   Not on file  Tobacco Use   Smoking status: Never   Smokeless tobacco: Never  Vaping Use   Vaping Use: Never used  Substance and Sexual Activity   Alcohol use: No    Comment: occasional wine   Drug use: No   Sexual activity: Not Currently  Other Topics Concern   Not on file  Social History Narrative   Patient is single, has 3 children living 1 deceased   Patient is right handed   Education level is 12   Caffeine consumption is 0   Social Determinants of Radio broadcast assistant Strain: Not on file  Food Insecurity: Not on file  Transportation Needs: Not on file  Physical Activity: Not on file  Stress: Not on file  Social Connections: Not on file     Family History: The patient's family history includes Cancer in her sister; Heart disease in her mother.  ROS:   Please see the history of present illness.    ROS  All other systems reviewed and negative.   EKGs/Labs/Other Studies Reviewed:    The  following studies were reviewed today: PAP compliance download  EKG:  EKG is not ordered today  Recent Labs: 04/28/2021: Magnesium 1.9; TSH 1.672 04/29/2021: ALT 33; BUN 35; Creatinine, Ser 1.13; Hemoglobin 11.7; Platelets 220; Potassium 3.6; Sodium 140   Recent Lipid Panel    Component Value Date/Time   CHOL 124 01/28/2021 1036   CHOL 99 (L) 12/05/2019 0813   TRIG 104.0 01/28/2021 1036   HDL 32.40 (L) 01/28/2021 1036   HDL  31 (L) 12/05/2019 0813   CHOLHDL 4 01/28/2021 1036   VLDL 20.8 01/28/2021 1036   LDLCALC 71 01/28/2021 1036   LDLCALC 45 12/05/2019 0813    Physical Exam:    VS:  There were no vitals taken for this visit.    Wt Readings from Last 3 Encounters:  10/06/21 176 lb (79.8 kg)  06/03/21 177 lb (80.3 kg)  05/28/21 179 lb (81.2 kg)    GEN: Well nourished, well developed in no acute distress HEENT: Normal NECK: No JVD; No carotid bruits LYMPHATICS: No lymphadenopathy CARDIAC:RRR, no murmurs, rubs, gallops RESPIRATORY:  Clear to auscultation without rales, wheezing or rhonchi  ABDOMEN: Soft, non-tender, non-distended MUSCULOSKELETAL:  No edema; No deformity  SKIN: Warm and dry NEUROLOGIC:  Alert and oriented x 3 PSYCHIATRIC:  Normal affect  ASSESSMENT:    1. Essential hypertension, benign   2. Pulmonary hypertension, unspecified ([place])   3. CAD in native artery   4. OSA (obstructive sleep apnea)   5. PVC's (premature ventricular contractions)   6. Hyperlipidemia LDL goal <70    PLAN:    In order of problems listed above:  1.  HTN -BP controlled on exam today -continue prescription drug management with amlodipine '5mg'$  daily, Losartan '50mg'$  daily and Lopressor '25mg'$  BID with PRN refills.  -I have personally reviewed and interpreted outside labs performed by patient's PCP which showed ***  2.  Pulmonary HTN -echo 2019 with no pulmonary HTN  - PASP 67mHg and no PHTN on echo 11/2019  3. ASCAD/Chest pain -nuclear stress test 2018 and echo 2019 were  normal -coronary CTA showed minimal CAD of the LM, mid and distal LAD -felt to have non cardiac CP -cardiac MRI due to severe [place] echo showed no HOCM and c/w hypertensive heart disease -she has chronic atypical chest pain -continue prescription drug management with ASA '81mg'$  daily, Lopressor '25mg'$  BID and statin therapy with PRN refills  4.  OSA - The patient is tolerating PAP therapy well without any problems. The PAP download performed by his DME was personally reviewed and interpreted by me today and showed an AHI of ***/hr on *** cm H2O with ***% compliance in using more than 4 hours nightly.  The patient has been using and benefiting from PAP use and will continue to benefit from therapy.    5.  PVCs  -she has not had any palpitations since I saw her last -continue Lopressor '25mg'$  BID with PRN refills  6.  HLD -LDL goal < 70 -I have personally reviewed and interpreted outside labs performed by patient's PCP which showed ***  -continue prescription drug management with Atorvastatin '10mg'$  daily with PRN refills  Followup with me in 1 year   Medication Adjustments/Labs and Tests Ordered: Current medicines are reviewed at length with the patient today.  Concerns regarding medicines are outlined above.  No orders of the defined types were placed in this encounter.  No orders of the defined types were placed in this encounter.   Signed, TFransico Him MD  12/06/2021 8:44 PM    [place]

## 2021-12-07 ENCOUNTER — Ambulatory Visit: Payer: Medicare Other | Admitting: Cardiology

## 2021-12-07 DIAGNOSIS — I2510 Atherosclerotic heart disease of native coronary artery without angina pectoris: Secondary | ICD-10-CM

## 2021-12-07 DIAGNOSIS — I10 Essential (primary) hypertension: Secondary | ICD-10-CM

## 2021-12-07 DIAGNOSIS — G4733 Obstructive sleep apnea (adult) (pediatric): Secondary | ICD-10-CM

## 2021-12-07 DIAGNOSIS — I493 Ventricular premature depolarization: Secondary | ICD-10-CM

## 2021-12-07 DIAGNOSIS — I272 Pulmonary hypertension, unspecified: Secondary | ICD-10-CM

## 2021-12-07 DIAGNOSIS — E785 Hyperlipidemia, unspecified: Secondary | ICD-10-CM

## 2022-01-07 ENCOUNTER — Ambulatory Visit: Payer: Medicare Other | Admitting: Podiatry

## 2022-01-20 ENCOUNTER — Ambulatory Visit (INDEPENDENT_AMBULATORY_CARE_PROVIDER_SITE_OTHER): Payer: 59 | Admitting: Orthopedic Surgery

## 2022-01-20 ENCOUNTER — Encounter: Payer: Self-pay | Admitting: Orthopedic Surgery

## 2022-01-20 DIAGNOSIS — M1711 Unilateral primary osteoarthritis, right knee: Secondary | ICD-10-CM

## 2022-01-20 DIAGNOSIS — M1712 Unilateral primary osteoarthritis, left knee: Secondary | ICD-10-CM | POA: Diagnosis not present

## 2022-01-20 DIAGNOSIS — M170 Bilateral primary osteoarthritis of knee: Secondary | ICD-10-CM

## 2022-01-20 MED ORDER — LIDOCAINE HCL 1 % IJ SOLN
5.0000 mL | INTRAMUSCULAR | Status: AC | PRN
  Administered 2022-01-20: 5 mL

## 2022-01-20 MED ORDER — BUPIVACAINE HCL 0.25 % IJ SOLN
4.0000 mL | INTRAMUSCULAR | Status: AC | PRN
  Administered 2022-01-20: 4 mL via INTRA_ARTICULAR

## 2022-01-20 MED ORDER — METHYLPREDNISOLONE ACETATE 40 MG/ML IJ SUSP
20.0000 mg | INTRAMUSCULAR | Status: AC | PRN
  Administered 2022-01-20: 20 mg via INTRA_ARTICULAR

## 2022-01-20 NOTE — Progress Notes (Signed)
Office Visit Note   Patient: Nancy [name]           Date of Birth: 1940/07/06           MRN: [id number] Visit Date: 01/20/2022 Requested by: No referring provider defined for this encounter. PCP: Buzzy Han, MD (Inactive)  Subjective: Chief Complaint  Patient presents with   Right Knee - Pain   Left Knee - Pain    HPI: Nancy [name] is a 82 y.o. female who presents to the office reporting bilateral knee pain.  No interval history of injury.  She is in pain management for her back.  She has had some weight loss recently.  Denies any mechanical symptoms in the knees..                ROS: All systems reviewed are negative as they relate to the chief complaint within the history of present illness.  Patient denies fevers or chills.  Assessment & Plan: Visit Diagnoses:  1. Primary osteoarthritis of both knees     Plan: Impression is bilateral knee arthritis.  Plan is bilateral knee injections today with slightly less cortisone than normal.  Continue with nonweightbearing quad strengthening exercises.  We could do these injections about 2-3 times a year if they are helpful.  Follow-Up Instructions: No follow-ups on file.   Orders:  No orders of the defined types were placed in this encounter.  No orders of the defined types were placed in this encounter.     Procedures: Large Joint Inj: bilateral knee on 01/20/2022 11:22 AM Indications: diagnostic evaluation, joint swelling and pain Details: 18 G 1.5 in needle, superolateral approach  Arthrogram: No  Medications (Right): 5 mL lidocaine 1 %; 4 mL bupivacaine 0.25 %; 20 mg methylPREDNISolone acetate 40 MG/ML Medications (Left): 5 mL lidocaine 1 %; 4 mL bupivacaine 0.25 %; 20 mg methylPREDNISolone acetate 40 MG/ML Outcome: tolerated well, no immediate complications Procedure, treatment alternatives, risks and benefits explained, specific risks discussed. Consent was given by the patient. Immediately  prior to procedure a time out was called to verify the correct patient, procedure, equipment, support staff and site/side marked as required. Patient was prepped and draped in the usual sterile fashion.       Clinical Data: No additional findings.  Objective: Vital Signs: There were no vitals taken for this visit.  Physical Exam:  Constitutional: Patient appears well-developed HEENT:  Head: Normocephalic Eyes:EOM are normal Neck: Normal range of motion Cardiovascular: Normal rate Pulmonary/chest: Effort normal Neurologic: Patient is alert Skin: Skin is warm Psychiatric: Patient has normal mood and affect  Ortho Exam: Ortho exam demonstrates palpable pedal pulses.  Both knees have a flexion contracture of about 5 degrees.  Bends past 90.  Extensor mechanism intact.  No groin pain on the right or left-hand side with internal/external rotation of the leg.  Specialty Comments:  No specialty comments available.  Imaging: No results found.   PMFS History: Patient Active Problem List   Diagnosis Date Noted   Dyslipidemia 05/28/2021   Syncope 04/28/2021   UTI (urinary tract infection) 04/28/2021   AKI (acute kidney injury) ([place]) 04/28/2021   OSA on CPAP 04/28/2021   Aortic atherosclerosis (HCC)    Type 2 diabetes mellitus with stage 3b chronic kidney disease, without long-term current use of insulin ([place]) 03/03/2020   Polyneuropathy associated with underlying disease ([place]) 03/03/2020   CAD (coronary artery disease), native coronary artery    Pulmonary hypertension, unspecified ([place]) 07/29/2017   Chest  pain 10/07/2016   SOB (shortness of breath) 10/07/2016   Pseudogout of bilateral knees 03/16/2016   Osteoarthritis 03/16/2016   SIRS (systemic inflammatory response syndrome) ([place]) 03/14/2016   Elevated sed rate 03/14/2016   Primary osteoarthritis of both knees 01/01/2016   Postoperative wound infection 12/18/2015   Difficulty in walking, not elsewhere classified    Status  post lumbar spinal fusion    Type 2 diabetes mellitus with diabetic polyneuropathy, without long-term current use of insulin ([place])    Infection due to ESBL-producing Escherichia coli    Angioedema    Hyperlipidemia 10/01/2015   Gait instability 02/19/2013   Pulmonary hyperinflation 01/17/2013   Chronic diastolic CHF (congestive heart failure) ([place])    Essential hypertension, benign 01/03/2013   Past Medical History:  Diagnosis Date   Allergy    Anemia    Aortic atherosclerosis (HCC)    Atrial tachycardia    Breast cyst    left breast   CAD (coronary artery disease), native coronary artery    minimal ASCAD to the distal LM and mid LAD with 0-24% stenosis and Calcium score of 26.   Chest pain    CKD (chronic kidney disease), stage III (HCC)    Complication of anesthesia 1986   slow to awaken after cholecystectomy   Diastolic dysfunction    with mild pulmonary HTN by echo 07/2011   Gastric ulcer    GERD (gastroesophageal reflux disease)    Gout    Hyperlipidemia    Hypertension    Hypertensive heart disease    Neuropathy    from diabetes per patient   Osteoarthritis    Osteopenia    Shortness of breath dyspnea    with activity   Sinus pause    Spine deformity    lower spine   Type II diabetes mellitus ([place])     Family History  Problem Relation Age of Onset   Heart disease Mother    Cancer Sister        ovarian    Past Surgical History:  Procedure Laterality Date   ABDOMINAL ADHESION SURGERY     ABDOMINAL HYSTERECTOMY     ANUS SURGERY     for torn tissue   APPENDECTOMY     BREAST BIOPSY     BREAST LUMPECTOMY     left   CHOLECYSTECTOMY  1986   COLONOSCOPY     ESOPHAGOGASTRODUODENOSCOPY (EGD) WITH PROPOFOL N/A 07/18/2013   Procedure: ESOPHAGOGASTRODUODENOSCOPY (EGD) WITH PROPOFOL;  Surgeon: Arta Silence, MD;  [facility];  Service: Endoscopy;  Laterality: N/A;   EUS N/A 07/18/2013   Procedure: ESOPHAGEAL ENDOSCOPIC ULTRASOUND (EUS) RADIAL;  Surgeon:  Arta Silence, MD;  [facility];  Service: Endoscopy;  Laterality: N/A;   IR GENERIC HISTORICAL  10/30/2015   IR US GUIDE VASC ACCESS RIGHT 10/30/2015 Saverio Danker, PA-C [facility]   IR GENERIC HISTORICAL  10/30/2015   IR FLUORO GUIDE CV LINE RIGHT 10/30/2015 Saverio Danker, PA-C [facility]   LUMBAR WOUND DEBRIDEMENT N/A 10/01/2015   Procedure: LUMBAR WOUND DEBRIDEMENT;  Surgeon: Melina Schools, MD;  Location: [place];  Service: Orthopedics;  Laterality: N/A;   LUMBAR WOUND DEBRIDEMENT N/A 10/15/2015   Procedure: [facility] OUT AND CLOSURE OF BACK WOUND;  Surgeon: Melina Schools, MD;  Location: [place];  Service: Orthopedics;  Laterality: N/A;   MASS EXCISION Left 07/24/2015   Procedure: EXCISION MASS LEFT LONG FINGER;  Surgeon: Leanora Cover, MD;  Location: [place];  Service: Orthopedics;  Laterality: Left;  SPINAL FUSION N/A 09/17/2015   Procedure: FUSION POSTERIOR SPINAL MULTILEVEL L4- S1;  Surgeon: Melina Schools, MD;  Location: [place];  Service: Orthopedics;  Laterality: N/A;   TRANSFORAMINAL LUMBAR INTERBODY FUSION (TLIF) WITH PEDICLE SCREW FIXATION 1 LEVEL N/A 09/17/2015   Procedure: TRANSFORAMINAL LUMBAR INTERBODY FUSION (TLIF) WITH PEDICLE SCREW FIXATION 1 LEVEL , Lumbar 4-5;  Surgeon: Melina Schools, MD;  Location: [place];  Service: Orthopedics;  Laterality: N/A;   Social History   Occupational History   Not on file  Tobacco Use   Smoking status: Never   Smokeless tobacco: Never  Vaping Use   Vaping Use: Never used  Substance and Sexual Activity   Alcohol use: No    Comment: occasional wine   Drug use: No   Sexual activity: Not Currently

## 2022-02-03 ENCOUNTER — Telehealth: Payer: Self-pay

## 2022-02-03 NOTE — Telephone Encounter (Signed)
Patient went to [facility] yesterday and they performed a urinalysis and patient was told that her sugar level was high. Would like to know if she needs to adjust anything.

## 2022-02-04 NOTE — Telephone Encounter (Signed)
Patient scheduled to come in on Monday 02/08/22

## 2022-02-08 ENCOUNTER — Ambulatory Visit (INDEPENDENT_AMBULATORY_CARE_PROVIDER_SITE_OTHER): Payer: 59 | Admitting: Internal Medicine

## 2022-02-08 ENCOUNTER — Encounter: Payer: Self-pay | Admitting: Internal Medicine

## 2022-02-08 VITALS — BP 122/80 | HR 81 | Ht 62.0 in | Wt 179.0 lb

## 2022-02-08 DIAGNOSIS — E1122 Type 2 diabetes mellitus with diabetic chronic kidney disease: Secondary | ICD-10-CM | POA: Diagnosis not present

## 2022-02-08 DIAGNOSIS — E1142 Type 2 diabetes mellitus with diabetic polyneuropathy: Secondary | ICD-10-CM | POA: Diagnosis not present

## 2022-02-08 DIAGNOSIS — N1832 Chronic kidney disease, stage 3b: Secondary | ICD-10-CM

## 2022-02-08 LAB — POCT GLYCOSYLATED HEMOGLOBIN (HGB A1C): Hemoglobin A1C: 6.3 % — AB (ref 4.0–5.6)

## 2022-02-08 LAB — POCT GLUCOSE (DEVICE FOR HOME USE): POC Glucose: 113 mg/dl — AB (ref 70–99)

## 2022-02-08 MED ORDER — RYBELSUS 7 MG PO TABS: 7.0000 mg | ORAL_TABLET | Freq: Every day | ORAL | 3 refills | Status: DC

## 2022-02-08 MED ORDER — METFORMIN HCL 1000 MG PO TABS: 1000.0000 mg | ORAL_TABLET | Freq: Every day | ORAL | 3 refills | Status: DC

## 2022-02-08 MED ORDER — DAPAGLIFLOZIN PROPANEDIOL 10 MG PO TABS: 10.0000 mg | ORAL_TABLET | Freq: Every day | ORAL | 3 refills | Status: DC

## 2022-02-08 NOTE — Progress Notes (Signed)
Name: Nancy [name]  Age/ Sex: 82 y.o., female   MRN/ DOB: [id number], 1940/12/28     PCP: Buzzy Han, MD (Inactive)   Reason for Endocrinology Evaluation: Type 2 Diabetes Mellitus  Initial Endocrine Consultative Visit: 10/31/2017    PATIENT IDENTIFIER: Nancy [name] is a 82 y.o. female with a past medical history of HTN, Gout, Hyperlipidemia, and T2 DM . The patient has followed with Endocrinology clinic since 10/31/17 for consultative assistance with management of her diabetes.  DIABETIC HISTORY:  Nancy [name] was diagnosed with T2DM ~ in 2009, she has been on oral glycemic agents since her diagnosis. Has not been on insulin prior to her presentation here. Her hemoglobin A1c has ranged from 7.3% in 09/2015, peaking at 8.6% in 08/2015.  Glipizide stopped 06/2019 with an A1c of 6.0 %  Started Farxiga 09/24/2020 Started Rybelsus May 2023 SUBJECTIVE:   During the last visit (10/06/2021): A1c 6.3%     Today (02/08/2022): Nancy [name] is here for a  follow up on diabetes management. She has been checking glucose occasionally.    She continues to follow up with the pain clinic 01/22/2022 She was seen by podiatry 10/05/2021 She follows with  cardiology  for nonobstructive CAD  She is on Losartan through nephrology   Denies diarrhea but had an episode of  nausea and vomiting ~ 2 weeks ago  , S/P EGD Dx with GERD and was given Abx  and antacid  She has imbalance issues, but no falls   HOME DIABETES REGIMEN:  Metfomrin 1000 mg daily  Rybelsus 7 mg daily Farxiga 10 mg daily    METER DOWNLOAD SUMMARY:did not bring     DIABETIC COMPLICATIONS: Microvascular complications:  Neuropathy Denies: retinopathy, nephropathy Last eye exam: Completed 08/2020 scheduled 02/2022     Macrovascular complications:   CAD(nonobstructive CAD) Denies: PVD, CVA    HISTORY:  Past Medical History:  Past Medical History:  Diagnosis Date   Allergy    Anemia    Aortic  atherosclerosis (HCC)    Atrial tachycardia    Breast cyst    left breast   CAD (coronary artery disease), native coronary artery    minimal ASCAD to the distal LM and mid LAD with 0-24% stenosis and Calcium score of 26.   Chest pain    CKD (chronic kidney disease), stage III (HCC)    Complication of anesthesia 1986   slow to awaken after cholecystectomy   Diastolic dysfunction    with mild pulmonary HTN by echo 07/2011   Gastric ulcer    GERD (gastroesophageal reflux disease)    Gout    Hyperlipidemia    Hypertension    Hypertensive heart disease    Neuropathy    from diabetes per patient   Osteoarthritis    Osteopenia    Shortness of breath dyspnea    with activity   Sinus pause    Spine deformity    lower spine   Type II diabetes mellitus ([place])    Past Surgical History:  Past Surgical History:  Procedure Laterality Date   ABDOMINAL ADHESION SURGERY     ABDOMINAL HYSTERECTOMY     ANUS SURGERY     for torn tissue   APPENDECTOMY     BREAST BIOPSY     BREAST LUMPECTOMY     left   CHOLECYSTECTOMY  1986   COLONOSCOPY     ESOPHAGOGASTRODUODENOSCOPY (EGD) WITH PROPOFOL N/A 07/18/2013   Procedure: ESOPHAGOGASTRODUODENOSCOPY (EGD) WITH PROPOFOL;  Surgeon: Gwyndolyn Saxon  Paulita Fujita, MD;  Location: [facility];  Service: Endoscopy;  Laterality: N/A;   EUS N/A 07/18/2013   Procedure: ESOPHAGEAL ENDOSCOPIC ULTRASOUND (EUS) RADIAL;  Surgeon: Arta Silence, MD;  Location: [facility];  Service: Endoscopy;  Laterality: N/A;   IR GENERIC HISTORICAL  10/30/2015   IR US GUIDE VASC ACCESS RIGHT 10/30/2015 Saverio Danker, PA-C [facility]   IR GENERIC HISTORICAL  10/30/2015   IR FLUORO GUIDE CV LINE RIGHT 10/30/2015 Saverio Danker, PA-C [facility]   LUMBAR WOUND DEBRIDEMENT N/A 10/01/2015   Procedure: LUMBAR WOUND DEBRIDEMENT;  Surgeon: Melina Schools, MD;  Location: [place];  Service: Orthopedics;  Laterality: N/A;   LUMBAR WOUND DEBRIDEMENT N/A 10/15/2015   Procedure: [facility] OUT AND CLOSURE OF  BACK WOUND;  Surgeon: Melina Schools, MD;  Location: [place];  Service: Orthopedics;  Laterality: N/A;   MASS EXCISION Left 07/24/2015   Procedure: EXCISION MASS LEFT LONG FINGER;  Surgeon: Leanora Cover, MD;  Location: [place];  Service: Orthopedics;  Laterality: Left;   SPINAL FUSION N/A 09/17/2015   Procedure: FUSION POSTERIOR SPINAL MULTILEVEL L4- S1;  Surgeon: Melina Schools, MD;  Location: [place];  Service: Orthopedics;  Laterality: N/A;   TRANSFORAMINAL LUMBAR INTERBODY FUSION (TLIF) WITH PEDICLE SCREW FIXATION 1 LEVEL N/A 09/17/2015   Procedure: TRANSFORAMINAL LUMBAR INTERBODY FUSION (TLIF) WITH PEDICLE SCREW FIXATION 1 LEVEL , Lumbar 4-5;  Surgeon: Melina Schools, MD;  Location: [place];  Service: Orthopedics;  Laterality: N/A;   Social History:  reports that she has never smoked. She has never used smokeless tobacco. She reports that she does not drink alcohol and does not use drugs. Family History:  Family History  Problem Relation Age of Onset   Heart disease Mother    Cancer Sister        ovarian     HOME MEDICATIONS: Allergies as of 02/08/2022       Reactions   Ketorolac Tromethamine Swelling   10/02/15 - given Toradol + morphine and RN noted tongue swelling - given Epi + Benadryl Has taken ibuprofen in past w/o problem Other reaction(s): Unknown   Morphine Swelling   Face and tongue swelling   Morphine Sulfate    Other reaction(s): Unknown   Codeine Other (See Comments)   headache Other reaction(s): Unknown        Medication List        Accurate as of February 08, 2022  1:21 PM. If you have any questions, ask your nurse or doctor.          STOP taking these medications    tiZANidine 4 MG tablet Commonly known as: ZANAFLEX Stopped by: Dorita Sciara, MD       TAKE these medications    allopurinol 300 MG tablet Commonly known as: ZYLOPRIM Take 300 mg by mouth daily.   amLODipine 5 MG tablet Commonly known as: NORVASC Take 5 mg by  mouth daily.   aspirin EC 81 MG tablet Take 81 mg by mouth daily.   atorvastatin 10 MG tablet Commonly known as: LIPITOR Take 1 tablet by mouth once daily   CareSens N Glucose Test test strip Generic drug: glucose blood 1 each by Other route daily in the afternoon. Use as instructed   colchicine 0.6 MG tablet Take 0.6 mg by mouth daily.   dapagliflozin propanediol 10 MG Tabs tablet Commonly known as: Farxiga Take 1 tablet (10 mg total) by mouth daily before breakfast.   Durezol 0.05 % Emul Generic drug: Difluprednate Apply 1-2  drops to eye as directed. For dry eyes   famotidine 20 MG tablet Commonly known as: PEPCID Take 20 mg by mouth 2 (two) times daily.   furosemide 20 MG tablet Commonly known as: LASIX Take 1 tablet (20 mg total) by mouth daily as needed (for weight gain of >3lbs in 1 day or >5lbs in 1 week).   gabapentin 300 MG capsule Commonly known as: NEURONTIN Take 600 mg by mouth 3 (three) times daily.   losartan 25 MG tablet Commonly known as: COZAAR Take 25 mg by mouth at bedtime.   metFORMIN 1000 MG tablet Commonly known as: GLUCOPHAGE Take 1 tablet (1,000 mg total) by mouth daily with breakfast.   metoprolol tartrate 50 MG tablet Commonly known as: LOPRESSOR Take 0.5 tablets (25 mg total) by mouth 2 (two) times daily.   Multiple Vitamins tablet Take 1 tablet by mouth daily.   omeprazole 20 MG capsule Commonly known as: PRILOSEC Take 20 mg by mouth daily as needed (acid reflux).   OneTouch Verio IQ System w/Device Kit 1 kit by Does not apply route 2 (two) times daily. E11.65   Rybelsus 7 MG Tabs Generic drug: Semaglutide Take 7 mg by mouth daily.   traMADol 50 MG tablet Commonly known as: ULTRAM Take 50 mg by mouth every 6 (six) hours as needed for moderate pain or severe pain.         OBJECTIVE:   Vital Signs: BP 122/80 (BP Location: Left Arm, Patient Position: Sitting, Cuff Size: Large)   Pulse 81   Ht '5\' 2"'$  (1.575 m)   Wt 179  lb (81.2 kg)   SpO2 94%   BMI 32.74 kg/m   Wt Readings from Last 3 Encounters:  02/08/22 179 lb (81.2 kg)  10/06/21 176 lb (79.8 kg)  06/03/21 177 lb (80.3 kg)     Exam: General: Pt appears well and is in NAD  Lungs: Clear with good BS bilat with no rales, rhonchi, or wheezes  Heart: RRR   Extremities: No  pretibial edema.   Neuro: MS is good with appropriate affect, pt is alert and Ox3    DM foot exam: per podiatry 10/05/2021  The skin of the feet is intact without sores or ulcerations. The pedal pulses are 2+ on right and 2+ on left. The sensation is decreased  to a screening 5.07, 10 gram monofilament at the left great toe         DATA REVIEWED:   Latest Reference Range & Units 04/29/21 06:30  Sodium 135 - 145 mmol/L 140  Potassium 3.5 - 5.1 mmol/L 3.6  Chloride 98 - 111 mmol/L 107  CO2 22 - 32 mmol/L 24  Glucose 70 - 99 mg/dL 106 (H)  BUN 8 - 23 mg/dL 35 (H)  Creatinine 0.44 - 1.00 mg/dL 1.13 (H)  Calcium 8.9 - 10.3 mg/dL 9.2  Anion gap 5 - 15  9  Alkaline Phosphatase 38 - 126 U/L 77  Albumin 3.5 - 5.0 g/dL 3.2 (L)  AST 15 - 41 U/L 39  ALT 0 - 44 U/L 33  Total Protein 6.5 - 8.1 g/dL 7.1  Total Bilirubin 0.3 - 1.2 mg/dL 0.9  GFR, Estimated >60 mL/min 49 (L)      Latest Reference Range & Units 01/28/21 10:36  Creatinine,U mg/dL 125.8  Microalb, Ur 0.0 - 1.9 mg/dL 2.8 (H)  MICROALB/CREAT RATIO 0.0 - 30.0 mg/g 2.2   ASSESSMENT / PLAN / RECOMMENDATIONS:   1) Type 2 Diabetes Mellitus, OPtimally controlled,  With Neuropathic and macrovascular complications - Most recent A1c of 6.3  %. Goal A1c < 7.0 %.    - Praised the pt on improved glycemic control  - We had reduced her metformin by 50% in the past due to GFR less than 45 - She had an episode of vomiting ~ 2 weeks ago, we discussed the one of the side effects of Rybelsus is vomiting, unclear if this is related at this time but since this was one episode 2 weeks ago, we opted to monitor      MEDICATIONS: -Continue Rybelsus  7 mg daily  -Continue metformin 1000 mg ONCE daily  -Continue  Farxiga 10 mg daily   EDUCATION / INSTRUCTIONS: BG monitoring instructions: Patient is instructed to check her blood sugars 1 times a day, fasting  Call [facility] if: BG persistently < 70  I reviewed the Rule of 15 for the treatment of hypoglycemia in detail with the patient. Literature supplied.   2) Diabetic complications:  Eye: Does not have known diabetic retinopathy.  Neuro/ Feet: Does  have known diabetic peripheral neuropathy. Renal: Patient does  have known baseline CKD. She is on an ACEI/ARB at present. Normal Ma/Cr ratio       F/U in 6 months   Signed electronically by: Mack Guise, MD  [facility]  [facility] [address] [place], [place] [address] Phone: [phone] FAX: [phone]   CC:  Buzzy Han, MD (Inactive) No address on file Phone: None  Fax: None  Return to Endocrinology clinic as below: Future Appointments  Date Time Provider [place]  02/16/2022  2:15 PM Trula Slade, DPM [facility]  03/10/2022  3:00 PM Sueanne Margarita, MD [facility]

## 2022-02-08 NOTE — Patient Instructions (Signed)
-   Continue Rybelsus 7 mg daily  - Continue  Metformin 1000 mg one tablet day  - Continue  Farxiga 10  mg, 1 tablet daliy       - HOW TO TREAT LOW BLOOD SUGARS (Blood sugar LESS THAN 70 MG/DL) Please follow the RULE OF 15 for the treatment of hypoglycemia treatment (when your (blood sugars are less than 70 mg/dL)   STEP 1: Take 15 grams of carbohydrates when your blood sugar is low, which includes:  3-4 GLUCOSE TABS  OR 3-4 OZ OF JUICE OR REGULAR SODA OR ONE TUBE OF GLUCOSE GEL    STEP 2: RECHECK blood sugar in 15 MINUTES STEP 3: If your blood sugar is still low at the 15 minute recheck --> then, go back to STEP 1 and treat AGAIN with another 15 grams of carbohydrates.

## 2022-02-16 ENCOUNTER — Ambulatory Visit (INDEPENDENT_AMBULATORY_CARE_PROVIDER_SITE_OTHER): Payer: 59 | Admitting: Podiatry

## 2022-02-16 DIAGNOSIS — M79674 Pain in right toe(s): Secondary | ICD-10-CM

## 2022-02-16 DIAGNOSIS — M79675 Pain in left toe(s): Secondary | ICD-10-CM | POA: Diagnosis not present

## 2022-02-16 DIAGNOSIS — B351 Tinea unguium: Secondary | ICD-10-CM

## 2022-02-16 DIAGNOSIS — E1149 Type 2 diabetes mellitus with other diabetic neurological complication: Secondary | ICD-10-CM | POA: Diagnosis not present

## 2022-02-16 DIAGNOSIS — M722 Plantar fascial fibromatosis: Secondary | ICD-10-CM

## 2022-02-16 NOTE — Progress Notes (Signed)
Subjective: Chief Complaint  Patient presents with   foot care     DFC, Callouses, bilateral feet     82 year old female presents with above concerns.  Her nails are causing discomfort as well as the calluses.  No open lesions.  She has been getting pain to the bottom of her feet as well.  No injuries that she reports.  She is experiencing neuropathy symptoms.  She does not take gabapentin as prescribed, but at least twice a day.   Objective: AAO x3, NAD DP/PT pulses palpable bilaterally, CRT less than 3 seconds Sensation decreased with Semmes Weinstein monofilament Nails are hypertrophic, dystrophic, brittle, discolored, elongated 10. No surrounding redness or drainage. Tenderness nails 1-5 bilaterally. No open lesions or pre-ulcerative lesions are identified today. Tenderness palpation on the plantar aspect the calcaneus and just distal to this along the plantar fascia.  No edema, erythema.  Equinus is present.  There is no area pinpoint tenderness.  Flexor, extensor tendons appear to be intact.  MMT 5/5. No pain with calf compression, swelling, warmth, erythema  Assessment: 82 year old female with symptomatic onychomycosis, neuropathy, Plantar fasciitis  Plan: -All treatment options discussed with the patient including all alternatives, risks, complications.  -Will order compound cream through [facility] to help with neuropathy pain. -Regards to her foot pain we discussed stretching, icing regularly.  I offered a steroid injection today but she wanted to hold off on that for now.  -Sharply debrided the nails x 10 without any complications or bleeding -Patient encouraged to call the office with any questions, concerns, change in symptoms.   Trula Slade DPM

## 2022-03-09 ENCOUNTER — Ambulatory Visit: Payer: 59 | Attending: Cardiology | Admitting: Cardiology

## 2022-03-09 ENCOUNTER — Encounter: Payer: Self-pay | Admitting: Cardiology

## 2022-03-09 VITALS — BP 140/80 | HR 79 | Ht 61.0 in | Wt 180.0 lb

## 2022-03-09 DIAGNOSIS — G4733 Obstructive sleep apnea (adult) (pediatric): Secondary | ICD-10-CM

## 2022-03-09 DIAGNOSIS — I2510 Atherosclerotic heart disease of native coronary artery without angina pectoris: Secondary | ICD-10-CM

## 2022-03-09 DIAGNOSIS — I119 Hypertensive heart disease without heart failure: Secondary | ICD-10-CM | POA: Diagnosis not present

## 2022-03-09 DIAGNOSIS — I272 Pulmonary hypertension, unspecified: Secondary | ICD-10-CM | POA: Diagnosis not present

## 2022-03-09 DIAGNOSIS — E785 Hyperlipidemia, unspecified: Secondary | ICD-10-CM

## 2022-03-09 DIAGNOSIS — I493 Ventricular premature depolarization: Secondary | ICD-10-CM

## 2022-03-09 NOTE — Patient Instructions (Signed)
Medication Instructions:  Your physician recommends that you continue on your current medications as directed. Please refer to the Current Medication list given to you today.  *If you need a refill on your cardiac medications before your next appointment, please call your pharmacy*   Lab Work: Lipids, ALT If you have labs (blood work) drawn today and your tests are completely normal, you will receive your results only by: [place] (if you have MyChart) OR A paper copy in the mail If you have any lab test that is abnormal or we need to change your treatment, we will call you to review the results.   Testing/Procedures: Your physician has requested that you have an echocardiogram. Echocardiography is a painless test that uses sound waves to create images of your heart. It provides your doctor with information about the size and shape of your heart and how well your heart's chambers and valves are working. This procedure takes approximately one hour. There are no restrictions for this procedure. Please do NOT wear cologne, perfume, aftershave, or lotions (deodorant is allowed). Please arrive 15 minutes prior to your appointment time.    Follow-Up: At [facility], you and your health needs are our priority.  As part of our continuing mission to provide you with exceptional heart care, we have created designated Provider Care Teams.  These Care Teams include your primary Cardiologist (physician) and Advanced Practice Providers (APPs -  Physician Assistants and Nurse Practitioners) who all work together to provide you with the care you need, when you need it.   Your next appointment:   3 month(s)  Provider:   Fransico Him, MD

## 2022-03-09 NOTE — Progress Notes (Signed)
Cardiology Office Note:    Date:  03/09/2022   ID:  Nancy [name], DOB 1940/01/23, MRN [id number]  PCP:  Cipriano Mile, NP  Cardiologist:  Fransico Him, MD    Referring MD: No ref. provider found   Chief Complaint  Patient presents with   Coronary Artery Disease   Hypertension   Hyperlipidemia   Sleep Apnea    History of Present Illness:    Nancy [name] is a 82 y.o. female with a hx of HTN and diastolic dysfunction .  She also has a history of mild pulmonary hypertension by 2D echocardiogram in July 2013.  She had a Lexiscan Myoview for chest pain 10/13/2016 which showed no inducible ischemia. 2D echo in 2019 showed  normal LVF with EF 60-65% with moderate LVH and elevated filling pressures. She also has a hx of OSA and is on PAP therapy.   She also has a history of atypical CP and DOE and coronary CTA was done which showed minimal CAD of the LM, mid and distal LAD and her CP was felt to be non cardiac in origin. 2D echo was normal with severe ASSH, no PHTN and cardiac MRI was done showing normal LVF with EF 73%, moderate BSH, normal RV and no evidence of cardiac amyloid or HOCM and findings c/w hypertensive heart disease.   She is here today for followup and is doing well.  She denies any chest pain or pressure, SOB, DOE, PND, orthopnea, LE edema, dizziness (except when feeling off balance), palpitations or syncope. SHe is compliant with her meds and is tolerating meds with no SE.    She has been able to use her sleep device recently because she is having back problems and has been sleeping in her recliner.She has not taken her device out to put by her recliner.  She also was changed from [facility] and has not heard from new DME.  She has not changed out the cushion on her mask for a long time.  Past Medical History:  Diagnosis Date   Allergy    Anemia    Aortic atherosclerosis (HCC)    Atrial tachycardia    Breast cyst    left breast   CAD (coronary artery disease),  native coronary artery    minimal ASCAD to the distal LM and mid LAD with 0-24% stenosis and Calcium score of 26.   Chest pain    CKD (chronic kidney disease), stage III (HCC)    Complication of anesthesia 1986   slow to awaken after cholecystectomy   Diastolic dysfunction    with mild pulmonary HTN by echo 07/2011   Gastric ulcer    GERD (gastroesophageal reflux disease)    Gout    Hyperlipidemia    Hypertension    Hypertensive heart disease    Neuropathy    from diabetes per patient   Osteoarthritis    Osteopenia    Shortness of breath dyspnea    with activity   Sinus pause    Spine deformity    lower spine   Type II diabetes mellitus ([place])     Past Surgical History:  Procedure Laterality Date   ABDOMINAL ADHESION SURGERY     ABDOMINAL HYSTERECTOMY     ANUS SURGERY     for torn tissue   APPENDECTOMY     BREAST BIOPSY     BREAST LUMPECTOMY     left   CHOLECYSTECTOMY  1986   COLONOSCOPY     ESOPHAGOGASTRODUODENOSCOPY (EGD) WITH  PROPOFOL N/A 07/18/2013   Procedure: ESOPHAGOGASTRODUODENOSCOPY (EGD) WITH PROPOFOL;  Surgeon: Arta Silence, MD;  Location: [facility];  Service: Endoscopy;  Laterality: N/A;   EUS N/A 07/18/2013   Procedure: ESOPHAGEAL ENDOSCOPIC ULTRASOUND (EUS) RADIAL;  Surgeon: Arta Silence, MD;  Location: [facility];  Service: Endoscopy;  Laterality: N/A;   IR GENERIC HISTORICAL  10/30/2015   IR US GUIDE VASC ACCESS RIGHT 10/30/2015 Saverio Danker, PA-C [facility]   IR GENERIC HISTORICAL  10/30/2015   IR FLUORO GUIDE CV LINE RIGHT 10/30/2015 Saverio Danker, PA-C [facility]   LUMBAR WOUND DEBRIDEMENT N/A 10/01/2015   Procedure: LUMBAR WOUND DEBRIDEMENT;  Surgeon: Melina Schools, MD;  Location: [place];  Service: Orthopedics;  Laterality: N/A;   LUMBAR WOUND DEBRIDEMENT N/A 10/15/2015   Procedure: [facility] OUT AND CLOSURE OF BACK WOUND;  Surgeon: Melina Schools, MD;  Location: [place];  Service: Orthopedics;  Laterality: N/A;   MASS EXCISION Left 07/24/2015    Procedure: EXCISION MASS LEFT LONG FINGER;  Surgeon: Leanora Cover, MD;  Location: [place];  Service: Orthopedics;  Laterality: Left;   SPINAL FUSION N/A 09/17/2015   Procedure: FUSION POSTERIOR SPINAL MULTILEVEL L4- S1;  Surgeon: Melina Schools, MD;  Location: [place];  Service: Orthopedics;  Laterality: N/A;   TRANSFORAMINAL LUMBAR INTERBODY FUSION (TLIF) WITH PEDICLE SCREW FIXATION 1 LEVEL N/A 09/17/2015   Procedure: TRANSFORAMINAL LUMBAR INTERBODY FUSION (TLIF) WITH PEDICLE SCREW FIXATION 1 LEVEL , Lumbar 4-5;  Surgeon: Melina Schools, MD;  Location: [place];  Service: Orthopedics;  Laterality: N/A;    Current Medications: Current Meds  Medication Sig   allopurinol (ZYLOPRIM) 300 MG tablet Take 300 mg by mouth daily.    amLODipine (NORVASC) 5 MG tablet Take 5 mg by mouth daily.   aspirin EC 81 MG tablet Take 81 mg by mouth daily.   atorvastatin (LIPITOR) 10 MG tablet Take 1 tablet by mouth once daily   Blood Glucose Monitoring Suppl (ONETOUCH VERIO IQ SYSTEM) w/Device KIT 1 kit by Does not apply route 2 (two) times daily. E11.65   colchicine 0.6 MG tablet Take 0.6 mg by mouth daily.   dapagliflozin propanediol (FARXIGA) 10 MG TABS tablet Take 1 tablet (10 mg total) by mouth daily before breakfast.   famotidine (PEPCID) 20 MG tablet Take 20 mg by mouth 2 (two) times daily.   furosemide (LASIX) 20 MG tablet Take 1 tablet (20 mg total) by mouth daily as needed (for weight gain of >3lbs in 1 day or >5lbs in 1 week).   gabapentin (NEURONTIN) 300 MG capsule Take 600 mg by mouth 3 (three) times daily.   glucose blood (CARESENS N GLUCOSE TEST) test strip 1 each by Other route daily in the afternoon. Use as instructed   losartan (COZAAR) 25 MG tablet Take 25 mg by mouth at bedtime.   metFORMIN (GLUCOPHAGE) 1000 MG tablet Take 1 tablet (1,000 mg total) by mouth daily with breakfast.   Multiple Vitamins tablet Take 1 tablet by mouth daily.   omeprazole (PRILOSEC) 20 MG capsule Take 20 mg by  mouth daily as needed (acid reflux).   Semaglutide (RYBELSUS) 7 MG TABS Take 1 tablet (7 mg total) by mouth daily.   traMADol (ULTRAM) 50 MG tablet Take 50 mg by mouth every 6 (six) hours as needed for moderate pain or severe pain.   [DISCONTINUED] DUREZOL 0.05 % EMUL Apply 1-2 drops to eye as directed. For dry eyes     Allergies:   Ketorolac tromethamine, Morphine, Morphine sulfate, and  Codeine   Social History   Socioeconomic History   Marital status: Divorced    Spouse name: Not on file   Number of children: 4   Years of education: 12   Highest education level: Not on file  Occupational History   Not on file  Tobacco Use   Smoking status: Never   Smokeless tobacco: Never  Vaping Use   Vaping Use: Never used  Substance and Sexual Activity   Alcohol use: No    Comment: occasional wine   Drug use: No   Sexual activity: Not Currently  Other Topics Concern   Not on file  Social History Narrative   Patient is single, has 3 children living 1 deceased   Patient is right handed   Education level is 12   Caffeine consumption is 0   Social Determinants of Radio broadcast assistant Strain: Not on file  Food Insecurity: Not on file  Transportation Needs: Not on file  Physical Activity: Not on file  Stress: Not on file  Social Connections: Not on file     Family History: The patient's family history includes Cancer in her sister; Heart disease in her mother.  ROS:   Please see the history of present illness.    ROS  All other systems reviewed and negative.   EKGs/Labs/Other Studies Reviewed:    The following studies were reviewed today: PAP compliance download  EKG:  EKG is not ordered today  Recent Labs: 04/28/2021: Magnesium 1.9; TSH 1.672 04/29/2021: ALT 33; BUN 35; Creatinine, Ser 1.13; Hemoglobin 11.7; Platelets 220; Potassium 3.6; Sodium 140   Recent Lipid Panel    Component Value Date/Time   CHOL 124 01/28/2021 1036   CHOL 99 (L) 12/05/2019 0813    TRIG 104.0 01/28/2021 1036   HDL 32.40 (L) 01/28/2021 1036   HDL 31 (L) 12/05/2019 0813   CHOLHDL 4 01/28/2021 1036   VLDL 20.8 01/28/2021 1036   LDLCALC 71 01/28/2021 1036   LDLCALC 45 12/05/2019 0813    Physical Exam:    VS:  BP (!) 140/80   Pulse 79   Ht '5\' 1"'$  (1.549 m)   Wt 180 lb (81.6 kg)   SpO2 94%   BMI 34.01 kg/m     Wt Readings from Last 3 Encounters:  03/09/22 180 lb (81.6 kg)  02/08/22 179 lb (81.2 kg)  10/06/21 176 lb (79.8 kg)    GEN: Well nourished, well developed in no acute distress HEENT: Normal NECK: No JVD; No carotid bruits LYMPHATICS: No lymphadenopathy CARDIAC:RRR, no murmurs, rubs, gallops RESPIRATORY:  Clear to auscultation without rales, wheezing or rhonchi  ABDOMEN: Soft, non-tender, non-distended MUSCULOSKELETAL:  No edema; No deformity  SKIN: Warm and dry NEUROLOGIC:  Alert and oriented x 3 PSYCHIATRIC:  Normal affect  ASSESSMENT:    1. Hypertensive heart disease without heart failure   2. Pulmonary hypertension, unspecified ([place])   3. CAD in native artery   4. OSA (obstructive sleep apnea)   5. PVC's (premature ventricular contractions)   6. Hyperlipidemia LDL goal <70    PLAN:    In order of problems listed above:  1.  HTN -BP is controlled on exam today -Continue prescription drug management with amlodipine 5 mg daily, losartan 25 mg daily and Lopressor 25 mg twice daily with as needed refills -I have personally reviewed and interpreted outside labs performed by patient's PCP which showed serum creatinine 1.04 on 10/08/2021  2.  Pulmonary HTN -echo 2019 with no pulmonary HTN  -  PASP 82mHg and no PHTN on echo 11/2019 -I will repeat 2D echo to reassess PA pressures  3. ASCAD/Chest pain -nuclear stress test 2018 and echo 2019 were normal -coronary CTA showed minimal CAD of the LM, mid and distal LAD -felt to have non cardiac CP -cardiac MRI due to severe [place] echo showed no HOCM and c/w hypertensive heart disease -She denies  any anginal chest pain -Continue prescription drug managed with aspirin 81 mg daily, Lopressor 25 mg twice daily and atorvastatin 10 mg daily with as needed refills  4. OSA - The PAP download performed by his DME was personally reviewed and interpreted by me today and showed an AHI of 12/hr on 12 cm H2O with 40% compliance in using more than 4 hours nightly.  -I encouraged her to be more compliant with her device and move it out into the room where she is sleeping in  her recliner -her AHI is elevated but likely related to not changing out her cushion in a long time - I encouraged her to change out her cushion every 4 weeks. I will get her set up with a new DME and get supplies   5.  PVCs  -denies any palpitations -Continue Lopressor 25 mg twice daily  6.  HLD -LDL goal < 70 -I have personally reviewed and interpreted outside labs performed by patient's PCP which showed LDL 71 and HDL 32 on 01/28/2021 -Repeat FLP and ALT -Continue drug management with atorvastatin 10 mg daily with as needed refills  Followup with me in 3 months for her sleep   Medication Adjustments/Labs and Tests Ordered: Current medicines are reviewed at length with the patient today.  Concerns regarding medicines are outlined above.  No orders of the defined types were placed in this encounter.  No orders of the defined types were placed in this encounter.   Signed, TFransico Him MD  03/09/2022 3:04 PM    [place]

## 2022-03-09 NOTE — Telephone Encounter (Signed)
DME has changed to [place]  DME selection is [facility] Patient understands he will be contacted by [facility] to set up his cpap. Patient understands to call if [facility] does not contact him with new setup in a timely manner. Patient understands they will be called once confirmation has been received from [facility] that they have received their new machine to schedule 10 week follow up appointment. [place] notified of new cpap order  Please add to airview Patient was grateful for the call and thanked me

## 2022-03-09 NOTE — Addendum Note (Signed)
Addended by: Freada Bergeron on: 03/09/2022 03:30 PM   Modules accepted: Orders

## 2022-03-10 ENCOUNTER — Ambulatory Visit: Payer: Medicare Other | Admitting: Cardiology

## 2022-03-10 LAB — LIPID PANEL
Chol/HDL Ratio: 2.7 ratio (ref 0.0–4.4)
Cholesterol, Total: 125 mg/dL (ref 100–199)
HDL: 46 mg/dL (ref >39)
LDL Chol Calc (NIH): 60 mg/dL (ref 0–99)
Triglycerides: 99 mg/dL (ref 0–149)
VLDL Cholesterol Cal: 19 mg/dL (ref 5–40)

## 2022-03-10 LAB — ALT: ALT: 42 IU/L — ABNORMAL HIGH (ref 0–32)

## 2022-03-12 ENCOUNTER — Telehealth: Payer: Self-pay

## 2022-03-12 DIAGNOSIS — E7800 Pure hypercholesterolemia, unspecified: Secondary | ICD-10-CM

## 2022-03-12 NOTE — Telephone Encounter (Signed)
Results reviewed with patient, who verbalizes understanding of elevated ALT and agrees to repeat labs in 8 weeks. Orders placed, labs scheduled.

## 2022-03-12 NOTE — Telephone Encounter (Signed)
-----   Message from Nuala Alpha, LPN sent at [id number]  8:16 AM EST -----  ----- Message ----- From: Freada Bergeron, MD Sent: 03/10/2022   6:52 PM EST To: [facility] Triage  Her cholesterol looks great. ALT is mildly elevated when compared to prior. Will continue current meds and recheck ALT in 8 weeks to make sure stable and not increasing. Would just ensure no significant alcohol or tylenol use.

## 2022-04-07 ENCOUNTER — Ambulatory Visit: Payer: Medicare Other | Admitting: Internal Medicine

## 2022-04-08 ENCOUNTER — Ambulatory Visit (HOSPITAL_COMMUNITY): Payer: 59 | Attending: Cardiology

## 2022-04-08 DIAGNOSIS — I2510 Atherosclerotic heart disease of native coronary artery without angina pectoris: Secondary | ICD-10-CM | POA: Diagnosis not present

## 2022-04-08 DIAGNOSIS — I272 Pulmonary hypertension, unspecified: Secondary | ICD-10-CM | POA: Insufficient documentation

## 2022-04-08 DIAGNOSIS — I119 Hypertensive heart disease without heart failure: Secondary | ICD-10-CM | POA: Insufficient documentation

## 2022-04-08 DIAGNOSIS — G4733 Obstructive sleep apnea (adult) (pediatric): Secondary | ICD-10-CM | POA: Insufficient documentation

## 2022-04-08 LAB — ECHOCARDIOGRAM COMPLETE
Area-P 1/2: 3.99 cm2
S' Lateral: 1.5 cm

## 2022-04-13 ENCOUNTER — Telehealth: Payer: Self-pay

## 2022-04-13 NOTE — Telephone Encounter (Signed)
Reviewed echo results with patient who verbalized understanding of normal heart function with moderately thickened heart muscle and increased stiffness of heart muscle probably related to age. Also reviewed septal hypertrophy stable from 2021 and no further work up needed at this time.

## 2022-04-13 NOTE — Telephone Encounter (Signed)
-----   Message from Sueanne Margarita, MD sent at 04/11/2022  6:31 PM EDT ----- 2D echo showed normal heart function with moderately thickened heart muscle, increased stiffness of heart muscle called diastolic dysfunction likely related to age. She had moderate basal septal hypertrophy on cMRI in 2021 with no HOCM and her findings at that time were related to hypertensive heart disease so no further workup indicated at this time

## 2022-05-07 ENCOUNTER — Ambulatory Visit: Payer: 59 | Attending: Cardiology

## 2022-05-07 DIAGNOSIS — E7800 Pure hypercholesterolemia, unspecified: Secondary | ICD-10-CM

## 2022-05-08 LAB — ALT: ALT: 41 IU/L — ABNORMAL HIGH (ref 0–32)

## 2022-05-11 ENCOUNTER — Telehealth: Payer: Self-pay

## 2022-05-11 DIAGNOSIS — E785 Hyperlipidemia, unspecified: Secondary | ICD-10-CM

## 2022-05-11 DIAGNOSIS — E7800 Pure hypercholesterolemia, unspecified: Secondary | ICD-10-CM

## 2022-05-11 NOTE — Telephone Encounter (Signed)
-----   Message from Quintella Reichert, MD sent at 05/09/2022  7:37 PM EDT ----- Stable ALT - avoid ETOH - repeat in 3 months

## 2022-05-11 NOTE — Telephone Encounter (Signed)
The patient has been notified of the result and verbalized understanding.  All questions (if any) were answered. Frutoso Schatz, RN 05/11/2022 9:57 AM  Repeat labs have been ordered and scheduled.

## 2022-05-18 ENCOUNTER — Ambulatory Visit: Payer: 59 | Admitting: Podiatry

## 2022-05-18 ENCOUNTER — Ambulatory Visit (INDEPENDENT_AMBULATORY_CARE_PROVIDER_SITE_OTHER): Payer: 59 | Admitting: Podiatry

## 2022-05-18 DIAGNOSIS — B351 Tinea unguium: Secondary | ICD-10-CM | POA: Diagnosis not present

## 2022-05-18 DIAGNOSIS — E1149 Type 2 diabetes mellitus with other diabetic neurological complication: Secondary | ICD-10-CM | POA: Diagnosis not present

## 2022-05-18 DIAGNOSIS — M79674 Pain in right toe(s): Secondary | ICD-10-CM | POA: Diagnosis not present

## 2022-05-18 DIAGNOSIS — M79675 Pain in left toe(s): Secondary | ICD-10-CM

## 2022-05-18 NOTE — Progress Notes (Signed)
Subjective: Chief Complaint  Patient presents with   Nail Problem    Rm 13 Rfc bilateral nail trim 67-2.      82 year old female presents with above concerns.  She is the nails are causing discomfort again as they are getting elongated and rubbing.  I previously ordered a compound cream to help with the neuropathy that she has had to receive this.  She is still taking gabapentin.  She does tend to get sleepy.  Objective: AAO x3, NAD DP/PT pulses palpable bilaterally, CRT less than 3 seconds Sensation decreased with Semmes Weinstein monofilament Nails are hypertrophic, dystrophic, brittle, discolored, elongated 10. No surrounding redness or drainage. Tenderness nails 1-5 bilaterally. No open lesions or pre-ulcerative lesions are identified today. No significant pain otherwise today. No pain with calf compression, swelling, warmth, erythema  Assessment: 82 year old female with symptomatic onychomycosis, neuropathy, Plantar fasciitis  Plan: -All treatment options discussed with the patient including all alternatives, risks, complications.  -Will reorder compound cream through [facility] to help with neuropathy pain. -Sharply debrided the nails x 10 without any complications or bleeding -Patient encouraged to call the office with any questions, concerns, change in symptoms.   Vivi Barrack DPM

## 2022-05-25 ENCOUNTER — Other Ambulatory Visit (HOSPITAL_COMMUNITY): Payer: Self-pay | Admitting: Gastroenterology

## 2022-05-25 DIAGNOSIS — R112 Nausea with vomiting, unspecified: Secondary | ICD-10-CM

## 2022-06-10 ENCOUNTER — Encounter (HOSPITAL_COMMUNITY)
Admission: RE | Admit: 2022-06-10 | Discharge: 2022-06-10 | Disposition: A | Discharge status: Home / Self Care | Payer: 59 | Source: Ambulatory Visit | Attending: Gastroenterology | Admitting: Gastroenterology

## 2022-06-10 DIAGNOSIS — R112 Nausea with vomiting, unspecified: Secondary | ICD-10-CM | POA: Insufficient documentation

## 2022-06-10 MED ORDER — TECHNETIUM TC 99M SULFUR COLLOID
1.8200 | Freq: Once | INTRAVENOUS | Status: AC | PRN
  Administered 2022-06-10: 1.82 via ORAL

## 2022-06-17 ENCOUNTER — Encounter: Payer: Self-pay | Admitting: Cardiology

## 2022-06-17 ENCOUNTER — Ambulatory Visit: Payer: 59 | Attending: Cardiology | Admitting: Cardiology

## 2022-06-17 VITALS — BP 124/60 | HR 74 | Ht 61.0 in | Wt 176.4 lb

## 2022-06-17 DIAGNOSIS — Z79899 Other long term (current) drug therapy: Secondary | ICD-10-CM

## 2022-06-17 DIAGNOSIS — I272 Pulmonary hypertension, unspecified: Secondary | ICD-10-CM | POA: Diagnosis not present

## 2022-06-17 DIAGNOSIS — I119 Hypertensive heart disease without heart failure: Secondary | ICD-10-CM | POA: Diagnosis not present

## 2022-06-17 DIAGNOSIS — I2510 Atherosclerotic heart disease of native coronary artery without angina pectoris: Secondary | ICD-10-CM

## 2022-06-17 DIAGNOSIS — E785 Hyperlipidemia, unspecified: Secondary | ICD-10-CM

## 2022-06-17 DIAGNOSIS — G4733 Obstructive sleep apnea (adult) (pediatric): Secondary | ICD-10-CM | POA: Diagnosis not present

## 2022-06-17 DIAGNOSIS — I493 Ventricular premature depolarization: Secondary | ICD-10-CM

## 2022-06-17 NOTE — Addendum Note (Signed)
Addended by: Luellen Pucker on: 06/17/2022 02:32 PM   Modules accepted: Orders

## 2022-06-17 NOTE — Patient Instructions (Signed)
Medication Instructions:  Your physician recommends that you continue on your current medications as directed. Please refer to the Current Medication list given to you today.    *If you need a refill on your cardiac medications before your next appointment, please call your pharmacy*   Lab Work: Please complete a CMET, FLP and ALT in our lab before you leave today.  If you have labs (blood work) drawn today and your tests are completely normal, you will receive your results only by: MyChart Message (if you have MyChart) OR A paper copy in the mail If you have any lab test that is abnormal or we need to change your treatment, we will call you to review the results.   Testing/Procedures: None.    Follow-Up: At [facility], you and your health needs are our priority.  As part of our continuing mission to provide you with exceptional heart care, we have created designated Provider Care Teams.  These Care Teams include your primary Cardiologist (physician) and Advanced Practice Providers (APPs -  Physician Assistants and Nurse Practitioners) who all work together to provide you with the care you need, when you need it.  We recommend signing up for the patient portal called "MyChart".  Sign up information is provided on this After Visit Summary.  MyChart is used to connect with patients for Virtual Visits (Telemedicine).  Patients are able to view lab/test results, encounter notes, upcoming appointments, etc.  Non-urgent messages can be sent to your provider as well.   To learn more about what you can do with MyChart, go to [email or web address].    Your next appointment:   1 year(s)  Provider:   Armanda Magic, MD

## 2022-06-17 NOTE — Progress Notes (Signed)
Cardiology Office Note:    Date:  06/17/2022   ID:  Nancy [name], DOB 1940/08/19, MRN [id number]  PCP:  Hillery Aldo, NP  Cardiologist:  Armanda Magic, MD    Referring MD: Hillery Aldo, NP   Chief Complaint  Patient presents with   Coronary Artery Disease   Hypertension   Hyperlipidemia   Sleep Apnea    History of Present Illness:    Nancy [name] is a 82 y.o. female with a hx of HTN and diastolic dysfunction .  She also has a history of mild pulmonary hypertension by 2D echocardiogram in July 2013.  She had a Lexiscan Myoview for chest pain 10/13/2016 which showed no inducible ischemia. 2D echo in 2019 showed  normal LVF with EF 60-65% with moderate LVH and elevated filling pressures. She also has a hx of OSA and is on PAP therapy.   She also has a history of atypical CP and DOE and coronary CTA was done which showed minimal CAD of the LM, mid and distal LAD and her CP was felt to be non cardiac in origin. 2D echo was normal with severe ASSH, no PHTN and cardiac MRI was done showing normal LVF with EF 73%, moderate BSH, normal RV and no evidence of cardiac amyloid or HOCM and findings c/w hypertensive heart disease.   She is here today for followup and is doing well.  She denies any chest pain or pressure, SOB, DOE (except with extreme exertion and unchanged), PND, orthopnea, palpitations or syncope. She has LE edema off and on.  She is compliant with her meds and is tolerating meds with no SE.    She has been using her CPAP off and on because she does not sleep well and mainly sleeps in her recliner.  She tolerates the mask and feels the pressure is adequate.   She denies any significant mouth or nasal dryness or nasal congestion.  She does not think that he snores.    Past Medical History:  Diagnosis Date   Allergy    Anemia    Aortic atherosclerosis (HCC)    Atrial tachycardia    Breast cyst    left breast   CAD (coronary artery disease), native coronary artery     minimal ASCAD to the distal LM and mid LAD with 0-24% stenosis and Calcium score of 26.   Chest pain    CKD (chronic kidney disease), stage III (HCC)    Complication of anesthesia 1986   slow to awaken after cholecystectomy   Diastolic dysfunction    with mild pulmonary HTN by echo 07/2011   Gastric ulcer    GERD (gastroesophageal reflux disease)    Gout    Hyperlipidemia    Hypertension    Hypertensive heart disease    Neuropathy    from diabetes per patient   Osteoarthritis    Osteopenia    Shortness of breath dyspnea    with activity   Sinus pause    Spine deformity    lower spine   Type II diabetes mellitus (HCC)     Past Surgical History:  Procedure Laterality Date   ABDOMINAL ADHESION SURGERY     ABDOMINAL HYSTERECTOMY     ANUS SURGERY     for torn tissue   APPENDECTOMY     BREAST BIOPSY     BREAST LUMPECTOMY     left   CHOLECYSTECTOMY  1986   COLONOSCOPY     ESOPHAGOGASTRODUODENOSCOPY (EGD) WITH PROPOFOL N/A 07/18/2013  Procedure: ESOPHAGOGASTRODUODENOSCOPY (EGD) WITH PROPOFOL;  Surgeon: Willis Modena, MD;  Location: [facility];  Service: Endoscopy;  Laterality: N/A;   EUS N/A 07/18/2013   Procedure: ESOPHAGEAL ENDOSCOPIC ULTRASOUND (EUS) RADIAL;  Surgeon: Willis Modena, MD;  Location: [facility];  Service: Endoscopy;  Laterality: N/A;   IR GENERIC HISTORICAL  10/30/2015   IR US GUIDE VASC ACCESS RIGHT 10/30/2015 Barnetta Chapel, PA-C [facility]   IR GENERIC HISTORICAL  10/30/2015   IR FLUORO GUIDE CV LINE RIGHT 10/30/2015 Barnetta Chapel, PA-C [facility]   LUMBAR WOUND DEBRIDEMENT N/A 10/01/2015   Procedure: LUMBAR WOUND DEBRIDEMENT;  Surgeon: Venita Lick, MD;  [facility]: [facility];  Service: Orthopedics;  Laterality: N/A;   LUMBAR WOUND DEBRIDEMENT N/A 10/15/2015   Procedure: [facility] OUT AND CLOSURE OF BACK WOUND;  Surgeon: Venita Lick, MD;  Location: [facility];  Service: Orthopedics;  Laterality: N/A;   MASS EXCISION Left 07/24/2015   Procedure: EXCISION MASS LEFT  LONG FINGER;  Surgeon: Betha Loa, MD;  Location: [facility];  Service: Orthopedics;  Laterality: Left;   SPINAL FUSION N/A 09/17/2015   Procedure: FUSION POSTERIOR SPINAL MULTILEVEL L4- S1;  Surgeon: Venita Lick, MD;  [facility]: [facility];  Service: Orthopedics;  Laterality: N/A;   TRANSFORAMINAL LUMBAR INTERBODY FUSION (TLIF) WITH PEDICLE SCREW FIXATION 1 LEVEL N/A 09/17/2015   Procedure: TRANSFORAMINAL LUMBAR INTERBODY FUSION (TLIF) WITH PEDICLE SCREW FIXATION 1 LEVEL , Lumbar 4-5;  Surgeon: Venita Lick, MD;  [facility];  Service: Orthopedics;  Laterality: N/A;    Current Medications: Current Meds  Medication Sig   allopurinol (ZYLOPRIM) 300 MG tablet Take 300 mg by mouth daily.    amLODipine (NORVASC) 5 MG tablet Take 5 mg by mouth daily.   aspirin EC 81 MG tablet Take 81 mg by mouth daily.   atorvastatin (LIPITOR) 10 MG tablet Take 1 tablet by mouth once daily   Blood Glucose Monitoring Suppl (ONETOUCH VERIO IQ SYSTEM) w/Device KIT 1 kit by Does not apply route 2 (two) times daily. E11.65   colchicine 0.6 MG tablet Take 0.6 mg by mouth daily.   dapagliflozin propanediol (FARXIGA) 10 MG TABS tablet Take 1 tablet (10 mg total) by mouth daily before breakfast.   famotidine (PEPCID) 20 MG tablet Take 20 mg by mouth 2 (two) times daily.   furosemide (LASIX) 20 MG tablet Take 1 tablet (20 mg total) by mouth daily as needed (for weight gain of >3lbs in 1 day or >5lbs in 1 week).   gabapentin (NEURONTIN) 300 MG capsule Take 600 mg by mouth 3 (three) times daily.   glucose blood (CARESENS N GLUCOSE TEST) test strip 1 each by Other route daily in the afternoon. Use as instructed   losartan (COZAAR) 25 MG tablet Take 25 mg by mouth at bedtime.   metFORMIN (GLUCOPHAGE) 1000 MG tablet Take 1 tablet (1,000 mg total) by mouth daily with breakfast.   Multiple Vitamins tablet Take 1 tablet by mouth daily.   omeprazole (PRILOSEC) 20 MG capsule Take 20 mg by mouth daily as needed (acid  reflux).   Semaglutide (RYBELSUS) 7 MG TABS Take 1 tablet (7 mg total) by mouth daily.   traMADol (ULTRAM) 50 MG tablet Take 50 mg by mouth every 6 (six) hours as needed for moderate pain or severe pain.     Allergies:   Ketorolac tromethamine, Morphine, Morphine sulfate, and Codeine   Social History   Socioeconomic History   Marital status: Divorced    Spouse name: Not on file  Number of children: 4   Years of education: 12   Highest education level: Not on file  Occupational History   Not on file  Tobacco Use   Smoking status: Never   Smokeless tobacco: Never  Vaping Use   Vaping Use: Never used  Substance and Sexual Activity   Alcohol use: No    Comment: occasional wine   Drug use: No   Sexual activity: Not Currently  Other Topics Concern   Not on file  Social History Narrative   Patient is single, has 3 children living 1 deceased   Patient is right handed   Education level is 12   Caffeine consumption is 0   Social Determinants of Corporate investment banker Strain: Not on file  Food Insecurity: Not on file  Transportation Needs: Not on file  Physical Activity: Not on file  Stress: Not on file  Social Connections: Not on file     Family History: The patient's family history includes Cancer in her sister; Heart disease in her mother.  ROS:   Please see the history of present illness.    ROS  All other systems reviewed and negative.   EKGs/Labs/Other Studies Reviewed:    The following studies were reviewed today: PAP compliance download  EKG:  EKG is not ordered today  Recent Labs: 05/07/2022: ALT 41   Recent Lipid Panel    Component Value Date/Time   CHOL 125 03/09/2022 1524   TRIG 99 03/09/2022 1524   HDL 46 03/09/2022 1524   CHOLHDL 2.7 03/09/2022 1524   CHOLHDL 4 01/28/2021 1036   VLDL 20.8 01/28/2021 1036   LDLCALC 60 03/09/2022 1524    Physical Exam:    VS:  BP 124/60   Pulse 74   Ht 5\' 1"  (1.549 m)   Wt 176 lb 6.4 oz (80 kg)    SpO2 98%   BMI 33.33 kg/m     Wt Readings from Last 3 Encounters:  06/17/22 176 lb 6.4 oz (80 kg)  03/09/22 180 lb (81.6 kg)  02/08/22 179 lb (81.2 kg)    GEN: Well nourished, well developed in no acute distress HEENT: Normal NECK: No JVD; No carotid bruits LYMPHATICS: No lymphadenopathy CARDIAC:RRR, no murmurs, rubs, gallops RESPIRATORY:  Clear to auscultation without rales, wheezing or rhonchi  ABDOMEN: Soft, non-tender, non-distended MUSCULOSKELETAL:  No edema; No deformity  SKIN: Warm and dry NEUROLOGIC:  Alert and oriented x 3 PSYCHIATRIC:  Normal affect  ASSESSMENT:    1. Hypertensive heart disease without heart failure   2. CAD in native artery   3. Pulmonary hypertension, unspecified (HCC)   4. OSA (obstructive sleep apnea)   5. PVC's (premature ventricular contractions)   6. Hyperlipidemia LDL goal <70     PLAN:    In order of problems listed above:  1.  HTN -BP is well-controlled on exam -Continue prescription drug management with amlodipine 5 mg daily, losartan 25 mg daily, Lopressor 25 mg twice daily with as needed refills -I have personally reviewed and interpreted outside labs performed by patient's PCP which showed serum creatinine 1.04 on 10/08/2021 -Repeat bmet to check potassium  2.  Pulmonary HTN -echo 2019 with no pulmonary HTN  -PASP and no PHTN on echo 11/2019 -2D echo 03/31/2022 showed EF 60 to 65% with grade 1 diastolic dysfunction and normal PA pressures  3. ASCAD/Chest pain -nuclear stress test 2018 and echo 2019 were normal -coronary CTA showed minimal CAD of the LM, mid and distal  LAD -felt to have non cardiac CP -cardiac MRI due to severe BSH on echo showed no HOCM and c/w hypertensive heart disease -She has not had any problems with chest pain since I saw her last -Continue prescription drug management with aspirin 81 mg daily, amlodipine 5 mg daily, atorvastatin 10 mg daily and Lopressor 12.5 mg twice daily  4.  OSA - The  patient is tolerating PAP therapy well without any problems.  The patient has been using and benefiting from PAP use and will continue to benefit from therapy.  -encouraged her to be more compliant with her device   5.  PVCs  -denies any palpitations -Continue Lopressor 25 mg twice daily  6.  HLD -LDL goal < 70 -I have personally reviewed and interpreted outside labs performed by patient's PCP which showed LDL 71 and HDL 32 on 01/28/2021 -Repeat FLP and ALT -Continue drug management with atorvastatin 10 mg daily with as needed refills  Followup with me in 3 months for her sleep   Medication Adjustments/Labs and Tests Ordered: Current medicines are reviewed at length with the patient today.  Concerns regarding medicines are outlined above.  No orders of the defined types were placed in this encounter.  No orders of the defined types were placed in this encounter.   Signed, Armanda Magic, MD  06/17/2022 2:23 PM    [facility]

## 2022-06-18 ENCOUNTER — Telehealth: Payer: Self-pay

## 2022-06-18 LAB — COMPREHENSIVE METABOLIC PANEL
ALT: 37 IU/L — ABNORMAL HIGH (ref 0–32)
AST: 47 IU/L — ABNORMAL HIGH (ref 0–40)
Albumin/Globulin Ratio: 1.1 — ABNORMAL LOW (ref 1.2–2.2)
Albumin: 4.2 g/dL (ref 3.8–4.8)
Alkaline Phosphatase: 157 IU/L — ABNORMAL HIGH (ref 44–121)
BUN/Creatinine Ratio: 17 (ref 12–28)
BUN: 19 mg/dL (ref 8–27)
Bilirubin Total: 1.2 mg/dL (ref 0.0–1.2)
CO2: 25 mmol/L (ref 20–29)
Calcium: 10 mg/dL (ref 8.7–10.3)
Chloride: 102 mmol/L (ref 96–106)
Creatinine, Ser: 1.1 mg/dL — ABNORMAL HIGH (ref 0.57–1.00)
Globulin, Total: 3.7 g/dL (ref 1.5–4.5)
Glucose: 189 mg/dL — ABNORMAL HIGH (ref 70–99)
Potassium: 4.4 mmol/L (ref 3.5–5.2)
Sodium: 141 mmol/L (ref 134–144)
Total Protein: 7.9 g/dL (ref 6.0–8.5)
eGFR: 51 mL/min/{1.73_m2} — ABNORMAL LOW (ref >59)

## 2022-06-18 LAB — LIPID PANEL
Chol/HDL Ratio: 3 ratio (ref 0.0–4.4)
Cholesterol, Total: 101 mg/dL (ref 100–199)
HDL: 34 mg/dL — ABNORMAL LOW (ref >39)
LDL Chol Calc (NIH): 46 mg/dL (ref 0–99)
Triglycerides: 111 mg/dL (ref 0–149)
VLDL Cholesterol Cal: 21 mg/dL (ref 5–40)

## 2022-06-18 NOTE — Telephone Encounter (Signed)
Reviewed results with patient who verbalizes understanding of elevated liver enzymes. Patient states she has a visit w/ her PCP already scheduled for 07/01/22. I advised I will forward these labs to PCP.

## 2022-06-18 NOTE — Telephone Encounter (Signed)
-----   Message from Quintella Reichert, MD sent at 06/18/2022  7:42 AM EDT ----- Labs look good except for a mildly elevated liver function test.  Her alkaline phosphatase which is one of her liver test is also elevated which is new.  Please forward these labs to her PCP to address and set up an office visit with her PCP for this

## 2022-06-22 ENCOUNTER — Encounter: Payer: Self-pay | Admitting: Physical Medicine and Rehabilitation

## 2022-07-05 ENCOUNTER — Other Ambulatory Visit: Payer: Self-pay | Admitting: Cardiology

## 2022-07-12 ENCOUNTER — Other Ambulatory Visit (INDEPENDENT_AMBULATORY_CARE_PROVIDER_SITE_OTHER): Payer: 59

## 2022-07-12 ENCOUNTER — Ambulatory Visit (INDEPENDENT_AMBULATORY_CARE_PROVIDER_SITE_OTHER): Payer: 59 | Admitting: Orthopedic Surgery

## 2022-07-12 DIAGNOSIS — M25562 Pain in left knee: Secondary | ICD-10-CM

## 2022-07-12 DIAGNOSIS — M25561 Pain in right knee: Secondary | ICD-10-CM

## 2022-07-12 DIAGNOSIS — M170 Bilateral primary osteoarthritis of knee: Secondary | ICD-10-CM

## 2022-07-13 NOTE — Progress Notes (Signed)
Office Visit Note   Patient: Nancy [name]           Date of Birth: [birth year]           MRN: [id number] Visit Date: 07/12/2022 Requested by: Hillery Aldo, NP [address] [place],  [place] [address] PCP: Hillery Aldo, NP  Subjective: Chief Complaint  Patient presents with   Right Knee - Pain   Left Knee - Pain    HPI: Nancy [name] is a 82 y.o. female who presents to the office reporting bilateral knee pain left worse than right.  The pain is worsening.  She ambulates with a cane.  Pain does not wake her from sleep at night.  She does report stiffness after sitting when she tries to get up.  Patient had prior bilateral cortisone injections in January of this year which did help.  Prior arthroscopic surgery in the left knee years ago.  Takes Neurontin for pain relief which helps some.  Last hemoglobin A1c 6.1.[name]                ROS: All systems reviewed are negative as they relate to the chief complaint within the history of present illness.  Patient denies fevers or chills.  Assessment & Plan: Visit Diagnoses:  1. Pain in both knees, unspecified chronicity     Plan: Impression is bilateral knee pain left worse than right with worsening symptoms.  Plan is bilateral cortisone injections today.  She is not really too keen on knee replacement surgery.  She will consider her options but as long as the shots are working she will likely continue with them.  Follow-up with [place] as needed.  Follow-Up Instructions: No follow-ups on file.   Orders:  Orders Placed This Encounter  Procedures   XR Knee 1-2 Views Right   XR Knee 1-2 Views Left   No orders of the defined types were placed in this encounter.     Procedures: Large Joint Inj: bilateral knee on 07/12/2022 9:57 PM Indications: diagnostic evaluation, joint swelling and pain Details: 18 G 1.5 in needle, superolateral approach  Arthrogram: No  Medications (Right): 5 mL lidocaine 1 %; 4 mL bupivacaine 0.25 %; 40 mg  methylPREDNISolone acetate 40 MG/ML Medications (Left): 5 mL lidocaine 1 %; 4 mL bupivacaine 0.25 %; 40 mg methylPREDNISolone acetate 40 MG/ML Outcome: tolerated well, no immediate complications Procedure, treatment alternatives, risks and benefits explained, specific risks discussed. Consent was given by the patient. Immediately prior to procedure a time out was called to verify the correct patient, procedure, equipment, support staff and site/side marked as required. Patient was prepped and draped in the usual sterile fashion.     Clinical Data: No additional findings.  Objective: Vital Signs: There were no vitals taken for this visit.  Physical Exam:  Constitutional: Patient appears well-developed HEENT:  Head: Normocephalic Eyes:EOM are normal Neck: Normal range of motion Cardiovascular: Normal rate Pulmonary/chest: Effort normal Neurologic: Patient is alert Skin: Skin is warm Psychiatric: Patient has normal mood and affect  Ortho Exam: Ortho exam demonstrates palpable pedal pulses with range of motion in both knees of 10-100 on the left and 10-1 10 on the right.  No real effusion in either knee.  No groin pain on the right or left-hand side with internal or external rotation of the leg.  Extensor mechanism intact and nontender.  No other masses lymphadenopathy or skin changes noted in either knee region.  Specialty Comments:  No specialty comments available.  Imaging:  No results found.   PMFS History: Patient Active Problem List   Diagnosis Date Noted   Dyslipidemia 05/28/2021   Syncope 04/28/2021   UTI (urinary tract infection) 04/28/2021   AKI (acute kidney injury) (HCC) 04/28/2021   OSA on CPAP 04/28/2021   Aortic atherosclerosis (HCC)    Type 2 diabetes mellitus with stage 3b chronic kidney disease, without long-term current use of insulin (HCC) 03/03/2020   Polyneuropathy associated with underlying disease (HCC) 03/03/2020   CAD (coronary artery disease), native  coronary artery    Pulmonary hypertension, unspecified (HCC) 07/29/2017   Chest pain 10/07/2016   SOB (shortness of breath) 10/07/2016   Pseudogout of bilateral knees 03/16/2016   Osteoarthritis 03/16/2016   SIRS (systemic inflammatory response syndrome) (HCC) 03/14/2016   Elevated sed rate 03/14/2016   Primary osteoarthritis of both knees 01/01/2016   Postoperative wound infection 12/18/2015   Difficulty in walking, not elsewhere classified    Status post lumbar spinal fusion    Type 2 diabetes mellitus with diabetic polyneuropathy, without long-term current use of insulin (HCC)    Infection due to ESBL-producing Escherichia coli    Angioedema    Hyperlipidemia 10/01/2015   Gait instability 02/19/2013   Pulmonary hyperinflation 01/17/2013   Chronic diastolic CHF (congestive heart failure) (HCC)    Essential hypertension, benign 01/03/2013   Past Medical History:  Diagnosis Date   Allergy    Anemia    Aortic atherosclerosis (HCC)    Atrial tachycardia    Breast cyst    left breast   CAD (coronary artery disease), native coronary artery    minimal ASCAD to the distal LM and mid LAD with 0-24% stenosis and Calcium score of 26.   Chest pain    CKD (chronic kidney disease), stage III (HCC)    Complication of anesthesia 1986   slow to awaken after cholecystectomy   Diastolic dysfunction    with mild pulmonary HTN by echo 07/2011   Gastric ulcer    GERD (gastroesophageal reflux disease)    Gout    Hyperlipidemia    Hypertension    Hypertensive heart disease    Neuropathy    from diabetes per patient   Osteoarthritis    Osteopenia    Shortness of breath dyspnea    with activity   Sinus pause    Spine deformity    lower spine   Type II diabetes mellitus (HCC)     Family History  Problem Relation Age of Onset   Heart disease Mother    Cancer Sister        ovarian    Past Surgical History:  Procedure Laterality Date   ABDOMINAL ADHESION SURGERY     ABDOMINAL  HYSTERECTOMY     ANUS SURGERY     for torn tissue   APPENDECTOMY     BREAST BIOPSY     BREAST LUMPECTOMY     left   CHOLECYSTECTOMY  1986   COLONOSCOPY     ESOPHAGOGASTRODUODENOSCOPY (EGD) WITH PROPOFOL N/A 07/18/2013   Procedure: ESOPHAGOGASTRODUODENOSCOPY (EGD) WITH PROPOFOL;  Surgeon: Willis Modena, MD;  [facility];  Service: Endoscopy;  Laterality: N/A;   EUS N/A 07/18/2013   Procedure: ESOPHAGEAL ENDOSCOPIC ULTRASOUND (EUS) RADIAL;  Surgeon: Willis Modena, MD;  [facility];  Service: Endoscopy;  Laterality: N/A;   IR GENERIC HISTORICAL  10/30/2015   IR US GUIDE VASC ACCESS RIGHT 10/30/2015 Barnetta Chapel, PA-C [facility]   IR GENERIC HISTORICAL  10/30/2015   IR FLUORO GUIDE  CV LINE RIGHT 10/30/2015 Barnetta Chapel, PA-C [facility]   LUMBAR WOUND DEBRIDEMENT N/A 10/01/2015   Procedure: LUMBAR WOUND DEBRIDEMENT;  Surgeon: Venita Lick, MD;  Location: [facility];  Service: Orthopedics;  Laterality: N/A;   LUMBAR WOUND DEBRIDEMENT N/A 10/15/2015   Procedure: [facility] OUT AND CLOSURE OF BACK WOUND;  Surgeon: Venita Lick, MD;  [facility];  Service: Orthopedics;  Laterality: N/A;   MASS EXCISION Left 07/24/2015   Procedure: EXCISION MASS LEFT LONG FINGER;  Surgeon: Betha Loa, MD;  Location: [facility];  Service: Orthopedics;  Laterality: Left;   SPINAL FUSION N/A 09/17/2015   Procedure: FUSION POSTERIOR SPINAL MULTILEVEL L4- S1;  Surgeon: Venita Lick, MD;  [facility];  Service: Orthopedics;  Laterality: N/A;   TRANSFORAMINAL LUMBAR INTERBODY FUSION (TLIF) WITH PEDICLE SCREW FIXATION 1 LEVEL N/A 09/17/2015   Procedure: TRANSFORAMINAL LUMBAR INTERBODY FUSION (TLIF) WITH PEDICLE SCREW FIXATION 1 LEVEL , Lumbar 4-5;  Surgeon: Venita Lick, MD;  [facility];  Service: Orthopedics;  Laterality: N/A;   Social History   Occupational History   Not on file  Tobacco Use   Smoking status: Never   Smokeless tobacco: Never  Vaping Use   Vaping Use:  Never used  Substance and Sexual Activity   Alcohol use: No    Comment: occasional wine   Drug use: No   Sexual activity: Not Currently

## 2022-07-14 MED ORDER — BUPIVACAINE HCL 0.25 % IJ SOLN
4.0000 mL | INTRAMUSCULAR | Status: AC | PRN
Start: 2022-07-12 — End: 2022-07-12
  Administered 2022-07-12: 4 mL via INTRA_ARTICULAR

## 2022-07-14 MED ORDER — METHYLPREDNISOLONE ACETATE 40 MG/ML IJ SUSP
40.0000 mg | INTRAMUSCULAR | Status: AC | PRN
Start: 2022-07-12 — End: 2022-07-12
  Administered 2022-07-12: 40 mg via INTRA_ARTICULAR

## 2022-07-14 MED ORDER — LIDOCAINE HCL 1 % IJ SOLN
5.0000 mL | INTRAMUSCULAR | Status: AC | PRN
Start: 2022-07-12 — End: 2022-07-12
  Administered 2022-07-12: 5 mL

## 2022-07-19 ENCOUNTER — Telehealth: Payer: Self-pay

## 2022-07-19 MED ORDER — GLIPIZIDE 5 MG PO TABS: 5.0000 mg | ORAL_TABLET | Freq: Two times a day (BID) | ORAL | 3 refills | Status: DC

## 2022-07-19 NOTE — Telephone Encounter (Signed)
Patient has been advised and will pick up medication  

## 2022-07-19 NOTE — Telephone Encounter (Signed)
Patient states that she received 2 steroid injections in her knee on 07/12/22. On 7/2 her sugar was 600  fasting. Patient states that she has been taking the Metformin, Farxiga and Rybelsus. Patient skip one doses of Rybelsus on Thursday 07/15/22.  BS was 363 yesterday at 1pm and this morning it was 307 fasting.

## 2022-07-22 ENCOUNTER — Emergency Department (HOSPITAL_COMMUNITY)
Admission: EM | Admit: 2022-07-22 | Discharge: 2022-07-22 | Disposition: A | Discharge status: Home / Self Care | Payer: 59 | Attending: Emergency Medicine | Admitting: Emergency Medicine

## 2022-07-22 ENCOUNTER — Emergency Department (HOSPITAL_COMMUNITY): Payer: 59

## 2022-07-22 ENCOUNTER — Other Ambulatory Visit: Payer: Self-pay

## 2022-07-22 DIAGNOSIS — Z7982 Long term (current) use of aspirin: Secondary | ICD-10-CM | POA: Insufficient documentation

## 2022-07-22 DIAGNOSIS — Z79899 Other long term (current) drug therapy: Secondary | ICD-10-CM | POA: Diagnosis not present

## 2022-07-22 DIAGNOSIS — Z7984 Long term (current) use of oral hypoglycemic drugs: Secondary | ICD-10-CM | POA: Diagnosis not present

## 2022-07-22 DIAGNOSIS — N183 Chronic kidney disease, stage 3 unspecified: Secondary | ICD-10-CM | POA: Diagnosis not present

## 2022-07-22 DIAGNOSIS — I130 Hypertensive heart and chronic kidney disease with heart failure and stage 1 through stage 4 chronic kidney disease, or unspecified chronic kidney disease: Secondary | ICD-10-CM | POA: Diagnosis not present

## 2022-07-22 DIAGNOSIS — I5030 Unspecified diastolic (congestive) heart failure: Secondary | ICD-10-CM | POA: Diagnosis not present

## 2022-07-22 DIAGNOSIS — Z1152 Encounter for screening for COVID-19: Secondary | ICD-10-CM | POA: Insufficient documentation

## 2022-07-22 DIAGNOSIS — N179 Acute kidney failure, unspecified: Secondary | ICD-10-CM | POA: Insufficient documentation

## 2022-07-22 DIAGNOSIS — J189 Pneumonia, unspecified organism: Secondary | ICD-10-CM | POA: Insufficient documentation

## 2022-07-22 DIAGNOSIS — R55 Syncope and collapse: Secondary | ICD-10-CM | POA: Diagnosis present

## 2022-07-22 LAB — BASIC METABOLIC PANEL
Anion gap: 15 (ref 5–15)
BUN: 26 mg/dL — ABNORMAL HIGH (ref 8–23)
CO2: 17 mmol/L — ABNORMAL LOW (ref 22–32)
Calcium: 9.4 mg/dL (ref 8.9–10.3)
Chloride: 105 mmol/L (ref 98–111)
Creatinine, Ser: 1.4 mg/dL — ABNORMAL HIGH (ref 0.44–1.00)
GFR, Estimated: 38 mL/min — ABNORMAL LOW (ref >60)
Glucose, Bld: 255 mg/dL — ABNORMAL HIGH (ref 70–99)
Potassium: 3.6 mmol/L (ref 3.5–5.1)
Sodium: 137 mmol/L (ref 135–145)

## 2022-07-22 LAB — CBC WITH DIFFERENTIAL/PLATELET
Abs Immature Granulocytes: 0.06 10*3/uL (ref 0.00–0.07)
Basophils Absolute: 0 10*3/uL (ref 0.0–0.1)
Basophils Relative: 0 %
Eosinophils Absolute: 0.1 10*3/uL (ref 0.0–0.5)
Eosinophils Relative: 1 %
HCT: 37.1 % (ref 36.0–46.0)
Hemoglobin: 12.5 g/dL (ref 12.0–15.0)
Immature Granulocytes: 1 %
Lymphocytes Relative: 25 %
Lymphs Abs: 2.6 10*3/uL (ref 0.7–4.0)
MCH: 29.1 pg (ref 26.0–34.0)
MCHC: 33.7 g/dL (ref 30.0–36.0)
MCV: 86.3 fL (ref 80.0–100.0)
Monocytes Absolute: 0.5 10*3/uL (ref 0.1–1.0)
Monocytes Relative: 5 %
Neutro Abs: 6.9 10*3/uL (ref 1.7–7.7)
Neutrophils Relative %: 68 %
Platelets: 178 10*3/uL (ref 150–400)
RBC: 4.3 MIL/uL (ref 3.87–5.11)
RDW: 14.3 % (ref 11.5–15.5)
WBC: 10.2 10*3/uL (ref 4.0–10.5)
nRBC: 0 % (ref 0.0–0.2)

## 2022-07-22 LAB — CBG MONITORING, ED: Glucose-Capillary: 275 mg/dL — ABNORMAL HIGH (ref 70–99)

## 2022-07-22 LAB — MAGNESIUM: Magnesium: 2 mg/dL (ref 1.7–2.4)

## 2022-07-22 LAB — SARS CORONAVIRUS 2 BY RT PCR: SARS Coronavirus 2 by RT PCR: NEGATIVE

## 2022-07-22 LAB — BRAIN NATRIURETIC PEPTIDE: B Natriuretic Peptide: 104.2 pg/mL — ABNORMAL HIGH (ref 0.0–100.0)

## 2022-07-22 LAB — TROPONIN I (HIGH SENSITIVITY): Troponin I (High Sensitivity): 9 ng/L (ref <18)

## 2022-07-22 MED ORDER — LACTATED RINGERS IV BOLUS
500.0000 mL | Freq: Once | INTRAVENOUS | Status: AC
  Administered 2022-07-22: 500 mL via INTRAVENOUS

## 2022-07-22 MED ORDER — DOXYCYCLINE HYCLATE 100 MG PO CAPS: 100.0000 mg | ORAL_CAPSULE | Freq: Two times a day (BID) | ORAL | 0 refills | Status: AC

## 2022-07-22 MED ORDER — POTASSIUM CHLORIDE CRYS ER 20 MEQ PO TBCR
40.0000 meq | EXTENDED_RELEASE_TABLET | Freq: Once | ORAL | Status: AC
  Administered 2022-07-22: 40 meq via ORAL
  Filled 2022-07-22: qty 2

## 2022-07-22 NOTE — Consult Note (Addendum)
Cardiology Consultation   Patient ID: MARCELE KOSTA MRN: [id number]; DOB: 1940/09/15  Admit date: 07/22/2022 Date of Consult: 07/22/2022  PCP:  Hillery Aldo, NP   [facility] Providers Cardiologist:  Armanda Magic, MD     Patient Profile:   KEYANNI WHITTINGHILL is a 82 y.o. female with a hx of coronary artery disease, hypertension, hyperlipidemia, sleep apnea, atrial tachycardia, CKD stage III, GERD, diabetes with diabetic neuropathy who is being seen 07/22/2022 for the evaluation of syncope at the request of Dr. Eloise Harman.  History of Present Illness:   Ms. Brammer is a 82 year old female with above medical history who is followed by Dr. Mayford Knife.  Per chart review, patient underwent echocardiogram in 01/2013 that showed EF 65-70%, no regional wall motion abnormalities.  Later wore a cardiac monitor in 08/2014 that showed predominantly normal sinus rhythm with an average heart rate of 74 bpm with occasional PACs.  Nuclear stress test in 10/2016 showed no evidence of ischemia/infarction, EF 70%.  Coronary CTA in 10/2017 showed a coronary artery calcium score of 6.5 (30th percentile), no obstructive CAD.  In 08/2019, patient complained of increasing shortness of breath and chest pain.  Coronary CTA on 09/13/2019 showed a coronary calcium score of 26 (48th percentile), minimal atherosclerosis.  Cardiac monitor 09/2019 showed this sinus bradycardia, normal sinus rhythm, sinus tachycardia with average heart rate 71 bpm.  There were a few episodes of nonsustained atrial tachycardia, longest lasting 5 beats.  In 11/2019, patient continued to complain of shortness of breath.  She underwent echocardiogram on 12/01/2019 that showed EF 60-65%, no regional wall motion abnormalities, severe LVH, normal RV systolic function.  Cardiac MRI on 01/03/2020 showed normal LV size with moderate basal septal hypertrophy and hyperdynamic systolic function (LVEF 73%.  There were no regional wall motion  abnormalities, normal RV size thickness and systolic function.  There is no evidence of cardiac amyloidosis and findings were most consistent with hypertensive heart disease.  Patient was admitted in 04/2021 for evaluation of syncope.  At that time, patient had been sitting on the commode to have a bowel movement and felt lightheaded and passed out.  Underwent echocardiogram on 04/29/2021 that showed EF 60-65%, no regional wall motion abnormalities, moderate asymmetric LVH, normal RV systolic function.  Patient underwent coronary CTA on 04/29/2021 that showed coronary calcium score 48 (52nd percentile), nonobstructive CAD.  The most significant lesion was noncalcified plaque in the mid RCA causing mild (25-49%) stenosis.  Patient's most recent echocardiogram from 04/08/2022 showed EF 60-65%, no regional wall motion abnormalities, moderate asymmetric LVH, no LVOT obstruction, normal RV systolic function.  Patient presented to the ED on 7/11 after she had a syncopal episode.  Patient reported that for the past week, she been feeling poorly after she received a steroid shot in her knee. Initial vital signs in the ED showed HR 67 BPM, BP 128/60, oxygen 98% on room air. Labs showed glucose 275, Na 137, K 3.6, creatinine 1.40, WBC 10.2, hemoglobin 12.5, platelets 178. BNP 104.2. hsTn 9. CXR with increased intersitial markings, possibly due to low lung volumes.   On interview, patient reports that she had steroid shots in both of her knees about a week ago.  Last Friday, 7/5, she noted that her blood sugar was about 600.  Since then, she has reported feeling "bad".  She has been feeling more tired than usual and sometimes feels like her "head is heavy".  She also has been having some dizziness when she stands up  and tries to walk to the bathroom.  She has noted that she has been urinating more frequently.  Today, she noted that her blood sugar was in the 200s.  She drank coffee, some water, and ate a small sandwich  before leaving the house.  Her and her daughter went to go see Eldridge Dace speaking to school, and on the way there the patient again felt "bad".  She felt tired/fatigued, nauseous, weak. She sat down, but continued to feel poorly. Her daughter watched her head dip down to her chest and called EMS. With EMS, she vomited and reportedly felt better.  Patient denies chest pain, palpitations.   Past Medical History:  Diagnosis Date   Allergy    Anemia    Aortic atherosclerosis (HCC)    Atrial tachycardia    Breast cyst    left breast   CAD (coronary artery disease), native coronary artery    minimal ASCAD to the distal LM and mid LAD with 0-24% stenosis and Calcium score of 26.   Chest pain    CKD (chronic kidney disease), stage III (HCC)    Complication of anesthesia 1986   slow to awaken after cholecystectomy   Diastolic dysfunction    with mild pulmonary HTN by echo 07/2011   Gastric ulcer    GERD (gastroesophageal reflux disease)    Gout    Hyperlipidemia    Hypertension    Hypertensive heart disease    Neuropathy    from diabetes per patient   Osteoarthritis    Osteopenia    Shortness of breath dyspnea    with activity   Sinus pause    Spine deformity    lower spine   Type II diabetes mellitus (HCC)     Past Surgical History:  Procedure Laterality Date   ABDOMINAL ADHESION SURGERY     ABDOMINAL HYSTERECTOMY     ANUS SURGERY     for torn tissue   APPENDECTOMY     BREAST BIOPSY     BREAST LUMPECTOMY     left   CHOLECYSTECTOMY  1986   COLONOSCOPY     ESOPHAGOGASTRODUODENOSCOPY (EGD) WITH PROPOFOL N/A 07/18/2013   Procedure: ESOPHAGOGASTRODUODENOSCOPY (EGD) WITH PROPOFOL;  Surgeon: Willis Modena, MD;  Location: [facility];  Service: Endoscopy;  Laterality: N/A;   EUS N/A 07/18/2013   Procedure: ESOPHAGEAL ENDOSCOPIC ULTRASOUND (EUS) RADIAL;  Surgeon: Willis Modena, MD;  Location: [facility];  Service: Endoscopy;  Laterality: N/A;   IR GENERIC HISTORICAL  10/30/2015    IR US GUIDE VASC ACCESS RIGHT 10/30/2015 Barnetta Chapel, PA-C [facility]   IR GENERIC HISTORICAL  10/30/2015   IR FLUORO GUIDE CV LINE RIGHT 10/30/2015 Barnetta Chapel, PA-C [facility]   LUMBAR WOUND DEBRIDEMENT N/A 10/01/2015   Procedure: LUMBAR WOUND DEBRIDEMENT;  Surgeon: Venita Lick, MD;  Location: [facility];  Service: Orthopedics;  Laterality: N/A;   LUMBAR WOUND DEBRIDEMENT N/A 10/15/2015   Procedure: [facility] OUT AND CLOSURE OF BACK WOUND;  Surgeon: Venita Lick, MD;  Location: [facility];  Service: Orthopedics;  Laterality: N/A;   MASS EXCISION Left 07/24/2015   Procedure: EXCISION MASS LEFT LONG FINGER;  Surgeon: Betha Loa, MD;  Location: [facility];  Service: Orthopedics;  Laterality: Left;   SPINAL FUSION N/A 09/17/2015   Procedure: FUSION POSTERIOR SPINAL MULTILEVEL L4- S1;  Surgeon: Venita Lick, MD;  Location: [facility];  Service: Orthopedics;  Laterality: N/A;   TRANSFORAMINAL LUMBAR INTERBODY FUSION (TLIF) WITH PEDICLE SCREW FIXATION 1 LEVEL N/A 09/17/2015  Procedure: TRANSFORAMINAL LUMBAR INTERBODY FUSION (TLIF) WITH PEDICLE SCREW FIXATION 1 LEVEL , Lumbar 4-5;  Surgeon: Venita Lick, MD;  Location: [facility];  Service: Orthopedics;  Laterality: N/A;     Home Medications:  Prior to Admission medications   Medication Sig Start Date End Date Taking? Authorizing Provider  allopurinol (ZYLOPRIM) 300 MG tablet Take 300 mg by mouth daily.  10/07/18   [provider]  amLODipine (NORVASC) 5 MG tablet Take 5 mg by mouth daily. 10/29/19   [provider]  aspirin EC 81 MG tablet Take 81 mg by mouth daily.    [provider]  atorvastatin (LIPITOR) 10 MG tablet Take 1 tablet by mouth once daily 07/05/22   Turner, Cornelious Bryant, MD  Blood Glucose Monitoring Suppl (ONETOUCH VERIO IQ SYSTEM) w/Device KIT 1 kit by Does not apply route 2 (two) times daily. E11.65 08/24/18   Shamleffer, Konrad Dolores, MD  colchicine 0.6 MG tablet Take 0.6 mg by mouth daily.    [provider]  dapagliflozin propanediol (FARXIGA) 10 MG TABS tablet Take 1 tablet (10 mg total) by mouth daily before breakfast. 02/08/22   Shamleffer, Konrad Dolores, MD  famotidine (PEPCID) 20 MG tablet Take 20 mg by mouth 2 (two) times daily. 01/27/21   [provider]  furosemide (LASIX) 20 MG tablet Take 1 tablet (20 mg total) by mouth daily as needed (for weight gain of >3lbs in 1 day or >5lbs in 1 week). 04/29/21   Rolly Salter, MD  gabapentin (NEURONTIN) 300 MG capsule Take 600 mg by mouth 3 (three) times daily. 05/05/16   [provider]  glipiZIDE (GLUCOTROL) 5 MG tablet Take 1 tablet (5 mg total) by mouth 2 (two) times daily before a meal. 07/19/22   Shamleffer, Konrad Dolores, MD  glucose blood (CARESENS N GLUCOSE TEST) test strip 1 each by Other route daily in the afternoon. Use as instructed 10/06/21   Shamleffer, Konrad Dolores, MD  losartan (COZAAR) 25 MG tablet Take 25 mg by mouth at bedtime. 09/15/21   [provider]  metFORMIN (GLUCOPHAGE) 1000 MG tablet Take 1 tablet (1,000 mg total) by mouth daily with breakfast. 02/08/22   Shamleffer, Konrad Dolores, MD  metoprolol tartrate (LOPRESSOR) 50 MG tablet Take 0.5 tablets (25 mg total) by mouth 2 (two) times daily. 11/13/19 02/08/22  Dunn, Tacey Ruiz, PA-C  Multiple Vitamins tablet Take 1 tablet by mouth daily. 07/13/19   [provider]  omeprazole (PRILOSEC) 20 MG capsule Take 20 mg by mouth daily as needed (acid reflux).    [provider]  Semaglutide (RYBELSUS) 7 MG TABS Take 1 tablet (7 mg total) by mouth daily. 02/08/22   Shamleffer, Konrad Dolores, MD  traMADol (ULTRAM) 50 MG tablet Take 50 mg by mouth every 6 (six) hours as needed for moderate pain or severe pain. 12/09/20   [provider]    Inpatient Medications: Scheduled Meds:  Continuous Infusions:  PRN Meds:   Allergies:    Allergies  Allergen Reactions   Ketorolac Tromethamine Swelling    10/02/15 - given  Toradol + morphine and RN noted tongue swelling - given Epi + Benadryl Has taken ibuprofen in past w/o problem Other reaction(s): Unknown   Morphine Swelling    Face and tongue swelling   Morphine Sulfate     Other reaction(s): Unknown   Codeine Other (See Comments)    headache Other reaction(s): Unknown    Social History:   Social History   Socioeconomic History  Marital status: Divorced    Spouse name: Not on file   Number of children: 4   Years of education: 42   Highest education level: Not on file  Occupational History   Not on file  Tobacco Use   Smoking status: Never   Smokeless tobacco: Never  Vaping Use   Vaping status: Never Used  Substance and Sexual Activity   Alcohol use: No    Comment: occasional wine   Drug use: No   Sexual activity: Not Currently  Other Topics Concern   Not on file  Social History Narrative   Patient is single, has 3 children living 1 deceased   Patient is right handed   Education level is 12   Caffeine consumption is 0   Social Determinants of Corporate investment banker Strain: Not on file  Food Insecurity: Not on file  Transportation Needs: Not on file  Physical Activity: Not on file  Stress: Not on file  Social Connections: Not on file  Intimate Partner Violence: Not on file    Family History:    Family History  Problem Relation Age of Onset   Heart disease Mother    Cancer Sister        ovarian     ROS:  Please see the history of present illness.   All other ROS reviewed and negative.     Physical Exam/Data:   Vitals:   07/22/22 1430 07/22/22 1445 07/22/22 1500 07/22/22 1515  BP: 128/61 115/88 138/74 131/67  Pulse: 74 80 80 77  Resp: 12 18 14 13   Temp:      TempSrc:      SpO2: 94% 100% 98% 93%  Weight:      Height:        Intake/Output Summary (Last 24 hours) at 07/22/2022 1546 Last data filed at 07/22/2022 1510 Gross per 24 hour  Intake 500 ml  Output --  Net 500 ml      07/22/2022    1:31 PM  06/17/2022    1:33 PM 03/09/2022    2:54 PM  Last 3 Weights  Weight (lbs) 177 lb 176 lb 6.4 oz 180 lb  Weight (kg) 80.287 kg 80.015 kg 81.647 kg     Body mass index is 32.37 kg/m.  General:  Well nourished, well developed, in no acute distress. Laying in the bed with head elevated. Appears fatigued  HEENT: normal Neck: no JVD Vascular: Radial pulses 2+ bilaterally Cardiac:  normal S1, S2; RRR; no murmur  Lungs:  clear to auscultation bilaterally, no wheezing, rhonchi or rales. Normal work of breathing on room air  Abd: soft, nontender Ext: no edema in BLE  Musculoskeletal:  No deformities, BUE and BLE strength normal and equal Skin: warm and dry  Neuro:  CNs 2-12 intact, no focal abnormalities noted Psych:  Normal affect   EKG:  The EKG was personally reviewed and demonstrates:  Sinus rhythm, HR 70 BPM.  Telemetry:  Telemetry was personally reviewed and demonstrates:  NSR   Relevant CV Studies: Cardiac Studies & Procedures     STRESS TESTS  MYOCARDIAL PERFUSION IMAGING 10/13/2016  Narrative  Nuclear stress EF: 70%.  There was no ST segment deviation noted during stress.  The study is normal.  The left ventricular ejection fraction is hyperdynamic (>65%).  1. EF 70%, normal wall motion. 2. No evidence for ischemia/infarction.   ECHOCARDIOGRAM  ECHOCARDIOGRAM COMPLETE 04/08/2022  Narrative ECHOCARDIOGRAM REPORT    Patient Name:   Quinnetta P  Hewlett Date of Exam: 04/08/2022 Medical Rec #:  [id number]           Height:       61.0 in Accession #:    [id number]          Weight:       180.0 lb Date of Birth:  1940-09-24            BSA:          1.806 m Patient Age:    82 years            BP:           140/80 mmHg Patient Gender: F                   HR:           71 bpm. Exam Location:  [address]  Procedure: 2D Echo, Cardiac Doppler and Color Doppler  Indications:    I10 Hypertension  History:        Patient has prior history of Echocardiogram examinations,  most recent 04/29/2021. CAD, Signs/Symptoms:Shortness of Breath and Chest Pain; Risk Factors:Diabetes, Dyslipidemia and Sleep Apnea. Hypertensive heart disease without heart failure. Pulmonary hypertension. Sinus pause. Dyspnea on exertion. Aortic atherosclerosis. Atrial tachycardia.  Sonographer:    Cathie Beams RCS Referring Phys: [id number] TRACI R TURNER  IMPRESSIONS   1. No LVOT obstruction. Left ventricular ejection fraction, by estimation, is 60 to 65%. The left ventricle has normal function. The left ventricle has no regional wall motion abnormalities. There is moderate asymmetric left ventricular hypertrophy of the basal-septal segment. Left ventricular diastolic parameters are consistent with Grade I diastolic dysfunction (impaired relaxation). 2. Right ventricular systolic function is normal. The right ventricular size is normal. There is normal pulmonary artery systolic pressure. 3. No evidence of mitral valve regurgitation. 4. Aortic valve regurgitation is not visualized. 5. The inferior vena cava is normal in size with greater than 50% respiratory variability, suggesting right atrial pressure of 3 mmHg.  FINDINGS Left Ventricle: No LVOT obstruction. Left ventricular ejection fraction, by estimation, is 60 to 65%. The left ventricle has normal function. The left ventricle has no regional wall motion abnormalities. The left ventricular internal cavity size was normal in size. There is moderate asymmetric left ventricular hypertrophy of the basal-septal segment. Left ventricular diastolic parameters are consistent with Grade I diastolic dysfunction (impaired relaxation).  Right Ventricle: The right ventricular size is normal. Right ventricular systolic function is normal. There is normal pulmonary artery systolic pressure. The tricuspid regurgitant velocity is 1.87 m/s, and with an assumed right atrial pressure of 3 mmHg, the estimated right ventricular systolic pressure is 17.0  mmHg.  Left Atrium: Left atrial size was normal in size.  Right Atrium: Right atrial size was normal in size.  Pericardium: There is no evidence of pericardial effusion.  Mitral Valve: No evidence of mitral valve regurgitation.  Tricuspid Valve: Tricuspid valve regurgitation is mild.  Aortic Valve: Aortic valve regurgitation is not visualized.  Pulmonic Valve: Pulmonic valve regurgitation is not visualized.  Aorta: The aortic root and ascending aorta are structurally normal, with no evidence of dilitation.  Venous: The inferior vena cava is normal in size with greater than 50% respiratory variability, suggesting right atrial pressure of 3 mmHg.  IAS/Shunts: No atrial level shunt detected by color flow Doppler.   LEFT VENTRICLE PLAX 2D LVIDd:         3.20 cm   Diastology LVIDs:  1.50 cm   LV e' medial:    3.55 cm/s LV PW:         1.30 cm   LV E/e' medial:  29.0 LV IVS:        1.30 cm   LV e' lateral:   6.19 cm/s LVOT diam:     1.80 cm   LV E/e' lateral: 16.6 LV SV:         60 LV SV Index:   33 LVOT Area:     2.54 cm   RIGHT VENTRICLE RV Basal diam:  2.30 cm RV S prime:     6.75 cm/s TAPSE (M-mode): 1.6 cm RVSP:           17.0 mmHg  LEFT ATRIUM             Index        RIGHT ATRIUM           Index LA diam:        3.60 cm 1.99 cm/m   RA Pressure: 3.00 mmHg LA Vol (A2C):   36.5 ml 20.21 ml/m  RA Area:     11.50 cm LA Vol (A4C):   53.6 ml 29.68 ml/m  RA Volume:   21.30 ml  11.79 ml/m LA Biplane Vol: 48.1 ml 26.63 ml/m AORTIC VALVE LVOT Vmax:   101.00 cm/s LVOT Vmean:  67.700 cm/s LVOT VTI:    0.237 m  AORTA Ao Root diam: 3.10 cm Ao Asc diam:  3.30 cm  MITRAL VALVE                TRICUSPID VALVE MV Area (PHT): 3.99 cm     TR Peak grad:   14.0 mmHg MV Decel Time: 190 msec     TR Vmax:        187.00 cm/s MV E velocity: 103.00 cm/s  Estimated RAP:  3.00 mmHg MV A velocity: 125.00 cm/s  RVSP:           17.0 mmHg MV E/A ratio:  0.82 SHUNTS Systemic  VTI:  0.24 m Systemic Diam: 1.80 cm  Carolan Clines Electronically signed by Carolan Clines Signature Date/Time: 04/08/2022/12:24:53 PM    Final    MONITORS  CARDIAC EVENT MONITOR 09/18/2019  Narrative  Sinus bradycardia, normal sinus rhythm and sinus tachycardia. The average heart rate was 71bpm and ranged from 58-103bpm.  Nonsutained atrial tachycardia up to 5 beats in a row.  2 episodes of sinus pauses with 3.5 second pauses. Once occurred during sleep at 30bpm and the other one occurred in the evening around 8:37pm with heart rate 30bpm.   CT SCANS  CT CORONARY MORPH W/CTA COR W/SCORE 04/29/2021  Addendum 04/30/2021  9:15 AM ADDENDUM REPORT: 04/30/2021 09:13  ADDENDUM: OVER-READ INTERPRETATION  CT CHEST  The following report is an over-read performed by radiologist Dr. Lovie Chol [facility] on 04/30/2021. This over-read does not include interpretation of cardiac or coronary anatomy or pathology. The coronary calcium score and coronary angiography interpretation by the cardiologist is attached. Imaging of the chest is focused on cardiac structures and excludes much of the chest on CT.  COMPARISON:  April 11, 2020.  FINDINGS:  Cardiovascular: Please see dedicated report for cardiovascular details. There is aortic atherosclerosis  Mediastinum/Nodes: Visualized mediastinum without signs of adenopathy or evidence of acute process to the extent evaluated  Lungs/Pleura: Basilar atelectasis. No effusion. No consolidation. Moderate narrowing of airways, with slit like appearance of bronchus intermedius on the RIGHT. Mainstem bronchus  Upper Abdomen: Incidental imaging of upper abdominal contents without acute findings. Small hiatal hernia.  Musculoskeletal: No acute bone finding. No destructive bone process. Spinal degenerative changes.  IMPRESSION:  1. Question bronchomalacia. 2. Small hiatal hernia. 3. Aortic atherosclerosis.  Aortic  Atherosclerosis (ICD10-I70.0).   Electronically Signed By: Donzetta Kohut M.D. On: 04/30/2021 09:13  Narrative CLINICAL DATA:  61F with chest pain  EXAM: Cardiac/Coronary CTA  TECHNIQUE: The patient was scanned on a [employer].  FINDINGS: A 100 kV prospective scan was triggered in the descending thoracic aorta at 111 HU's. Axial non-contrast 3 mm slices were carried out through the heart. The data set was analyzed on a dedicated work station and scored using the Agatson method. Gantry rotation speed was 250 msecs and collimation was .6 mm. 0.8 mg of sl NTG was given. The 3D data set was reconstructed in 5% intervals of the 35-75% of the R-R cycle. Phases were analyzed on a dedicated work station using MPR, MIP and VRT modes. The patient received 80 cc of contrast.  Coronary Arteries:  Normal coronary origin.  Right dominance.  RCA is a large dominant artery that gives rise to PDA and PLA. There is noncalcified plaque in mid RCA causing 25-49% stenosis  Left main is a large artery that gives rise to LAD and LCX arteries. Calcified plaque in the distal left main causes 0-24% stenosis  LAD is a large vessel. Calcified plaque in proximal LAD causes 0-24% stenosis. Calcified plaque in mid LAD causes 0-24% stenosis.  LCX is a non-dominant artery that gives rise to one large OM1 branch. There is calcified plaque in ostial LCX causing 0-24% stenosis.  Other findings:  Left Ventricle: Mild enlargement  Left Atrium: Normal size  Pulmonary Veins: Normal configuration  Right Ventricle: Normal size  Right Atrium: Normal size  Cardiac valves: Mild MAC  Thoracic aorta: Normal size  Pulmonary Arteries: Normal size  Systemic Veins: Normal drainage  Pericardium: Normal thickness  IMPRESSION: 1. Coronary calcium score of 48. This was 52nd percentile for age and sex matched control.  2.  Normal coronary origin with right dominance.  3. Nonobstructive CAD.  Most significant lesion is noncalcified plaque in mid RCA causing mild (25-49%) stenosis  CAD-RADS 2. Mild non-obstructive CAD (25-49%). Consider non-atherosclerotic causes of chest pain. Consider preventive therapy and risk factor modification.  Electronically Signed: By: Epifanio Lesches M.D. On: 04/29/2021 16:53   CT SCANS  CT CORONARY MORPH W/CTA COR W/SCORE 09/13/2019  Addendum 09/13/2019  7:04 PM ADDENDUM REPORT: 09/13/2019 19:02  EXAM: Cardiac/Coronary  CT  TECHNIQUE: The patient was scanned on a [employer].  FINDINGS: A 120 kV prospective scan was triggered in the descending thoracic aorta at 111 HU's. Axial non-contrast 3 mm slices were carried out through the heart. The data set was analyzed on a dedicated work station and scored using the Agatson method. Gantry rotation speed was 250 msecs and collimation was .6 mm. No beta blockade and 0.8 mg of sl NTG was given. The 3D data set was reconstructed in 5% intervals of the 67-82 % of the R-R cycle. Diastolic phases were analyzed on a dedicated work station using MPR, MIP and VRT modes. The patient received 80 cc of contrast.  Aorta: Normal size. Scattered calcifications in the ascending and descending aorta. No dissection.  Aortic Valve:  Trileaflet.  No calcifications.  Mitral Valve: Mitral annular calcifications.  Coronary Arteries:  Normal coronary origin.  Right dominance.  RCA is a large dominant  artery that gives rise to PDA and PLVB. There is no plaque.  Left main is a large artery that gives rise to LAD and LCX arteries. There is minimal plaque in the distal LM at the bifurcation of the LAD and LCx arteries with associated stenosis of 0-24%.  LAD is a large vessel that gives rise to a large branching diagonal. There is minimal calcified plaque in the mid LAD and the takeoff of a large D1 as well as distal LAD with associated stenosis of 0-24%.  LCX is a non-dominant artery that  gives rise to one large OM1 branch. There is no plaque.  Other findings:  Normal pulmonary vein drainage into the left atrium.  Normal let atrial appendage without a thrombus.  Normal size of the pulmonary artery.  IMPRESSION: 1. Coronary calcium score of 26. This was 48th percentile for age and sex matched control.  2.  Normal coronary origin with right dominance.  3.  Minimal atherosclerosis.  CAD RADS 1.  4.  Consider non-atherosclerotic causes of chest pain.  Armanda Magic   Electronically Signed By: Armanda Magic On: 09/13/2019 19:02  Narrative EXAM: OVER-READ INTERPRETATION  CT CHEST  The following report is an over-read performed by radiologist Dr. Trudie Reed of [facility] on 09/13/2019. This over-read does not include interpretation of cardiac or coronary anatomy or pathology. The coronary calcium score/coronary CTA interpretation by the cardiologist is attached.  COMPARISON:  None.  FINDINGS: Aortic atherosclerosis. Within the visualized portions of the thorax there are no suspicious appearing pulmonary nodules or masses, there is no acute consolidative airspace disease, no pleural effusions, no pneumothorax and no lymphadenopathy. Visualized portions of the upper abdomen are unremarkable. There are no aggressive appearing lytic or blastic lesions noted in the visualized portions of the skeleton.  IMPRESSION: 1.  Aortic Atherosclerosis (ICD10-I70.0).  Electronically Signed: By: Trudie Reed M.D. On: 09/13/2019 13:30   CT SCANS  CT CORONARY MORPH W/CTA COR W/SCORE 10/28/2017  Addendum 10/28/2017  1:13 PM ADDENDUM REPORT: 10/28/2017 13:11  EXAM: OVER-READ INTERPRETATION  CT CHEST  The following report is an over-read performed by radiologist Dr. Schuyler Amor [facility] on 10/28/2017. This over-read does not include interpretation of cardiac or coronary anatomy or pathology. The coronary CTA  interpretation by the cardiologist is attached.  COMPARISON:  07/09/2011  FINDINGS: No pericardial effusion. No mediastinal or hilar adenopathy identified. The main pulmonary artery appears unremarkable. Small hiatal hernia noted.  No pleural effusion. Chronic subsegmental atelectasis versus scar noted in the right middle lobe.  No acute abnormality identified within the abdomen.  Spondylosis noted within the lower thoracic spine.  IMPRESSION: 1. No acute pulmonary abnormalities identified. 2. Small hiatal hernia. 3. Chronic scar versus subsegmental atelectasis within the right middle lobe.   Electronically Signed By: Signa Kell M.D. On: 10/28/2017 13:11  Narrative CLINICAL DATA:  Chest pain  EXAM: Cardiac CTA  MEDICATIONS: Sub lingual nitro. 4mg  x 2  TECHNIQUE: The patient was scanned on a Siemens 192 slice scanner. Gantry rotation speed was 250 msecs. Collimation was 0.6 mm. A 100 kV prospective scan was triggered in the ascending thoracic aorta at 35-75% of the R-R interval. Average HR during the scan was 60 bpm. The 3D data set was interpreted on a dedicated work station using MPR, MIP and VRT modes. A total of 80cc of contrast was used.  FINDINGS: Non-cardiac: See separate report from [facility].  Normal pulmonary vein drainage to the left atrium.  Calcium  Score: 6.5 Agatston units.  Coronary Arteries: Right dominant with no anomalies  LM: Small area of calcified plaque distal left main, no stenosis.  LAD system: No plaque or stenosis.  Circumflex system: No plaque or stenosis.  RCA system: No plaque or stenosis.  IMPRESSION: 1. Coronary artery calcium score 6.5 Agatston units, placing the patient in the 30th percentile for age and gender, suggesting low risk for future cardiac events.  2.  No obstructive CAD.  [employer] account executive  Electronically Signed: By: Marca Ancona M.D. On: 10/28/2017 12:33   CARDIAC MRI  MR CARDIAC  MORPHOLOGY W WO CONTRAST 01/03/2020  Narrative CLINICAL DATA:  82 year old female with history of hypertension, diastolic dysfunction, sleep apnea on CPAP, gastric ulcer, GERD, hyperlipidemia, neuropathy from diabetes, osteoarthritis, PVCs, previous mild pulmonary HTN, chronic kidney disease stage 3, mild coronary atherosclerosis, and .  EXAM: CARDIAC MRI  TECHNIQUE: The patient was scanned on a 1.5 Tesla GE magnet. A dedicated cardiac coil was used. Functional imaging was done using Fiesta sequences. 2,3, and 4 chamber views were done to assess for RWMA's. Modified Simpson's rule using a short axis stack was used to calculate an ejection fraction on a dedicated work [employer]. The patient received 8 cc of Gadavist. After 10 minutes inversion recovery sequences were used to assess for infiltration and scar tissue.  CONTRAST:  8 cc  of Gadavist  FINDINGS: 1. Normal left ventricular size with moderate basal septal hypertrophy and hyperdynamic systolic function (LVEF = 73%). There are no regional wall motion abnormalities and no late gadolinium enhancement in the left ventricular myocardium. Mildly elevated extracellular volume (ECV =32%).  LVEDD: 41 mm  LVESD: 21 mm  LVEDV: 73 mm  LVESV: 19 ml  SV: 53 ml  CO: 4.0 L/min  Myocardial mass: 75 g  2. Normal right ventricular size, thickness and systolic function (RVEF = 64%). There are no regional wall motion abnormalities.  3.  Normal right atrial size.  Mildly dilated left atrium (41 mm).  4. Normal size of the aortic root, ascending aorta and pulmonary artery.  5.  No significant valvular abnormalities.  6.  Normal pericardium.  No pericardial effusion.  IMPRESSION: 1. Normal left ventricular size with moderate basal septal hypertrophy and hyperdynamic systolic function (LVEF = 73%). There are no regional wall motion abnormalities and no late gadolinium enhancement in the left ventricular  myocardium. Mildly elevated extracellular volume (ECV =32%).  2. Normal right ventricular size, thickness and systolic function (RVEF = 64%). There are no regional wall motion abnormalities.  3.  Normal right atrial size.  Mildly dilated left atrium (41 mm).  4. Normal size of the aortic root, ascending aorta and pulmonary artery.  5.  No significant valvular abnormalities.  6.  Normal pericardium.  No pericardial effusion.  There is no evidence for cardiac amyloidosis, the findings are consistent with hypertensive heart disease.   Electronically Signed By: Tobias Alexander On: 01/03/2020 14:27          Laboratory Data:  High Sensitivity Troponin:   Recent Labs  Lab 07/22/22 1346  TROPONINIHS 9     Chemistry Recent Labs  Lab 07/22/22 1346  NA 137  K 3.6  CL 105  CO2 17*  GLUCOSE 255*  BUN 26*  CREATININE 1.40*  CALCIUM 9.4  MG 2.0  GFRNONAA 38*  ANIONGAP 15    No results for input(s): "PROT", "ALBUMIN", "AST", "ALT", "ALKPHOS", "BILITOT" in the last 168 hours. Lipids No results for input(s): "CHOL", "  TRIG", "HDL", "LABVLDL", "LDLCALC", "CHOLHDL" in the last 168 hours.  Hematology Recent Labs  Lab 07/22/22 1346  WBC 10.2  RBC 4.30  HGB 12.5  HCT 37.1  MCV 86.3  MCH 29.1  MCHC 33.7  RDW 14.3  PLT 178   Thyroid No results for input(s): "TSH", "FREET4" in the last 168 hours.  BNP Recent Labs  Lab 07/22/22 1346  BNP 104.2*    DDimer No results for input(s): "DDIMER" in the last 168 hours.   Radiology/Studies:  DG Chest Port 1 View  Result Date: 07/22/2022 CLINICAL DATA:  Syncope EXAM: PORTABLE CHEST 1 VIEW COMPARISON:  Previous studies including the examination of 04/28/2021 FINDINGS: Transverse diameter of heart is increased. Low lung volumes. Increased interstitial markings are seen in parahilar regions and lower lung fields. There is no focal pulmonary consolidation. There is no pleural effusion or pneumothorax. Degenerative changes are  noted in both shoulders. Surgical clips are seen in left chest wall. IMPRESSION: Increased interstitial markings are seen in both parahilar regions and lower lung fields suggesting interstitial edema or interstitial pneumonia. Part of this finding may be due to low lung volumes. Electronically Signed   By: Ernie Avena M.D.   On: 07/22/2022 14:34     Assessment and Plan:   Syncope  - Patient had steroid injections in both of her knees about 1 week ago. She reports having high blood sugars (up to 600) and has been feeling poorly since then  - Today, blood sugar 275 - She reports that she went to see Eldridge Dace speak, and she started to feel "bad". Reports feeling weak, fatigued, dizzy, and like her head was heavy. Sat down, but continued to feel poorly and briefly lost consciousness  - hsTn negative  - Recent Echocardiogram from 03/2022 showed EF 60-65%, no regional wall motion abnormalities, moderate asymmetric LVH. No LVOT obstruction noted on that study  - No arrhythmias noted on telemetry - Patient reports that she has been urinating frequently recently. Does not sound like she has been drinking more water to compensate  - Suspect that patient's syncope is secondary to elevated blood sugar. She also does have hyperdynamic LV function and likely has some degree of volume depletion/dehydration (creatinine up to 1.4). I do not anticipate any further inpatient cardiac workup at this time  - Consider outpatient monitor at discharge  - Hold lasix   HTN  - Continue amlodipine 5 mg daily, losartan 25 mg daily metoprolol tartrate 25 mg BID   HLD  - Lipid panel from 06/2022 showed LDL 46 - Continue home lipitor 10 mg daily   Nonobstructive CAD  - Coronary CTA from 04/2021 showed a coronary calcium score of 48, mild, non-obstructive CAD. Most significant lesion was noncalcified plaque in the mid RCA causing mild (25-49%) stenosis  - Continue lipitor, ASA   Risk Assessment/Risk Scores:      For questions or updates, please contact [facility] Please consult [email or web address] for contact info under    Signed, Jonita Albee, PA-C  07/22/2022 3:46 PM  Patient seen and examined with KJ PA-C.  Agree as above, with the following exceptions and changes as noted below.  82 year old female presents after a syncopal episode, history of syncope that has been worked up in the past with unremarkable cardiac monitors.  She was attending an event today and felt unwell throughout, when she sat down she had several episodes where her companion felt she lost consciousness.  No fall, no injury.  She  did have bowel cramping nausea and vomiting with lightheadedness prior to (prodrome).  Extensive prior cardiac testing suggest no significant coronary artery disease, MRI suggest hypertensive heart disease and hyperdynamic function, and most recent echocardiogram reviewed with Robet Leu, PA, EF 65 to 70% with septal hypertrophy and no LVOT obstruction.  Gen: NAD, CV: RRR, no murmurs, Lungs: clear, Abd: soft, Extrem: Warm, well perfused, no edema, Neuro/Psych: alert and oriented x 3, normal mood and affect. All available labs, radiology testing, previous records reviewed.  Patient had steroid injections approximately 1 week ago and since has had suboptimal blood sugar control.  In addition she consumes tea and lemonade frequently throughout the day, though she does try to stay hydrated.  We discussed that it is most likely that she had some dehydration and with the significant heat outside, it may have contributed to neurocardiogenic syncope.  Patient also did not feel that it was cardiac in origin.  First troponin negative.  No further cardiac testing required in hospital.  If patient remains in hospital on an internal medicine service, would continue to monitor on telemetry.  Fortunately EKG shows sinus rhythm and telemetry is normal at this time.  We will arrange follow-up in the  office.  Cardiology will sign off.  Parke Poisson, MD 07/22/22 4:59 PM

## 2022-07-22 NOTE — Discharge Instructions (Signed)
You were seen for passing out in the emergency department.   At home, please stay well-hydrated and take the antibiotics we have given you in case you have a pneumonia.    Check your MyChart online for the results of any tests that had not resulted by the time you left the emergency department.   Follow-up with your primary doctor in 2-3 days regarding your visit.    Return immediately to the emergency department if you experience any of the following: Recurrent fainting, chest pain, shortness of breath, or any other concerning symptoms.    Thank you for visiting our Emergency Department. It was a pleasure taking care of you today.

## 2022-07-22 NOTE — ED Notes (Signed)
Checked patient cbg it was 72 notified RN of blood sugar

## 2022-07-22 NOTE — ED Notes (Signed)
Pt able to ambulate down the hallway to the bathroom with no assistance at this time.

## 2022-07-22 NOTE — ED Provider Notes (Signed)
[place] EMERGENCY DEPARTMENT AT [facility] Provider Note   CSN: [id number] Arrival date & time: 07/22/22  1312     History  Chief Complaint  Patient presents with   Loss of Consciousness    Was at work today and had multiple syncope episodes while sitting. Ems arrived. Pts bgl 216, EMS pt did brady down into the 40s at one point and did have an episode of vomiting. Iv 4mg  zofran given pta. Pt reports not feeling well this morning. Pt reports dizziness and weakness    Nancy [name] is a 82 y.o. female.  44-year-old female with a history of syncope, hypertension, hyperlipidemia, stage III CKD, heart failure with preserved ejection fraction who presents to the emergency department with syncopal episode.  Over the past week has been feeling poorly after Nancy [name] received a steroid shot in her knee.  Says her blood sugars have been elevated and Nancy [name] has Been getting dizzy with standing and had frequent urinations.  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  Other members say that Nancy [name] started to come to when Nancy [name] passed out again.  Says that Nancy [name] felt dizzy but did not have any chest pain or shortness of breath prior to this.  Has passed out last year as well.  No personal history of MI or arrhythmia that Nancy [name] knows of otherwise.  No family history of sudden unexplained cardiac death or arrhythmia that Nancy [name] is aware of.  EMS did note that in route Nancy [name] had sinus bradycardia into the 40s.  Last echo was on 03/2022 that did show some diastolic dysfunction.       Home Medications Prior to Admission medications   Medication Sig Start Date End Date Taking? Authorizing Provider  doxycycline (VIBRAMYCIN) 100 MG capsule Take 1 capsule (100 mg total) by mouth 2 (two) times daily for 7 days. 07/22/22 07/29/22 Yes Rondel Baton, MD  allopurinol (ZYLOPRIM) 300 MG tablet Take 300 mg by mouth daily.  10/07/18   [provider]  amLODipine (NORVASC) 5 MG tablet Take 5 mg by  mouth daily. 10/29/19   [provider]  aspirin EC 81 MG tablet Take 81 mg by mouth daily.    [provider]  atorvastatin (LIPITOR) 10 MG tablet Take 1 tablet by mouth once daily 07/05/22   Turner, Cornelious Bryant, MD  Blood Glucose Monitoring Suppl (ONETOUCH VERIO IQ SYSTEM) w/Device KIT 1 kit by Does not apply route 2 (two) times daily. E11.65 08/24/18   Shamleffer, Konrad Dolores, MD  colchicine 0.6 MG tablet Take 0.6 mg by mouth daily.    [provider]  dapagliflozin propanediol (FARXIGA) 10 MG TABS tablet Take 1 tablet (10 mg total) by mouth daily before breakfast. 02/08/22   Shamleffer, Konrad Dolores, MD  famotidine (PEPCID) 20 MG tablet Take 20 mg by mouth 2 (two) times daily. 01/27/21   [provider]  furosemide (LASIX) 20 MG tablet Take 1 tablet (20 mg total) by mouth daily as needed (for weight gain of >3lbs in 1 day or >5lbs in 1 week). 04/29/21   Rolly Salter, MD  gabapentin (NEURONTIN) 300 MG capsule Take 600 mg by mouth 3 (three) times daily. 05/05/16   [provider]  glipiZIDE (GLUCOTROL) 5 MG tablet Take 1 tablet (5 mg total) by mouth 2 (two) times daily before a meal. 07/19/22   Shamleffer, Konrad Dolores, MD  glucose blood (CARESENS N GLUCOSE TEST) test strip 1 each by Other route  daily in the afternoon. Use as instructed 10/06/21   Shamleffer, Konrad Dolores, MD  losartan (COZAAR) 25 MG tablet Take 25 mg by mouth at bedtime. 09/15/21   [provider]  metFORMIN (GLUCOPHAGE) 1000 MG tablet Take 1 tablet (1,000 mg total) by mouth daily with breakfast. 02/08/22   Shamleffer, Konrad Dolores, MD  metoprolol tartrate (LOPRESSOR) 50 MG tablet Take 0.5 tablets (25 mg total) by mouth 2 (two) times daily. 11/13/19 02/08/22  Dunn, Tacey Ruiz, PA-C  Multiple Vitamins tablet Take 1 tablet by mouth daily. 07/13/19   [provider]  omeprazole (PRILOSEC) 20 MG capsule Take 20 mg by mouth daily as needed (acid reflux).    [provider]  Semaglutide (RYBELSUS) 7 MG TABS Take 1 tablet (7 mg total) by mouth daily. 02/08/22   Shamleffer, Konrad Dolores, MD  traMADol (ULTRAM) 50 MG tablet Take 50 mg by mouth every 6 (six) hours as needed for moderate pain or severe pain. 12/09/20   [provider]      Allergies    Ketorolac tromethamine, Morphine, Morphine sulfate, and Codeine    Review of Systems   Review of Systems  Physical Exam Updated Vital Signs BP (!) 152/66   Pulse 80   Temp 97.9 F (36.6 C) (Oral)   Resp 14   Ht 5\' 2"  (1.575 m)   Wt 80.3 kg   SpO2 100%   BMI 32.37 kg/m  Physical Exam Vitals and nursing note reviewed.  Constitutional:      General: Nancy [name] is not in acute distress.    Appearance: Nancy [name] is well-developed.  HENT:     Head: Normocephalic and atraumatic.     Right Ear: External ear normal.     Left Ear: External ear normal.     Nose: Nose normal.  Eyes:     Extraocular Movements: Extraocular movements intact.     Conjunctiva/sclera: Conjunctivae normal.     Pupils: Pupils are equal, round, and reactive to light.  Cardiovascular:     Rate and Rhythm: Normal rate and regular rhythm.     Heart sounds: No murmur heard. Pulmonary:     Effort: Pulmonary effort is normal. No respiratory distress.     Breath sounds: Normal breath sounds.  Musculoskeletal:     Cervical back: Normal range of motion and neck supple.     Right lower leg: No edema.     Left lower leg: No edema.  Skin:    General: Skin is warm and dry.  Neurological:     Mental Status: Nancy [name] is alert and oriented to person, place, and time. Mental status is at baseline.  Psychiatric:        Mood and Affect: Mood normal.     ED Results / Procedures / Treatments   Labs (all labs ordered are listed, but only abnormal results are displayed) Labs Reviewed  BASIC METABOLIC PANEL - Abnormal; Notable for the following components:      Result Value   CO2 17 (*)    Glucose, Bld 255 (*)    BUN 26 (*)    Creatinine, Ser  1.40 (*)    GFR, Estimated 38 (*)    All other components within normal limits  BRAIN NATRIURETIC PEPTIDE - Abnormal; Notable for the following components:   B Natriuretic Peptide 104.2 (*)    All other components within normal limits  CBG MONITORING, ED - Abnormal; Notable for the following components:   Glucose-Capillary 275 (*)    All  other components within normal limits  SARS CORONAVIRUS 2 BY RT PCR  CBC WITH DIFFERENTIAL/PLATELET  MAGNESIUM  URINALYSIS, ROUTINE W REFLEX MICROSCOPIC  TROPONIN I (HIGH SENSITIVITY)    EKG EKG Interpretation Date/Time:  Thursday July 22 2022 13:31:01 EDT Ventricular Rate:  70 PR Interval:  191 QRS Duration:  76 QT Interval:  420 QTC Calculation: 454 R Axis:   -1  Text Interpretation: Sinus rhythm Interpretation limited secondary to artifact Confirmed by Vonita Moss [phone]) on 07/22/2022 1:33:30 PM  Radiology DG Chest Port 1 View  Result Date: 07/22/2022 CLINICAL DATA:  Syncope EXAM: PORTABLE CHEST 1 VIEW COMPARISON:  Previous studies including the examination of 04/28/2021 FINDINGS: Transverse diameter of heart is increased. Low lung volumes. Increased interstitial markings are seen in parahilar regions and lower lung fields. There is no focal pulmonary consolidation. There is no pleural effusion or pneumothorax. Degenerative changes are noted in both shoulders. Surgical clips are seen in left chest wall. IMPRESSION: Increased interstitial markings are seen in both parahilar regions and lower lung fields suggesting interstitial edema or interstitial pneumonia. Part of this finding may be due to low lung volumes. Electronically Signed   By: Ernie Avena M.D.   On: 07/22/2022 14:34    Procedures Procedures    Medications Ordered in ED Medications  lactated ringers bolus 500 mL (0 mLs Intravenous Stopped 07/22/22 1510)  lactated ringers bolus 500 mL (0 mLs Intravenous Stopped 07/22/22 1638)  potassium chloride SA (KLOR-CON M) CR  tablet 40 mEq (40 mEq Oral Given 07/22/22 1522)    ED Course/ Medical Decision Making/ A&P Clinical Course as of 07/22/22 1739  Thu Jul 22, 2022  1447 DG Chest Port 1 View IMPRESSION: Increased interstitial markings are seen in both parahilar regions and lower lung fields suggesting interstitial edema or interstitial pneumonia. Part of this finding may be due to low lung volumes. [RP]  1653 Dr Loretha Brasil from cardiology has evaluated the patient.  Feels that the cause the patient's syncope is likely noncardiac.  Does not require any additional follow-up. [RP]    Clinical Course User Index [RP] Rondel Baton, MD                             Medical Decision Making Amount and/or Complexity of Data Reviewed Labs: ordered. Radiology: ordered. Decision-making details documented in ED Course.  Risk Prescription drug management.   ADALEENA MOOERS is a 82 y.o. female with comorbidities that complicate the patient evaluation including syncope, hypertension, hyperlipidemia, stage III CKD, heart failure with preserved ejection fraction who presents to the emergency department with syncopal episode.     Initial Ddx:  Symptomatic bradycardia, orthostasis, URI, pneumonia  MDM/Course:  82 year old female presents with 1 week of feeling generally unwell with elevated blood sugars and several syncopal episodes today.  On exam is overall well-appearing at this time.  Initially was concerned about possible orthostasis given her elevated blood sugars at home and frequent urination.  Labs did show that Nancy [name] has a mild AKI at this time.  Was given 2 500 mL boluses with her history of heart failure with preserved ejection fraction.  Chest x-ray did show possible atypical pneumonia versus pulmonary edema.  BNP today is not markedly elevated and Nancy [name] does not have any significant lower extremity edema.  Did go back and talk to the patient Nancy [name] is not having PND at all.  With her AKI feel that atypical  pneumonia may be  more likely because Nancy [name] is saying that Nancy [name] has a cough and has fatigue though Nancy [name] is afebrile with normal white count.  Troponin and EKG without any ischemic findings.  Upon re-evaluation patient remained stable on room air.  This was discussed with the patient Nancy [name] was given a prescription for doxycycline.  Did consult cardiology but they felt that is likely not a cardiac cause and the patient did not need an outpatient monitor or any other interventions.  Did shared decision-making with the patient regarding admission versus discharge with her declining renal function and possible pneumonia on her chest x-ray.  Nancy [name] stated that Nancy [name] preferred to go home at this time so we will send her home with a prescription of doxycycline and and to follow-up with her primary doctor early next week.  This patient presents to the ED for concern of complaints listed in HPI, this involves an extensive number of treatment options, and is a complaint that carries with it a high risk of complications and morbidity. Disposition including potential need for admission considered.   Dispo: DC Home. Return precautions discussed including, but not limited to, those listed in the AVS. Allowed pt time to ask questions which were answered fully prior to dc.  Additional history obtained from family Records reviewed Outpatient Clinic Notes The following labs were independently interpreted: Chemistry and show AKI I independently reviewed the following imaging with scope of interpretation limited to determining acute life threatening conditions related to emergency care: Chest x-ray and agree with the radiologist interpretation with the following exceptions: none I personally reviewed and interpreted cardiac monitoring: normal sinus rhythm  I personally reviewed and interpreted the pt's EKG: see above for interpretation  I have reviewed the patients home medications and made adjustments as needed Consults:  Cardiology Social Determinants of health:  Elderly         Final Clinical Impression(s) / ED Diagnoses Final diagnoses:  AKI (acute kidney injury) (HCC)  Syncope, unspecified syncope type  Atypical pneumonia    Rx / DC Orders ED Discharge Orders          Ordered    doxycycline (VIBRAMYCIN) 100 MG capsule  2 times daily        07/22/22 1714              Rondel Baton, MD 07/22/22 1739

## 2022-08-03 ENCOUNTER — Emergency Department (HOSPITAL_BASED_OUTPATIENT_CLINIC_OR_DEPARTMENT_OTHER): Payer: 59 | Admitting: Radiology

## 2022-08-03 ENCOUNTER — Inpatient Hospital Stay (HOSPITAL_BASED_OUTPATIENT_CLINIC_OR_DEPARTMENT_OTHER)
Admission: EM | Admit: 2022-08-03 | Discharge: 2022-08-07 | DRG: 872 | Disposition: A | Discharge status: Home / Self Care | Payer: 59 | Attending: Internal Medicine | Admitting: Internal Medicine

## 2022-08-03 ENCOUNTER — Other Ambulatory Visit: Payer: Self-pay

## 2022-08-03 ENCOUNTER — Encounter (HOSPITAL_BASED_OUTPATIENT_CLINIC_OR_DEPARTMENT_OTHER): Payer: Self-pay

## 2022-08-03 DIAGNOSIS — Z7982 Long term (current) use of aspirin: Secondary | ICD-10-CM | POA: Diagnosis not present

## 2022-08-03 DIAGNOSIS — Z20822 Contact with and (suspected) exposure to covid-19: Secondary | ICD-10-CM | POA: Diagnosis present

## 2022-08-03 DIAGNOSIS — E1142 Type 2 diabetes mellitus with diabetic polyneuropathy: Secondary | ICD-10-CM | POA: Diagnosis present

## 2022-08-03 DIAGNOSIS — Z8619 Personal history of other infectious and parasitic diseases: Secondary | ICD-10-CM

## 2022-08-03 DIAGNOSIS — K5900 Constipation, unspecified: Secondary | ICD-10-CM | POA: Diagnosis not present

## 2022-08-03 DIAGNOSIS — E785 Hyperlipidemia, unspecified: Secondary | ICD-10-CM | POA: Diagnosis not present

## 2022-08-03 DIAGNOSIS — Z79899 Other long term (current) drug therapy: Secondary | ICD-10-CM | POA: Diagnosis not present

## 2022-08-03 DIAGNOSIS — I700 Atherosclerosis of aorta: Secondary | ICD-10-CM | POA: Diagnosis not present

## 2022-08-03 DIAGNOSIS — Z8616 Personal history of COVID-19: Secondary | ICD-10-CM | POA: Diagnosis not present

## 2022-08-03 DIAGNOSIS — N1832 Chronic kidney disease, stage 3b: Secondary | ICD-10-CM | POA: Diagnosis present

## 2022-08-03 DIAGNOSIS — E876 Hypokalemia: Secondary | ICD-10-CM | POA: Diagnosis not present

## 2022-08-03 DIAGNOSIS — A419 Sepsis, unspecified organism: Secondary | ICD-10-CM | POA: Diagnosis present

## 2022-08-03 DIAGNOSIS — Z1152 Encounter for screening for COVID-19: Secondary | ICD-10-CM

## 2022-08-03 DIAGNOSIS — E1122 Type 2 diabetes mellitus with diabetic chronic kidney disease: Secondary | ICD-10-CM | POA: Diagnosis present

## 2022-08-03 DIAGNOSIS — M109 Gout, unspecified: Secondary | ICD-10-CM | POA: Diagnosis present

## 2022-08-03 DIAGNOSIS — I10 Essential (primary) hypertension: Secondary | ICD-10-CM | POA: Diagnosis not present

## 2022-08-03 DIAGNOSIS — A4151 Sepsis due to Escherichia coli [E. coli]: Principal | ICD-10-CM | POA: Diagnosis present

## 2022-08-03 DIAGNOSIS — K219 Gastro-esophageal reflux disease without esophagitis: Secondary | ICD-10-CM | POA: Diagnosis present

## 2022-08-03 DIAGNOSIS — I2510 Atherosclerotic heart disease of native coronary artery without angina pectoris: Secondary | ICD-10-CM | POA: Diagnosis not present

## 2022-08-03 DIAGNOSIS — Z1611 Resistance to penicillins: Secondary | ICD-10-CM | POA: Diagnosis present

## 2022-08-03 DIAGNOSIS — Z8249 Family history of ischemic heart disease and other diseases of the circulatory system: Secondary | ICD-10-CM | POA: Diagnosis not present

## 2022-08-03 DIAGNOSIS — G4733 Obstructive sleep apnea (adult) (pediatric): Secondary | ICD-10-CM

## 2022-08-03 DIAGNOSIS — Z7984 Long term (current) use of oral hypoglycemic drugs: Secondary | ICD-10-CM

## 2022-08-03 DIAGNOSIS — I1310 Hypertensive heart and chronic kidney disease without heart failure, with stage 1 through stage 4 chronic kidney disease, or unspecified chronic kidney disease: Secondary | ICD-10-CM | POA: Diagnosis present

## 2022-08-03 DIAGNOSIS — N390 Urinary tract infection, site not specified: Secondary | ICD-10-CM | POA: Diagnosis present

## 2022-08-03 DIAGNOSIS — I272 Pulmonary hypertension, unspecified: Secondary | ICD-10-CM | POA: Diagnosis not present

## 2022-08-03 LAB — BLOOD CULTURE ID PANEL (REFLEXED) - BCID2

## 2022-08-03 LAB — URINALYSIS, W/ REFLEX TO CULTURE (INFECTION SUSPECTED)
Bilirubin Urine: NEGATIVE
Glucose, UA: >=500 mg/dL — AB
Ketones, ur: NEGATIVE mg/dL
Nitrite: POSITIVE — AB
Protein, ur: NEGATIVE mg/dL
Specific Gravity, Urine: 1.01 (ref 1.005–1.030)
pH: 6 (ref 5.0–8.0)

## 2022-08-03 LAB — CBC WITH DIFFERENTIAL/PLATELET
Abs Immature Granulocytes: 0.03 10*3/uL (ref 0.00–0.07)
Basophils Absolute: 0 10*3/uL (ref 0.0–0.1)
Basophils Relative: 0 %
Eosinophils Absolute: 0.1 10*3/uL (ref 0.0–0.5)
Eosinophils Relative: 1 %
HCT: 35.1 % — ABNORMAL LOW (ref 36.0–46.0)
Hemoglobin: 11.6 g/dL — ABNORMAL LOW (ref 12.0–15.0)
Immature Granulocytes: 0 %
Lymphocytes Relative: 11 %
Lymphs Abs: 0.8 10*3/uL (ref 0.7–4.0)
MCH: 28.7 pg (ref 26.0–34.0)
MCHC: 33 g/dL (ref 30.0–36.0)
MCV: 86.9 fL (ref 80.0–100.0)
Monocytes Absolute: 0.2 10*3/uL (ref 0.1–1.0)
Monocytes Relative: 2 %
Neutro Abs: 6.7 10*3/uL (ref 1.7–7.7)
Neutrophils Relative %: 86 %
Platelets: 147 10*3/uL — ABNORMAL LOW (ref 150–400)
RBC: 4.04 MIL/uL (ref 3.87–5.11)
RDW: 15.4 % (ref 11.5–15.5)
WBC: 7.9 10*3/uL (ref 4.0–10.5)
nRBC: 0 % (ref 0.0–0.2)

## 2022-08-03 LAB — RESP PANEL BY RT-PCR (RSV, FLU A&B, COVID)  RVPGX2
Influenza A by PCR: NEGATIVE
Influenza B by PCR: NEGATIVE
Resp Syncytial Virus by PCR: NEGATIVE
SARS Coronavirus 2 by RT PCR: NEGATIVE

## 2022-08-03 LAB — LACTIC ACID, PLASMA
Lactic Acid, Venous: 2.1 mmol/L (ref 0.5–1.9)
Lactic Acid, Venous: 2.9 mmol/L (ref 0.5–1.9)
Lactic Acid, Venous: 3.1 mmol/L (ref 0.5–1.9)

## 2022-08-03 LAB — COMPREHENSIVE METABOLIC PANEL
ALT: 24 U/L (ref 0–44)
AST: 25 U/L (ref 15–41)
Albumin: 4 g/dL (ref 3.5–5.0)
Alkaline Phosphatase: 103 U/L (ref 38–126)
Anion gap: 11 (ref 5–15)
BUN: 12 mg/dL (ref 8–23)
CO2: 22 mmol/L (ref 22–32)
Calcium: 9.2 mg/dL (ref 8.9–10.3)
Chloride: 107 mmol/L (ref 98–111)
Creatinine, Ser: 0.91 mg/dL (ref 0.44–1.00)
GFR, Estimated: >60 mL/min (ref >60)
Glucose, Bld: 167 mg/dL — ABNORMAL HIGH (ref 70–99)
Potassium: 3.8 mmol/L (ref 3.5–5.1)
Sodium: 140 mmol/L (ref 135–145)
Total Bilirubin: 1.5 mg/dL — ABNORMAL HIGH (ref 0.3–1.2)
Total Protein: 7.6 g/dL (ref 6.5–8.1)

## 2022-08-03 LAB — GLUCOSE, CAPILLARY
Glucose-Capillary: 163 mg/dL — ABNORMAL HIGH (ref 70–99)
Glucose-Capillary: 204 mg/dL — ABNORMAL HIGH (ref 70–99)
Glucose-Capillary: 182 mg/dL — ABNORMAL HIGH (ref 70–99)

## 2022-08-03 LAB — PROTIME-INR
INR: 1.1 (ref 0.8–1.2)
Prothrombin Time: 14.1 seconds (ref 11.4–15.2)

## 2022-08-03 LAB — MRSA NEXT GEN BY PCR, NASAL: MRSA by PCR Next Gen: NOT DETECTED

## 2022-08-03 LAB — CBG MONITORING, ED: Glucose-Capillary: 149 mg/dL — ABNORMAL HIGH (ref 70–99)

## 2022-08-03 LAB — APTT: aPTT: 25 seconds (ref 24–36)

## 2022-08-03 MED ORDER — ALLOPURINOL 300 MG PO TABS
300.0000 mg | ORAL_TABLET | Freq: Every day | ORAL | Status: DC
  Administered 2022-08-04 – 2022-08-07 (×4): 300 mg via ORAL
  Filled 2022-08-03 (×4): qty 1

## 2022-08-03 MED ORDER — AMLODIPINE BESYLATE 5 MG PO TABS
5.0000 mg | ORAL_TABLET | Freq: Every day | ORAL | Status: DC
  Administered 2022-08-04 – 2022-08-07 (×4): 5 mg via ORAL
  Filled 2022-08-03 (×4): qty 1

## 2022-08-03 MED ORDER — TRAZODONE HCL 50 MG PO TABS
50.0000 mg | ORAL_TABLET | Freq: Every evening | ORAL | Status: DC | PRN
  Administered 2022-08-05 – 2022-08-06 (×2): 50 mg via ORAL
  Filled 2022-08-03 (×2): qty 1

## 2022-08-03 MED ORDER — SODIUM CHLORIDE 0.9 % IV SOLN
2.0000 g | Freq: Once | INTRAVENOUS | Status: AC
  Administered 2022-08-03: 2 g via INTRAVENOUS
  Filled 2022-08-03: qty 12.5

## 2022-08-03 MED ORDER — SODIUM CHLORIDE 0.9 % IV SOLN: INTRAVENOUS | Status: DC | PRN

## 2022-08-03 MED ORDER — LACTATED RINGERS IV BOLUS (SEPSIS)
500.0000 mL | Freq: Once | INTRAVENOUS | Status: AC
  Administered 2022-08-03: 500 mL via INTRAVENOUS

## 2022-08-03 MED ORDER — ACETAMINOPHEN 650 MG RE SUPP: 650.0000 mg | Freq: Four times a day (QID) | RECTAL | Status: DC | PRN

## 2022-08-03 MED ORDER — LACTATED RINGERS IV BOLUS (SEPSIS)
1000.0000 mL | Freq: Once | INTRAVENOUS | Status: AC
  Administered 2022-08-03: 1000 mL via INTRAVENOUS

## 2022-08-03 MED ORDER — INSULIN ASPART 100 UNIT/ML IJ SOLN
0.0000 [IU] | Freq: Every day | INTRAMUSCULAR | Status: DC
  Administered 2022-08-06: 2 [IU] via SUBCUTANEOUS

## 2022-08-03 MED ORDER — SODIUM CHLORIDE 0.9 % IV SOLN
2.0000 g | Freq: Two times a day (BID) | INTRAVENOUS | Status: DC
  Filled 2022-08-03: qty 12.5

## 2022-08-03 MED ORDER — VANCOMYCIN HCL 500 MG IV SOLR
500.0000 mg | Freq: Once | INTRAVENOUS | Status: AC
  Administered 2022-08-03: 500 mg via INTRAVENOUS

## 2022-08-03 MED ORDER — VANCOMYCIN HCL IN DEXTROSE 1-5 GM/200ML-% IV SOLN
1000.0000 mg | Freq: Once | INTRAVENOUS | Status: AC
  Administered 2022-08-03: 1000 mg via INTRAVENOUS
  Filled 2022-08-03: qty 200

## 2022-08-03 MED ORDER — ATORVASTATIN CALCIUM 10 MG PO TABS
10.0000 mg | ORAL_TABLET | Freq: Every day | ORAL | Status: DC
  Administered 2022-08-04 – 2022-08-07 (×4): 10 mg via ORAL
  Filled 2022-08-03 (×4): qty 1

## 2022-08-03 MED ORDER — ENOXAPARIN SODIUM 40 MG/0.4ML IJ SOSY
40.0000 mg | PREFILLED_SYRINGE | INTRAMUSCULAR | Status: DC
  Administered 2022-08-03 – 2022-08-06 (×4): 40 mg via SUBCUTANEOUS
  Filled 2022-08-03 (×4): qty 0.4

## 2022-08-03 MED ORDER — ONDANSETRON HCL 4 MG PO TABS: 4.0000 mg | ORAL_TABLET | Freq: Four times a day (QID) | ORAL | Status: DC | PRN

## 2022-08-03 MED ORDER — ACETAMINOPHEN 325 MG PO TABS
650.0000 mg | ORAL_TABLET | Freq: Once | ORAL | Status: AC
  Administered 2022-08-03: 650 mg via ORAL
  Filled 2022-08-03: qty 2

## 2022-08-03 MED ORDER — INSULIN ASPART 100 UNIT/ML IJ SOLN
0.0000 [IU] | Freq: Three times a day (TID) | INTRAMUSCULAR | Status: DC
  Administered 2022-08-04 – 2022-08-05 (×3): 1 [IU] via SUBCUTANEOUS
  Administered 2022-08-05: 2 [IU] via SUBCUTANEOUS
  Administered 2022-08-06: 1 [IU] via SUBCUTANEOUS
  Administered 2022-08-06: 3 [IU] via SUBCUTANEOUS

## 2022-08-03 MED ORDER — SENNOSIDES-DOCUSATE SODIUM 8.6-50 MG PO TABS: 1.0000 | ORAL_TABLET | Freq: Every evening | ORAL | Status: DC | PRN

## 2022-08-03 MED ORDER — LOSARTAN POTASSIUM 25 MG PO TABS
25.0000 mg | ORAL_TABLET | Freq: Every day | ORAL | Status: DC
  Administered 2022-08-03: 25 mg via ORAL
  Filled 2022-08-03: qty 1

## 2022-08-03 MED ORDER — METOPROLOL TARTRATE 25 MG PO TABS
25.0000 mg | ORAL_TABLET | Freq: Two times a day (BID) | ORAL | Status: DC
  Administered 2022-08-03 – 2022-08-04 (×2): 25 mg via ORAL
  Filled 2022-08-03 (×2): qty 1

## 2022-08-03 MED ORDER — METRONIDAZOLE 500 MG/100ML IV SOLN
500.0000 mg | Freq: Once | INTRAVENOUS | Status: AC
  Administered 2022-08-03: 500 mg via INTRAVENOUS
  Filled 2022-08-03: qty 100

## 2022-08-03 MED ORDER — LACTATED RINGERS IV SOLN: INTRAVENOUS | Status: DC

## 2022-08-03 MED ORDER — SODIUM CHLORIDE 0.9 % IV SOLN
2.0000 g | INTRAVENOUS | Status: DC
  Administered 2022-08-03 – 2022-08-06 (×3): 2 g via INTRAVENOUS
  Filled 2022-08-03 (×3): qty 20

## 2022-08-03 MED ORDER — VANCOMYCIN HCL 750 MG/150ML IV SOLN
750.0000 mg | INTRAVENOUS | Status: DC
  Filled 2022-08-03: qty 150

## 2022-08-03 MED ORDER — ACETAMINOPHEN 325 MG PO TABS: 650.0000 mg | ORAL_TABLET | Freq: Four times a day (QID) | ORAL | Status: DC | PRN

## 2022-08-03 MED ORDER — ONDANSETRON HCL 4 MG/2ML IJ SOLN: 4.0000 mg | Freq: Four times a day (QID) | INTRAMUSCULAR | Status: DC | PRN

## 2022-08-03 MED ORDER — ASPIRIN 81 MG PO TBEC
81.0000 mg | DELAYED_RELEASE_TABLET | Freq: Every day | ORAL | Status: DC
  Administered 2022-08-04 – 2022-08-07 (×4): 81 mg via ORAL
  Filled 2022-08-03 (×4): qty 1

## 2022-08-03 NOTE — ED Notes (Signed)
Kim with cl notified for transport

## 2022-08-03 NOTE — ED Provider Notes (Addendum)
[facility] AT [facility] Provider Note   CSN: [id number] Arrival date & time: 08/03/22  1012     History  Chief Complaint  Patient presents with   Abdominal Pain   Weakness    Nancy [name] is a 82 y.o. female.  Patient today started running high fevers with chills very fatigued.  Patient had COVID exposure over the weekend.  Patient seen at [facility] on July 11 was to be taking doxycycline sounds as if it was for questionable pneumonia.  Patient only took 3 days of it.  Primary care doctor switched antibiotics which she did not fill that.  Past medical worsening for type 2 diabetes hyperlipidemia coronary artery disease chronic kidney disease stage III diastolic dysfunction hypertension.  Patient is never smoked.  Patient last seen July 11 they did mention acute kidney injury at that time.       Home Medications Prior to Admission medications   Medication Sig Start Date End Date Taking? Authorizing Provider  traZODone (DESYREL) 50 MG tablet Take by mouth. 07/01/22  Yes [provider]  allopurinol (ZYLOPRIM) 300 MG tablet Take 300 mg by mouth daily.  10/07/18   [provider]  amLODipine (NORVASC) 5 MG tablet Take 5 mg by mouth daily. 10/29/19   [provider]  aspirin EC 81 MG tablet Take 81 mg by mouth daily.    [provider]  atorvastatin (LIPITOR) 10 MG tablet Take 1 tablet by mouth once daily 07/05/22   Turner, Cornelious Bryant, MD  Blood Glucose Monitoring Suppl (ONETOUCH VERIO IQ SYSTEM) w/Device KIT 1 kit by Does not apply route 2 (two) times daily. E11.65 08/24/18   Shamleffer, Konrad Dolores, MD  colchicine 0.6 MG tablet Take 0.6 mg by mouth daily.    [provider]  dapagliflozin propanediol (FARXIGA) 10 MG TABS tablet Take 1 tablet (10 mg total) by mouth daily before breakfast. 02/08/22   Shamleffer, Konrad Dolores, MD  famotidine (PEPCID) 20 MG tablet Take 20 mg by mouth 2 (two) times daily.  01/27/21   [provider]  furosemide (LASIX) 20 MG tablet Take 1 tablet (20 mg total) by mouth daily as needed (for weight gain of >3lbs in 1 day or >5lbs in 1 week). 04/29/21   Rolly Salter, MD  gabapentin (NEURONTIN) 300 MG capsule Take 600 mg by mouth 3 (three) times daily. 05/05/16   [provider]  glipiZIDE (GLUCOTROL) 5 MG tablet Take 1 tablet (5 mg total) by mouth 2 (two) times daily before a meal. 07/19/22   Shamleffer, Konrad Dolores, MD  glucose blood (CARESENS N GLUCOSE TEST) test strip 1 each by Other route daily in the afternoon. Use as instructed 10/06/21   Shamleffer, Konrad Dolores, MD  losartan (COZAAR) 25 MG tablet Take 25 mg by mouth at bedtime. 09/15/21   [provider]  metFORMIN (GLUCOPHAGE) 1000 MG tablet Take 1 tablet (1,000 mg total) by mouth daily with breakfast. 02/08/22   Shamleffer, Konrad Dolores, MD  metoprolol tartrate (LOPRESSOR) 50 MG tablet Take 0.5 tablets (25 mg total) by mouth 2 (two) times daily. 11/13/19 02/08/22  Dunn, Tacey Ruiz, PA-C  Multiple Vitamins tablet Take 1 tablet by mouth daily. 07/13/19   [provider]  omeprazole (PRILOSEC) 20 MG capsule Take 20 mg by mouth daily as needed (acid reflux).    [provider]  Semaglutide (RYBELSUS) 7 MG TABS Take 1 tablet (7 mg total) by mouth daily. 02/08/22   Shamleffer, Konrad Dolores,  MD  traMADol (ULTRAM) 50 MG tablet Take 50 mg by mouth every 6 (six) hours as needed for moderate pain or severe pain. 12/09/20   [provider]      Allergies    Ketorolac tromethamine, Morphine, Morphine sulfate, and Codeine    Review of Systems   Review of Systems  Constitutional:  Positive for chills, fatigue and fever.  HENT:  Negative for ear pain and sore throat.   Eyes:  Negative for pain and visual disturbance.  Respiratory:  Negative for cough and shortness of breath.   Cardiovascular:  Negative for chest pain and palpitations.  Gastrointestinal:  Positive for  abdominal pain and nausea. Negative for vomiting.  Genitourinary:  Negative for dysuria and hematuria.  Musculoskeletal:  Negative for arthralgias and back pain.  Skin:  Negative for color change and rash.  Neurological:  Negative for seizures and syncope.  All other systems reviewed and are negative.   Physical Exam Updated Vital Signs BP 133/82   Pulse 96   Temp 99.3 F (37.4 C) (Oral)   Resp (!) 21   Ht 1.575 m (5\' 2" )   Wt 77.1 kg   SpO2 99%   BMI 31.09 kg/m  Physical Exam Vitals and nursing note reviewed.  Constitutional:      General: She is not in acute distress.    Appearance: Normal appearance. She is well-developed. She is ill-appearing.  HENT:     Head: Normocephalic and atraumatic.     Mouth/Throat:     Mouth: Mucous membranes are moist.  Eyes:     Extraocular Movements: Extraocular movements intact.     Conjunctiva/sclera: Conjunctivae normal.     Pupils: Pupils are equal, round, and reactive to light.  Cardiovascular:     Rate and Rhythm: Regular rhythm. Tachycardia present.     Heart sounds: No murmur heard. Pulmonary:     Effort: Pulmonary effort is normal. No respiratory distress.     Breath sounds: Normal breath sounds. No wheezing.  Abdominal:     Palpations: Abdomen is soft.     Tenderness: There is no abdominal tenderness. There is no guarding.     Comments: Abdomen soft and nontender  Musculoskeletal:        General: No swelling.     Cervical back: Normal range of motion and neck supple.  Skin:    General: Skin is warm and dry.     Capillary Refill: Capillary refill takes less than 2 seconds.  Neurological:     General: No focal deficit present.     Mental Status: She is alert. Mental status is at baseline.  Psychiatric:        Mood and Affect: Mood normal.     ED Results / Procedures / Treatments   Labs (all labs ordered are listed, but only abnormal results are displayed) Labs Reviewed  COMPREHENSIVE METABOLIC PANEL - Abnormal;  Notable for the following components:      Result Value   Glucose, Bld 167 (*)    Total Bilirubin 1.5 (*)    All other components within normal limits  LACTIC ACID, PLASMA - Abnormal; Notable for the following components:   Lactic Acid, Venous 2.1 (*)    All other components within normal limits  CBC WITH DIFFERENTIAL/PLATELET - Abnormal; Notable for the following components:   Hemoglobin 11.6 (*)    HCT 35.1 (*)    Platelets 147 (*)    All other components within normal limits  URINALYSIS, W/ REFLEX TO  CULTURE (INFECTION SUSPECTED) - Abnormal; Notable for the following components:   Glucose, UA >=500 (*)    Hgb urine dipstick TRACE (*)    Nitrite POSITIVE (*)    Leukocytes,Ua SMALL (*)    Bacteria, UA MANY (*)    All other components within normal limits  CBG MONITORING, ED - Abnormal; Notable for the following components:   Glucose-Capillary 149 (*)    All other components within normal limits  RESP PANEL BY RT-PCR (RSV, FLU A&B, COVID)  RVPGX2  CULTURE, BLOOD (ROUTINE X 2)  CULTURE, BLOOD (ROUTINE X 2)  PROTIME-INR  APTT  LACTIC ACID, PLASMA    EKG EKG Interpretation Date/Time:  Tuesday August 03 2022 10:26:12 EDT Ventricular Rate:  108 PR Interval:  198 QRS Duration:  58 QT Interval:  308 QTC Calculation: 412 R Axis:   -14  Text Interpretation: Sinus tachycardia Anterior infarct , age undetermined Abnormal ECG When compared with ECG of 22-Jul-2022 13:31, PREVIOUS ECG IS PRESENT Confirmed by Vanetta Mulders [phone]) on 08/03/2022 10:34:06 AM  Radiology DG Chest Port 1 View  Result Date: 08/03/2022 CLINICAL DATA:  Weakness EXAM: PORTABLE CHEST 1 VIEW COMPARISON:  X-ray 07/22/2022 FINDINGS: Calcified and tortuous aorta. No consolidation, pneumothorax or effusion. No edema. Overlapping cardiac leads. Degenerative changes of the spine. Surgical clips in the left axillary region. IMPRESSION: No acute cardiopulmonary disease. Electronically Signed   By: Karen Kays M.D.   On:  08/03/2022 12:17    Procedures Procedures    Medications Ordered in ED Medications  lactated ringers infusion ( Intravenous New Bag/Given 08/03/22 1308)  vancomycin (VANCOCIN) IVPB 1000 mg/200 mL premix (1,000 mg Intravenous New Bag/Given 08/03/22 1311)  vancomycin (VANCOCIN) 500 mg in sodium chloride 0.9 % 100 mL IVPB (has no administration in time range)  0.9 %  sodium chloride infusion ( Intravenous New Bag/Given 08/03/22 1116)  lactated ringers bolus 1,000 mL (0 mLs Intravenous Stopped 08/03/22 1203)    And  lactated ringers bolus 1,000 mL (0 mLs Intravenous Stopped 08/03/22 1300)    And  lactated ringers bolus 500 mL (0 mLs Intravenous Stopped 08/03/22 1306)  ceFEPIme (MAXIPIME) 2 g in sodium chloride 0.9 % 100 mL IVPB (0 g Intravenous Stopped 08/03/22 1155)  metroNIDAZOLE (FLAGYL) IVPB 500 mg (0 mg Intravenous Stopped 08/03/22 1257)    ED Course/ Medical Decision Making/ A&P                             Medical Decision Making Amount and/or Complexity of Data Reviewed Labs: ordered. Radiology: ordered.  Risk Prescription drug management. Decision regarding hospitalization.    CRITICAL CARE Performed by: Vanetta Mulders Total critical care time: 45 minutes Critical care time was exclusive of separately billable procedures and treating other patients. Critical care was necessary to treat or prevent imminent or life-threatening deterioration. Critical care was time spent personally by me on the following activities: development of treatment plan with patient and/or surrogate as well as nursing, discussions with consultants, evaluation of patient's response to treatment, examination of patient, obtaining history from patient or surrogate, ordering and performing treatments and interventions, ordering and review of laboratory studies, ordering and review of radiographic studies, pulse oximetry and re-evaluation of patient's condition.   Patient was supposed to be on doxycycline  after visit on July 11.  Only took 3 days of that.  Her primary care doctor put her on a different antibiotic but she never took that.  there was  some concerns about pneumonia.  Patient here with high fever tachycardia but blood pressure normal to high.  Somewhat meeting sepsis criteria but she had a COVID exposure over the weekend today just started with the fevers and very fatigued.  This may very well be secondary to COVID infection.  Order sepsis orders but will hold off on the antibiotics and full fluid challenge for right now.  Correction code sepsis activated in triage.  To include fluids antibiotics.  Patient's urinalysis seems to be consistent with urinary tract infection.  This most likely the source of infection.  Urine culture sent.  Lactic acid was elevated white count was normal.  Patient treated with broad-spectrum antibiotics.  Her COVID and respiratory panel were negative.  Chest x-ray was also negative for any acute findings.  Patient doing much better.  Never been hypotensive.  Discussed with hospitalist they will admit.  Suspect the urine is the source of the fever and infection.   Final Clinical Impression(s) / ED Diagnoses Final diagnoses:  Sepsis, due to unspecified organism, unspecified whether acute organ dysfunction present [facility]    Rx / DC Orders ED Discharge Orders     None         Vanetta Mulders, MD 08/03/22 1101    Vanetta Mulders, MD 08/03/22 1111    Vanetta Mulders, MD 08/03/22 1321

## 2022-08-03 NOTE — Progress Notes (Signed)
   08/03/22 2226  BiPAP/CPAP/SIPAP  BiPAP/CPAP/SIPAP Pt Type Adult (Patient refused CPAP qhs.  Patient states that she only wears her CPAP occassionaly at home and does not want to wear it tonight.  Patient encouraged to contact RT should she change her mind.)  Reason BIPAP/CPAP not in use Non-compliant

## 2022-08-03 NOTE — ED Notes (Signed)
Pt placed on Purewick due to patient's unsteadiness on feet, and inability to ambulate to bathroom. Pt was initially 2x assist to transfer from wheelchair to stretcher.

## 2022-08-03 NOTE — Progress Notes (Signed)
PHARMACY - PHYSICIAN COMMUNICATION CRITICAL VALUE ALERT - BLOOD CULTURE IDENTIFICATION (BCID)  Nancy [name] is an 82 y.o. female who presented to [facility] on 08/03/2022 with a chief complaint of  Chills, lower abdominal pain, nausea, fatigue   Assessment:  4/4 GNR, Ecoli, no Resistance  Name of physician (or Provider) Contacted:  K Foust  Current antibiotics: CTX 2gm  Changes to prescribed antibiotics recommended:  Patient is on recommended antibiotics - No changes needed  Results for orders placed or performed during the hospital encounter of 08/03/22  Blood Culture ID Panel (Reflexed) (Collected: 08/03/2022 10:49 AM)  Result Value Ref Range   Enterococcus faecalis NOT DETECTED NOT DETECTED   Enterococcus Faecium NOT DETECTED NOT DETECTED   Listeria monocytogenes NOT DETECTED NOT DETECTED   Staphylococcus species NOT DETECTED NOT DETECTED   Staphylococcus aureus (BCID) NOT DETECTED NOT DETECTED   Staphylococcus epidermidis NOT DETECTED NOT DETECTED   Staphylococcus lugdunensis NOT DETECTED NOT DETECTED   Streptococcus species NOT DETECTED NOT DETECTED   Streptococcus agalactiae NOT DETECTED NOT DETECTED   Streptococcus pneumoniae NOT DETECTED NOT DETECTED   Streptococcus pyogenes NOT DETECTED NOT DETECTED   A.calcoaceticus-baumannii NOT DETECTED NOT DETECTED   Bacteroides fragilis NOT DETECTED NOT DETECTED   Enterobacterales DETECTED (A) NOT DETECTED   Enterobacter cloacae complex NOT DETECTED NOT DETECTED   Escherichia coli DETECTED (A) NOT DETECTED   Klebsiella aerogenes NOT DETECTED NOT DETECTED   Klebsiella oxytoca NOT DETECTED NOT DETECTED   Klebsiella pneumoniae NOT DETECTED NOT DETECTED   Proteus species NOT DETECTED NOT DETECTED   Salmonella species NOT DETECTED NOT DETECTED   Serratia marcescens NOT DETECTED NOT DETECTED   Haemophilus influenzae NOT DETECTED NOT DETECTED   Neisseria meningitidis NOT DETECTED NOT DETECTED   Pseudomonas aeruginosa NOT DETECTED  NOT DETECTED   Stenotrophomonas maltophilia NOT DETECTED NOT DETECTED   Candida albicans NOT DETECTED NOT DETECTED   Candida auris NOT DETECTED NOT DETECTED   Candida glabrata NOT DETECTED NOT DETECTED   Candida krusei NOT DETECTED NOT DETECTED   Candida parapsilosis NOT DETECTED NOT DETECTED   Candida tropicalis NOT DETECTED NOT DETECTED   Cryptococcus neoformans/gattii NOT DETECTED NOT DETECTED   CTX-M ESBL NOT DETECTED NOT DETECTED   Carbapenem resistance IMP NOT DETECTED NOT DETECTED   Carbapenem resistance KPC NOT DETECTED NOT DETECTED   Carbapenem resistance NDM NOT DETECTED NOT DETECTED   Carbapenem resist OXA 48 LIKE NOT DETECTED NOT DETECTED   Carbapenem resistance VIM NOT DETECTED NOT DETECTED    Arley Phenix RPh 08/03/2022, 11:33 PM

## 2022-08-03 NOTE — ED Notes (Signed)
Attempted to call report x2. Will attempt again.

## 2022-08-03 NOTE — Progress Notes (Signed)
Pharmacy Antibiotic Note  Nancy [name] is a 82 y.o. female admitted on 08/03/2022 with sepsis.  Pharmacy has been consulted for Vancomycin and Cefepime dosing.  Flagyl(Metronidazole) 500mg  x1, Cefepime 2g x1  WBC: 7.,Temp: 99.3, sCr: 0.91  Plan: Vancomycin 1500 units IV x1, then,  Vancomycin 750 units IV every 24 hours.  Goal trough 15-20 mcg/mL. eAUC~529 Cefepime 2g IV q12h  Goal AUC: 400-500 Check levels at steady state Monitor daily CBC, temp, SCr, and for clinical signs of improvement  F/u cultures and de-escalate antibiotics as able  Height: 5\' 2"  (157.5 cm) Weight: 77.1 kg (170 lb) IBW/kg (Calculated) : 50.1  Temp (24hrs), Avg:101.3 F (38.5 C), Min:101.3 F (38.5 C), Max:101.3 F (38.5 C)  Recent Labs  Lab 08/03/22 1052  WBC 7.9  CREATININE 0.91  LATICACIDVEN 2.1*    Estimated Creatinine Clearance: 47.4 mL/min (by C-G formula based on SCr of 0.91 mg/dL).    Allergies  Allergen Reactions   Ketorolac Tromethamine Swelling    10/02/15 - given Toradol + morphine and RN noted tongue swelling - given Epi + Benadryl Has taken ibuprofen in past w/o problem Other reaction(s): Unknown   Morphine Swelling    Face and tongue swelling   Morphine Sulfate     Other reaction(s): Unknown   Codeine Other (See Comments)    headache Other reaction(s): Unknown    Antimicrobials this admission: Flagyl 07/23 x1  Vancomycin 07/23 >>  Cefepime 07/23 >>  Dose adjustments this admission: N/a  Microbiology results: 07/23 BCx: sent  Thank you for allowing pharmacy to be a part of this patient's care.  Caprice Beaver PharmD Candidate 2025 08/03/2022 12:59 PM

## 2022-08-03 NOTE — H&P (Signed)
History and Physical    Nancy [name] CBJ:[id number] DOB: [birth year] DOA: 08/03/2022  PCP: Hillery Aldo, NP   Patient coming from: Home   Chief Complaint: Chills, lower abdominal pain, nausea, fatigue   HPI: Nancy [name] is a pleasant 82 y.o. female with medical history significant for hypertension, type 2 diabetes mellitus, CAD, and OSA who presents to the ED with fever, chills, lower abdominal pain, nausea, and fatigue.  Patient had similar symptoms almost 2 weeks ago which had resolved until over the past day.  She denies any cough, rhinorrhea, sore throat, or shortness of breath.  There is no vomiting or diarrhea.  [facility] ED Course: Upon arrival to the ED, patient is found to be febrile to 39.1 C and mildly tachycardic.  Labs are most notable for normal WBC, negative respiratory virus panel, and lactic acid 2.1, increasing to 3.1.  Blood just were collected in the ED and the patient was given 2.5 L of LR, vancomycin, cefepime, Flagyl, and acetaminophen.  She was transferred to [facility] for admission.  Review of Systems:  All other systems reviewed and apart from HPI, are negative.  Past Medical History:  Diagnosis Date   Allergy    Anemia    Aortic atherosclerosis (HCC)    Atrial tachycardia    Breast cyst    left breast   CAD (coronary artery disease), native coronary artery    minimal ASCAD to the distal LM and mid LAD with 0-24% stenosis and Calcium score of 26.   Chest pain    CKD (chronic kidney disease), stage III (HCC)    Complication of anesthesia 1986   slow to awaken after cholecystectomy   Diastolic dysfunction    with mild pulmonary HTN by echo 07/2011   Gastric ulcer    GERD (gastroesophageal reflux disease)    Gout    Hyperlipidemia    Hypertension    Hypertensive heart disease    Neuropathy    from diabetes per patient   Osteoarthritis    Osteopenia    Shortness of breath dyspnea    with activity   Sinus pause     Spine deformity    lower spine   Type II diabetes mellitus (HCC)     Past Surgical History:  Procedure Laterality Date   ABDOMINAL ADHESION SURGERY     ABDOMINAL HYSTERECTOMY     ANUS SURGERY     for torn tissue   APPENDECTOMY     BREAST BIOPSY     BREAST LUMPECTOMY     left   CHOLECYSTECTOMY  1986   COLONOSCOPY     ESOPHAGOGASTRODUODENOSCOPY (EGD) WITH PROPOFOL N/A 07/18/2013   Procedure: ESOPHAGOGASTRODUODENOSCOPY (EGD) WITH PROPOFOL;  Surgeon: Willis Modena, MD;  Location: [facility];  Service: Endoscopy;  Laterality: N/A;   EUS N/A 07/18/2013   Procedure: ESOPHAGEAL ENDOSCOPIC ULTRASOUND (EUS) RADIAL;  Surgeon: Willis Modena, MD;  Location: [facility];  Service: Endoscopy;  Laterality: N/A;   IR GENERIC HISTORICAL  10/30/2015   IR US GUIDE VASC ACCESS RIGHT 10/30/2015 Barnetta Chapel, PA-C [facility]   IR GENERIC HISTORICAL  10/30/2015   IR FLUORO GUIDE CV LINE RIGHT 10/30/2015 Barnetta Chapel, PA-C [facility]   LUMBAR WOUND DEBRIDEMENT N/A 10/01/2015   Procedure: LUMBAR WOUND DEBRIDEMENT;  Surgeon: Venita Lick, MD;  Location: [facility];  Service: Orthopedics;  Laterality: N/A;   LUMBAR WOUND DEBRIDEMENT N/A 10/15/2015   Procedure: [facility] OUT AND CLOSURE OF BACK WOUND;  Surgeon: Venita Lick, MD;  Location: [facility];  Service: Orthopedics;  Laterality: N/A;   MASS EXCISION Left 07/24/2015   Procedure: EXCISION MASS LEFT LONG FINGER;  Surgeon: Betha Loa, MD;  Location: [facility];  Service: Orthopedics;  Laterality: Left;   SPINAL FUSION N/A 09/17/2015   Procedure: FUSION POSTERIOR SPINAL MULTILEVEL L4- S1;  Surgeon: Venita Lick, MD;  Location: [facility];  Service: Orthopedics;  Laterality: N/A;   TRANSFORAMINAL LUMBAR INTERBODY FUSION (TLIF) WITH PEDICLE SCREW FIXATION 1 LEVEL N/A 09/17/2015   Procedure: TRANSFORAMINAL LUMBAR INTERBODY FUSION (TLIF) WITH PEDICLE SCREW FIXATION 1 LEVEL , Lumbar 4-5;  Surgeon: Venita Lick, MD;  Location: [facility];  Service: Orthopedics;   Laterality: N/A;    Social History:   reports that she has never smoked. She has never used smokeless tobacco. She reports that she does not drink alcohol and does not use drugs.  Allergies  Allergen Reactions   Ketorolac Tromethamine Swelling    10/02/15 - given Toradol + morphine and RN noted tongue swelling - given Epi + Benadryl Has taken ibuprofen in past w/o problem Other reaction(s): Unknown   Morphine Swelling    Face and tongue swelling   Morphine Sulfate     Other reaction(s): Unknown   Codeine Other (See Comments)    headache Other reaction(s): Unknown    Family History  Problem Relation Age of Onset   Heart disease Mother    Cancer Sister        ovarian     Prior to Admission medications   Medication Sig Start Date End Date Taking? Authorizing Provider  allopurinol (ZYLOPRIM) 300 MG tablet Take 300 mg by mouth daily.  10/07/18  Yes [provider]  amLODipine (NORVASC) 5 MG tablet Take 5 mg by mouth daily. 10/29/19  Yes [provider]  aspirin EC 81 MG tablet Take 81 mg by mouth daily.   Yes [provider]  atorvastatin (LIPITOR) 10 MG tablet Take 1 tablet by mouth once daily 07/05/22  Yes Turner, Traci R, MD  colchicine 0.6 MG tablet Take 0.6 mg by mouth daily.   Yes [provider]  dapagliflozin propanediol (FARXIGA) 10 MG TABS tablet Take 1 tablet (10 mg total) by mouth daily before breakfast. 02/08/22  Yes Shamleffer, Konrad Dolores, MD  gabapentin (NEURONTIN) 300 MG capsule Take 600 mg by mouth 3 (three) times daily. 05/05/16  Yes [provider]  glipiZIDE (GLUCOTROL) 5 MG tablet Take 1 tablet (5 mg total) by mouth 2 (two) times daily before a meal. 07/19/22  Yes Shamleffer, Konrad Dolores, MD  losartan (COZAAR) 25 MG tablet Take 25 mg by mouth at bedtime. 09/15/21  Yes [provider]  metFORMIN (GLUCOPHAGE) 1000 MG tablet Take 1 tablet (1,000 mg total) by mouth daily with breakfast. 02/08/22  Yes Shamleffer,  Konrad Dolores, MD  metoprolol tartrate (LOPRESSOR) 50 MG tablet Take 0.5 tablets (25 mg total) by mouth 2 (two) times daily. 11/13/19 08/03/22 Yes Dunn, Dayna N, PA-C  Multiple Vitamins tablet Take 1 tablet by mouth daily. 07/13/19  Yes [provider]  omeprazole (PRILOSEC) 20 MG capsule Take 20 mg by mouth daily as needed (acid reflux).   Yes [provider]  Semaglutide (RYBELSUS) 7 MG TABS Take 1 tablet (7 mg total) by mouth daily. 02/08/22  Yes Shamleffer, Konrad Dolores, MD  traZODone (DESYREL) 50 MG tablet Take by mouth. 07/01/22  Yes [provider]  Blood Glucose Monitoring Suppl (ONETOUCH VERIO IQ SYSTEM) w/Device KIT 1 kit by Does not  apply route 2 (two) times daily. E11.65 08/24/18   Shamleffer, Konrad Dolores, MD  furosemide (LASIX) 20 MG tablet Take 1 tablet (20 mg total) by mouth daily as needed (for weight gain of >3lbs in 1 day or >5lbs in 1 week). Patient not taking: Reported on 08/03/2022 04/29/21   Rolly Salter, MD  glucose blood (CARESENS N GLUCOSE TEST) test strip 1 each by Other route daily in the afternoon. Use as instructed 10/06/21   Shamleffer, Konrad Dolores, MD    Physical Exam: Vitals:   08/03/22 1515 08/03/22 1530 08/03/22 1553 08/03/22 1656  BP:  (!) 149/60  (!) 117/55  Pulse:  (!) 101  86  Resp:  (!) 24  16  Temp: (!) 102.4 F (39.1 C)  (!) 101.5 F (38.6 C) 99.1 F (37.3 C)  TempSrc: Axillary  Axillary   SpO2:  98%  97%  Weight:    80.3 kg  Height:    5\' 2"  (1.575 m)     Constitutional: NAD, calm  Eyes: PERTLA, lids and conjunctivae normal ENMT: Mucous membranes are moist. Posterior pharynx clear of any exudate or lesions.   Neck: supple, no masses  Respiratory: no wheezing, no crackles. No accessory muscle use.  Cardiovascular: S1 & S2 heard, regular rate and rhythm. No extremity edema.   Abdomen: No distension, soft, suprapubic tenderness. Bowel sounds active.  Musculoskeletal: no clubbing / cyanosis. No joint deformity  upper and lower extremities.   Skin: no significant rashes, lesions, ulcers. Warm, dry, well-perfused. Neurologic: CN 2-12 grossly intact. Moving all extremities. Alert and oriented.  Psychiatric: Pleasant. Cooperative.    Labs and Imaging on Admission: I have personally reviewed following labs and imaging studies  CBC: Recent Labs  Lab 08/03/22 1052  WBC 7.9  NEUTROABS 6.7  HGB 11.6*  HCT 35.1*  MCV 86.9  PLT 147*   Basic Metabolic Panel: Recent Labs  Lab 08/03/22 1052  NA 140  K 3.8  CL 107  CO2 22  GLUCOSE 167*  BUN 12  CREATININE 0.91  CALCIUM 9.2   GFR: Estimated Creatinine Clearance: 48.4 mL/min (by C-G formula based on SCr of 0.91 mg/dL). Liver Function Tests: Recent Labs  Lab 08/03/22 1052  AST 25  ALT 24  ALKPHOS 103  BILITOT 1.5*  PROT 7.6  ALBUMIN 4.0   No results for input(s): "LIPASE", "AMYLASE" in the last 168 hours. No results for input(s): "AMMONIA" in the last 168 hours. Coagulation Profile: Recent Labs  Lab 08/03/22 1052  INR 1.1   Cardiac Enzymes: No results for input(s): "CKTOTAL", "CKMB", "CKMBINDEX", "TROPONINI" in the last 168 hours. BNP (last 3 results) No results for input(s): "PROBNP" in the last 8760 hours. HbA1C: No results for input(s): "HGBA1C" in the last 72 hours. CBG: Recent Labs  Lab 08/03/22 1029 08/03/22 1707  GLUCAP 149* 163*   Lipid Profile: No results for input(s): "CHOL", "HDL", "LDLCALC", "TRIG", "CHOLHDL", "LDLDIRECT" in the last 72 hours. Thyroid Function Tests: No results for input(s): "TSH", "T4TOTAL", "FREET4", "T3FREE", "THYROIDAB" in the last 72 hours. Anemia Panel: No results for input(s): "VITAMINB12", "FOLATE", "FERRITIN", "TIBC", "IRON", "RETICCTPCT" in the last 72 hours. Urine analysis:    Component Value Date/Time   COLORURINE YELLOW 08/03/2022 1052   APPEARANCEUR CLEAR 08/03/2022 1052   LABSPEC 1.010 08/03/2022 1052   PHURINE 6.0 08/03/2022 1052   GLUCOSEU >=500 (A) 08/03/2022 1052    HGBUR TRACE (A) 08/03/2022 1052   BILIRUBINUR NEGATIVE 08/03/2022 1052   KETONESUR NEGATIVE 08/03/2022 1052  PROTEINUR NEGATIVE 08/03/2022 1052   UROBILINOGEN 1.0 12/10/2006 9604   NITRITE POSITIVE (A) 08/03/2022 1052   LEUKOCYTESUR SMALL (A) 08/03/2022 1052   Sepsis Labs: @LABRCNTIP (procalcitonin:4,lacticidven:4) ) Recent Results (from the past 240 hour(s))  Resp panel by RT-PCR (RSV, Flu A&B, Covid) Anterior Nasal Swab     Status: None   Collection Time: 08/03/22 10:31 AM   Specimen: Anterior Nasal Swab  Result Value Ref Range Status   SARS Coronavirus 2 by RT PCR NEGATIVE NEGATIVE Final    Comment: (NOTE) SARS-CoV-2 target nucleic acids are NOT DETECTED.  The SARS-CoV-2 RNA is generally detectable in upper respiratory specimens during the acute phase of infection. The lowest concentration of SARS-CoV-2 viral copies this assay can detect is 138 copies/mL. A negative result does not preclude SARS-Cov-2 infection and should not be used as the sole basis for treatment or other patient management decisions. A negative result may occur with  improper specimen collection/handling, submission of specimen other than nasopharyngeal swab, presence of viral mutation(s) within the areas targeted by this assay, and inadequate number of viral copies(<138 copies/mL). A negative result must be combined with clinical observations, patient history, and epidemiological information. The expected result is Negative.  Fact Sheet for Patients:  BloggerCourse.com  Fact Sheet for Healthcare Providers:  SeriousBroker.it  This test is no t yet approved or cleared by the [place] FDA and  has been authorized for detection and/or diagnosis of SARS-CoV-2 by FDA under an Emergency Use Authorization (EUA). This EUA will remain  in effect (meaning this test can be used) for the duration of the COVID-19 declaration under Section 564(b)(1) of the Act,  21 U.S.C.section 360bbb-3(b)(1), unless the authorization is terminated  or revoked sooner.       Influenza A by PCR NEGATIVE NEGATIVE Final   Influenza B by PCR NEGATIVE NEGATIVE Final    Comment: (NOTE) The Xpert Xpress SARS-CoV-2/FLU/RSV plus assay is intended as an aid in the diagnosis of influenza from Nasopharyngeal swab specimens and should not be used as a sole basis for treatment. Nasal washings and aspirates are unacceptable for Xpert Xpress SARS-CoV-2/FLU/RSV testing.  Fact Sheet for Patients: BloggerCourse.com  Fact Sheet for Healthcare Providers: SeriousBroker.it  This test is not yet approved or cleared by the [place] FDA and has been authorized for detection and/or diagnosis of SARS-CoV-2 by FDA under an Emergency Use Authorization (EUA). This EUA will remain in effect (meaning this test can be used) for the duration of the COVID-19 declaration under Section 564(b)(1) of the Act, 21 U.S.C. section 360bbb-3(b)(1), unless the authorization is terminated or revoked.     Resp Syncytial Virus by PCR NEGATIVE NEGATIVE Final    Comment: (NOTE) Fact Sheet for Patients: BloggerCourse.com  Fact Sheet for Healthcare Providers: SeriousBroker.it  This test is not yet approved or cleared by the [place] FDA and has been authorized for detection and/or diagnosis of SARS-CoV-2 by FDA under an Emergency Use Authorization (EUA). This EUA will remain in effect (meaning this test can be used) for the duration of the COVID-19 declaration under Section 564(b)(1) of the Act, 21 U.S.C. section 360bbb-3(b)(1), unless the authorization is terminated or revoked.  Performed at [facility], [address] [place], [place] [address]   Blood Culture (routine x 2)     Status: None (Preliminary result)   Collection Time: 08/03/22 10:49 AM   Specimen:  BLOOD RIGHT ARM  Result Value Ref Range Status   Specimen Description   Final    BLOOD  RIGHT ARM Performed at [facility], [address], [place], [place] [address]    Special Requests   Final    Blood Culture adequate volume BOTTLES DRAWN AEROBIC AND ANAEROBIC Performed at [facility], [address] [place], [place] [address]    Culture PENDING  Incomplete   Report Status PENDING  Incomplete     Radiological Exams on Admission: DG Chest Port 1 View  Result Date: 08/03/2022 CLINICAL DATA:  Weakness EXAM: PORTABLE CHEST 1 VIEW COMPARISON:  X-ray 07/22/2022 FINDINGS: Calcified and tortuous aorta. No consolidation, pneumothorax or effusion. No edema. Overlapping cardiac leads. Degenerative changes of the spine. Surgical clips in the left axillary region. IMPRESSION: No acute cardiopulmonary disease. Electronically Signed   By: Karen Kays M.D.   On: 08/03/2022 12:17    EKG: Independently reviewed. Sinus tachycardia, rate 108.   Assessment/Plan   1. Sepsis d/t UTI  - She has remote hx of ESBL but more recent urine culture grew Klebsiella resistant to ampicillin and nitrofurantoin  - Treat with Rocephin, trend lactate, follow cultures and clinical course   2. CAD  - No anginal symptoms  - Continue ASA, Lipitor, and metoprolol   3. Type II DM  - A1c was 6.3% in January 2024  - Check CBGs and use low-intensity SSI for now    4. Hypertension  - Continue Norvasc, losartan, and metoprolol   5. OSA  - CPAP at bedtime    DVT prophylaxis: Lovenox  Code Status: Full  Level of Care: Level of care: Med-Surg Family Communication: Daughter at bedside  Disposition Plan:  Patient is from: home  Anticipated d/c is to: Home  Anticipated d/c date is: Possibly as early as 08/04/22  Patient currently: Pending resolution of sepsis   Consults called: None Admission status: Observation     Briscoe Deutscher, MD [facility]  08/03/2022, 5:49 PM

## 2022-08-03 NOTE — ED Triage Notes (Addendum)
Pt to ED c/o "not feeling well" this episode started today, but has felt unwell for "weeks" Reports evaluated for the same recently. Pt today c/o abdominal pain, nausea, generalized weakness.   Pt voicing multiple complaints, states her friend tested positive for COVID , But denies cold symptoms

## 2022-08-03 NOTE — Plan of Care (Addendum)
Plan of Care Note for Accepted Transfer   Patient: Nancy [name]    ZOX:[id number]     Facility requesting transfer: [facility] Requesting Provider: Dr. Deretha Emory  73F admitted with sepsis due to UTI.  Tachycardic and febrile, lactate 2.1.  Given broad sepsis antibiotics, later found to have evidence of UTI on urinalysis.  Now with normal vital signs and mentating normally.  Most recent vitals, labs and radiology:  Blood pressure 133/82, pulse 96, temperature 99.3 F (37.4 C), temperature source Oral, resp. rate (!) 21, height 5\' 2"  (1.575 m), weight 77.1 kg, SpO2 99%.      Latest Ref Rng & Units 08/03/2022   10:52 AM 07/22/2022    1:46 PM 04/29/2021    6:30 AM  CBC  WBC 4.0 - 10.5 K/uL 7.9  10.2  12.0   Hemoglobin 12.0 - 15.0 g/dL 81.1  91.4  78.2   Hematocrit 36.0 - 46.0 % 35.1  37.1  35.2   Platelets 150 - 400 K/uL 147  178  220       Latest Ref Rng & Units 08/03/2022   10:52 AM 07/22/2022    1:46 PM 06/17/2022    2:37 PM  BMP  Glucose 70 - 99 mg/dL 956  213  086   BUN 8 - 23 mg/dL 12  26  19    Creatinine 0.44 - 1.00 mg/dL 5.78  4.69  6.29   BUN/Creat Ratio 12 - 28   17   Sodium 135 - 145 mmol/L 140  137  141   Potassium 3.5 - 5.1 mmol/L 3.8  3.6  4.4   Chloride 98 - 111 mmol/L 107  105  102   CO2 22 - 32 mmol/L 22  17  25    Calcium 8.9 - 10.3 mg/dL 9.2  9.4  52.8      DG Chest Port 1 View  Result Date: 08/03/2022 CLINICAL DATA:  Weakness EXAM: PORTABLE CHEST 1 VIEW COMPARISON:  X-ray 07/22/2022 FINDINGS: Calcified and tortuous aorta. No consolidation, pneumothorax or effusion. No edema. Overlapping cardiac leads. Degenerative changes of the spine. Surgical clips in the left axillary region. IMPRESSION: No acute cardiopulmonary disease. Electronically Signed   By: Karen Kays M.D.   On: 08/03/2022 12:17     The patient has been accepted for transfer to [facility] or [facility], depending on bed and resource availability.  The patient will remain  under the care and responsibility of the referring provider until they have arrived to our inpatient facility.  Author: Yared Barefoot Sharlette Dense, MD  08/03/2022  Check [email or web address] for on-call coverage.  Nursing staff, Please call [facility] Admits & Consults System-Wide number on Amion as soon as patient's arrival, so appropriate admitting provider can evaluate the pt.

## 2022-08-04 DIAGNOSIS — K219 Gastro-esophageal reflux disease without esophagitis: Secondary | ICD-10-CM | POA: Diagnosis present

## 2022-08-04 DIAGNOSIS — M109 Gout, unspecified: Secondary | ICD-10-CM | POA: Diagnosis present

## 2022-08-04 DIAGNOSIS — A4151 Sepsis due to Escherichia coli [E. coli]: Secondary | ICD-10-CM | POA: Diagnosis present

## 2022-08-04 DIAGNOSIS — A419 Sepsis, unspecified organism: Secondary | ICD-10-CM | POA: Diagnosis present

## 2022-08-04 DIAGNOSIS — E876 Hypokalemia: Secondary | ICD-10-CM | POA: Diagnosis present

## 2022-08-04 DIAGNOSIS — Z1611 Resistance to penicillins: Secondary | ICD-10-CM | POA: Diagnosis present

## 2022-08-04 DIAGNOSIS — Z7982 Long term (current) use of aspirin: Secondary | ICD-10-CM | POA: Diagnosis not present

## 2022-08-04 DIAGNOSIS — Z79899 Other long term (current) drug therapy: Secondary | ICD-10-CM | POA: Diagnosis not present

## 2022-08-04 DIAGNOSIS — I700 Atherosclerosis of aorta: Secondary | ICD-10-CM | POA: Diagnosis present

## 2022-08-04 DIAGNOSIS — Z8249 Family history of ischemic heart disease and other diseases of the circulatory system: Secondary | ICD-10-CM | POA: Diagnosis not present

## 2022-08-04 DIAGNOSIS — Z7984 Long term (current) use of oral hypoglycemic drugs: Secondary | ICD-10-CM | POA: Diagnosis not present

## 2022-08-04 DIAGNOSIS — Z8616 Personal history of COVID-19: Secondary | ICD-10-CM | POA: Diagnosis not present

## 2022-08-04 DIAGNOSIS — Z8619 Personal history of other infectious and parasitic diseases: Secondary | ICD-10-CM | POA: Diagnosis not present

## 2022-08-04 DIAGNOSIS — I272 Pulmonary hypertension, unspecified: Secondary | ICD-10-CM | POA: Diagnosis present

## 2022-08-04 DIAGNOSIS — I2510 Atherosclerotic heart disease of native coronary artery without angina pectoris: Secondary | ICD-10-CM | POA: Diagnosis present

## 2022-08-04 DIAGNOSIS — E1142 Type 2 diabetes mellitus with diabetic polyneuropathy: Secondary | ICD-10-CM | POA: Diagnosis present

## 2022-08-04 DIAGNOSIS — K5900 Constipation, unspecified: Secondary | ICD-10-CM | POA: Diagnosis present

## 2022-08-04 DIAGNOSIS — N390 Urinary tract infection, site not specified: Secondary | ICD-10-CM | POA: Diagnosis present

## 2022-08-04 DIAGNOSIS — N1832 Chronic kidney disease, stage 3b: Secondary | ICD-10-CM | POA: Diagnosis present

## 2022-08-04 DIAGNOSIS — E1122 Type 2 diabetes mellitus with diabetic chronic kidney disease: Secondary | ICD-10-CM | POA: Diagnosis present

## 2022-08-04 DIAGNOSIS — Z20822 Contact with and (suspected) exposure to covid-19: Secondary | ICD-10-CM | POA: Diagnosis present

## 2022-08-04 DIAGNOSIS — Z1152 Encounter for screening for COVID-19: Secondary | ICD-10-CM | POA: Diagnosis not present

## 2022-08-04 DIAGNOSIS — I1310 Hypertensive heart and chronic kidney disease without heart failure, with stage 1 through stage 4 chronic kidney disease, or unspecified chronic kidney disease: Secondary | ICD-10-CM | POA: Diagnosis present

## 2022-08-04 DIAGNOSIS — E785 Hyperlipidemia, unspecified: Secondary | ICD-10-CM | POA: Diagnosis present

## 2022-08-04 DIAGNOSIS — G4733 Obstructive sleep apnea (adult) (pediatric): Secondary | ICD-10-CM | POA: Diagnosis present

## 2022-08-04 LAB — GLUCOSE, CAPILLARY
Glucose-Capillary: 127 mg/dL — ABNORMAL HIGH (ref 70–99)
Glucose-Capillary: 199 mg/dL — ABNORMAL HIGH (ref 70–99)
Glucose-Capillary: 193 mg/dL — ABNORMAL HIGH (ref 70–99)
Glucose-Capillary: 183 mg/dL — ABNORMAL HIGH (ref 70–99)

## 2022-08-04 LAB — CBC
HCT: 31.3 % — ABNORMAL LOW (ref 36.0–46.0)
Hemoglobin: 10.4 g/dL — ABNORMAL LOW (ref 12.0–15.0)
MCH: 29.1 pg (ref 26.0–34.0)
MCHC: 33.2 g/dL (ref 30.0–36.0)
MCV: 87.7 fL (ref 80.0–100.0)
Platelets: 119 10*3/uL — ABNORMAL LOW (ref 150–400)
RBC: 3.57 MIL/uL — ABNORMAL LOW (ref 3.87–5.11)
RDW: 15.4 % (ref 11.5–15.5)
WBC: 9.2 10*3/uL (ref 4.0–10.5)
nRBC: 0 % (ref 0.0–0.2)

## 2022-08-04 LAB — BASIC METABOLIC PANEL
Anion gap: 8 (ref 5–15)
BUN: 11 mg/dL (ref 8–23)
CO2: 24 mmol/L (ref 22–32)
Calcium: 8.2 mg/dL — ABNORMAL LOW (ref 8.9–10.3)
Chloride: 107 mmol/L (ref 98–111)
Creatinine, Ser: 0.8 mg/dL (ref 0.44–1.00)
GFR, Estimated: >60 mL/min (ref >60)
Glucose, Bld: 136 mg/dL — ABNORMAL HIGH (ref 70–99)
Potassium: 3 mmol/L — ABNORMAL LOW (ref 3.5–5.1)
Sodium: 139 mmol/L (ref 135–145)

## 2022-08-04 LAB — CULTURE, BLOOD (ROUTINE X 2)
Special Requests: ADEQUATE
Special Requests: ADEQUATE

## 2022-08-04 LAB — LACTIC ACID, PLASMA: Lactic Acid, Venous: 2.4 mmol/L (ref 0.5–1.9)

## 2022-08-04 MED ORDER — POTASSIUM CHLORIDE CRYS ER 20 MEQ PO TBCR
40.0000 meq | EXTENDED_RELEASE_TABLET | Freq: Once | ORAL | Status: AC
  Administered 2022-08-04: 40 meq via ORAL
  Filled 2022-08-04: qty 2

## 2022-08-04 MED ORDER — METOPROLOL TARTRATE 12.5 MG HALF TABLET
12.5000 mg | ORAL_TABLET | Freq: Two times a day (BID) | ORAL | Status: DC
  Administered 2022-08-05 – 2022-08-07 (×5): 12.5 mg via ORAL
  Filled 2022-08-04 (×5): qty 1

## 2022-08-04 MED ORDER — GABAPENTIN 300 MG PO CAPS
600.0000 mg | ORAL_CAPSULE | Freq: Three times a day (TID) | ORAL | Status: AC | PRN
  Administered 2022-08-05: 600 mg via ORAL
  Filled 2022-08-04: qty 2

## 2022-08-04 MED ORDER — METOPROLOL TARTRATE 12.5 MG HALF TABLET: 12.5000 mg | ORAL_TABLET | Freq: Two times a day (BID) | ORAL | Status: DC

## 2022-08-04 NOTE — Progress Notes (Signed)
PROGRESS NOTE    Nancy [name]  YQM:[id number] DOB: 05-12-1940 DOA: 08/03/2022 PCP: Hillery Aldo, NP   Brief Narrative: 82 year old with past medical history significant for hypertension, diabetes type 2, CAD, OSA who presents complaining of fever chills, lower abdominal pain, nausea and fatigue.  Evaluation in the ED she was found to be febrile temperature 39.1, mildly tachycardic, lactic acid 2.1 subsequently increased to 1.  Patient was admitted with sepsis and bacteremia secondary to E. Coli    Assessment & Plan:   Principal Problem:   Sepsis secondary to UTI [facility]) Active Problems:   Essential hypertension, benign   CAD (coronary artery disease), native coronary artery   OSA on CPAP   Type 2 diabetes mellitus with stage 3b chronic kidney disease, without long-term current use of insulin (HCC)   1-Sepsis secondary to UTI E. coli bacteremia: Presents with fever, tachycardia, tachypnea, lactic acidosis at 3.0, UA with 6-10 white blood cell, positive nitrate. Continue with IV ceftriaxone, IV fluids.  Blood culture: grew E coli.   2-CAD: Continue with aspirin, statins.   Diabetes type 2: Hemoglobin A1c 6.3 in January 2024  Hypertension: On metoprolol. She takes 12.5 mg BID.  Continue with Norvasc.   OSA: Continue with CPAP at bedtime  Hypokalemia; replete orally.    Estimated body mass index is 32.38 kg/m as calculated from the following:   Height as of this encounter: 5\' 2"  (1.575 m).   Weight as of this encounter: 80.3 kg.   DVT prophylaxis: Lovenox Code Status: Full code Family Communication: Care discussed with patient.  Disposition Plan:  Status is: Observation The patient remains OBS appropriate and will d/c before 2 midnights.    Consultants:  none  Procedures:  None  Antimicrobials:    Subjective: She denies abdominal pain. Denies Upper Respiratory Symptoms.    Objective: Vitals:   08/03/22 1656 08/03/22 2052 08/03/22 2333 08/04/22  0509  BP: (!) 117/55 (!) 121/59 136/65 130/60  Pulse: 86 81 80 78  Resp: 16 17 18 16   Temp: 99.1 F (37.3 C) 98.3 F (36.8 C)  98.2 F (36.8 C)  TempSrc:      SpO2: 97% 98% 99% 97%  Weight: 80.3 kg     Height: 5\' 2"  (1.575 m)       Intake/Output Summary (Last 24 hours) at 08/04/2022 0816 Last data filed at 08/04/2022 0645 Gross per 24 hour  Intake 3488.33 ml  Output 2450 ml  Net 1038.33 ml   Filed Weights   08/03/22 1024 08/03/22 1656  Weight: 77.1 kg 80.3 kg    Examination:  General exam: Appears calm and comfortable  Respiratory system: Clear to auscultation. Respiratory effort normal. Cardiovascular system: S1 & S2 heard, RRR.  Gastrointestinal system: Abdomen is nondistended, soft and nontender.  Central nervous system: Alert and oriented.  Extremities: Symmetric 5 x 5 power.   Data Reviewed: I have personally reviewed following labs and imaging studies  CBC: Recent Labs  Lab 08/03/22 1052 08/04/22 0339  WBC 7.9 9.2  NEUTROABS 6.7  --   HGB 11.6* 10.4*  HCT 35.1* 31.3*  MCV 86.9 87.7  PLT 147* 119*   Basic Metabolic Panel: Recent Labs  Lab 08/03/22 1052 08/04/22 0339  NA 140 139  K 3.8 3.0*  CL 107 107  CO2 22 24  GLUCOSE 167* 136*  BUN 12 11  CREATININE 0.91 0.80  CALCIUM 9.2 8.2*   GFR: Estimated Creatinine Clearance: 55.1 mL/min (by C-G formula based on SCr of 0.8 mg/dL).  Liver Function Tests: Recent Labs  Lab 08/03/22 1052  AST 25  ALT 24  ALKPHOS 103  BILITOT 1.5*  PROT 7.6  ALBUMIN 4.0   No results for input(s): "LIPASE", "AMYLASE" in the last 168 hours. No results for input(s): "AMMONIA" in the last 168 hours. Coagulation Profile: Recent Labs  Lab 08/03/22 1052  INR 1.1   Cardiac Enzymes: No results for input(s): "CKTOTAL", "CKMB", "CKMBINDEX", "TROPONINI" in the last 168 hours. BNP (last 3 results) No results for input(s): "PROBNP" in the last 8760 hours. HbA1C: No results for input(s): "HGBA1C" in the last 72  hours. CBG: Recent Labs  Lab 08/03/22 1029 08/03/22 1707 08/03/22 2204 08/03/22 2329 08/04/22 0753  GLUCAP 149* 163* 204* 182* 127*   Lipid Profile: No results for input(s): "CHOL", "HDL", "LDLCALC", "TRIG", "CHOLHDL", "LDLDIRECT" in the last 72 hours. Thyroid Function Tests: No results for input(s): "TSH", "T4TOTAL", "FREET4", "T3FREE", "THYROIDAB" in the last 72 hours. Anemia Panel: No results for input(s): "VITAMINB12", "FOLATE", "FERRITIN", "TIBC", "IRON", "RETICCTPCT" in the last 72 hours. Sepsis Labs: Recent Labs  Lab 08/03/22 1052 08/03/22 1228 08/03/22 1525 08/03/22 2259  LATICACIDVEN 2.1* 2.9* 3.1* 2.4*    Recent Results (from the past 240 hour(s))  Resp panel by RT-PCR (RSV, Flu A&B, Covid) Anterior Nasal Swab     Status: None   Collection Time: 08/03/22 10:31 AM   Specimen: Anterior Nasal Swab  Result Value Ref Range Status   SARS Coronavirus 2 by RT PCR NEGATIVE NEGATIVE Final    Comment: (NOTE) SARS-CoV-2 target nucleic acids are NOT DETECTED.  The SARS-CoV-2 RNA is generally detectable in upper respiratory specimens during the acute phase of infection. The lowest concentration of SARS-CoV-2 viral copies this assay can detect is 138 copies/mL. A negative result does not preclude SARS-Cov-2 infection and should not be used as the sole basis for treatment or other patient management decisions. A negative result may occur with  improper specimen collection/handling, submission of specimen other than nasopharyngeal swab, presence of viral mutation(s) within the areas targeted by this assay, and inadequate number of viral copies(<138 copies/mL). A negative result must be combined with clinical observations, patient history, and epidemiological information. The expected result is Negative.  Fact Sheet for Patients:  BloggerCourse.com  Fact Sheet for Healthcare Providers:  SeriousBroker.it  This test is no t  yet approved or cleared by the [place] FDA and  has been authorized for detection and/or diagnosis of SARS-CoV-2 by FDA under an Emergency Use Authorization (EUA). This EUA will remain  in effect (meaning this test can be used) for the duration of the COVID-19 declaration under Section 564(b)(1) of the Act, 21 U.S.C.section 360bbb-3(b)(1), unless the authorization is terminated  or revoked sooner.       Influenza A by PCR NEGATIVE NEGATIVE Final   Influenza B by PCR NEGATIVE NEGATIVE Final    Comment: (NOTE) The Xpert Xpress SARS-CoV-2/FLU/RSV plus assay is intended as an aid in the diagnosis of influenza from Nasopharyngeal swab specimens and should not be used as a sole basis for treatment. Nasal washings and aspirates are unacceptable for Xpert Xpress SARS-CoV-2/FLU/RSV testing.  Fact Sheet for Patients: BloggerCourse.com  Fact Sheet for Healthcare Providers: SeriousBroker.it  This test is not yet approved or cleared by the [place] FDA and has been authorized for detection and/or diagnosis of SARS-CoV-2 by FDA under an Emergency Use Authorization (EUA). This EUA will remain in effect (meaning this test can be used) for the duration of the COVID-19 declaration under  Section 564(b)(1) of the Act, 21 U.S.C. section 360bbb-3(b)(1), unless the authorization is terminated or revoked.     Resp Syncytial Virus by PCR NEGATIVE NEGATIVE Final    Comment: (NOTE) Fact Sheet for Patients: BloggerCourse.com  Fact Sheet for Healthcare Providers: SeriousBroker.it  This test is not yet approved or cleared by the [place] FDA and has been authorized for detection and/or diagnosis of SARS-CoV-2 by FDA under an Emergency Use Authorization (EUA). This EUA will remain in effect (meaning this test can be used) for the duration of the COVID-19 declaration under Section 564(b)(1)  of the Act, 21 U.S.C. section 360bbb-3(b)(1), unless the authorization is terminated or revoked.  Performed at [facility], [address], [place], [place] [address]   Blood Culture (routine x 2)     Status: None (Preliminary result)   Collection Time: 08/03/22 10:49 AM   Specimen: BLOOD RIGHT ARM  Result Value Ref Range Status   Specimen Description   Final    BLOOD RIGHT ARM Performed at [facility], [address], [place], [place] [address]    Special Requests   Final    Blood Culture adequate volume BOTTLES DRAWN AEROBIC AND ANAEROBIC Performed at [facility], [address], [place], [place] [address]    Culture  Setup Time   Final    GRAM NEGATIVE RODS IN BOTH AEROBIC AND ANAEROBIC BOTTLES CRITICAL RESULT CALLED TO, READ BACK BY AND VERIFIED WITH: Spero Curb [id number] @ 2318 FH Performed at [facility], [address], [place], [place] [address]    Culture GRAM NEGATIVE RODS  Final   Report Status PENDING  Incomplete  Blood Culture ID Panel (Reflexed)     Status: Abnormal   Collection Time: 08/03/22 10:49 AM  Result Value Ref Range Status   Enterococcus faecalis NOT DETECTED NOT DETECTED Final   Enterococcus Faecium NOT DETECTED NOT DETECTED Final   Listeria monocytogenes NOT DETECTED NOT DETECTED Final   Staphylococcus species NOT DETECTED NOT DETECTED Final   Staphylococcus aureus (BCID) NOT DETECTED NOT DETECTED Final   Staphylococcus epidermidis NOT DETECTED NOT DETECTED Final   Staphylococcus lugdunensis NOT DETECTED NOT DETECTED Final   Streptococcus species NOT DETECTED NOT DETECTED Final   Streptococcus agalactiae NOT DETECTED NOT DETECTED Final   Streptococcus pneumoniae NOT DETECTED NOT DETECTED Final   Streptococcus pyogenes NOT DETECTED NOT DETECTED Final   A.calcoaceticus-baumannii NOT DETECTED NOT DETECTED Final   Bacteroides fragilis NOT DETECTED NOT DETECTED Final   Enterobacterales DETECTED  (A) NOT DETECTED Final    Comment: Enterobacterales represent a large order of gram negative bacteria, not a single organism. CRITICAL RESULT CALLED TO, READ BACK BY AND VERIFIED WITH: PHARMD EJean Rosenthal [phone] @ 2318 FH    Enterobacter cloacae complex NOT DETECTED NOT DETECTED Final   Escherichia coli DETECTED (A) NOT DETECTED Final    Comment: CRITICAL RESULT CALLED TO, READ BACK BY AND VERIFIED WITH: PHARMD EJean Rosenthal [phone] @ 2318 FH    Klebsiella aerogenes NOT DETECTED NOT DETECTED Final   Klebsiella oxytoca NOT DETECTED NOT DETECTED Final   Klebsiella pneumoniae NOT DETECTED NOT DETECTED Final   Proteus species NOT DETECTED NOT DETECTED Final   Salmonella species NOT DETECTED NOT DETECTED Final   Serratia marcescens NOT DETECTED NOT DETECTED Final   Haemophilus influenzae NOT DETECTED NOT DETECTED Final   Neisseria meningitidis NOT DETECTED NOT DETECTED Final   Pseudomonas aeruginosa NOT DETECTED NOT DETECTED Final   Stenotrophomonas maltophilia NOT  DETECTED NOT DETECTED Final   Candida albicans NOT DETECTED NOT DETECTED Final   Candida auris NOT DETECTED NOT DETECTED Final   Candida glabrata NOT DETECTED NOT DETECTED Final   Candida krusei NOT DETECTED NOT DETECTED Final   Candida parapsilosis NOT DETECTED NOT DETECTED Final   Candida tropicalis NOT DETECTED NOT DETECTED Final   Cryptococcus neoformans/gattii NOT DETECTED NOT DETECTED Final   CTX-M ESBL NOT DETECTED NOT DETECTED Final   Carbapenem resistance IMP NOT DETECTED NOT DETECTED Final   Carbapenem resistance KPC NOT DETECTED NOT DETECTED Final   Carbapenem resistance NDM NOT DETECTED NOT DETECTED Final   Carbapenem resist OXA 48 LIKE NOT DETECTED NOT DETECTED Final   Carbapenem resistance VIM NOT DETECTED NOT DETECTED Final    Comment: Performed at [facility], [address], [place], [place] [address]  Blood Culture (routine x 2)     Status: None (Preliminary result)   Collection Time: 08/03/22 10:52 AM    Specimen: BLOOD LEFT FOREARM  Result Value Ref Range Status   Specimen Description   Final    BLOOD LEFT FOREARM Performed at [facility], [address], [place], [place] [address]    Special Requests   Final    BOTTLES DRAWN AEROBIC AND ANAEROBIC Blood Culture adequate volume Performed at [facility], [address], [place], [place] [address]    Culture  Setup Time   Final    GRAM NEGATIVE RODS IN BOTH AEROBIC AND ANAEROBIC BOTTLES CRITICAL RESULT CALLED TO, READ BACK BY AND VERIFIED WITH: Spero Curb [id number] @ 2318 FH Performed at [facility], [address] [place], [place] [address]    Culture PENDING  Incomplete   Report Status PENDING  Incomplete  MRSA Next Gen by PCR, Nasal     Status: None   Collection Time: 08/03/22  6:19 PM   Specimen: Nasal Mucosa; Nasal Swab  Result Value Ref Range Status   MRSA by PCR Next Gen NOT DETECTED NOT DETECTED Final    Comment: (NOTE) The GeneXpert MRSA Assay (FDA approved for NASAL specimens only), is one component of a comprehensive MRSA colonization surveillance program. It is not intended to diagnose MRSA infection nor to guide or monitor treatment for MRSA infections. Test performance is not FDA approved in patients less than 64 years old. Performed at [facility], [address] [place], [place] [address]          Radiology Studies: [facility] Chest Port 1 View  Result Date: 08/03/2022 CLINICAL DATA:  Weakness EXAM: PORTABLE CHEST 1 VIEW COMPARISON:  X-ray 07/22/2022 FINDINGS: Calcified and tortuous aorta. No consolidation, pneumothorax or effusion. No edema. Overlapping cardiac leads. Degenerative changes of the spine. Surgical clips in the left axillary region. IMPRESSION: No acute cardiopulmonary disease. Electronically Signed   By: Karen Kays M.D.   On: 08/03/2022 12:17        Scheduled Meds:  allopurinol  300 mg Oral Daily   amLODipine  5 mg Oral  Daily   aspirin EC  81 mg Oral Daily   atorvastatin  10 mg Oral Daily   enoxaparin (LOVENOX) injection  40 mg Subcutaneous Q24H   insulin aspart  0-5 Units Subcutaneous QHS   insulin aspart  0-6 Units Subcutaneous TID WC   losartan  25 mg Oral QHS   metoprolol tartrate  25 mg Oral BID   Continuous Infusions:  sodium chloride Stopped (08/03/22 1426)   cefTRIAXone (ROCEPHIN)  IV 2  g (08/03/22 2345)     LOS: 0 days    Time spent: 35 minutes    Nikole Swartzentruber A Terika Pillard, MD [facility]   If 7PM-7AM, please contact night-coverage [email or web address]  08/04/2022, 8:16 AM

## 2022-08-05 ENCOUNTER — Inpatient Hospital Stay (HOSPITAL_COMMUNITY): Payer: 59

## 2022-08-05 DIAGNOSIS — A419 Sepsis, unspecified organism: Secondary | ICD-10-CM | POA: Diagnosis not present

## 2022-08-05 DIAGNOSIS — N390 Urinary tract infection, site not specified: Secondary | ICD-10-CM | POA: Diagnosis not present

## 2022-08-05 LAB — CBC
HCT: 31.9 % — ABNORMAL LOW (ref 36.0–46.0)
Hemoglobin: 10.3 g/dL — ABNORMAL LOW (ref 12.0–15.0)
MCH: 28.9 pg (ref 26.0–34.0)
MCHC: 32.3 g/dL (ref 30.0–36.0)
MCV: 89.6 fL (ref 80.0–100.0)
Platelets: 112 10*3/uL — ABNORMAL LOW (ref 150–400)
RBC: 3.56 MIL/uL — ABNORMAL LOW (ref 3.87–5.11)
RDW: 15.6 % — ABNORMAL HIGH (ref 11.5–15.5)
WBC: 6.8 10*3/uL (ref 4.0–10.5)

## 2022-08-05 LAB — BASIC METABOLIC PANEL
Anion gap: 10 (ref 5–15)
BUN: 10 mg/dL (ref 8–23)
CO2: 23 mmol/L (ref 22–32)
Calcium: 8.2 mg/dL — ABNORMAL LOW (ref 8.9–10.3)
Chloride: 104 mmol/L (ref 98–111)
Creatinine, Ser: 0.75 mg/dL (ref 0.44–1.00)
Potassium: 3.5 mmol/L (ref 3.5–5.1)
Sodium: 137 mmol/L (ref 135–145)

## 2022-08-05 LAB — CULTURE, BLOOD (ROUTINE X 2)

## 2022-08-05 LAB — GLUCOSE, CAPILLARY
Glucose-Capillary: 125 mg/dL — ABNORMAL HIGH (ref 70–99)
Glucose-Capillary: 238 mg/dL — ABNORMAL HIGH (ref 70–99)
Glucose-Capillary: 160 mg/dL — ABNORMAL HIGH (ref 70–99)
Glucose-Capillary: 178 mg/dL — ABNORMAL HIGH (ref 70–99)

## 2022-08-05 MED ORDER — SENNOSIDES-DOCUSATE SODIUM 8.6-50 MG PO TABS
1.0000 | ORAL_TABLET | Freq: Two times a day (BID) | ORAL | Status: DC
  Administered 2022-08-05 – 2022-08-07 (×3): 1 via ORAL
  Filled 2022-08-05 (×4): qty 1

## 2022-08-05 MED ORDER — POLYETHYLENE GLYCOL 3350 17 G PO PACK
17.0000 g | PACK | Freq: Every day | ORAL | Status: DC
  Administered 2022-08-05: 17 g via ORAL
  Filled 2022-08-05: qty 1

## 2022-08-05 NOTE — Plan of Care (Signed)
  Problem: Education: Goal: Knowledge of General Education information will improve Description: Including pain rating scale, medication(s)/side effects and non-pharmacologic comfort measures Outcome: Progressing   Problem: Health Behavior/Discharge Planning: Goal: Ability to manage health-related needs will improve Outcome: Progressing   Problem: Clinical Measurements: Goal: Ability to maintain clinical measurements within normal limits will improve Outcome: Progressing Goal: Will remain free from infection Outcome: Progressing Goal: Diagnostic test results will improve Outcome: Progressing Goal: Respiratory complications will improve Outcome: Progressing Goal: Cardiovascular complication will be avoided Outcome: Progressing   Problem: Activity: Goal: Risk for activity intolerance will decrease Outcome: Progressing   Problem: Nutrition: Goal: Adequate nutrition will be maintained Outcome: Progressing   Problem: Coping: Goal: Level of anxiety will decrease Outcome: Progressing   Problem: Elimination: Goal: Will not experience complications related to bowel motility Outcome: Progressing Goal: Will not experience complications related to urinary retention Outcome: Progressing   Problem: Pain Managment: Goal: General experience of comfort will improve Outcome: Progressing   Problem: Safety: Goal: Ability to remain free from injury will improve Outcome: Progressing   Problem: Skin Integrity: Goal: Risk for impaired skin integrity will decrease Outcome: Progressing   Problem: Education: Goal: Ability to describe self-care measures that may prevent or decrease complications (Diabetes Survival Skills Education) will improve Outcome: Progressing Goal: Individualized Educational Video(s) Outcome: Progressing   Problem: Coping: Goal: Ability to adjust to condition or change in health will improve Outcome: Progressing   Problem: Fluid Volume: Goal: Ability to  maintain a balanced intake and output will improve Outcome: Progressing   Problem: Health Behavior/Discharge Planning: Goal: Ability to identify and utilize available resources and services will improve Outcome: Progressing Goal: Ability to manage health-related needs will improve Outcome: Progressing   Problem: Metabolic: Goal: Ability to maintain appropriate glucose levels will improve Outcome: Progressing   Problem: Nutritional: Goal: Maintenance of adequate nutrition will improve Outcome: Progressing Goal: Progress toward achieving an optimal weight will improve Outcome: Progressing   Problem: Skin Integrity: Goal: Risk for impaired skin integrity will decrease Outcome: Progressing   Problem: Tissue Perfusion: Goal: Adequacy of tissue perfusion will improve Outcome: Progressing   Problem: Fluid Volume: Goal: Hemodynamic stability will improve Outcome: Progressing   Problem: Clinical Measurements: Goal: Diagnostic test results will improve Outcome: Progressing Goal: Signs and symptoms of infection will decrease Outcome: Progressing   Problem: Respiratory: Goal: Ability to maintain adequate ventilation will improve Outcome: Progressing   

## 2022-08-05 NOTE — Progress Notes (Signed)
   08/05/22 2211  BiPAP/CPAP/SIPAP  BiPAP/CPAP/SIPAP Pt Type Adult  Reason BIPAP/CPAP not in use Non-compliant  BiPAP/CPAP /SiPAP Vitals  Temp 98.7 F (37.1 C)  Pulse Rate 79  Resp 17  BP (!) 151/71  SpO2 99 %  MEWS Score/Color  MEWS Score 0  MEWS Score Color Green   Pt. Declined CPAP, aware to notify if wanting unit to be placed on this shift.

## 2022-08-05 NOTE — Progress Notes (Addendum)
PROGRESS NOTE    Nancy [name]  ZOX:[id number] DOB: April 10, 1940 DOA: 08/03/2022 PCP: Hillery Aldo, NP   Brief Narrative: 82 year old with past medical history significant for hypertension, diabetes type 2, CAD, OSA who presents complaining of fever chills, lower abdominal pain, nausea and fatigue.  Evaluation in the ED she was found to be febrile temperature 39.1, mildly tachycardic, lactic acid 2.1 subsequently increased to 1.  Patient was admitted with sepsis and bacteremia secondary to E. Coli    Assessment & Plan:   Principal Problem:   Sepsis secondary to UTI [facility] Active Problems:   Essential hypertension, benign   CAD (coronary artery disease), native coronary artery   OSA on CPAP   Type 2 diabetes mellitus with stage 3b chronic kidney disease, without long-term current use of insulin (HCC)   1-Sepsis secondary to UTI E. coli bacteremia: Presents with fever, tachycardia, tachypnea, lactic acidosis at 3.0, UA with 6-10 white blood cell, positive nitrate. Continue with IV ceftriaxone, IV fluids.  Blood culture: grew E coli.  Urine culture not send.  Suspect Bacteremia from presume UTI.   2-CAD: Continue with aspirin, statins.   Diabetes type 2: Hemoglobin A1c 6.3 in January 2024  Hypertension: On metoprolol. She takes 12.5 mg BID.  Continue with Norvasc.   OSA: Continue with CPAP at bedtime  Hypokalemia; replete orally.  Constipation; Start Miralax. Check KUB   Estimated body mass index is 32.66 kg/m as calculated from the following:   Height as of this encounter: 5\' 2"  (1.575 m).   Weight as of this encounter: 81 kg.   DVT prophylaxis: Lovenox Code Status: Full code Family Communication: Care discussed with patient.  Disposition Plan:  Status is: Observation The patient remains OBS appropriate and will d/c before 2 midnights.    Consultants:  none  Procedures:  None  Antimicrobials:    Subjective: She report feeling ache today. Tired  of been in bed. No BM in several days.    Objective: Vitals:   08/04/22 2029 08/05/22 0500 08/05/22 0647 08/05/22 0658  BP: (!) 138/56   126/68  Pulse: 73  75 76  Resp: 16  16   Temp: 98.6 F (37 C)  98.2 F (36.8 C)   TempSrc:   Oral   SpO2: 100%  95%   Weight:  81 kg    Height:        Intake/Output Summary (Last 24 hours) at 08/05/2022 1328 Last data filed at 08/05/2022 0106 Gross per 24 hour  Intake 540.4 ml  Output 1400 ml  Net -859.6 ml   Filed Weights   08/03/22 1656 08/04/22 0950 08/05/22 0500  Weight: 80.3 kg 82.3 kg 81 kg    Examination:  General exam: NAD Respiratory system: CTA Cardiovascular system: S 1, S 2 RRR Gastrointestinal system: BS present, soft, nt Central nervous system: Alert Extremities: no edema  Data Reviewed: I have personally reviewed following labs and imaging studies  CBC: Recent Labs  Lab 08/03/22 1052 08/04/22 0339 08/05/22 0332  WBC 7.9 9.2 6.8  NEUTROABS 6.7  --   --   HGB 11.6* 10.4* 10.3*  HCT 35.1* 31.3* 31.9*  MCV 86.9 87.7 89.6  PLT 147* 119* 112*   Basic Metabolic Panel: Recent Labs  Lab 08/03/22 1052 08/04/22 0339 08/05/22 0332  NA 140 139 137  K 3.8 3.0* 3.5  CL 107 107 104  CO2 22 24 23   GLUCOSE 167* 136* 134*  BUN 12 11 10   CREATININE 0.91 0.80 0.75  CALCIUM  9.2 8.2* 8.2*   GFR: Estimated Creatinine Clearance: 55.3 mL/min (by C-G formula based on SCr of 0.75 mg/dL). Liver Function Tests: Recent Labs  Lab 08/03/22 1052  AST 25  ALT 24  ALKPHOS 103  BILITOT 1.5*  PROT 7.6  ALBUMIN 4.0   No results for input(s): "LIPASE", "AMYLASE" in the last 168 hours. No results for input(s): "AMMONIA" in the last 168 hours. Coagulation Profile: Recent Labs  Lab 08/03/22 1052  INR 1.1   Cardiac Enzymes: No results for input(s): "CKTOTAL", "CKMB", "CKMBINDEX", "TROPONINI" in the last 168 hours. BNP (last 3 results) No results for input(s): "PROBNP" in the last 8760 hours. HbA1C: No results for  input(s): "HGBA1C" in the last 72 hours. CBG: Recent Labs  Lab 08/04/22 1233 08/04/22 1650 08/04/22 2216 08/05/22 0803 08/05/22 1157  GLUCAP 199* 193* 183* 125* 238*   Lipid Profile: No results for input(s): "CHOL", "HDL", "LDLCALC", "TRIG", "CHOLHDL", "LDLDIRECT" in the last 72 hours. Thyroid Function Tests: No results for input(s): "TSH", "T4TOTAL", "FREET4", "T3FREE", "THYROIDAB" in the last 72 hours. Anemia Panel: No results for input(s): "VITAMINB12", "FOLATE", "FERRITIN", "TIBC", "IRON", "RETICCTPCT" in the last 72 hours. Sepsis Labs: Recent Labs  Lab 08/03/22 1052 08/03/22 1228 08/03/22 1525 08/03/22 2259  LATICACIDVEN 2.1* 2.9* 3.1* 2.4*    Recent Results (from the past 240 hour(s))  Resp panel by RT-PCR (RSV, Flu A&B, Covid) Anterior Nasal Swab     Status: None   Collection Time: 08/03/22 10:31 AM   Specimen: Anterior Nasal Swab  Result Value Ref Range Status   SARS Coronavirus 2 by RT PCR NEGATIVE NEGATIVE Final    Comment: (NOTE) SARS-CoV-2 target nucleic acids are NOT DETECTED.  The SARS-CoV-2 RNA is generally detectable in upper respiratory specimens during the acute phase of infection. The lowest concentration of SARS-CoV-2 viral copies this assay can detect is 138 copies/mL. A negative result does not preclude SARS-Cov-2 infection and should not be used as the sole basis for treatment or other patient management decisions. A negative result may occur with  improper specimen collection/handling, submission of specimen other than nasopharyngeal swab, presence of viral mutation(s) within the areas targeted by this assay, and inadequate number of viral copies(<138 copies/mL). A negative result must be combined with clinical observations, patient history, and epidemiological information. The expected result is Negative.  Fact Sheet for Patients:  BloggerCourse.com  Fact Sheet for Healthcare Providers:   SeriousBroker.it  This test is no t yet approved or cleared by the [place] FDA and  has been authorized for detection and/or diagnosis of SARS-CoV-2 by FDA under an Emergency Use Authorization (EUA). This EUA will remain  in effect (meaning this test can be used) for the duration of the COVID-19 declaration under Section 564(b)(1) of the Act, 21 U.S.C.section 360bbb-3(b)(1), unless the authorization is terminated  or revoked sooner.       Influenza A by PCR NEGATIVE NEGATIVE Final   Influenza B by PCR NEGATIVE NEGATIVE Final    Comment: (NOTE) The Xpert Xpress SARS-CoV-2/FLU/RSV plus assay is intended as an aid in the diagnosis of influenza from Nasopharyngeal swab specimens and should not be used as a sole basis for treatment. Nasal washings and aspirates are unacceptable for Xpert Xpress SARS-CoV-2/FLU/RSV testing.  Fact Sheet for Patients: BloggerCourse.com  Fact Sheet for Healthcare Providers: SeriousBroker.it  This test is not yet approved or cleared by the [place] FDA and has been authorized for detection and/or diagnosis of SARS-CoV-2 by FDA under an Emergency Use Authorization (EUA).  This EUA will remain in effect (meaning this test can be used) for the duration of the COVID-19 declaration under Section 564(b)(1) of the Act, 21 U.S.C. section 360bbb-3(b)(1), unless the authorization is terminated or revoked.     Resp Syncytial Virus by PCR NEGATIVE NEGATIVE Final    Comment: (NOTE) Fact Sheet for Patients: BloggerCourse.com  Fact Sheet for Healthcare Providers: SeriousBroker.it  This test is not yet approved or cleared by the [place] FDA and has been authorized for detection and/or diagnosis of SARS-CoV-2 by FDA under an Emergency Use Authorization (EUA). This EUA will remain in effect (meaning this test can be used) for  the duration of the COVID-19 declaration under Section 564(b)(1) of the Act, 21 U.S.C. section 360bbb-3(b)(1), unless the authorization is terminated or revoked.  Performed at [facility], [address] [place], [place] [address]   Blood Culture (routine x 2)     Status: Abnormal (Preliminary result)   Collection Time: 08/03/22 10:49 AM   Specimen: BLOOD RIGHT ARM  Result Value Ref Range Status   Specimen Description   Final    BLOOD RIGHT ARM Performed at [facility], [address] [place], [place] [address]    Special Requests   Final    Blood Culture adequate volume BOTTLES DRAWN AEROBIC AND ANAEROBIC Performed at [facility], [address] [place], [place] [address]    Culture  Setup Time   Final    GRAM NEGATIVE RODS IN BOTH AEROBIC AND ANAEROBIC BOTTLES CRITICAL RESULT CALLED TO, READ BACK BY AND VERIFIED WITH: Spero Curb [phone] @ 2318 FH    Culture (A)  Final    ESCHERICHIA COLI SUSCEPTIBILITIES TO FOLLOW Performed at [facility], [address], [place], [place] [address]    Report Status PENDING  Incomplete  Blood Culture ID Panel (Reflexed)     Status: Abnormal   Collection Time: 08/03/22 10:49 AM  Result Value Ref Range Status   Enterococcus faecalis NOT DETECTED NOT DETECTED Final   Enterococcus Faecium NOT DETECTED NOT DETECTED Final   Listeria monocytogenes NOT DETECTED NOT DETECTED Final   Staphylococcus species NOT DETECTED NOT DETECTED Final   Staphylococcus aureus (BCID) NOT DETECTED NOT DETECTED Final   Staphylococcus epidermidis NOT DETECTED NOT DETECTED Final   Staphylococcus lugdunensis NOT DETECTED NOT DETECTED Final   Streptococcus species NOT DETECTED NOT DETECTED Final   Streptococcus agalactiae NOT DETECTED NOT DETECTED Final   Streptococcus pneumoniae NOT DETECTED NOT DETECTED Final   Streptococcus pyogenes NOT DETECTED NOT DETECTED Final   A.calcoaceticus-baumannii NOT  DETECTED NOT DETECTED Final   Bacteroides fragilis NOT DETECTED NOT DETECTED Final   Enterobacterales DETECTED (A) NOT DETECTED Final    Comment: Enterobacterales represent a large order of gram negative bacteria, not a single organism. CRITICAL RESULT CALLED TO, READ BACK BY AND VERIFIED WITH: PHARMD EJean Rosenthal [phone] @ 2318 FH    Enterobacter cloacae complex NOT DETECTED NOT DETECTED Final   Escherichia coli DETECTED (A) NOT DETECTED Final    Comment: CRITICAL RESULT CALLED TO, READ BACK BY AND VERIFIED WITH: PHARMD EJean Rosenthal [phone] @ 2318 FH    Klebsiella aerogenes NOT DETECTED NOT DETECTED Final   Klebsiella oxytoca NOT DETECTED NOT DETECTED Final   Klebsiella pneumoniae NOT DETECTED NOT DETECTED Final   Proteus species NOT DETECTED NOT DETECTED Final   Salmonella species NOT DETECTED NOT DETECTED Final   Serratia marcescens NOT DETECTED NOT DETECTED Final   Haemophilus influenzae NOT  DETECTED NOT DETECTED Final   Neisseria meningitidis NOT DETECTED NOT DETECTED Final   Pseudomonas aeruginosa NOT DETECTED NOT DETECTED Final   Stenotrophomonas maltophilia NOT DETECTED NOT DETECTED Final   Candida albicans NOT DETECTED NOT DETECTED Final   Candida auris NOT DETECTED NOT DETECTED Final   Candida glabrata NOT DETECTED NOT DETECTED Final   Candida krusei NOT DETECTED NOT DETECTED Final   Candida parapsilosis NOT DETECTED NOT DETECTED Final   Candida tropicalis NOT DETECTED NOT DETECTED Final   Cryptococcus neoformans/gattii NOT DETECTED NOT DETECTED Final   CTX-M ESBL NOT DETECTED NOT DETECTED Final   Carbapenem resistance IMP NOT DETECTED NOT DETECTED Final   Carbapenem resistance KPC NOT DETECTED NOT DETECTED Final   Carbapenem resistance NDM NOT DETECTED NOT DETECTED Final   Carbapenem resist OXA 48 LIKE NOT DETECTED NOT DETECTED Final   Carbapenem resistance VIM NOT DETECTED NOT DETECTED Final    Comment: Performed at [facility], [address], [place], [place]  [address]  Blood Culture (routine x 2)     Status: Abnormal (Preliminary result)   Collection Time: 08/03/22 10:52 AM   Specimen: BLOOD LEFT FOREARM  Result Value Ref Range Status   Specimen Description   Final    BLOOD LEFT FOREARM Performed at [facility], [address], [place], [place] [address]    Special Requests   Final    BOTTLES DRAWN AEROBIC AND ANAEROBIC Blood Culture adequate volume Performed at [facility], [address] [place], [place] [address]    Culture  Setup Time   Final    GRAM NEGATIVE RODS IN BOTH AEROBIC AND ANAEROBIC BOTTLES CRITICAL RESULT CALLED TO, READ BACK BY AND VERIFIED WITH: Spero Curb [phone] @ 2318 FH Performed at [facility], [address], [place], [place] [address]    Culture ESCHERICHIA COLI (A)  Final   Report Status PENDING  Incomplete  MRSA Next Gen by PCR, Nasal     Status: None   Collection Time: 08/03/22  6:19 PM   Specimen: Nasal Mucosa; Nasal Swab  Result Value Ref Range Status   MRSA by PCR Next Gen NOT DETECTED NOT DETECTED Final    Comment: (NOTE) The GeneXpert MRSA Assay (FDA approved for NASAL specimens only), is one component of a comprehensive MRSA colonization surveillance program. It is not intended to diagnose MRSA infection nor to guide or monitor treatment for MRSA infections. Test performance is not FDA approved in patients less than 89 years old. Performed at [facility], [address], [place], [place] [address]          Radiology Studies: No results found.      Scheduled Meds:  allopurinol  300 mg Oral Daily   amLODipine  5 mg Oral Daily   aspirin EC  81 mg Oral Daily   atorvastatin  10 mg Oral Daily   enoxaparin (LOVENOX) injection  40 mg Subcutaneous Q24H   insulin aspart  0-5 Units Subcutaneous QHS   insulin aspart  0-6 Units Subcutaneous TID WC   metoprolol tartrate  12.5 mg Oral BID   polyethylene glycol  17 g Oral  Daily   senna-docusate  1 tablet Oral BID   Continuous Infusions:  sodium chloride Stopped (08/03/22 1426)   cefTRIAXone (ROCEPHIN)  IV 2 g (08/05/22 0106)     LOS: 1 day    Time spent: 35 minutes    Barlow Harrison A Charlesetta Milliron, MD [facility]   If  7PM-7AM, please contact night-coverage [email or web address]  08/05/2022, 1:28 PM

## 2022-08-05 NOTE — TOC Initial Note (Signed)
Transition of Care [facility]) - Initial/Assessment Note    Patient Details  Name: Nancy [name] MRN: [id number] Date of Birth: 22-Aug-1940  Transition of Care [facility]) CM/SW Contact:    Adrian Prows, RN Phone Number: 08/05/2022, 10:50 AM  Clinical Narrative:                 [facility] consult for d/c planning; pt says she is from home and plans to return at d/c; she identified POC, dtr, [name] [phone]); pt denies IPV, food/housing insecurity, and difficulty paying utilities; she has transportation; pt says she has reading glasses and upper partial; she has cane, BSC, shower chair, and grab bars; pt says she does not have HH services or home oxygen; awaiting PT/OT eval; TOC will follow.  Expected Discharge Plan: Home/Self Care Barriers to Discharge: Continued Medical Work up   Patient Goals and CMS Choice Patient states their goals for this hospitalization and ongoing recovery are:: home          Expected Discharge Plan and Services   Discharge Planning Services: CM Consult   Living arrangements for the past 2 months: Single Family Home                                      Prior Living Arrangements/Services Living arrangements for the past 2 months: Single Family Home Lives with:: Adult Children Patient language and need for interpreter reviewed:: Yes Do you feel safe going back to the place where you live?: Yes      Need for Family Participation in Patient Care: Yes (Comment) Care giver support system in place?: Yes (comment) Current home services: DME (cane, BSC, shower chair) Criminal Activity/Legal Involvement Pertinent to Current Situation/Hospitalization: No - Comment as needed  Activities of Daily Living Home Assistive Devices/Equipment: Cane (specify quad or straight), CBG Meter ADL Screening (condition at time of admission) Patient's cognitive ability adequate to safely complete daily activities?: Yes Is the patient deaf or have difficulty  hearing?: No Does the patient have difficulty seeing, even when wearing glasses/contacts?: No Does the patient have difficulty concentrating, remembering, or making decisions?: No Patient able to express need for assistance with ADLs?: Yes Does the patient have difficulty dressing or bathing?: Yes Independently performs ADLs?: No Communication: Independent Dressing (OT): Needs assistance Is this a change from baseline?: Change from baseline, expected to last <3days Grooming: Needs assistance Is this a change from baseline?: Change from baseline, expected to last <3 days Feeding: Independent Is this a change from baseline?: Pre-admission baseline Bathing: Needs assistance Is this a change from baseline?: Change from baseline, expected to last <3 days Toileting: Needs assistance Is this a change from baseline?: Change from baseline, expected to last <3 days In/Out Bed: Needs assistance Is this a change from baseline?: Change from baseline, expected to last >3 days Walks in Home: Needs assistance Is this a change from baseline?: Change from baseline, expected to last >3 days Does the patient have difficulty walking or climbing stairs?: Yes Weakness of Legs: Both Weakness of Arms/Hands: None  Permission Sought/Granted Permission sought to share information with : Case Manager Permission granted to share information with : Yes, Verbal Permission Granted  Share Information with NAME: Case Manager     Permission granted to share info w Relationship: [name] (dtr) [phone]     Emotional Assessment Appearance:: Appears stated age Attitude/Demeanor/Rapport: Gracious Affect (typically observed): Accepting Orientation: : Oriented  to Self, Oriented to Place, Oriented to  Time, Oriented to Situation Alcohol / Substance Use: Not Applicable Psych Involvement: No (comment)  Admission diagnosis:  Sepsis secondary to UTI (HCC) [A41.9, N39.0] Sepsis, due to unspecified organism,  unspecified whether acute organ dysfunction present [facility]) [A41.9] Patient Active Problem List   Diagnosis Date Noted   Sepsis secondary to UTI (HCC) 08/03/2022   Dyslipidemia 05/28/2021   Syncope 04/28/2021   UTI (urinary tract infection) 04/28/2021   AKI (acute kidney injury) (HCC) 04/28/2021   OSA on CPAP 04/28/2021   Aortic atherosclerosis (HCC)    Type 2 diabetes mellitus with stage 3b chronic kidney disease, without long-term current use of insulin (HCC) 03/03/2020   Polyneuropathy associated with underlying disease (HCC) 03/03/2020   CAD (coronary artery disease), native coronary artery    Pulmonary hypertension, unspecified (HCC) 07/29/2017   Chest pain 10/07/2016   SOB (shortness of breath) 10/07/2016   Pseudogout of bilateral knees 03/16/2016   Osteoarthritis 03/16/2016   SIRS (systemic inflammatory response syndrome) (HCC) 03/14/2016   Elevated sed rate 03/14/2016   Primary osteoarthritis of both knees 01/01/2016   Postoperative wound infection 12/18/2015   Difficulty in walking, not elsewhere classified    Status post lumbar spinal fusion    Type 2 diabetes mellitus with diabetic polyneuropathy, without long-term current use of insulin (HCC)    Infection due to ESBL-producing Escherichia coli    Angioedema    Hyperlipidemia 10/01/2015   Gait instability 02/19/2013   Pulmonary hyperinflation 01/17/2013   Chronic diastolic CHF (congestive heart failure) (HCC)    Essential hypertension, benign 01/03/2013   PCP:  Hillery Aldo, NP Pharmacy:   [id number] - [place] - [address] [place]) [place] [address] Phone: [phone] Fax: [phone]     Social Determinants of Health (SDOH) Social History: SDOH Screenings   Food Insecurity: No Food Insecurity (08/05/2022)  Housing: Low Risk  (08/05/2022)  Transportation Needs: No Transportation Needs (08/05/2022)  Utilities: Not At Risk (08/05/2022)  Depression  (PHQ2-9): Low Risk  (11/04/2018)  Tobacco Use: Low Risk  (08/03/2022)   SDOH Interventions: Food Insecurity Interventions: Intervention Not Indicated, Inpatient TOC Housing Interventions: Intervention Not Indicated, Inpatient TOC Transportation Interventions: Intervention Not Indicated, Inpatient TOC Utilities Interventions: Intervention Not Indicated, Inpatient TOC   Readmission Risk Interventions     No data to display

## 2022-08-06 ENCOUNTER — Inpatient Hospital Stay (HOSPITAL_COMMUNITY): Payer: 59

## 2022-08-06 DIAGNOSIS — A419 Sepsis, unspecified organism: Secondary | ICD-10-CM | POA: Diagnosis not present

## 2022-08-06 DIAGNOSIS — N390 Urinary tract infection, site not specified: Secondary | ICD-10-CM | POA: Diagnosis not present

## 2022-08-06 LAB — GLUCOSE, CAPILLARY
Glucose-Capillary: 125 mg/dL — ABNORMAL HIGH (ref 70–99)
Glucose-Capillary: 263 mg/dL — ABNORMAL HIGH (ref 70–99)
Glucose-Capillary: 172 mg/dL — ABNORMAL HIGH (ref 70–99)
Glucose-Capillary: 235 mg/dL — ABNORMAL HIGH (ref 70–99)

## 2022-08-06 LAB — BASIC METABOLIC PANEL: CO2: 25 mmol/L (ref 22–32)

## 2022-08-06 MED ORDER — POTASSIUM CHLORIDE CRYS ER 20 MEQ PO TBCR
40.0000 meq | EXTENDED_RELEASE_TABLET | Freq: Once | ORAL | Status: AC
  Administered 2022-08-06: 40 meq via ORAL
  Filled 2022-08-06: qty 2

## 2022-08-06 MED ORDER — IOHEXOL 9 MG/ML PO SOLN
500.0000 mL | ORAL | Status: AC
  Administered 2022-08-06 (×2): 500 mL via ORAL

## 2022-08-06 MED ORDER — POLYETHYLENE GLYCOL 3350 17 G PO PACK
17.0000 g | PACK | Freq: Two times a day (BID) | ORAL | Status: DC
  Administered 2022-08-06 – 2022-08-07 (×3): 17 g via ORAL
  Filled 2022-08-06 (×3): qty 1

## 2022-08-06 MED ORDER — CEPHALEXIN 500 MG PO CAPS
500.0000 mg | ORAL_CAPSULE | Freq: Four times a day (QID) | ORAL | Status: DC
  Administered 2022-08-06: 500 mg via ORAL
  Filled 2022-08-06: qty 1

## 2022-08-06 MED ORDER — CIPROFLOXACIN HCL 500 MG PO TABS
500.0000 mg | ORAL_TABLET | Freq: Two times a day (BID) | ORAL | Status: DC
  Administered 2022-08-06 – 2022-08-07 (×2): 500 mg via ORAL
  Filled 2022-08-06 (×2): qty 1

## 2022-08-06 MED ORDER — GABAPENTIN 300 MG PO CAPS
600.0000 mg | ORAL_CAPSULE | Freq: Three times a day (TID) | ORAL | Status: DC | PRN
  Administered 2022-08-07: 600 mg via ORAL
  Filled 2022-08-06: qty 2

## 2022-08-06 MED ORDER — BISACODYL 5 MG PO TBEC
5.0000 mg | DELAYED_RELEASE_TABLET | Freq: Once | ORAL | Status: AC
  Administered 2022-08-06: 5 mg via ORAL
  Filled 2022-08-06: qty 1

## 2022-08-06 MED ORDER — SODIUM CHLORIDE (PF) 0.9 % IJ SOLN
INTRAMUSCULAR | Status: AC
  Filled 2022-08-06: qty 50

## 2022-08-06 MED ORDER — IOHEXOL 300 MG/ML  SOLN
100.0000 mL | Freq: Once | INTRAMUSCULAR | Status: AC | PRN
  Administered 2022-08-06: 100 mL via INTRAVENOUS

## 2022-08-06 MED ORDER — IOHEXOL 9 MG/ML PO SOLN
ORAL | Status: AC
  Filled 2022-08-06: qty 1000

## 2022-08-06 MED ORDER — GABAPENTIN 300 MG PO CAPS
600.0000 mg | ORAL_CAPSULE | Freq: Once | ORAL | Status: AC
  Administered 2022-08-06: 600 mg via ORAL
  Filled 2022-08-06: qty 6

## 2022-08-06 MED ORDER — SODIUM CHLORIDE 0.9 % IV SOLN: INTRAVENOUS | Status: DC

## 2022-08-06 MED ORDER — BISACODYL 10 MG RE SUPP: 10.0000 mg | Freq: Every day | RECTAL | Status: DC | PRN

## 2022-08-06 NOTE — Evaluation (Signed)
Physical Therapy Evaluation Patient Details Name: Nancy [name] MRN: [id number] DOB: 26-Dec-1940 Today's Date: 08/06/2022  History of Present Illness  82 year old with past medical history significant for hypertension, diabetes type 2, CAD, OSA, gout, chronic back pain and admitted with sepsis secondary to UTI  Clinical Impression  Patient evaluated by Physical Therapy with no further acute PT needs identified. All education has been completed and the patient has no further questions.  Pt reports no issues with her mobility except chronic balance issues.  Pt was able to ambulate in hallway without assistive device.  Pt declines any further needs at this time.  PT is signing off. Thank you for this referral.         Assistance Recommended at Discharge None  If plan is discharge home, recommend the following:  Can travel by private vehicle           Equipment Recommendations None recommended by PT  Recommendations for Other Services       Functional Status Assessment Patient has not had a recent decline in their functional status     Precautions / Restrictions Precautions Precautions: None      Mobility  Bed Mobility Overal bed mobility: Independent                  Transfers Overall transfer level: Modified independent                      Ambulation/Gait Ambulation/Gait assistance: Supervision, Modified independent (Device/Increase time) Gait Distance (Feet): 200 Feet Assistive device: None Gait Pattern/deviations: Step-through pattern, Decreased stride length, Drifts right/left       General Gait Details: occasional drifting, pt also reports hx of unsteady gait and peripheral neuropathy, states ambulation is at baseline  Stairs            Wheelchair Mobility     Tilt Bed    Modified Rankin (Stroke Patients Only)       Balance Overall balance assessment: Mild deficits observed, not formally tested                                            Pertinent Vitals/Pain Pain Assessment Pain Assessment: No/denies pain    Home Living Family/patient expects to be discharged to:: Private residence Living Arrangements: Children Available Help at Discharge: Family;Available 24 hours/day Type of Home: House Home Access: Stairs to enter   Entrance Stairs-Number of Steps: 1 Alternate Level Stairs-Number of Steps: pt stays on main level, daughter is upstairs Home Layout: Two level;Able to live on main level with bedroom/bathroom Home Equipment: Rolling Walker (2 wheels);BSC/3in1;Cane - single point      Prior Function Prior Level of Function : Independent/Modified Independent             Mobility Comments: has been to OPPT for balance in the past       Hand Dominance        Extremity/Trunk Assessment        Lower Extremity Assessment Lower Extremity Assessment: LLE deficits/detail;RLE deficits/detail RLE Sensation: history of peripheral neuropathy LLE Sensation: history of peripheral neuropathy       Communication   Communication: No difficulties  Cognition Arousal/Alertness: Awake/alert Behavior During Therapy: WFL for tasks assessed/performed Overall Cognitive Status: Within Functional Limits for tasks assessed  General Comments General comments (skin integrity, edema, etc.): Pt reports hx of balance issues and has been to outpatient PT for balance in the past, pt also reports hx of peripheral neuropathy and feels this likely contributes    Exercises     Assessment/Plan    PT Assessment Patient does not need any further PT services  PT Problem List         PT Treatment Interventions      PT Goals (Current goals can be found in the Care Plan section)  Acute Rehab PT Goals PT Goal Formulation: All assessment and education complete, DC therapy    Frequency       Co-evaluation               AM-PAC  PT "6 Clicks" Mobility  Outcome Measure Help needed turning from your back to your side while in a flat bed without using bedrails?: None Help needed moving from lying on your back to sitting on the side of a flat bed without using bedrails?: None Help needed moving to and from a bed to a chair (including a wheelchair)?: None Help needed standing up from a chair using your arms (e.g., wheelchair or bedside chair)?: None Help needed to walk in hospital room?: A Little Help needed climbing 3-5 steps with a railing? : A Little 6 Click Score: 22    End of Session Equipment Utilized During Treatment: Gait belt Activity Tolerance: Patient tolerated treatment well Patient left: in chair;with call bell/phone within reach Nurse Communication: Mobility status PT Visit Diagnosis: Difficulty in walking, not elsewhere classified (R26.2)    Time: 1610-9604 PT Time Calculation (min) (ACUTE ONLY): 12 min   Charges:   PT Evaluation $PT Eval Low Complexity: 1 Low   PT General Charges $$ ACUTE PT VISIT: 1 Visit        Paulino Door, DPT Physical Therapist Acute Rehabilitation Services Office: [phone]   Janan Halter Payson 08/06/2022, 1:39 PM

## 2022-08-06 NOTE — Progress Notes (Signed)
   08/06/22 2000  BiPAP/CPAP/SIPAP  Reason BIPAP/CPAP not in use Non-compliant

## 2022-08-06 NOTE — Progress Notes (Signed)
PROGRESS NOTE    Nancy [name]  ACZ:[id number] DOB: 1940/01/18 DOA: 08/03/2022 PCP: Hillery Aldo, NP   Brief Narrative: 82 year old with past medical history significant for hypertension, diabetes type 2, CAD, OSA who presents complaining of fever chills, lower abdominal pain, nausea and fatigue.  Evaluation in the ED she was found to be febrile temperature 39.1, mildly tachycardic, lactic acid 2.1 subsequently increased to 1.  Patient was admitted with sepsis and bacteremia secondary to E. Coli    Assessment & Plan:   Principal Problem:   Sepsis secondary to UTI [facility]) Active Problems:   Essential hypertension, benign   CAD (coronary artery disease), native coronary artery   OSA on CPAP   Type 2 diabetes mellitus with stage 3b chronic kidney disease, without long-term current use of insulin (HCC)   1-Sepsis secondary to UTI E. coli bacteremia: Presents with fever, tachycardia, tachypnea, lactic acidosis at 3.0, UA with 6-10 white blood cell, positive nitrate. Continue with IV ceftriaxone, IV fluids.  Blood culture: grew E coli.  Urine culture not send.  Suspect Bacteremia from presume UTI.  Report abdominal pain on and off at home. Will proceed with CT abdomen pelvis.   2-CAD: Continue with aspirin, statins.   Diabetes type 2: Hemoglobin A1c 6.3 in January 2024  Hypertension: On metoprolol. She takes 12.5 mg BID.  Continue with Norvasc.   OSA: Continue with CPAP at bedtime  Hypokalemia; replete orally.  Constipation; Started  Miralax. Dulcolax.    Estimated body mass index is 32.66 kg/m as calculated from the following:   Height as of this encounter: 5\' 2"  (1.575 m).   Weight as of this encounter: 81 kg.   DVT prophylaxis: Lovenox Code Status: Full code Family Communication: Care discussed with patient.  Disposition Plan:  Status is: Observation The patient remains OBS appropriate and will d/c before 2 midnights.    Consultants:   none  Procedures:  None  Antimicrobials:    Subjective: She is sitting recliner, feels ok, no BM yet,. She has had some abdominal pain on and off at home.   Objective: Vitals:   08/05/22 1351 08/05/22 2211 08/06/22 0552 08/06/22 1233  BP: 129/63 (!) 151/71 (!) 146/74 139/73  Pulse: 74 79 75 69  Resp: 18 17 16 17   Temp: 97.8 F (36.6 C) 98.7 F (37.1 C)  98.1 F (36.7 C)  TempSrc: Temporal Oral  Oral  SpO2: 100% 99% 97% 99%  Weight:      Height:        Intake/Output Summary (Last 24 hours) at 08/06/2022 1528 Last data filed at 08/06/2022 0900 Gross per 24 hour  Intake 237 ml  Output 750 ml  Net -513 ml   Filed Weights   08/03/22 1656 08/04/22 0950 08/05/22 0500  Weight: 80.3 kg 82.3 kg 81 kg    Examination:  General exam: NAD Respiratory system: CTA Cardiovascular system: S 1, S 2 RRR Gastrointestinal system: BS present, soft nt Central nervous system: Alert Extremities: no edema  Data Reviewed: I have personally reviewed following labs and imaging studies  CBC: Recent Labs  Lab 08/03/22 1052 08/04/22 0339 08/05/22 0332 08/06/22 0357  WBC 7.9 9.2 6.8 6.0  NEUTROABS 6.7  --   --   --   HGB 11.6* 10.4* 10.3* 9.9*  HCT 35.1* 31.3* 31.9* 30.7*  MCV 86.9 87.7 89.6 88.5  PLT 147* 119* 112* 128*   Basic Metabolic Panel: Recent Labs  Lab 08/03/22 1052 08/04/22 0339 08/05/22 0332 08/06/22 0357  NA  140 139 137 136  K 3.8 3.0* 3.5 3.4*  CL 107 107 104 101  CO2 22 24 23 25   GLUCOSE 167* 136* 134* 140*  BUN 12 11 10 14   CREATININE 0.91 0.80 0.75 0.91  CALCIUM 9.2 8.2* 8.2* 8.5*   GFR: Estimated Creatinine Clearance: 48.6 mL/min (by C-G formula based on SCr of 0.91 mg/dL). Liver Function Tests: Recent Labs  Lab 08/03/22 1052  AST 25  ALT 24  ALKPHOS 103  BILITOT 1.5*  PROT 7.6  ALBUMIN 4.0   No results for input(s): "LIPASE", "AMYLASE" in the last 168 hours. No results for input(s): "AMMONIA" in the last 168 hours. Coagulation  Profile: Recent Labs  Lab 08/03/22 1052  INR 1.1   Cardiac Enzymes: No results for input(s): "CKTOTAL", "CKMB", "CKMBINDEX", "TROPONINI" in the last 168 hours. BNP (last 3 results) No results for input(s): "PROBNP" in the last 8760 hours. HbA1C: No results for input(s): "HGBA1C" in the last 72 hours. CBG: Recent Labs  Lab 08/05/22 1157 08/05/22 1714 08/05/22 2202 08/06/22 0752 08/06/22 1155  GLUCAP 238* 160* 178* 125* 263*   Lipid Profile: No results for input(s): "CHOL", "HDL", "LDLCALC", "TRIG", "CHOLHDL", "LDLDIRECT" in the last 72 hours. Thyroid Function Tests: No results for input(s): "TSH", "T4TOTAL", "FREET4", "T3FREE", "THYROIDAB" in the last 72 hours. Anemia Panel: No results for input(s): "VITAMINB12", "FOLATE", "FERRITIN", "TIBC", "IRON", "RETICCTPCT" in the last 72 hours. Sepsis Labs: Recent Labs  Lab 08/03/22 1052 08/03/22 1228 08/03/22 1525 08/03/22 2259  LATICACIDVEN 2.1* 2.9* 3.1* 2.4*    Recent Results (from the past 240 hour(s))  Resp panel by RT-PCR (RSV, Flu A&B, Covid) Anterior Nasal Swab     Status: None   Collection Time: 08/03/22 10:31 AM   Specimen: Anterior Nasal Swab  Result Value Ref Range Status   SARS Coronavirus 2 by RT PCR NEGATIVE NEGATIVE Final    Comment: (NOTE) SARS-CoV-2 target nucleic acids are NOT DETECTED.  The SARS-CoV-2 RNA is generally detectable in upper respiratory specimens during the acute phase of infection. The lowest concentration of SARS-CoV-2 viral copies this assay can detect is 138 copies/mL. A negative result does not preclude SARS-Cov-2 infection and should not be used as the sole basis for treatment or other patient management decisions. A negative result may occur with  improper specimen collection/handling, submission of specimen other than nasopharyngeal swab, presence of viral mutation(s) within the areas targeted by this assay, and inadequate number of viral copies(<138 copies/mL). A negative result  must be combined with clinical observations, patient history, and epidemiological information. The expected result is Negative.  Fact Sheet for Patients:  BloggerCourse.com  Fact Sheet for Healthcare Providers:  SeriousBroker.it  This test is no t yet approved or cleared by the [place] FDA and  has been authorized for detection and/or diagnosis of SARS-CoV-2 by FDA under an Emergency Use Authorization (EUA). This EUA will remain  in effect (meaning this test can be used) for the duration of the COVID-19 declaration under Section 564(b)(1) of the Act, 21 U.S.C.section 360bbb-3(b)(1), unless the authorization is terminated  or revoked sooner.       Influenza A by PCR NEGATIVE NEGATIVE Final   Influenza B by PCR NEGATIVE NEGATIVE Final    Comment: (NOTE) The Xpert Xpress SARS-CoV-2/FLU/RSV plus assay is intended as an aid in the diagnosis of influenza from Nasopharyngeal swab specimens and should not be used as a sole basis for treatment. Nasal washings and aspirates are unacceptable for Xpert Xpress SARS-CoV-2/FLU/RSV testing.  Fact  Sheet for Patients: BloggerCourse.com  Fact Sheet for Healthcare Providers: SeriousBroker.it  This test is not yet approved or cleared by the [place] FDA and has been authorized for detection and/or diagnosis of SARS-CoV-2 by FDA under an Emergency Use Authorization (EUA). This EUA will remain in effect (meaning this test can be used) for the duration of the COVID-19 declaration under Section 564(b)(1) of the Act, 21 U.S.C. section 360bbb-3(b)(1), unless the authorization is terminated or revoked.     Resp Syncytial Virus by PCR NEGATIVE NEGATIVE Final    Comment: (NOTE) Fact Sheet for Patients: BloggerCourse.com  Fact Sheet for Healthcare Providers: SeriousBroker.it  This test is  not yet approved or cleared by the [place] FDA and has been authorized for detection and/or diagnosis of SARS-CoV-2 by FDA under an Emergency Use Authorization (EUA). This EUA will remain in effect (meaning this test can be used) for the duration of the COVID-19 declaration under Section 564(b)(1) of the Act, 21 U.S.C. section 360bbb-3(b)(1), unless the authorization is terminated or revoked.  Performed at [facility], [address], [place], [place] [address]   Blood Culture (routine x 2)     Status: Abnormal   Collection Time: 08/03/22 10:49 AM   Specimen: BLOOD RIGHT ARM  Result Value Ref Range Status   Specimen Description   Final    BLOOD RIGHT ARM Performed at [facility], [address] [place], [place] [address]    Special Requests   Final    Blood Culture adequate volume BOTTLES DRAWN AEROBIC AND ANAEROBIC Performed at [facility], [address] [place], [place] [address]    Culture  Setup Time   Final    GRAM NEGATIVE RODS IN BOTH AEROBIC AND ANAEROBIC BOTTLES CRITICAL RESULT CALLED TO, READ BACK BY AND VERIFIED WITH: Spero Curb [phone] @ 2318 FH Performed at [facility], [address] [place], [place] [address]    Culture ESCHERICHIA COLI (A)  Final   Report Status 08/06/2022 FINAL  Final   Organism ID, Bacteria ESCHERICHIA COLI  Final      Susceptibility   Escherichia coli - MIC*    AMPICILLIN >=32 RESISTANT Resistant     CEFEPIME <=0.12 SENSITIVE Sensitive     CEFTAZIDIME <=1 SENSITIVE Sensitive     CEFTRIAXONE <=0.25 SENSITIVE Sensitive     CIPROFLOXACIN <=0.25 SENSITIVE Sensitive     GENTAMICIN <=1 SENSITIVE Sensitive     IMIPENEM <=0.25 SENSITIVE Sensitive     TRIMETH/SULFA >=320 RESISTANT Resistant     AMPICILLIN/SULBACTAM >=32 RESISTANT Resistant     PIP/TAZO 64 INTERMEDIATE Intermediate     * ESCHERICHIA COLI  Blood Culture ID Panel (Reflexed)     Status: Abnormal   Collection  Time: 08/03/22 10:49 AM  Result Value Ref Range Status   Enterococcus faecalis NOT DETECTED NOT DETECTED Final   Enterococcus Faecium NOT DETECTED NOT DETECTED Final   Listeria monocytogenes NOT DETECTED NOT DETECTED Final   Staphylococcus species NOT DETECTED NOT DETECTED Final   Staphylococcus aureus (BCID) NOT DETECTED NOT DETECTED Final   Staphylococcus epidermidis NOT DETECTED NOT DETECTED Final   Staphylococcus lugdunensis NOT DETECTED NOT DETECTED Final   Streptococcus species NOT DETECTED NOT DETECTED Final   Streptococcus agalactiae NOT DETECTED NOT DETECTED Final   Streptococcus pneumoniae NOT DETECTED NOT DETECTED Final   Streptococcus pyogenes NOT DETECTED NOT DETECTED Final   A.calcoaceticus-baumannii NOT DETECTED NOT DETECTED Final   Bacteroides fragilis NOT DETECTED NOT DETECTED Final  Enterobacterales DETECTED (A) NOT DETECTED Final    Comment: Enterobacterales represent a large order of gram negative bacteria, not a single organism. CRITICAL RESULT CALLED TO, READ BACK BY AND VERIFIED WITH: PHARMD EJean Rosenthal [phone] @ 2318 FH    Enterobacter cloacae complex NOT DETECTED NOT DETECTED Final   Escherichia coli DETECTED (A) NOT DETECTED Final    Comment: CRITICAL RESULT CALLED TO, READ BACK BY AND VERIFIED WITH: PHARMD EJean Rosenthal [phone] @ 2318 FH    Klebsiella aerogenes NOT DETECTED NOT DETECTED Final   Klebsiella oxytoca NOT DETECTED NOT DETECTED Final   Klebsiella pneumoniae NOT DETECTED NOT DETECTED Final   Proteus species NOT DETECTED NOT DETECTED Final   Salmonella species NOT DETECTED NOT DETECTED Final   Serratia marcescens NOT DETECTED NOT DETECTED Final   Haemophilus influenzae NOT DETECTED NOT DETECTED Final   Neisseria meningitidis NOT DETECTED NOT DETECTED Final   Pseudomonas aeruginosa NOT DETECTED NOT DETECTED Final   Stenotrophomonas maltophilia NOT DETECTED NOT DETECTED Final   Candida albicans NOT DETECTED NOT DETECTED Final   Candida auris NOT  DETECTED NOT DETECTED Final   Candida glabrata NOT DETECTED NOT DETECTED Final   Candida krusei NOT DETECTED NOT DETECTED Final   Candida parapsilosis NOT DETECTED NOT DETECTED Final   Candida tropicalis NOT DETECTED NOT DETECTED Final   Cryptococcus neoformans/gattii NOT DETECTED NOT DETECTED Final   CTX-M ESBL NOT DETECTED NOT DETECTED Final   Carbapenem resistance IMP NOT DETECTED NOT DETECTED Final   Carbapenem resistance KPC NOT DETECTED NOT DETECTED Final   Carbapenem resistance NDM NOT DETECTED NOT DETECTED Final   Carbapenem resist OXA 48 LIKE NOT DETECTED NOT DETECTED Final   Carbapenem resistance VIM NOT DETECTED NOT DETECTED Final    Comment: Performed at [facility], [address], [place], [place] [address]  Blood Culture (routine x 2)     Status: Abnormal   Collection Time: 08/03/22 10:52 AM   Specimen: BLOOD LEFT FOREARM  Result Value Ref Range Status   Specimen Description   Final    BLOOD LEFT FOREARM Performed at [facility], [address] [place], [place] [address]    Special Requests   Final    BOTTLES DRAWN AEROBIC AND ANAEROBIC Blood Culture adequate volume Performed at [facility], [address], [place], [place] [address]    Culture  Setup Time   Final    GRAM NEGATIVE RODS IN BOTH AEROBIC AND ANAEROBIC BOTTLES CRITICAL RESULT CALLED TO, READ BACK BY AND VERIFIED WITH: PHARMD EJean Rosenthal [phone] @ 2318 FH    Culture (A)  Final    ESCHERICHIA COLI SUSCEPTIBILITIES PERFORMED ON PREVIOUS CULTURE WITHIN THE LAST 5 DAYS. Performed at [facility], [address] [place], [place] [address]    Report Status 08/06/2022 FINAL  Final  MRSA Next Gen by PCR, Nasal     Status: None   Collection Time: 08/03/22  6:19 PM   Specimen: Nasal Mucosa; Nasal Swab  Result Value Ref Range Status   MRSA by PCR Next Gen NOT DETECTED NOT DETECTED Final    Comment: (NOTE) The GeneXpert MRSA Assay (FDA approved for NASAL  specimens only), is one component of a comprehensive MRSA colonization surveillance program. It is not intended to diagnose MRSA infection nor to guide or monitor treatment for MRSA infections. Test performance is not FDA approved in patients less than 90 years old. Performed at [facility], [address], [place], [place] [address]  Radiology Studies: DG Abd 1 View  Result Date: 08/05/2022 CLINICAL DATA:  Constipation EXAM: ABDOMEN - 1 VIEW COMPARISON:  None Available. FINDINGS: The bowel gas pattern is normal. There is a moderate-to-large amount of stool throughout the colon. There surgical clips in the right upper quadrant. No suspicious calcifications are identified. Lower lumbar fusion hardware is present. Lung bases are clear. IMPRESSION: Moderate-to-large amount of stool throughout the colon. No evidence of bowel obstruction. Electronically Signed   By: Darliss Cheney M.D.   On: 08/05/2022 18:21        Scheduled Meds:  allopurinol  300 mg Oral Daily   amLODipine  5 mg Oral Daily   aspirin EC  81 mg Oral Daily   atorvastatin  10 mg Oral Daily   ciprofloxacin  500 mg Oral BID   enoxaparin (LOVENOX) injection  40 mg Subcutaneous Q24H   insulin aspart  0-5 Units Subcutaneous QHS   insulin aspart  0-6 Units Subcutaneous TID WC   metoprolol tartrate  12.5 mg Oral BID   polyethylene glycol  17 g Oral BID   senna-docusate  1 tablet Oral BID   Continuous Infusions:  sodium chloride Stopped (08/03/22 1426)   sodium chloride 75 mL/hr at 08/06/22 1129     LOS: 2 days    Time spent: 35 minutes    Mahki Spikes A Prynce Jacober, MD [facility]   If 7PM-7AM, please contact night-coverage [email or web address]  08/06/2022, 3:28 PM

## 2022-08-06 NOTE — Plan of Care (Signed)

## 2022-08-06 NOTE — Progress Notes (Signed)
OT Cancellation Note  Patient Details Name: SHENEICE BROCKSCHMIDT MRN: [id number] DOB: 18-Nov-1940   Cancelled Treatment:    Reason Eval/Treat Not Completed: OT screened, no needs identified, will sign off. Discussed with PT.   Ediberto Sens,HILLARY 08/06/2022, 1:58 PM Luisa Dago, OT/L   Acute OT Clinical Specialist Acute Rehabilitation Services Pager [phone] Office [phone]

## 2022-08-07 DIAGNOSIS — N390 Urinary tract infection, site not specified: Secondary | ICD-10-CM | POA: Diagnosis not present

## 2022-08-07 DIAGNOSIS — A419 Sepsis, unspecified organism: Secondary | ICD-10-CM | POA: Diagnosis not present

## 2022-08-07 LAB — GLUCOSE, CAPILLARY
Glucose-Capillary: 143 mg/dL — ABNORMAL HIGH (ref 70–99)
Glucose-Capillary: 249 mg/dL — ABNORMAL HIGH (ref 70–99)

## 2022-08-07 MED ORDER — CIPROFLOXACIN HCL 500 MG PO TABS: 500.0000 mg | ORAL_TABLET | Freq: Two times a day (BID) | ORAL | 0 refills | Status: AC

## 2022-08-07 MED ORDER — METFORMIN HCL 1000 MG PO TABS: 1000.0000 mg | ORAL_TABLET | Freq: Every day | ORAL | 0 refills | Status: DC

## 2022-08-07 MED ORDER — SENNOSIDES-DOCUSATE SODIUM 8.6-50 MG PO TABS: 1.0000 | ORAL_TABLET | Freq: Two times a day (BID) | ORAL | 0 refills | Status: AC

## 2022-08-07 MED ORDER — POLYETHYLENE GLYCOL 3350 17 G PO PACK: 17.0000 g | PACK | Freq: Two times a day (BID) | ORAL | 0 refills | Status: AC

## 2022-08-07 NOTE — Discharge Summary (Signed)
Physician Discharge Summary   Patient: Nancy [name] MRN: [id number] DOB: 07-31-1940  Admit date:     08/03/2022  Discharge date: 08/07/22  Discharge Physician: Alba Cory   PCP: Hillery Aldo, NP   Recommendations at discharge:    Follow up resolution Bacteremia.  Continue with Bowel Regimen.   Discharge Diagnoses: Principal Problem:   Sepsis secondary to UTI [facility]) Active Problems:   Essential hypertension, benign   CAD (coronary artery disease), native coronary artery   OSA on CPAP   Type 2 diabetes mellitus with stage 3b chronic kidney disease, without long-term current use of insulin (HCC)  Resolved Problems:   * No resolved hospital problems. *  Hospital Course: 82 year old with past medical history significant for hypertension, diabetes type 2, CAD, OSA who presents complaining of fever chills, lower abdominal pain, nausea and fatigue. Evaluation in the ED she was found to be febrile temperature 39.1, mildly tachycardic, lactic acid 2.1 subsequently increased to 1. Patient was admitted with sepsis and bacteremia secondary to E. Coli   Assessment and Plan: 1-Sepsis secondary to UTI E. coli bacteremia: Presents with fever, tachycardia, tachypnea, lactic acidosis at 3.0, UA with 6-10 white blood cell, positive nitrate. Continue with IV ceftriaxone, IV fluids.  Blood culture: grew E coli.  Urine culture not send.  Suspect Bacteremia from presume UTI.  CT abdomen pelvis negative.  Discharge on Cipro for 10 days.   2-CAD: Continue with aspirin, statins.    Diabetes type 2: Hemoglobin A1c 6.3 in January 2024  Resume home regimen at discharge.   Hypertension: On metoprolol. She takes 12.5 mg BID.  Continue with Norvasc.    OSA: Continue with CPAP at bedtime   Hypokalemia; replete orally.  Constipation; Started  Miralax. Dulcolax. Had BM           Consultants: None Procedures performed: None Disposition: Home Diet recommendation:  Discharge  Diet Orders (From admission, onward)     Start     Ordered   08/07/22 0000  Diet - low sodium heart healthy        08/07/22 0942           Cardiac diet DISCHARGE MEDICATION: Allergies as of 08/07/2022       Reactions   Ketorolac Tromethamine Swelling   10/02/15 - given Toradol + morphine and RN noted tongue swelling - given Epi + Benadryl Has taken ibuprofen in past w/o problem Other reaction(s): Unknown   Morphine Swelling   Face and tongue swelling   Morphine Sulfate    Other reaction(s): Unknown   Codeine Other (See Comments)   headache Other reaction(s): Unknown        Medication List     TAKE these medications    allopurinol 300 MG tablet Commonly known as: ZYLOPRIM Take 300 mg by mouth daily.   amLODipine 5 MG tablet Commonly known as: NORVASC Take 5 mg by mouth daily.   aspirin EC 81 MG tablet Take 81 mg by mouth daily.   atorvastatin 10 MG tablet Commonly known as: LIPITOR Take 1 tablet by mouth once daily   CareSens N Glucose Test test strip Generic drug: glucose blood 1 each by Other route daily in the afternoon. Use as instructed   ciprofloxacin 500 MG tablet Commonly known as: CIPRO Take 1 tablet (500 mg total) by mouth 2 (two) times daily for 10 days.   colchicine 0.6 MG tablet Take 0.6 mg by mouth daily.   dapagliflozin propanediol 10 MG Tabs tablet Commonly known  asMarcelline Deist Take 1 tablet (10 mg total) by mouth daily before breakfast.   furosemide 20 MG tablet Commonly known as: LASIX Take 1 tablet (20 mg total) by mouth daily as needed (for weight gain of >3lbs in 1 day or >5lbs in 1 week).   gabapentin 300 MG capsule Commonly known as: NEURONTIN Take 600 mg by mouth 3 (three) times daily.   glipiZIDE 5 MG tablet Commonly known as: GLUCOTROL Take 1 tablet (5 mg total) by mouth 2 (two) times daily before a meal.   losartan 25 MG tablet Commonly known as: COZAAR Take 25 mg by mouth at bedtime.   metFORMIN 1000 MG  tablet Commonly known as: GLUCOPHAGE Take 1 tablet (1,000 mg total) by mouth daily with breakfast. Start taking on: August 09, 2022 What changed: These instructions start on August 09, 2022. If you are unsure what to do until then, ask your doctor or other care provider.   metoprolol tartrate 50 MG tablet Commonly known as: LOPRESSOR Take 0.5 tablets (25 mg total) by mouth 2 (two) times daily.   Multiple Vitamins tablet Take 1 tablet by mouth daily.   omeprazole 20 MG capsule Commonly known as: PRILOSEC Take 20 mg by mouth daily as needed (acid reflux).   OneTouch Verio IQ System w/Device Kit 1 kit by Does not apply route 2 (two) times daily. E11.65   polyethylene glycol 17 g packet Commonly known as: MIRALAX / GLYCOLAX Take 17 g by mouth 2 (two) times daily.   Rybelsus 7 MG Tabs Generic drug: Semaglutide Take 1 tablet (7 mg total) by mouth daily.   senna-docusate 8.6-50 MG tablet Commonly known as: Senokot-S Take 1 tablet by mouth 2 (two) times daily.   traZODone 50 MG tablet Commonly known as: DESYREL Take by mouth.        Discharge Exam: Filed Weights   08/03/22 1656 08/04/22 0950 08/05/22 0500  Weight: 80.3 kg 82.3 kg 81 kg   General; NAD  Condition at discharge: stable  The results of significant diagnostics from this hospitalization (including imaging, microbiology, ancillary and laboratory) are listed below for reference.   Imaging Studies: CT ABDOMEN PELVIS W CONTRAST  Result Date: 08/06/2022 CLINICAL DATA:  Sepsis, abdominal pain and urinary tract infection. EXAM: CT ABDOMEN AND PELVIS WITH CONTRAST TECHNIQUE: Multidetector CT imaging of the abdomen and pelvis was performed using the standard protocol following bolus administration of intravenous contrast. RADIATION DOSE REDUCTION: This exam was performed according to the departmental dose-optimization program which includes automated exposure control, adjustment of the mA and/or kV according to patient size  and/or use of iterative reconstruction technique. CONTRAST:  OMNIPAQUE IOHEXOL 300 MG/ML  SOLN COMPARISON:  04/28/2021 FINDINGS: Lower chest: No acute abnormality.  Small hiatal hernia. Hepatobiliary: No focal liver abnormality is seen. Status post cholecystectomy. No biliary dilatation. Pancreas: Unremarkable. No pancreatic ductal dilatation or surrounding inflammatory changes. Spleen: No splenic injury or perisplenic hematoma. Adrenals/Urinary Tract: No adrenal masses. Stable bilateral renal cortical scarring. No evidence of hydronephrosis, renal abscess or calculi. The bladder is unremarkable. Stomach/Bowel: Bowel shows no evidence of obstruction, ileus, inflammation or lesion. The appendix is surgically absent. No free intraperitoneal air. Vascular/Lymphatic: Mild atherosclerosis of the abdominal aorta without aneurysm. No lymphadenopathy identified. Reproductive: Status post hysterectomy. No adnexal masses. Other: Stable midline upper abdominal wall ventral defect containing fat. No abdominopelvic ascites. Musculoskeletal: Stable appearance status post prior posterior fusion at L4-5 and L5-S1. IMPRESSION: 1. No acute findings in the abdomen or pelvis. 2. Stable bilateral  renal cortical scarring. 3. Small hiatal hernia. 4. Aortic atherosclerosis. 5. Stable upper abdominal wall ventral hernia containing fat. Electronically Signed   By: Irish Lack M.D.   On: 08/06/2022 17:02   DG Abd 1 View  Result Date: 08/05/2022 CLINICAL DATA:  Constipation EXAM: ABDOMEN - 1 VIEW COMPARISON:  None Available. FINDINGS: The bowel gas pattern is normal. There is a moderate-to-large amount of stool throughout the colon. There surgical clips in the right upper quadrant. No suspicious calcifications are identified. Lower lumbar fusion hardware is present. Lung bases are clear. IMPRESSION: Moderate-to-large amount of stool throughout the colon. No evidence of bowel obstruction. Electronically Signed   By: Darliss Cheney  M.D.   On: 08/05/2022 18:21   DG Chest Port 1 View  Result Date: 08/03/2022 CLINICAL DATA:  Weakness EXAM: PORTABLE CHEST 1 VIEW COMPARISON:  X-ray 07/22/2022 FINDINGS: Calcified and tortuous aorta. No consolidation, pneumothorax or effusion. No edema. Overlapping cardiac leads. Degenerative changes of the spine. Surgical clips in the left axillary region. IMPRESSION: No acute cardiopulmonary disease. Electronically Signed   By: Karen Kays M.D.   On: 08/03/2022 12:17   DG Chest Port 1 View  Result Date: 07/22/2022 CLINICAL DATA:  Syncope EXAM: PORTABLE CHEST 1 VIEW COMPARISON:  Previous studies including the examination of 04/28/2021 FINDINGS: Transverse diameter of heart is increased. Low lung volumes. Increased interstitial markings are seen in parahilar regions and lower lung fields. There is no focal pulmonary consolidation. There is no pleural effusion or pneumothorax. Degenerative changes are noted in both shoulders. Surgical clips are seen in left chest wall. IMPRESSION: Increased interstitial markings are seen in both parahilar regions and lower lung fields suggesting interstitial edema or interstitial pneumonia. Part of this finding may be due to low lung volumes. Electronically Signed   By: Ernie Avena M.D.   On: 07/22/2022 14:34   XR Knee 1-2 Views Left  Result Date: 07/13/2022 AP lateral radiographs left knee reviewed.  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  No acute fracture.  No loose bodies.  XR Knee 1-2 Views Right  Result Date: 07/13/2022 AP lateral radiographs right knee reviewed.  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  No acute fracture.   Microbiology: Results for orders placed or performed during the hospital encounter of 08/03/22  Resp panel by RT-PCR (RSV, Flu A&B, Covid) Anterior Nasal Swab     Status: None   Collection Time: 08/03/22 10:31 AM   Specimen: Anterior Nasal Swab  Result Value Ref  Range Status   SARS Coronavirus 2 by RT PCR NEGATIVE NEGATIVE Final    Comment: (NOTE) SARS-CoV-2 target nucleic acids are NOT DETECTED.  The SARS-CoV-2 RNA is generally detectable in upper respiratory specimens during the acute phase of infection. The lowest concentration of SARS-CoV-2 viral copies this assay can detect is 138 copies/mL. A negative result does not preclude SARS-Cov-2 infection and should not be used as the sole basis for treatment or other patient management decisions. A negative result may occur with  improper specimen collection/handling, submission of specimen other than nasopharyngeal swab, presence of viral mutation(s) within the areas targeted by this assay, and inadequate number of viral copies(<138 copies/mL). A negative result must be combined with clinical observations, patient history, and epidemiological information. The expected result is Negative.  Fact Sheet for Patients:  BloggerCourse.com  Fact Sheet for Healthcare Providers:  SeriousBroker.it  This test is no t yet approved or cleared by the [place] and  has been authorized for detection and/or diagnosis of SARS-CoV-2 by FDA under an Emergency Use Authorization (EUA). This EUA will remain  in effect (meaning this test can be used) for the duration of the COVID-19 declaration under Section 564(b)(1) of the Act, 21 U.S.C.section 360bbb-3(b)(1), unless the authorization is terminated  or revoked sooner.       Influenza A by PCR NEGATIVE NEGATIVE Final   Influenza B by PCR NEGATIVE NEGATIVE Final    Comment: (NOTE) The Xpert Xpress SARS-CoV-2/FLU/RSV plus assay is intended as an aid in the diagnosis of influenza from Nasopharyngeal swab specimens and should not be used as a sole basis for treatment. Nasal washings and aspirates are unacceptable for Xpert Xpress SARS-CoV-2/FLU/RSV testing.  Fact Sheet for  Patients: BloggerCourse.com  Fact Sheet for Healthcare Providers: SeriousBroker.it  This test is not yet approved or cleared by the [place] FDA and has been authorized for detection and/or diagnosis of SARS-CoV-2 by FDA under an Emergency Use Authorization (EUA). This EUA will remain in effect (meaning this test can be used) for the duration of the COVID-19 declaration under Section 564(b)(1) of the Act, 21 U.S.C. section 360bbb-3(b)(1), unless the authorization is terminated or revoked.     Resp Syncytial Virus by PCR NEGATIVE NEGATIVE Final    Comment: (NOTE) Fact Sheet for Patients: BloggerCourse.com  Fact Sheet for Healthcare Providers: SeriousBroker.it  This test is not yet approved or cleared by the [place] FDA and has been authorized for detection and/or diagnosis of SARS-CoV-2 by FDA under an Emergency Use Authorization (EUA). This EUA will remain in effect (meaning this test can be used) for the duration of the COVID-19 declaration under Section 564(b)(1) of the Act, 21 U.S.C. section 360bbb-3(b)(1), unless the authorization is terminated or revoked.  Performed at [facility], [address] [place], [place] [address]   Blood Culture (routine x 2)     Status: Abnormal   Collection Time: 08/03/22 10:49 AM   Specimen: BLOOD RIGHT ARM  Result Value Ref Range Status   Specimen Description   Final    BLOOD RIGHT ARM Performed at [facility], [address], [place], [place] [address]    Special Requests   Final    Blood Culture adequate volume BOTTLES DRAWN AEROBIC AND ANAEROBIC Performed at [facility], [address] [place], [place] [address]    Culture  Setup Time   Final    GRAM NEGATIVE RODS IN BOTH AEROBIC AND ANAEROBIC BOTTLES CRITICAL RESULT CALLED TO, READ BACK BY AND VERIFIED WITH: Spero Curb [phone] @ 2318 FH Performed at [facility], [address], [place], [place] [address]    Culture ESCHERICHIA COLI (A)  Final   Report Status 08/06/2022 FINAL  Final   Organism ID, Bacteria ESCHERICHIA COLI  Final      Susceptibility   Escherichia coli - MIC*    AMPICILLIN >=32 RESISTANT Resistant     CEFEPIME <=0.12 SENSITIVE Sensitive     CEFTAZIDIME <=1 SENSITIVE Sensitive     CEFTRIAXONE <=0.25 SENSITIVE Sensitive     CIPROFLOXACIN <=0.25 SENSITIVE Sensitive     GENTAMICIN <=1 SENSITIVE Sensitive     IMIPENEM <=0.25 SENSITIVE Sensitive     TRIMETH/SULFA >=320 RESISTANT Resistant     AMPICILLIN/SULBACTAM >=32 RESISTANT Resistant     PIP/TAZO 64 INTERMEDIATE Intermediate     * ESCHERICHIA COLI  Blood Culture ID Panel (Reflexed)     Status: Abnormal   Collection Time: 08/03/22  10:49 AM  Result Value Ref Range Status   Enterococcus faecalis NOT DETECTED NOT DETECTED Final   Enterococcus Faecium NOT DETECTED NOT DETECTED Final   Listeria monocytogenes NOT DETECTED NOT DETECTED Final   Staphylococcus species NOT DETECTED NOT DETECTED Final   Staphylococcus aureus (BCID) NOT DETECTED NOT DETECTED Final   Staphylococcus epidermidis NOT DETECTED NOT DETECTED Final   Staphylococcus lugdunensis NOT DETECTED NOT DETECTED Final   Streptococcus species NOT DETECTED NOT DETECTED Final   Streptococcus agalactiae NOT DETECTED NOT DETECTED Final   Streptococcus pneumoniae NOT DETECTED NOT DETECTED Final   Streptococcus pyogenes NOT DETECTED NOT DETECTED Final   A.calcoaceticus-baumannii NOT DETECTED NOT DETECTED Final   Bacteroides fragilis NOT DETECTED NOT DETECTED Final   Enterobacterales DETECTED (A) NOT DETECTED Final    Comment: Enterobacterales represent a large order of gram negative bacteria, not a single organism. CRITICAL RESULT CALLED TO, READ BACK BY AND VERIFIED WITH: PHARMD EJean Rosenthal [phone] @ 2318 FH    Enterobacter cloacae complex NOT DETECTED NOT DETECTED  Final   Escherichia coli DETECTED (A) NOT DETECTED Final    Comment: CRITICAL RESULT CALLED TO, READ BACK BY AND VERIFIED WITH: PHARMD EJean Rosenthal [phone] @ 2318 FH    Klebsiella aerogenes NOT DETECTED NOT DETECTED Final   Klebsiella oxytoca NOT DETECTED NOT DETECTED Final   Klebsiella pneumoniae NOT DETECTED NOT DETECTED Final   Proteus species NOT DETECTED NOT DETECTED Final   Salmonella species NOT DETECTED NOT DETECTED Final   Serratia marcescens NOT DETECTED NOT DETECTED Final   Haemophilus influenzae NOT DETECTED NOT DETECTED Final   Neisseria meningitidis NOT DETECTED NOT DETECTED Final   Pseudomonas aeruginosa NOT DETECTED NOT DETECTED Final   Stenotrophomonas maltophilia NOT DETECTED NOT DETECTED Final   Candida albicans NOT DETECTED NOT DETECTED Final   Candida auris NOT DETECTED NOT DETECTED Final   Candida glabrata NOT DETECTED NOT DETECTED Final   Candida krusei NOT DETECTED NOT DETECTED Final   Candida parapsilosis NOT DETECTED NOT DETECTED Final   Candida tropicalis NOT DETECTED NOT DETECTED Final   Cryptococcus neoformans/gattii NOT DETECTED NOT DETECTED Final   CTX-M ESBL NOT DETECTED NOT DETECTED Final   Carbapenem resistance IMP NOT DETECTED NOT DETECTED Final   Carbapenem resistance KPC NOT DETECTED NOT DETECTED Final   Carbapenem resistance NDM NOT DETECTED NOT DETECTED Final   Carbapenem resist OXA 48 LIKE NOT DETECTED NOT DETECTED Final   Carbapenem resistance VIM NOT DETECTED NOT DETECTED Final    Comment: Performed at [facility], [address], [place], [place] [address]  Blood Culture (routine x 2)     Status: Abnormal   Collection Time: 08/03/22 10:52 AM   Specimen: BLOOD LEFT FOREARM  Result Value Ref Range Status   Specimen Description   Final    BLOOD LEFT FOREARM Performed at [facility], [address] [place], [place] [address]    Special Requests   Final    BOTTLES DRAWN AEROBIC AND ANAEROBIC Blood Culture adequate  volume Performed at [facility], [address] [place], [place] [address]    Culture  Setup Time   Final    GRAM NEGATIVE RODS IN BOTH AEROBIC AND ANAEROBIC BOTTLES CRITICAL RESULT CALLED TO, READ BACK BY AND VERIFIED WITH: PHARMD EJean Rosenthal [phone] @ 2318 FH    Culture (A)  Final    ESCHERICHIA COLI SUSCEPTIBILITIES PERFORMED ON PREVIOUS CULTURE WITHIN THE LAST 5 DAYS. Performed at [facility], [address] [place], [place] [address]    Report Status 08/06/2022 FINAL  Final  MRSA Next Gen by PCR, Nasal     Status: None   Collection Time: 08/03/22  6:19 PM   Specimen: Nasal Mucosa; Nasal Swab  Result Value Ref Range Status   MRSA by PCR Next Gen NOT DETECTED NOT DETECTED Final    Comment: (NOTE) The GeneXpert MRSA Assay (FDA approved for NASAL specimens only), is one component of a comprehensive MRSA colonization surveillance program. It is not intended to diagnose MRSA infection nor to guide or monitor treatment for MRSA infections. Test performance is not FDA approved in patients less than 75 years old. Performed at [facility], [address], [place], [place] [address]     Labs: CBC: Recent Labs  Lab 08/03/22 1052 08/04/22 0339 08/05/22 0332 08/06/22 0357  WBC 7.9 9.2 6.8 6.0  NEUTROABS 6.7  --   --   --   HGB 11.6* 10.4* 10.3* 9.9*  HCT 35.1* 31.3* 31.9* 30.7*  MCV 86.9 87.7 89.6 88.5  PLT 147* 119* 112* 128*   Basic Metabolic Panel: Recent Labs  Lab 08/03/22 1052 08/04/22 0339 08/05/22 0332 08/06/22 0357  NA 140 139 137 136  K 3.8 3.0* 3.5 3.4*  CL 107 107 104 101  CO2 22 24 23 25   GLUCOSE 167* 136* 134* 140*  BUN 12 11 10 14   CREATININE 0.91 0.80 0.75 0.91  CALCIUM 9.2 8.2* 8.2* 8.5*   Liver Function Tests: Recent Labs  Lab 08/03/22 1052  AST 25  ALT 24  ALKPHOS 103  BILITOT 1.5*  PROT 7.6  ALBUMIN 4.0   CBG: Recent Labs  Lab 08/06/22 1155 08/06/22 1648 08/06/22 2102 08/07/22 0728  08/07/22 1132  GLUCAP 263* 172* 235* 143* 249*    Discharge time spent: greater than 30 minutes.  Signed: Alba Cory, MD [facility] 08/07/2022

## 2022-08-07 NOTE — Progress Notes (Signed)
Patient discharge to home via w/c with her daughter. Home via POV, PIV removed as documented. AVS reviewed w/ patient and daughter- teach back method used- both verbalized an understanding, no other questions at this time. PT noted ambulation was easier w/ a walker in the hospital. Pt stated she had one at home, but is not sure where it is. Pt has an appt w/ her pcp  on 7/31 & will follow up with her PCP  to see if it is possible to get a walker for home use.

## 2022-08-07 NOTE — Discharge Summary (Incomplete)
Physician Discharge Summary   Patient: Nancy [name] MRN: [id number] DOB: [birth year]  Admit date:     08/03/2022  Discharge date: 08/07/22  Discharge Physician: Alba Cory   PCP: Hillery Aldo, NP   Recommendations at discharge:  {Tip this will not be part of the note when signed- Example include specific recommendations for outpatient follow-up, pending tests to follow-up on. (Optional):26781}  ***  Discharge Diagnoses: Principal Problem:   Sepsis secondary to UTI [facility]) Active Problems:   Essential hypertension, benign   CAD (coronary artery disease), native coronary artery   OSA on CPAP   Type 2 diabetes mellitus with stage 3b chronic kidney disease, without long-term current use of insulin (HCC)  Resolved Problems:   * No resolved hospital problems. *  Hospital Course: 82 year old with past medical history significant for hypertension, diabetes type 2, CAD, OSA who presents complaining of fever chills, lower abdominal pain, nausea and fatigue. Evaluation in the ED she was found to be febrile temperature 39.1, mildly tachycardic, lactic acid 2.1 subsequently increased to 1. Patient was admitted with sepsis and bacteremia secondary to E. Coli   Assessment and Plan: 1-Sepsis secondary to UTI E. coli bacteremia: Presents with fever, tachycardia, tachypnea, lactic acidosis at 3.0, UA with 6-10 white blood cell, positive nitrate. Continue with IV ceftriaxone, IV fluids.  Blood culture: grew E coli.  Urine culture not send.  Suspect Bacteremia from presume UTI.  Report abdominal pain on and off at home. Will proceed with CT abdomen pelvis.    2-CAD: Continue with aspirin, statins.    Diabetes type 2: Hemoglobin A1c 6.3 in January 2024   Hypertension: On metoprolol. She takes 12.5 mg BID.  Continue with Norvasc.    OSA: Continue with CPAP at bedtime   Hypokalemia; replete orally.  Constipation; Started  Miralax. Dulcolax.      Estimated body mass index is  32.66 kg/m as calculated from the following:   Height as of this encounter: 5\' 2"  (1.575 m).   Weight as of this encounter: 81 kg.     {Tip this will not be part of the note when signed Body mass index is 32.66 kg/m. , ,  (Optional):26781}  {(NOTE) Pain control PDMP Statment (Optional):26782} Consultants: *** Procedures performed: ***  Disposition: {Plan; Disposition:26390} Diet recommendation:  Discharge Diet Orders (From admission, onward)     Start     Ordered   08/07/22 0000  Diet - low sodium heart healthy        08/07/22 0942           {Diet_Plan:26776} DISCHARGE MEDICATION: Allergies as of 08/07/2022       Reactions   Ketorolac Tromethamine Swelling   10/02/15 - given Toradol + morphine and RN noted tongue swelling - given Epi + Benadryl Has taken ibuprofen in past w/o problem Other reaction(s): Unknown   Morphine Swelling   Face and tongue swelling   Morphine Sulfate    Other reaction(s): Unknown   Codeine Other (See Comments)   headache Other reaction(s): Unknown        Medication List     TAKE these medications    allopurinol 300 MG tablet Commonly known as: ZYLOPRIM Take 300 mg by mouth daily.   amLODipine 5 MG tablet Commonly known as: NORVASC Take 5 mg by mouth daily.   aspirin EC 81 MG tablet Take 81 mg by mouth daily.   atorvastatin 10 MG tablet Commonly known as: LIPITOR Take 1 tablet by mouth once daily  CareSens N Glucose Test test strip Generic drug: glucose blood 1 each by Other route daily in the afternoon. Use as instructed   ciprofloxacin 500 MG tablet Commonly known as: CIPRO Take 1 tablet (500 mg total) by mouth 2 (two) times daily for 10 days.   colchicine 0.6 MG tablet Take 0.6 mg by mouth daily.   dapagliflozin propanediol 10 MG Tabs tablet Commonly known as: Farxiga Take 1 tablet (10 mg total) by mouth daily before breakfast.   furosemide 20 MG tablet Commonly known as: LASIX Take 1 tablet (20 mg total) by  mouth daily as needed (for weight gain of >3lbs in 1 day or >5lbs in 1 week).   gabapentin 300 MG capsule Commonly known as: NEURONTIN Take 600 mg by mouth 3 (three) times daily.   glipiZIDE 5 MG tablet Commonly known as: GLUCOTROL Take 1 tablet (5 mg total) by mouth 2 (two) times daily before a meal.   losartan 25 MG tablet Commonly known as: COZAAR Take 25 mg by mouth at bedtime.   metFORMIN 1000 MG tablet Commonly known as: GLUCOPHAGE Take 1 tablet (1,000 mg total) by mouth daily with breakfast. Start taking on: August 09, 2022 What changed: These instructions start on August 09, 2022. If you are unsure what to do until then, ask your doctor or other care provider.   metoprolol tartrate 50 MG tablet Commonly known as: LOPRESSOR Take 0.5 tablets (25 mg total) by mouth 2 (two) times daily.   Multiple Vitamins tablet Take 1 tablet by mouth daily.   omeprazole 20 MG capsule Commonly known as: PRILOSEC Take 20 mg by mouth daily as needed (acid reflux).   OneTouch Verio IQ System w/Device Kit 1 kit by Does not apply route 2 (two) times daily. E11.65   polyethylene glycol 17 g packet Commonly known as: MIRALAX / GLYCOLAX Take 17 g by mouth 2 (two) times daily.   Rybelsus 7 MG Tabs Generic drug: Semaglutide Take 1 tablet (7 mg total) by mouth daily.   senna-docusate 8.6-50 MG tablet Commonly known as: Senokot-S Take 1 tablet by mouth 2 (two) times daily.   traZODone 50 MG tablet Commonly known as: DESYREL Take by mouth.        Discharge Exam: Filed Weights   08/03/22 1656 08/04/22 0950 08/05/22 0500  Weight: 80.3 kg 82.3 kg 81 kg   ***  Condition at discharge: {DC Condition:26389}  The results of significant diagnostics from this hospitalization (including imaging, microbiology, ancillary and laboratory) are listed below for reference.   Imaging Studies: CT ABDOMEN PELVIS W CONTRAST  Result Date: 08/06/2022 CLINICAL DATA:  Sepsis, abdominal pain and urinary  tract infection. EXAM: CT ABDOMEN AND PELVIS WITH CONTRAST TECHNIQUE: Multidetector CT imaging of the abdomen and pelvis was performed using the standard protocol following bolus administration of intravenous contrast. RADIATION DOSE REDUCTION: This exam was performed according to the departmental dose-optimization program which includes automated exposure control, adjustment of the mA and/or kV according to patient size and/or use of iterative reconstruction technique. CONTRAST:  OMNIPAQUE IOHEXOL 300 MG/ML  SOLN COMPARISON:  04/28/2021 FINDINGS: Lower chest: No acute abnormality.  Small hiatal hernia. Hepatobiliary: No focal liver abnormality is seen. Status post cholecystectomy. No biliary dilatation. Pancreas: Unremarkable. No pancreatic ductal dilatation or surrounding inflammatory changes. Spleen: No splenic injury or perisplenic hematoma. Adrenals/Urinary Tract: No adrenal masses. Stable bilateral renal cortical scarring. No evidence of hydronephrosis, renal abscess or calculi. The bladder is unremarkable. Stomach/Bowel: Bowel shows no evidence of obstruction, ileus,  inflammation or lesion. The appendix is surgically absent. No free intraperitoneal air. Vascular/Lymphatic: Mild atherosclerosis of the abdominal aorta without aneurysm. No lymphadenopathy identified. Reproductive: Status post hysterectomy. No adnexal masses. Other: Stable midline upper abdominal wall ventral defect containing fat. No abdominopelvic ascites. Musculoskeletal: Stable appearance status post prior posterior fusion at L4-5 and L5-S1. IMPRESSION: 1. No acute findings in the abdomen or pelvis. 2. Stable bilateral renal cortical scarring. 3. Small hiatal hernia. 4. Aortic atherosclerosis. 5. Stable upper abdominal wall ventral hernia containing fat. Electronically Signed   By: Irish Lack M.D.   On: 08/06/2022 17:02   DG Abd 1 View  Result Date: 08/05/2022 CLINICAL DATA:  Constipation EXAM: ABDOMEN - 1 VIEW COMPARISON:   None Available. FINDINGS: The bowel gas pattern is normal. There is a moderate-to-large amount of stool throughout the colon. There surgical clips in the right upper quadrant. No suspicious calcifications are identified. Lower lumbar fusion hardware is present. Lung bases are clear. IMPRESSION: Moderate-to-large amount of stool throughout the colon. No evidence of bowel obstruction. Electronically Signed   By: Darliss Cheney M.D.   On: 08/05/2022 18:21   DG Chest Port 1 View  Result Date: 08/03/2022 CLINICAL DATA:  Weakness EXAM: PORTABLE CHEST 1 VIEW COMPARISON:  X-ray 07/22/2022 FINDINGS: Calcified and tortuous aorta. No consolidation, pneumothorax or effusion. No edema. Overlapping cardiac leads. Degenerative changes of the spine. Surgical clips in the left axillary region. IMPRESSION: No acute cardiopulmonary disease. Electronically Signed   By: Karen Kays M.D.   On: 08/03/2022 12:17   DG Chest Port 1 View  Result Date: 07/22/2022 CLINICAL DATA:  Syncope EXAM: PORTABLE CHEST 1 VIEW COMPARISON:  Previous studies including the examination of 04/28/2021 FINDINGS: Transverse diameter of heart is increased. Low lung volumes. Increased interstitial markings are seen in parahilar regions and lower lung fields. There is no focal pulmonary consolidation. There is no pleural effusion or pneumothorax. Degenerative changes are noted in both shoulders. Surgical clips are seen in left chest wall. IMPRESSION: Increased interstitial markings are seen in both parahilar regions and lower lung fields suggesting interstitial edema or interstitial pneumonia. Part of this finding may be due to low lung volumes. Electronically Signed   By: Ernie Avena M.D.   On: 07/22/2022 14:34   XR Knee 1-2 Views Left  Result Date: 07/13/2022 AP lateral radiographs left knee reviewed.  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  No acute fracture.  No loose bodies.  XR Knee 1-2 Views Right  Result Date:  07/13/2022 AP lateral radiographs right knee reviewed.  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  No acute fracture.   Microbiology: Results for orders placed or performed during the hospital encounter of 08/03/22  Resp panel by RT-PCR (RSV, Flu A&B, Covid) Anterior Nasal Swab     Status: None   Collection Time: 08/03/22 10:31 AM   Specimen: Anterior Nasal Swab  Result Value Ref Range Status   SARS Coronavirus 2 by RT PCR NEGATIVE NEGATIVE Final    Comment: (NOTE) SARS-CoV-2 target nucleic acids are NOT DETECTED.  The SARS-CoV-2 RNA is generally detectable in upper respiratory specimens during the acute phase of infection. The lowest concentration of SARS-CoV-2 viral copies this assay can detect is 138 copies/mL. A negative result does not preclude SARS-Cov-2 infection and should not be used as the sole basis for treatment or other patient management decisions. A negative result may occur with  improper specimen collection/handling, submission of specimen other than nasopharyngeal swab,  presence of viral mutation(s) within the areas targeted by this assay, and inadequate number of viral copies(<138 copies/mL). A negative result must be combined with clinical observations, patient history, and epidemiological information. The expected result is Negative.  Fact Sheet for Patients:  BloggerCourse.com  Fact Sheet for Healthcare Providers:  SeriousBroker.it  This test is no t yet approved or cleared by the [place] FDA and  has been authorized for detection and/or diagnosis of SARS-CoV-2 by FDA under an Emergency Use Authorization (EUA). This EUA will remain  in effect (meaning this test can be used) for the duration of the COVID-19 declaration under Section 564(b)(1) of the Act, 21 U.S.C.section 360bbb-3(b)(1), unless the authorization is terminated  or revoked sooner.        Influenza A by PCR NEGATIVE NEGATIVE Final   Influenza B by PCR NEGATIVE NEGATIVE Final    Comment: (NOTE) The Xpert Xpress SARS-CoV-2/FLU/RSV plus assay is intended as an aid in the diagnosis of influenza from Nasopharyngeal swab specimens and should not be used as a sole basis for treatment. Nasal washings and aspirates are unacceptable for Xpert Xpress SARS-CoV-2/FLU/RSV testing.  Fact Sheet for Patients: BloggerCourse.com  Fact Sheet for Healthcare Providers: SeriousBroker.it  This test is not yet approved or cleared by the [place] FDA and has been authorized for detection and/or diagnosis of SARS-CoV-2 by FDA under an Emergency Use Authorization (EUA). This EUA will remain in effect (meaning this test can be used) for the duration of the COVID-19 declaration under Section 564(b)(1) of the Act, 21 U.S.C. section 360bbb-3(b)(1), unless the authorization is terminated or revoked.     Resp Syncytial Virus by PCR NEGATIVE NEGATIVE Final    Comment: (NOTE) Fact Sheet for Patients: BloggerCourse.com  Fact Sheet for Healthcare Providers: SeriousBroker.it  This test is not yet approved or cleared by the [place] FDA and has been authorized for detection and/or diagnosis of SARS-CoV-2 by FDA under an Emergency Use Authorization (EUA). This EUA will remain in effect (meaning this test can be used) for the duration of the COVID-19 declaration under Section 564(b)(1) of the Act, 21 U.S.C. section 360bbb-3(b)(1), unless the authorization is terminated or revoked.  Performed at [facility], [address], [place], [place] [address]   Blood Culture (routine x 2)     Status: Abnormal   Collection Time: 08/03/22 10:49 AM   Specimen: BLOOD RIGHT ARM  Result Value Ref Range Status   Specimen Description   Final    BLOOD RIGHT ARM Performed at [facility], [address], [place], [place] [address]    Special Requests   Final    Blood Culture adequate volume BOTTLES DRAWN AEROBIC AND ANAEROBIC Performed at [facility], [address] [place], [place] [address]    Culture  Setup Time   Final    GRAM NEGATIVE RODS IN BOTH AEROBIC AND ANAEROBIC BOTTLES CRITICAL RESULT CALLED TO, READ BACK BY AND VERIFIED WITH: Spero Curb [phone] @ 2318 FH Performed at [facility], [address], [place], [place] [address]    Culture ESCHERICHIA COLI (A)  Final   Report Status 08/06/2022 FINAL  Final   Organism ID, Bacteria ESCHERICHIA COLI  Final      Susceptibility   Escherichia coli - MIC*    AMPICILLIN >=32 RESISTANT Resistant     CEFEPIME <=0.12 SENSITIVE Sensitive     CEFTAZIDIME <=1 SENSITIVE Sensitive     CEFTRIAXONE <=0.25 SENSITIVE Sensitive  CIPROFLOXACIN <=0.25 SENSITIVE Sensitive     GENTAMICIN <=1 SENSITIVE Sensitive     IMIPENEM <=0.25 SENSITIVE Sensitive     TRIMETH/SULFA >=320 RESISTANT Resistant     AMPICILLIN/SULBACTAM >=32 RESISTANT Resistant     PIP/TAZO 64 INTERMEDIATE Intermediate     * ESCHERICHIA COLI  Blood Culture ID Panel (Reflexed)     Status: Abnormal   Collection Time: 08/03/22 10:49 AM  Result Value Ref Range Status   Enterococcus faecalis NOT DETECTED NOT DETECTED Final   Enterococcus Faecium NOT DETECTED NOT DETECTED Final   Listeria monocytogenes NOT DETECTED NOT DETECTED Final   Staphylococcus species NOT DETECTED NOT DETECTED Final   Staphylococcus aureus (BCID) NOT DETECTED NOT DETECTED Final   Staphylococcus epidermidis NOT DETECTED NOT DETECTED Final   Staphylococcus lugdunensis NOT DETECTED NOT DETECTED Final   Streptococcus species NOT DETECTED NOT DETECTED Final   Streptococcus agalactiae NOT DETECTED NOT DETECTED Final   Streptococcus pneumoniae NOT DETECTED NOT DETECTED Final   Streptococcus pyogenes NOT DETECTED NOT DETECTED Final    A.calcoaceticus-baumannii NOT DETECTED NOT DETECTED Final   Bacteroides fragilis NOT DETECTED NOT DETECTED Final   Enterobacterales DETECTED (A) NOT DETECTED Final    Comment: Enterobacterales represent a large order of gram negative bacteria, not a single organism. CRITICAL RESULT CALLED TO, READ BACK BY AND VERIFIED WITH: PHARMD EJean Rosenthal [phone] @ 2318 FH    Enterobacter cloacae complex NOT DETECTED NOT DETECTED Final   Escherichia coli DETECTED (A) NOT DETECTED Final    Comment: CRITICAL RESULT CALLED TO, READ BACK BY AND VERIFIED WITH: PHARMD EJean Rosenthal [phone] @ 2318 FH    Klebsiella aerogenes NOT DETECTED NOT DETECTED Final   Klebsiella oxytoca NOT DETECTED NOT DETECTED Final   Klebsiella pneumoniae NOT DETECTED NOT DETECTED Final   Proteus species NOT DETECTED NOT DETECTED Final   Salmonella species NOT DETECTED NOT DETECTED Final   Serratia marcescens NOT DETECTED NOT DETECTED Final   Haemophilus influenzae NOT DETECTED NOT DETECTED Final   Neisseria meningitidis NOT DETECTED NOT DETECTED Final   Pseudomonas aeruginosa NOT DETECTED NOT DETECTED Final   Stenotrophomonas maltophilia NOT DETECTED NOT DETECTED Final   Candida albicans NOT DETECTED NOT DETECTED Final   Candida auris NOT DETECTED NOT DETECTED Final   Candida glabrata NOT DETECTED NOT DETECTED Final   Candida krusei NOT DETECTED NOT DETECTED Final   Candida parapsilosis NOT DETECTED NOT DETECTED Final   Candida tropicalis NOT DETECTED NOT DETECTED Final   Cryptococcus neoformans/gattii NOT DETECTED NOT DETECTED Final   CTX-M ESBL NOT DETECTED NOT DETECTED Final   Carbapenem resistance IMP NOT DETECTED NOT DETECTED Final   Carbapenem resistance KPC NOT DETECTED NOT DETECTED Final   Carbapenem resistance NDM NOT DETECTED NOT DETECTED Final   Carbapenem resist OXA 48 LIKE NOT DETECTED NOT DETECTED Final   Carbapenem resistance VIM NOT DETECTED NOT DETECTED Final    Comment: Performed at [facility], [address] [place], [place] [address]  Blood Culture (routine x 2)     Status: Abnormal   Collection Time: 08/03/22 10:52 AM   Specimen: BLOOD LEFT FOREARM  Result Value Ref Range Status   Specimen Description   Final    BLOOD LEFT FOREARM Performed at [facility], [address], [place], [place] [address]    Special Requests   Final    BOTTLES DRAWN AEROBIC AND ANAEROBIC Blood Culture adequate volume Performed at [facility], [address] [place], [place] [address]  Culture  Setup Time   Final    GRAM NEGATIVE RODS IN BOTH AEROBIC AND ANAEROBIC BOTTLES CRITICAL RESULT CALLED TO, READ BACK BY AND VERIFIED WITH: PHARMD EJean Rosenthal [phone] @ 2318 FH    Culture (A)  Final    ESCHERICHIA COLI SUSCEPTIBILITIES PERFORMED ON PREVIOUS CULTURE WITHIN THE LAST 5 DAYS. Performed at [facility], [address] [place], [place] [address]    Report Status 08/06/2022 FINAL  Final  MRSA Next Gen by PCR, Nasal     Status: None   Collection Time: 08/03/22  6:19 PM   Specimen: Nasal Mucosa; Nasal Swab  Result Value Ref Range Status   MRSA by PCR Next Gen NOT DETECTED NOT DETECTED Final    Comment: (NOTE) The GeneXpert MRSA Assay (FDA approved for NASAL specimens only), is one component of a comprehensive MRSA colonization surveillance program. It is not intended to diagnose MRSA infection nor to guide or monitor treatment for MRSA infections. Test performance is not FDA approved in patients less than 85 years old. Performed at [facility], [address], [place], [place] [address]     Labs: CBC: Recent Labs  Lab 08/03/22 1052 08/04/22 0339 08/05/22 0332 08/06/22 0357  WBC 7.9 9.2 6.8 6.0  NEUTROABS 6.7  --   --   --   HGB 11.6* 10.4* 10.3* 9.9*  HCT 35.1* 31.3* 31.9* 30.7*  MCV 86.9 87.7 89.6 88.5  PLT 147* 119* 112* 128*   Basic Metabolic Panel: Recent Labs  Lab 08/03/22 1052 08/04/22 0339 08/05/22 0332  08/06/22 0357  NA 140 139 137 136  K 3.8 3.0* 3.5 3.4*  CL 107 107 104 101  CO2 22 24 23 25   GLUCOSE 167* 136* 134* 140*  BUN 12 11 10 14   CREATININE 0.91 0.80 0.75 0.91  CALCIUM 9.2 8.2* 8.2* 8.5*   Liver Function Tests: Recent Labs  Lab 08/03/22 1052  AST 25  ALT 24  ALKPHOS 103  BILITOT 1.5*  PROT 7.6  ALBUMIN 4.0   CBG: Recent Labs  Lab 08/06/22 0752 08/06/22 1155 08/06/22 1648 08/06/22 2102 08/07/22 0728  GLUCAP 125* 263* 172* 235* 143*    Discharge time spent: {LESS THAN/GREATER EXBM:84132} 30 minutes.  Signed: Alba Cory, MD [facility] 08/07/2022

## 2022-08-10 ENCOUNTER — Ambulatory Visit: Payer: 59

## 2022-08-12 ENCOUNTER — Encounter: Payer: 59 | Attending: Physical Medicine and Rehabilitation | Admitting: Physical Medicine and Rehabilitation

## 2022-08-13 ENCOUNTER — Ambulatory Visit: Payer: 59 | Attending: Cardiology

## 2022-08-13 ENCOUNTER — Ambulatory Visit (INDEPENDENT_AMBULATORY_CARE_PROVIDER_SITE_OTHER): Payer: 59 | Admitting: Internal Medicine

## 2022-08-13 VITALS — BP 128/74 | HR 79 | Ht 62.0 in | Wt 176.0 lb

## 2022-08-13 DIAGNOSIS — E7800 Pure hypercholesterolemia, unspecified: Secondary | ICD-10-CM

## 2022-08-13 DIAGNOSIS — E1142 Type 2 diabetes mellitus with diabetic polyneuropathy: Secondary | ICD-10-CM | POA: Diagnosis not present

## 2022-08-13 DIAGNOSIS — N1832 Chronic kidney disease, stage 3b: Secondary | ICD-10-CM | POA: Diagnosis not present

## 2022-08-13 DIAGNOSIS — E1122 Type 2 diabetes mellitus with diabetic chronic kidney disease: Secondary | ICD-10-CM

## 2022-08-13 DIAGNOSIS — E785 Hyperlipidemia, unspecified: Secondary | ICD-10-CM

## 2022-08-13 DIAGNOSIS — Z7984 Long term (current) use of oral hypoglycemic drugs: Secondary | ICD-10-CM

## 2022-08-13 DIAGNOSIS — E1165 Type 2 diabetes mellitus with hyperglycemia: Secondary | ICD-10-CM | POA: Diagnosis not present

## 2022-08-13 MED ORDER — GLIPIZIDE 5 MG PO TABS: 5.0000 mg | ORAL_TABLET | Freq: Every day | ORAL | 3 refills | Status: DC

## 2022-08-13 MED ORDER — CARESENS N GLUCOSE TEST VI STRP: 1.0000 | ORAL_STRIP | Freq: Every day | 3 refills | Status: AC

## 2022-08-13 MED ORDER — DAPAGLIFLOZIN PROPANEDIOL 10 MG PO TABS: 10.0000 mg | ORAL_TABLET | Freq: Every day | ORAL | 3 refills | Status: DC

## 2022-08-13 MED ORDER — METFORMIN HCL 1000 MG PO TABS: 1000.0000 mg | ORAL_TABLET | Freq: Every day | ORAL | 3 refills | Status: DC

## 2022-08-13 NOTE — Patient Instructions (Addendum)
-   Rybelsus 7 mg daily  - Glipizide 5 mg, 1 tablet daily  - Continue  Metformin 1000 mg one tablet day  - Continue  Farxiga 10  mg, 1 tablet daliy    I would recommend switching Rybelsus to [facility] once weekly injections.  Let me know when you are about to finish the Rybelsus so we can write a prescription for Mounjaro   - HOW TO TREAT LOW BLOOD SUGARS (Blood sugar LESS THAN 70 MG/DL) Please follow the RULE OF 15 for the treatment of hypoglycemia treatment (when your (blood sugars are less than 70 mg/dL)   STEP 1: Take 15 grams of carbohydrates when your blood sugar is low, which includes:  3-4 GLUCOSE TABS  OR 3-4 OZ OF JUICE OR REGULAR SODA OR ONE TUBE OF GLUCOSE GEL    STEP 2: RECHECK blood sugar in 15 MINUTES STEP 3: If your blood sugar is still low at the 15 minute recheck --> then, go back to STEP 1 and treat AGAIN with another 15 grams of carbohydrates.

## 2022-08-13 NOTE — Progress Notes (Signed)
Name: Nancy [name]  Age/ Sex: 82 y.o., female   MRN/ DOB: [id number], 05-Aug-1940     PCP: Hillery Aldo, NP   Reason for Endocrinology Evaluation: Type 2 Diabetes Mellitus  Initial Endocrine Consultative Visit: 10/31/2017    PATIENT IDENTIFIER: Nancy [name] is a 82 y.o. female with a past medical history of HTN, Gout, Hyperlipidemia, and T2 DM . The patient has followed with Endocrinology clinic since 10/31/17 for consultative assistance with management of her diabetes.  DIABETIC HISTORY:  Nancy [name] was diagnosed with T2DM ~ in 2009, she has been on oral glycemic agents since her diagnosis. Has not been on insulin prior to her presentation here. Her hemoglobin A1c has ranged from 7.3% in 09/2015, peaking at 8.6% in 08/2015.  Glipizide stopped 06/2019 with an A1c of 6.0 %, but had to be started 07/2022 due to hypoglycemia  Nancy [name] 09/24/2020 Started Rybelsus May 2023 SUBJECTIVE:   During the last visit (02/08/2022): A1c 6.3%     Today (08/13/2022): Nancy [name] is here for a  follow up on diabetes management. She has been checking glucose occasionally.    She continues to follow up with the pain clinic , and receives intra-articular glucocorticoid injections She follows with  cardiology  for nonobstructive CAD Had a follow-up with podiatry 05/18/2022 She is on Losartan through nephrology   She was hospitalized for sepsis due to UTI 07/2022 She also had a syncopal episode 07/2022, due to sinus bradycardia, BG at the time 255 mg/DL   She noted "feeling bad" after eating while on Rybelsus and has not ben taking it on daily basis  Has occasional constipation  She has not been taking Glipizide since hospitalization   HOME DIABETES REGIMEN:  Metfomrin 1000 mg daily Glipizide 5 mg daily Rybelsus 7 mg daily Farxiga 10 mg daily    METER DOWNLOAD SUMMARY: 144-327 mg/dL    DIABETIC COMPLICATIONS: Microvascular complications:  Neuropathy Denies:  retinopathy, nephropathy Last eye exam: Completed 08/2020 scheduled 02/2022     Macrovascular complications:   CAD(nonobstructive CAD) Denies: PVD, CVA    HISTORY:  Past Medical History:  Past Medical History:  Diagnosis Date   Allergy    Anemia    Aortic atherosclerosis (HCC)    Atrial tachycardia    Breast cyst    left breast   CAD (coronary artery disease), native coronary artery    minimal ASCAD to the distal LM and mid LAD with 0-24% stenosis and Calcium score of 26.   Chest pain    CKD (chronic kidney disease), stage III (HCC)    Complication of anesthesia 1986   slow to awaken after cholecystectomy   Diastolic dysfunction    with mild pulmonary HTN by echo 07/2011   Gastric ulcer    GERD (gastroesophageal reflux disease)    Gout    Hyperlipidemia    Hypertension    Hypertensive heart disease    Neuropathy    from diabetes per patient   Osteoarthritis    Osteopenia    Shortness of breath dyspnea    with activity   Sinus pause    Spine deformity    lower spine   Type II diabetes mellitus (HCC)    Past Surgical History:  Past Surgical History:  Procedure Laterality Date   ABDOMINAL ADHESION SURGERY     ABDOMINAL HYSTERECTOMY     ANUS SURGERY     for torn tissue   APPENDECTOMY     BREAST BIOPSY  BREAST LUMPECTOMY     left   CHOLECYSTECTOMY  1986   COLONOSCOPY     ESOPHAGOGASTRODUODENOSCOPY (EGD) WITH PROPOFOL N/A 07/18/2013   Procedure: ESOPHAGOGASTRODUODENOSCOPY (EGD) WITH PROPOFOL;  Surgeon: Willis Modena, MD;  Location: [facility];  Service: Endoscopy;  Laterality: N/A;   EUS N/A 07/18/2013   Procedure: ESOPHAGEAL ENDOSCOPIC ULTRASOUND (EUS) RADIAL;  Surgeon: Willis Modena, MD;  Location: [facility];  Service: Endoscopy;  Laterality: N/A;   IR GENERIC HISTORICAL  10/30/2015   IR US GUIDE VASC ACCESS RIGHT 10/30/2015 Barnetta Chapel, PA-C [facility]   IR GENERIC HISTORICAL  10/30/2015   IR FLUORO GUIDE CV LINE RIGHT 10/30/2015 Barnetta Chapel,  PA-C [facility]   LUMBAR WOUND DEBRIDEMENT N/A 10/01/2015   Procedure: LUMBAR WOUND DEBRIDEMENT;  Surgeon: Venita Lick, MD;  Location: [facility];  Service: Orthopedics;  Laterality: N/A;   LUMBAR WOUND DEBRIDEMENT N/A 10/15/2015   Procedure: [facility] OUT AND CLOSURE OF BACK WOUND;  Surgeon: Venita Lick, MD;  Location: [facility];  Service: Orthopedics;  Laterality: N/A;   MASS EXCISION Left 07/24/2015   Procedure: EXCISION MASS LEFT LONG FINGER;  Surgeon: Betha Loa, MD;  Location: [facility];  Service: Orthopedics;  Laterality: Left;   SPINAL FUSION N/A 09/17/2015   Procedure: FUSION POSTERIOR SPINAL MULTILEVEL L4- S1;  Surgeon: Venita Lick, MD;  Location: [facility];  Service: Orthopedics;  Laterality: N/A;   TRANSFORAMINAL LUMBAR INTERBODY FUSION (TLIF) WITH PEDICLE SCREW FIXATION 1 LEVEL N/A 09/17/2015   Procedure: TRANSFORAMINAL LUMBAR INTERBODY FUSION (TLIF) WITH PEDICLE SCREW FIXATION 1 LEVEL , Lumbar 4-5;  Surgeon: Venita Lick, MD;  Location: [facility];  Service: Orthopedics;  Laterality: N/A;   Social History:  reports that she has never smoked. She has never used smokeless tobacco. She reports that she does not drink alcohol and does not use drugs. Family History:  Family History  Problem Relation Age of Onset   Heart disease Mother    Cancer Sister        ovarian     HOME MEDICATIONS: Allergies as of 08/13/2022       Reactions   Ketorolac Tromethamine Swelling   10/02/15 - given Toradol + morphine and RN noted tongue swelling - given Epi + Benadryl Has taken ibuprofen in past w/o problem Other reaction(s): Unknown   Morphine Swelling   Face and tongue swelling   Morphine Sulfate    Other reaction(s): Unknown   Codeine Other (See Comments)   headache Other reaction(s): Unknown        Medication List        Accurate as of August 13, 2022  1:09 PM. If you have any questions, ask your nurse or doctor.          STOP taking these medications    furosemide 20 MG  tablet Commonly known as: LASIX Stopped by: Johnney Ou    traZODone 50 MG tablet Commonly known as: DESYREL Stopped by: Johnney Ou        TAKE these medications    allopurinol 300 MG tablet Commonly known as: ZYLOPRIM Take 300 mg by mouth daily.   amLODipine 5 MG tablet Commonly known as: NORVASC Take 5 mg by mouth daily.   aspirin EC 81 MG tablet Take 81 mg by mouth daily.   atorvastatin 10 MG tablet Commonly known as: LIPITOR Take 1 tablet by mouth once daily   CareSens N Glucose Test test strip Generic drug: glucose blood 1 each by Other route daily in the afternoon.  Use as instructed   ciprofloxacin 500 MG tablet Commonly known as: CIPRO Take 1 tablet (500 mg total) by mouth 2 (two) times daily for 10 days.   colchicine 0.6 MG tablet Take 0.6 mg by mouth daily.   dapagliflozin propanediol 10 MG Tabs tablet Commonly known as: Farxiga Take 1 tablet (10 mg total) by mouth daily before breakfast.   gabapentin 300 MG capsule Commonly known as: NEURONTIN Take 600 mg by mouth 3 (three) times daily.   glipiZIDE 5 MG tablet Commonly known as: GLUCOTROL Take 1 tablet (5 mg total) by mouth 2 (two) times daily before a meal.   losartan 25 MG tablet Commonly known as: COZAAR Take 25 mg by mouth at bedtime.   metFORMIN 1000 MG tablet Commonly known as: GLUCOPHAGE Take 1 tablet (1,000 mg total) by mouth daily with breakfast.   metoprolol tartrate 50 MG tablet Commonly known as: LOPRESSOR Take 0.5 tablets (25 mg total) by mouth 2 (two) times daily.   Multiple Vitamins tablet Take 1 tablet by mouth daily.   omeprazole 20 MG capsule Commonly known as: PRILOSEC Take 20 mg by mouth daily as needed (acid reflux).   OneTouch Verio IQ System w/Device Kit 1 kit by Does not apply route 2 (two) times daily. E11.65   polyethylene glycol 17 g packet Commonly known as: MIRALAX / GLYCOLAX Take 17 g by mouth 2 (two) times daily.   Rybelsus 7 MG  Tabs Generic drug: Semaglutide Take 1 tablet (7 mg total) by mouth daily.   senna-docusate 8.6-50 MG tablet Commonly known as: Senokot-S Take 1 tablet by mouth 2 (two) times daily.         OBJECTIVE:   Vital Signs: BP 128/74 (BP Location: Left Arm, Patient Position: Sitting, Cuff Size: Large)   Pulse 79   Ht 5\' 2"  (1.575 m)   Wt 176 lb (79.8 kg)   SpO2 98%   BMI 32.19 kg/m   Wt Readings from Last 3 Encounters:  08/13/22 176 lb (79.8 kg)  08/05/22 178 lb 9.2 oz (81 kg)  07/22/22 177 lb (80.3 kg)     Exam: General: Pt appears well and is in NAD  Lungs: Clear with good BS bilat with no rales, rhonchi, or wheezes  Heart: RRR   Extremities: No  pretibial edema.   Neuro: MS is good with appropriate affect, pt is alert and Ox3    DM foot exam: per podiatry 05/18/2022  The skin of the feet is intact without sores or ulcerations. The pedal pulses are 2+ on right and 2+ on left. The sensation is decreased  to a screening 5.07, 10 gram monofilament at the left great toe         DATA REVIEWED:  Labs done at PCP office, these are not available at this time   ASSESSMENT / PLAN / RECOMMENDATIONS:   1) Type 2 Diabetes Mellitus, poorly controlled, With Neuropathic and macrovascular complications - Most recent A1c of 8.0  %. Goal A1c < 7.0 %.    -Her A1c has trended up, she recently had her A1c at PCPs office at 8.0%, records not available at this time  - We had reduced her metformin by 50% in the past due to GFR less than 45 -Poorly controlled diabetes due to medication nonadherence, she has been using Rybelsus on every day basis, she has noted feeling different after eating while on Rybelsus, she also forgot to take her glipizide after her hospital discharge -I did recommend switching Rybelsus to  Ozempic or Mounjaro, but she has plenty of vials at home and would like to finish this first -I have encouraged the patient to restart glipizide again   MEDICATIONS: -Continue  Rybelsus  7 mg daily  -Continue metformin 1000 mg ONCE daily  -Continue  Farxiga 10 mg daily -Restart glipizide 5 mg daily   EDUCATION / INSTRUCTIONS: BG monitoring instructions: Patient is instructed to check her blood sugars 1 times a day, fasting  Call [facility] if: BG persistently < 70  I reviewed the Rule of 15 for the treatment of hypoglycemia in detail with the patient. Literature supplied.   2) Diabetic complications:  Eye: Does not have known diabetic retinopathy.  Neuro/ Feet: Does  have known diabetic peripheral neuropathy. Renal: Patient does  have known baseline CKD. She is on an ACEI/ARB at present. Normal Ma/Cr ratio       F/U in 6 months   Signed electronically by: Lyndle Herrlich, MD  [facility]  [facility] [address] [place] [place], [place] [address] Phone: [phone] FAX: [phone]   CC:  Hillery Aldo, NP [address] [place] [address] Phone: [phone]  Fax: [phone]  Return to Endocrinology clinic as below: Future Appointments  Date Time Provider Department Center  08/13/2022  1:20 PM , Konrad Dolores, MD [facility] None  08/19/2022  8:25 AM Sharlene Dory, PA-C [facility]  08/19/2022  1:15 PM Ardelle Anton Lesia Sago, DPM [facility]

## 2022-08-16 ENCOUNTER — Other Ambulatory Visit (HOSPITAL_COMMUNITY): Payer: Self-pay

## 2022-08-18 NOTE — Progress Notes (Deleted)
  Cardiology Office Note:  .   Date:  08/18/2022  ID:  Nancy [name], DOB 1940-06-27, MRN [id number] PCP: Hillery Aldo, NP  [facility] Providers Cardiologist:  Armanda Magic, MD {  History of Present Illness: [name]   Nancy [name] is a 82 y.o. female with a past medical history of HTN, OSA on CPAP and diastolic dysfunction here for a follow-up appointment.  Also has a history of mild pulmonary hypertension by 2D echocardiogram in July 2013.  On review, history includes Lexiscan Myoview for chest pain 11/09/2016 which showed no inducible ischemia.  2D echocardiogram 2019 showed normal LVEF 66 5% with moderate LVH and elevated filling pressures.  Has a history of atypical chest pain and DOE so coronary CTA was done which showed minimal CAD of the left main, mid and distal LAD and her CP was felt to be noncardiac in origin.  2D echo was normal with severe ASSH, no PHTN and cardiac MRI was done showing normal LVEF of 73%, moderate PSH, normal RV and no evidence of cardiac amyloid or HOCM and finding consistent with hypertensive heart disease.  She was seen by Dr. Mayford Knife 06/17/2022 and she was doing well.  Denied chest pressure/pain, SOB, DOE (except for extreme exertion which was unchanged), PND, orthopnea, palpitations or syncope.  Lower extremity edema was off and on.  Compliant with medicines.  Had been using her CPAP off-and-on because she does not sleep well and mainly sleeps in her recliner.  Tolerates the mask and feels pressure is adequate.  Today, she is***  ROS: ***  Studies Reviewed: .        *** Risk Assessment/Calculations:   {Does this patient have ATRIAL FIBRILLATION?[phone]} No BP recorded.  {Refresh Note OR Click here to enter BP  :1}***       Physical Exam:   VS:  There were no vitals taken for this visit.   Wt Readings from Last 3 Encounters:  08/13/22 176 lb (79.8 kg)  08/05/22 178 lb 9.2 oz (81 kg)  07/22/22 177 lb (80.3 kg)    GEN: Well  nourished, well developed in no acute distress NECK: No JVD; No carotid bruits CARDIAC: ***RRR, no murmurs, rubs, gallops RESPIRATORY:  Clear to auscultation without rales, wheezing or rhonchi  ABDOMEN: Soft, non-tender, non-distended EXTREMITIES:  No edema; No deformity   ASSESSMENT AND PLAN: .   1.  Hypertension 2.  CAD 3.  Pulmonary hypertension -This was seen on an echocardiogram in 2013, echo in 2018 without pulmonary HTN -PASP 28 mmHg and no PHTN on echo 11/2019 -2D echo 03/31/2022 showed LVEF 65%, grade 1 DD, normal PA pressure  4.  OSA 5.  PVCs 6.  HLD    {Are you ordering a CV Procedure (e.g. stress test, cath, DCCV, TEE, etc)?   Press F2        :[id number]}  Dispo: ***  Signed, Sharlene Dory, PA-C

## 2022-08-19 ENCOUNTER — Ambulatory Visit: Payer: 59 | Admitting: Podiatry

## 2022-08-19 ENCOUNTER — Ambulatory Visit: Payer: 59 | Admitting: Physician Assistant

## 2022-08-19 DIAGNOSIS — E785 Hyperlipidemia, unspecified: Secondary | ICD-10-CM

## 2022-08-19 DIAGNOSIS — G4733 Obstructive sleep apnea (adult) (pediatric): Secondary | ICD-10-CM

## 2022-08-19 DIAGNOSIS — Z79899 Other long term (current) drug therapy: Secondary | ICD-10-CM

## 2022-08-19 DIAGNOSIS — I2510 Atherosclerotic heart disease of native coronary artery without angina pectoris: Secondary | ICD-10-CM

## 2022-08-19 DIAGNOSIS — I493 Ventricular premature depolarization: Secondary | ICD-10-CM

## 2022-08-19 DIAGNOSIS — I10 Essential (primary) hypertension: Secondary | ICD-10-CM

## 2022-08-20 ENCOUNTER — Telehealth: Payer: Self-pay

## 2022-08-20 NOTE — Telephone Encounter (Signed)
-----   Message from Nurse Mearl Latin sent at 08/18/2022 10:43 AM EDT -----  ----- Message ----- From: Quintella Reichert, MD Sent: 08/17/2022   8:59 PM EDT To: Hillery Aldo, NP; [facility] Triage  ALT mildly elevated but likely related to fatty liver.  Needs to follow mediterranean diet and work on exercise

## 2022-08-20 NOTE — Telephone Encounter (Signed)
Results reviewed with patient, who verbalizes understanding of Dr. Norris Cross recommendation to eat a mediterranean diet and increase exercise.

## 2022-09-02 ENCOUNTER — Ambulatory Visit (INDEPENDENT_AMBULATORY_CARE_PROVIDER_SITE_OTHER): Payer: 59 | Admitting: Podiatry

## 2022-09-02 DIAGNOSIS — E1149 Type 2 diabetes mellitus with other diabetic neurological complication: Secondary | ICD-10-CM | POA: Diagnosis not present

## 2022-09-02 DIAGNOSIS — M79675 Pain in left toe(s): Secondary | ICD-10-CM | POA: Diagnosis not present

## 2022-09-02 DIAGNOSIS — M79674 Pain in right toe(s): Secondary | ICD-10-CM

## 2022-09-02 DIAGNOSIS — B351 Tinea unguium: Secondary | ICD-10-CM | POA: Diagnosis not present

## 2022-09-06 NOTE — Progress Notes (Signed)
Subjective: Chief Complaint  Patient presents with   Nail Problem    Diabetic Foot Care-nail trim    Foot Swelling    Bilateral foot swelling, right is worst than left. She has not been using her compression socks.      82 year old female presents with above concerns.  She states her nails are getting longer causing discomfort.  No swelling or redness or any drainage to the toenail sites.  She recently has noticed some swelling but she has not been wearing the compression socks on a regular basis.  No open lesions.  Objective: AAO x3, NAD DP/PT pulses palpable bilaterally, CRT less than 3 seconds Sensation decreased with Semmes Weinstein monofilament Chronic appearing edema present bilaterally. Nails are hypertrophic, dystrophic, brittle, discolored, elongated 10. No surrounding redness or drainage. Tenderness nails 1-5 bilaterally. No open lesions or pre-ulcerative lesions are identified today. No significant pain otherwise today. No pain with calf compression, swelling, warmth, erythema  Assessment: 82 year old female with symptomatic onychomycosis, neuropathy, Plantar fasciitis  Plan: -All treatment options discussed with the patient including all alternatives, risks, complications.  -Continue topical compound cream through [facility] for neuropathy. -Encouraged compliance with compression.  Encouraged elevation.  Monitor any skin breakdown -Sharply debrided the nails x 10 without any complications or bleeding -Patient encouraged to call the office with any questions, concerns, change in symptoms.   Vivi Barrack DPM

## 2022-10-21 ENCOUNTER — Ambulatory Visit: Payer: 59 | Admitting: Nurse Practitioner

## 2022-10-26 NOTE — Progress Notes (Deleted)
Cardiology Office Note    Patient Name: Nancy [name] Date of Encounter: 10/26/2022  Primary Care Provider:  Hillery Aldo, NP Primary Cardiologist:  Armanda Magic, MD Primary Electrophysiologist: None   Past Medical History    Past Medical History:  Diagnosis Date   Allergy    Anemia    Aortic atherosclerosis (HCC)    Atrial tachycardia    Breast cyst    left breast   CAD (coronary artery disease), native coronary artery    minimal ASCAD to the distal LM and mid LAD with 0-24% stenosis and Calcium score of 26.   Chest pain    CKD (chronic kidney disease), stage III (HCC)    Complication of anesthesia 1986   slow to awaken after cholecystectomy   Diastolic dysfunction    with mild pulmonary HTN by echo 07/2011   Gastric ulcer    GERD (gastroesophageal reflux disease)    Gout    Hyperlipidemia    Hypertension    Hypertensive heart disease    Neuropathy    from diabetes per patient   Osteoarthritis    Osteopenia    Shortness of breath dyspnea    with activity   Sinus pause    Spine deformity    lower spine   Type II diabetes mellitus (HCC)     History of Present Illness  Nancy [name] is a 82 y.o. female with a PMH of HFpEF, OSA (on CPAP), CKD stage IIIa, DM type II, HTN, HLD, PVCs/atrial tach, GERD, mild pulmonary HTN, who presents today for hospital follow-up of syncope.  Nancy [name] has been followed by Nancy [name] since 2015 management of HTN and diastolic dysfunction.  She underwent a 2D echo in 2013 that showed mild pulmonary HTN in 2016.  She reported episodes of chest pain with DOE in 2018 and underwent Lexiscan Myoview that showed no evidence of ischemia and was low risk.  She continued to experience chest discomfort and underwent a coronary CTA in 2019 revealing low calcium score and nonobstructive CAD with small hiatal hernia.  He had repeat coronary CT completed in 2021 that showed minimal atherosclerosis with calcium score of 26.  She also had an  event monitor ordered that showed sinus bradycardia with sinus tach with 2 episodes of sinus pauses up to 3.5 seconds with 1 occurring while sleeping of 30 bpm.  She was seen in follow-up on 11/2019 and continued to experience palpitations 1 episode of dizziness.  She had metoprolol decreased at that time was advised to continue to check her blood pressures.  She underwent a cardiac MRI to rule out amyloidosis that was normal but did show LVH due to HTN.  She was last seen in follow-up on 04/2021 and during visit patient was doing well but did report a pins and needle sensation in her chest with some pressure that radiates into her back.  She reported the sensation being stronger and more frequent than normal.  She underwent a coronary CTA to evaluate that showed calcium score of 48 with mild coronary disease and no obstructive lesions noted.  She was seen in the ED on 04/2021 with syncope while on the toilet and was felt to be vasovagal in etiology TTE was completed showing EF 60-65%, no regional wall motion abnormalities, moderate asymmetric LVH, with no LVOT obstruction, normal RV systolic function. She was also found to have a UTI and AKI that resolved with hydration and antibiotics.  She was last seen by Nancy [name] on  06/2022 and was doing well with controlled BP and no complaints of chest pain.  She presented to the ED on 07/22/2022 with syncope and was evaluated and reported undergoing steroid shots to both knees and experienced elevated blood sugars in the 600s.  She had ACS workup that was negative with normal troponins and no arrhythmia noted on telemetry or EKG.  It was suspected that syncope was related to possible dehydration from polyuria due to hyperglycemia.  Recommendation was made for outpatient monitoring and patient was directed to hold Lasix.  She was later discharged and readmitted on 7/23 with sepsis secondary to a UTI.  She was admitted and treated with antibiotics for 10 days.  During  today's visit the patient reports*** .  Patient denies chest pain, palpitations, dyspnea, PND, orthopnea, nausea, vomiting, dizziness, syncope, edema, weight gain, or early satiety.  ***Notes: -Last ischemic evaluation: -Last echo: -Interim ED visits: Review of Systems  Please see the history of present illness.    All other systems reviewed and are otherwise negative except as noted above.  Physical Exam    Wt Readings from Last 3 Encounters:  08/13/22 176 lb (79.8 kg)  08/05/22 178 lb 9.2 oz (81 kg)  07/22/22 177 lb (80.3 kg)   WU:[id number] were no vitals filed for this visit.,There is no height or weight on file to calculate BMI. GEN: Well nourished, well developed in no acute distress Neck: No JVD; No carotid bruits Pulmonary: Clear to auscultation without rales, wheezing or rhonchi  Cardiovascular: Normal rate. Regular rhythm. Normal S1. Normal S2.   Murmurs: There is no murmur.  ABDOMEN: Soft, non-tender, non-distended EXTREMITIES:  No edema; No deformity   EKG/LABS/ Recent Cardiac Studies   ECG personally reviewed by me today - ***  Risk Assessment/Calculations:   {Does this patient have ATRIAL FIBRILLATION?[phone]}      Lab Results  Component Value Date   WBC 6.0 08/06/2022   HGB 9.9 (L) 08/06/2022   HCT 30.7 (L) 08/06/2022   MCV 88.5 08/06/2022   PLT 128 (L) 08/06/2022   Lab Results  Component Value Date   CREATININE 0.91 08/06/2022   BUN 14 08/06/2022   NA 136 08/06/2022   K 3.4 (L) 08/06/2022   CL 101 08/06/2022   CO2 25 08/06/2022   Lab Results  Component Value Date   CHOL 101 06/17/2022   HDL 34 (L) 06/17/2022   LDLCALC 46 06/17/2022   TRIG 111 06/17/2022   CHOLHDL 3.0 06/17/2022    Lab Results  Component Value Date   HGBA1C 6.3 (A) 02/08/2022   Assessment & Plan    1.  Syncope and collapse: -Patient recently experienced syncope and collapse which was found to be secondary to possible dehydration due to polyuria from elevated CBGs. -Today  patient reports***  2.  Essential hypertension: -Patient's blood pressure today was***  3.  Nonobstructive CAD: -Most recent coronary CTA showed calcium score of 48 with nonobstructive CAD present  4.  History of OSA: -Patient currently compliant with CPAP  5.  History of PVCs: -Today patient reports*** -Continue***      Disposition: Follow-up with Armanda Magic, MD or APP in *** months {Are you ordering a CV Procedure (e.g. stress test, cath, DCCV, TEE, etc)?   Press F2        :[id number]}   Signed, Napoleon Form, Leodis Rains, NP 10/26/2022, 12:34 PM [facility]

## 2022-10-29 ENCOUNTER — Ambulatory Visit: Payer: 59 | Admitting: Nurse Practitioner

## 2022-10-29 DIAGNOSIS — G4733 Obstructive sleep apnea (adult) (pediatric): Secondary | ICD-10-CM

## 2022-10-29 DIAGNOSIS — I119 Hypertensive heart disease without heart failure: Secondary | ICD-10-CM

## 2022-10-29 DIAGNOSIS — I2510 Atherosclerotic heart disease of native coronary artery without angina pectoris: Secondary | ICD-10-CM

## 2022-10-29 DIAGNOSIS — I493 Ventricular premature depolarization: Secondary | ICD-10-CM

## 2022-10-29 DIAGNOSIS — R55 Syncope and collapse: Secondary | ICD-10-CM

## 2022-10-30 ENCOUNTER — Encounter (HOSPITAL_BASED_OUTPATIENT_CLINIC_OR_DEPARTMENT_OTHER): Payer: Self-pay

## 2022-10-30 ENCOUNTER — Emergency Department (HOSPITAL_BASED_OUTPATIENT_CLINIC_OR_DEPARTMENT_OTHER): Payer: 59

## 2022-10-30 ENCOUNTER — Emergency Department (HOSPITAL_BASED_OUTPATIENT_CLINIC_OR_DEPARTMENT_OTHER)
Admission: EM | Admit: 2022-10-30 | Discharge: 2022-10-30 | Disposition: A | Discharge status: Home / Self Care | Payer: 59 | Attending: Emergency Medicine | Admitting: Emergency Medicine

## 2022-10-30 DIAGNOSIS — Z7984 Long term (current) use of oral hypoglycemic drugs: Secondary | ICD-10-CM | POA: Insufficient documentation

## 2022-10-30 DIAGNOSIS — N183 Chronic kidney disease, stage 3 unspecified: Secondary | ICD-10-CM | POA: Insufficient documentation

## 2022-10-30 DIAGNOSIS — Z7982 Long term (current) use of aspirin: Secondary | ICD-10-CM | POA: Insufficient documentation

## 2022-10-30 DIAGNOSIS — I129 Hypertensive chronic kidney disease with stage 1 through stage 4 chronic kidney disease, or unspecified chronic kidney disease: Secondary | ICD-10-CM | POA: Insufficient documentation

## 2022-10-30 DIAGNOSIS — I2510 Atherosclerotic heart disease of native coronary artery without angina pectoris: Secondary | ICD-10-CM | POA: Insufficient documentation

## 2022-10-30 DIAGNOSIS — E1140 Type 2 diabetes mellitus with diabetic neuropathy, unspecified: Secondary | ICD-10-CM | POA: Diagnosis not present

## 2022-10-30 DIAGNOSIS — R109 Unspecified abdominal pain: Secondary | ICD-10-CM

## 2022-10-30 DIAGNOSIS — N3090 Cystitis, unspecified without hematuria: Secondary | ICD-10-CM | POA: Diagnosis not present

## 2022-10-30 DIAGNOSIS — Z79899 Other long term (current) drug therapy: Secondary | ICD-10-CM | POA: Diagnosis not present

## 2022-10-30 LAB — URINALYSIS, ROUTINE W REFLEX MICROSCOPIC
Bilirubin Urine: NEGATIVE
Glucose, UA: 250 mg/dL — AB
Hgb urine dipstick: NEGATIVE
Ketones, ur: NEGATIVE mg/dL
Nitrite: NEGATIVE
Protein, ur: 30 mg/dL — AB
Specific Gravity, Urine: 1.015 (ref 1.005–1.030)
pH: 6 (ref 5.0–8.0)

## 2022-10-30 LAB — COMPREHENSIVE METABOLIC PANEL
ALT: 35 U/L (ref 0–44)
AST: 41 U/L (ref 15–41)
Albumin: 4.1 g/dL (ref 3.5–5.0)
Alkaline Phosphatase: 98 U/L (ref 38–126)
Anion gap: 9 (ref 5–15)
BUN: 13 mg/dL (ref 8–23)
CO2: 28 mmol/L (ref 22–32)
Calcium: 9.9 mg/dL (ref 8.9–10.3)
Chloride: 98 mmol/L (ref 98–111)
Creatinine, Ser: 0.92 mg/dL (ref 0.44–1.00)
GFR, Estimated: >60 mL/min (ref >60)
Glucose, Bld: 123 mg/dL — ABNORMAL HIGH (ref 70–99)
Potassium: 3.1 mmol/L — ABNORMAL LOW (ref 3.5–5.1)
Sodium: 135 mmol/L (ref 135–145)
Total Bilirubin: 1.7 mg/dL — ABNORMAL HIGH (ref 0.3–1.2)
Total Protein: 7.4 g/dL (ref 6.5–8.1)

## 2022-10-30 LAB — CBC
HCT: 35.2 % — ABNORMAL LOW (ref 36.0–46.0)
Hemoglobin: 11.8 g/dL — ABNORMAL LOW (ref 12.0–15.0)
MCH: 28.4 pg (ref 26.0–34.0)
MCHC: 33.5 g/dL (ref 30.0–36.0)
MCV: 84.8 fL (ref 80.0–100.0)
Platelets: 181 10*3/uL (ref 150–400)
RBC: 4.15 MIL/uL (ref 3.87–5.11)
RDW: 14.2 % (ref 11.5–15.5)
WBC: 7 10*3/uL (ref 4.0–10.5)
nRBC: 0 % (ref 0.0–0.2)

## 2022-10-30 LAB — LIPASE, BLOOD: Lipase: 24 U/L (ref 11–51)

## 2022-10-30 MED ORDER — IOHEXOL 300 MG/ML  SOLN
100.0000 mL | Freq: Once | INTRAMUSCULAR | Status: AC | PRN
  Administered 2022-10-30: 75 mL via INTRAVENOUS

## 2022-10-30 MED ORDER — CEFPODOXIME PROXETIL 200 MG PO TABS: 200.0000 mg | ORAL_TABLET | Freq: Two times a day (BID) | ORAL | 0 refills | Status: AC

## 2022-10-30 MED ORDER — OXYCODONE HCL 5 MG PO TABS
5.0000 mg | ORAL_TABLET | Freq: Once | ORAL | Status: AC
  Administered 2022-10-30: 5 mg via ORAL
  Filled 2022-10-30: qty 1

## 2022-10-30 MED ORDER — FENTANYL CITRATE PF 50 MCG/ML IJ SOSY
25.0000 ug | PREFILLED_SYRINGE | Freq: Once | INTRAMUSCULAR | Status: AC
  Administered 2022-10-30: 25 ug via INTRAVENOUS
  Filled 2022-10-30: qty 1

## 2022-10-30 MED ORDER — ONDANSETRON HCL 4 MG/2ML IJ SOLN
4.0000 mg | Freq: Once | INTRAMUSCULAR | Status: AC
  Administered 2022-10-30: 4 mg via INTRAVENOUS
  Filled 2022-10-30: qty 2

## 2022-10-30 MED ORDER — ONDANSETRON 4 MG PO TBDP: 4.0000 mg | ORAL_TABLET | Freq: Three times a day (TID) | ORAL | 0 refills | Status: AC | PRN

## 2022-10-30 MED ORDER — PHENAZOPYRIDINE HCL 200 MG PO TABS: 200.0000 mg | ORAL_TABLET | Freq: Three times a day (TID) | ORAL | 0 refills | Status: AC

## 2022-10-30 MED ORDER — SODIUM CHLORIDE 0.9 % IV SOLN
1.0000 g | Freq: Once | INTRAVENOUS | Status: AC
  Administered 2022-10-30: 1 g via INTRAVENOUS
  Filled 2022-10-30: qty 10

## 2022-10-30 NOTE — ED Notes (Signed)
Pt ambulated to the restroom with nurse with even steady slow gait, no apparent distress.

## 2022-10-30 NOTE — ED Provider Notes (Signed)
[place] [facility] AT [facility] Provider Note   CSN: [id number] Arrival date & time: 10/30/22  1110     History {Add pertinent medical, surgical, social history, OB history to HPI:1} Chief Complaint  Patient presents with   Abdominal Pain    Nancy [name] is a 82 y.o. female.  HPI     Pain since Monday.  Home Medications Prior to Admission medications   Medication Sig Start Date End Date Taking? Authorizing Provider  allopurinol (ZYLOPRIM) 300 MG tablet Take 300 mg by mouth daily.  10/07/18   [provider]  amLODipine (NORVASC) 5 MG tablet Take 5 mg by mouth daily. 10/29/19   [provider]  aspirin EC 81 MG tablet Take 81 mg by mouth daily.    [provider]  atorvastatin (LIPITOR) 10 MG tablet Take 1 tablet by mouth once daily 07/05/22   Turner, Cornelious Bryant, MD  Blood Glucose Monitoring Suppl (ONETOUCH VERIO IQ SYSTEM) w/Device KIT 1 kit by Does not apply route 2 (two) times daily. E11.65 08/24/18   Shamleffer, Konrad Dolores, MD  colchicine 0.6 MG tablet Take 0.6 mg by mouth daily.    [provider]  dapagliflozin propanediol (FARXIGA) 10 MG TABS tablet Take 1 tablet (10 mg total) by mouth daily before breakfast. 08/13/22   Shamleffer, Konrad Dolores, MD  gabapentin (NEURONTIN) 300 MG capsule Take 600 mg by mouth 3 (three) times daily. 05/05/16   [provider]  glipiZIDE (GLUCOTROL) 5 MG tablet Take 1 tablet (5 mg total) by mouth daily before breakfast. 08/13/22   Shamleffer, Konrad Dolores, MD  glucose blood (CARESENS N GLUCOSE TEST) test strip 1 each by Other route daily in the afternoon. Use as instructed 08/13/22   Shamleffer, Konrad Dolores, MD  losartan (COZAAR) 25 MG tablet Take 25 mg by mouth at bedtime. 09/15/21   [provider]  metFORMIN (GLUCOPHAGE) 1000 MG tablet Take 1 tablet (1,000 mg total) by mouth daily with breakfast. 08/13/22   Shamleffer, Konrad Dolores, MD  metoprolol tartrate  (LOPRESSOR) 50 MG tablet Take 0.5 tablets (25 mg total) by mouth 2 (two) times daily. 11/13/19 08/03/22  Laurann Montana, PA-C  Multiple Vitamins tablet Take 1 tablet by mouth daily. 07/13/19   [provider]  omeprazole (PRILOSEC) 20 MG capsule Take 20 mg by mouth daily as needed (acid reflux).    [provider]  polyethylene glycol (MIRALAX / GLYCOLAX) 17 g packet Take 17 g by mouth 2 (two) times daily. 08/07/22   Regalado, Belkys A, MD  Semaglutide (RYBELSUS) 7 MG TABS Take 1 tablet (7 mg total) by mouth daily. 02/08/22   Shamleffer, Konrad Dolores, MD  senna-docusate (SENOKOT-S) 8.6-50 MG tablet Take 1 tablet by mouth 2 (two) times daily. 08/07/22   Regalado, Jon Billings A, MD      Allergies    Ketorolac tromethamine, Morphine, Morphine sulfate, and Codeine    Review of Systems   Review of Systems  Physical Exam Updated Vital Signs BP 139/67 (BP Location: Right Arm)   Pulse 79   Temp 98.1 F (36.7 C) (Oral)   Resp 16   SpO2 98%  Physical Exam  ED Results / Procedures / Treatments   Labs (all labs ordered are listed, but only abnormal results are displayed) Labs Reviewed  COMPREHENSIVE METABOLIC PANEL - Abnormal; Notable for the following components:      Result Value   Potassium 3.1 (*)    Glucose, Bld 123 (*)    Total  Bilirubin 1.7 (*)    All other components within normal limits  CBC - Abnormal; Notable for the following components:   Hemoglobin 11.8 (*)    HCT 35.2 (*)    All other components within normal limits  URINALYSIS, ROUTINE W REFLEX MICROSCOPIC - Abnormal; Notable for the following components:   APPearance HAZY (*)    Glucose, UA 250 (*)    Protein, ur 30 (*)    Leukocytes,Ua SMALL (*)    Bacteria, UA MANY (*)    All other components within normal limits  URINE CULTURE  LIPASE, BLOOD    EKG None  Radiology CT ABDOMEN PELVIS W CONTRAST  Result Date: 10/30/2022 CLINICAL DATA:  Left lower quadrant abdominal pain. EXAM: CT ABDOMEN AND PELVIS  WITH CONTRAST TECHNIQUE: Multidetector CT imaging of the abdomen and pelvis was performed using the standard protocol following bolus administration of intravenous contrast. RADIATION DOSE REDUCTION: This exam was performed according to the departmental dose-optimization program which includes automated exposure control, adjustment of the mA and/or kV according to patient size and/or use of iterative reconstruction technique. CONTRAST:  75mL OMNIPAQUE IOHEXOL 300 MG/ML  SOLN COMPARISON:  08/06/2022 FINDINGS: Lower chest: Streaky basilar scarring changes. No infiltrates or effusions. No pericardial effusion. Hepatobiliary: No hepatic lesions or intrahepatic biliary dilatation. The gallbladder is surgically absent. Mild associated common bile duct dilatation. Pancreas: No mass, inflammation or ductal dilatation. Spleen: Normal size.  No focal lesions. Adrenals/Urinary Tract: The adrenal glands are normal. Stable extensive renal cortical scarring changes but no findings for acute pyelonephritis. No hydronephrosis. No renal or obstructing ureteral calculi. No bladder calculi. Moderate bladder wall thickening could be due to lack of distension or cystitis. Recommend correlation with urinalysis. Stomach/Bowel: The stomach, duodenum, small bowel and colon are grossly normal without oral contrast. No inflammatory changes, mass lesions or obstructive findings. The appendix is normal. Vascular/Lymphatic: Stable atherosclerotic calcifications involving the aorta and branch vessels but no aneurysm or dissection. The major venous structures are patent. No abdominal or pelvic adenopathy. Reproductive: The uterus is surgically absent. The right ovary is not identified. The left ovary is still present and appears normal. Other: Small periumbilical abdominal wall hernia containing fat. No pelvic ascites. Musculoskeletal: Stable surgical changes involving the lumbar spine. No acute bony findings. IMPRESSION: 1. No acute  abdominal/pelvic findings, mass lesions or adenopathy. 2. Stable extensive renal cortical scarring changes but no findings for acute pyelonephritis. 3. Moderate bladder wall thickening could be due to lack of distension or cystitis. Recommend correlation with urinalysis. 4. Status post cholecystectomy with mild associated common bile duct dilatation. Aortic Atherosclerosis (ICD10-I70.0). Electronically Signed   By: Rudie Meyer M.D.   On: 10/30/2022 15:06    Procedures Procedures  {Document cardiac monitor, telemetry assessment procedure when appropriate:1}  Medications Ordered in ED Medications  cefTRIAXone (ROCEPHIN) 1 g in sodium chloride 0.9 % 100 mL IVPB (1 g Intravenous New Bag/Given 10/30/22 1509)  oxyCODONE (Oxy IR/ROXICODONE) immediate release tablet 5 mg (has no administration in time range)  fentaNYL (SUBLIMAZE) injection 25 mcg (25 mcg Intravenous Given 10/30/22 1323)  ondansetron (ZOFRAN) injection 4 mg (4 mg Intravenous Given 10/30/22 1323)  iohexol (OMNIPAQUE) 300 MG/ML solution 100 mL (75 mLs Intravenous Contrast Given 10/30/22 1333)    ED Course/ Medical Decision Making/ A&P   {   Click here for ABCD2, HEART and other calculatorsREFRESH Note before signing :1}  Medical Decision Making Amount and/or Complexity of Data Reviewed Labs: ordered. Radiology: ordered.  Risk Prescription drug management.   ***  {Document critical care time when appropriate:1} {Document review of labs and clinical decision tools ie heart score, Chads2Vasc2 etc:1}  {Document your independent review of radiology images, and any outside records:1} {Document your discussion with family members, caretakers, and with consultants:1} {Document social determinants of health affecting pt's care:1} {Document your decision making why or why not admission, treatments were needed:1} Final Clinical Impression(s) / ED Diagnoses Final diagnoses:  None    Rx / DC Orders ED  Discharge Orders     None

## 2022-10-30 NOTE — ED Triage Notes (Signed)
She reports mid abd. Pain at times radiating to luq/left flank areas "worse at night". She denies fever, and is in no distress.

## 2022-10-30 NOTE — ED Notes (Signed)
Pt discharged in stable condition. Pt and family member expressed understanding about discharge instructions and Rx, and to follow up with pcp and to return to ER for any further concerns or complications. Pt brought out to lobby via w/c.

## 2022-11-10 LAB — SUSCEPTIBILITY RESULT

## 2022-11-10 LAB — SUSCEPTIBILITY, AER + ANAEROB

## 2022-11-16 LAB — URINE CULTURE: Culture: >=100000 — AB

## 2022-12-06 ENCOUNTER — Ambulatory Visit: Payer: 59 | Admitting: Podiatry

## 2022-12-06 NOTE — Progress Notes (Deleted)
Name: Nancy [name]  Age/ Sex: 82 y.o., female   MRN/ DOB: [id number], 20-Dec-1940     PCP: Hillery Aldo, NP   Reason for Endocrinology Evaluation: Type 2 Diabetes Mellitus  Initial Endocrine Consultative Visit: 10/31/2017    PATIENT IDENTIFIER: Nancy [name] is a 82 y.o. female with a past medical history of HTN, Gout, Hyperlipidemia, and T2 DM . The patient has followed with Endocrinology clinic since 10/31/17 for consultative assistance with management of her diabetes.  DIABETIC HISTORY:  Nancy [name] was diagnosed with T2DM ~ in 2009, she has been on oral glycemic agents since her diagnosis. Has not been on insulin prior to her presentation here. Her hemoglobin A1c has ranged from 7.3% in 09/2015, peaking at 8.6% in 08/2015.  Glipizide stopped 06/2019 with an A1c of 6.0 %, but had to be started 07/2022 due to hypoglycemia  Nancy [name] 09/24/2020 Started Rybelsus May 2023 SUBJECTIVE:   During the last visit (08/13/2022): A1c 8.0%     Today (12/06/2022): Nancy [name] is here for a  follow up on diabetes management. She has been checking glucose occasionally.    She continues to follow up with the pain clinic  She follows with  cardiology  for nonobstructive CAD She was hospitalized for sepsis due to UTI 07/2022, and cystitis 10/2022 She also had a syncopal episode 07/2022, due to sinus bradycardia, BG at the time 255 mg/DL   She noted "feeling bad" after eating while on Rybelsus and has not ben taking it on daily basis  Has occasional constipation  She has not been taking Glipizide since hospitalization   HOME DIABETES REGIMEN:  Metfomrin 1000 mg daily Glipizide 5 mg daily Rybelsus 7 mg daily Farxiga 10 mg daily    METER DOWNLOAD SUMMARY: 144-327 mg/dL    DIABETIC COMPLICATIONS: Microvascular complications:  Neuropathy Denies: retinopathy, nephropathy Last eye exam: Completed 08/2020 scheduled 02/2022     Macrovascular complications:    CAD(nonobstructive CAD) Denies: PVD, CVA    HISTORY:  Past Medical History:  Past Medical History:  Diagnosis Date   Allergy    Anemia    Aortic atherosclerosis (HCC)    Atrial tachycardia (HCC)    Breast cyst    left breast   CAD (coronary artery disease), native coronary artery    minimal ASCAD to the distal LM and mid LAD with 0-24% stenosis and Calcium score of 26.   Chest pain    CKD (chronic kidney disease), stage III (HCC)    Complication of anesthesia 1986   slow to awaken after cholecystectomy   Diastolic dysfunction    with mild pulmonary HTN by echo 07/2011   Gastric ulcer    GERD (gastroesophageal reflux disease)    Gout    Hyperlipidemia    Hypertension    Hypertensive heart disease    Neuropathy    from diabetes per patient   Osteoarthritis    Osteopenia    Shortness of breath dyspnea    with activity   Sinus pause    Spine deformity    lower spine   Type II diabetes mellitus (HCC)    Past Surgical History:  Past Surgical History:  Procedure Laterality Date   ABDOMINAL ADHESION SURGERY     ABDOMINAL HYSTERECTOMY     ANUS SURGERY     for torn tissue   APPENDECTOMY     BREAST BIOPSY     BREAST LUMPECTOMY     left   CHOLECYSTECTOMY  1986  COLONOSCOPY     ESOPHAGOGASTRODUODENOSCOPY (EGD) WITH PROPOFOL N/A 07/18/2013   Procedure: ESOPHAGOGASTRODUODENOSCOPY (EGD) WITH PROPOFOL;  Surgeon: Willis Modena, MD;  Location: [facility];  Service: Endoscopy;  Laterality: N/A;   EUS N/A 07/18/2013   Procedure: ESOPHAGEAL ENDOSCOPIC ULTRASOUND (EUS) RADIAL;  Surgeon: Willis Modena, MD;  Location: [facility];  Service: Endoscopy;  Laterality: N/A;   IR GENERIC HISTORICAL  10/30/2015   IR US GUIDE VASC ACCESS RIGHT 10/30/2015 Barnetta Chapel, PA-C [facility]   IR GENERIC HISTORICAL  10/30/2015   IR FLUORO GUIDE CV LINE RIGHT 10/30/2015 Barnetta Chapel, PA-C [facility]   LUMBAR WOUND DEBRIDEMENT N/A 10/01/2015   Procedure: LUMBAR WOUND DEBRIDEMENT;  Surgeon:  Venita Lick, MD;  Location: [facility];  Service: Orthopedics;  Laterality: N/A;   LUMBAR WOUND DEBRIDEMENT N/A 10/15/2015   Procedure: [facility] OUT AND CLOSURE OF BACK WOUND;  Surgeon: Venita Lick, MD;  Location: [facility];  Service: Orthopedics;  Laterality: N/A;   MASS EXCISION Left 07/24/2015   Procedure: EXCISION MASS LEFT LONG FINGER;  Surgeon: Betha Loa, MD;  Location: [facility];  Service: Orthopedics;  Laterality: Left;   SPINAL FUSION N/A 09/17/2015   Procedure: FUSION POSTERIOR SPINAL MULTILEVEL L4- S1;  Surgeon: Venita Lick, MD;  Location: [facility];  Service: Orthopedics;  Laterality: N/A;   TRANSFORAMINAL LUMBAR INTERBODY FUSION (TLIF) WITH PEDICLE SCREW FIXATION 1 LEVEL N/A 09/17/2015   Procedure: TRANSFORAMINAL LUMBAR INTERBODY FUSION (TLIF) WITH PEDICLE SCREW FIXATION 1 LEVEL , Lumbar 4-5;  Surgeon: Venita Lick, MD;  Location: [facility];  Service: Orthopedics;  Laterality: N/A;   Social History:  reports that she has never smoked. She has never used smokeless tobacco. She reports that she does not drink alcohol and does not use drugs. Family History:  Family History  Problem Relation Age of Onset   Heart disease Mother    Cancer Sister        ovarian     HOME MEDICATIONS: Allergies as of 12/07/2022       Reactions   Ketorolac Tromethamine Swelling   10/02/15 - given Toradol + morphine and RN noted tongue swelling - given Epi + Benadryl Has taken ibuprofen in past w/o problem Other reaction(s): Unknown   Morphine Swelling   Face and tongue swelling   Morphine Sulfate    Other reaction(s): Unknown   Codeine Other (See Comments)   headache Other reaction(s): Unknown        Medication List        Accurate as of December 06, 2022 11:37 AM. If you have any questions, ask your nurse or doctor.          allopurinol 300 MG tablet Commonly known as: ZYLOPRIM Take 300 mg by mouth daily.   amLODipine 5 MG tablet Commonly known as: NORVASC Take 5 mg by mouth  daily.   aspirin EC 81 MG tablet Take 81 mg by mouth daily.   atorvastatin 10 MG tablet Commonly known as: LIPITOR Take 1 tablet by mouth once daily   CareSens N Glucose Test test strip Generic drug: glucose blood 1 each by Other route daily in the afternoon. Use as instructed   colchicine 0.6 MG tablet Take 0.6 mg by mouth daily.   dapagliflozin propanediol 10 MG Tabs tablet Commonly known as: Farxiga Take 1 tablet (10 mg total) by mouth daily before breakfast.   gabapentin 300 MG capsule Commonly known as: NEURONTIN Take 600 mg by mouth 3 (three) times daily.   glipiZIDE 5 MG  tablet Commonly known as: GLUCOTROL Take 1 tablet (5 mg total) by mouth daily before breakfast.   losartan 25 MG tablet Commonly known as: COZAAR Take 25 mg by mouth at bedtime.   metFORMIN 1000 MG tablet Commonly known as: GLUCOPHAGE Take 1 tablet (1,000 mg total) by mouth daily with breakfast.   metoprolol tartrate 50 MG tablet Commonly known as: LOPRESSOR Take 0.5 tablets (25 mg total) by mouth 2 (two) times daily.   Multiple Vitamins tablet Take 1 tablet by mouth daily.   omeprazole 20 MG capsule Commonly known as: PRILOSEC Take 20 mg by mouth daily as needed (acid reflux).   ondansetron 4 MG disintegrating tablet Commonly known as: ZOFRAN-ODT Take 1 tablet (4 mg total) by mouth every 8 (eight) hours as needed for nausea or vomiting.   OneTouch Verio IQ System w/Device Kit 1 kit by Does not apply route 2 (two) times daily. E11.65   phenazopyridine 200 MG tablet Commonly known as: PYRIDIUM Take 1 tablet (200 mg total) by mouth 3 (three) times daily.   polyethylene glycol 17 g packet Commonly known as: MIRALAX / GLYCOLAX Take 17 g by mouth 2 (two) times daily.   Rybelsus 7 MG Tabs Generic drug: Semaglutide Take 1 tablet (7 mg total) by mouth daily.   senna-docusate 8.6-50 MG tablet Commonly known as: Senokot-S Take 1 tablet by mouth 2 (two) times daily.          OBJECTIVE:   Vital Signs: There were no vitals taken for this visit.  Wt Readings from Last 3 Encounters:  08/13/22 176 lb (79.8 kg)  08/05/22 178 lb 9.2 oz (81 kg)  07/22/22 177 lb (80.3 kg)     Exam: General: Pt appears well and is in NAD  Lungs: Clear with good BS bilat with no rales, rhonchi, or wheezes  Heart: RRR   Extremities: No  pretibial edema.   Neuro: MS is good with appropriate affect, pt is alert and Ox3    DM foot exam: per podiatry 05/18/2022  The skin of the feet is intact without sores or ulcerations. The pedal pulses are 2+ on right and 2+ on left. The sensation is decreased  to a screening 5.07, 10 gram monofilament at the left great toe         DATA REVIEWED:  Labs done at PCP office, these are not available at this time   ASSESSMENT / PLAN / RECOMMENDATIONS:   1) Type 2 Diabetes Mellitus, poorly controlled, With Neuropathic and macrovascular complications - Most recent A1c of 8.0  %. Goal A1c < 7.0 %.    -Her A1c has trended up, she recently had her A1c at PCPs office at 8.0%, records not available at this time  - We had reduced her metformin by 50% in the past due to GFR less than 45 -Poorly controlled diabetes due to medication nonadherence, she has been using Rybelsus on every day basis, she has noted feeling different after eating while on Rybelsus, she also forgot to take her glipizide after her hospital discharge -I did recommend switching Rybelsus to Ozempic or Mounjaro, but she has plenty of vials at home and would like to finish this first -I have encouraged the patient to restart glipizide again   MEDICATIONS: -Continue Rybelsus  7 mg daily  -Continue metformin 1000 mg ONCE daily  -Continue  Farxiga 10 mg daily -Restart glipizide 5 mg daily   EDUCATION / INSTRUCTIONS: BG monitoring instructions: Patient is instructed to check her blood sugars 1  times a day, fasting  Call [facility] if: BG persistently < 70   I reviewed the Rule of 15 for the treatment of hypoglycemia in detail with the patient. Literature supplied.   2) Diabetic complications:  Eye: Does not have known diabetic retinopathy.  Neuro/ Feet: Does  have known diabetic peripheral neuropathy. Renal: Patient does  have known baseline CKD. She is on an ACEI/ARB at present. Normal Ma/Cr ratio       F/U in 6 months   Signed electronically by: Lyndle Herrlich, MD  [facility] Endocrinology  [facility] [address] [place] [place], [place] [address] Phone: [phone] FAX: [phone]   CC:  Hillery Aldo, NP [address] [place] [address] Phone: [phone]  Fax: [phone]  Return to Endocrinology clinic as below: Future Appointments  Date Time Provider Department Center  12/07/2022 10:30 AM Jaidan Stachnik, Konrad Dolores, MD [facility] None  01/10/2023  2:15 PM Vivi Barrack, DPM [facility]

## 2022-12-07 ENCOUNTER — Ambulatory Visit: Payer: 59 | Admitting: Internal Medicine

## 2023-01-10 ENCOUNTER — Ambulatory Visit: Payer: 59 | Admitting: Podiatry

## 2023-01-20 ENCOUNTER — Ambulatory Visit: Payer: 59 | Admitting: Podiatry

## 2023-02-07 ENCOUNTER — Ambulatory Visit (INDEPENDENT_AMBULATORY_CARE_PROVIDER_SITE_OTHER): Payer: 59 | Admitting: Podiatry

## 2023-02-07 DIAGNOSIS — M79675 Pain in left toe(s): Secondary | ICD-10-CM

## 2023-02-07 DIAGNOSIS — B351 Tinea unguium: Secondary | ICD-10-CM | POA: Diagnosis not present

## 2023-02-07 DIAGNOSIS — E1149 Type 2 diabetes mellitus with other diabetic neurological complication: Secondary | ICD-10-CM

## 2023-02-07 DIAGNOSIS — M79674 Pain in right toe(s): Secondary | ICD-10-CM | POA: Diagnosis not present

## 2023-02-08 NOTE — Progress Notes (Signed)
Subjective: No chief complaint on file.   83 year old female presents for concerns of blood, elongated nails that she is not able to trim her self.  No spontaneous or drainage.  No open lesions.  She feels the neuropathy is about the same maybe get a little bit worse.  No other concerns today.  Objective: AAO x3, NAD DP/PT pulses palpable bilaterally, CRT less than 3 seconds Sensation decreased with Semmes Weinstein monofilament Chronic edema present bilaterally. Nails are hypertrophic, dystrophic, brittle, discolored, elongated 10. No surrounding redness or drainage. Tenderness nails 1-5 bilaterally. No open lesions or pre-ulcerative lesions are identified today. No significant pain otherwise today. No pain with calf compression, swelling, warmth, erythema  Assessment: 83 year old female with symptomatic onychomycosis, neuropathy  Plan: -All treatment options discussed with the patient including all alternatives, risks, complications.  -Continue topical compound cream through [facility] for neuropathy.  Discussed adjusting medications or keeping same for now. -Sharply debrided the nails x 10 without any complications or bleeding -Patient encouraged to call the office with any questions, concerns, change in symptoms.   Vivi Barrack DPM

## 2023-02-09 ENCOUNTER — Ambulatory Visit (INDEPENDENT_AMBULATORY_CARE_PROVIDER_SITE_OTHER): Payer: 59 | Admitting: Internal Medicine

## 2023-02-09 ENCOUNTER — Encounter: Payer: Self-pay | Admitting: Internal Medicine

## 2023-02-09 VITALS — BP 130/70 | HR 76 | Ht 62.0 in | Wt 172.0 lb

## 2023-02-09 DIAGNOSIS — E119 Type 2 diabetes mellitus without complications: Secondary | ICD-10-CM | POA: Insufficient documentation

## 2023-02-09 DIAGNOSIS — E1142 Type 2 diabetes mellitus with diabetic polyneuropathy: Secondary | ICD-10-CM

## 2023-02-09 DIAGNOSIS — Z7984 Long term (current) use of oral hypoglycemic drugs: Secondary | ICD-10-CM | POA: Diagnosis not present

## 2023-02-09 DIAGNOSIS — E1159 Type 2 diabetes mellitus with other circulatory complications: Secondary | ICD-10-CM

## 2023-02-09 LAB — POCT GLYCOSYLATED HEMOGLOBIN (HGB A1C): Hemoglobin A1C: 6.6 % — AB (ref 4.0–5.6)

## 2023-02-09 LAB — POCT GLUCOSE (DEVICE FOR HOME USE): Glucose Fasting, POC: 172 mg/dL — AB (ref 70–99)

## 2023-02-09 MED ORDER — GLIPIZIDE 5 MG PO TABS: 5.0000 mg | ORAL_TABLET | Freq: Every day | ORAL | 3 refills | Status: DC

## 2023-02-09 MED ORDER — METFORMIN HCL 1000 MG PO TABS: 1000.0000 mg | ORAL_TABLET | Freq: Every day | ORAL | 3 refills | Status: DC

## 2023-02-09 MED ORDER — RYBELSUS 7 MG PO TABS: 7.0000 mg | ORAL_TABLET | Freq: Every day | ORAL | 3 refills | Status: DC

## 2023-02-09 NOTE — Patient Instructions (Addendum)
-   Rybelsus 7 mg daily  - Glipizide 5 mg, 1 tablet daily  - Continue Metformin 1000 mg one tablet day  - Stop Farxiga 10  mg, 1 tablet daliy   - HOW TO TREAT LOW BLOOD SUGARS (Blood sugar LESS THAN 70 MG/DL) Please follow the RULE OF 15 for the treatment of hypoglycemia treatment (when your (blood sugars are less than 70 mg/dL)   STEP 1: Take 15 grams of carbohydrates when your blood sugar is low, which includes:  3-4 GLUCOSE TABS  OR 3-4 OZ OF JUICE OR REGULAR SODA OR ONE TUBE OF GLUCOSE GEL    STEP 2: RECHECK blood sugar in 15 MINUTES STEP 3: If your blood sugar is still low at the 15 minute recheck --> then, go back to STEP 1 and treat AGAIN with another 15 grams of carbohydrates.

## 2023-02-09 NOTE — Progress Notes (Signed)
Name: Nancy [name]  Age/ Sex: 83 y.o., female   MRN/ DOB: [id number], November 16, 1940     PCP: Hillery Aldo, NP   Reason for Endocrinology Evaluation: Type 2 Diabetes Mellitus  Initial Endocrine Consultative Visit: 10/31/2017    PATIENT IDENTIFIER: [name] is a 83 y.o. female with a past medical history of HTN, Gout, Hyperlipidemia, and T2 DM . The patient has followed with Endocrinology clinic since 10/31/17 for consultative assistance with management of her diabetes.  DIABETIC HISTORY:  Nancy [name] was diagnosed with T2DM ~ in 2009, she has been on oral glycemic agents since her diagnosis. Has not been on insulin prior to her presentation here. Her hemoglobin A1c has ranged from 7.3% in 09/2015, peaking at 8.6% in 08/2015.  Glipizide stopped 06/2019 with an A1c of 6.0 %, but had to be started 07/2022 due to hypoglycemia  [name] 09/24/2020 Started Rybelsus May 2023    SUBJECTIVE:   During the last visit (08/13/2022): A1c 8.0%     Today (02/09/2023): [name] is here for a  follow up on diabetes management. She has been checking glucose occasionally.    She follows with  cardiology  for nonobstructive CAD Had a follow-up with podiatry 02/07/2023  She is on Losartan through nephrology   She was hospitalized for sepsis due to UTI 07/2022, and again in 10/2022  Denies nausea or vomiting  Denies constipation or diarrhea  She feels light headed when she eats   HOME DIABETES REGIMEN:  Metfomrin 1000 mg daily Glipizide 5 mg daily Rybelsus 7 mg daily Farxiga 10 mg daily    METER DOWNLOAD SUMMARY: 144-327 mg/dL    DIABETIC COMPLICATIONS: Microvascular complications:  Neuropathy Denies: retinopathy, nephropathy Last eye exam: Completed 08/2020 scheduled 02/2022     Macrovascular complications:   CAD(nonobstructive CAD) Denies: PVD, CVA    HISTORY:  Past Medical History:  Past Medical History:  Diagnosis Date   Allergy    Anemia     Aortic atherosclerosis (HCC)    Atrial tachycardia (HCC)    Breast cyst    left breast   CAD (coronary artery disease), native coronary artery    minimal ASCAD to the distal LM and mid LAD with 0-24% stenosis and Calcium score of 26.   Chest pain    CKD (chronic kidney disease), stage III (HCC)    Complication of anesthesia 1986   slow to awaken after cholecystectomy   Diastolic dysfunction    with mild pulmonary HTN by echo 07/2011   Gastric ulcer    GERD (gastroesophageal reflux disease)    Gout    Hyperlipidemia    Hypertension    Hypertensive heart disease    Neuropathy    from diabetes per patient   Osteoarthritis    Osteopenia    Shortness of breath dyspnea    with activity   Sinus pause    Spine deformity    lower spine   Type II diabetes mellitus (HCC)    Past Surgical History:  Past Surgical History:  Procedure Laterality Date   ABDOMINAL ADHESION SURGERY     ABDOMINAL HYSTERECTOMY     ANUS SURGERY     for torn tissue   APPENDECTOMY     BREAST BIOPSY     BREAST LUMPECTOMY     left   CHOLECYSTECTOMY  1986   COLONOSCOPY     ESOPHAGOGASTRODUODENOSCOPY (EGD) WITH PROPOFOL N/A 07/18/2013   Procedure: ESOPHAGOGASTRODUODENOSCOPY (EGD) WITH PROPOFOL;  Surgeon: Willis Modena, MD;  [facility]: [facility];  Service: Endoscopy;  Laterality: N/A;   EUS N/A 07/18/2013   Procedure: ESOPHAGEAL ENDOSCOPIC ULTRASOUND (EUS) RADIAL;  Surgeon: Willis Modena, MD;  [facility]: [facility];  Service: Endoscopy;  Laterality: N/A;   IR GENERIC HISTORICAL  10/30/2015   IR US GUIDE VASC ACCESS RIGHT 10/30/2015 Barnetta Chapel, PA-C [facility]   IR GENERIC HISTORICAL  10/30/2015   IR FLUORO GUIDE CV LINE RIGHT 10/30/2015 Barnetta Chapel, PA-C [facility]   LUMBAR WOUND DEBRIDEMENT N/A 10/01/2015   Procedure: LUMBAR WOUND DEBRIDEMENT;  Surgeon: Venita Lick, MD;  [facility];  Service: Orthopedics;  Laterality: N/A;   LUMBAR WOUND DEBRIDEMENT N/A 10/15/2015   Procedure: [facility] OUT AND  CLOSURE OF BACK WOUND;  Surgeon: Venita Lick, MD;  [facility];  Service: Orthopedics;  Laterality: N/A;   MASS EXCISION Left 07/24/2015   Procedure: EXCISION MASS LEFT LONG FINGER;  Surgeon: Betha Loa, MD;  Location: [facility];  Service: Orthopedics;  Laterality: Left;   SPINAL FUSION N/A 09/17/2015   Procedure: FUSION POSTERIOR SPINAL MULTILEVEL L4- S1;  Surgeon: Venita Lick, MD;  [facility];  Service: Orthopedics;  Laterality: N/A;   TRANSFORAMINAL LUMBAR INTERBODY FUSION (TLIF) WITH PEDICLE SCREW FIXATION 1 LEVEL N/A 09/17/2015   Procedure: TRANSFORAMINAL LUMBAR INTERBODY FUSION (TLIF) WITH PEDICLE SCREW FIXATION 1 LEVEL , Lumbar 4-5;  Surgeon: Venita Lick, MD;  [facility];  Service: Orthopedics;  Laterality: N/A;   Social History:  reports that she has never smoked. She has never used smokeless tobacco. She reports that she does not drink alcohol and does not use drugs. Family History:  Family History  Problem Relation Age of Onset   Heart disease Mother    Cancer Sister        ovarian     HOME MEDICATIONS: Allergies as of 02/09/2023       Reactions   Ketorolac Tromethamine Swelling   10/02/15 - given Toradol + morphine and RN noted tongue swelling - given Epi + Benadryl Has taken ibuprofen in past w/o problem Other reaction(s): Unknown   Morphine Swelling   Face and tongue swelling   Morphine Sulfate    Other reaction(s): Unknown   Codeine Other (See Comments)   headache Other reaction(s): Unknown        Medication List        Accurate as of February 09, 2023 11:46 AM. If you have any questions, ask your nurse or doctor.          allopurinol 300 MG tablet Commonly known as: ZYLOPRIM Take 300 mg by mouth daily.   allopurinol 100 MG tablet Commonly known as: ZYLOPRIM Take 100 mg by mouth daily.   amLODipine 5 MG tablet Commonly known as: NORVASC Take 5 mg by mouth daily.   amLODipine 10 MG tablet Commonly known as:  NORVASC Take 10 mg by mouth daily.   aspirin EC 81 MG tablet Take 81 mg by mouth daily.   atorvastatin 10 MG tablet Commonly known as: LIPITOR Take 1 tablet by mouth once daily   CareSens N Glucose Test test strip Generic drug: glucose blood 1 each by Other route daily in the afternoon. Use as instructed   colchicine 0.6 MG tablet Take 0.6 mg by mouth daily.   dapagliflozin propanediol 10 MG Tabs tablet Commonly known as: Farxiga Take 1 tablet (10 mg total) by mouth daily before breakfast.   furosemide 20 MG tablet Commonly known as: LASIX Take 20 mg by mouth daily  as needed.   gabapentin 300 MG capsule Commonly known as: NEURONTIN Take 600 mg by mouth 3 (three) times daily.   glipiZIDE 5 MG tablet Commonly known as: GLUCOTROL Take 1 tablet (5 mg total) by mouth daily before breakfast.   losartan 25 MG tablet Commonly known as: COZAAR Take 25 mg by mouth at bedtime.   metFORMIN 1000 MG tablet Commonly known as: GLUCOPHAGE Take 1 tablet (1,000 mg total) by mouth daily with breakfast.   metoprolol tartrate 50 MG tablet Commonly known as: LOPRESSOR Take 0.5 tablets (25 mg total) by mouth 2 (two) times daily.   Multiple Vitamins tablet Take 1 tablet by mouth daily.   omeprazole 20 MG capsule Commonly known as: PRILOSEC Take 20 mg by mouth daily as needed (acid reflux).   ondansetron 4 MG disintegrating tablet Commonly known as: ZOFRAN-ODT Take 1 tablet (4 mg total) by mouth every 8 (eight) hours as needed for nausea or vomiting.   OneTouch Verio IQ System w/Device Kit 1 kit by Does not apply route 2 (two) times daily. E11.65   phenazopyridine 200 MG tablet Commonly known as: PYRIDIUM Take 1 tablet (200 mg total) by mouth 3 (three) times daily.   polyethylene glycol 17 g packet Commonly known as: MIRALAX / GLYCOLAX Take 17 g by mouth 2 (two) times daily.   Rybelsus 7 MG Tabs Generic drug: Semaglutide Take 1 tablet (7 mg total) by mouth daily.    senna-docusate 8.6-50 MG tablet Commonly known as: Senokot-S Take 1 tablet by mouth 2 (two) times daily.         OBJECTIVE:   Vital Signs: BP 130/70 (BP Location: Left Arm, Patient Position: Sitting)   Pulse 76   Ht 5\' 2"  (1.575 m)   Wt 172 lb (78 kg)   SpO2 97%   BMI 31.46 kg/m   Wt Readings from Last 3 Encounters:  02/09/23 172 lb (78 kg)  08/13/22 176 lb (79.8 kg)  08/05/22 178 lb 9.2 oz (81 kg)     Exam: General: Pt appears well and is in NAD  Lungs: Clear with good BS bilat   Heart: RRR   Extremities: Trace   pretibial edema.   Neuro: MS is good with appropriate affect, pt is alert and Ox3    DM foot exam: per podiatry 02/07/2023   DATA REVIEWED:   Latest Reference Range & Units 10/30/22 12:06  Sodium 135 - 145 mmol/L 135  Potassium 3.5 - 5.1 mmol/L 3.1 (L)  Chloride 98 - 111 mmol/L 98  CO2 22 - 32 mmol/L 28  Glucose 70 - 99 mg/dL 098 (H)  BUN 8 - 23 mg/dL 13  Creatinine 1.19 - 1.47 mg/dL 8.29  Calcium 8.9 - 56.2 mg/dL 9.9  Anion gap 5 - 15  9  Alkaline Phosphatase 38 - 126 U/L 98  Albumin 3.5 - 5.0 g/dL 4.1  Lipase 11 - 51 U/L 24  AST 15 - 41 U/L 41  ALT 0 - 44 U/L 35  Total Protein 6.5 - 8.1 g/dL 7.4  Total Bilirubin 0.3 - 1.2 mg/dL 1.7 (H)  GFR, Estimated >60 mL/min >60  (L): Data is abnormally low (H): Data is abnormally high    Labs done at PCP office, these are not available at this time   ASSESSMENT / PLAN / RECOMMENDATIONS:   1) Type 2 Diabetes Mellitus, Optimally  controlled, With Neuropathic and macrovascular complications - Most recent A1c of 6.6  %. Goal A1c < 7.0 %.    -A1c  remains optimal with no evidence of hypoglycemia  - We had reduced her metformin by 50% in the past due to GFR less than 45 -I did recommend switching Rybelsus to Ozempic or Mounjaro in the past, but we opted to remain on Rybelsus at this time -I have recommended discontinuing Marcelline Deist due to recurrent UTI and sepsis -I have also recommended increasing  Rybelsus, but daughter would like to remain on current dose, as the patient complains of lightheadedness after eating at times?    MEDICATIONS: -Stop Farxiga -Continue Rybelsus  7 mg daily  -Continue metformin 1000 mg ONCE daily  -Continue glipizide 5 mg daily   EDUCATION / INSTRUCTIONS: BG monitoring instructions: Patient is instructed to check her blood sugars 1 times a day, fasting  Call [facility] if: BG persistently < 70  I reviewed the Rule of 15 for the treatment of hypoglycemia in detail with the patient. Literature supplied.   2) Diabetic complications:  Eye: Does not have known diabetic retinopathy.  Neuro/ Feet: Does  have known diabetic peripheral neuropathy. Renal: Patient does  have known baseline CKD. She is on an ACEI/ARB at present. Normal Ma/Cr ratio       F/U in 3 months   Signed electronically by: Lyndle Herrlich, MD  [facility] Endocrinology  [facility] [address] [place] [place], [place] [address] Phone: [phone] FAX: [phone]   CC:  Hillery Aldo, NP [address] [place] [address] Phone: [phone]  Fax: [phone]  Return to Endocrinology clinic as below: Future Appointments  Date Time Provider Department Center  05/09/2023 11:15 AM Vivi Barrack, DPM [facility]

## 2023-03-03 ENCOUNTER — Ambulatory Visit: Payer: 59 | Admitting: Internal Medicine

## 2023-05-09 ENCOUNTER — Encounter: Payer: Self-pay | Admitting: Podiatry

## 2023-05-09 ENCOUNTER — Ambulatory Visit (INDEPENDENT_AMBULATORY_CARE_PROVIDER_SITE_OTHER): Payer: 59 | Admitting: Podiatry

## 2023-05-09 DIAGNOSIS — M79674 Pain in right toe(s): Secondary | ICD-10-CM | POA: Diagnosis not present

## 2023-05-09 DIAGNOSIS — B351 Tinea unguium: Secondary | ICD-10-CM | POA: Diagnosis not present

## 2023-05-09 DIAGNOSIS — M79675 Pain in left toe(s): Secondary | ICD-10-CM | POA: Diagnosis not present

## 2023-05-09 DIAGNOSIS — E1149 Type 2 diabetes mellitus with other diabetic neurological complication: Secondary | ICD-10-CM

## 2023-05-09 NOTE — Progress Notes (Signed)
 Subjective: Chief Complaint  Patient presents with   [facility]    RM#11 [facility] nail follow up no concerns at this time.     83 year old female presents for concerns of blood, elongated nails that she is not able to trim her self.  No spontaneous or drainage.  No open lesions.  She feels the neuropathy is about the same. States she does not take her gabapentin  as consistently as she should.   Objective: AAO x3, NAD DP/PT pulses palpable bilaterally, CRT less than 3 seconds Sensation decreased with Semmes Weinstein monofilament Nails are hypertrophic, dystrophic, brittle, discolored, elongated 10. No surrounding redness or drainage. Tenderness nails 1-5 bilaterally. No open lesions or pre-ulcerative lesions are identified today. No significant pain otherwise today. No pain with calf compression, swelling, warmth, erythema  Assessment: 83 year old female with symptomatic onychomycosis, neuropathy  Plan: -All treatment options discussed with the patient including all alternatives, risks, complications.  -Sharply debrided the nails x 10 without any complications or bleeding -Discussed taking gabapentin  as prescribed.  -Patient encouraged to call the office with any questions, concerns, change in symptoms.   Charity Conch DPM

## 2023-05-10 ENCOUNTER — Ambulatory Visit (INDEPENDENT_AMBULATORY_CARE_PROVIDER_SITE_OTHER): Payer: 59 | Admitting: Internal Medicine

## 2023-05-10 ENCOUNTER — Encounter: Payer: Self-pay | Admitting: Internal Medicine

## 2023-05-10 VITALS — BP 130/80 | HR 91

## 2023-05-10 DIAGNOSIS — Z7985 Long-term (current) use of injectable non-insulin antidiabetic drugs: Secondary | ICD-10-CM

## 2023-05-10 DIAGNOSIS — E1165 Type 2 diabetes mellitus with hyperglycemia: Secondary | ICD-10-CM | POA: Diagnosis not present

## 2023-05-10 DIAGNOSIS — Z7984 Long term (current) use of oral hypoglycemic drugs: Secondary | ICD-10-CM

## 2023-05-10 DIAGNOSIS — E1159 Type 2 diabetes mellitus with other circulatory complications: Secondary | ICD-10-CM | POA: Diagnosis not present

## 2023-05-10 DIAGNOSIS — E1142 Type 2 diabetes mellitus with diabetic polyneuropathy: Secondary | ICD-10-CM | POA: Diagnosis not present

## 2023-05-10 LAB — POCT GLYCOSYLATED HEMOGLOBIN (HGB A1C): Hemoglobin A1C: 7.8 % — AB (ref 4.0–5.6)

## 2023-05-10 LAB — POCT GLUCOSE (DEVICE FOR HOME USE): POC Glucose: 230 mg/dL — AB (ref 70–99)

## 2023-05-10 MED ORDER — METFORMIN HCL 1000 MG PO TABS: 1000.0000 mg | ORAL_TABLET | Freq: Every day | ORAL | 3 refills | Status: DC

## 2023-05-10 MED ORDER — GLIPIZIDE 5 MG PO TABS: 5.0000 mg | ORAL_TABLET | Freq: Every day | ORAL | 3 refills | Status: DC

## 2023-05-10 MED ORDER — SEMAGLUTIDE(0.25 OR 0.5MG/DOS) 2 MG/3ML ~~LOC~~ SOPN: 0.5000 mg | PEN_INJECTOR | SUBCUTANEOUS | 3 refills | Status: DC

## 2023-05-10 NOTE — Progress Notes (Signed)
 Name: Nancy [name]  Age/ Sex: 83 y.o., female   MRN/ DOB: [id number], 12-05-1940     PCP: Nancy Snare, NP   Reason for Endocrinology Evaluation: Type 2 Diabetes Mellitus  Initial Endocrine Consultative Visit: 10/31/2017    PATIENT IDENTIFIER: Nancy [name] is a 83 y.o. female with a past medical history of HTN, Gout, Hyperlipidemia, and T2 DM . The patient has followed with Endocrinology clinic since 10/31/17 for consultative assistance with management of her diabetes.  DIABETIC HISTORY:  Nancy [name] was diagnosed with T2DM ~ in 2009, she has been on oral glycemic agents since her diagnosis. Has not been on insulin  prior to her presentation here. Her hemoglobin A1c has ranged from 7.3% in 09/2015, peaking at 8.6% in 08/2015.  Glipizide  stopped 06/2019 with an A1c of 6.0 %, but had to be started 07/2022 due to hypoglycemia  Started Farxiga  09/24/2020 Started Rybelsus  May 2023 Discontinued Farxiga  01/2023 due to recurrent cystitis   SUBJECTIVE:   During the last visit (02/09/2023): A1c 6.6%     Today (05/10/2023): Nancy [name] is here for a  follow up on diabetes management. She has been checking glucose occasionally.    She follows with  cardiology  for nonobstructive CAD Had a follow-up with podiatry 05/09/2023 She is on Losartan  through nephrology   She had an episode of vomiting for 2 days after eating out, no fever   Denies constipation or diarrhea   She has pending intra-articular of the knees  she is complaining of bilateral finger pains  HOME DIABETES REGIMEN:  Metfomrin 1000 mg daily Glipizide  5 mg daily-not sure Rybelsus  7 mg daily   METER DOWNLOAD SUMMARY: N/A   DIABETIC COMPLICATIONS: Microvascular complications:  Neuropathy Denies: retinopathy, nephropathy Last eye exam: Completed 08/2020 scheduled 02/2022     Macrovascular complications:   CAD(nonobstructive CAD) Denies: PVD, CVA    HISTORY:  Past Medical History:  Past Medical  History:  Diagnosis Date   Allergy    Anemia    Aortic atherosclerosis (HCC)    Atrial tachycardia (HCC)    Breast cyst    left breast   CAD (coronary artery disease), native coronary artery    minimal ASCAD to the distal LM and mid LAD with 0-24% stenosis and Calcium  score of 26.   Chest pain    CKD (chronic kidney disease), stage III (HCC)    Complication of anesthesia 1986   slow to awaken after cholecystectomy   Diastolic dysfunction    with mild pulmonary HTN by echo 07/2011   Gastric ulcer    GERD (gastroesophageal reflux disease)    Gout    Hyperlipidemia    Hypertension    Hypertensive heart disease    Neuropathy    from diabetes per patient   Osteoarthritis    Osteopenia    Shortness of breath dyspnea    with activity   Sinus pause    Spine deformity    lower spine   Type II diabetes mellitus (HCC)    Past Surgical History:  Past Surgical History:  Procedure Laterality Date   ABDOMINAL ADHESION SURGERY     ABDOMINAL HYSTERECTOMY     ANUS SURGERY     for torn tissue   APPENDECTOMY     BREAST BIOPSY     BREAST LUMPECTOMY     left   CHOLECYSTECTOMY  1986   COLONOSCOPY     ESOPHAGOGASTRODUODENOSCOPY (EGD) WITH PROPOFOL  N/A 07/18/2013   Procedure: ESOPHAGOGASTRODUODENOSCOPY (EGD) WITH PROPOFOL ;  Surgeon: Evangeline Hilts, MD;  Location: [facility];  Service: Endoscopy;  Laterality: N/A;   EUS N/A 07/18/2013   Procedure: ESOPHAGEAL ENDOSCOPIC ULTRASOUND (EUS) RADIAL;  Surgeon: Evangeline Hilts, MD;  [facility];  Service: Endoscopy;  Laterality: N/A;   IR GENERIC HISTORICAL  10/30/2015   IR US  GUIDE VASC ACCESS RIGHT 10/30/2015 Marlin Simmonds, PA-C [facility]   IR GENERIC HISTORICAL  10/30/2015   IR FLUORO GUIDE CV LINE RIGHT 10/30/2015 Marlin Simmonds, PA-C [facility]   LUMBAR WOUND DEBRIDEMENT N/A 10/01/2015   Procedure: LUMBAR WOUND DEBRIDEMENT;  Surgeon: Mort Ards, MD;  [facility];  Service: Orthopedics;  Laterality: N/A;   LUMBAR WOUND  DEBRIDEMENT N/A 10/15/2015   Procedure: [facility] OUT AND CLOSURE OF BACK WOUND;  Surgeon: Mort Ards, MD;  [facility];  Service: Orthopedics;  Laterality: N/A;   MASS EXCISION Left 07/24/2015   Procedure: EXCISION MASS LEFT LONG FINGER;  Surgeon: Brunilda Capra, MD;  Location: [facility];  Service: Orthopedics;  Laterality: Left;   SPINAL FUSION N/A 09/17/2015   Procedure: FUSION POSTERIOR SPINAL MULTILEVEL L4- S1;  Surgeon: Mort Ards, MD;  [facility];  Service: Orthopedics;  Laterality: N/A;   TRANSFORAMINAL LUMBAR INTERBODY FUSION (TLIF) WITH PEDICLE SCREW FIXATION 1 LEVEL N/A 09/17/2015   Procedure: TRANSFORAMINAL LUMBAR INTERBODY FUSION (TLIF) WITH PEDICLE SCREW FIXATION 1 LEVEL , Lumbar 4-5;  Surgeon: Mort Ards, MD;  [facility];  Service: Orthopedics;  Laterality: N/A;   Social History:  reports that she has never smoked. She has never used smokeless tobacco. She reports that she does not drink alcohol  and does not use drugs. Family History:  Family History  Problem Relation Age of Onset   Heart disease Mother    Cancer Sister        ovarian     HOME MEDICATIONS: Allergies as of 05/10/2023       Reactions   Ketorolac  Tromethamine  Swelling   10/02/15 - given Toradol  + morphine  and RN noted tongue swelling - given Epi + Benadryl  Has taken ibuprofen in past w/o problem Other reaction(s): Unknown   Morphine  Swelling   Face and tongue swelling   Morphine  Sulfate    Other reaction(s): Unknown   Codeine Other (See Comments)   headache Other reaction(s): Unknown        Medication List        Accurate as of May 10, 2023  2:58 PM. If you have any questions, ask your nurse or doctor.          allopurinol  300 MG tablet Commonly known as: ZYLOPRIM  Take 300 mg by mouth daily.   allopurinol  100 MG tablet Commonly known as: ZYLOPRIM  Take 100 mg by mouth daily.   amLODipine  5 MG tablet Commonly known as: NORVASC  Take 5 mg by mouth daily.    amLODipine  10 MG tablet Commonly known as: NORVASC  Take 10 mg by mouth daily.   aspirin  EC 81 MG tablet Take 81 mg by mouth daily.   atorvastatin  10 MG tablet Commonly known as: LIPITOR Take 1 tablet by mouth once daily   CareSens N Glucose Test test strip Generic drug: glucose blood 1 each by Other route daily in the afternoon. Use as instructed   colchicine  0.6 MG tablet Take 0.6 mg by mouth daily.   dicyclomine 10 MG capsule Commonly known as: BENTYL SMARTSIG:1 Capsule(s) By Mouth PRN   furosemide  20 MG tablet Commonly known as: LASIX  Take 20 mg by mouth daily as needed.  gabapentin  300 MG capsule Commonly known as: NEURONTIN  Take 600 mg by mouth 3 (three) times daily.   glipiZIDE  5 MG tablet Commonly known as: GLUCOTROL  Take 1 tablet (5 mg total) by mouth daily before breakfast.   losartan  25 MG tablet Commonly known as: COZAAR  Take 25 mg by mouth at bedtime.   metFORMIN  1000 MG tablet Commonly known as: GLUCOPHAGE  Take 1 tablet (1,000 mg total) by mouth daily with breakfast.   metoprolol  tartrate 50 MG tablet Commonly known as: LOPRESSOR  Take 0.5 tablets (25 mg total) by mouth 2 (two) times daily.   Multiple Vitamins tablet Take 1 tablet by mouth daily.   omeprazole 20 MG capsule Commonly known as: PRILOSEC Take 20 mg by mouth daily as needed (acid reflux).   ondansetron  4 MG disintegrating tablet Commonly known as: ZOFRAN -ODT Take 1 tablet (4 mg total) by mouth every 8 (eight) hours as needed for nausea or vomiting.   OneTouch Verio IQ System w/Device Kit 1 kit by Does not apply route 2 (two) times daily. E11.65   phenazopyridine  200 MG tablet Commonly known as: PYRIDIUM  Take 1 tablet (200 mg total) by mouth 3 (three) times daily.   polyethylene glycol 17 g packet Commonly known as: MIRALAX  / GLYCOLAX  Take 17 g by mouth 2 (two) times daily.   Rybelsus  7 MG Tabs Generic drug: Semaglutide  Take 1 tablet (7 mg total) by mouth daily.    senna-docusate 8.6-50 MG tablet Commonly known as: Senokot-S Take 1 tablet by mouth 2 (two) times daily.         OBJECTIVE:   Vital Signs: BP 130/80 (BP Location: Left Arm, Patient Position: Sitting, Cuff Size: Normal)   Pulse 91   SpO2 97%   Wt Readings from Last 3 Encounters:  02/09/23 172 lb (78 kg)  08/13/22 176 lb (79.8 kg)  08/05/22 178 lb 9.2 oz (81 kg)     Exam: General: Pt appears well and is in NAD  Lungs: Clear with good BS bilat   Heart: RRR   Extremities: no  pretibial edema.   Neuro: MS is good with appropriate affect, pt is alert and Ox3    DM foot exam: per podiatry 05/09/2023   DATA REVIEWED:   Latest Reference Range & Units 10/30/22 12:06  Sodium 135 - 145 mmol/L 135  Potassium 3.5 - 5.1 mmol/L 3.1 (L)  Chloride 98 - 111 mmol/L 98  CO2 22 - 32 mmol/L 28  Glucose 70 - 99 mg/dL 098 (H)  BUN 8 - 23 mg/dL 13  Creatinine 1.19 - 1.47 mg/dL 8.29  Calcium  8.9 - 10.3 mg/dL 9.9  Anion gap 5 - 15  9  Alkaline Phosphatase 38 - 126 U/L 98  Albumin  3.5 - 5.0 g/dL 4.1  Lipase 11 - 51 U/L 24  AST 15 - 41 U/L 41  ALT 0 - 44 U/L 35  Total Protein 6.5 - 8.1 g/dL 7.4  Total Bilirubin 0.3 - 1.2 mg/dL 1.7 (H)  GFR, Estimated >60 mL/min >60  In office BG 230 mg/DL  Labs done at PCP office, these are not available at this time   ASSESSMENT / PLAN / RECOMMENDATIONS:   1) Type 2 Diabetes Mellitus, Sub-Optimally  controlled, With Neuropathic and macrovascular complications - Most recent A1c of 7.8  %. Goal A1c < 7.0 %.    -A1c has increased, the patient is not sure if she is still on glipizide , as her daughter has been managing her medication and the daughter is not here today  to verify that information - We had reduced her metformin  by 50% in the past due to GFR less than 45 -We discontinued Farxiga  due to recurrent UTI and sepsis - She would like to switch Rybelsus  to once weekly injection as below - She was provided with #1 Ozempic pen  sample   MEDICATIONS: - Stop Rybelsus  14 mg daily  -Continue metformin  1000 mg ONCE daily  -Continue glipizide  5 mg daily - Start Ozempic 0.5 mg weekly   EDUCATION / INSTRUCTIONS: BG monitoring instructions: Patient is instructed to check her blood sugars 1 times a day, fasting  Call [facility] if: BG persistently < 70  I reviewed the Rule of 15 for the treatment of hypoglycemia in detail with the patient. Literature supplied.   2) Diabetic complications:  Eye: Does not have known diabetic retinopathy.  Neuro/ Feet: Does  have known diabetic peripheral neuropathy. Renal: Patient does  have known baseline CKD. She is on an ACEI/ARB at present. Normal Ma/Cr ratio       F/U in 4 months   Signed electronically by: Natale Bail, MD  [facility] Endocrinology  [facility] [address] [place] [place], [place] [address] Phone: [phone] FAX: [phone]   CC:  Nancy Snare, NP [address] [place] [address] Phone: [phone]  Fax: [phone]  Return to Endocrinology clinic as below: Future Appointments  Date Time Provider Department Center  08/08/2023  1:45 PM Charity Conch, DPM [facility]

## 2023-05-10 NOTE — Patient Instructions (Addendum)
-   Stop Rybelsus   - Take Glipizide  5 mg, 1 tablet daily  - Continue Metformin  1000 mg one tablet day  - Start Ozempic 0.5 mg weekly    - HOW TO TREAT LOW BLOOD SUGARS (Blood sugar LESS THAN 70 MG/DL) Please follow the RULE OF 15 for the treatment of hypoglycemia treatment (when your (blood sugars are less than 70 mg/dL)   STEP 1: Take 15 grams of carbohydrates when your blood sugar is low, which includes:  3-4 GLUCOSE TABS  OR 3-4 OZ OF JUICE OR REGULAR SODA OR ONE TUBE OF GLUCOSE GEL    STEP 2: RECHECK blood sugar in 15 MINUTES STEP 3: If your blood sugar is still low at the 15 minute recheck --> then, go back to STEP 1 and treat AGAIN with another 15 grams of carbohydrates.

## 2023-06-09 ENCOUNTER — Ambulatory Visit: Attending: Cardiology | Admitting: Cardiology

## 2023-06-09 VITALS — BP 108/55 | HR 115 | Ht 62.0 in | Wt 169.0 lb

## 2023-06-09 DIAGNOSIS — R079 Chest pain, unspecified: Secondary | ICD-10-CM

## 2023-06-09 DIAGNOSIS — R0600 Dyspnea, unspecified: Secondary | ICD-10-CM

## 2023-06-09 DIAGNOSIS — I272 Pulmonary hypertension, unspecified: Secondary | ICD-10-CM | POA: Diagnosis not present

## 2023-06-09 DIAGNOSIS — I493 Ventricular premature depolarization: Secondary | ICD-10-CM

## 2023-06-09 DIAGNOSIS — Z79899 Other long term (current) drug therapy: Secondary | ICD-10-CM

## 2023-06-09 DIAGNOSIS — I4891 Unspecified atrial fibrillation: Secondary | ICD-10-CM

## 2023-06-09 DIAGNOSIS — I2510 Atherosclerotic heart disease of native coronary artery without angina pectoris: Secondary | ICD-10-CM

## 2023-06-09 DIAGNOSIS — I119 Hypertensive heart disease without heart failure: Secondary | ICD-10-CM | POA: Diagnosis not present

## 2023-06-09 DIAGNOSIS — G4733 Obstructive sleep apnea (adult) (pediatric): Secondary | ICD-10-CM | POA: Diagnosis not present

## 2023-06-09 DIAGNOSIS — E785 Hyperlipidemia, unspecified: Secondary | ICD-10-CM

## 2023-06-09 LAB — LIPID PANEL

## 2023-06-09 MED ORDER — APIXABAN 5 MG PO TABS: 5.0000 mg | ORAL_TABLET | Freq: Two times a day (BID) | ORAL | 3 refills | Status: AC

## 2023-06-09 MED ORDER — METOPROLOL TARTRATE 50 MG PO TABS: 50.0000 mg | ORAL_TABLET | Freq: Two times a day (BID) | ORAL | 3 refills | Status: AC

## 2023-06-09 NOTE — Progress Notes (Unsigned)
 Cardiology Office Note:    Date:  06/09/2023   ID:  Nancy [name], DOB February 22, 1940, MRN [id number]  PCP:  Maryellen Snare, NP  Cardiologist:  Gaylyn Keas, MD    Referring MD: Maryellen Snare, NP   Chief Complaint  Patient presents with   Hypertension   Coronary Artery Disease   Hyperlipidemia   Sleep Apnea    History of Present Illness:    Nancy [name] is a 83 y.o. female with a hx of HTN and diastolic dysfunction .  She also has a history of mild pulmonary hypertension by 2D echocardiogram in July 2013.  She had a Lexiscan  Myoview  for chest pain 10/13/2016 which showed no inducible ischemia. 2D echo in 2019 showed  normal LVF with EF 60-65% with moderate LVH and elevated filling pressures. She also has a hx of OSA and is on PAP therapy.   She also has a history of atypical CP and DOE and coronary CTA was done which showed minimal CAD of the LM, mid and distal LAD and her CP was felt to be non cardiac in origin. 2D echo was normal with severe ASSH, no PHTN and cardiac MRI was done showing normal LVF with EF 73%, moderate BSH, normal RV and no evidence of cardiac amyloid or HOCM and findings c/w hypertensive heart disease.   SHe is here today for followup and is doing well.  She tells me that this past week she has noticed some discomfort in her chest when up doing housework and has noticed that she gets fatigued now when doing laundry. She has chronic DOE but feels it has become worse. She has mild pedal edema from time to time.   She has been on Ozempic  for DM and has had some dizziness on it. She denies any PND, orthopnea,  palpitations or syncope. She is compliant with her meds and is tolerating meds with no SE.    She has not been using her CPAP device.   Past Medical History:  Diagnosis Date   Allergy    Anemia    Aortic atherosclerosis (HCC)    Atrial tachycardia (HCC)    Breast cyst    left breast   CAD (coronary artery disease), native coronary artery    minimal  ASCAD to the distal LM and mid LAD with 0-24% stenosis and Calcium  score of 26.   Chest pain    CKD (chronic kidney disease), stage III (HCC)    Complication of anesthesia 1986   slow to awaken after cholecystectomy   Diastolic dysfunction    with mild pulmonary HTN by echo 07/2011   Gastric ulcer    GERD (gastroesophageal reflux disease)    Gout    Hyperlipidemia    Hypertension    Hypertensive heart disease    Neuropathy    from diabetes per patient   Osteoarthritis    Osteopenia    Shortness of breath dyspnea    with activity   Sinus pause    Spine deformity    lower spine   Type II diabetes mellitus (HCC)     Past Surgical History:  Procedure Laterality Date   ABDOMINAL ADHESION SURGERY     ABDOMINAL HYSTERECTOMY     ANUS SURGERY     for torn tissue   APPENDECTOMY     BREAST BIOPSY     BREAST LUMPECTOMY     left   CHOLECYSTECTOMY  1986   COLONOSCOPY     ESOPHAGOGASTRODUODENOSCOPY (EGD) WITH PROPOFOL  N/A  07/18/2013   Procedure: ESOPHAGOGASTRODUODENOSCOPY (EGD) WITH PROPOFOL ;  Surgeon: Evangeline Hilts, MD;  Location: [facility];  Service: Endoscopy;  Laterality: N/A;   EUS N/A 07/18/2013   Procedure: ESOPHAGEAL ENDOSCOPIC ULTRASOUND (EUS) RADIAL;  Surgeon: Evangeline Hilts, MD;  Location: [facility];  Service: Endoscopy;  Laterality: N/A;   IR GENERIC HISTORICAL  10/30/2015   IR US  GUIDE VASC ACCESS RIGHT 10/30/2015 Marlin Simmonds, PA-C [facility]   IR GENERIC HISTORICAL  10/30/2015   IR FLUORO GUIDE CV LINE RIGHT 10/30/2015 Marlin Simmonds, PA-C [facility]   LUMBAR WOUND DEBRIDEMENT N/A 10/01/2015   Procedure: LUMBAR WOUND DEBRIDEMENT;  Surgeon: Mort Ards, MD;  Location: [facility];  Service: Orthopedics;  Laterality: N/A;   LUMBAR WOUND DEBRIDEMENT N/A 10/15/2015   Procedure: [facility] OUT AND CLOSURE OF BACK WOUND;  Surgeon: Mort Ards, MD;  Location: [facility];  Service: Orthopedics;  Laterality: N/A;   MASS EXCISION Left 07/24/2015   Procedure: EXCISION MASS LEFT LONG  FINGER;  Surgeon: Brunilda Capra, MD;  Location: [facility];  Service: Orthopedics;  Laterality: Left;   SPINAL FUSION N/A 09/17/2015   Procedure: FUSION POSTERIOR SPINAL MULTILEVEL L4- S1;  Surgeon: Mort Ards, MD;  Location: [facility];  Service: Orthopedics;  Laterality: N/A;   TRANSFORAMINAL LUMBAR INTERBODY FUSION (TLIF) WITH PEDICLE SCREW FIXATION 1 LEVEL N/A 09/17/2015   Procedure: TRANSFORAMINAL LUMBAR INTERBODY FUSION (TLIF) WITH PEDICLE SCREW FIXATION 1 LEVEL , Lumbar 4-5;  Surgeon: Mort Ards, MD;  Location: [facility];  Service: Orthopedics;  Laterality: N/A;    Current Medications: Current Meds  Medication Sig   allopurinol  (ZYLOPRIM ) 100 MG tablet Take 100 mg by mouth daily.   allopurinol  (ZYLOPRIM ) 300 MG tablet Take 300 mg by mouth daily.    amLODipine  (NORVASC ) 10 MG tablet Take 10 mg by mouth daily.   amLODipine  (NORVASC ) 5 MG tablet Take 5 mg by mouth daily.   aspirin  EC 81 MG tablet Take 81 mg by mouth daily.   atorvastatin  (LIPITOR) 10 MG tablet Take 1 tablet by mouth once daily   Blood Glucose Monitoring Suppl (ONETOUCH VERIO IQ SYSTEM) w/Device KIT 1 kit by Does not apply route 2 (two) times daily. E11.65   colchicine  0.6 MG tablet Take 0.6 mg by mouth daily.   dicyclomine (BENTYL) 10 MG capsule SMARTSIG:1 Capsule(s) By Mouth PRN   furosemide  (LASIX ) 20 MG tablet Take 20 mg by mouth daily as needed.   gabapentin  (NEURONTIN ) 300 MG capsule Take 600 mg by mouth 3 (three) times daily.   glipiZIDE  (GLUCOTROL ) 5 MG tablet Take 1 tablet (5 mg total) by mouth daily before breakfast.   glucose blood (CARESENS N GLUCOSE TEST) test strip 1 each by Other route daily in the afternoon. Use as instructed   losartan  (COZAAR ) 25 MG tablet Take 25 mg by mouth at bedtime.   metFORMIN  (GLUCOPHAGE ) 1000 MG tablet Take 1 tablet (1,000 mg total) by mouth daily with breakfast.   Multiple Vitamins tablet Take 1 tablet by mouth daily.   omeprazole (PRILOSEC) 20 MG capsule Take 20 mg by  mouth daily as needed (acid reflux).   ondansetron  (ZOFRAN -ODT) 4 MG disintegrating tablet Take 1 tablet (4 mg total) by mouth every 8 (eight) hours as needed for nausea or vomiting.   phenazopyridine  (PYRIDIUM ) 200 MG tablet Take 1 tablet (200 mg total) by mouth 3 (three) times daily.   polyethylene glycol (MIRALAX  / GLYCOLAX ) 17 g packet Take 17 g by mouth 2 (two) times daily.   Semaglutide ,0.25 or  0.5MG /DOS, 2 MG/3ML SOPN Inject 0.5 mg into the skin once a week.   senna-docusate (SENOKOT-S) 8.6-50 MG tablet Take 1 tablet by mouth 2 (two) times daily.     Allergies:   Ketorolac  tromethamine , Morphine , Morphine  sulfate, and Codeine   Social History   Socioeconomic History   Marital status: Divorced    Spouse name: Not on file   Number of children: 4   Years of education: 12   Highest education level: Not on file  Occupational History   Not on file  Tobacco Use   Smoking status: Never   Smokeless tobacco: Never  Vaping Use   Vaping status: Never Used  Substance and Sexual Activity   Alcohol  use: No    Comment: occasional wine   Drug use: No   Sexual activity: Not Currently  Other Topics Concern   Not on file  Social History Narrative   Patient is single, has 3 children living 1 deceased   Patient is right handed   Education level is 12   Caffeine consumption is 0   Social Drivers of Corporate investment banker Strain: Not on file  Food Insecurity: Low Risk  (02/22/2023)   Received from [facility]   Hunger Vital Sign    Worried About Running Out of Food in the Last Year: Never true    Ran Out of Food in the Last Year: Never true  Transportation Needs: No Transportation Needs (02/22/2023)   Received from [facility]    In the past 12 months, has lack of reliable transportation kept you from medical appointments, meetings, work or from getting things needed for daily living? : No  Physical Activity: Not on file  Stress: Not on file  Social  Connections: Not on file     Family History: The patient's family history includes Cancer in her sister; Heart disease in her mother.  ROS:   Please see the history of present illness.    ROS  All other systems reviewed and negative.   EKGs/Labs/Other Studies Reviewed:    The following studies were reviewed today: PAP compliance download   Recent Labs: 07/22/2022: B Natriuretic Peptide 104.2; Magnesium  2.0 10/30/2022: ALT 35; BUN 13; Creatinine, Ser 0.92; Hemoglobin 11.8; Platelets 181; Potassium 3.1; Sodium 135   Recent Lipid Panel    Component Value Date/Time   CHOL 101 06/17/2022 1437   TRIG 111 06/17/2022 1437   HDL 34 (L) 06/17/2022 1437   CHOLHDL 3.0 06/17/2022 1437   CHOLHDL 4 01/28/2021 1036   VLDL 20.8 01/28/2021 1036   LDLCALC 46 06/17/2022 1437    Physical Exam:    VS:  BP (!) 108/55 (BP Location: Left Arm)   Pulse (!) 115   Ht 5\' 2"  (1.575 m)   Wt 169 lb (76.7 kg)   SpO2 94%   BMI 30.91 kg/m     Wt Readings from Last 3 Encounters:  06/09/23 169 lb (76.7 kg)  02/09/23 172 lb (78 kg)  08/13/22 176 lb (79.8 kg)    GEN: Well nourished, well developed in no acute distress HEENT: Normal NECK: No JVD; No carotid bruits LYMPHATICS: No lymphadenopathy CARDIAC: Irregularly, no murmurs, rubs, gallops RESPIRATORY:  Clear to auscultation without rales, wheezing or rhonchi  ABDOMEN: Soft, non-tender, non-distended MUSCULOSKELETAL:  No edema; No deformity  SKIN: Warm and dry NEUROLOGIC:  Alert and oriented x 3 PSYCHIATRIC:  Normal affect  ASSESSMENT:    1. Hypertensive heart disease without heart failure  2. Pulmonary hypertension, unspecified (HCC)   3. CAD in native artery   4. OSA (obstructive sleep apnea)   5. PVC's (premature ventricular contractions)   6. Hyperlipidemia LDL goal <70     PLAN:    In order of problems listed above:  1.  HTN -BP controlled on exam today -Continue amlodipine  5 mg daily, losartan  25 mg daily -increasing  Lopressor  to 50mg  BID for HR control in new afib -I have personally reviewed and interpreted outside labs performed by patient's PCP which showed serum creatinine 0.92 and potassium 3.1 on 10/30/2022 -Repeat BMP to follow-up on potassium  2.  Pulmonary HTN -echo 2019 with no pulmonary HTN  -PASP and no PHTN on echo 11/2019 -2D echo 03/31/2022 showed EF 60 to 65% with grade 1 diastolic dysfunction and normal PA pressures  3. ASCAD/Chest pain -nuclear stress test 2018 and echo 2019 were normal -coronary CTA showed minimal CAD of the LM, mid and distal LAD -she has been having exertional angina this week along with increased DOE>>I am going to get a Stress PET CT to rule out ischemia and assess for microvascular disease -cardiac MRI due to severe BSH on echo showed no HOCM and c/w hypertensive heart disease -Continue aspirin  81 mg daily, BB and atorvastatin  10 mg daily with as needed refills  4. OSA  -she has not been using her CPAP because she does not sleep well -she will try to use her CPAP more   5.  PVCs  - No recent issues with palpitations - Continue Lopressor    6.  HLD -LDL goal < 70 -Check FLP and ALT -Continue atorvastatin  10 mg daily with as needed refills  7.  New onset atrial fibrillation with RVR - Unclear duration but possibly recent given her 1 week history of exertional fatigue and worsening shortness of breath - CHA2DS2-VASc score is 6 - Will start Eliquis 5 mg twice daily - repeat 2D echo to assess left atrial size and LV function - Will get into A-fib clinic - check TSH - increase Lopressor  to 50mg  BID for better HR control   Follow-up 4 weeks with PA and 6 months with me   Medication Adjustments/Labs and Tests Ordered: Current medicines are reviewed at length with the patient today.  Concerns regarding medicines are outlined above.  Orders Placed This Encounter  Procedures   EKG 12-Lead   No orders of the defined types were placed in this  encounter.   Signed, Gaylyn Keas, MD  06/09/2023 9:13 AM    [facility]

## 2023-06-09 NOTE — Patient Instructions (Addendum)
 Medication Instructions:  Please INCREASE your dose of lopressor  to 50 mg twice a day.   Please START taking eliquis 5 mg twice a day.  *If you need a refill on your cardiac medications before your next appointment, please call your pharmacy*  Lab Work: Please complete a TSH, BMET, ALT and a FASTING lipid panel in our first floor lab before you leave today.   If you have labs (blood work) drawn today and your tests are completely normal, you will receive your results only by: MyChart Message (if you have MyChart) OR A paper copy in the mail If you have any lab test that is abnormal or we need to change your treatment, we will call you to review the results.  Testing/Procedures: Your physician has requested that you have an echocardiogram. Echocardiography is a painless test that uses sound waves to create images of your heart. It provides your doctor with information about the size and shape of your heart and how well your heart's chambers and valves are working. This procedure takes approximately one hour. There are no restrictions for this procedure. Please do NOT wear cologne, perfume, aftershave, or lotions (deodorant is allowed). Please arrive 15 minutes prior to your appointment time.  Please note: We ask at that you not bring children with you during ultrasound (echo/ vascular) testing. Due to room size and safety concerns, children are not allowed in the ultrasound rooms during exams. Our front office staff cannot provide observation of children in our lobby area while testing is being conducted. An adult accompanying a patient to their appointment will only be allowed in the ultrasound room at the discretion of the ultrasound technician under special circumstances. We apologize for any inconvenience.     Please report to Radiology at the [facility] Main Entrance 30 minutes early for your test.  [address] [place], [place] [address]  How to Prepare for Your Cardiac  PET/CT Stress Test:  Nothing to eat or drink, except water, 3 hours prior to arrival time.  NO caffeine/decaffeinated products, or chocolate 12 hours prior to arrival. (Please note decaffeinated beverages (teas/coffees) still contain caffeine).  If you have caffeine within 12 hours prior, the test will need to be rescheduled.  Medication instructions: Do not take erectile dysfunction medications for 72 hours prior to test (sildenafil, tadalafil) Do not take nitrates (isosorbide mononitrate, Ranexa) the day before or day of test Do not take tamsulosin the day before or morning of test Hold theophylline containing medications for 12 hours. Hold Dipyridamole 48 hours prior to the test.  Diabetic Preparation: If able to eat breakfast prior to 3 hour fasting, you may take all medications, including your insulin . Do not worry if you miss your breakfast dose of insulin  - start at your next meal. If you do not eat prior to 3 hour fast-Hold all diabetes (oral and insulin ) medications. Patients who wear a continuous glucose monitor MUST remove the device prior to scanning.  You may take your remaining medications with water.  NO perfume, cologne or lotion on chest or abdomen area. FEMALES - Please avoid wearing dresses to this appointment.  Total time is 1 to 2 hours; you may want to bring reading material for the waiting time.  IF YOU THINK YOU MAY BE PREGNANT, OR ARE NURSING PLEASE INFORM THE TECHNOLOGIST.  In preparation for your appointment, medication and supplies will be purchased.  Appointment availability is limited, so if you need to cancel or reschedule, please call  the Radiology Department Scheduler at [phone] 24 hours in advance to avoid a cancellation fee of $100.00  What to Expect When you Arrive:  Once you arrive and check in for your appointment, you will be taken to a preparation room within the Radiology Department.  A technologist or Nurse will obtain your medical history,  verify that you are correctly prepped for the exam, and explain the procedure.  Afterwards, an IV will be started in your arm and electrodes will be placed on your skin for EKG monitoring during the stress portion of the exam. Then you will be escorted to the PET/CT scanner.  There, staff will get you positioned on the scanner and obtain a blood pressure and EKG.  During the exam, you will continue to be connected to the EKG and blood pressure machines.  A small, safe amount of a radioactive tracer will be injected in your IV to obtain a series of pictures of your heart along with an injection of a stress agent.    After your Exam:  It is recommended that you eat a meal and drink a caffeinated beverage to counter act any effects of the stress agent.  Drink plenty of fluids for the remainder of the day and urinate frequently for the first couple of hours after the exam.  Your doctor will inform you of your test results within 7-10 business days.  For more information and frequently asked questions, please visit our website: [email or web address]  For questions about your test or how to prepare for your test, please call: Cardiac Imaging Nurse Navigators Office: [phone]   Follow-Up: At [facility], you and your health needs are our priority.  As part of our continuing mission to provide you with exceptional heart care, our providers are all part of one team.  This team includes your primary Cardiologist (physician) and Advanced Practice Providers or APPs (Physician Assistants and Nurse Practitioners) who all work together to provide you with the care you need, when you need it.  Your next appointment:   4 weeks with APP Marlyse Single, [place], Charles Connor, NP, Marlana Silvan, J. Cleaver.)  Then see Provider:   Gaylyn Keas, MD  in 6 months.  We recommend signing up for the patient portal called "MyChart".  Sign up information is provided on this After Visit Summary.  MyChart  is used to connect with patients for Virtual Visits (Telemedicine).  Patients are able to view lab/test results, encounter notes, upcoming appointments, etc.  Non-urgent messages can be sent to your provider as well.   To learn more about what you can do with MyChart, go to [email or web address].   Other Instructions Dr. Micael Adas has referred you to our atrial fibrillation clinic. In this clinic, they help to optimize your medications and minimize symptoms.

## 2023-06-10 ENCOUNTER — Ambulatory Visit: Payer: Self-pay | Admitting: Cardiology

## 2023-06-10 DIAGNOSIS — I10 Essential (primary) hypertension: Secondary | ICD-10-CM

## 2023-06-10 DIAGNOSIS — I272 Pulmonary hypertension, unspecified: Secondary | ICD-10-CM

## 2023-06-10 DIAGNOSIS — Z79899 Other long term (current) drug therapy: Secondary | ICD-10-CM

## 2023-06-10 DIAGNOSIS — I5032 Chronic diastolic (congestive) heart failure: Secondary | ICD-10-CM

## 2023-06-10 LAB — ALT: ALT: 38 IU/L — ABNORMAL HIGH (ref 0–32)

## 2023-06-10 LAB — BASIC METABOLIC PANEL WITH GFR
BUN/Creatinine Ratio: 13 (ref 12–28)
BUN: 17 mg/dL (ref 8–27)
CO2: 18 mmol/L — ABNORMAL LOW (ref 20–29)
Calcium: 9.8 mg/dL (ref 8.7–10.3)
Chloride: 98 mmol/L (ref 96–106)
Creatinine, Ser: 1.27 mg/dL — ABNORMAL HIGH (ref 0.57–1.00)
Glucose: 136 mg/dL — ABNORMAL HIGH (ref 70–99)
Potassium: 3.8 mmol/L (ref 3.5–5.2)
Sodium: 137 mmol/L (ref 134–144)
eGFR: 42 mL/min/{1.73_m2} — ABNORMAL LOW (ref >59)

## 2023-06-10 LAB — LIPID PANEL
Cholesterol, Total: 101 mg/dL (ref 100–199)
HDL: 37 mg/dL — ABNORMAL LOW (ref >39)
LDL CALC COMMENT:: 2.7 ratio (ref 0.0–4.4)
LDL Chol Calc (NIH): 45 mg/dL (ref 0–99)
Triglycerides: 96 mg/dL (ref 0–149)
VLDL Cholesterol Cal: 19 mg/dL (ref 5–40)

## 2023-06-10 LAB — TSH: TSH: 3.82 u[IU]/mL (ref 0.450–4.500)

## 2023-06-16 ENCOUNTER — Telehealth: Payer: Self-pay | Admitting: Cardiology

## 2023-06-16 NOTE — Telephone Encounter (Signed)
 Pt c/o medication issue:  1. Name of Medication:   aspirin  EC 81 MG tablet    2. How are you currently taking this medication (dosage and times per day)?   Take 81 mg by mouth daily.    3. Are you having a reaction (difficulty breathing--STAT)? No  4. What is your medication issue? Pt would like  c/b as to whether she needs to continue taking above medication while on   apixaban  (ELIQUIS ) 5 MG TABS tablet   Please advise

## 2023-06-16 NOTE — Telephone Encounter (Signed)
 From DR Turner: 06/09/23  cardiac MRI due to severe BSH on echo showed no HOCM and c/w hypertensive heart disease -Continue aspirin  81 mg daily, BB and atorvastatin  10 mg daily with as needed refills  Pt advised to continue but she can further discuss at her upcoming Afib clinic appt.

## 2023-06-20 NOTE — Telephone Encounter (Signed)
 Call to patient to discus lasix  usage. Explained that creatinine is elevated and offending medications can cause this. Patient verifies that she has been using every day as opposed to as needed. She says she's been taking lasix  daily "for some time" and didn't realize it was as needed. She states she has chronic SOB on exertion and chronic pedal edema (as notated in visit note 06/09/23). Patient clarifies that she feels there is no change in her SOB or pedal edema since her visit with Dr. Micael Adas. Forwarded to Dr. Micael Adas for recommendations.

## 2023-06-21 ENCOUNTER — Ambulatory Visit (HOSPITAL_COMMUNITY)
Admission: RE | Admit: 2023-06-21 | Discharge: 2023-06-21 | Disposition: A | Discharge status: Home / Self Care | Source: Ambulatory Visit | Attending: Internal Medicine | Admitting: Internal Medicine

## 2023-06-21 ENCOUNTER — Inpatient Hospital Stay (HOSPITAL_COMMUNITY)
Admission: RE | Admit: 2023-06-21 | Discharge: 2023-06-21 | Disposition: A | Discharge status: Home / Self Care | Source: Ambulatory Visit | Attending: Internal Medicine | Admitting: Internal Medicine

## 2023-06-21 ENCOUNTER — Other Ambulatory Visit (HOSPITAL_COMMUNITY): Payer: Self-pay | Admitting: *Deleted

## 2023-06-21 VITALS — BP 120/54 | HR 69 | Ht 62.0 in | Wt 170.4 lb

## 2023-06-21 DIAGNOSIS — D6869 Other thrombophilia: Secondary | ICD-10-CM | POA: Diagnosis not present

## 2023-06-21 DIAGNOSIS — I4891 Unspecified atrial fibrillation: Secondary | ICD-10-CM | POA: Diagnosis not present

## 2023-06-21 DIAGNOSIS — I480 Paroxysmal atrial fibrillation: Secondary | ICD-10-CM

## 2023-06-21 NOTE — Progress Notes (Signed)
 Primary Care Physician: Maryellen Snare, NP Primary Cardiologist: Gaylyn Keas, MD Electrophysiologist: None     Referring Physician: Dr. Jolanda Nation Nancy [name] is a 83 y.o. female with a history of HTN, CAD, HLD, OSA not compliant with CPAP, and atrial fibrillation who presents for consultation in the [facility]. Seen by Dr. Micael Adas on 06/09/23 and noted to be in new Afib with RVR. Patient is on Eliquis  5 mg BID for a CHADS2VASC score of 5.  On evaluation today, she is currently in NSR. She admits to sometimes missing Eliquis  dose as well as other medications. She notes sometimes to forget taking her medications on a fairly regular basis. She does admit that she does not sleep well and has not been using her CPAP device. She notes to historically be tired a lot of the time, but states the SOB she experienced is now better.  Today, she denies symptoms of palpitations, chest pain, orthopnea, PND, lower extremity edema, dizziness, presyncope, syncope, snoring, daytime somnolence, bleeding, or neurologic sequela. The patient is tolerating medications without difficulties and is otherwise without complaint today.    Atrial Fibrillation Risk Factors:  she does have symptoms or diagnosis of sleep apnea. she is not compliant with CPAP therapy.   she has a BMI of Body mass index is 31.17 kg/m.Aaron Aas Filed Weights   06/21/23 1033  Weight: 77.3 kg    Current Outpatient Medications  Medication Sig Dispense Refill   allopurinol  (ZYLOPRIM ) 100 MG tablet Take 100 mg by mouth daily.     amLODipine  (NORVASC ) 10 MG tablet Take 10 mg by mouth daily.     apixaban  (ELIQUIS ) 5 MG TABS tablet Take 1 tablet (5 mg total) by mouth 2 (two) times daily. 180 tablet 3   aspirin  EC 81 MG tablet Take 81 mg by mouth daily.     atorvastatin  (LIPITOR) 10 MG tablet Take 1 tablet by mouth once daily 90 tablet 3   Blood Glucose Monitoring Suppl (ONETOUCH VERIO IQ SYSTEM) w/Device KIT  1 kit by Does not apply route 2 (two) times daily. E11.65 1 kit 0   colchicine  0.6 MG tablet Take 0.6 mg by mouth daily.     dicyclomine (BENTYL) 10 MG capsule SMARTSIG:1 Capsule(s) By Mouth PRN     furosemide  (LASIX ) 20 MG tablet Take 20 mg by mouth daily as needed.     gabapentin  (NEURONTIN ) 300 MG capsule Take 600 mg by mouth 3 (three) times daily.     glipiZIDE  (GLUCOTROL ) 5 MG tablet Take 1 tablet (5 mg total) by mouth daily before breakfast. 90 tablet 3   glucose blood (CARESENS N GLUCOSE TEST) test strip 1 each by Other route daily in the afternoon. Use as instructed 100 each 3   losartan  (COZAAR ) 25 MG tablet Take 25 mg by mouth at bedtime.     metFORMIN  (GLUCOPHAGE ) 1000 MG tablet Take 1 tablet (1,000 mg total) by mouth daily with breakfast. 90 tablet 3   metoprolol  tartrate (LOPRESSOR ) 50 MG tablet Take 1 tablet (50 mg total) by mouth 2 (two) times daily. 180 tablet 3   Multiple Vitamins tablet Take 1 tablet by mouth daily.     omeprazole (PRILOSEC) 20 MG capsule Take 20 mg by mouth daily as needed (acid reflux).     ondansetron  (ZOFRAN -ODT) 4 MG disintegrating tablet Take 1 tablet (4 mg total) by mouth every 8 (eight) hours as needed for nausea or vomiting. 20 tablet 0  phenazopyridine  (PYRIDIUM ) 200 MG tablet Take 1 tablet (200 mg total) by mouth 3 (three) times daily. 6 tablet 0   Polyethyl Glycol-Propyl Glycol (SYSTANE) 0.4-0.3 % SOLN 1 drop into affected eye as needed Ophthalmic QD     polyethylene glycol (MIRALAX  / GLYCOLAX ) 17 g packet Take 17 g by mouth 2 (two) times daily. 14 each 0   Semaglutide ,0.25 or 0.5MG /DOS, 2 MG/3ML SOPN Inject 0.5 mg into the skin once a week. 9 mL 3   senna-docusate (SENOKOT-S) 8.6-50 MG tablet Take 1 tablet by mouth 2 (two) times daily. 30 tablet 0   No current facility-administered medications for this encounter.    Atrial Fibrillation Management history:  Previous antiarrhythmic drugs: none Previous cardioversions: none Previous ablations:  none Anticoagulation history: Eliquis    ROS- All systems are reviewed and negative except as per the HPI above.  Physical Exam: BP (!) 120/54   Pulse 69   Ht 5\' 2"  (1.575 m)   Wt 77.3 kg   BMI 31.17 kg/m   GEN: Well nourished, well developed in no acute distress NECK: No JVD; No carotid bruits CARDIAC: Regular rate and rhythm, no murmurs, rubs, gallops RESPIRATORY:  Clear to auscultation without rales, wheezing or rhonchi  ABDOMEN: Soft, non-tender, non-distended EXTREMITIES:  No edema; No deformity   EKG today demonstrates  Vent. rate 69 BPM PR interval 182 ms QRS duration 70 ms QT/QTcB 396/424 ms P-R-T axes 47 -10 59 Sinus rhythm with Premature atrial complexes Cannot rule out Anterior infarct , age undetermined Abnormal ECG When compared with ECG of 09-Jun-2023 09:34, Sinus rhythm has replaced Atrial fibrillation  Echo 04/08/22 demonstrated  1. No LVOT obstruction. Left ventricular ejection fraction, by  estimation, is 60 to 65%. The left ventricle has normal function. The left  ventricle has no regional wall motion abnormalities. There is moderate  asymmetric left ventricular hypertrophy of  the basal-septal segment. Left ventricular diastolic parameters are  consistent with Grade I diastolic dysfunction (impaired relaxation).   2. Right ventricular systolic function is normal. The right ventricular  size is normal. There is normal pulmonary artery systolic pressure.   3. No evidence of mitral valve regurgitation.   4. Aortic valve regurgitation is not visualized.   5. The inferior vena cava is normal in size with greater than 50%  respiratory variability, suggesting right atrial pressure of 3 mmHg.    ASSESSMENT & PLAN CHA2DS2-VASc Score = 5  The patient's score is based upon: CHF History: 0 HTN History: 1 Diabetes History: 0 Stroke History: 0 Vascular Disease History: 1 Age Score: 2 Gender Score: 1       ASSESSMENT AND PLAN: Paroxysmal Atrial  Fibrillation (ICD10:  I48.0) The patient's CHA2DS2-VASc score is 5, indicating a 7.2% annual risk of stroke.    She is currently in NSR. Continue Lopressor  50 mg BID. Discussion about triggers for Afib. Encouraged to be compliant with CPAP. Recommended to speak with PCP about sleep issues. Will place 2 week Zio monitor to assess Afib burden. Follow up will depend on monitor results.  Secondary Hypercoagulable State (ICD10:  D68.69) The patient is at significant risk for stroke/thromboembolism based upon her CHA2DS2-VASc Score of 5.  Continue Apixaban  (Eliquis ).  Continue Eliquis  5 mg BID without interruption. Advised patient to set up alarm clock on phone to prevent missed doses. I specifically addressed risks vs benefits of anticoagulation and noted the possible consequence of stroke related to Afib if missing doses of Eliquis .     Follow up will be  based on monitor results.    Minnie Amber, PA-C  Afib Clinic [facility] [address] [place], [place] [address] [phone]

## 2023-06-23 ENCOUNTER — Telehealth: Payer: Self-pay | Admitting: Podiatry

## 2023-06-23 ENCOUNTER — Other Ambulatory Visit: Payer: Self-pay | Admitting: Podiatry

## 2023-06-23 MED ORDER — GABAPENTIN 300 MG PO CAPS: 600.0000 mg | ORAL_CAPSULE | Freq: Three times a day (TID) | ORAL | 0 refills | Status: DC

## 2023-06-23 NOTE — Telephone Encounter (Signed)
 Patient called requesting a refill of gabapentin . Her preferred pharmacy is the [facility] on [address].

## 2023-06-23 NOTE — Progress Notes (Signed)
 error

## 2023-06-24 NOTE — Telephone Encounter (Signed)
 Call to patient to advise that Dr. Micael Adas recommends she continue her current diuretic dose. Asked patient to make sure she keeps her appt on 07/13/23 and to call our office if she notices any new symptoms such as leg swelling, SOB, cramps while on daily lasix .

## 2023-06-28 ENCOUNTER — Encounter (HOSPITAL_COMMUNITY): Payer: Self-pay | Admitting: Cardiology

## 2023-07-05 ENCOUNTER — Other Ambulatory Visit: Payer: Self-pay | Admitting: Cardiology

## 2023-07-12 NOTE — Progress Notes (Unsigned)
 Cardiology Office Note    Date:  07/13/2023  ID:  Nancy [name], DOB 02/17/1940, MRN [id number] PCP:  Campbell Reynolds, NP  Cardiologist:  Wilbert Bihari, MD  Electrophysiologist:  None   Chief Complaint: f/u atrial fibrillation  History of Present Illness: Nancy    Nancy [name] is a 83 y.o. female with visit-pertinent history of hypertension, hypertensive heart disease, mild coronary atherosclerosis by cor CT, brief atrial tachycardia and sinus pauses, PVCs, PAF, sleep apnea on CPAP followed by Dr. Bihari, gastric ulcer, GERD, hyperlipidemia, neuropathy from diabetes, osteoarthritis, previous mild pulmonary HTN (resolved), spine deformity, chronic appearing anemia, chronic kidney disease stage 3a,who is seen for follow-up.   She has followed with cardiology for chest pain, SOB, palpitations, dizziness, and fatigue. Remote monitor 2019 showed HR 50-164bpm, occasional PACs/PVCs. Repeat monitor 07/2019 showed sinus bradycardia, sinus rhythm, and sinus tachycardia with average heart rate 71 bpm, range 58-103, and 1 episode nonsustained atrial tachycardia. There were 2 episodes of sinus pauses up to 3.5 seconds (once during sleep at 30 bpm and the other occurred 8:37pm at 30 bpm). Palpitations correlated with NSR. As she was having some dizziness around follow-up of that monitor, metoprolol  dose was reduced. Amlodipine  was also reduced. 2D echo 11/2019 demonstrated severe asymmetric basal septal hypertrophy so cMRI was pursued 12/2019 showing no evidence of infiltrative heart disease, findings felt c/w hypertensive heart disease. Of note, pulmonary HTN was previously reported on remote echo but not the case in more recent testing. She was admitted 04/2021 with syncope on the commode felt vasovagal. She also was found to have hypotension, UTI, AKI requiring hydration, antibiotics, and discontinuation of loartan. Echo felt consistent with prior. She underwent repeat outpatient coronary CTA showing mild  nonobstructive CAD. Overread showed small hiatal hernia, aortic atherosclerosis and possible bronchomalacia for which Dr. Bihari recommended f/u PCP. F/u echo 03/2022 showed EF 60-65%, moderate asymmetric LVH BSS without LVOT gradient, normal PASP. Over the last few years it appears amlodipine  was eventually increased back to 10mg  daily and losartan  added back.  She was seen for routine f/u by Dr. Bihari 06/09/23 and noted to be in new onset atrial fibrillation HR 1teens. She was also having increased chest pain/SOB so echo and cardiac PET were ordered which are still pending. Metoprolol  was increased and she was started on Eliquis . She saw afib clinic 06/21/23 at which time she was back in NSR, therefore monitor was ordered, follow-up TBD based on results. In retrospect the patient also reports that on Mother's Day (5/11, prior to that OV), she had an had an episode of weakness and vomiting after taking all of her medicines on an empty stomach. She felt much better after she had thrown up. No recurrent events since then. She is here back today with daughter Nancy [name]. She continues to notice vague sense of dyspnea with exertion. She is very sedentary in general so does not try to push herself much. There is no acute change in symptoms from 05/2023. There is possibly a component of chest discomfort with activity though difficult to describe, not exactly pain. Sometimes she will get different sharp stabbing fleeting pains. Sometimes she has good days and no symptoms at all. She also notes continued chronic fatigue/generalized weakness as in prior visits. No edema, orthopnea, palpitations or cardiac awareness.   Labwork independently reviewed: 05/2023 TSH ok, ALT 38 c/w prior, LDL 45, trig 96, K 3.8, CR 1.27 10/2022 Hgb 11.8, plt 181  ROS: .    Please see the  history of present illness.  All other systems are reviewed and otherwise negative.  Studies Reviewed: Nancy    EKG:  EKG is not ordered today  CV Studies:  Cardiac studies reviewed are outlined and summarized above. Otherwise please see EMR for full report.   Current Reported Medications:.    Current Meds  Medication Sig   allopurinol  (ZYLOPRIM ) 100 MG tablet Take 100 mg by mouth daily.   amLODipine  (NORVASC ) 10 MG tablet Take 10 mg by mouth daily.   apixaban  (ELIQUIS ) 5 MG TABS tablet Take 1 tablet (5 mg total) by mouth 2 (two) times daily.   aspirin  EC 81 MG tablet Take 81 mg by mouth daily.   atorvastatin  (LIPITOR) 10 MG tablet Take 1 tablet by mouth once daily   Blood Glucose Monitoring Suppl (ONETOUCH VERIO IQ SYSTEM) w/Device KIT 1 kit by Does not apply route 2 (two) times daily. E11.65   colchicine  0.6 MG tablet Take 0.6 mg by mouth every other day.   dicyclomine (BENTYL) 10 MG capsule SMARTSIG:1 Capsule(s) By Mouth PRN   furosemide  (LASIX ) 20 MG tablet Take 20 mg by mouth daily as needed.   gabapentin  (NEURONTIN ) 300 MG capsule Take 2 capsules (600 mg total) by mouth 3 (three) times daily.   glipiZIDE  (GLUCOTROL ) 5 MG tablet Take 1 tablet (5 mg total) by mouth daily before breakfast.   glucose blood (CARESENS N GLUCOSE TEST) test strip 1 each by Other route daily in the afternoon. Use as instructed   losartan  (COZAAR ) 25 MG tablet Take 25 mg by mouth at bedtime.   metFORMIN  (GLUCOPHAGE ) 1000 MG tablet Take 1 tablet (1,000 mg total) by mouth daily with breakfast.   metoprolol  tartrate (LOPRESSOR ) 50 MG tablet Take 1 tablet (50 mg total) by mouth 2 (two) times daily.   Multiple Vitamins tablet Take 1 tablet by mouth daily.   omeprazole (PRILOSEC) 20 MG capsule Take 20 mg by mouth daily as needed (acid reflux).   ondansetron  (ZOFRAN -ODT) 4 MG disintegrating tablet Take 1 tablet (4 mg total) by mouth every 8 (eight) hours as needed for nausea or vomiting.   phenazopyridine  (PYRIDIUM ) 200 MG tablet Take 1 tablet (200 mg total) by mouth 3 (three) times daily.   Polyethyl Glycol-Propyl Glycol (SYSTANE) 0.4-0.3 % SOLN 1 drop into affected eye  as needed Ophthalmic QD   polyethylene glycol (MIRALAX  / GLYCOLAX ) 17 g packet Take 17 g by mouth 2 (two) times daily.   Semaglutide ,0.25 or 0.5MG /DOS, 2 MG/3ML SOPN Inject 0.5 mg into the skin once a week.   senna-docusate (SENOKOT-S) 8.6-50 MG tablet Take 1 tablet by mouth 2 (two) times daily.    Physical Exam:    VS:  BP 138/68   Pulse 87   Ht 5' 2 (1.575 m)   Wt 169 lb (76.7 kg)   SpO2 96%   BMI 30.91 kg/m    Wt Readings from Last 3 Encounters:  07/13/23 169 lb (76.7 kg)  06/21/23 170 lb 6.4 oz (77.3 kg)  06/09/23 169 lb (76.7 kg)    GEN: Well nourished, well developed in no acute distress NECK: No JVD; No carotid bruits CARDIAC: RRR, no murmurs, rubs, gallops RESPIRATORY:  Clear to auscultation without rales, wheezing or rhonchi  ABDOMEN: Soft, non-tender, non-distended EXTREMITIES:  No edema; No acute deformity   Asessement and Plan:.    1. Dyspnea on exertion, chest discomfort of uncertain etiology, generalized weakness, fatigue - there is some chronicity to these symptoms with superimposed increased awareness primarily of  dyspnea with exertion. Suspect multifactorial as she is also very sedentary, complicated by concomitant LVH, recent PAF. Recent labs 06/09/23 showed some AKI as well. Agree with recommendation to proceed with 2D echocardiogram and cardiac PET as Dr. Shlomo plans. I will hold off making any medication changes myself today as I think we are still in the information-gathering phase. SBP recheck by me 130/60. Will update BMET, CBC, Mg, pBNP, CK level today. Takes colchicine  every other day; statin is at low dose hence why checking CK to ensure stable. Recent TSH wnl.   2. Paroxysmal atrial fibrillation -  back in NSR at AFib clinic visit, with Zio monitor in progress - just mailed back. Await AF clinic recommendations based on results. Continue Eliquis  and metoprolol  at present doses. Update baseline CBC.  3. Mild CAD - cardiac PET pending. Since she only has  history of mild atherosclerosis, I will reach out to Dr. Shlomo to find out if she is OK with patient discontinuing her ASA while on Eliquis . Does have hx of gastric ulcer per notes.  4. Left ventricular hypertrophy  - prior testing as outlined above. Await echocardiogram for further review. If there is any evidence of an LVOT gradient, would consider potentially transitioning amlodipine  more towards the ARB therapy. Takes Lasix  every day per daughter. Awaiting labs as a bove.  5. Hx of syncope - no full recurrence since 2023. She had a vague episode of weakness/vomiting on Mother's Day prior to last appt with Dr. Shlomo, labs suggested possibly some dehydration? Regardless, this has not recurred. If she has recurrent syncope in the future would consider referral to EP for ILR.    Disposition: F/u with Dr. Shlomo or myself after completion of testing in 09/2023.  Signed, Maygan Koeller N Carsyn Boster, PA-C

## 2023-07-13 ENCOUNTER — Encounter: Payer: Self-pay | Admitting: Physician Assistant

## 2023-07-13 ENCOUNTER — Ambulatory Visit: Attending: Cardiology | Admitting: Physician Assistant

## 2023-07-13 VITALS — BP 130/60 | HR 87 | Ht 62.0 in | Wt 169.0 lb

## 2023-07-13 DIAGNOSIS — I480 Paroxysmal atrial fibrillation: Secondary | ICD-10-CM

## 2023-07-13 DIAGNOSIS — I2510 Atherosclerotic heart disease of native coronary artery without angina pectoris: Secondary | ICD-10-CM | POA: Diagnosis not present

## 2023-07-13 DIAGNOSIS — R079 Chest pain, unspecified: Secondary | ICD-10-CM | POA: Diagnosis not present

## 2023-07-13 DIAGNOSIS — R531 Weakness: Secondary | ICD-10-CM

## 2023-07-13 DIAGNOSIS — I517 Cardiomegaly: Secondary | ICD-10-CM

## 2023-07-13 DIAGNOSIS — R0609 Other forms of dyspnea: Secondary | ICD-10-CM | POA: Diagnosis not present

## 2023-07-13 DIAGNOSIS — Z87898 Personal history of other specified conditions: Secondary | ICD-10-CM

## 2023-07-13 NOTE — Patient Instructions (Addendum)
 Medication Instructions:   Your physician recommends that you continue on your current medications as directed. Please refer to the Current Medication list given to you today.   *If you need a refill on your cardiac medications before your next appointment, please call your pharmacy*    Lab Work:  PLEASE GO DOWN STAIRS  [facility]  FIRST FLOOR   ( GET OFF ELEVATORS WALK TOWARDS WAITING AREA LAB LOCATED BY PHARMACY): BMET  MAG BNP CBC AND CREATINE LEVEL     If you have labs (blood work) drawn today and your tests are completely normal, you will receive your results only by: MyChart Message (if you have MyChart) OR A paper copy in the mail If you have any lab test that is abnormal or we need to change your treatment, we will call you to review the results.   Testing/Procedures: AS SCHEDULED  Follow-Up: Your next appointment:  AFTER SEPTEMBER   Provider: DAYNA DUNN   At [facility], you and your health needs are our priority.  As part of our continuing mission to provide you with exceptional heart care, our providers are all part of one team.  This team includes your primary Cardiologist (physician) and Advanced Practice Providers or APPs (Physician Assistants and Nurse Practitioners) who all work together to provide you with the care you need, when you need it.    We recommend signing up for the patient portal called MyChart.  Sign up information is provided on this After Visit Summary.  MyChart is used to connect with patients for Virtual Visits (Telemedicine).  Patients are able to view lab/test results, encounter notes, upcoming appointments, etc.  Non-urgent messages can be sent to your provider as well.   To learn more about what you can do with MyChart, go to [email or web address].   Other Instructions

## 2023-07-14 ENCOUNTER — Telehealth: Payer: Self-pay | Admitting: Physician Assistant

## 2023-07-14 NOTE — Telephone Encounter (Signed)
 Does not look like pt is a usual MyChart user. Please let pt know I discussed her meds with Dr. Shlomo - OK to stop aspirin  since she is now on Eliquis . I did not yet get lab results come through in the inbox from yesterday's visit so just remind to get drawn if she did not. Thank you!

## 2023-07-14 NOTE — Telephone Encounter (Signed)
 RN  spoke to patient,information was given per D.Dunn PA request.  Patient is aware to stop Aspirin   81 mg conitnue Eliquis . RN updated medication list .  Patient states she did get labs. She states she went right after office visit.

## 2023-07-18 NOTE — Telephone Encounter (Signed)
 RN contacted [facility]   Per their records patient did not have labwork done.   RN called patient and informed her that [facility] rep. stated no record of labs being drawn.   Patient states she did give blood that day  07/13/23 - patient states  The Lab rep  called the office because  she did not see order. Patient states she gave them a urine sample and the they drew blood.    RN informed patient will have contact [facility] tomorrow  as again for them to review who came that day  Patient voiced understanding.

## 2023-07-20 NOTE — Telephone Encounter (Signed)
 RN  contacted [facility]  07/18/23- they  stated the are investigating what happen. - they are contacting their supervisor. The supervisor should be present today 07/20/23.

## 2023-07-20 NOTE — Telephone Encounter (Signed)
 Delon, can you look into why results still not yet back? Would also notify [place]ford M/Emily H of this issue since lab problems are ongoing. Thank you.

## 2023-07-21 ENCOUNTER — Ambulatory Visit (HOSPITAL_COMMUNITY)
Admission: RE | Admit: 2023-07-21 | Discharge: 2023-07-21 | Disposition: A | Discharge status: Home / Self Care | Source: Ambulatory Visit | Attending: Cardiology | Admitting: Cardiology

## 2023-07-21 DIAGNOSIS — I4891 Unspecified atrial fibrillation: Secondary | ICD-10-CM | POA: Diagnosis not present

## 2023-07-21 LAB — ECHOCARDIOGRAM COMPLETE
Area-P 1/2: 3.03 cm2
S' Lateral: 2.4 cm

## 2023-07-22 NOTE — Telephone Encounter (Signed)
 Has there been any update on this? Labs still not resulted from 7/2. Please escalate to clinic supervisor please. Thank you.

## 2023-07-22 NOTE — Telephone Encounter (Signed)
 This information has been sent to [facility].

## 2023-07-22 NOTE — Addendum Note (Signed)
 Encounter addended by: Janel Nancy SAUNDERS, RN on: 07/22/2023 10:11 AM  Actions taken: Imaging Exam ended

## 2023-07-25 ENCOUNTER — Ambulatory Visit (HOSPITAL_COMMUNITY): Payer: Self-pay | Admitting: Internal Medicine

## 2023-07-26 NOTE — Telephone Encounter (Signed)
 Pt will come back to [facility] today to have labs re-drawn.

## 2023-07-26 NOTE — Telephone Encounter (Signed)
 If we cannot get a hold of these labs would ultimately have her return for the draw (ones ordered at recent OV with me) and make sure she is not charged for duplicate

## 2023-07-27 ENCOUNTER — Telehealth: Payer: Self-pay | Admitting: Cardiology

## 2023-07-27 ENCOUNTER — Other Ambulatory Visit: Payer: Self-pay

## 2023-07-27 ENCOUNTER — Ambulatory Visit: Payer: Self-pay | Admitting: Physician Assistant

## 2023-07-27 DIAGNOSIS — Z79899 Other long term (current) drug therapy: Secondary | ICD-10-CM

## 2023-07-27 DIAGNOSIS — I5032 Chronic diastolic (congestive) heart failure: Secondary | ICD-10-CM

## 2023-07-27 DIAGNOSIS — I10 Essential (primary) hypertension: Secondary | ICD-10-CM

## 2023-07-27 DIAGNOSIS — I272 Pulmonary hypertension, unspecified: Secondary | ICD-10-CM

## 2023-07-27 LAB — BASIC METABOLIC PANEL WITH GFR
BUN/Creatinine Ratio: 20 (ref 12–28)
BUN: 18 mg/dL (ref 8–27)
CO2: 21 mmol/L (ref 20–29)
Calcium: 9.7 mg/dL (ref 8.7–10.3)
Chloride: 99 mmol/L (ref 96–106)
Creatinine, Ser: 0.91 mg/dL (ref 0.57–1.00)
Glucose: 114 mg/dL — ABNORMAL HIGH (ref 70–99)
Potassium: 4.3 mmol/L (ref 3.5–5.2)
Sodium: 141 mmol/L (ref 134–144)
eGFR: 63 mL/min/1.73 (ref >59)

## 2023-07-27 LAB — CBC
Hematocrit: 37.1 % (ref 34.0–46.6)
Hemoglobin: 12 g/dL (ref 11.1–15.9)
MCH: 28.6 pg (ref 26.6–33.0)
MCHC: 32.3 g/dL (ref 31.5–35.7)
MCV: 89 fL (ref 79–97)
Platelets: 216 x10E3/uL (ref 150–450)
RBC: 4.19 x10E6/uL (ref 3.77–5.28)
RDW: 13.9 % (ref 11.7–15.4)
WBC: 6.4 x10E3/uL (ref 3.4–10.8)

## 2023-07-27 LAB — PRO B NATRIURETIC PEPTIDE: NT-Pro BNP: 189 pg/mL (ref 0–738)

## 2023-07-27 LAB — MAGNESIUM: Magnesium: 1.4 mg/dL — ABNORMAL LOW (ref 1.6–2.3)

## 2023-07-27 LAB — CK: Total CK: 72 U/L (ref 26–161)

## 2023-07-27 NOTE — Addendum Note (Signed)
 Addended by: Velmer Broadfoot L on: 07/27/2023 09:38 AM   Modules accepted: Orders

## 2023-07-27 NOTE — Telephone Encounter (Signed)
 Pt returning call for Echo results, requesting cb

## 2023-07-27 NOTE — Telephone Encounter (Signed)
(  Her daughter helps her with her MyChart, so she had not seen this result)  Hi Nancy [name], Dr. Shlomo reviewed your echo results. She says that your echo showed normal pumping function of your left ventricle, which is good news. You had moderately thickened heart muscle and increased stiffness of heart muscle which can be expected just with aging sometimes. You also had an enlarged left atrium. Right atrial pressure is normal which is consistent with normal filling pressures. Dr. Shlomo would like to have you do some lab work to check a BNP to make sure your heart isn't working too hard. I will put in lab orders, you can go to any [facility] sometime in the next week to get it done. You do not need to be fasting. Please let me know if you have any questions, Geni, RN   Gave the patient all of the information above. She reports that she got lab work drawn yesterday. She did have pro BNP drawn yesterday. Informed her that she should receive her lab results soon, and they will let her know if she needs to get any further lab work.  She verbalized understanding.

## 2023-07-27 NOTE — Telephone Encounter (Signed)
 Call to patient re: echo results. No answer, left detailed message per DPR advising that Echo showed normal LVF EF 55-60% with moderately thickened heart muscle and increased stiffness of heart muscle, enlarged left atrium. Right atrial pressure is normal which is consistent with normal filling pressures. Advised Dr. Shlomo would like her to do BNP at her earliest convenience. Order placed, [facility] sent.

## 2023-07-27 NOTE — Telephone Encounter (Signed)
-----   Message from Wilbert Bihari sent at 07/24/2023  9:11 PM EDT ----- Please have her come in for a BNP ----- Message ----- From: Interface, [employer] Sent: 07/21/2023   4:24 PM EDT To: Wilbert JONELLE Bihari, MD

## 2023-08-08 ENCOUNTER — Ambulatory Visit: Admitting: Podiatry

## 2023-09-09 ENCOUNTER — Ambulatory Visit: Admitting: Internal Medicine

## 2023-09-09 VITALS — BP 122/80 | HR 82 | Ht 62.0 in | Wt 174.0 lb

## 2023-09-09 DIAGNOSIS — E1159 Type 2 diabetes mellitus with other circulatory complications: Secondary | ICD-10-CM | POA: Diagnosis not present

## 2023-09-09 DIAGNOSIS — E1122 Type 2 diabetes mellitus with diabetic chronic kidney disease: Secondary | ICD-10-CM | POA: Diagnosis not present

## 2023-09-09 DIAGNOSIS — Z7984 Long term (current) use of oral hypoglycemic drugs: Secondary | ICD-10-CM

## 2023-09-09 DIAGNOSIS — Z7985 Long-term (current) use of injectable non-insulin antidiabetic drugs: Secondary | ICD-10-CM

## 2023-09-09 DIAGNOSIS — N1832 Chronic kidney disease, stage 3b: Secondary | ICD-10-CM | POA: Diagnosis not present

## 2023-09-09 DIAGNOSIS — E1142 Type 2 diabetes mellitus with diabetic polyneuropathy: Secondary | ICD-10-CM

## 2023-09-09 LAB — POCT GLYCOSYLATED HEMOGLOBIN (HGB A1C): Hemoglobin A1C: 5.8 % — AB (ref 4.0–5.6)

## 2023-09-09 MED ORDER — SEMAGLUTIDE(0.25 OR 0.5MG/DOS) 2 MG/3ML ~~LOC~~ SOPN: 0.5000 mg | PEN_INJECTOR | SUBCUTANEOUS | 3 refills | Status: AC

## 2023-09-09 MED ORDER — METFORMIN HCL 1000 MG PO TABS: 1000.0000 mg | ORAL_TABLET | Freq: Every day | ORAL | 3 refills | Status: AC

## 2023-09-09 NOTE — Progress Notes (Signed)
 Name: Nancy [name]  Age/ Sex: 83 y.o., female   MRN/ DOB: [id number], 02/28/1940     PCP: Patient, No Pcp Per   Reason for Endocrinology Evaluation: Type 2 Diabetes Mellitus  Initial Endocrine Consultative Visit: 10/31/2017    PATIENT IDENTIFIER: Nancy [name] is a 83 y.o. female with a past medical history of HTN, Gout, Hyperlipidemia, and T2 DM . The patient has followed with Endocrinology clinic since 10/31/17 for consultative assistance with management of her diabetes.  DIABETIC HISTORY:  Nancy [name] was diagnosed with T2DM ~ in 2009, she has been on oral glycemic agents since her diagnosis. Has not been on insulin  prior to her presentation here. Her hemoglobin A1c has ranged from 7.3% in 09/2015, peaking at 8.6% in 08/2015.  Glipizide  stopped 06/2019 with an A1c of 6.0 %, but had to be started 07/2022 due to hypoglycemia  Started Farxiga  09/24/2020 Started Rybelsus  May 2023 Discontinued Farxiga  01/2023 due to recurrent cystitis Switch Rybelsus  to Ozempic  April, 2025 with an A1c of 7.8%   Amlodipine  was discontinued by nephrology due to lower extremity edema August, 2025  SUBJECTIVE:   During the last visit (05/10/2023): A1c 7.8%     Today (09/09/2023): Nancy [name] is here for a  follow up on diabetes management. She has been checking glucose occasionally. No hypoglycemia    She follows with  cardiology  for nonobstructive CAD, she was recently evaluated for dyspnea on exertion and chest discomfort.  Echo was performed Had a follow-up with podiatry 05/09/2023 She is on Losartan  through nephrology   Has rare nausea No constipation or diarrhea  Continues with fatigue  She does have chronic neuropathic symptoms, she takes gabapentin  twice a day instead of 3x daily  , she also has noted unsteady gait.  Of note, the patient does have osteoarthritis of the knees and has received intra-articular injections in the past.  Per patient she has tried physical therapy for  unsteady gait without help   HOME DIABETES REGIMEN:  Metfomrin 1000 mg daily Glipizide  5 mg daily Ozempic  0.5 mg weekly    METER DOWNLOAD SUMMARY: N/A   DIABETIC COMPLICATIONS: Microvascular complications:  Neuropathy Denies: retinopathy, nephropathy Last eye exam: Completed  02/2023     Macrovascular complications:   CAD(nonobstructive CAD) Denies: PVD, CVA    HISTORY:  Past Medical History:  Past Medical History:  Diagnosis Date   Allergy    Anemia    Aortic atherosclerosis (HCC)    Atrial tachycardia (HCC)    Breast cyst    left breast   CAD (coronary artery disease), native coronary artery    minimal ASCAD to the distal LM and mid LAD with 0-24% stenosis and Calcium  score of 26.   Chest pain    CKD (chronic kidney disease), stage III (HCC)    Complication of anesthesia 1986   slow to awaken after cholecystectomy   Diastolic dysfunction    with mild pulmonary HTN by echo 07/2011   Gastric ulcer    GERD (gastroesophageal reflux disease)    Gout    Hyperlipidemia    Hypertension    Hypertensive heart disease    Neuropathy    from diabetes per patient   Osteoarthritis    Osteopenia    Shortness of breath dyspnea    with activity   Sinus pause    Spine deformity    lower spine   Type II diabetes mellitus (HCC)    Past Surgical History:  Past Surgical History:  Procedure Laterality Date   ABDOMINAL ADHESION SURGERY     ABDOMINAL HYSTERECTOMY     ANUS SURGERY     for torn tissue   APPENDECTOMY     BREAST BIOPSY     BREAST LUMPECTOMY     left   CHOLECYSTECTOMY  1986   COLONOSCOPY     ESOPHAGOGASTRODUODENOSCOPY (EGD) WITH PROPOFOL  N/A 07/18/2013   Procedure: ESOPHAGOGASTRODUODENOSCOPY (EGD) WITH PROPOFOL ;  Surgeon: Elsie Cree, MD;  Location: [facility];  Service: Endoscopy;  Laterality: N/A;   EUS N/A 07/18/2013   Procedure: ESOPHAGEAL ENDOSCOPIC ULTRASOUND (EUS) RADIAL;  Surgeon: Elsie Cree, MD;  Location: [facility];  Service: Endoscopy;   Laterality: N/A;   IR GENERIC HISTORICAL  10/30/2015   IR US  GUIDE VASC ACCESS RIGHT 10/30/2015 Burnard Banter, PA-C [facility]   IR GENERIC HISTORICAL  10/30/2015   IR FLUORO GUIDE CV LINE RIGHT 10/30/2015 Burnard Banter, PA-C [facility]   LUMBAR WOUND DEBRIDEMENT N/A 10/01/2015   Procedure: LUMBAR WOUND DEBRIDEMENT;  Surgeon: Donaciano Sprang, MD;  Location: [facility];  Service: Orthopedics;  Laterality: N/A;   LUMBAR WOUND DEBRIDEMENT N/A 10/15/2015   Procedure: [facility] OUT AND CLOSURE OF BACK WOUND;  Surgeon: Donaciano Sprang, MD;  Location: [facility];  Service: Orthopedics;  Laterality: N/A;   MASS EXCISION Left 07/24/2015   Procedure: EXCISION MASS LEFT LONG FINGER;  Surgeon: Franky Curia, MD;  Location: [facility];  Service: Orthopedics;  Laterality: Left;   SPINAL FUSION N/A 09/17/2015   Procedure: FUSION POSTERIOR SPINAL MULTILEVEL L4- S1;  Surgeon: Donaciano Sprang, MD;  Location: [facility];  Service: Orthopedics;  Laterality: N/A;   TRANSFORAMINAL LUMBAR INTERBODY FUSION (TLIF) WITH PEDICLE SCREW FIXATION 1 LEVEL N/A 09/17/2015   Procedure: TRANSFORAMINAL LUMBAR INTERBODY FUSION (TLIF) WITH PEDICLE SCREW FIXATION 1 LEVEL , Lumbar 4-5;  Surgeon: Donaciano Sprang, MD;  Location[facility];  Service: Orthopedics;  Laterality: N/A;   Social History:  reports that she has never smoked. She has never used smokeless tobacco. She reports that she does not drink alcohol  and does not use drugs. Family History:  Family History  Problem Relation Age of Onset   Heart disease Mother    Cancer Sister        ovarian     HOME MEDICATIONS: Allergies as of 09/09/2023       Reactions   Ketorolac  Tromethamine  Swelling   10/02/15 - given Toradol  + morphine  and RN noted tongue swelling - given Epi + Benadryl  Has taken ibuprofen in past w/o problem Other reaction(s): Unknown   Morphine  Swelling   Face and tongue swelling   Morphine  Sulfate    Other reaction(s): Unknown   Codeine Other (See Comments)    headache Other reaction(s): Unknown        Medication List        Accurate as of September 09, 2023  2:20 PM. If you have any questions, ask your nurse or doctor.          allopurinol  100 MG tablet Commonly known as: ZYLOPRIM  Take 100 mg by mouth daily.   amLODipine  10 MG tablet Commonly known as: NORVASC  Take 10 mg by mouth daily.   apixaban  5 MG Tabs tablet Commonly known as: ELIQUIS  Take 1 tablet (5 mg total) by mouth 2 (two) times daily.   atorvastatin  10 MG tablet Commonly known as: LIPITOR Take 1 tablet by mouth once daily   CareSens N Glucose Test test strip Generic drug: glucose blood 1 each by Other route daily  in the afternoon. Use as instructed   colchicine  0.6 MG tablet Take 0.6 mg by mouth every other day.   dicyclomine 10 MG capsule Commonly known as: BENTYL SMARTSIG:1 Capsule(s) By Mouth PRN   furosemide  20 MG tablet Commonly known as: LASIX  Take 20 mg by mouth daily as needed.   gabapentin  300 MG capsule Commonly known as: NEURONTIN  Take 2 capsules (600 mg total) by mouth 3 (three) times daily.   glipiZIDE  5 MG tablet Commonly known as: GLUCOTROL  Take 1 tablet (5 mg total) by mouth daily before breakfast.   losartan  25 MG tablet Commonly known as: COZAAR  Take 25 mg by mouth at bedtime.   metFORMIN  1000 MG tablet Commonly known as: GLUCOPHAGE  Take 1 tablet (1,000 mg total) by mouth daily with breakfast.   metoprolol  tartrate 50 MG tablet Commonly known as: LOPRESSOR  Take 1 tablet (50 mg total) by mouth 2 (two) times daily.   Multiple Vitamins tablet Take 1 tablet by mouth daily.   omeprazole 20 MG capsule Commonly known as: PRILOSEC Take 20 mg by mouth daily as needed (acid reflux).   ondansetron  4 MG disintegrating tablet Commonly known as: ZOFRAN -ODT Take 1 tablet (4 mg total) by mouth every 8 (eight) hours as needed for nausea or vomiting.   OneTouch Verio IQ System w/Device Kit 1 kit by Does not apply route 2 (two) times  daily. E11.65   phenazopyridine  200 MG tablet Commonly known as: PYRIDIUM  Take 1 tablet (200 mg total) by mouth 3 (three) times daily.   polyethylene glycol 17 g packet Commonly known as: MIRALAX  / GLYCOLAX  Take 17 g by mouth 2 (two) times daily.   Semaglutide (0.25 or 0.5MG /DOS) 2 MG/3ML Sopn Inject 0.5 mg into the skin once a week.   senna-docusate 8.6-50 MG tablet Commonly known as: Senokot-S Take 1 tablet by mouth 2 (two) times daily.   Systane 0.4-0.3 % Soln Generic drug: Polyethyl Glycol-Propyl Glycol 1 drop into affected eye as needed Ophthalmic QD         OBJECTIVE:   Vital Signs: BP 122/80 (BP Location: Left Arm, Patient Position: Sitting, Cuff Size: Normal)   Pulse 82   Ht 5' 2 (1.575 m)   Wt 174 lb (78.9 kg)   SpO2 99%   BMI 31.83 kg/m   Wt Readings from Last 3 Encounters:  09/09/23 174 lb (78.9 kg)  07/13/23 169 lb (76.7 kg)  06/21/23 170 lb 6.4 oz (77.3 kg)     Exam: General: Pt appears well and is in NAD  Lungs: Clear with good BS bilat   Heart: RRR   Extremities: Trace pretibial edema.   Neuro: MS is good with appropriate affect, pt is alert and Ox3    DM foot exam: per podiatry 05/09/2023   DATA REVIEWED:   Latest Reference Range & Units 10/30/22 12:06  Sodium 135 - 145 mmol/L 135  Potassium 3.5 - 5.1 mmol/L 3.1 (L)  Chloride 98 - 111 mmol/L 98  CO2 22 - 32 mmol/L 28  Glucose 70 - 99 mg/dL 876 (H)  BUN 8 - 23 mg/dL 13  Creatinine 9.55 - 8.99 mg/dL 9.07  Calcium  8.9 - 10.3 mg/dL 9.9  Anion gap 5 - 15  9  Alkaline Phosphatase 38 - 126 U/L 98  Albumin  3.5 - 5.0 g/dL 4.1  Lipase 11 - 51 U/L 24  AST 15 - 41 U/L 41  ALT 0 - 44 U/L 35  Total Protein 6.5 - 8.1 g/dL 7.4  Total Bilirubin 0.3 - 1.2  mg/dL 1.7 (H)  GFR, Estimated >60 mL/min >60    In office BG 99 mg/DL   ASSESSMENT / PLAN / RECOMMENDATIONS:   1) Type 2 Diabetes Mellitus, controlled, With Neuropathic and macrovascular complications - Most recent A1c of 5.8  %. Goal A1c  < 7.0 %.    -A1c has trended down from 7.8% to 5.8% - We had reduced her metformin  by 50% in the past due to GFR less than 45 -We discontinued Farxiga  due to recurrent UTI and sepsis -Patient opted to remain on current dose of Ozempic  - We will discontinue glipizide    MEDICATIONS: - Stop glipizide  -Continue metformin  1000 mg ONCE daily  -Continue Ozempic  0.5 mg weekly   EDUCATION / INSTRUCTIONS: BG monitoring instructions: Patient is instructed to check her blood sugars 1 times a day, fasting  Call [facility] [place] [facility] if: BG persistently < 70  I reviewed the Rule of 15 for the treatment of hypoglycemia in detail with the patient. Literature supplied.   2) Diabetic complications:  Eye: Does not have known diabetic retinopathy.  Neuro/ Feet: Does  have known diabetic peripheral neuropathy. Renal: Patient does  have known baseline CKD. She is on an ACEI/ARB at present. Normal Ma/Cr ratio     3) Peripheral Neuropathy:  - Patient has gabapentin  prescribed for 3 times a day, but most of the time she takes it twice a day, patient is skeptical regarding side effects to gabapentin  - I did recommend Qutenza, she is interested - I referral to physical medicine rehab has been placed   F/U in 6 months   Signed electronically by: Stefano Redgie Butts, MD  [facility]  [facility] [address] [place], [place] [address] Phone: [phone] FAX: [phone]   CC:  Patient, No Pcp Per No address on file Phone: None  Fax: None  Return to Endocrinology clinic as below: Future Appointments  Date Time Provider Department Center  09/14/2023 10:40 AM [facility] 1 [facility] [place]  10/13/2023 10:30 AM Dunn, Raphael SAILOR, PA-C [facility]  01/24/2024  2:00 PM Terra Fairy PARAS, PA-C [facility]

## 2023-09-09 NOTE — Patient Instructions (Signed)
-   Stop Glipizide  5 mg, 1 tablet daily  - Continue Metformin  1000 mg one tablet day  - Continue  Ozempic  0.5 mg weekly    - HOW TO TREAT LOW BLOOD SUGARS (Blood sugar LESS THAN 70 MG/DL) Please follow the RULE OF 15 for the treatment of hypoglycemia treatment (when your (blood sugars are less than 70 mg/dL)   STEP 1: Take 15 grams of carbohydrates when your blood sugar is low, which includes:  3-4 GLUCOSE TABS  OR 3-4 OZ OF JUICE OR REGULAR SODA OR ONE TUBE OF GLUCOSE GEL    STEP 2: RECHECK blood sugar in 15 MINUTES STEP 3: If your blood sugar is still low at the 15 minute recheck --> then, go back to STEP 1 and treat AGAIN with another 15 grams of carbohydrates.

## 2023-09-10 ENCOUNTER — Encounter (HOSPITAL_COMMUNITY): Payer: Self-pay

## 2023-09-14 ENCOUNTER — Ambulatory Visit (HOSPITAL_COMMUNITY)
Admission: RE | Admit: 2023-09-14 | Discharge: 2023-09-14 | Disposition: A | Discharge status: Home / Self Care | Source: Ambulatory Visit | Attending: Cardiology | Admitting: Cardiology

## 2023-09-14 DIAGNOSIS — R0600 Dyspnea, unspecified: Secondary | ICD-10-CM | POA: Insufficient documentation

## 2023-09-14 DIAGNOSIS — R079 Chest pain, unspecified: Secondary | ICD-10-CM | POA: Insufficient documentation

## 2023-09-14 LAB — NM PET CT CARDIAC PERFUSION MULTI W/ABSOLUTE BLOODFLOW
LV dias vol: 56 mL (ref 46–106)
LV sys vol: 15 mL (ref 3.8–5.2)
MBFR: 2.33
Nuc Rest EF: 56 %
Nuc Stress EF: 61 %
Peak HR: 91 {beats}/min
Rest HR: 71 {beats}/min
Rest MBF: 1.16 ml/g/min
Rest Nuclear Isotope Dose: 20.3 mCi
ST Depression (mm): 0 mm
Stress MBF: 2.7 ml/g/min
Stress Nuclear Isotope Dose: 20.7 mCi

## 2023-09-14 MED ORDER — RUBIDIUM RB82 GENERATOR (RUBYFILL)
20.3300 | PACK | Freq: Once | INTRAVENOUS | Status: AC
Start: 2023-09-14 — End: 2023-09-14
  Administered 2023-09-14: 20.33 via INTRAVENOUS

## 2023-09-14 MED ORDER — REGADENOSON 0.4 MG/5ML IV SOLN
INTRAVENOUS | Status: AC
  Filled 2023-09-14: qty 5

## 2023-09-14 MED ORDER — REGADENOSON 0.4 MG/5ML IV SOLN
0.4000 mg | Freq: Once | INTRAVENOUS | Status: AC
  Administered 2023-09-14: 0.4 mg via INTRAVENOUS

## 2023-09-14 MED ORDER — RUBIDIUM RB82 GENERATOR (RUBYFILL)
20.7400 | PACK | Freq: Once | INTRAVENOUS | Status: AC
  Administered 2023-09-14: 20.74 via INTRAVENOUS

## 2023-09-21 NOTE — Telephone Encounter (Signed)
 Call to patient to advise that stress test is fine per Dr. Shlomo. Patient states she has not had any chest pain since her stress test, does endorse some minor chest tightness. Patient responses forwarded to Dr. Shlomo.

## 2023-09-21 NOTE — Telephone Encounter (Signed)
-----   Message from Wilbert Bihari sent at 09/14/2023  7:29 PM EDT ----- Please let patient know that stress test was fine ----- Message ----- From: Interface, Rad Results In Sent: 09/14/2023  12:16 PM EDT To: Wilbert JONELLE Bihari, MD

## 2023-10-04 ENCOUNTER — Encounter: Payer: Self-pay | Admitting: Physical Medicine and Rehabilitation

## 2023-10-12 NOTE — Progress Notes (Deleted)
 Cardiology Office Note    Date:  10/12/2023  ID:  SANTANA GOSDIN, DOB 1940/05/01, MRN [id number] PCP:  Campbell Reynolds, NP  Cardiologist:  Wilbert Bihari, MD  Electrophysiologist:  None   Chief Complaint: ***  History of Present Illness: Nancy [name] is a 83 y.o. female with visit-pertinent history of  hypertension, hypertensive heart disease, mild coronary atherosclerosis by cor CT, brief atrial tachycardia and sinus pauses, PACs, PVCs, PAF, sleep apnea on CPAP followed by Dr. Bihari, gastric ulcer, GERD, hyperlipidemia, neuropathy from diabetes, osteoarthritis, previous mild pulmonary HTN (resolved), spine deformity, chronic appearing anemia, chronic kidney disease stage 3a,who is seen for follow-up.   She has followed with cardiology for chest pain, SOB, palpitations, dizziness, and fatigue. Monitor 2019 showed HR 50-164bpm, occasional PACs/PVCs. Repeat monitor 07/2019 showed NSR, average heart rate 71 bpm, range 58-103, 1 brief atrial tachycardia. There were 2 episodes of sinus pauses up to 3.5 seconds (once during sleep at 30 bpm and the other occurred 8:37pm at 30 bpm). Palpitations correlated with NSR. Due to dizziness, metoprolol  dose was reduced. Amlodipine  was also reduced. 2D echo 2021 demonstrated severe asymmetric basal septal hypertrophy so cMRI was pursued 12/2019 showing no evidence of infiltrative heart disease, findings felt c/w hypertensive heart disease. Pulmonary HTN was previously reported on remote echo but not the case in more recent testing. She was admitted 04/2021 with syncope on the commode felt vasovagal. She also was found to have hypotension, UTI, AKI requiring hydration, antibiotics, and discontinuation of loartan. Echo felt consistent with prior. She underwent repeat outpatient coronary CTA showing mild nonobstructive CAD. Overread showed small hiatal hernia, aortic atherosclerosis and possible bronchomalacia for which Dr. Bihari recommended f/u PCP. F/u echo  03/2022 showed EF 60-65%, moderate asymmetric LVH BSS without LVOT gradient, normal PASP. Over the last few years it appears amlodipine  was eventually increased back to 10mg  daily and losartan  added back.   She was seen for routine f/u by Dr. Bihari 05/2023 and noted to be in new onset atrial fibrillation HR 1teens. She was also having increased chest pain/SOB so cardiac PET/echo were ordered. Metoprolol  was increased and she was started on Eliquis . She saw afib clinic 06/21/23 at which time she was back in NSR, therefore monitor was ordered. Echo 07/2023 showed EF 55-60%, moderate LVHBSS, G1DD, mild-moderate LAE without significant change. Monitor showed NSR, 28 brief PSVT (longest 9.6 sec), 2.7% PACs, rare PVCs, no afib. Dr. Bihari advised to continue metoprolol  and to get help with CPAP mask. Cardiac PET was low risk without ischemia, mild coronary calcification.   Mg 1.4  DOE, chest discomfort, mild CAD Paroxysmal atrial fibrillation, PACs LVH Hypomagnesemia  Labwork independently reviewed: 07/2023 CK wnl, pro bnp wnl, CBC wnl, Mg 1.4, K 4.3, Cr 0.91 05/2023 TSH OK, ALT 38, LDL 45, trig 96  ROS: .    Please see the history of present illness. Otherwise, review of systems is positive for ***.  All other systems are reviewed and otherwise negative.  Studies Reviewed: Nancy    EKG:  EKG is ordered today, personally reviewed, demonstrating ***  CV Studies: Cardiac studies reviewed are outlined and summarized above. Otherwise please see EMR for full report.   Current Reported Medications:.    No outpatient medications have been marked as taking for the 10/13/23 encounter (Appointment) with Briggette Najarian N, PA-C.    Physical Exam:    VS:  There were no vitals taken for this visit.   Wt Readings from  Last 3 Encounters:  09/09/23 174 lb (78.9 kg)  07/13/23 169 lb (76.7 kg)  06/21/23 170 lb 6.4 oz (77.3 kg)    GEN: Well nourished, well developed in no acute distress NECK: No JVD; No carotid  bruits CARDIAC: ***RRR, no murmurs, rubs, gallops RESPIRATORY:  Clear to auscultation without rales, wheezing or rhonchi  ABDOMEN: Soft, non-tender, non-distended EXTREMITIES:  No edema; No acute deformity   Asessement and Plan:.     ***     Disposition: F/u with ***  Signed, Melannie Metzner N Sheilla Maris, PA-C

## 2023-10-13 ENCOUNTER — Ambulatory Visit: Admitting: Physician Assistant

## 2023-10-13 DIAGNOSIS — I491 Atrial premature depolarization: Secondary | ICD-10-CM

## 2023-10-13 DIAGNOSIS — R0609 Other forms of dyspnea: Secondary | ICD-10-CM

## 2023-10-13 DIAGNOSIS — I471 Supraventricular tachycardia, unspecified: Secondary | ICD-10-CM

## 2023-10-13 DIAGNOSIS — I2510 Atherosclerotic heart disease of native coronary artery without angina pectoris: Secondary | ICD-10-CM

## 2023-10-13 DIAGNOSIS — I517 Cardiomegaly: Secondary | ICD-10-CM

## 2023-10-13 DIAGNOSIS — R0789 Other chest pain: Secondary | ICD-10-CM

## 2023-10-13 DIAGNOSIS — I480 Paroxysmal atrial fibrillation: Secondary | ICD-10-CM

## 2023-11-02 ENCOUNTER — Encounter: Attending: Physical Medicine and Rehabilitation | Admitting: Physical Medicine and Rehabilitation

## 2023-11-02 ENCOUNTER — Encounter: Payer: Self-pay | Admitting: Physical Medicine and Rehabilitation

## 2023-11-02 VITALS — BP 175/80 | HR 76 | Ht 62.0 in | Wt 171.4 lb

## 2023-11-02 DIAGNOSIS — G8929 Other chronic pain: Secondary | ICD-10-CM | POA: Diagnosis present

## 2023-11-02 DIAGNOSIS — G5603 Carpal tunnel syndrome, bilateral upper limbs: Secondary | ICD-10-CM | POA: Insufficient documentation

## 2023-11-02 DIAGNOSIS — Z794 Long term (current) use of insulin: Secondary | ICD-10-CM

## 2023-11-02 DIAGNOSIS — M5441 Lumbago with sciatica, right side: Secondary | ICD-10-CM | POA: Diagnosis present

## 2023-11-02 DIAGNOSIS — E1142 Type 2 diabetes mellitus with diabetic polyneuropathy: Secondary | ICD-10-CM | POA: Insufficient documentation

## 2023-11-02 DIAGNOSIS — G4700 Insomnia, unspecified: Secondary | ICD-10-CM | POA: Insufficient documentation

## 2023-11-02 MED ORDER — AMITRIPTYLINE HCL 25 MG PO TABS: 25.0000 mg | ORAL_TABLET | Freq: Every day | ORAL | 2 refills | Status: AC

## 2023-11-02 MED ORDER — GABAPENTIN 300 MG PO CAPS: ORAL_CAPSULE | ORAL | 0 refills | Status: AC

## 2023-11-02 NOTE — Patient Instructions (Addendum)
  VISIT SUMMARY: You visited us  today for chronic pain management related to your type 2 diabetes and diabetic polyneuropathy. We discussed your symptoms, current medications, and made some adjustments to help manage your pain and improve your quality of life.  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 -Sent referral to physical therapy for gait training and strengthening exercises.  TYPE 2 DIABETES MELLITUS WITH DIABETIC POLYNEUROPATHY: You have type 2 diabetes with diabetic polyneuropathy, causing burning, numbness, and tingling in your hands and feet. Your current medication, gabapentin , is causing daytime sleepiness. -We are prescribing amitriptyline 25 mg at night to help with sleep and nerve pain. -Adjust your gabapentin  to 300 mg during the day and 600 mg at night. -You can use over-the-counter lidocaine  or capsaicin cream on your feet at night.  CARPAL TUNNEL SYNDROME -Use neutral wrist braces at night for 6 weeks to help with potential carpal tunnel syndrome. - We will consider EMG next visit if no improvement   HYPERTENSION -Monitor your blood pressure regularly and discuss with your primary care doctor if it remains high. -We may consider Qutenza if your blood pressure is controlled and after trying the new medications.  INSOMNIA Pick the same time to lay down every night, ideally between 8 and 10 PM.    Starting 1 hour before you want to go to sleep, turn off all television screens, phone screens, tablets, and computers.    Keep the lights low and perform only low stimulation activities, such as reading.    Only use your bedroom for sleep and sex. Avoid daytime naps and limit any time spent in your bed that is not dedicated to sleep.   You may also take 3 to 5 mg of over-the-counter melatonin approximately 1 hour before bedtime, or if you  are prescribed a medication for sleep take it at this time.  Avoid alcohol , decongestants/pseudoephedrine, antihistamines, caffeine, and nicotine a few hours before bedtime, as these can reduce your quality of sleep.  FOLLOW-UP: We need to assess how well the new medication adjustments are working and manage any symptoms. -Schedule a follow-up appointment in 6-8 weeks. -Contact our office or use MyChart if you experience any adverse effects or have concerns with the new medications.   Contains text generated by [employer].

## 2023-11-02 NOTE — Progress Notes (Signed)
 Subjective:    Patient ID: Nancy [name], female    DOB: 05-01-1940, 83 y.o.   MRN: [id number]  HPI Discussed the use of AI scribe software for clinical note transcription with the patient, who gave verbal consent to proceed.  History of Present Illness   Nancy BROERS Ms. Azula is an 83 year old female with type 2 diabetes and diabetic polyneuropathy who presents for chronic pain management. She was referred by her   Dr. Sam [facility]ocr[facility] for chronic neuropathy; consideration of qutenza.   She experiences burning, numbness, tingling, and pain in her hands and feet. These symptoms have persisted for over a year in her hands and several years in her feet. In her hands, symptoms are primarily in her fingers, especially the pointer fingers, and occasionally affect her thumbs. She uses a glove to alleviate symptoms, and does have to shake it out to improve the pain after certain activities with her hands. Weakness in her hands makes it difficult to open or pick up objects.  In her feet, symptoms sometimes extend to her ankles, with sharp pains and a sensation of something under her skin. She occasionally feels numbness in certain toes, which she does not notice unless she hits them. She has no wounds on her feet and sees a podiatrist regularly. She massages her feet and has a machine for them, though she has not used it recently.  She takes gabapentin  600 mg twice daily, although it is prescribed three times daily. She has not tried any other medications, creams, or patches for nerve pain.   She experiences daytime sleepiness and often falls asleep when sitting down. She has a history of sleep apnea and uses a CPAP machine, though she does not notice a difference in her sleep quality. She goes to bed as late as 5 or 6 AM and wakes up around 9 AM, napping during the day while sitting in a chair.  Her last hemoglobin A1c was 5.7, and she continues to take Ozempic . Her blood  sugar was last recorded at 99. She takes a multivitamin daily. She also takes metformin , metoprolol , and Eliquis , spacing them out to avoid gastrointestinal upset. Her blood pressure is usually well-controlled, though it was elevated at the visit.       Pain Inventory Average Pain 9 Pain Right Now 4 My pain is burning, dull, and tingling  In the last 24 hours, has pain interfered with the following? General activity 3 Relation with others 7 Enjoyment of life 1 What TIME of day is your pain at its worst? varies Sleep (in general) Poor  Pain is worse with: varies Pain improves with: medication Relief from Meds: 3  use a cane how many minutes can you walk? 2 hours but, varies ability to climb steps?  yes do you drive?  yes  retired  weakness tingling trouble walking  Any changes since last visit?  no  Primary care Damian Christians, NP Orthopedist Dr. Addie, MD    Family History  Problem Relation Age of Onset   Heart disease Mother    Cancer Sister        ovarian   Social History   Socioeconomic History   Marital status: Divorced    Spouse name: Not on file   Number of children: 4   Years of education: 12   Highest education level: Not on file  Occupational History   Not on file  Tobacco Use   Smoking status: Never   Smokeless  tobacco: Never  Vaping Use   Vaping status: Never Used  Substance and Sexual Activity   Alcohol  use: No    Comment: occasional wine   Drug use: No   Sexual activity: Not Currently  Other Topics Concern   Not on file  Social History Narrative   Patient is single, has 3 children living 1 deceased   Patient is right handed   Education level is 12   Caffeine consumption is 0   Social Drivers of Corporate investment banker Strain: Not on file  Food Insecurity: Low Risk  (02/22/2023)   Received from [facility]   Hunger Vital Sign    Within the past 12 months, you worried that your food would run out before you got money to buy  more: Never true    Within the past 12 months, the food you bought just didn't last and you didn't have money to get more. : Never true  Transportation Needs: No Transportation Needs (02/22/2023)   Received from [facility]    In the past 12 months, has lack of reliable transportation kept you from medical appointments, meetings, work or from getting things needed for daily living? : No  Physical Activity: Not on file  Stress: Not on file  Social Connections: Not on file   Past Surgical History:  Procedure Laterality Date   ABDOMINAL ADHESION SURGERY     ABDOMINAL HYSTERECTOMY     ANUS SURGERY     for torn tissue   APPENDECTOMY     BREAST BIOPSY     BREAST LUMPECTOMY     left   CHOLECYSTECTOMY  1986   COLONOSCOPY     ESOPHAGOGASTRODUODENOSCOPY (EGD) WITH PROPOFOL  N/A 07/18/2013   Procedure: ESOPHAGOGASTRODUODENOSCOPY (EGD) WITH PROPOFOL ;  Surgeon: Elsie Cree, MD;  Location: [facility];  Service: Endoscopy;  Laterality: N/A;   EUS N/A 07/18/2013   Procedure: ESOPHAGEAL ENDOSCOPIC ULTRASOUND (EUS) RADIAL;  Surgeon: Elsie Cree, MD;  Location: [facility];  Service: Endoscopy;  Laterality: N/A;   IR GENERIC HISTORICAL  10/30/2015   IR US  GUIDE VASC ACCESS RIGHT 10/30/2015 Burnard Banter, PA-C [facility]   IR GENERIC HISTORICAL  10/30/2015   IR FLUORO GUIDE CV LINE RIGHT 10/30/2015 Burnard Banter, PA-C [facility]   LUMBAR WOUND DEBRIDEMENT N/A 10/01/2015   Procedure: LUMBAR WOUND DEBRIDEMENT;  Surgeon: Donaciano Sprang, MD;  Location: [facility];  Service: Orthopedics;  Laterality: N/A;   LUMBAR WOUND DEBRIDEMENT N/A 10/15/2015   Procedure: [facility] OUT AND CLOSURE OF BACK WOUND;  Surgeon: Donaciano Sprang, MD;  Location: [facility];  Service: Orthopedics;  Laterality: N/A;   MASS EXCISION Left 07/24/2015   Procedure: EXCISION MASS LEFT LONG FINGER;  Surgeon: Franky Curia, MD;  Location: [facility];  Service: Orthopedics;  Laterality: Left;   SPINAL FUSION N/A  09/17/2015   Procedure: FUSION POSTERIOR SPINAL MULTILEVEL L4- S1;  Surgeon: Donaciano Sprang, MD;  Location: [facility];  Service: Orthopedics;  Laterality: N/A;   TRANSFORAMINAL LUMBAR INTERBODY FUSION (TLIF) WITH PEDICLE SCREW FIXATION 1 LEVEL N/A 09/17/2015   Procedure: TRANSFORAMINAL LUMBAR INTERBODY FUSION (TLIF) WITH PEDICLE SCREW FIXATION 1 LEVEL , Lumbar 4-5;  Surgeon: Donaciano Sprang, MD;  Location: [facility];  Service: Orthopedics;  Laterality: N/A;   Past Medical History:  Diagnosis Date   Allergy    Anemia    Aortic atherosclerosis    Atrial tachycardia    Breast cyst    left breast   CAD (coronary artery disease),  native coronary artery    minimal ASCAD to the distal LM and mid LAD with 0-24% stenosis and Calcium  score of 26.   Chest pain    CKD (chronic kidney disease), stage III (HCC)    Complication of anesthesia 1986   slow to awaken after cholecystectomy   Diastolic dysfunction    with mild pulmonary HTN by echo 07/2011   Gastric ulcer    GERD (gastroesophageal reflux disease)    Gout    Hyperlipidemia    Hypertension    Hypertensive heart disease    Neuropathy    from diabetes per patient   Osteoarthritis    Osteopenia    Shortness of breath dyspnea    with activity   Sinus pause    Spine deformity    lower spine   Type II diabetes mellitus (HCC)    BP (!) 175/80 (BP Location: Left Arm, Patient Position: Sitting, Cuff Size: Large)   Pulse 76   Ht 5' 2 (1.575 m)   Wt 171 lb 6.4 oz (77.7 kg)   SpO2 96%   BMI 31.35 kg/m   Opioid Risk Score:   Fall Risk Score:  `1  Depression screen [facility] 2/9     11/02/2023   11:26 AM 12/19/2017   11:28 AM 12/18/2015    2:10 PM  Depression screen PHQ 2/9  Decreased Interest 0 0 0  Down, Depressed, Hopeless 0 0 0  PHQ - 2 Score 0 0 0  Altered sleeping 3    Tired, decreased energy 3    Change in appetite 1    Feeling bad or failure about yourself  0    Trouble concentrating 0    Moving slowly or fidgety/restless 0    Suicidal  thoughts 0    PHQ-9 Score 7    Difficult doing work/chores Not difficult at all        Review of Systems  Respiratory:  Positive for apnea and shortness of breath.        Sleep apnea with CPAP  Cardiovascular:        Hypertension  Gastrointestinal:  Positive for abdominal pain, nausea and vomiting.  Endocrine:       DM II  Musculoskeletal:  Positive for arthralgias, back pain and myalgias.       Bilateral hand, feet and knee pain, low back pain  All other systems reviewed and are negative.      Objective:   Physical Exam   PE: Constitution: Appropriate appearance for age. No apparent distress  Resp: No respiratory distress. No accessory muscle usage. on RA and CTAB Cardio: Well perfused appearance.  No peripheral edema. Abdomen: Nondistended. Nontender.   Psych: Appropriate mood and affect. Neuro: AAOx4. No apparent cognitive deficits   Neurologic Exam:   DTRs: Reflexes were 2+ in bilateral achilles, patella, biceps, BR and triceps. Babinsky: flexor responses b/l.   Hoffmans: negative b/l Sensory exam: revealed reduced sensaiton to light touch in bilateral hands along 1st web space and 1-2nd digits; otherwise preserved - Tinel's at wrists or elbows - Reverse Phalen's test  Sensory reduction in bilateral feet extending to just below the knees  Motor exam: strength 5/5 throughout bilateral upper extremities and bilateral lower extremities Coordination: Fine motor coordination was normal.   + Balance deficits in LE  Gait: Stiff, forward flexed, with weakness in R hip and some meandering with turns; uses single point cane in R hand.          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 -Sent referral to physical therapy for gait training and strengthening exercises.  TYPE 2 DIABETES MELLITUS WITH  DIABETIC POLYNEUROPATHY: You have type 2 diabetes with diabetic polyneuropathy, causing burning, numbness, and tingling in your hands and feet. Your current medication, gabapentin , is causing daytime sleepiness. -We are prescribing amitriptyline 25 mg at night to help with sleep and nerve pain. -Adjust your gabapentin  to 300 mg during the day and 600 mg at night to reduce daytime sleepiness -You can use over-the-counter lidocaine  or capsaicin cream on your feet at night. - OK to take magnesium  supplement QHS  CARPAL TUNNEL SYNDROME -Use neutral wrist braces at night for 6 weeks to help with potential carpal tunnel syndrome. - We will consider EMG next visit if no improvement  HYPERTENSION -Monitor your blood pressure regularly and discuss with your primary care doctor if it remains high. -We may consider Qutenza if your blood pressure is controlled and after trying the new medications; would not administer if BP >170 due to stroke risk.   INSOMNIA/OSA - compliant with CPAP but with very poor sleep hygiene.  Pick the same time to lay down every night, ideally between 8 and 10 PM.    Starting 1 hour before you want to go to sleep, turn off all television screens, phone screens, tablets, and computers.    Keep the lights low and perform only low stimulation activities, such as reading.    Only use your bedroom for sleep and sex. Avoid daytime naps and limit any time spent in your bed that is not dedicated to sleep.   You may also take 3 to 5 mg of over-the-counter melatonin approximately 1 hour before bedtime, or if you are prescribed a medication for sleep take it at this time.  Avoid alcohol , decongestants/pseudoephedrine, antihistamines, caffeine, and nicotine a few hours before bedtime, as these can reduce your quality of sleep.    FOLLOW-UP: We need to assess how well the new medication adjustments are working and manage any symptoms. -Schedule a follow-up appointment in 6-8  weeks. -Contact our office or use MyChart if you experience any adverse effects or have concerns with the new medications.

## 2023-11-07 NOTE — Progress Notes (Deleted)
 Cardiology Office Note    Date:  10/12/2023  ID:  CATTALEYA WIEN, DOB 11/24/1940, MRN [id number] PCP:  Campbell Reynolds, NP  Cardiologist:  Wilbert Bihari, MD  Electrophysiologist:  None   Chief Complaint: ***  History of Present Illness: Nancy [name]    Nancy EASTEP is a 83 y.o. female with visit-pertinent history of  hypertension, hypertensive heart disease, mild coronary atherosclerosis by cor CT, brief atrial tachycardia and sinus pauses, PACs, PVCs, PAF, sleep apnea on CPAP followed by Dr. Bihari, gastric ulcer, GERD, hyperlipidemia, neuropathy from diabetes, osteoarthritis, previous mild pulmonary HTN (resolved), spine deformity, chronic appearing anemia, chronic kidney disease stage 3a,who is seen for follow-up.   She has followed with cardiology for chest pain, SOB, palpitations, dizziness, and fatigue. Monitor 2019 showed HR 50-164bpm, occasional PACs/PVCs. Repeat monitor 07/2019 showed NSR, average heart rate 71 bpm, range 58-103, 1 brief atrial tachycardia. There were 2 episodes of sinus pauses up to 3.5 seconds (once during sleep at 30 bpm and the other occurred 8:37pm at 30 bpm). Palpitations correlated with NSR. Due to dizziness, metoprolol  dose was reduced. Amlodipine  was also reduced. 2D echo 2021 demonstrated severe asymmetric basal septal hypertrophy so cMRI was pursued 12/2019 showing no evidence of infiltrative heart disease, findings felt c/w hypertensive heart disease. Pulmonary HTN was previously reported on remote echo but not the case in more recent testing. She was admitted 04/2021 with syncope on the commode felt vasovagal. She also was found to have hypotension, UTI, AKI requiring hydration, antibiotics, and discontinuation of loartan. Echo felt consistent with prior. She underwent repeat outpatient coronary CTA showing mild nonobstructive CAD. Overread showed small hiatal hernia, aortic atherosclerosis and possible bronchomalacia for which Dr. Bihari recommended f/u PCP. F/u echo  03/2022 showed EF 60-65%, moderate asymmetric LVH BSS without LVOT gradient, normal PASP. Over the last few years it appears amlodipine  was eventually increased back to 10mg  daily and losartan  added back.   She was seen for routine f/u by Dr. Bihari 05/2023 and noted to be in new onset atrial fibrillation HR 1teens. She was also having increased chest pain/SOB so cardiac PET/echo were ordered. Metoprolol  was increased and she was started on Eliquis . She saw afib clinic 06/21/23 at which time she was back in NSR, therefore monitor was ordered. Echo 07/2023 showed EF 55-60%, moderate LVHBSS, G1DD, mild-moderate LAE without significant change. Monitor showed NSR, 28 brief PSVT (longest 9.6 sec), 2.7% PACs, rare PVCs, no afib. Dr. Bihari advised to continue metoprolol  and to get help with CPAP mask. Cardiac PET was low risk without ischemia, mild coronary calcification.   Mg 1.4  DOE, chest discomfort, mild CAD Paroxysmal atrial fibrillation, PACs LVH Hypomagnesemia  Labwork independently reviewed: 07/2023 CK wnl, pro bnp wnl, CBC wnl, Mg 1.4, K 4.3, Cr 0.91 05/2023 TSH OK, ALT 38, LDL 45, trig 96  ROS: .    Please see the history of present illness. Otherwise, review of systems is positive for ***.  All other systems are reviewed and otherwise negative.  Studies Reviewed: Nancy [name]    EKG:  EKG is ordered today, personally reviewed, demonstrating ***  CV Studies: Cardiac studies reviewed are outlined and summarized above. Otherwise please see EMR for full report.   Current Reported Medications:.    No outpatient medications have been marked as taking for the 11/10/23 encounter (Appointment) with Lucien Orren SAILOR, PA-C.    Physical Exam:    VS:  There were no vitals taken for this visit.   Wt Readings from  Last 3 Encounters:  11/02/23 171 lb 6.4 oz (77.7 kg)  09/09/23 174 lb (78.9 kg)  07/13/23 169 lb (76.7 kg)    GEN: Well nourished, well developed in no acute distress NECK: No JVD; No carotid  bruits CARDIAC: ***RRR, no murmurs, rubs, gallops RESPIRATORY:  Clear to auscultation without rales, wheezing or rhonchi  ABDOMEN: Soft, non-tender, non-distended EXTREMITIES:  No edema; No acute deformity   Asessement and Plan:.     ***     Disposition: F/u with ***  Signed, Orren LOISE Fabry, PA-C

## 2023-11-10 ENCOUNTER — Ambulatory Visit: Admitting: Physician Assistant

## 2023-11-10 DIAGNOSIS — I517 Cardiomegaly: Secondary | ICD-10-CM

## 2023-11-10 DIAGNOSIS — R079 Chest pain, unspecified: Secondary | ICD-10-CM

## 2023-11-10 DIAGNOSIS — R0600 Dyspnea, unspecified: Secondary | ICD-10-CM

## 2023-11-10 DIAGNOSIS — I480 Paroxysmal atrial fibrillation: Secondary | ICD-10-CM

## 2023-11-15 ENCOUNTER — Encounter: Payer: Self-pay | Admitting: Podiatry

## 2023-11-15 ENCOUNTER — Ambulatory Visit (INDEPENDENT_AMBULATORY_CARE_PROVIDER_SITE_OTHER): Admitting: Podiatry

## 2023-11-15 VITALS — BP 182/91 | HR 72 | Temp 96.8°F

## 2023-11-15 DIAGNOSIS — M79674 Pain in right toe(s): Secondary | ICD-10-CM

## 2023-11-15 DIAGNOSIS — M79675 Pain in left toe(s): Secondary | ICD-10-CM | POA: Diagnosis not present

## 2023-11-15 DIAGNOSIS — B351 Tinea unguium: Secondary | ICD-10-CM

## 2023-11-15 DIAGNOSIS — E1149 Type 2 diabetes mellitus with other diabetic neurological complication: Secondary | ICD-10-CM | POA: Diagnosis not present

## 2023-11-15 NOTE — Progress Notes (Signed)
 Subjective: Chief Complaint  Patient presents with   [facility]    Rm12 Diabetic foot care/ A72c 62.56    83 year old female presents for concerns of blood, elongated nails that she is not able to trim her self.  No spontaneous or drainage.  No open lesions.  She feels the neuropathy is about the same.  She was using amitriptyline and she has been getting a little more unsteady.  Objective:  AAO x3, NAD DP/PT pulses palpable bilaterally, CRT less than 3 seconds Sensation decreased with Semmes Weinstein monofilament Nails are hypertrophic, dystrophic, brittle, discolored, elongated 10. No surrounding redness or drainage. Tenderness nails 1-5 bilaterally. No open lesions or pre-ulcerative lesions are identified today. No significant pain otherwise today. No pain with calf compression, swelling, warmth, erythema  Assessment: 83 year old female with symptomatic onychomycosis, neuropathy  Plan: -All treatment options discussed with the patient including all alternatives, risks, complications.  -Sharply debrided the nails x 10 without any complications or bleeding -Discussed taking gabapentin  as prescribed.  She is also being prescribed amitriptyline.  She is feeling little unsteady.  Recommend to contact Dr. Emeline in order to discuss the side effects. She has not had any falls.  -Patient encouraged to call the office with any questions, concerns, change in symptoms.   Donnice JONELLE Fees DPM

## 2023-12-01 ENCOUNTER — Ambulatory Visit: Admitting: Orthopedic Surgery

## 2023-12-06 NOTE — Progress Notes (Deleted)
 Cardiology Office Note    Date:  10/12/2023  ID:  Nancy [name], DOB 1940/09/05, MRN [id number] PCP:  Campbell Reynolds, NP  Cardiologist:  Wilbert Bihari, MD  Electrophysiologist:  None   Chief Complaint: ***  History of Present Illness: Nancy [name] is a 83 y.o. female with visit-pertinent history of  hypertension, hypertensive heart disease, mild coronary atherosclerosis by cor CT, brief atrial tachycardia and sinus pauses, PACs, PVCs, PAF, sleep apnea on CPAP followed by Dr. Bihari, gastric ulcer, GERD, hyperlipidemia, neuropathy from diabetes, osteoarthritis, previous mild pulmonary HTN (resolved), spine deformity, chronic appearing anemia, chronic kidney disease stage 3a,who is seen for follow-up.   She has followed with cardiology for chest pain, SOB, palpitations, dizziness, and fatigue. Monitor 2019 showed HR 50-164bpm, occasional PACs/PVCs. Repeat monitor 07/2019 showed NSR, average heart rate 71 bpm, range 58-103, 1 brief atrial tachycardia. There were 2 episodes of sinus pauses up to 3.5 seconds (once during sleep at 30 bpm and the other occurred 8:37pm at 30 bpm). Palpitations correlated with NSR. Due to dizziness, metoprolol  dose was reduced. Amlodipine  was also reduced. 2D echo 2021 demonstrated severe asymmetric basal septal hypertrophy so cMRI was pursued 12/2019 showing no evidence of infiltrative heart disease, findings felt c/w hypertensive heart disease. Pulmonary HTN was previously reported on remote echo but not the case in more recent testing. She was admitted 04/2021 with syncope on the commode felt vasovagal. She also was found to have hypotension, UTI, AKI requiring hydration, antibiotics, and discontinuation of loartan. Echo felt consistent with prior. She underwent repeat outpatient coronary CTA showing mild nonobstructive CAD. Overread showed small hiatal hernia, aortic atherosclerosis and possible bronchomalacia for which Dr. Bihari recommended f/u PCP. F/u echo  03/2022 showed EF 60-65%, moderate asymmetric LVH BSS without LVOT gradient, normal PASP. Over the last few years it appears amlodipine  was eventually increased back to 10mg  daily and losartan  added back.   She was seen for routine f/u by Dr. Bihari 05/2023 and noted to be in new onset atrial fibrillation HR 1teens. She was also having increased chest pain/SOB so cardiac PET/echo were ordered. Metoprolol  was increased and she was started on Eliquis . She saw afib clinic 06/21/23 at which time she was back in NSR, therefore monitor was ordered. Echo 07/2023 showed EF 55-60%, moderate LVHBSS, G1DD, mild-moderate LAE without significant change. Monitor showed NSR, 28 brief PSVT (longest 9.6 sec), 2.7% PACs, rare PVCs, no afib. Dr. Bihari advised to continue metoprolol  and to get help with CPAP mask. Cardiac PET was low risk without ischemia, mild coronary calcification.   Mg 1.4  DOE, chest discomfort, mild CAD Paroxysmal atrial fibrillation, PACs LVH Hypomagnesemia  Labwork independently reviewed: 07/2023 CK wnl, pro bnp wnl, CBC wnl, Mg 1.4, K 4.3, Cr 0.91 05/2023 TSH OK, ALT 38, LDL 45, trig 96  ROS: .    Please see the history of present illness. Otherwise, review of systems is positive for ***.  All other systems are reviewed and otherwise negative.  Studies Reviewed: Nancy    EKG:  EKG is ordered today, personally reviewed, demonstrating ***  CV Studies: Cardiac studies reviewed are outlined and summarized above. Otherwise please see EMR for full report.   Current Reported Medications:.    No outpatient medications have been marked as taking for the 12/07/23 encounter (Appointment) with Lucien Orren SAILOR, PA-C.    Physical Exam:    VS:  There were no vitals taken for this visit.   Wt Readings from  Last 3 Encounters:  11/02/23 171 lb 6.4 oz (77.7 kg)  09/09/23 174 lb (78.9 kg)  07/13/23 169 lb (76.7 kg)    GEN: Well nourished, well developed in no acute distress NECK: No JVD; No carotid  bruits CARDIAC: ***RRR, no murmurs, rubs, gallops RESPIRATORY:  Clear to auscultation without rales, wheezing or rhonchi  ABDOMEN: Soft, non-tender, non-distended EXTREMITIES:  No edema; No acute deformity   Asessement and Plan:.     ***     Disposition: F/u with ***  Signed, Orren LOISE Fabry, PA-C

## 2023-12-07 ENCOUNTER — Ambulatory Visit: Attending: Physician Assistant | Admitting: Physician Assistant

## 2023-12-07 DIAGNOSIS — I480 Paroxysmal atrial fibrillation: Secondary | ICD-10-CM

## 2023-12-07 DIAGNOSIS — Z79899 Other long term (current) drug therapy: Secondary | ICD-10-CM

## 2023-12-07 DIAGNOSIS — I5032 Chronic diastolic (congestive) heart failure: Secondary | ICD-10-CM

## 2023-12-07 DIAGNOSIS — R0609 Other forms of dyspnea: Secondary | ICD-10-CM

## 2023-12-07 DIAGNOSIS — I10 Essential (primary) hypertension: Secondary | ICD-10-CM

## 2023-12-07 DIAGNOSIS — I517 Cardiomegaly: Secondary | ICD-10-CM

## 2023-12-07 DIAGNOSIS — I4891 Unspecified atrial fibrillation: Secondary | ICD-10-CM

## 2023-12-07 DIAGNOSIS — I272 Pulmonary hypertension, unspecified: Secondary | ICD-10-CM

## 2023-12-15 ENCOUNTER — Encounter: Admitting: Physical Medicine & Rehabilitation

## 2023-12-15 DIAGNOSIS — M5441 Lumbago with sciatica, right side: Secondary | ICD-10-CM | POA: Insufficient documentation

## 2023-12-15 DIAGNOSIS — E1142 Type 2 diabetes mellitus with diabetic polyneuropathy: Secondary | ICD-10-CM | POA: Insufficient documentation

## 2023-12-15 DIAGNOSIS — G8929 Other chronic pain: Secondary | ICD-10-CM | POA: Insufficient documentation

## 2023-12-15 DIAGNOSIS — G4700 Insomnia, unspecified: Secondary | ICD-10-CM | POA: Insufficient documentation

## 2023-12-15 DIAGNOSIS — G5603 Carpal tunnel syndrome, bilateral upper limbs: Secondary | ICD-10-CM | POA: Insufficient documentation

## 2023-12-28 ENCOUNTER — Ambulatory Visit: Admitting: Orthopedic Surgery

## 2024-01-24 ENCOUNTER — Encounter (HOSPITAL_COMMUNITY): Payer: Self-pay

## 2024-01-24 ENCOUNTER — Ambulatory Visit (HOSPITAL_COMMUNITY): Admitting: Internal Medicine

## 2024-02-02 ENCOUNTER — Ambulatory Visit (HOSPITAL_COMMUNITY): Admission: RE | Admit: 2024-02-02 | Source: Ambulatory Visit | Admitting: Internal Medicine

## 2024-02-02 NOTE — Progress Notes (Incomplete)
 "   Primary Care Physician: Campbell Reynolds, NP Primary Cardiologist: Wilbert Bihari, MD Electrophysiologist: None     Referring Physician: Dr. Bihari Quant Nancy [name] is a 84 y.o. female with a history of HTN, CAD, HLD, OSA not compliant with CPAP, and atrial fibrillation who presents for consultation in the [facility]. Seen by Dr. Bihari on 06/09/23 and noted to be in new Afib with RVR. Patient is on Eliquis  5 mg BID for a CHADS2VASC score of 5.  On evaluation today, she is currently in NSR. She admits to sometimes missing Eliquis  dose as well as other medications. She notes sometimes to forget taking her medications on a fairly regular basis. She does admit that she does not sleep well and has not been using her CPAP device. She notes to historically be tired a lot of the time, but states the SOB she experienced is now better.  Follow-up 02/02/2024.  Patient is currently in***.  She had an overall reassuring monitor placed last year which showed no significant A-fib burden.  She is taking Lopressor  50 mg twice daily.  No bleeding issues on Eliquis .  Today, she denies symptoms of palpitations, chest pain, orthopnea, PND, lower extremity edema, dizziness, presyncope, syncope, snoring, daytime somnolence, bleeding, or neurologic sequela. The patient is tolerating medications without difficulties and is otherwise without complaint today.    Atrial Fibrillation Risk Factors:  she does have symptoms or diagnosis of sleep apnea. she is not compliant with CPAP therapy.   she has a BMI of There is no height or weight on file to calculate BMI.. There were no vitals filed for this visit.   Current Outpatient Medications  Medication Sig Dispense Refill   allopurinol  (ZYLOPRIM ) 100 MG tablet Take 100 mg by mouth daily.     amitriptyline  (ELAVIL ) 25 MG tablet Take 1 tablet (25 mg total) by mouth at bedtime. 30 tablet 2   apixaban  (ELIQUIS ) 5 MG TABS tablet Take 1  tablet (5 mg total) by mouth 2 (two) times daily. 180 tablet 3   atorvastatin  (LIPITOR) 10 MG tablet Take 1 tablet by mouth once daily 90 tablet 3   Blood Glucose Monitoring Suppl (ONETOUCH VERIO IQ SYSTEM) w/Device KIT 1 kit by Does not apply route 2 (two) times daily. E11.65 1 kit 0   colchicine  0.6 MG tablet Take 0.6 mg by mouth every other day.     dicyclomine (BENTYL) 10 MG capsule SMARTSIG:1 Capsule(s) By Mouth PRN     furosemide  (LASIX ) 20 MG tablet Take 20 mg by mouth daily as needed.     gabapentin  (NEURONTIN ) 300 MG capsule Take 2 capsules (600 mg total) by mouth at bedtime. May also take 1 capsule (300 mg total) by mouth daily as needed. 540 capsule 0   glucose blood (CARESENS N GLUCOSE TEST) test strip 1 each by Other route daily in the afternoon. Use as instructed 100 each 3   losartan  (COZAAR ) 25 MG tablet Take 25 mg by mouth at bedtime.     metFORMIN  (GLUCOPHAGE ) 1000 MG tablet Take 1 tablet (1,000 mg total) by mouth daily with breakfast. 90 tablet 3   metoprolol  tartrate (LOPRESSOR ) 50 MG tablet Take 1 tablet (50 mg total) by mouth 2 (two) times daily. 180 tablet 3   Multiple Vitamins tablet Take 1 tablet by mouth daily.     omeprazole (PRILOSEC) 20 MG capsule Take 20 mg by mouth daily as needed (acid reflux).     ondansetron  (  ZOFRAN -ODT) 4 MG disintegrating tablet Take 1 tablet (4 mg total) by mouth every 8 (eight) hours as needed for nausea or vomiting. 20 tablet 0   phenazopyridine  (PYRIDIUM ) 200 MG tablet Take 1 tablet (200 mg total) by mouth 3 (three) times daily. 6 tablet 0   Polyethyl Glycol-Propyl Glycol (SYSTANE) 0.4-0.3 % SOLN 1 drop into affected eye as needed Ophthalmic QD     polyethylene glycol (MIRALAX  / GLYCOLAX ) 17 g packet Take 17 g by mouth 2 (two) times daily. 14 each 0   Semaglutide ,0.25 or 0.5MG /DOS, 2 MG/3ML SOPN Inject 0.5 mg into the skin once a week. 9 mL 3   senna-docusate (SENOKOT-S) 8.6-50 MG tablet Take 1 tablet by mouth 2 (two) times daily. 30 tablet 0    No current facility-administered medications for this visit.    Atrial Fibrillation Management history:  Previous antiarrhythmic drugs: none Previous cardioversions: none Previous ablations: none Anticoagulation history: Eliquis    ROS- All systems are reviewed and negative except as per the HPI above.  Physical Exam: There were no vitals taken for this visit.  GEN- The patient is well appearing, alert and oriented x 3 today.   Neck - no JVD or carotid bruit noted Lungs- Clear to ausculation bilaterally, normal work of breathing Heart- ***Regular rate and rhythm, no murmurs, rubs or gallops, PMI not laterally displaced Extremities- no clubbing, cyanosis, or edema Skin - no rash or ecchymosis noted   EKG today demonstrates  ***  Echo 04/08/22 demonstrated  1. No LVOT obstruction. Left ventricular ejection fraction, by  estimation, is 60 to 65%. The left ventricle has normal function. The left  ventricle has no regional wall motion abnormalities. There is moderate  asymmetric left ventricular hypertrophy of  the basal-septal segment. Left ventricular diastolic parameters are  consistent with Grade I diastolic dysfunction (impaired relaxation).   2. Right ventricular systolic function is normal. The right ventricular  size is normal. There is normal pulmonary artery systolic pressure.   3. No evidence of mitral valve regurgitation.   4. Aortic valve regurgitation is not visualized.   5. The inferior vena cava is normal in size with greater than 50%  respiratory variability, suggesting right atrial pressure of 3 mmHg.    ASSESSMENT & PLAN CHA2DS2-VASc Score = 5  The patient's score is based upon: CHF History: 0 HTN History: 1 Diabetes History: 0 Stroke History: 0 Vascular Disease History: 1 Age Score: 2 Gender Score: 1       ASSESSMENT AND PLAN: Paroxysmal Atrial Fibrillation (ICD10:  I48.0) The patient's CHA2DS2-VASc score is 5, indicating a 7.2% annual risk of  stroke.    Patient is currently in***.  Continue Lopressor  50 mg twice daily.  Secondary Hypercoagulable State (ICD10:  D68.69) The patient is at significant risk for stroke/thromboembolism based upon her CHA2DS2-VASc Score of 5.  Continue Apixaban  (Eliquis ).  Continue Eliquis  5 mg twice daily.   Follow up ***.    Terra Pac, PA-C  [facility] [address] [place], [place] [address] [phone]  "

## 2024-02-14 ENCOUNTER — Encounter (HOSPITAL_COMMUNITY): Payer: Self-pay | Admitting: Internal Medicine

## 2024-02-14 ENCOUNTER — Ambulatory Visit (HOSPITAL_COMMUNITY)
Admission: RE | Admit: 2024-02-14 | Discharge: 2024-02-14 | Disposition: A | Source: Ambulatory Visit | Attending: Internal Medicine | Admitting: Internal Medicine

## 2024-02-14 VITALS — BP 144/90 | HR 79 | Ht 62.0 in | Wt 169.8 lb

## 2024-02-14 DIAGNOSIS — D6869 Other thrombophilia: Secondary | ICD-10-CM

## 2024-02-14 DIAGNOSIS — I480 Paroxysmal atrial fibrillation: Secondary | ICD-10-CM | POA: Diagnosis not present

## 2024-02-16 ENCOUNTER — Ambulatory Visit (HOSPITAL_COMMUNITY): Payer: Self-pay | Admitting: Internal Medicine

## 2024-02-16 ENCOUNTER — Ambulatory Visit: Admitting: Podiatry

## 2024-02-16 ENCOUNTER — Other Ambulatory Visit (HOSPITAL_COMMUNITY): Payer: Self-pay | Admitting: *Deleted

## 2024-02-16 DIAGNOSIS — I447 Left bundle-branch block, unspecified: Secondary | ICD-10-CM

## 2024-02-16 DIAGNOSIS — I480 Paroxysmal atrial fibrillation: Secondary | ICD-10-CM

## 2024-02-16 DIAGNOSIS — B351 Tinea unguium: Secondary | ICD-10-CM

## 2024-02-16 DIAGNOSIS — R2681 Unsteadiness on feet: Secondary | ICD-10-CM

## 2024-02-16 DIAGNOSIS — E1149 Type 2 diabetes mellitus with other diabetic neurological complication: Secondary | ICD-10-CM

## 2024-02-16 DIAGNOSIS — M792 Neuralgia and neuritis, unspecified: Secondary | ICD-10-CM

## 2024-02-16 DIAGNOSIS — M79674 Pain in right toe(s): Secondary | ICD-10-CM

## 2024-02-16 LAB — CBC
Hematocrit: 38.6 % (ref 34.0–46.6)
Hemoglobin: 12.6 g/dL (ref 11.1–15.9)
MCH: 29.4 pg (ref 26.6–33.0)
MCHC: 32.6 g/dL (ref 31.5–35.7)
MCV: 90 fL (ref 79–97)
Platelets: 211 10*3/uL (ref 150–450)
RBC: 4.28 x10E6/uL (ref 3.77–5.28)
RDW: 13.3 % (ref 11.7–15.4)
WBC: 6.5 10*3/uL (ref 3.4–10.8)

## 2024-02-16 LAB — BASIC METABOLIC PANEL WITH GFR
BUN/Creatinine Ratio: 15 (ref 12–28)
BUN: 15 mg/dL (ref 8–27)
CO2: 23 mmol/L (ref 20–29)
Calcium: 10 mg/dL (ref 8.7–10.3)
Chloride: 99 mmol/L (ref 96–106)
Creatinine, Ser: 0.97 mg/dL (ref 0.57–1.00)
Glucose: 142 mg/dL — ABNORMAL HIGH (ref 70–99)
Potassium: 4.1 mmol/L (ref 3.5–5.2)
Sodium: 138 mmol/L (ref 134–144)
eGFR: 59 mL/min/{1.73_m2} — ABNORMAL LOW

## 2024-02-16 LAB — BRAIN NATRIURETIC PEPTIDE: BNP: 51.5 pg/mL (ref 0.0–100.0)

## 2024-02-16 LAB — TSH: TSH: 3.44 u[IU]/mL (ref 0.450–4.500)

## 2024-02-16 NOTE — Progress Notes (Unsigned)
 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 Patient reports experiencing tingling and numbness in both feet.    84 year old female presents for concerns of blood, elongated nails that she is not able to trim her self.  No spontaneous or drainage.  No open lesions.  She feels the neuropathy is about the same.  She was using amitriptyline  and she has been getting a little more unsteady.  Objective:  AAO x3, NAD DP/PT pulses palpable bilaterally, CRT less than 3 seconds Sensation decreased with Semmes Weinstein monofilament Nails are hypertrophic, dystrophic, brittle, discolored, elongated 10. No surrounding redness or drainage. Tenderness nails 1-5 bilaterally. No open lesions or pre-ulcerative lesions are identified today. No significant pain otherwise today. No pain with calf compression, swelling, warmth, erythema  Assessment: 84 year old female with symptomatic onychomycosis, neuropathy  Plan: -All treatment options discussed with the patient including all alternatives, risks, complications.  -Sharply debrided the nails x 10 without any complications or bleeding  Neuropathy  -Discussed taking gabapentin  as prescribed.  She is also being prescribed amitriptyline .  She is feeling little unsteady.  Recommend to contact Dr. Emeline in order to discuss the side effects. She has not had any falls.  -Patient encouraged to call the office with any questions, concerns, change in symptoms.   Nancy [name] DPM

## 2024-03-14 ENCOUNTER — Ambulatory Visit: Admitting: Internal Medicine

## 2024-05-17 ENCOUNTER — Ambulatory Visit: Admitting: Podiatry
